# Patient Record
Sex: Female | Born: 1945 | Race: White | Hispanic: No | Marital: Married | Smoking: Former smoker
Health system: Southern US, Community
[De-identification: ages and names within clinical notes are randomized; demographics above are authoritative.]

## PROBLEM LIST (undated history)

## (undated) DIAGNOSIS — I739 Peripheral vascular disease, unspecified: Secondary | ICD-10-CM

## (undated) DIAGNOSIS — K649 Unspecified hemorrhoids: Secondary | ICD-10-CM

## (undated) DIAGNOSIS — G63 Polyneuropathy in diseases classified elsewhere: Secondary | ICD-10-CM

## (undated) DIAGNOSIS — E1140 Type 2 diabetes mellitus with diabetic neuropathy, unspecified: Secondary | ICD-10-CM

## (undated) DIAGNOSIS — T4145XA Adverse effect of unspecified anesthetic, initial encounter: Secondary | ICD-10-CM

## (undated) DIAGNOSIS — H3500 Unspecified background retinopathy: Secondary | ICD-10-CM

## (undated) DIAGNOSIS — F419 Anxiety disorder, unspecified: Secondary | ICD-10-CM

## (undated) DIAGNOSIS — G459 Transient cerebral ischemic attack, unspecified: Secondary | ICD-10-CM

## (undated) DIAGNOSIS — K449 Diaphragmatic hernia without obstruction or gangrene: Secondary | ICD-10-CM

## (undated) DIAGNOSIS — R251 Tremor, unspecified: Secondary | ICD-10-CM

## (undated) DIAGNOSIS — Z780 Asymptomatic menopausal state: Secondary | ICD-10-CM

## (undated) DIAGNOSIS — K222 Esophageal obstruction: Secondary | ICD-10-CM

## (undated) DIAGNOSIS — E119 Type 2 diabetes mellitus without complications: Secondary | ICD-10-CM

## (undated) DIAGNOSIS — K5790 Diverticulosis of intestine, part unspecified, without perforation or abscess without bleeding: Secondary | ICD-10-CM

## (undated) DIAGNOSIS — T7840XA Allergy, unspecified, initial encounter: Secondary | ICD-10-CM

## (undated) DIAGNOSIS — E785 Hyperlipidemia, unspecified: Secondary | ICD-10-CM

## (undated) DIAGNOSIS — M1990 Unspecified osteoarthritis, unspecified site: Secondary | ICD-10-CM

## (undated) DIAGNOSIS — K635 Polyp of colon: Secondary | ICD-10-CM

## (undated) DIAGNOSIS — T8859XA Other complications of anesthesia, initial encounter: Secondary | ICD-10-CM

## (undated) DIAGNOSIS — R200 Anesthesia of skin: Secondary | ICD-10-CM

## (undated) DIAGNOSIS — K219 Gastro-esophageal reflux disease without esophagitis: Secondary | ICD-10-CM

## (undated) DIAGNOSIS — F32A Depression, unspecified: Secondary | ICD-10-CM

## (undated) DIAGNOSIS — E559 Vitamin D deficiency, unspecified: Secondary | ICD-10-CM

## (undated) DIAGNOSIS — M797 Fibromyalgia: Secondary | ICD-10-CM

## (undated) DIAGNOSIS — I10 Essential (primary) hypertension: Secondary | ICD-10-CM

## (undated) DIAGNOSIS — F329 Major depressive disorder, single episode, unspecified: Secondary | ICD-10-CM

## (undated) HISTORY — DX: Anesthesia of skin: R20.0

## (undated) HISTORY — DX: Unspecified hemorrhoids: K64.9

## (undated) HISTORY — DX: Peripheral vascular disease, unspecified: I73.9

## (undated) HISTORY — DX: Diaphragmatic hernia without obstruction or gangrene: K44.9

## (undated) HISTORY — DX: Unspecified osteoarthritis, unspecified site: M19.90

## (undated) HISTORY — PX: BACK SURGERY: SHX140

## (undated) HISTORY — DX: Vitamin D deficiency, unspecified: E55.9

## (undated) HISTORY — DX: Allergy, unspecified, initial encounter: T78.40XA

## (undated) HISTORY — DX: Polyp of colon: K63.5

## (undated) HISTORY — DX: Essential (primary) hypertension: I10

## (undated) HISTORY — DX: Hyperlipidemia, unspecified: E78.5

## (undated) HISTORY — PX: COLONOSCOPY: SHX174

## (undated) HISTORY — PX: ANKLE SURGERY: SHX546

## (undated) HISTORY — DX: Depression, unspecified: F32.A

## (undated) HISTORY — PX: JOINT REPLACEMENT: SHX530

## (undated) HISTORY — DX: Gastro-esophageal reflux disease without esophagitis: K21.9

## (undated) HISTORY — DX: Asymptomatic menopausal state: Z78.0

## (undated) HISTORY — DX: Diverticulosis of intestine, part unspecified, without perforation or abscess without bleeding: K57.90

## (undated) HISTORY — DX: Other complications of anesthesia, initial encounter: T88.59XA

## (undated) HISTORY — PX: ABDOMINAL HYSTERECTOMY: SHX81

## (undated) HISTORY — DX: Transient cerebral ischemic attack, unspecified: G45.9

## (undated) HISTORY — DX: Tremor, unspecified: R25.1

## (undated) HISTORY — DX: Type 2 diabetes mellitus without complications: E11.9

## (undated) HISTORY — PX: ELBOW SURGERY: SHX618

## (undated) HISTORY — PX: TOTAL KNEE ARTHROPLASTY: SHX125

## (undated) HISTORY — DX: Fibromyalgia: M79.7

## (undated) HISTORY — PX: REPLACEMENT TOTAL KNEE: SUR1224

## (undated) HISTORY — DX: Major depressive disorder, single episode, unspecified: F32.9

## (undated) HISTORY — DX: Esophageal obstruction: K22.2

---
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
## 1979-04-07 HISTORY — PX: VESICOVAGINAL FISTULA CLOSURE W/ TAH: SUR271

## 1989-05-07 ENCOUNTER — Encounter (INDEPENDENT_AMBULATORY_CARE_PROVIDER_SITE_OTHER): Payer: Self-pay | Admitting: *Deleted

## 1994-09-21 ENCOUNTER — Encounter: Payer: Self-pay | Admitting: Internal Medicine

## 1997-02-05 ENCOUNTER — Encounter: Payer: Self-pay | Admitting: Internal Medicine

## 1997-11-06 ENCOUNTER — Ambulatory Visit (HOSPITAL_COMMUNITY): Admission: RE | Admit: 1997-11-06 | Discharge: 1997-11-06 | Payer: Self-pay | Admitting: Family Medicine

## 1998-05-23 ENCOUNTER — Encounter: Payer: Self-pay | Admitting: Internal Medicine

## 1998-05-23 ENCOUNTER — Ambulatory Visit (HOSPITAL_COMMUNITY): Admission: RE | Admit: 1998-05-23 | Discharge: 1998-05-23 | Payer: Self-pay | Admitting: Internal Medicine

## 1998-08-14 ENCOUNTER — Inpatient Hospital Stay (HOSPITAL_COMMUNITY): Admission: AD | Admit: 1998-08-14 | Discharge: 1998-08-15 | Payer: Self-pay | Admitting: Cardiology

## 1998-08-15 ENCOUNTER — Encounter: Payer: Self-pay | Admitting: Cardiology

## 1998-09-05 ENCOUNTER — Encounter (INDEPENDENT_AMBULATORY_CARE_PROVIDER_SITE_OTHER): Payer: Self-pay | Admitting: *Deleted

## 1998-09-05 ENCOUNTER — Ambulatory Visit (HOSPITAL_COMMUNITY): Admission: RE | Admit: 1998-09-05 | Discharge: 1998-09-05 | Payer: Self-pay | Admitting: Gastroenterology

## 1998-10-01 ENCOUNTER — Ambulatory Visit (HOSPITAL_COMMUNITY): Admission: RE | Admit: 1998-10-01 | Discharge: 1998-10-01 | Payer: Self-pay | Admitting: Gastroenterology

## 1998-10-14 ENCOUNTER — Encounter (INDEPENDENT_AMBULATORY_CARE_PROVIDER_SITE_OTHER): Payer: Self-pay | Admitting: *Deleted

## 1998-10-22 ENCOUNTER — Encounter (INDEPENDENT_AMBULATORY_CARE_PROVIDER_SITE_OTHER): Payer: Self-pay | Admitting: *Deleted

## 1998-11-07 ENCOUNTER — Encounter: Admission: RE | Admit: 1998-11-07 | Discharge: 1998-11-20 | Payer: Self-pay | Admitting: *Deleted

## 1999-05-21 ENCOUNTER — Encounter: Admission: RE | Admit: 1999-05-21 | Discharge: 1999-07-03 | Payer: Self-pay | Admitting: Family Medicine

## 1999-07-14 ENCOUNTER — Ambulatory Visit (HOSPITAL_COMMUNITY): Admission: RE | Admit: 1999-07-14 | Discharge: 1999-07-14 | Payer: Self-pay | Admitting: Orthopaedic Surgery

## 1999-07-14 ENCOUNTER — Encounter: Payer: Self-pay | Admitting: Orthopaedic Surgery

## 1999-11-26 ENCOUNTER — Other Ambulatory Visit: Admission: RE | Admit: 1999-11-26 | Discharge: 1999-11-26 | Payer: Self-pay | Admitting: Family Medicine

## 2000-05-04 ENCOUNTER — Emergency Department (HOSPITAL_COMMUNITY): Admission: EM | Admit: 2000-05-04 | Discharge: 2000-05-04 | Payer: Self-pay | Admitting: Emergency Medicine

## 2001-01-19 ENCOUNTER — Encounter: Admission: RE | Admit: 2001-01-19 | Discharge: 2001-04-19 | Payer: Self-pay | Admitting: Orthopaedic Surgery

## 2001-04-06 HISTORY — PX: REFRACTIVE SURGERY: SHX103

## 2002-04-17 ENCOUNTER — Encounter: Payer: Self-pay | Admitting: Internal Medicine

## 2002-11-09 ENCOUNTER — Ambulatory Visit (HOSPITAL_COMMUNITY): Admission: RE | Admit: 2002-11-09 | Discharge: 2002-11-09 | Payer: Self-pay | Admitting: Family Medicine

## 2003-01-09 ENCOUNTER — Encounter: Payer: Self-pay | Admitting: Orthopedic Surgery

## 2003-01-15 ENCOUNTER — Inpatient Hospital Stay (HOSPITAL_COMMUNITY): Admission: RE | Admit: 2003-01-15 | Discharge: 2003-01-19 | Payer: Self-pay | Admitting: Orthopedic Surgery

## 2003-02-06 ENCOUNTER — Encounter: Admission: RE | Admit: 2003-02-06 | Discharge: 2003-03-22 | Payer: Self-pay | Admitting: Orthopedic Surgery

## 2003-02-14 ENCOUNTER — Encounter (INDEPENDENT_AMBULATORY_CARE_PROVIDER_SITE_OTHER): Payer: Self-pay | Admitting: *Deleted

## 2003-03-12 ENCOUNTER — Other Ambulatory Visit: Admission: RE | Admit: 2003-03-12 | Discharge: 2003-03-12 | Payer: Self-pay | Admitting: *Deleted

## 2003-03-23 ENCOUNTER — Encounter: Admission: RE | Admit: 2003-03-23 | Discharge: 2003-03-23 | Payer: Self-pay | Admitting: Family Medicine

## 2003-05-28 ENCOUNTER — Encounter: Payer: Self-pay | Admitting: Internal Medicine

## 2003-09-27 ENCOUNTER — Emergency Department (HOSPITAL_COMMUNITY): Admission: EM | Admit: 2003-09-27 | Discharge: 2003-09-27 | Payer: Self-pay | Admitting: Emergency Medicine

## 2003-10-01 ENCOUNTER — Encounter: Payer: Self-pay | Admitting: Internal Medicine

## 2006-01-05 ENCOUNTER — Encounter: Admission: RE | Admit: 2006-01-05 | Discharge: 2006-02-04 | Payer: Self-pay | Admitting: Family Medicine

## 2006-08-09 ENCOUNTER — Encounter: Admission: RE | Admit: 2006-08-09 | Discharge: 2006-11-02 | Payer: Self-pay | Admitting: Orthopedic Surgery

## 2008-01-09 ENCOUNTER — Inpatient Hospital Stay (HOSPITAL_COMMUNITY): Admission: RE | Admit: 2008-01-09 | Discharge: 2008-01-12 | Payer: Self-pay | Admitting: Orthopedic Surgery

## 2008-01-12 ENCOUNTER — Encounter (INDEPENDENT_AMBULATORY_CARE_PROVIDER_SITE_OTHER): Payer: Self-pay | Admitting: *Deleted

## 2008-02-01 ENCOUNTER — Encounter: Admission: RE | Admit: 2008-02-01 | Discharge: 2008-02-15 | Payer: Self-pay | Admitting: Orthopedic Surgery

## 2008-05-25 ENCOUNTER — Encounter (INDEPENDENT_AMBULATORY_CARE_PROVIDER_SITE_OTHER): Payer: Self-pay | Admitting: *Deleted

## 2008-10-01 ENCOUNTER — Encounter: Admission: RE | Admit: 2008-10-01 | Discharge: 2008-12-30 | Payer: Self-pay | Admitting: Family Medicine

## 2009-08-29 ENCOUNTER — Telehealth: Payer: Self-pay | Admitting: Internal Medicine

## 2009-10-09 ENCOUNTER — Ambulatory Visit: Payer: Self-pay | Admitting: Internal Medicine

## 2009-10-09 ENCOUNTER — Telehealth: Payer: Self-pay | Admitting: Internal Medicine

## 2009-10-11 ENCOUNTER — Ambulatory Visit: Payer: Self-pay | Admitting: Internal Medicine

## 2009-10-11 DIAGNOSIS — K219 Gastro-esophageal reflux disease without esophagitis: Secondary | ICD-10-CM | POA: Insufficient documentation

## 2009-10-11 DIAGNOSIS — Z860100 Personal history of colon polyps, unspecified: Secondary | ICD-10-CM | POA: Insufficient documentation

## 2009-10-11 DIAGNOSIS — D692 Other nonthrombocytopenic purpura: Secondary | ICD-10-CM | POA: Insufficient documentation

## 2009-10-11 DIAGNOSIS — R1319 Other dysphagia: Secondary | ICD-10-CM | POA: Insufficient documentation

## 2009-10-11 LAB — CONVERTED CEMR LAB
Basophils Relative: 1.5 % (ref 0.0–3.0)
Eosinophils Absolute: 0.1 10*3/uL (ref 0.0–0.7)
Hemoglobin: 14.7 g/dL (ref 12.0–15.0)
MCHC: 34 g/dL (ref 30.0–36.0)
MCV: 94.6 fL (ref 78.0–100.0)
Monocytes Relative: 11.5 % (ref 3.0–12.0)
Neutrophils Relative %: 51.7 % (ref 43.0–77.0)
RBC: 4.55 M/uL (ref 3.87–5.11)

## 2009-10-29 ENCOUNTER — Encounter: Payer: Self-pay | Admitting: Internal Medicine

## 2009-11-27 ENCOUNTER — Ambulatory Visit: Payer: Self-pay | Admitting: Internal Medicine

## 2009-12-01 ENCOUNTER — Encounter: Payer: Self-pay | Admitting: Internal Medicine

## 2010-05-06 NOTE — Procedures (Signed)
Summary: Esophageal Manometry/[place]  Esophageal Manometry/[place]   Imported By: Sherian Rein 10/16/2009 15:17:34  _____________________________________________________________________  External Attachment:    Type:   Image     Comment:   External Document

## 2010-05-06 NOTE — Procedures (Signed)
Summary: Colonoscopy   Colonoscopy  Procedure date:  05/28/2003  Findings:      Location:  [facility].  Colon Polyp-no path specimen destroyed upon removal.  Procedures Next Due Date:    Colonoscopy: 06/2008  Colonoscopy  Procedure date:  05/28/2003  Findings:      Location:  [facility].  Colon Polyp-no path specimen destroyed upon removal.  Procedures Next Due Date:    Colonoscopy: 06/2008 Patient Name: Haas, Mary MRN:  Procedure Procedures: Colonoscopy CPT: 16109.    with polypectomy. CPT: A3573898.  Personnel: Endoscopist: Wilhemina Bonito. Marina Goodell, MD.  Exam Location: Exam performed in Outpatient Clinic. Outpatient  Patient Consent: Procedure, Alternatives, Risks and Benefits discussed, consent obtained, from patient. Consent was obtained by the RN.  Indications  Average Risk Screening Routine.  History  Current Medications: Patient is not currently taking Coumadin.  Pre-Exam Physical: Performed May 28, 2003. Entire physical exam was normal.  Exam Exam: Extent of exam reached: Cecum, extent intended: Cecum.  The cecum was identified by appendiceal orifice and IC valve. Patient position: on left side. Colon retroflexion performed. Images taken. ASA Classification: II. Tolerance: excellent.  Monitoring: Pulse and BP monitoring, Oximetry used. Supplemental O2 given.  Colon Prep Used MIRALAX for colon prep. Prep results: excellent.  Sedation Meds: Patient assessed and found to be appropriate for moderate (conscious) sedation. Fentanyl 150 mcg. given IV. Versed 15 given IV.  Findings NORMAL EXAM: Cecum to Rectum.  POLYP: Sigmoid Colon, diminutive, sessile polyp. Procedure:  snare without cautery, removed, ICD9: Colon Polyps: 211.3. Comments: POLYP DESTROYED UPON REMOVAL.   Assessment Abnormal examination, see findings above.  Diagnoses: 211.3: Colon Polyps.   Events  Unplanned Interventions: No intervention was required.    Unplanned Events: There were no complications. Plans Disposition: After procedure patient sent to recovery. After recovery patient sent home.  Scheduling/Referral: Colonoscopy, to Wilhemina Bonito. Marina Goodell, MD, IN ABOUT 5 YEARS,    This report was created from the original endoscopy report, which was reviewed and signed by the above listed endoscopist.   cc:  Rudi Heap, MD      The Patient

## 2010-05-06 NOTE — Procedures (Signed)
Summary: Colonoscopy  Patient: [name] Note: All result statuses are Final unless otherwise noted.  Tests: (1) Colonoscopy (COL)   COL Colonoscopy           DONE     [facility]     [address]     [place], [place]  [address]           COLONOSCOPY PROCEDURE REPORT           PATIENT:  [name], [name]  MR#:  [id number]     BIRTHDATE:  July 16, 1945, 64 yrs. old  GENDER:  female     ENDOSCOPIST:  Wilhemina Bonito. Eda Keys, MD     REF. BY:  Surveillance Program Recall,     PROCEDURE DATE:  11/27/2009     PROCEDURE:  Colonoscopy with snare polypectomy x 6     ASA CLASS:  Class II     INDICATIONS:  history of polyps, surveillance and high-risk     screening ; index exam 2005 (NAP)     MEDICATIONS:   Fentanyl 100 mcg IV, Versed 10 mg IV, Benadryl 50     mg IV           DESCRIPTION OF PROCEDURE:   After the risks benefits and     alternatives of the procedure were thoroughly explained, informed     consent was obtained.  Digital rectal exam was performed and     revealed no abnormalities.   The LB [id number]-[id number] endoscope     was introduced through the anus and advanced to the cecum, which     was identified by both the appendix and ileocecal valve, without     limitations.Time to cecum = 2:68min.  The quality of the prep was     excellent, using MiraLax.  The instrument was then slowly     withdrawn (time = 25:51 min) as the colon was fully examined.     <<PROCEDUREIMAGES>>           FINDINGS:  There were multiple (6), all < 6mm,  polyps identified     and removed in the cecum (3) and ascending colon (3). Polyps were     snared without cautery. Retrieval was successful.  This was     otherwise a normal examination of the colon.   Retroflexed views     in the rectum revealed internal hemorrhoids.    The scope was then     withdrawn from the patient and the procedure completed.           COMPLICATIONS:  None           ENDOSCOPIC IMPRESSION:     1) Polyps, multiple (6) -  removed     2) Otherwise normal examination     3) Internal hemorrhoids           RECOMMENDATIONS:     1) Follow up colonoscopy in 3 years           ______________________________     Wilhemina Bonito. Eda Keys, MD           CC:  Rudi Heap, MD;The Patient           n.     Rosalie DoctorWilhemina Bonito. Eda Keys at 11/27/2009 11:01 AM           Ashok Norris, [id number]  Note: An exclamation mark (!) indicates a result that was not dispersed into the flowsheet. Document Creation Date: 11/27/2009 12:20  PM _______________________________________________________________________  (1) Order result status: Final Collection or observation date-time: 11/27/2009 10:50 Requested date-time:  Receipt date-time:  Reported date-time:  Referring Physician:   Ordering Physician: Fransico Setters [phone]) Specimen Source:  Source: Launa Grill Order Number: [id number] Lab site:   Appended Document: Colonoscopy recall     Procedures Next Due Date:    Colonoscopy: 12/2012

## 2010-05-06 NOTE — Discharge Summary (Signed)
Summary: Discharge Summary    NAME:  [name], [name] NO.:  [id number]   MEDICAL RECORD NO.:  [id number]                   PATIENT TYPE:  INP   LOCATION:  0455                                 FACILITY:  [facility]   PHYSICIAN:  Ollen Gross, M.D.                 DATE OF BIRTH:  Apr 21, 1945   DATE OF ADMISSION:  01/15/2003  DATE OF DISCHARGE:  01/19/2003                                 DISCHARGE SUMMARY   ADMITTING DIAGNOSES:  1. Osteoarthritis right knee.  2. Depression.  3. History of bronchitis.  4. Hypertension.  5. Hiatal hernia.  6. Reflux disease.  7. Hemorrhoids.  8. Irritable bowel syndrome.  9. Cervical degenerative disk disease.  10.      History of borderline diabetes currently under work-up.  11.      Surgical menopause.  12.      Endometriosis.   DISCHARGE DIAGNOSES:  1. Osteoarthritis right knee status post right total knee arthroplasty.  2. Mild postoperative blood loss anemia.  3. Hyponatremia.  4. Depression.  5. History of bronchitis.  6. Hypertension.  7. Hiatal hernia.  8. Reflux disease.  9. Hemorrhoids.  10.      Irritable bowel syndrome.  11.      Cervical degenerative disk disease.  12.      History of borderline diabetes currently under work-up.  13.      Surgical menopause.  14.      Endometriosis.   PROCEDURE:  The patient was taken to the OR on January 15, 2003.  Underwent  a right total knee arthroplasty.  Surgeon Ollen Gross, M.D.  Assistant  Alexzandrew L. Julien Girt, P.A.  Anesthesia spinal.  Minimal blood loss.  Hemovac drain x1.  Tourniquet time 52 minutes at 300 mmHg.   CONSULTS:  None.   BRIEF HISTORY:  The patient is a 65 year old female with a significant end-  stage arthritis of the right knee.  This has been refractory to nonoperative  management in the past.  It is felt she has reached a point where she would  benefit from undergoing knee replacement.  Risks and benefits discussed.  The patient is  subsequently admitted to the hospital.   LABORATORY DATA:  CBC on admission showed a hemoglobin of 15.1, hematocrit  of 43.1, white cell count normal at 7.8, red cell count 4.78.  Differential  all within normal limits.  Postoperative H&H 12.7 and 37.3.  Last noted H&H  11.1 and 32.3.  PT/PTT on admission were 11.9 and 29, respectively with INR  0.8.  Serial pro times followed.  Last noted PT/INR 15.4 and 1.4.  Chemistry  panel on admission showed mild low potassium at 3.4, elevated glucose of  172, minimally elevated ALT of 42.  Remaining liver functions, ALP, AST all  within normal limits.  Serial BMETs were followed.  Potassium came back up  to 4.1.  Sodium did drop a little bit from 138 down to 131.  Glucose  improved down to 145.  Urinalysis on admission negative.  Blood group type  A+.  Chest x-ray dated January 09, 2003:  No evidence of active disease in  the chest.  There is an EKG report on the chart dated January 19, 2002.  Ventricular rate 64, within normal limits.  It was sent over from [facility].   HOSPITAL COURSE:  The patient was admitted to [facility], taken to  OR.  Underwent above stated procedure without complication.  The patient  tolerated procedure well.  Later transferred to recovery room and then to  the orthopedic floor for continued postoperative care.  Vital signs were  followed.  The patient was given 24 hours of postoperative IV Ancef,  Coumadin for DVT prophylaxis.  Placed on PCA and p.o. analgesia for pain  control following surgery.  PT and OT were consulted to assist with gait  training, ambulation, ADLs.  Placed weightbearing as tolerated.  Hemovac  drain placed at time of surgery was pulled on postoperative day one without  difficulty.  She was found to have a pillow under the knee on postoperative  day one.  Reiterated to the staff that she was not to have a pillow under  the knee, that she may have it under the foot  for elevation, but not under  the knee to prevent flexion contracture.  Laboratories were followed very  closely.  She was encouraged to take p.o. medications along with PCA.  By  day two she had had a rough night, but was feeling better the next day.  She  was weaned over to p.o. medications and the PCA and IVs were discontinued  along with Foley.  From a therapy standpoint patient was starting to show  progress with her PT.  She had been up ambulating 40 feet on day one and  then increased up to 200 feet by day two with 280 feet by day three.  She  did very well with her physical therapy.  Dressing change initiated on  postoperative day two.  Incision was healing well.  She continued to  progress well.  It was felt she would be going home later that week.  By  January 19, 2003 she had been up ambulating well with physical therapy,  tolerating her p.o. medications, was seen in rounds by Ollen Gross, M.D.  who decided patient would be discharged home at that time.   DISCHARGE PLAN:  The patient discharged home on January 19, 2003.   DISCHARGE DIAGNOSES:  Please see above.   DISCHARGE MEDICATIONS:  1. Percocet.  2. Robaxin.  3. Restoril.  4. Coumadin.   DIET:  As tolerated.   ACTIVITY:  Gait training, ambulation.  Home health PT and home health  nursing.  Weightbearing as tolerated.   FOLLOWUP:  Two weeks from surgery.    DISPOSITION:  Home.   CONDITION ON DISCHARGE:  Improved.     Alexzandrew L. Julien Girt, P.A.              Ollen Gross, M.D.    ALP/MEDQ  D:  02/14/2003  T:  02/14/2003  Job:  [id number]   cc:   Ernestina Penna, M.D.  [address] [place]  [place] [address]  Fax: [phone]   Magnus Sinning. Dimple Casey, M.D.  [address] [place]  [place] [address]  Fax: [phone]

## 2010-05-06 NOTE — Procedures (Signed)
Summary: [facility]   Imported By: Sherian Rein 10/16/2009 15:15:10  _____________________________________________________________________  External Attachment:    Type:   Image     Comment:   External Document

## 2010-05-06 NOTE — Procedures (Signed)
Summary: EGD/[facility]  EGD/[facility]   Imported By: Sherian Rein 10/16/2009 15:14:08  _____________________________________________________________________  External Attachment:    Type:   Image     Comment:   External Document

## 2010-05-06 NOTE — Letter (Signed)
Summary: [facility] Instructions  [facility]  [address] [place], [place] [address]   Phone: [phone]  Fax: [phone]       [name]    1946-01-13    MRN: [id number]        Procedure Day Dorna Bloom: [name], 11/27/09     Arrival Time:8:00 AM     Procedure Time: 9:30 AM    Location of Procedure:                    [facility] (4th Floor)   PREPARATION FOR COLONOSCOPY WITH MOVIPREP   Starting 5 days prior to your procedure 8/20/11do not eat nuts, seeds, popcorn, corn, beans, peas,  salads, or any raw vegetables.  Do not take any fiber supplements (e.g. Metamucil, Citrucel, and Benefiber).  THE DAY BEFORE YOUR PROCEDURE         DATE: 11/26/09  DAY: MONDAY  1.  Drink clear liquids the entire day-NO SOLID FOOD  2.  Do not drink anything colored red or purple.  Avoid juices with pulp.  No orange juice.  3.  Drink at least 64 oz. (8 glasses) of fluid/clear liquids during the day to prevent dehydration and help the prep work efficiently.  CLEAR LIQUIDS INCLUDE: Water Jello Ice Popsicles Tea (sugar ok, no milk/cream) Powdered fruit flavored drinks Coffee (sugar ok, no milk/cream) Gatorade Juice: apple, white grape, white cranberry  Lemonade Clear bullion, consomm, broth Carbonated beverages (any kind) Strained chicken noodle soup Hard Candy                             4.  In the morning, mix first dose of MoviPrep solution:    Empty 1 Pouch A and 1 Pouch B into the disposable container    Add lukewarm drinking water to the top line of the container. Mix to dissolve    Refrigerate (mixed solution should be used within 24 hrs)  5.  Begin drinking the prep at 5:00 p.m. The MoviPrep container is divided by 4 marks.   Every 15 minutes drink the solution down to the next mark (approximately 8 oz) until the full liter is complete.   6.  Follow completed prep with 16 oz of clear liquid of your choice (Nothing red or purple).  Continue to  drink clear liquids until bedtime.  7.  Before going to bed, mix second dose of MoviPrep solution:    Empty 1 Pouch A and 1 Pouch B into the disposable container    Add lukewarm drinking water to the top line of the container. Mix to dissolve    Refrigerate  THE DAY OF YOUR PROCEDURE      DATE: 11/27/09 DAY: TUESDAY  Beginning at 4:30 a.m. (5 hours before procedure):         1. Every 15 minutes, drink the solution down to the next mark (approx 8 oz) until the full liter is complete.  2. Follow completed prep with 16 oz. of clear liquid of your choice.    3. You may drink clear liquids until 7:30 AM (2 HOURS BEFORE PROCEDURE).   MEDICATION INSTRUCTIONS  Unless otherwise instructed, you should take regular prescription medications with a small sip of water   as early as possible the morning of your procedure.           OTHER INSTRUCTIONS  You will need a responsible adult at least 65 years of  age to accompany you and drive you home.   This person must remain in the waiting room during your procedure.  Wear loose fitting clothing that is easily removed.  Leave jewelry and other valuables at home.  However, you may wish to bring a book to read or  an iPod/MP3 player to listen to music as you wait for your procedure to start.  Remove all body piercing jewelry and leave at home.  Total time from sign-in until discharge is approximately 2-3 hours.  You should go home directly after your procedure and rest.  You can resume normal activities the  day after your procedure.  The day of your procedure you should not:   Drive   Make legal decisions   Operate machinery   Drink alcohol   Return to work  You will receive specific instructions about eating, activities and medications before you leave.    The above instructions have been reviewed and explained to me by   _______________________    I fully understand and can verbalize these instructions  _____________________________ Date _________

## 2010-05-06 NOTE — Procedures (Signed)
Summary: Upper Endoscopy  Patient: [name] Note: All result statuses are Final unless otherwise noted.  Tests: (1) Upper Endoscopy (EGD)   EGD Upper Endoscopy       DONE (C)     [facility]     [address]     [place], [place]  [address]           ENDOSCOPY PROCEDURE REPORT           PATIENT:  [name], [name]  MR#:  [id number]     BIRTHDATE:  04-08-1945, 64 yrs. old  GENDER:  female           ENDOSCOPIST:  Wilhemina Bonito. Eda Keys, MD     Referred by:  Office           PROCEDURE DATE:  11/27/2009     PROCEDURE:  EGD, diagnostic,     Maloney Dilation of Esophagus - 32     F     ASA CLASS:  Class II     INDICATIONS:  dysphagia           MEDICATIONS:   There was residual sedation effect present from     prior procedure., Fentanyl 25 mcg IV, Versed 4 mg IV     TOPICAL ANESTHETIC:  Exactacain Spray           DESCRIPTION OF PROCEDURE:   After the risks benefits and     alternatives of the procedure were thoroughly explained, informed     consent was obtained.  The [facility] [id number] [id number] endoscope was     introduced through the mouth and advanced to the second portion of     the duodenum, without limitations.  The instrument was slowly     withdrawn as the mucosa was fully examined.     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     The esophagus was otherwise normal. The stomach was entered and     closely examined. The antrum, angularis, and lesser curvature were     well visualized, including a retroflexed view of the cardia and     fundus. The stomach wall was normally distensable. Afew small     incidental fundic gland type polyps were present. The scope passed     easily through the pylorus into the duodenum.  The duodenal bulb     was normal in appearance, as was the postbulbar duodenum.     Retroflexed views revealed a small hiatal hernia.    The scope was     then withdrawn from the patient and the procedure  completed.           THERAPY: 46F MALONEY DILATOR PASSED W/O RESISTANCE OR HEME.     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 Resume     prior diet tomorrow.     2) Continue current medication for GERD     3) GI follow up prn  ______________________________     Wilhemina Bonito. Eda Keys, MD           CC:  Rudi Heap, MD, The Patient           n.     REVISED:  11/27/2009 01:02 PM     eSIGNED:   Wilhemina Bonito. Eda Keys at 11/27/2009 01:02 PM           Ashok Norris, [id number]  Note: An exclamation mark (!) indicates a result that was not dispersed into the flowsheet. Document Creation Date: 11/27/2009 1:03 PM _______________________________________________________________________  (1) Order result status: Final Collection or observation date-time: 11/27/2009 11:06 Requested date-time:  Receipt date-time:  Reported date-time:  Referring Physician:   Ordering Physician: Fransico Setters [id number]) Specimen Source:  Source: Launa Grill Order Number: [id number] Lab site:

## 2010-05-06 NOTE — Discharge Summary (Signed)
Summary: Discharge Summary  NAME:  [name], [name]                 ACCOUNT NO.:  [id number]      MEDICAL RECORD NO.:  [id number]          PATIENT TYPE:  INP      LOCATION:  1609                         FACILITY:  [facility]      PHYSICIAN:  Ollen Gross, M.D.    DATE OF BIRTH:  [birth year]      DATE OF ADMISSION:  01/09/2008   DATE OF DISCHARGE:  01/12/2008                                  DISCHARGE SUMMARY      ADMITTING DIAGNOSES:   1. Osteoarthritis left knee.   2. Anxiety.   3. Depression.   4. History of bronchitis.   5. Hypertension.   6. Hiatal hernia.   7. Reflux disease.   8. Hypercholesterolemia.   9. History of hemorrhoids.   10.Irritable bowel syndrome.   11.Cervical degenerative disk disease.   12.Fibromyalgia.   13.Postmenopausal.   14.Past history of endometriosis.   15.Childhood illnesses of measles and mumps.      DISCHARGE DIAGNOSES:   1. Osteoarthritis left knee status post left total knee replacement       arthroplasty.   2. Mild postop hyponatremia improved.   3. Anxiety.   4. Depression.   5. History of bronchitis.   6. Hypertension.   7. Hiatal hernia.   8. Reflux disease.   9. Hypercholesterolemia.   10.History of hemorrhoids.   11.Irritable bowel syndrome.   12.Cervical degenerative disk disease.   13.Fibromyalgia.   14.Postmenopausal.   15.Past history of endometriosis.   16.Childhood illnesses of measles and mumps.      PROCEDURE:  January 09, 2008, left total knee.      SURGEON:  Dr. Lequita Halt.  Assistant:  Avel Peace PA-C.  Anesthesia:   Spinal with Duramorph.      CONSULTS:  None.      BRIEF HISTORY:  [name] is a 65 year old female with end-stage   arthritis of the left knee with progressive worsening pain dysfunction.   She has failed nonoperative management, now presents for a total knee   arthroplasty.      LABORATORY DATA:  Preop CBC showed hemoglobin of 14.9, hematocrit 43.9,   white cell count 9.1, platelets 245.  Chem panel on  admission had blood   glucose of 174.  Remaining Chem panel within normal limits.  PT/INR 11.6   and 0.8 with a PTT of 24.  Preop UA negative.  Serial CBCs were   followed.  Postop hemoglobin was down to 12.4 then 11.4.  Last H and H   was 11.0 and 31.8.  Serial pro-times followed.  Last PT/INR 17.4, 1.4.   Serial BMETs were followed.  Sodium did drop down to 133, came back to   136.  Remaining electrolytes remained within normal limits.      Chest x-ray:  January 04, 2008, no active cardiopulmonary disease.      EKG January 04, 2008, normal sinus rhythm, nonspecific T-wave   abnormality.  Since last tracing rate was faster confirmed by Dr.   Sharyn Lull.      HOSPITAL COURSE:  The patient admitted to [facility],   tolerated procedure well, later to the recovery room orthopedic floor,   started on PCA and p.o. analgesic pain control following surgery, given   24 hours postop IV antibiotics.  Was not able to get much rest through   the night due to pain.  Pain was a little bit better by the morning.   Did not the Percocet, so we switched over Vicodin.  Blood pressure   medications were on hold with parameters but they were resumed   postoperatively given her Nexium for her reflux.  Started getting up out   of bed on day 1.  By day 2 she was doing better and walking about 70-75   feet.  Dressing was change, incision looked good.  Hemoglobin was stable   11.4.  Her blood pressure had started creeping up.  The lisinopril was   on hold initially postoperatively but we resumed it since her blood   pressure started coming back up to 140s.  It was back down and well-   controlled; back down 130s.  She continued to progress well and by the   morning of January 12, 2008, she was doing well, tolerating her meds and   was discharged home.      DISCHARGE PLAN:   1. Discharged home on January 12, 2008.   2. Discharge diagnoses, please see above.   3. Discharge meds, Norco 10, Robaxin,  Coumadin and Lovenox for 1 more       ejection at home.      DIET:  Low-sodium, low-cholesterol diet.      ACTIVITY:  Weightbearing as tolerated, total knee protocol.  Home health   PT, home health nursing.      FOLLOWUP:  Two weeks.      DISPOSITION:  Home.      CONDITION UPON DISCHARGE:  Improved.               Alexzandrew L. Perkins, P.A.C.               Ollen Gross, M.D.   Electronically Signed         ALP/MEDQ  D:  01/12/2008  T:  01/12/2008  Job:  [id number]      cc:   Ollen Gross, M.D.   Fax: [phone]      Ernestina Penna, M.D.   Fax: [phone]

## 2010-05-06 NOTE — Procedures (Signed)
Summary: [place]  [place]   Imported By: Sherian Rein 10/16/2009 15:18:40  _____________________________________________________________________  External Attachment:    Type:   Image     Comment:   External Document

## 2010-05-06 NOTE — Procedures (Signed)
Summary: Upper Endoscopy/[place]  Upper Endoscopy/[place]   Imported By: Sherian Rein 10/16/2009 15:16:14  _____________________________________________________________________  External Attachment:    Type:   Image     Comment:   External Document

## 2010-05-06 NOTE — Procedures (Signed)
Summary: EGD   EGD  Procedure date:  10/01/2003  Findings:      Location:  [facility].  Findings: Stricture:  GERD  EGD  Procedure date:  10/01/2003  Findings:      Location:  [facility].  Findings: Stricture:  GERD Patient Name: Mary, Haas MRN:  Procedure Procedures: Panendoscopy (EGD) CPT: 43235.    with esophageal dilation. CPT: G9296129.  Personnel: Endoscopist: Wilhemina Bonito. Marina Goodell, MD.  Exam Location: Exam performed in Outpatient Clinic. Outpatient  Patient Consent: Procedure, Alternatives, Risks and Benefits discussed, consent obtained, from patient. Consent was obtained by the RN.  Indications Symptoms: Dysphagia. Reflux symptoms  History  Current Medications: Patient is not currently taking Coumadin.  Pre-Exam Physical: Performed Oct 01, 2003  Entire physical exam was normal.  Exam Exam Info: Maximum depth of insertion Duodenum, intended Duodenum. Patient position: on left side. Vocal cords visualized. Gastric retroflexion performed. Images taken. ASA Classification: II. Tolerance: excellent.  Sedation Meds: Patient assessed and found to be appropriate for moderate (conscious) sedation. Fentanyl 100 mcg. given IV. Versed 10 mg. given IV. Cetacaine Spray given aerosolized.  Monitoring: BP and pulse monitoring done. Oximetry used. Supplemental O2 given  Fluoroscopy: Fluoroscopy was not used.  Findings HIATAL HERNIA:  STRICTURE / STENOSIS: Stricture in Distal Esophagus.  Constriction: partial. Etiology: benign due to reflux. 39 cm from mouth. Lumen diameter is 15 mm. ICD9: Esophageal Stricture: 530.3.  - Dilation: Distal Esophagus. Procedure was performed under Fluoroscopy. Maloney dilator used, Diameter: 54 F, No Resistance, No Heme present on extraction. 1  total dilators used. Patient tolerance excellent.   Assessment Abnormal examination, see findings above.  Diagnoses: 530.3: Esophageal Stricture.  530.81: GERD.    Events  Unplanned Intervention: No unplanned interventions were required.  Unplanned Events: There were no complications. Plans Instructions: Nothing to eat or drink for 1 hr.  Clear or full liquids: 2 hrs. Resume previous diet: am.  Medication(s): Continue current medications.  Disposition: After procedure patient sent to recovery. After recovery patient sent home.  Scheduling: Follow-up prn.   This report was created from the original endoscopy report, which was reviewed and signed by the above listed endoscopist.   cc:  Rudi Heap, MD      The Patient

## 2010-05-06 NOTE — Letter (Signed)
Summary: Patient Notice- Polyp Results  [facility]  [address] [place], [place] [address]   Phone: [phone]  Fax: [phone]        December 01, 2009 MRN: [id number]    [name] [address] [place], [place]  [address]    Dear Ms. Gebert,  I am pleased to inform you that the colon polyp(s) removed during your recent colonoscopy was (were) found to be benign (no cancer detected) upon pathologic examination.  I recommend you have a repeat colonoscopy examination in 3 years to look for recurrent polyps, as having colon polyps increases your risk for having recurrent polyps or even colon cancer in the future.  Should you develop new or worsening symptoms of abdominal pain, bowel habit changes or bleeding from the rectum or bowels, please schedule an evaluation with either your primary care physician or with me.  Additional information/recommendations:  __ No further action with gastroenterology is needed at this time. Please      follow-up with your primary care physician for your other healthcare      needs.    Please call us if you are having persistent problems or have questions about your condition that have not been fully answered at this time.  Sincerely,  Hilarie Fredrickson MD  This letter has been electronically signed by your physician.  Appended Document: Patient Notice- Polyp Results letter mailed

## 2010-05-06 NOTE — Assessment & Plan Note (Signed)
Summary: dysphagia / colonoscopy   History of Present Illness Visit Type: Initial Visit Primary GI MD: Yancey Flemings MD Primary Provider: Rudi Heap, MD Chief Complaint: dysphagia x 2-3 months History of Present Illness:   65 year old female with history of GERD, irritable bowel syndrome, fibromyalgia, anxiety/depression, chronic chest pain, hemorrhoids, hypertension, and arthritis. She presents today with chief complaint of dysphagia. Patient does have a history of peptic stricture for which she underwent esophageal dilation in June of 2005 with a 54 [place] Maloney dilator. She has been maintained chronically on Nexium. She was doing well until about 3 months ago when she began to develop intermittent solid food dysphagia. This has persisted and worsened. She denies active reflux symptoms such as pyrosis or regurgitation. She does mention belching and bloating. Next, she is due for surveillance colonoscopy. Initial colonoscopy in June of 2005 revealed a diminutive colon polyp which was destroyed, thus no pathology available. Followup at this time recommended. She continues with irritable bowel complaints that include bloating as well as alternating bowel habits. No real change.. She does complain to me of easy bruisability over the past several months. This is new. She has an appointment with a dermatologist. No other workup to date. She is not on prednisone or anticoagulants or antiplatelet agents.   GI Review of Systems    Reports belching, bloating, and  dysphagia with solids.      Denies abdominal pain, chest pain, dysphagia with liquids, heartburn, loss of appetite, nausea, vomiting, vomiting blood, weight loss, and  weight gain.      Reports constipation, diarrhea, and  irritable bowel syndrome.     Denies anal fissure, black tarry stools, change in bowel habit, diverticulosis, fecal incontinence, heme positive stool, hemorrhoids, jaundice, light color stool, liver problems, rectal bleeding,  and  rectal pain. Preventive Screening-Counseling & Management  Alcohol-Tobacco     Smoking Status: quit      Drug Use:  no.      Current Medications (verified): 1)  Gabapentin 600 Mg Tabs (Gabapentin) .Marland Kitchen.. 1 By Mouth Two Times A Day 2)  Hydrocodone-Acetaminophen 7.5-325 Mg Tabs (Hydrocodone-Acetaminophen) .Marland Kitchen.. 1 By Mouth Q 6 Hours As Needed Pain 3)  Alprazolam 0.5 Mg Tabs (Alprazolam) .Marland Kitchen.. 1 By Mouth At Bedtime 4)  Nexium 40 Mg Cpdr (Esomeprazole Magnesium) .Marland Kitchen.. 1 By Mouth Once Daily 5)  Zetia 10 Mg Tabs (Ezetimibe) .Marland Kitchen.. 1 By Mouth Once Daily 6)  Abilify 2 Mg Tabs (Aripiprazole) .Marland Kitchen.. 1 By Mouth Once Daily 7)  Antara 130 Mg Caps (Fenofibrate Micronized) .Marland Kitchen.. 1 By Mouth Once Daily 8)  Lisinopril-Hydrochlorothiazide 20-25 Mg Tabs (Lisinopril-Hydrochlorothiazide) .Marland Kitchen.. 1 By Mouth Once Daily 9)  Lexapro 20 Mg Tabs (Escitalopram Oxalate) .Marland Kitchen.. 1 By Mouth Once Daily 10)  Fish Oil 1000 Mg Caps (Omega-3 Fatty Acids) .... 2 Tablets By Mouth Once Daily 11)  Multivitamins  Tabs (Multiple Vitamin) .Marland Kitchen.. 1 By Mouth Once Daily  Allergies (verified): 1)  ! Penicillin 2)  ! Erythromycin 3)  ! Codeine  Past History:  Past Medical History: Fibromyalgia Hypertension GERD Colon Polyps Anxiety Disorder Arthritis Depression  Past Surgical History: Reviewed history from 10/11/2009 and no changes required. Rt. Elbow BKR Rt. Ankle and Lt. Ankle Surgery Hysterectomy  Family History: Family History of Diabetes: Mother Family History of Irritable Bowel Syndrome:Mother No FH of Colon Cancer:  Social History: Occupation: disabled Patient is a former smoker. 2 weeks ago Alcohol Use - no Daily Caffeine Use Illicit Drug Use - no Smoking Status:  quit Drug Use:  no  Review of Systems       The patient complains of allergy/sinus, anxiety-new, arthritis/joint pain, back pain, depression-new, fatigue, night sweats, skin rash, sleeping problems, and swelling of feet/legs.  The patient denies  anemia, blood in urine, breast changes/lumps, change in vision, confusion, cough, coughing up blood, fainting, fever, headaches-new, hearing problems, heart murmur, heart rhythm changes, itching, menstrual pain, muscle pains/cramps, nosebleeds, pregnancy symptoms, shortness of breath, sore throat, swollen lymph glands, thirst - excessive , urination - excessive , urination changes/pain, urine leakage, vision changes, and voice change.    Vital Signs:  Patient profile:   65 year old female Height:      66 inches Weight:      223.38 pounds BMI:     36.18 Pulse rate:   64 / minute Pulse rhythm:   regular BP sitting:   118 / 68  (left arm) Cuff size:   regular  Vitals Entered By: June McMurray CMA Duncan Dull) (October 11, 2009 2:35 PM)  Physical Exam  General:  Well developed, well nourished, no acute distress. Head:  Normocephalic and atraumatic. Eyes:  PERRLA, no icterus. Mouth:  No deformity or lesions. Loose tooth in the front upper teeth. Neck:  Supple; no masses or thyromegaly. Lungs:  Clear throughout to auscultation. Heart:  Regular rate and rhythm; no murmurs, rubs,  or bruits. Abdomen:  Soft, nontender and nondistended. No masses, hepatosplenomegaly or hernias noted. Normal bowel sounds. Msk:  Symmetrical with no gross deformities. Normal posture. Pulses:  Normal pulses noted. Extremities:  no edema Neurologic:  alert and oriented Skin:  ecchymoses on the arms and legs Psych:  Alert and cooperative. Normal mood and affect.   Impression & Recommendations:  Problem # 1:  DYSPHAGIA (ICD-787.29) 3 month history of intermittent solid food dysphagia. Likely due to recurrent peptic stricture.  Plan: #1. Upper endoscopy with esophageal dilation. The nature of the procedure as well as the risks, benefits, and alternatives were reviewed. She understood and agreed to proceed  Problem # 2:  GERD (ICD-530.81) chronic GERD. No classic GERD symptoms. Only recurrent dysphagia as outlined  above.  Plan:  #1. Reflux precautions with attention to weight loss #2. Continue PPI  Problem # 3:  PERSONAL HISTORY OF COLONIC POLYPS (ICD-V12.72) personal history of colon polyp. Type unknown. Due for followup surveillance.  Plan: #1. Colonoscopy. The nature of the procedure as well as the risks, benefits, and alternatives were reviewed. She understood and agreed to proceed #2. Movi prep prescribed. 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  Plan:  #1. Obtain CBC with platelets to rule out thrombocytopenia #2. His platelets okay, keep her appointment with dermatologist  Other Orders: Colon/Endo (Colon/Endo) TLB-CBC Platelet - w/Differential (85025-[id number])  Patient Instructions: 1)  CBC ordered for patient to have drawn today. 2)  Colon/Endo [facility] 11/27/09 9:30 am 3)  Movi prep Rx. sent to pharmacy. 4)  Colonoscopy and Flexible Sigmoidoscopy brochure given.  5)  Upper Endoscopy brochure given.  6)  The medication list was reviewed and reconciled.  All changed / newly prescribed medications were explained.  A complete medication list was provided to the patient / caregiver. 7)  Copy: Dr. Rudi Heap Prescriptions: MOVIPREP 100 GM  SOLR (PEG-KCL-NACL-NASULF-NA ASC-C) As per prep instructions.  #1 x 0   Entered by:   Milford Cage NCMA   Authorized by:   Hilarie Fredrickson MD   Signed by:  Milford Cage NCMA on 10/11/2009   Method used:   Electronically to        [facility] [phone]* (retail)       [address]       [place], [place]  [address]       Ph: [phone] or [phone]       Fax: [phone]   RxID:   [id number]

## 2010-05-06 NOTE — Progress Notes (Signed)
Summary: Schedule Colonoscopy  Phone Note Outgoing Call Call back at [facility] Phone [phone]   Call placed by: Harlow Mares CMA Duncan Dull),  Aug 29, 2009 10:09 AM Call placed to: Patient Summary of Call: Left message on patients machine to call back. patient is due for colonoscopy Initial call taken by: Harlow Mares CMA Duncan Dull),  Aug 29, 2009 10:10 AM  Follow-up for Phone Call        patient scheduled for colon on 10/23/2009. Follow-up by: Harlow Mares CMA Duncan Dull),  Aug 29, 2009 2:40 PM

## 2010-05-06 NOTE — Procedures (Signed)
Summary: Upper Endoscopy/[facility]  Upper Endoscopy/[facility]   Imported By: Sherian Rein 10/16/2009 15:12:31  _____________________________________________________________________  External Attachment:    Type:   Image     Comment:   External Document

## 2010-05-06 NOTE — Miscellaneous (Signed)
Summary: [facility] PV/MAY NEED PROPOFOL  Clinical Lists Changes   While in PV pt says that her food is getting stuck and she is having trouble swallowing; she would like to have EGD and colon on the ssme day.  Pt says that she woke up during last colonoscopy and it hurt.  Was given Fentanyl 150 micrograms and Versed 15 mg. for procedure.  Since that time has added to meds she is taking Hydrocodone, Alprazolam, Abilify, and Lexapro. Colonoscopy scheduled for 10/23/09 cancelled and OV scheduled with Dr. Marina Goodell.

## 2010-05-06 NOTE — Progress Notes (Signed)
Summary: Triage / sooner appointment  Phone Note Call from Patient Call back at Home Phone [phone]   Caller: Patient Call For: Dr. Marina Goodell Reason for Call: Talk to Nurse Summary of Call: pt. was in office for pre-visit and is having problems swallowing...dysphagia, may need Endo/COL at hosp.Marland Kitchen Requesting sooner appt. than next avail. for an OV. Initial call taken by: Karna Christmas,  October 09, 2009 1:24 PM  Follow-up for Phone Call        message left to call back given ov for this Friday with Dr.Perry  Teryl Lucy RN  October 09, 2009 1:47 PM  Follow-up by: Hilarie Fredrickson MD,  October 09, 2009 2:49 PM  Additional Follow-up for Phone Call Additional follow up Details #1::        Pt. aware of appt. chg. to tommorrow. Additional Follow-up by: Teryl Lucy RN,  October 10, 2009 8:12 AM

## 2010-05-06 NOTE — Procedures (Signed)
Summary: EGD   EGD  Procedure date:  04/17/2002  Findings:      Findings: Normal  Location: [facility]    EGD  Procedure date:  04/17/2002  Findings:      Findings: Normal  Location: [facility]   Patient Name: [name], [name] MRN:  Procedure Procedures: Panendoscopy (EGD) CPT: 43235.    with esophageal dilation. CPT: G9296129.  Personnel: Endoscopist: Wilhemina Bonito. Marina Goodell, MD.  Referred By: Gaynelle Cage, MD.  Exam Location: Exam performed in Outpatient Clinic. Outpatient  Patient Consent: Procedure, Alternatives, Risks and Benefits discussed, consent obtained, from patient. Consent was obtained by the RN.  Indications Symptoms: Dysphagia. Chest Pain.  History  Pre-Exam Physical: Performed Apr 17, 2002  Entire physical exam was normal.  Exam Exam Info: Maximum depth of insertion Duodenum, intended Duodenum. Patient position: on left side. Vocal cords visualized. Gastric retroflexion performed. Images taken. ASA Classification: II. Tolerance: excellent.  Sedation Meds: Patient assessed and found to be appropriate for moderate (conscious) sedation. Fentanyl 100 mcg. given IV. Versed 10 mg. given IV. Cetacaine Spray given aerosolized.  Monitoring: BP and pulse monitoring done. Oximetry used. Supplemental O2 given  Findings Normal: Proximal Esophagus to Duodenal 2nd Portion. Comments: Small hiatal hernia present.  - Dilation: Distal Esophagus. for dysphagia without stricture. Maloney dilator used, Diameter: 18 mm, No Resistance, No Heme present on extraction. 1  total dilators used. Patient tolerance excellent.   Assessment Normal examination.  Events  Unplanned Intervention: No unplanned interventions were required.  Unplanned Events: There were no complications. Plans Instructions: Nothing to eat or drink for 2 hrs.  Clear or full liquids: 2 hrs.  Medication(s): Continue current medications.  Disposition: After procedure patient  sent to recovery. After recovery patient sent home.  Comments: Return to the care of Dr. Reola Calkins for further management of Chest pain.  This report was created from the original endoscopy report, which was reviewed and signed by the above listed endoscopist.   cc:  Gaynelle Cage, MD

## 2010-05-27 ENCOUNTER — Encounter: Payer: Self-pay | Admitting: Internal Medicine

## 2010-06-05 DIAGNOSIS — E119 Type 2 diabetes mellitus without complications: Secondary | ICD-10-CM

## 2010-06-05 HISTORY — DX: Type 2 diabetes mellitus without complications: E11.9

## 2010-08-19 ENCOUNTER — Encounter: Payer: Self-pay | Admitting: Family Medicine

## 2010-08-19 NOTE — Op Note (Signed)
NAMEJOLYN, [name]                 ACCOUNT NO.:  [id number]   MEDICAL RECORD NO.:  [id number]          PATIENT TYPE:  INP   LOCATION:  0006                         FACILITY:  [facility]   PHYSICIAN:  Ollen Gross, M.D.    DATE OF BIRTH:  1945/10/25   DATE OF PROCEDURE:  01/09/2008  DATE OF DISCHARGE:                               OPERATIVE REPORT   PREOPERATIVE DIAGNOSIS:  Osteoarthritis, left knee.   POSTOPERATIVE DIAGNOSIS:  Osteoarthritis, left knee.   PROCEDURE:  Left total knee arthroplasty.   SURGEON:  Dr. Lequita Halt.   ASSISTANT:  Avel Peace, PA-C.   ANESTHESIA:  Spinal with Duramorph.   ESTIMATED BLOOD LOSS:  Minimal.   DRAINS:  None.   TOURNIQUET TIME:  29 minutes at 300 mmHg.   COMPLICATIONS:  None.   CONDITION:  Stable to recovery.   CLINICAL NOTE:  [name] is a 65 year old female with end-stage  arthritis of the left knee with progressively worsening pain and  dysfunction.  She has had a previous successful right total knee  arthroplasty and presents now for left total knee arthroplasty.   PROCEDURE IN DETAIL:  After the successful administration of spinal  anesthetic, a tourniquet was placed high on the left thigh and the left  lower extremity prepped and draped in the usual sterile fashion.  The  extremity was wrapped in an Esmarch, knee flexed, and tourniquet  inflated to 300 mmHg.  A midline incision was made with a 10 blade  through subcutaneous tissue to the level of the extensor mechanism.  A  fresh blade was used to make a medial parapatellar arthrotomy.  Soft  tissue over the proximal medial tibia was subperiosteally elevated to  the joint line with a knife and into the semimembranosus bursa with a  Cobb elevator.  Soft tissue laterally is elevated with attention being  paid to avoid the patellar tendon on the tibial tubercle.  The patella  subluxed laterally, knee flexed 90 degrees, ACL and PCL removed.  A  drill was used to create a starting hole  in the distal femur and the  canal was thoroughly irrigated.  The 5-degree left valgus alignment  guide is placed and referencing of the posterior condyle rotation is  marked and a block pinned to remove 11 mm of distal femur.  It took 11  because of preop flexion contracture.  Distal femoral resection is made  with an oscillating saw.  Sizing block is placed and size 3 is most  appropriate.  Rotation is marked at the epicondylar axis.  Size 3  cutting block is placed and the anterior, posterior, and chamfer cuts  made.   Tibia subluxed forward and menisci removed.  Extramedullary tibial  alignment guide is placed, referencing proximally at the medial aspect  of the tibial tubercle and distally along the second metatarsal axis and  tibial crest.  Block is pinned to remove 10 mm of the non-deficient  lateral side.  Tibial resection is made with an oscillating saw.  Size 3  is the most appropriate tibial component and the proximal tibia is  prepared with the modular drill and keel punch for a size 3.  Femoral  preparation is completed with the intercondylar cut.   Size 3 mobile bearing tibial trial, size 3 posterior stabilized femoral  trial, and a 10-mm posterior stabilized rotating platform insert trial  are placed.  With the 10, full extension is achieved with excellent  varus, valgus, anterior, and posterior balance throughout with full  range of motion.  The patella is then everted and thickness measured to  be 22 mm.  Free-hand resection is taken at 13 mm, 38 template is placed,  lug holes are drilled, trial patella is placed, and it tracks normally.  Osteophytes are removed off the posterior femur with the trial in place.   All trials are removed and the cut bone surfaces are prepared with  pulsatile lavage.  Cement is mixed and once ready for implantation, a  size 3 mobile bearing tibial tray, size 3 posterior stabilized femur,  and 38 patella are cemented into place.  The  patella is held with the  clamp.  Trial 10-mm inserts are placed, knee held in full extension, and  all extruded cement removed.  When the cement had fully hardened, then  the permanent 10-mm posterior stabilized rotating platform insert is  placed into the tibial tray.   The wound is copiously irrigated with saline solution and FloSeal  injected into the posterior capsule, medial and lateral gutters, and  suprapatellar area.  A moist sponge is placed and tourniquet released  with a total time of 29 minutes.  Minimal bleeding is encountered.  The  bleeding that is encountered is stopped with electrocautery. We again  irrigated and the arthrotomy was closed with interrupted #1 PDS.  Flexion against gravity to 135 degrees.  Subcu was closed with  interrupted 2-0 Vicryl and subcuticular running 4-0 Monocryl.  The  incision was cleaned and dried and Steri-Strips and a bulky sterile  dressing applied.  She was then placed into a knee immobilizer,  awakened, and transported to recovery in stable condition.      Ollen Gross, M.D.  Electronically Signed     FA/MEDQ  D:  01/09/2008  T:  01/10/2008  Job:  [id number]

## 2010-08-19 NOTE — Discharge Summary (Signed)
[name], [name]                 ACCOUNT NO.:  [id number]   MEDICAL RECORD NO.:  [id number]          PATIENT TYPE:  INP   LOCATION:  1609                         FACILITY:  [facility]   PHYSICIAN:  Ollen Gross, M.D.    DATE OF BIRTH:  08-04-1945   DATE OF ADMISSION:  01/09/2008  DATE OF DISCHARGE:  01/12/2008                               DISCHARGE SUMMARY   ADMITTING DIAGNOSES:  1. Osteoarthritis left knee.  2. Anxiety.  3. Depression.  4. History of bronchitis.  5. Hypertension.  6. Hiatal hernia.  7. Reflux disease.  8. Hypercholesterolemia.  9. History of hemorrhoids.  10.Irritable bowel syndrome.  11.Cervical degenerative disk disease.  12.Fibromyalgia.  13.Postmenopausal.  14.Past history of endometriosis.  15.Childhood illnesses of measles and mumps.   DISCHARGE DIAGNOSES:  1. Osteoarthritis left knee status post left total knee replacement      arthroplasty.  2. Mild postop hyponatremia improved.  3. Anxiety.  4. Depression.  5. History of bronchitis.  6. Hypertension.  7. Hiatal hernia.  8. Reflux disease.  9. Hypercholesterolemia.  10.History of hemorrhoids.  11.Irritable bowel syndrome.  12.Cervical degenerative disk disease.  13.Fibromyalgia.  14.Postmenopausal.  15.Past history of endometriosis.  16.Childhood illnesses of measles and mumps.   PROCEDURE:  January 09, 2008, left total knee.   SURGEON:  Dr. Lequita Halt.  Assistant:  Avel Peace PA-C.  Anesthesia:  Spinal with Duramorph.   CONSULTS:  None.   BRIEF HISTORY:  [name] is a 65 year old female with end-stage  arthritis of the left knee with progressive worsening pain dysfunction.  She has failed nonoperative management, now presents for a total knee  arthroplasty.   LABORATORY DATA:  Preop CBC showed hemoglobin of 14.9, hematocrit 43.9,  white cell count 9.1, platelets 245.  Chem panel on admission had blood  glucose of 174.  Remaining Chem panel within normal limits.  PT/INR 11.6  and  0.8 with a PTT of 24.  Preop UA negative.  Serial CBCs were  followed.  Postop hemoglobin was down to 12.4 then 11.4.  Last H and H  was 11.0 and 31.8.  Serial pro-times followed.  Last PT/INR 17.4, 1.4.  Serial BMETs were followed.  Sodium did drop down to 133, came back to  136.  Remaining electrolytes remained within normal limits.   Chest x-ray:  January 04, 2008, no active cardiopulmonary disease.   EKG January 04, 2008, normal sinus rhythm, nonspecific T-wave  abnormality.  Since last tracing rate was faster confirmed by Dr.  Sharyn Lull.   HOSPITAL COURSE:  The patient admitted to [facility],  tolerated procedure well, later to the recovery room orthopedic floor,  started on PCA and p.o. analgesic pain control following surgery, given  24 hours postop IV antibiotics.  Was not able to get much rest through  the night due to pain.  Pain was a little bit better by the morning.  Did not the Percocet, so we switched over Vicodin.  Blood pressure  medications were on hold with parameters but they were resumed  postoperatively given her Nexium for her reflux.  Started getting up out  of bed on day 1.  By day 2 she was doing better and walking about 70-75  feet.  Dressing was change, incision looked good.  Hemoglobin was stable  11.4.  Her blood pressure had started creeping up.  The lisinopril was  on hold initially postoperatively but we resumed it since her blood  pressure started coming back up to 140s.  It was back down and well-  controlled; back down 130s.  She continued to progress well and by the  morning of January 12, 2008, she was doing well, tolerating her meds and  was discharged home.   DISCHARGE PLAN:  1. Discharged home on January 12, 2008.  2. Discharge diagnoses, please see above.  3. Discharge meds, Norco 10, Robaxin, Coumadin and Lovenox for 1 more      ejection at home.   DIET:  Low-sodium, low-cholesterol diet.   ACTIVITY:  Weightbearing as  tolerated, total knee protocol.  Home health  PT, home health nursing.   FOLLOWUP:  Two weeks.   DISPOSITION:  Home.   CONDITION UPON DISCHARGE:  Improved.      [name], P.A.C.      Ollen Gross, M.D.  Electronically Signed    ALP/MEDQ  D:  01/12/2008  T:  01/12/2008  Job:  [id number]   cc:   Ollen Gross, M.D.  Fax: [phone]   Ernestina Penna, M.D.  Fax: [phone]

## 2010-08-19 NOTE — H&P (Signed)
NAME:  [name], [name]                 ACCOUNT NO.:  [id number]   MEDICAL RECORD NO.:  [id number]          PATIENT TYPE:  INP   LOCATION:  NA                           FACILITY:  [facility]   PHYSICIAN:  Ollen Gross, M.D.    DATE OF BIRTH:  15-Sep-1945   DATE OF ADMISSION:  DATE OF DISCHARGE:                              HISTORY & PHYSICAL   DATE OF OFFICE VISIT HISTORY AND PHYSICAL:  December 27, 2007.   CHIEF COMPLAINT:  Left knee pain.   HISTORY OF PRESENT ILLNESS:  The patient is a 65 year old female who has  seen by Dr. Lequita Halt for ongoing left knee problems.  She has known  significant arthritis.  She has been treated conservatively in the past  but despite conservative measures still has pain.  She is felt to be a  good candidate.  Risks and benefits have been discussed.  She elects to  proceed with surgery.   ALLERGIES:  PENICILLIN causes blisters but no anaphylactic reaction.   INTOLERANCES:  1. ERYTHROMYCIN causes nausea.  2. SULFA causes nausea.  3. CODEINE makes her sick.   CURRENT MEDICATIONS:  1. Zetia.  2. Lisinopril/HCTZ.  3. Alprazolam.  4. Hydrocodone.  5. Pristiq.  6. Fish oil.  7. Multivitamin.  8. Centrum Silver.  9. Prilosec.   PAST MEDICAL HISTORY:  1. Anxiety.  2. Depression.  3. History of bronchitis.  4. Hypertension.  5. Hiatal hernia.  6. Reflux disease.  7. Hypercholesterolemia.  8. History of hemorrhoids.  9. Irritable bowel syndrome.  10.Cervical degenerative disk disease.  11.Fibromyalgia.  12.Postmenopausal.  13.Childhood illnesses of measles and mumps.  14.Past history of endometriosis.   PAST SURGICAL HISTORY:  1. Hysterectomy.  2. Achilles tendon surgery.  3. Total knee replacement on the right tendon.  4. Left elbow surgery.   SOCIAL HISTORY:  Married.  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  Half pack per day  smoker for approximately 10 years.  No alcohol.  Two children.  She does  have a caregiver lined up after surgery.  Has two steps  entering her  home.   FAMILY HISTORY:  Father deceased at age 86 with a history of cirrhosis.  Mother deceased at age 76 with a history of heart failure.  She has a  sibling with a history of liver failure.   REVIEW OF SYSTEMS:  GENERAL:  No fevers, chills, night sweats.  NEUROLOGIC:  No seizures, syncope or paralysis.  RESPIRATORY:  No  shortness of breath, productive cough or hemoptysis.  CARDIOVASCULAR:  No chest pain.  GI:  Occasional diarrhea and constipation with her IBS.  No nausea or vomiting.  GU:  No dysuria, hematuria or discharge.  MUSCULOSKELETAL:  Left knee.   PHYSICAL EXAMINATION:  VITAL SIGNS:  Pulse 76, respirations 14, blood  pressure 128/76.  GENERAL:  A 61-year white female, well-nourished, well-developed,  slightly overweight.  In no acute distress, alert, oriented and  cooperative.  HEENT:  Normocephalic, atraumatic.  Pupils are round and reactive.  Oropharynx clear.  EOMs intact.  NECK:  Supple.  CHEST:  Clear.  HEART:  Regular rate and rhythm without murmur.  S1-S2 noted.  ABDOMEN:  Soft, round.  Bowel sounds present.  RECTAL/BREAST/GENITALIA:  Not done; not pertinent to present illness.  EXTREMITIES:  Left knee range of motion 0 to 125.  Tender more lateral  than medial.  No instability or antalgic gait.   IMPRESSION:  Osteoarthritis of left knee.   PLAN:  The patient was admitted to [facility] to undergo left  total knee replacement arthroplasty.  Surgery will be performed by Dr.  Ollen Gross.  She has used [facility] with her previous  surgery.      Alexzandrew L. Perkins, P.A.C.      Ollen Gross, M.D.  Electronically Signed    ALP/MEDQ  D:  01/08/2008  T:  01/09/2008  Job:  [id number]   cc:   Ernestina Penna, M.D.  Fax: [phone]

## 2010-08-22 NOTE — Op Note (Signed)
NAME:  [name], [name]                           ACCOUNT NO.:  [id number]   MEDICAL RECORD NO.:  [id number]                   PATIENT TYPE:  INP   LOCATION:  X010                                 FACILITY:  [facility]   PHYSICIAN:  Ollen Gross, M.D.                 DATE OF BIRTH:  11-Feb-1946   DATE OF PROCEDURE:  01/15/2003  DATE OF DISCHARGE:                                 OPERATIVE REPORT   PREOPERATIVE DIAGNOSIS:  Osteoarthritis, right knee.   POSTOPERATIVE DIAGNOSIS:  Osteoarthritis, right knee.   PROCEDURE:  Right total knee arthroplasty.   SURGEON:  Gus Rankin. Aluisio, M.D.   ASSISTANT:  Alexzandrew L. Julien Girt, P.A.   ANESTHESIA:  Spinal.   ESTIMATED BLOOD LOSS:  Minimal.   DRAIN:  Hemovac x 1.   TOURNIQUET TIME:  52 minutes at 300 mmHg.   COMPLICATIONS:  None.   CONDITION:  Stable to recovery.   BRIEF CLINICAL NOTE:  [name] is a 65 year old female with significant end-  stage arthritis of right knee with pain refractory to nonoperative  management.  She presents now for a right total knee arthroplasty.   PROCEDURE IN DETAIL:  After the successful administration of spinal  anesthetic, a tourniquet is placed high on the right thigh and the right  lower extremity prepped and draped in the usual sterile fashion.  Extremity  is wrapped in Esmarch, knee flexed, the tourniquet inflated to 300 mmHg.  Standard midline incision is made with a 10 blade through the subcutaneous  tissue to the level of the extensor mechanism, and a then fresh blade is  used to make a medial parapatellar arthrotomy, and the soft tissue over the  proximal and medial tibia is subperiosteally elevated to the joint line with  the knife and into the semimembranosus bursa with a curved osteotome.  Soft  tissue over the proximal and lateral tibia is also elevated with attention  being paid to avoiding the patellar tendon on tibial tubercle.  The patella  is everted, knee flexed 90 degrees, ACL and PCL  removed.  Drill is used to  create a starting hole in the distal femur; the canal is irrigated and a  five degree right valgus alignment guide placed.  Referencing off the  posterior condyles, rotation is marked and a block pinned to remove 10 mm  off the distal femur.  Distal femoral resection is made with an oscillating  saw.   Sizing block is placed; size 4 is most appropriate.  Rotation is marked at  the epicondylar axis, anterior-posterior block placed and the AP cuts made.   Tibia is subluxed forward, and the menisci are removed.  Extramedullary  tibial alignment guide is placed referencing proximally at the medial aspect  of the tibial tubercle and distally along the second metatarsal axis and  tibial crest.  A block is pinned to remove 10 mm off the nondeficient  lateral side.  Tibial resection is made with an oscillating saw.  The size 4  was also the most appropriate tibial component.  The proximal tibia is then  prepared with the modular drill and keel punch for a size 4 mobile bearing  component.  Femoral preparation is completed with the intercondylar and  chamfer cuts.  Trials are placed.  Size 4 posterior stabilized femur, size 4  mobile bearing tibial trial.  A 10 mm posterior stabilized rotating platform  insert trial is placed, and full extension is achieved with excellent varus  and valgus balance throughout.  Patella thickness is measured to be 20 mm,  free hand resection taken to 11 mm, 38 template placed, lug holes drilled,  trial patella placed, and it tracks with some lateral tilt.  I did a partial  lateral release, and then she tracked normally.  We then removed osteophytes  off the posterior femur with the trial in place, removed the trials, and  prepared all of the cut bone surfaces with pulsatile lavage.  Cement is  mixed and once ready for implantation, the size 4 mobile bearing tibial  tray, size 4 posterior stabilized femur, the 38 patella are cemented  into  place, and patella is held with a clamp.  The 10 mm posterior stabilized  insert trial is placed, knee held in full extension, and all extruded cement  removed.  Once the cement is fully hardened, then the permanent 10 mm  posterior stabilized rotating platform insert is placed into the tibial  tray.  The wound is copiously irrigated with antibiotic solution, extensor  mechanism closed over a Hemovac drain with interrupted #1 PDS.  Subcu is  closed with interrupted 2-0 Vicryl and subcuticular with running 4-0  Monocryl.  Incision is cleaned and dried and Steri-Strips and a bulky  sterile dressing applied.  She is then awakened and transported to recovery  in stable condition.                                               Ollen Gross, M.D.    FA/MEDQ  D:  01/15/2003  T:  01/15/2003  Job:  [id number]

## 2010-08-22 NOTE — H&P (Signed)
NAME:  [name], [name]                           ACCOUNT NO.:  [id number]   MEDICAL RECORD NO.:  [id number]                   PATIENT TYPE:  INP   LOCATION:  0455                                 FACILITY:  [facility]   PHYSICIAN:  Ollen Gross, M.D.                 DATE OF BIRTH:  Aug 31, 1945   DATE OF ADMISSION:  01/15/2003  DATE OF DISCHARGE:                                HISTORY & PHYSICAL   CHIEF COMPLAINT:  Right knee pain.   HISTORY OF PRESENT ILLNESS:  The patient is a 65 year old female who has  been seen by Ollen Gross, M.D. for ongoing right knee pain.  She has a  several year history of worsening right knee pain.  She has reached a point  now where the pain has been progressive and hurts virtually all the time.  She is even having pain at night.  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  She has undergone previous injections in the form of Hyalgan  with two series of treatments and also cortisone injections.  The last  series did not help her at all.  She has been referred over for  consideration of knee replacement.  She is seen in the office where x-rays  show bone on bone changes medial and large marginal osteophytes and  patellofemoral narrowing.  She does have significant arthritis and felt the  most predictable means of improving her function is to undergo knee  replacement.  Risks and benefits have been discussed with the patient at  length and she elected to proceed with surgery.   ALLERGIES:  PENICILLIN causes blisters, but no anaphylactic reaction.  Intolerances:  ERYTHROMYCIN causes nausea, SULFA causes nausea.   CURRENT MEDICATIONS:  1. Nexium 40 mg daily.  2. Ambien 10 mg daily.  3. Zyrtec 10 mg daily.  4. Lisinopril/hydrochlorothiazide 20/25 mg daily.  5. Celexa 40 mg daily.  6. Endocet 5 mg p.r.n. pain, fibromyalgia.  7. Carbidopa/levodopa 10/100 as needed for restless  legs.  8. Flexeril p.r.n.   PAST MEDICAL HISTORY:  1. History of depression which she takes Celexa.  2. History of bronchitis.  3. Hypertension.  4. Hiatal hernia.  5. Reflux disease.  6. Irritable bowel syndrome.  7. Hemorrhoids.  8. Cervical degenerative disk disease.  9. Surgical menopause.  10.      Newly diagnosed borderline diabetes currently under work-up.  11.      Fibromyalgia.  12.      Endometriosis.    PAST SURGICAL HISTORY:  1. Bilateral oophorectomy.  2. Total hysterectomy.  3. Elbow surgery.   SOCIAL HISTORY:  Married.  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  One pack  per day smoker.  No alcohol.  Has two children.  Her husband, Jesusita Oka, will be  assisting with her  care after surgery.   FAMILY HISTORY:  Father deceased age 2 with history of cirrhosis.  Her  mother age 61 with history of heart disease, hypertension, diabetes, and  arthritis.   REVIEW OF SYSTEMS:  GENERAL:  No fevers, chills, night sweats.  NEUROLOGIC:  History of depression.  No seizures, syncope, paralysis.  RESPIRATORY:  Some  occasional productive cough.  She attributes this to some sinus drainage and  shortness of breath on exertion.  She does not have any shortness of breath  at rest.  CARDIOVASCULAR:  No chest pain, angina.  She does give a little  bit of history of difficulty history of breathing while lying flat but she  attributes this to more of a reflux.  GASTROINTESTINAL:  She does have  reflux and some occasional constipation.  She does have history of IBS.  No  nausea, vomiting.  No blood or mucus in the stool.  GENITOURINARY:  No  dysuria, hematuria, discharge.  MUSCULOSKELETAL:  Pertinent to that of the  knee found in the history of present illness.   PHYSICAL EXAMINATION:  VITAL SIGNS:  Pulse 84, respirations 20, blood  pressure 142/84.  GENERAL:  The patient is a 65 year old white female well-nourished, well-  developed, in no acute distress.  She is alert, oriented,  cooperative.  HEENT:  Normocephalic, atraumatic.  Pupils round, reactive.  EOMs are  intact.  NECK:  Supple.  CHEST:  Clear anterior/posterior chest walls.  No rhonchi, rales, or  wheezes.  HEART:  Regular rate and rhythm.  No murmur.  ABDOMEN:  Soft, nontender.  Bowel sounds are present.  RECTAL:  Not done.  Not pertinent to present illness.  BREASTS:  Not done.  Not pertinent to present illness.  GENITALIA:  Not done.  Not pertinent to present illness.  EXTREMITIES:  Right knee:  She does have a slight varus deformity noted.  Range of motion of 5 to 125 degrees.  There is crepitus noted on passive  range of motion with no instability.  She is tender medially.  Pulses noted  to be intact.   IMPRESSION:  1. Osteoarthritis right knee.  2. Depression.  3. History of bronchitis.  4. Hypertension.  5. Hiatal hernia.  6. Reflux disease.  7. Hemorrhoids.  8. Irritable bowel syndrome.  9. Cervical degenerative disk disease.  10.      Remote history of borderline diabetes currently under work-up.  11.      Surgical menopause.  12.      Endometriosis.   PLAN:  The patient will be admitted to [facility] to undergo a  right total knee arthroplasty.  Surgery will be performed by Ollen Gross,  M.D.  The patient has been seen preoperatively by her medical physician,  Magnus Sinning. Rice, M.D. and felt there is no contraindication for undergoing  knee surgery.     Alexzandrew L. Julien Girt, P.A.              Ollen Gross, M.D.    ALP/MEDQ  D:  01/16/2003  T:  01/16/2003  Job:  [id number]   cc:   Ernestina Penna, M.D.  [address] [place]  [place] [address]  Fax: [phone]   Magnus Sinning. Dimple Casey, M.D.  [address] [place]  [place] [address]  Fax: [phone]

## 2010-08-22 NOTE — Discharge Summary (Signed)
NAME:  [name], BRANSCOM                           ACCOUNT NO.:  [id number]   MEDICAL RECORD NO.:  [id number]                   PATIENT TYPE:  INP   LOCATION:  0455                                 FACILITY:  [facility]   PHYSICIAN:  Ollen Gross, M.D.                 DATE OF BIRTH:  [birth year]   DATE OF ADMISSION:  01/15/2003  DATE OF DISCHARGE:  01/19/2003                                 DISCHARGE SUMMARY   ADMITTING DIAGNOSES:  1. Osteoarthritis right knee.  2. Depression.  3. History of bronchitis.  4. Hypertension.  5. Hiatal hernia.  6. Reflux disease.  7. Hemorrhoids.  8. Irritable bowel syndrome.  9. Cervical degenerative disk disease.  10.      History of borderline diabetes currently under work-up.  11.      Surgical menopause.  12.      Endometriosis.   DISCHARGE DIAGNOSES:  1. Osteoarthritis right knee status post right total knee arthroplasty.  2. Mild postoperative blood loss anemia.  3. Hyponatremia.  4. Depression.  5. History of bronchitis.  6. Hypertension.  7. Hiatal hernia.  8. Reflux disease.  9. Hemorrhoids.  10.      Irritable bowel syndrome.  11.      Cervical degenerative disk disease.  12.      History of borderline diabetes currently under work-up.  13.      Surgical menopause.  14.      Endometriosis.   PROCEDURE:  The patient was taken to the OR on January 15, 2003.  Underwent  a right total knee arthroplasty.  Surgeon Ollen Gross, M.D.  Assistant  Alexzandrew L. Julien Girt, P.A.  Anesthesia spinal.  Minimal blood loss.  Hemovac drain x1.  Tourniquet time 52 minutes at 300 mmHg.   CONSULTS:  None.   BRIEF HISTORY:  The patient is a 65 year old female with a significant end-  stage arthritis of the right knee.  This has been refractory to nonoperative  management in the past.  It is felt she has reached a point where she would  benefit from undergoing knee replacement.  Risks and benefits discussed.  The patient is subsequently admitted to the  hospital.   LABORATORY DATA:  CBC on admission showed a hemoglobin of 15.1, hematocrit  of 43.1, white cell count normal at 7.8, red cell count 4.78.  Differential  all within normal limits.  Postoperative H&H 12.7 and 37.3.  Last noted H&H  11.1 and 32.3.  PT/PTT on admission were 11.9 and 29, respectively with INR  0.8.  Serial pro times followed.  Last noted PT/INR 15.4 and 1.4.  Chemistry  panel on admission showed mild low potassium at 3.4, elevated glucose of  172, minimally elevated ALT of 42.  Remaining liver functions, ALP, AST all  within normal limits.  Serial BMETs were followed.  Potassium came back up  to 4.1.  Sodium did  drop a little bit from 138 down to 131.  Glucose  improved down to 145.  Urinalysis on admission negative.  Blood group type  A+.  Chest x-ray dated January 09, 2003:  No evidence of active disease in  the chest.  There is an EKG report on the chart dated January 19, 2002.  Ventricular rate 64, within normal limits.  It was sent over from [facility].   HOSPITAL COURSE:  The patient was admitted to [facility], taken to  OR.  Underwent above stated procedure without complication.  The patient  tolerated procedure well.  Later transferred to recovery room and then to  the orthopedic floor for continued postoperative care.  Vital signs were  followed.  The patient was given 24 hours of postoperative IV Ancef,  Coumadin for DVT prophylaxis.  Placed on PCA and p.o. analgesia for pain  control following surgery.  PT and OT were consulted to assist with gait  training, ambulation, ADLs.  Placed weightbearing as tolerated.  Hemovac  drain placed at time of surgery was pulled on postoperative day one without  difficulty.  She was found to have a pillow under the knee on postoperative  day one.  Reiterated to the staff that she was not to have a pillow under  the knee, that she may have it under the foot for elevation, but not under   the knee to prevent flexion contracture.  Laboratories were followed very  closely.  She was encouraged to take p.o. medications along with PCA.  By  day two she had had a rough night, but was feeling better the next day.  She  was weaned over to p.o. medications and the PCA and IVs were discontinued  along with Foley.  From a therapy standpoint patient was starting to show  progress with her PT.  She had been up ambulating 40 feet on day one and  then increased up to 200 feet by day two with 280 feet by day three.  She  did very well with her physical therapy.  Dressing change initiated on  postoperative day two.  Incision was healing well.  She continued to  progress well.  It was felt she would be going home later that week.  By  January 19, 2003 she had been up ambulating well with physical therapy,  tolerating her p.o. medications, was seen in rounds by Ollen Gross, M.D.  who decided patient would be discharged home at that time.   DISCHARGE PLAN:  The patient discharged home on January 19, 2003.   DISCHARGE DIAGNOSES:  Please see above.   DISCHARGE MEDICATIONS:  1. Percocet.  2. Robaxin.  3. Restoril.  4. Coumadin.   DIET:  As tolerated.   ACTIVITY:  Gait training, ambulation.  Home health PT and home health  nursing.  Weightbearing as tolerated.   FOLLOWUP:  Two weeks from surgery.    DISPOSITION:  Home.   CONDITION ON DISCHARGE:  Improved.     Alexzandrew L. Julien Girt, P.A.              Ollen Gross, M.D.    ALP/MEDQ  D:  02/14/2003  T:  02/14/2003  Job:  [id number]   cc:   Ernestina Penna, M.D.  [address] [place]  [place] [address]  Fax: [phone]   Magnus Sinning. Dimple Casey, M.D.  [address] [place]  [place] [address]  Fax: [phone]

## 2010-10-29 ENCOUNTER — Encounter: Payer: Self-pay | Admitting: Cardiology

## 2010-10-29 ENCOUNTER — Ambulatory Visit (INDEPENDENT_AMBULATORY_CARE_PROVIDER_SITE_OTHER): Payer: Medicare Other | Admitting: Cardiology

## 2010-10-29 DIAGNOSIS — E6609 Other obesity due to excess calories: Secondary | ICD-10-CM | POA: Insufficient documentation

## 2010-10-29 DIAGNOSIS — R9431 Abnormal electrocardiogram [ECG] [EKG]: Secondary | ICD-10-CM

## 2010-10-29 DIAGNOSIS — E669 Obesity, unspecified: Secondary | ICD-10-CM

## 2010-10-29 NOTE — Patient Instructions (Signed)
The current medical regimen is effective;  continue present plan and medications.  Follow up as needed 

## 2010-10-29 NOTE — Progress Notes (Signed)
HPI The patient presents for evaluation of an abnormal EKG and cardiovascular risk factors. I saw her many years ago and she had a negative stress perfusion study. She is referred back because of continued risk factors. She's now been found to have diabetes. The patient denies any new symptoms such as chest discomfort, neck or arm discomfort. There has been no new shortness of breath, PND or orthopnea. There have been no reported palpitations, presyncope or syncope.  She is not very active however because of back and knee problems.   Allergies  Allergen Reactions  . Codeine   . Crestor (Rosuvastatin Calcium)   . Erythromycin   . Lyrica (Pregabalin)     Edema in feet   . Penicillins   . Statins   . Sulfa Antibiotics   . Tricor     Acne & edema    . Zocor (Simvastatin)     Current Outpatient Prescriptions  Medication Sig Dispense Refill  . ALPRAZolam (XANAX) 0.5 MG tablet Take 0.5 mg by mouth at bedtime as needed.        . ARIPiprazole (ABILIFY) 2 MG tablet Take 2 mg by mouth daily.        Marland Kitchen aspirin 81 MG EC tablet Take 81 mg by mouth daily.        Marland Kitchen escitalopram (LEXAPRO) 20 MG tablet Take 20 mg by mouth daily.        Marland Kitchen esomeprazole (NEXIUM) 40 MG capsule Take 40 mg by mouth daily before breakfast.        . ezetimibe (ZETIA) 10 MG tablet Take 10 mg by mouth daily.        Marland Kitchen gabapentin (NEURONTIN) 600 MG tablet Take 600 mg by mouth 3 (three) times daily.        Marland Kitchen HYDROcodone-acetaminophen (NORCO) 7.5-325 MG per tablet Take 1 tablet by mouth every 6 (six) hours as needed.        Marland Kitchen lisinopril-hydrochlorothiazide (PRINZIDE,ZESTORETIC) 20-25 MG per tablet Take 1 tablet by mouth daily.        . Omega-3 Fatty Acids (FISH OIL) 1000 MG CAPS Take by mouth 2 (two) times daily.        . sitaGLIPtan-metformin (JANUMET) 50-500 MG per tablet Take 1 tablet by mouth daily.          Past Medical History  Diagnosis Date  . Diabetes mellitus, type 2 06/2010  . Depression   . Hyperlipidemia   .  Hypertension   . Hiatal hernia   . Fibromyalgia     [place]   . Esophageal stricture   . Colon polyps   . Arthritis     [place]   . Menopause   . H/O: hysterectomy 1981    Past Surgical History  Procedure Date  . 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 Vesicovaginal fistula closure w/ tah 1981  . Refractive surgery 2003    Family History  Problem Relation Age of Onset  . Diabetes Mother   . Heart failure Mother     History   Social History  . Marital Status: Married    Spouse Name: N/A    Number of Children: N/A  . Years of Education: N/A   Occupational History  . Not on file.   Social History Main Topics  . Smoking status: Current Everyday Smoker  . Smokeless tobacco: Not on file  . Alcohol Use: Not on file  . Drug Use: Not on file  . Sexually Active: Not on  file   Other Topics Concern  . Not on file   Social History Narrative  . No narrative on file    ROS:  As stated in the HPI and negative for all other systems.   PHYSICAL EXAM BP 120/72  Pulse 83  Resp 18  Ht 5\' 6"  (1.676 m)  Wt 221 lb (100.245 kg)  BMI 35.67 kg/m2 GENERAL:  Well appearing HEENT:  Pupils equal round and reactive, fundi not visualized, oral mucosa unremarkable NECK:  No jugular venous distention, waveform within normal limits, carotid upstroke brisk and symmetric, no bruits, no thyromegaly LYMPHATICS:  No cervical, inguinal adenopathy LUNGS:  Clear to auscultation bilaterally BACK:  No CVA tenderness CHEST:  Unremarkable HEART:  PMI not displaced or sustained,S1 and S2 within normal limits, no S3, no S4, no clicks, no rubs, no murmurs ABD:  Flat, positive bowel sounds normal in frequency in pitch, no bruits, no rebound, no guarding, no midline pulsatile mass, no hepatomegaly, no splenomegaly, obese EXT:  2 plus pulses throughout, no edema, no cyanosis no clubbing SKIN:  No rashes no nodules NEURO:  Cranial nerves II through XII grossly intact, motor grossly intact  throughout PSYCH:  Cognitively intact, oriented to person place and time   EKG:   Sinus rhythm, rate 77, axis within normal limits, intervals within normal limits, inferolateral T-wave inversions unchanged from previous.  ASSESSMENT AND PLAN

## 2010-10-29 NOTE — Assessment & Plan Note (Signed)
The patient does have an abnormal EKG. With her risk factors I would suggest an exercise treadmill test but she said she would not be able to do this. She would refuse any kind of pharmacologic stress test. Therefore, we should pursue only aggressive risk reduction. Note her EKG is unchanged from previous.

## 2010-10-29 NOTE — Assessment & Plan Note (Signed)
The patient understands the need to lose weight with diet and exercise. We have discussed specific strategies for this.  I have suggested the [facility] or [facility].

## 2011-01-05 LAB — COMPREHENSIVE METABOLIC PANEL
ALT: 26
AST: 21
Albumin: 3.7
Alkaline Phosphatase: 76
Calcium: 8.9
Creatinine, Ser: 0.69
GFR calc Af Amer: >60
GFR calc non Af Amer: >60
Potassium: 3.6
Sodium: 138
Total Bilirubin: 0.7

## 2011-01-05 LAB — CBC
HCT: 43.9
HCT: 31.8 — ABNORMAL LOW
HCT: 34 — ABNORMAL LOW
Hemoglobin: 14.9
Hemoglobin: 11 — ABNORMAL LOW
Hemoglobin: 11.4 — ABNORMAL LOW
Hemoglobin: 12.4
MCHC: 33.8
MCHC: 34.4
MCV: 93.2
MCV: 93.7
MCV: 94
RBC: 3.4 — ABNORMAL LOW
RBC: 3.93
RDW: 13.1
WBC: 10.8 — ABNORMAL HIGH
WBC: 11 — ABNORMAL HIGH

## 2011-01-05 LAB — BASIC METABOLIC PANEL
BUN: 9
CO2: 30
CO2: 33 — ABNORMAL HIGH
Chloride: 96
Chloride: 97
Chloride: 98
GFR calc Af Amer: >60
GFR calc Af Amer: >60
GFR calc non Af Amer: >60
GFR calc non Af Amer: >60
Glucose, Bld: 149 — ABNORMAL HIGH
Glucose, Bld: 151 — ABNORMAL HIGH
Potassium: 4.6
Sodium: 133 — ABNORMAL LOW
Sodium: 135
Sodium: 136

## 2011-01-05 LAB — TYPE AND SCREEN: ABO/RH(D): A POS

## 2011-01-05 LAB — PROTIME-INR
INR: 0.8
INR: 1
INR: 1.4
Prothrombin Time: 11.6
Prothrombin Time: 13.2

## 2011-01-05 LAB — URINALYSIS, ROUTINE W REFLEX MICROSCOPIC
Glucose, UA: NEGATIVE
pH: 6.5

## 2011-01-05 LAB — ABO/RH: ABO/RH(D): A POS

## 2011-05-27 ENCOUNTER — Other Ambulatory Visit: Payer: Self-pay | Admitting: Family Medicine

## 2011-05-27 DIAGNOSIS — R053 Chronic cough: Secondary | ICD-10-CM

## 2011-05-27 DIAGNOSIS — R05 Cough: Secondary | ICD-10-CM

## 2011-05-29 ENCOUNTER — Ambulatory Visit
Admission: RE | Admit: 2011-05-29 | Discharge: 2011-05-29 | Disposition: A | Discharge status: Home / Self Care | Payer: Medicare Other | Source: Ambulatory Visit | Attending: Family Medicine | Admitting: Family Medicine

## 2011-05-29 DIAGNOSIS — R053 Chronic cough: Secondary | ICD-10-CM

## 2011-05-29 DIAGNOSIS — R05 Cough: Secondary | ICD-10-CM

## 2011-06-02 ENCOUNTER — Other Ambulatory Visit: Payer: Self-pay | Admitting: Family Medicine

## 2011-06-02 DIAGNOSIS — E041 Nontoxic single thyroid nodule: Secondary | ICD-10-CM

## 2011-06-04 ENCOUNTER — Ambulatory Visit
Admission: RE | Admit: 2011-06-04 | Discharge: 2011-06-04 | Disposition: A | Discharge status: Home / Self Care | Payer: Medicare Other | Source: Ambulatory Visit | Attending: Family Medicine | Admitting: Family Medicine

## 2011-06-04 ENCOUNTER — Other Ambulatory Visit: Payer: Self-pay | Admitting: Family Medicine

## 2011-06-04 DIAGNOSIS — E041 Nontoxic single thyroid nodule: Secondary | ICD-10-CM

## 2011-06-11 ENCOUNTER — Other Ambulatory Visit: Payer: Self-pay | Admitting: Emergency Medicine

## 2011-06-16 ENCOUNTER — Other Ambulatory Visit (HOSPITAL_COMMUNITY)
Admission: RE | Admit: 2011-06-16 | Discharge: 2011-06-16 | Disposition: A | Discharge status: Home / Self Care | Payer: Medicare Other | Source: Ambulatory Visit | Attending: Interventional Radiology | Admitting: Interventional Radiology

## 2011-06-16 ENCOUNTER — Ambulatory Visit
Admission: RE | Admit: 2011-06-16 | Discharge: 2011-06-16 | Disposition: A | Discharge status: Home / Self Care | Payer: Medicare Other | Source: Ambulatory Visit | Attending: Family Medicine | Admitting: Family Medicine

## 2011-06-16 DIAGNOSIS — E049 Nontoxic goiter, unspecified: Secondary | ICD-10-CM | POA: Insufficient documentation

## 2011-06-16 DIAGNOSIS — E041 Nontoxic single thyroid nodule: Secondary | ICD-10-CM

## 2011-10-12 ENCOUNTER — Encounter: Payer: Self-pay | Admitting: Cardiology

## 2012-06-23 ENCOUNTER — Encounter: Payer: Self-pay | Admitting: Family Medicine

## 2012-06-23 ENCOUNTER — Ambulatory Visit (INDEPENDENT_AMBULATORY_CARE_PROVIDER_SITE_OTHER): Payer: Medicare Other | Admitting: Family Medicine

## 2012-06-23 VITALS — BP 118/65 | HR 85 | Temp 98.6°F | Ht 66.0 in | Wt 217.0 lb

## 2012-06-23 DIAGNOSIS — J209 Acute bronchitis, unspecified: Secondary | ICD-10-CM

## 2012-06-23 DIAGNOSIS — E119 Type 2 diabetes mellitus without complications: Secondary | ICD-10-CM | POA: Insufficient documentation

## 2012-06-23 DIAGNOSIS — Z86010 Personal history of colonic polyps: Secondary | ICD-10-CM

## 2012-06-23 DIAGNOSIS — M797 Fibromyalgia: Secondary | ICD-10-CM | POA: Insufficient documentation

## 2012-06-23 DIAGNOSIS — G8929 Other chronic pain: Secondary | ICD-10-CM

## 2012-06-23 DIAGNOSIS — R1031 Right lower quadrant pain: Secondary | ICD-10-CM

## 2012-06-23 DIAGNOSIS — M8580 Other specified disorders of bone density and structure, unspecified site: Secondary | ICD-10-CM | POA: Insufficient documentation

## 2012-06-23 DIAGNOSIS — M5137 Other intervertebral disc degeneration, lumbosacral region: Secondary | ICD-10-CM | POA: Insufficient documentation

## 2012-06-23 DIAGNOSIS — I10 Essential (primary) hypertension: Secondary | ICD-10-CM | POA: Insufficient documentation

## 2012-06-23 DIAGNOSIS — IMO0001 Reserved for inherently not codable concepts without codable children: Secondary | ICD-10-CM

## 2012-06-23 LAB — BASIC METABOLIC PANEL WITH GFR
BUN: 13 mg/dL (ref 6–23)
CO2: 26 mEq/L (ref 19–32)
Chloride: 97 mEq/L (ref 96–112)
GFR, Est African American: 83 mL/min
Glucose, Bld: 128 mg/dL — ABNORMAL HIGH (ref 70–99)
Potassium: 3.9 mEq/L (ref 3.5–5.3)

## 2012-06-23 LAB — POCT CBC
Hemoglobin: 13.7 g/dL (ref 12.2–16.2)
MCHC: 33.2 g/dL (ref 31.8–35.4)
MPV: 7.8 fL (ref 0–99.8)
POC Granulocyte: 8.3 — AB (ref 2–6.9)
POC LYMPH PERCENT: 25.4 %L (ref 10–50)
RBC: 4.7 M/uL (ref 4.04–5.48)

## 2012-06-23 LAB — HEPATIC FUNCTION PANEL
Alkaline Phosphatase: 79 U/L (ref 39–117)
Indirect Bilirubin: 0.2 mg/dL (ref 0.0–0.9)
Total Protein: 6.9 g/dL (ref 6.0–8.3)

## 2012-06-23 LAB — POCT UA - MICROSCOPIC ONLY
Mucus, UA: NEGATIVE
Yeast, UA: NEGATIVE

## 2012-06-23 MED ORDER — AZITHROMYCIN 250 MG PO TABS: ORAL_TABLET | ORAL | Status: DC

## 2012-06-23 MED ORDER — ALBUTEROL SULFATE HFA 108 (90 BASE) MCG/ACT IN AERS: 2.0000 | INHALATION_SPRAY | Freq: Four times a day (QID) | RESPIRATORY_TRACT | Status: DC | PRN

## 2012-06-23 NOTE — Progress Notes (Signed)
  Subjective:    [name], female    DOB: 1945-05-07, 67 y.o.   MRN: [id number]  HPI This patient presents for evaluation of right lower quadrant pain and head congestion  Pain for 1 and 1/2 months. No N/V/D.  She had a neg xray(kub) a few weeks ago. The pain has has persisted. Dr. Ethelene Hal gave her an injection which seemed to decrease  The pain.  It is located over her R Superior anterior iliac crest.  She denies urinary symptoms. H/o smoking-quit 1 1/2 yrs ago. H/o total hysterectomy. This side pain seems to be positional related. When she is laying in the bed. When she gets up and starts moving.  No one  accompanies the patient.  Medications and allergies reviewed.    Review of Systems  Constitutional: Negative for fever.  HENT: Positive for congestion, postnasal drip and sinus pressure.   Respiratory: Positive for cough (productive,green).   Gastrointestinal: Positive for abdominal pain (R lower quad pain) and constipation. Negative for nausea, vomiting, diarrhea, blood in stool and rectal pain.       Objective:   Physical Exam BP 118/65  Pulse 85  Temp(Src) 98.6 F (37 C) (Oral)  Ht 5\' 6"  (1.676 m)  Wt 217 lb (98.431 kg)  BMI 35.04 kg/m2  The patient appeared well nourished and normally developed, alert and oriented to time and place. Speech, behavior and judgement appear normal. Vital signs as documented.  Head exam is unremarkable. No scleral icterus or pallor noted.  Neck is without jugular venous distension, thyromegally, or carotid bruits. Carotid upstrokes are brisk bilaterally. No cervical adenopathy. Lungs congestion on auscultation. Normal respiratory effort. Cardiac exam reveals regular rate and rhythm. First and second heart sounds normal. No murmurs, rubs or gallops.  Abdominal exam reveals normal bowl sounds, no masses, no organomegaly and no aortic enlargement. No inguinal adenopathy. Extremities are nonedematous.l. Skin without pallor or  jaundice.  Warm and dry, without rash.           Assessment & Plan:  1. Abdominal pain, chronic, right lower quadrant, left  - POCT CBC - POCT UA - Microscopic Only - Hepatic function panel - Fecal occult blood, imunochemical - BASIC METABOLIC PANEL WITH GFR - US Abdomen Complete; Future  2. Acute bronchitis zpack Albuterol inhaler   Neg Chest CT 05/2011 colonsocopy 3 years ago and she is due again now bc she had polyps.

## 2012-06-23 NOTE — Patient Instructions (Signed)
Drink lots of fluids.  mucinex max 1 tab 2x daily

## 2012-06-27 ENCOUNTER — Other Ambulatory Visit (INDEPENDENT_AMBULATORY_CARE_PROVIDER_SITE_OTHER): Payer: Medicare Other

## 2012-06-27 DIAGNOSIS — Z1212 Encounter for screening for malignant neoplasm of rectum: Secondary | ICD-10-CM

## 2012-06-27 DIAGNOSIS — R195 Other fecal abnormalities: Secondary | ICD-10-CM

## 2012-06-27 NOTE — Progress Notes (Signed)
Patient dropped out FOBT for send out lab

## 2012-06-27 NOTE — Progress Notes (Signed)
Patient dropped off fobt 

## 2012-06-30 ENCOUNTER — Other Ambulatory Visit: Payer: Medicare Other

## 2012-06-30 ENCOUNTER — Ambulatory Visit
Admission: RE | Admit: 2012-06-30 | Discharge: 2012-06-30 | Disposition: A | Discharge status: Home / Self Care | Payer: Medicare Other | Source: Ambulatory Visit | Attending: Family Medicine | Admitting: Family Medicine

## 2012-08-03 ENCOUNTER — Ambulatory Visit: Payer: Medicare Other | Admitting: Physical Therapy

## 2012-08-18 ENCOUNTER — Ambulatory Visit (INDEPENDENT_AMBULATORY_CARE_PROVIDER_SITE_OTHER): Payer: Medicare Other | Admitting: General Practice

## 2012-08-18 ENCOUNTER — Encounter: Payer: Self-pay | Admitting: General Practice

## 2012-08-18 VITALS — BP 132/69 | HR 85 | Temp 97.7°F | Ht 66.0 in | Wt 223.0 lb

## 2012-08-18 DIAGNOSIS — N390 Urinary tract infection, site not specified: Secondary | ICD-10-CM

## 2012-08-18 DIAGNOSIS — R350 Frequency of micturition: Secondary | ICD-10-CM

## 2012-08-18 LAB — POCT UA - MICROSCOPIC ONLY: Mucus, UA: NEGATIVE

## 2012-08-18 LAB — POCT URINALYSIS DIPSTICK
Bilirubin, UA: NEGATIVE
Glucose, UA: NEGATIVE
Ketones, UA: NEGATIVE
Spec Grav, UA: 1.01

## 2012-08-18 MED ORDER — CIPROFLOXACIN HCL 500 MG PO TABS: 500.0000 mg | ORAL_TABLET | Freq: Two times a day (BID) | ORAL | Status: DC

## 2012-08-18 NOTE — Addendum Note (Signed)
Addended by: Monica Becton on: 08/18/2012 09:58 AM   Modules accepted: Orders

## 2012-08-18 NOTE — Patient Instructions (Signed)
Urinary Tract Infection Urinary tract infections (UTIs) can develop anywhere along your urinary tract. Your urinary tract is your body's drainage system for removing wastes and extra water. Your urinary tract includes two kidneys, two ureters, a bladder, and a urethra. Your kidneys are a pair of bean-shaped organs. Each kidney is about the size of your fist. They are located below your ribs, one on each side of your spine. CAUSES Infections are caused by microbes, which are microscopic organisms, including fungi, viruses, and bacteria. These organisms are so small that they can only be seen through a microscope. Bacteria are the microbes that most commonly cause UTIs. SYMPTOMS  Symptoms of UTIs may vary by age and gender of the patient and by the location of the infection. Symptoms in young women typically include a frequent and intense urge to urinate and a painful, burning feeling in the bladder or urethra during urination. Older women and men are more likely to be tired, shaky, and weak and have muscle aches and abdominal pain. A fever may mean the infection is in your kidneys. Other symptoms of a kidney infection include pain in your back or sides below the ribs, nausea, and vomiting. DIAGNOSIS To diagnose a UTI, your caregiver will ask you about your symptoms. Your caregiver also will ask to provide a urine sample. The urine sample will be tested for bacteria and white blood cells. White blood cells are made by your body to help fight infection. TREATMENT  Typically, UTIs can be treated with medication. Because most UTIs are caused by a bacterial infection, they usually can be treated with the use of antibiotics. The choice of antibiotic and length of treatment depend on your symptoms and the type of bacteria causing your infection. HOME CARE INSTRUCTIONS  If you were prescribed antibiotics, take them exactly as your caregiver instructs you. Finish the medication even if you feel better after you  have only taken some of the medication.  Drink enough water and fluids to keep your urine clear or pale yellow.  Avoid caffeine, tea, and carbonated beverages. They tend to irritate your bladder.  Empty your bladder often. Avoid holding urine for long periods of time.  Empty your bladder before and after sexual intercourse.  After a bowel movement, women should cleanse from front to back. Use each tissue only once. SEEK MEDICAL CARE IF:   You have back pain.  You develop a fever.  Your symptoms do not begin to resolve within 3 days. SEEK IMMEDIATE MEDICAL CARE IF:   You have severe back pain or lower abdominal pain.  You develop chills.  You have nausea or vomiting.  You have continued burning or discomfort with urination. MAKE SURE YOU:   Understand these instructions.  Will watch your condition.  Will get help right away if you are not doing well or get worse. Document Released: 12/31/2004 Document Revised: 09/22/2011 Document Reviewed: 05/01/2011 Exit[employer] Patient Information 2013 [employer].  

## 2012-08-18 NOTE — Progress Notes (Signed)
  Subjective:    [name], female    DOB: 1945/06/25, 67 y.o.   MRN: [id number]  HPI Presents today with frequent urination along with burning. Onset was yesterday. Reports only a small amount of urine comes out when voiding. Also has urgency. Denies taking OTC medications.     Review of Systems  Constitutional: Positive for chills. Negative for fever.  Respiratory: Negative for cough, chest tightness and shortness of breath.   Cardiovascular: Negative for chest pain and palpitations.  Genitourinary: Positive for dysuria, urgency and frequency. Negative for flank pain.  Neurological: Negative for dizziness and headaches.  Psychiatric/Behavioral: Negative.        Objective:   Physical Exam  Constitutional: She is oriented to person, place, and time. She appears well-developed and well-nourished.  Cardiovascular: Normal rate, regular rhythm and normal heart sounds.   Pulmonary/Chest: Effort normal and breath sounds normal. No respiratory distress. She exhibits no tenderness.  Abdominal: Soft. Bowel sounds are normal. She exhibits no distension and no mass. There is no tenderness. There is no rebound and no guarding.  Neurological: She is alert and oriented to person, place, and time.  Skin: Skin is warm and dry.  Psychiatric: She has a normal mood and affect.   Results for orders placed in visit on 08/18/12  POCT URINALYSIS DIPSTICK      Result Value Range   Color, UA gold     Clarity, UA cloudy     Glucose, UA neg     Bilirubin, UA neg     Ketones, UA neg     Spec Grav, UA 1.010     Blood, UA lg     pH, UA 7.5     Protein, UA 4+     Urobilinogen, UA negative     Nitrite, UA pos     Leukocytes, UA large (3+)          Assessment & Plan:  1. Frequency of urination - POCT urinalysis dipstick - POCT UA - Microscopic Only  2. UTI (urinary tract infection) - ciprofloxacin (CIPRO) 500 MG tablet; Take 1 tablet (500 mg total) by mouth 2 (two) times daily.   Dispense: 20 tablet; Refill: 0  Increase fluid intake AZO over the counter X2 days Frequent voiding Proper perineal hygiene RTO prn Culture pending Patient verbalized understanding Coralie Keens, FNP-C

## 2012-08-20 LAB — URINE CULTURE

## 2012-09-06 ENCOUNTER — Encounter: Payer: Self-pay | Admitting: Internal Medicine

## 2012-09-15 ENCOUNTER — Encounter: Payer: Self-pay | Admitting: *Deleted

## 2012-10-12 ENCOUNTER — Ambulatory Visit: Payer: Self-pay | Admitting: Family Medicine

## 2012-10-21 ENCOUNTER — Other Ambulatory Visit (INDEPENDENT_AMBULATORY_CARE_PROVIDER_SITE_OTHER): Payer: Medicare Other

## 2012-10-21 ENCOUNTER — Other Ambulatory Visit: Payer: Self-pay | Admitting: Family Medicine

## 2012-10-21 DIAGNOSIS — E785 Hyperlipidemia, unspecified: Secondary | ICD-10-CM

## 2012-10-21 DIAGNOSIS — I10 Essential (primary) hypertension: Secondary | ICD-10-CM

## 2012-10-21 DIAGNOSIS — R5381 Other malaise: Secondary | ICD-10-CM

## 2012-10-21 DIAGNOSIS — R5383 Other fatigue: Secondary | ICD-10-CM

## 2012-10-21 DIAGNOSIS — R7989 Other specified abnormal findings of blood chemistry: Secondary | ICD-10-CM

## 2012-10-21 DIAGNOSIS — E559 Vitamin D deficiency, unspecified: Secondary | ICD-10-CM

## 2012-10-21 LAB — POCT CBC
Lymph, poc: 2.6 (ref 0.6–3.4)
MCH, POC: 29.3 pg (ref 27–31.2)
MCHC: 33.9 g/dL (ref 31.8–35.4)
MCV: 86.4 fL (ref 80–97)
MPV: 8.3 fL (ref 0–99.8)
Platelet Count, POC: 225 10*3/uL (ref 142–424)
WBC: 6.7 10*3/uL (ref 4.6–10.2)

## 2012-10-21 LAB — BASIC METABOLIC PANEL WITH GFR
BUN: 10 mg/dL (ref 6–23)
CO2: 30 mEq/L (ref 19–32)
Calcium: 9.1 mg/dL (ref 8.4–10.5)
Creat: 0.68 mg/dL (ref 0.50–1.10)

## 2012-10-21 LAB — HEPATIC FUNCTION PANEL
AST: 26 U/L (ref 0–37)
Albumin: 3.8 g/dL (ref 3.5–5.2)
Alkaline Phosphatase: 69 U/L (ref 39–117)
Total Bilirubin: 0.4 mg/dL (ref 0.3–1.2)
Total Protein: 6.4 g/dL (ref 6.0–8.3)

## 2012-10-21 NOTE — Progress Notes (Signed)
Patient came in for labs only.

## 2012-10-25 ENCOUNTER — Ambulatory Visit (INDEPENDENT_AMBULATORY_CARE_PROVIDER_SITE_OTHER): Payer: Medicare Other | Admitting: Family Medicine

## 2012-10-25 ENCOUNTER — Encounter: Payer: Self-pay | Admitting: Family Medicine

## 2012-10-25 VITALS — BP 135/82 | HR 93 | Temp 99.6°F | Ht 66.0 in | Wt 225.0 lb

## 2012-10-25 DIAGNOSIS — M5137 Other intervertebral disc degeneration, lumbosacral region: Secondary | ICD-10-CM

## 2012-10-25 DIAGNOSIS — E119 Type 2 diabetes mellitus without complications: Secondary | ICD-10-CM

## 2012-10-25 DIAGNOSIS — I10 Essential (primary) hypertension: Secondary | ICD-10-CM

## 2012-10-25 DIAGNOSIS — M797 Fibromyalgia: Secondary | ICD-10-CM

## 2012-10-25 DIAGNOSIS — E669 Obesity, unspecified: Secondary | ICD-10-CM

## 2012-10-25 DIAGNOSIS — IMO0001 Reserved for inherently not codable concepts without codable children: Secondary | ICD-10-CM

## 2012-10-25 LAB — NMR LIPOPROFILE WITH LIPIDS
HDL Size: 8.6 nm — ABNORMAL LOW (ref >=9.2)
LDL Particle Number: 2398 nmol/L — ABNORMAL HIGH (ref <1000)
LDL Size: 19.6 nm — ABNORMAL LOW (ref >20.5)
Large HDL-P: 2.1 umol/L — ABNORMAL LOW (ref >=4.8)
Large VLDL-P: 12.3 nmol/L — ABNORMAL HIGH (ref <=2.7)
Small LDL Particle Number: 1906 nmol/L — ABNORMAL HIGH (ref <=527)
Triglycerides: 367 mg/dL — ABNORMAL HIGH (ref <150)
VLDL Size: 50.6 nm — ABNORMAL HIGH (ref <=46.6)

## 2012-10-25 LAB — CK: Total CK: 41 U/L (ref 7–177)

## 2012-10-25 NOTE — Progress Notes (Signed)
  Subjective:    [name], female    DOB: 11/25/1945, 67 y.o.   MRN: [id number]  HPI Patient returns to clinic today for followup and management of chronic medical problems which include diabetes mellitus, hyperlipidemia, hypertension, and depression. She also has a history of fibromyalgia and degenerative disc disease. Also see the review of systems. Her health maintenance issues appear to be up-to-date. She is also followed by the gastroenterologist and the orthopedist. She sees the cardiologist on an as needed basis. The patient's recent labs were reviewed with her today. Her home blood sugars run between 125 and 135 fasting.   Review of Systems  Constitutional: Positive for fatigue.  HENT: Negative.   Eyes: Negative.   Respiratory: Negative.   Cardiovascular: Positive for leg swelling (feet swelling - right is worse).  Gastrointestinal: Negative.   Endocrine: Negative.   Genitourinary: Negative.   Musculoskeletal: Positive for myalgias (feet and leg pain with neuropathy).  Allergic/Immunologic: Negative.   Neurological: Negative.   Hematological: Negative.   Psychiatric/Behavioral: Negative.        Objective:   Physical Exam BP 135/82  Pulse 93  Temp(Src) 99.6 F (37.6 C) (Oral)  Ht 5\' 6"  (1.676 m)  Wt 225 lb (102.059 kg)  BMI 36.33 kg/m2  The patient appeared well nourished, mildly obese, and normally developed, alert and oriented to time and place. Speech, behavior and judgement appear normal. Vital signs as documented.  Head exam is unremarkable. No scleral icterus or pallor noted. There was some nasal congestion on the right. Mouth throat and ears were normal.  Neck is without jugular venous distension, thyromegally, or carotid bruits. Carotid upstrokes are brisk bilaterally. No cervical adenopathy. Lungs are clear anteriorly and posteriorly to auscultation. Normal respiratory effort. Cardiac exam reveals regular rate and rhythm at 60 per minute. First  and second heart sounds normal.  No murmurs, rubs or gallops.  Abdominal exam reveals normal bowl sounds, no masses, no organomegaly and no aortic enlargement. No inguinal adenopathy. Generalized abdominal tenderness was apparent. Extremities are nonedematous and both pedal pulses are normal. Skin without pallor or jaundice.  Warm and dry, without rash. Neurologic exam reveals normal deep tendon reflexes and normal sensation. Diabetic foot exam was done          Assessment & Plan:  DDD (degenerative disc disease), lumbosacral  Essential hypertension, benign  Fibromyalgia syndrome  Obesity  Type II or unspecified type diabetes mellitus without mention of complication, not stated as uncontrolled  Patient Instructions  Fall precautions discussed at visit Increase gabapentin to 3-1/2 tablets daily for week and then 4 tablets daily thereafter, one with each meal and eventually one at bedtime Start Livalo 2 mg one on Monday Wednesday and Friday at bedtime. Try sample medication We will draw a CK today and a CK and liver function tests should be repeated in 6 weeks Continue as aggressive therapeutic lifestyle changes as possible, meaning diet and exercise    Nyra Capes MD

## 2012-10-25 NOTE — Addendum Note (Signed)
Addended by: Magdalene River on: 10/25/2012 09:38 AM   Modules accepted: Orders

## 2012-10-25 NOTE — Addendum Note (Signed)
Addended by: Lisbeth Ply C on: 10/25/2012 10:13 AM   Modules accepted: Orders

## 2012-10-25 NOTE — Patient Instructions (Addendum)
Fall precautions discussed at visit Increase gabapentin to 3-1/2 tablets daily for week and then 4 tablets daily thereafter, one with each meal and eventually one at bedtime Start Livalo 2 mg one on Monday Wednesday and Friday at bedtime. Try sample medication We will draw a CK today and a CK and liver function tests should be repeated in 6 weeks Continue as aggressive therapeutic lifestyle changes as possible, meaning diet and exercise

## 2012-10-26 ENCOUNTER — Telehealth: Payer: Self-pay | Admitting: Pharmacist

## 2012-10-26 NOTE — Telephone Encounter (Signed)
Message copied by Henrene Pastor on Wed Oct 26, 2012  1:54 PM ------      Message from: Ernestina Penna      Created: Tue Oct 25, 2012  7:43 AM       Electrolytes within normal limit. Blood sugar is elevated at 162. Kidney function tests are good.      Liver function tests within normal limit      On advanced lipid testing a total LDL particle number is very elevated 2398. This number should be less than 1000. The triglycerides are elevated at 367. Small LDL [place] number is elevated at 1906. LDL size is small.----Patient is statin intolerant, but must do better with diet and exercise to get the blood sugar and cholesterol under control----------- please set her up a visit with the clinical pharmacist to review all her options.      Vitamin D is 41, increase D. 3 x 1000 daily ------

## 2012-10-26 NOTE — Telephone Encounter (Signed)
LMOM

## 2012-10-26 NOTE — Telephone Encounter (Signed)
Called patient to make appt - she refused appt with clinical pharmacist.

## 2012-10-27 ENCOUNTER — Telehealth: Payer: Self-pay | Admitting: *Deleted

## 2012-10-27 NOTE — Telephone Encounter (Signed)
Pt notified of results

## 2012-10-27 NOTE — Telephone Encounter (Signed)
Message copied by Bearl Mulberry on Thu Oct 27, 2012  5:59 PM ------      Message from: Ernestina Penna      Created: Tue Oct 25, 2012  1:01 PM       Hemoglobin A1c 7.0%, this is not bad a little bit lower it would be better ------

## 2012-10-31 NOTE — Telephone Encounter (Signed)
Pt notified that [facility] wanted her to see [facility]  Pt will schedule appt

## 2012-11-04 ENCOUNTER — Ambulatory Visit (AMBULATORY_SURGERY_CENTER): Payer: Medicare Other | Admitting: *Deleted

## 2012-11-04 ENCOUNTER — Encounter: Payer: Self-pay | Admitting: Internal Medicine

## 2012-11-04 VITALS — Ht 66.0 in | Wt 228.2 lb

## 2012-11-04 DIAGNOSIS — Z86010 Personal history of colonic polyps: Secondary | ICD-10-CM

## 2012-11-04 MED ORDER — MOVIPREP 100 G PO SOLR: ORAL | Status: DC

## 2012-11-04 NOTE — Progress Notes (Signed)
No allergies to eggs or soy. No problems with anesthesia.  

## 2012-11-22 ENCOUNTER — Encounter: Payer: Medicare Other | Admitting: Internal Medicine

## 2012-11-22 ENCOUNTER — Encounter: Payer: Self-pay | Admitting: Internal Medicine

## 2012-11-22 ENCOUNTER — Ambulatory Visit (AMBULATORY_SURGERY_CENTER): Payer: Medicare Other | Admitting: Internal Medicine

## 2012-11-22 VITALS — BP 129/56 | HR 61 | Temp 98.4°F | Resp 23 | Ht 66.0 in | Wt 228.0 lb

## 2012-11-22 DIAGNOSIS — D126 Benign neoplasm of colon, unspecified: Secondary | ICD-10-CM

## 2012-11-22 DIAGNOSIS — Z86010 Personal history of colonic polyps: Secondary | ICD-10-CM

## 2012-11-22 LAB — GLUCOSE, CAPILLARY: Glucose-Capillary: 122 mg/dL — ABNORMAL HIGH (ref 70–99)

## 2012-11-22 MED ORDER — SODIUM CHLORIDE 0.9 % IV SOLN: 500.0000 mL | INTRAVENOUS | Status: DC

## 2012-11-22 NOTE — Progress Notes (Signed)
Patient did not have preoperative order for IV antibiotic SSI prophylaxis. (G8918)  Patient did not experience any of the following events: a burn prior to discharge; a fall within the facility; wrong site/side/patient/procedure/implant event; or a hospital transfer or hospital admission upon discharge from the facility. (G8907)  

## 2012-11-22 NOTE — Patient Instructions (Addendum)
YOU HAD AN ENDOSCOPIC PROCEDURE TODAY AT THE [facility]: Refer to the procedure report that was given to you for any specific questions about what was found during the examination.  If the procedure report does not answer your questions, please call your gastroenterologist to clarify.  If you requested that your care partner not be given the details of your procedure findings, then the procedure report has been included in a sealed envelope for you to review at your convenience later.  YOU SHOULD EXPECT: Some feelings of bloating in the abdomen. Passage of more gas than usual.  Walking can help get rid of the air that was put into your GI tract during the procedure and reduce the bloating. If you had a lower endoscopy (such as a colonoscopy or flexible sigmoidoscopy) you may notice spotting of blood in your stool or on the toilet paper. If you underwent a bowel prep for your procedure, then you may not have a normal bowel movement for a few days.  DIET: Your first meal following the procedure should be a light meal and then it is ok to progress to your normal diet.  A half-sandwich or bowl of soup is an example of a good first meal.  Heavy or fried foods are harder to digest and may make you feel nauseous or bloated.  Likewise meals heavy in dairy and vegetables can cause extra gas to form and this can also increase the bloating.  Drink plenty of fluids but you should avoid alcoholic beverages for 24 hours.  ACTIVITY: Your care partner should take you home directly after the procedure.  You should plan to take it easy, moving slowly for the rest of the day.  You can resume normal activity the day after the procedure however you should NOT DRIVE or use heavy machinery for 24 hours (because of the sedation medicines used during the test).    SYMPTOMS TO REPORT IMMEDIATELY: A gastroenterologist can be reached at any hour.  During normal business hours, 8:30 AM to 5:00 PM Monday through Friday,  call [phone].  After hours and on weekends, please call the GI answering service at [phone] who will take a message and have the physician on call contact you.   Following lower endoscopy (colonoscopy or flexible sigmoidoscopy):  Excessive amounts of blood in the stool  Significant tenderness or worsening of abdominal pains  Swelling of the abdomen that is new, acute  Fever of 100F or higher  FOLLOW UP: If any biopsies were taken you will be contacted by phone or by letter within the next 1-3 weeks.  Call your gastroenterologist if you have not heard about the biopsies in 3 weeks.  Our staff will call the home number listed on your records the next business day following your procedure to check on you and address any questions or concerns that you may have at that time regarding the information given to you following your procedure. This is a courtesy call and so if there is no answer at the home number and we have not heard from you through the emergency physician on call, we will assume that you have returned to your regular daily activities without incident.  SIGNATURES/CONFIDENTIALITY: You and/or your care partner have signed paperwork which will be entered into your electronic medical record.  These signatures attest to the fact that that the information above on your After Visit Summary has been reviewed and is understood.  Full responsibility of the confidentiality of this   discharge information lies with you and/or your care-partner.  Recommendations Follow up colonoscopy in 5 years 

## 2012-11-22 NOTE — Progress Notes (Signed)
Called to room to assist during endoscopic procedure.  Patient ID and intended procedure confirmed with present staff. Received instructions for my participation in the procedure from the performing physician.  

## 2012-11-22 NOTE — Op Note (Signed)
[facility] [address] [place], [address]   COLONOSCOPY PROCEDURE REPORT  PATIENT: [name], [name]  MR#: [id number] BIRTHDATE: 16-Aug-1945 , 67  yrs. old GENDER: Female ENDOSCOPIST: Roxy Cedar, MD REFERRED [id number] Program Recall PROCEDURE DATE:  11/22/2012 PROCEDURE:   Colonoscopy with snare polypectomy x 1 First Screening Colonoscopy - Avg.  risk and is 50 yrs.  old or older - No.  Prior Negative Screening - Now for repeat screening. N/A  History of Adenoma - Now for follow-up colonoscopy & has been > or = to 3 yrs.  Yes hx of adenoma.  Has been 3 or more years since last colonoscopy.  Polyps Removed Today? Yes. ASA CLASS:   Class II INDICATIONS:Patient's personal history of adenomatous colon polyps. Index 2005 (NAP); followup 2011 (multiple adenomas) MEDICATIONS: MAC sedation, administered by CRNA and propofol (Diprivan) 350mg  IV  DESCRIPTION OF PROCEDURE:   After the risks benefits and alternatives of the procedure were thoroughly explained, informed consent was obtained.  A digital rectal exam revealed no abnormalities of the rectum.   The LB [id number]  endoscope was introduced through the anus and advanced to the cecum, which was identified by both the appendix and ileocecal valve. No adverse events experienced.   The quality of the prep was excellent, using MoviPrep  The instrument was then slowly withdrawn as the colon was fully examined.   COLON FINDINGS: A diminutive polyp was found in the rectum.  A polypectomy was performed with a cold snare.  The resection was complete and the polyp tissue was completely retrieved.   Mild diverticulosis was noted in the sigmoid colon.   The colon mucosa was otherwise normal.  Retroflexed views revealed internal hemorrhoids. The time to cecum=2 minutes 42 seconds.  Withdrawal time=15 minutes 20 seconds.  The scope was withdrawn and the procedure completed. COMPLICATIONS: There were no  complications.  ENDOSCOPIC IMPRESSION: 1.   Diminutive polyp was found in the rectum; polypectomy was performed with a cold snare 2.   Mild diverticulosis was noted in the sigmoid colon 3.   The colon mucosa was otherwise normal  RECOMMENDATIONS: 1. Follow up colonoscopy in 5 years   eSigned:  Roxy Cedar, MD 11/22/2012 9:21 AM   cc: Rudi Heap, MD and The Patient   PATIENT NAME:  [name], [name] MR#: [id number]

## 2012-11-23 ENCOUNTER — Telehealth: Payer: Self-pay | Admitting: *Deleted

## 2012-11-23 NOTE — Telephone Encounter (Signed)
  Follow up Call-  Call back number 11/22/2012  Post procedure Call Back phone  # [phone]  Permission to leave phone message Yes     Patient questions:  Do you have a fever, pain , or abdominal swelling? no Pain Score  0 *  Have you tolerated food without any problems? yes  Have you been able to return to your normal activities? yes  Do you have any questions about your discharge instructions: Diet   no Medications  no Follow up visit  no  Do you have questions or concerns about your Care? no  Actions: * If pain score is 4 or above: No action needed, pain <4.

## 2012-11-24 ENCOUNTER — Ambulatory Visit: Payer: Medicare Other

## 2012-11-28 ENCOUNTER — Encounter: Payer: Self-pay | Admitting: Internal Medicine

## 2012-12-13 ENCOUNTER — Ambulatory Visit (INDEPENDENT_AMBULATORY_CARE_PROVIDER_SITE_OTHER): Payer: Medicare Other | Admitting: Pharmacist Clinician (PhC)/ Clinical Pharmacy Specialist

## 2012-12-13 DIAGNOSIS — E1149 Type 2 diabetes mellitus with other diabetic neurological complication: Secondary | ICD-10-CM

## 2012-12-13 DIAGNOSIS — E785 Hyperlipidemia, unspecified: Secondary | ICD-10-CM

## 2012-12-13 NOTE — Progress Notes (Signed)
  Subjective:    [name], female    DOB: 05/18/1945, 67 y.o.   MRN: [id number]  HPI:  Patient with type 2 diabetes and hyperlipidemia.  History of statin intolerance.  Currently is taking livalo three times a week and tolerating it well without myalgias.    Review of Systems  Constitutional: Negative.  Negative for activity change.  HENT: Negative.   Eyes: Negative.   Respiratory: Negative.   Endocrine: Negative.   Musculoskeletal: Positive for myalgias and back pain.  Skin: Negative.   Allergic/Immunologic: Negative.   Neurological: Negative.   Psychiatric/Behavioral: Negative.        Objective:   Physical Exam  Constitutional: She is oriented to person, place, and time. She appears well-developed and well-nourished.  Cardiovascular: Normal rate and regular rhythm.   Neurological: She is alert and oriented to person, place, and time.  Skin: Skin is warm and dry.  Psychiatric: She has a normal mood and affect. Her behavior is normal. Judgment and thought content normal.          Assessment & Plan:   Diabetes Follow-Up Visit Chief Complaint:  No chief complaint on file.    Exam Regularity:  RRR Edema:  neg  Polyuria:  neg  Polydipsia:  neg Polyphagia:  Neg   BMI:  There is no weight on file to calculate BMI.   Weight changes:  Stable 220lb General Appearance:  alert, oriented, no acute distress Mood/Affect:  normal  HPI:  Type 2 diabetes and elevated lipids for years  Low fat/carbohydrate diet?  No Nicotine Abuse?  No Medication Compliance?  Yes Exercise?  No Alcohol Abuse?  No  Home BG Monitoring:  Checking 1 times a day. Average:  140mg /dL   High:    Low:      Lab Results  Component Value Date   HGBA1C 7.0% 10/25/2012    No results found for this basename: Concepcion Elk    Lab Results  Component Value Date   LDLCALC 110* 10/21/2012   TRIG 367* 10/21/2012      Assessment: 1.  Diabetes.  Well controlled with an A1C of 7% 2.   Blood Pressure.  At goal 3.  Lipids.  Pending results on livalo, zetia, and fish oil 4.  Foot Care.  Daily care reviewed 5.  Dental Care.  Twice a year 6.  Eye Care/Exam.  annual  Recommendations: 1.  Patient is counseled on appropriate foot care. 2.  BP goal < 130/80. 3.  LDL goal of < 100, HDL > 40 and TG < 150. 4.  Eye Exam yearly and Dental Exam every 6 months. 5.  Dietary recommendations:  :  Keep food diary.  Reviewed CHO meal planning 6.  Physical Activity recommendations:  Referred to PT and water aerobics 7.  Medication recommendations at this time are as follows:  Add amitriptyline 10mg  qhs to aid in treatment of neuropathy. 8.  Return to clinic in 4-6 wks   Time spent counseling patient:  35 min  Physician time spent with patient:    Referring provider:  Christell Constant   PharmD:  [facility] Pharmacist

## 2012-12-14 ENCOUNTER — Other Ambulatory Visit (INDEPENDENT_AMBULATORY_CARE_PROVIDER_SITE_OTHER): Payer: Medicare Other

## 2012-12-14 DIAGNOSIS — E119 Type 2 diabetes mellitus without complications: Secondary | ICD-10-CM

## 2012-12-14 DIAGNOSIS — M797 Fibromyalgia: Secondary | ICD-10-CM

## 2012-12-14 DIAGNOSIS — I10 Essential (primary) hypertension: Secondary | ICD-10-CM

## 2012-12-14 LAB — HEPATIC FUNCTION PANEL
Alkaline Phosphatase: 65 U/L (ref 39–117)
Bilirubin, Direct: 0.1 mg/dL (ref 0.0–0.3)
Indirect Bilirubin: 0.3 mg/dL (ref 0.0–0.9)
Total Bilirubin: 0.4 mg/dL (ref 0.3–1.2)

## 2012-12-14 NOTE — Progress Notes (Signed)
Patient came in for labs only.

## 2012-12-21 ENCOUNTER — Telehealth: Payer: Self-pay | Admitting: Family Medicine

## 2012-12-21 NOTE — Telephone Encounter (Signed)
Patient aware in lab results °

## 2013-01-30 ENCOUNTER — Other Ambulatory Visit (INDEPENDENT_AMBULATORY_CARE_PROVIDER_SITE_OTHER): Payer: Medicare Other

## 2013-01-30 ENCOUNTER — Other Ambulatory Visit: Payer: Self-pay

## 2013-01-30 ENCOUNTER — Ambulatory Visit: Payer: Medicare Other | Admitting: *Deleted

## 2013-01-30 DIAGNOSIS — M797 Fibromyalgia: Secondary | ICD-10-CM

## 2013-01-30 DIAGNOSIS — R7989 Other specified abnormal findings of blood chemistry: Secondary | ICD-10-CM

## 2013-01-30 DIAGNOSIS — Z23 Encounter for immunization: Secondary | ICD-10-CM

## 2013-01-30 DIAGNOSIS — E119 Type 2 diabetes mellitus without complications: Secondary | ICD-10-CM

## 2013-01-30 DIAGNOSIS — I10 Essential (primary) hypertension: Secondary | ICD-10-CM

## 2013-01-30 MED ORDER — ESCITALOPRAM OXALATE 20 MG PO TABS: 20.0000 mg | ORAL_TABLET | Freq: Every day | ORAL | Status: DC

## 2013-01-30 MED ORDER — GABAPENTIN 600 MG PO TABS: 600.0000 mg | ORAL_TABLET | Freq: Three times a day (TID) | ORAL | Status: DC

## 2013-01-30 MED ORDER — SITAGLIPTIN PHOS-METFORMIN HCL 50-500 MG PO TABS: 1.0000 | ORAL_TABLET | Freq: Every day | ORAL | Status: DC

## 2013-01-30 NOTE — Addendum Note (Signed)
Addended by: Prescott Gum on: 01/30/2013 08:26 AM   Modules accepted: Orders

## 2013-01-30 NOTE — Telephone Encounter (Signed)
Last seen 12/13/12 [name]  10/25/12 DWM  Last glucose 11/22/12  If approved print for mail order and route to nurse

## 2013-01-30 NOTE — Progress Notes (Signed)
Patient came in for labs only.

## 2013-01-31 LAB — HEPATIC FUNCTION PANEL
ALT: 49 IU/L — ABNORMAL HIGH (ref 0–32)
AST: 50 IU/L — ABNORMAL HIGH (ref 0–40)
Albumin: 4.1 g/dL (ref 3.6–4.8)
Alkaline Phosphatase: 79 IU/L (ref 39–117)
Bilirubin, Direct: 0.1 mg/dL (ref 0.00–0.40)
Total Bilirubin: 0.3 mg/dL (ref 0.0–1.2)

## 2013-02-01 ENCOUNTER — Other Ambulatory Visit: Payer: Self-pay | Admitting: Family Medicine

## 2013-02-28 ENCOUNTER — Encounter: Payer: Self-pay | Admitting: Family Medicine

## 2013-02-28 ENCOUNTER — Ambulatory Visit (INDEPENDENT_AMBULATORY_CARE_PROVIDER_SITE_OTHER): Payer: Medicare Other | Admitting: Family Medicine

## 2013-02-28 ENCOUNTER — Encounter (INDEPENDENT_AMBULATORY_CARE_PROVIDER_SITE_OTHER): Payer: Self-pay

## 2013-02-28 VITALS — BP 151/83 | HR 84 | Temp 98.1°F | Ht 66.0 in | Wt 227.0 lb

## 2013-02-28 DIAGNOSIS — Z780 Asymptomatic menopausal state: Secondary | ICD-10-CM

## 2013-02-28 DIAGNOSIS — F3289 Other specified depressive episodes: Secondary | ICD-10-CM

## 2013-02-28 DIAGNOSIS — F32A Depression, unspecified: Secondary | ICD-10-CM | POA: Insufficient documentation

## 2013-02-28 DIAGNOSIS — K449 Diaphragmatic hernia without obstruction or gangrene: Secondary | ICD-10-CM

## 2013-02-28 DIAGNOSIS — M5137 Other intervertebral disc degeneration, lumbosacral region: Secondary | ICD-10-CM

## 2013-02-28 DIAGNOSIS — E559 Vitamin D deficiency, unspecified: Secondary | ICD-10-CM

## 2013-02-28 DIAGNOSIS — I10 Essential (primary) hypertension: Secondary | ICD-10-CM

## 2013-02-28 DIAGNOSIS — G609 Hereditary and idiopathic neuropathy, unspecified: Secondary | ICD-10-CM

## 2013-02-28 DIAGNOSIS — E119 Type 2 diabetes mellitus without complications: Secondary | ICD-10-CM

## 2013-02-28 DIAGNOSIS — M51379 Other intervertebral disc degeneration, lumbosacral region without mention of lumbar back pain or lower extremity pain: Secondary | ICD-10-CM

## 2013-02-28 DIAGNOSIS — G629 Polyneuropathy, unspecified: Secondary | ICD-10-CM

## 2013-02-28 DIAGNOSIS — M797 Fibromyalgia: Secondary | ICD-10-CM

## 2013-02-28 DIAGNOSIS — Z23 Encounter for immunization: Secondary | ICD-10-CM

## 2013-02-28 DIAGNOSIS — F329 Major depressive disorder, single episode, unspecified: Secondary | ICD-10-CM | POA: Insufficient documentation

## 2013-02-28 DIAGNOSIS — K219 Gastro-esophageal reflux disease without esophagitis: Secondary | ICD-10-CM | POA: Insufficient documentation

## 2013-02-28 DIAGNOSIS — IMO0001 Reserved for inherently not codable concepts without codable children: Secondary | ICD-10-CM

## 2013-02-28 LAB — POCT CBC
HCT, POC: 39.5 % (ref 37.7–47.9)
Hemoglobin: 13 g/dL (ref 12.2–16.2)
MCH, POC: 27.7 pg (ref 27–31.2)
MCHC: 32.8 g/dL (ref 31.8–35.4)
MPV: 8.6 fL (ref 0–99.8)
RBC: 4.7 M/uL (ref 4.04–5.48)

## 2013-02-28 NOTE — Addendum Note (Signed)
Addended by: Magdalene River on: 02/28/2013 09:31 AM   Modules accepted: Orders

## 2013-02-28 NOTE — Patient Instructions (Addendum)
Continue current medications. Continue good therapeutic lifestyle changes which include good diet and exercise. Fall precautions discussed with patient. Schedule your flu vaccine if you haven't had it yet If you are over 67 years old - you may need Prevnar 13 or the adult Pneumonia vaccine. Continue to monitor her blood sugars regularly We will arrange for a visit with Dr. Jeral Fruit to further evaluate your chronic low back pain and peripheral neuropathy Call you with lab work is and as it is available.

## 2013-02-28 NOTE — Progress Notes (Signed)
Subjective:    [name], female    DOB: 11-Aug-1945, 67 y.o.   MRN: [id number]  HPI Pt here for follow up and management of chronic medical problems. Patient continues to complain of back pain and neuropathy in her legs. This is always worse after being on her feet and or walking for no more than 10-15 minutes. She says that when she is sitting or laying the pain is much less. She has seen orthopedics about this and they have done all that can do with injections and the injections have not helped any. Also she indicates that the statin drug is tolerated 3 days weekly. Patient does not check her blood pressures regularly at home. She says that her blood sugars usually run between 135 and 140.       Patient Active Problem List   Diagnosis Date Noted  . DDD (degenerative disc disease), lumbosacral 06/23/2012  . Fibromyalgia syndrome 06/23/2012  . Essential hypertension, benign 06/23/2012  . Osteopenia 06/23/2012  . Type II or unspecified type diabetes mellitus without mention of complication, not stated as uncontrolled 06/23/2012  . Abnormal EKG 10/29/2010  . Obesity 10/29/2010  . 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 DYSPHAGIA 10/11/2009  . PERSONAL HISTORY OF COLONIC POLYPS 10/11/2009   Outpatient Encounter Prescriptions as of 02/28/2013  Medication Sig  . ALPRAZolam (XANAX) 0.5 MG tablet Take 0.5 mg by mouth at bedtime as needed.    . ARIPiprazole (ABILIFY) 2 MG tablet Take 2 mg by mouth daily. 1/2 a day  . Biotin 5000 MCG CAPS Take 5,000 mcg by mouth 2 (two) times daily.  Marland Kitchen escitalopram (LEXAPRO) 20 MG tablet Take 1 tablet (20 mg total) by mouth daily.  Marland Kitchen esomeprazole (NEXIUM) 40 MG capsule Take 40 mg by mouth daily before breakfast.    . ezetimibe (ZETIA) 10 MG tablet Take 10 mg by mouth daily.    Marland Kitchen gabapentin (NEURONTIN) 600 MG tablet Take 1 tablet (600 mg total) by mouth 3 (three) times daily.  Marland Kitchen lisinopril-hydrochlorothiazide (PRINZIDE,ZESTORETIC) 20-25  MG per tablet Take 1 tablet by mouth daily.    . Omega-3 Fatty Acids (FISH OIL) 1000 MG CAPS Take by mouth 2 (two) times daily.    . Pitavastatin Calcium (LIVALO) 2 MG TABS Take 2 mg by mouth every Monday, Wednesday, and Friday.  Marland Kitchen PREMARIN vaginal cream USE AS DIRECTED  . sitaGLIPtin-metformin (JANUMET) 50-500 MG per tablet Take 1 tablet by mouth daily.  Marland Kitchen albuterol (PROVENTIL HFA;VENTOLIN HFA) 108 (90 BASE) MCG/ACT inhaler Inhale 2 puffs into the lungs every 6 (six) hours as needed for wheezing.  . cholecalciferol (VITAMIN D) 1000 UNITS tablet Take 2,000 Units by mouth daily.  . [DISCONTINUED] HYDROcodone-acetaminophen (NORCO) 7.5-325 MG per tablet Take 1 tablet by mouth every 6 (six) hours as needed.    . [DISCONTINUED] MOVIPREP 100 G SOLR moviprep as directed. No substitutions    Review of Systems  Constitutional: Negative.   HENT: Negative.   Eyes: Negative.   Respiratory: Negative.   Cardiovascular: Negative.   Gastrointestinal: Negative.   Endocrine: Negative.   Genitourinary: Negative.   Musculoskeletal: Negative.        Neuropathy in feet is worse  Skin: Positive for color change (place on left jaw- going to [facility] on 04/11/13) and rash (around mouth).  Allergic/Immunologic: Negative.   Neurological: Negative.   Hematological: Negative.   Psychiatric/Behavioral: Negative.    Repeat blood pressure 120/88 left arm sitting    Objective:   Physical Exam  Nursing note and vitals reviewed. Constitutional: She is oriented to person, place, and time. She appears well-developed and well-nourished. No distress.  HENT:  Head: Normocephalic and atraumatic.  Right Ear: External ear normal.  Left Ear: External ear normal.  Nose: Nose normal.  Mouth/Throat: Oropharynx is clear and moist. No oropharyngeal exudate.  Eyes: Conjunctivae and EOM are normal. Pupils are equal, round, and reactive to light. Right eye exhibits no discharge. Left eye exhibits no discharge. No scleral icterus.   Neck: Normal range of motion. Neck supple. No thyromegaly present.  Cardiovascular: Normal rate, regular rhythm, normal heart sounds and intact distal pulses.  Exam reveals no gallop and no friction rub.   No murmur heard. At 72 per minute  Pulmonary/Chest: Effort normal and breath sounds normal. No respiratory distress. She has no wheezes. She has no rales. She exhibits no tenderness.  Abdominal: Soft. Bowel sounds are normal. She exhibits no mass. There is no tenderness. There is no rebound and no guarding.  No inguinal adenopathy  Musculoskeletal: Normal range of motion. She exhibits no edema and no tenderness.  Leg raising and hip abduction were good. There was no palpable back tenderness.  Lymphadenopathy:    She has no cervical adenopathy.  Neurological: She is alert and oriented to person, place, and time. She has normal reflexes. No cranial nerve deficit.  Skin: Skin is warm and dry.  Psychiatric: She has a normal mood and affect. Her behavior is normal. Judgment and thought content normal.   BP 151/83  Pulse 84  Temp(Src) 98.1 F (36.7 C) (Oral)  Ht 5\' 6"  (1.676 m)  Wt 227 lb (102.967 kg)  BMI 36.66 kg/m2        Assessment & Plan:   1. DDD (degenerative disc disease), lumbosacral   2. Essential hypertension, benign   3. Fibromyalgia syndrome   4. Type II or unspecified type diabetes mellitus without mention of complication, not stated as uncontrolled   5. Vitamin D deficiency   6. Need for prophylactic vaccination against Streptococcus pneumoniae (pneumococcus)   7. Postmenopausal   8. Depression   9. GERD (gastroesophageal reflux disease)   10. Hiatal hernia with gastroesophageal reflux   11. Peripheral neuropathy    Orders Placed This Encounter  Procedures  . DG Bone Density    Standing Status: Future     Number of Occurrences:      Standing Expiration Date: 04/30/2014    Order Specific Question:  Reason for Exam (SYMPTOM  OR DIAGNOSIS REQUIRED)    Answer:   postmenapausal    Order Specific Question:  Preferred imaging location?    Answer:  Internal  . Pneumococcal conjugate vaccine 13-valent  . Hepatic function panel  . Brain natriuretic peptide  . NMR, lipoprofile  . Vit D  25 hydroxy (rtn osteoporosis monitoring)  . POCT CBC  . POCT glycosylated hemoglobin (Hb A1C)   Meds ordered this encounter  Medications  . Pitavastatin Calcium (LIVALO) 2 MG TABS    Sig: Take 2 mg by mouth every Monday, Wednesday, and Friday.   Patient Instructions  Continue current medications. Continue good therapeutic lifestyle changes which include good diet and exercise. Fall precautions discussed with patient. Schedule your flu vaccine if you haven't had it yet If you are over 39 years old - you may need Prevnar 13 or the adult Pneumonia vaccine. Continue to monitor her blood sugars regularly We will arrange for a visit with Dr. Jeral Fruit to further evaluate your chronic low back pain and  peripheral neuropathy Call you with lab work is and as it is available.   Nyra Capes MD

## 2013-03-02 LAB — HEPATIC FUNCTION PANEL
ALT: 51 IU/L — ABNORMAL HIGH (ref 0–32)
AST: 51 IU/L — ABNORMAL HIGH (ref 0–40)
Albumin: 4.2 g/dL (ref 3.6–4.8)
Bilirubin, Direct: 0.11 mg/dL (ref 0.00–0.40)
Total Bilirubin: 0.3 mg/dL (ref 0.0–1.2)

## 2013-03-02 LAB — NMR, LIPOPROFILE
Cholesterol: 164 mg/dL (ref <200)
HDL Cholesterol by NMR: 52 mg/dL (ref >=40)
HDL Particle Number: 39.6 umol/L (ref >=30.5)
LDLC SERPL CALC-MCNC: 79 mg/dL (ref <100)
LP-IR Score: 86 — ABNORMAL HIGH (ref <=45)
Small LDL Particle Number: 1148 nmol/L — ABNORMAL HIGH (ref <=527)
Triglycerides by NMR: 165 mg/dL — ABNORMAL HIGH (ref <150)

## 2013-03-07 ENCOUNTER — Telehealth: Payer: Self-pay | Admitting: Family Medicine

## 2013-03-07 NOTE — Telephone Encounter (Signed)
Left message for patient to call back  

## 2013-03-20 ENCOUNTER — Telehealth: Payer: Self-pay | Admitting: Family Medicine

## 2013-03-21 ENCOUNTER — Other Ambulatory Visit: Payer: Self-pay

## 2013-03-21 MED ORDER — ESOMEPRAZOLE MAGNESIUM 40 MG PO CPDR: 40.0000 mg | DELAYED_RELEASE_CAPSULE | Freq: Every day | ORAL | Status: DC

## 2013-03-21 MED ORDER — ARIPIPRAZOLE 2 MG PO TABS: ORAL_TABLET | ORAL | Status: DC

## 2013-03-21 NOTE — Telephone Encounter (Signed)
Last sen 10/25/12  DWM  If approved print for mail order and route to nurse

## 2013-03-21 NOTE — Telephone Encounter (Signed)
These 2 prescriptions are okay

## 2013-03-23 ENCOUNTER — Other Ambulatory Visit: Payer: Self-pay | Admitting: *Deleted

## 2013-03-23 NOTE — Telephone Encounter (Signed)
Spoke with [name] and she will discuss meds with pt.

## 2013-03-24 ENCOUNTER — Other Ambulatory Visit: Payer: Self-pay | Admitting: Neurosurgery

## 2013-03-24 DIAGNOSIS — M4310 Spondylolisthesis, site unspecified: Secondary | ICD-10-CM

## 2013-03-24 MED ORDER — LISINOPRIL-HYDROCHLOROTHIAZIDE 20-25 MG PO TABS: 1.0000 | ORAL_TABLET | Freq: Every day | ORAL | Status: DC

## 2013-03-24 MED ORDER — GABAPENTIN 600 MG PO TABS: 600.0000 mg | ORAL_TABLET | Freq: Three times a day (TID) | ORAL | Status: DC

## 2013-03-24 MED ORDER — ESTROGENS, CONJUGATED 0.625 MG/GM VA CREA: TOPICAL_CREAM | VAGINAL | Status: DC

## 2013-03-24 MED ORDER — ALBUTEROL SULFATE HFA 108 (90 BASE) MCG/ACT IN AERS: 2.0000 | INHALATION_SPRAY | Freq: Four times a day (QID) | RESPIRATORY_TRACT | Status: DC | PRN

## 2013-03-24 MED ORDER — SITAGLIPTIN PHOS-METFORMIN HCL 50-500 MG PO TABS: 1.0000 | ORAL_TABLET | Freq: Every day | ORAL | Status: DC

## 2013-03-24 MED ORDER — ESOMEPRAZOLE MAGNESIUM 40 MG PO CPDR: 40.0000 mg | DELAYED_RELEASE_CAPSULE | Freq: Every day | ORAL | Status: DC

## 2013-03-24 MED ORDER — EZETIMIBE 10 MG PO TABS: 10.0000 mg | ORAL_TABLET | Freq: Every day | ORAL | Status: DC

## 2013-03-24 MED ORDER — PITAVASTATIN CALCIUM 2 MG PO TABS: 2.0000 mg | ORAL_TABLET | Freq: Every day | ORAL | Status: DC

## 2013-04-04 ENCOUNTER — Other Ambulatory Visit: Payer: Self-pay | Admitting: *Deleted

## 2013-04-04 MED ORDER — ESCITALOPRAM OXALATE 20 MG PO TABS: 20.0000 mg | ORAL_TABLET | Freq: Every day | ORAL | Status: DC

## 2013-04-11 ENCOUNTER — Other Ambulatory Visit: Payer: Self-pay | Admitting: Dermatology

## 2013-04-18 ENCOUNTER — Telehealth: Payer: Self-pay | Admitting: Family Medicine

## 2013-04-19 ENCOUNTER — Ambulatory Visit
Admission: RE | Admit: 2013-04-19 | Discharge: 2013-04-19 | Disposition: A | Discharge status: Home / Self Care | Payer: Medicare Other | Source: Ambulatory Visit | Attending: Neurosurgery | Admitting: Neurosurgery

## 2013-04-19 DIAGNOSIS — M4310 Spondylolisthesis, site unspecified: Secondary | ICD-10-CM

## 2013-04-19 NOTE — Telephone Encounter (Signed)
Patient wants a glucose meter and than you'll need to order strips and lancets

## 2013-04-19 NOTE — Telephone Encounter (Signed)
Please write prescription for this and I will sign the order

## 2013-04-21 ENCOUNTER — Other Ambulatory Visit: Payer: Self-pay | Admitting: *Deleted

## 2013-04-21 MED ORDER — ARIPIPRAZOLE 2 MG PO TABS: ORAL_TABLET | ORAL | Status: DC

## 2013-04-21 NOTE — Telephone Encounter (Signed)
Patients last refill in Dec was for Abilify 2mg . Received fax from [facility] for a 90 day rx for Abilify 10mg . Please advise on dose.

## 2013-04-24 ENCOUNTER — Encounter: Payer: Self-pay | Admitting: *Deleted

## 2013-04-24 ENCOUNTER — Telehealth: Payer: Self-pay | Admitting: Family Medicine

## 2013-04-24 DIAGNOSIS — Z20828 Contact with and (suspected) exposure to other viral communicable diseases: Secondary | ICD-10-CM

## 2013-04-24 MED ORDER — OSELTAMIVIR PHOSPHATE 75 MG PO CAPS: 75.0000 mg | ORAL_CAPSULE | Freq: Every day | ORAL | Status: DC

## 2013-04-24 NOTE — Telephone Encounter (Signed)
Med called sent in per dwm

## 2013-04-24 NOTE — Telephone Encounter (Signed)
Called into [facility] 

## 2013-04-26 ENCOUNTER — Ambulatory Visit (INDEPENDENT_AMBULATORY_CARE_PROVIDER_SITE_OTHER): Payer: Medicare Other

## 2013-04-26 ENCOUNTER — Ambulatory Visit (INDEPENDENT_AMBULATORY_CARE_PROVIDER_SITE_OTHER): Payer: Medicare Other | Admitting: Pharmacist

## 2013-04-26 VITALS — Ht 66.0 in | Wt 226.0 lb

## 2013-04-26 DIAGNOSIS — M5137 Other intervertebral disc degeneration, lumbosacral region: Secondary | ICD-10-CM

## 2013-04-26 DIAGNOSIS — M899 Disorder of bone, unspecified: Secondary | ICD-10-CM

## 2013-04-26 DIAGNOSIS — E559 Vitamin D deficiency, unspecified: Secondary | ICD-10-CM

## 2013-04-26 DIAGNOSIS — M949 Disorder of cartilage, unspecified: Secondary | ICD-10-CM

## 2013-04-26 DIAGNOSIS — M8580 Other specified disorders of bone density and structure, unspecified site: Secondary | ICD-10-CM

## 2013-04-26 DIAGNOSIS — E119 Type 2 diabetes mellitus without complications: Secondary | ICD-10-CM

## 2013-04-26 DIAGNOSIS — Z780 Asymptomatic menopausal state: Secondary | ICD-10-CM

## 2013-04-26 MED ORDER — ONETOUCH DELICA LANCETS 33G MISC: Status: DC

## 2013-04-26 MED ORDER — GLUCOSE BLOOD VI STRP: ORAL_STRIP | Status: DC

## 2013-04-26 NOTE — Patient Instructions (Signed)

## 2013-04-26 NOTE — Progress Notes (Signed)
[name], female   DOB: 08-20-1945, 68 y.o.   MRN: [id number]  [facility]opor[facility] Current Height: Height: 5\' 6"  (167.6 cm)      Max Lifetime Height:  5' 6.5" Current Weight: Weight: 226 lb (102.513 kg)       Ethnicity:Caucasian    HPI: Does pt already have a diagnosis of:  Osteopenia?  Yes Osteoporosis?  No  Back Pain?  Yes       Kyphosis?  No Prior fracture?  No Med(s) for Osteoporosis/Osteopenia:  none Med(s) previously tried for Osteoporosis/Osteopenia:  none                                                             PMH: Age at menopause:  About 68 yo Hysterectomy?  Yes Oophorectomy?  Yes HRT? Yes - Current.  Type/duration: premarin cream (vaginal), took estrogen for about 15 years in past Steroid Use?  No Thyroid med?  No History of cancer?  No History of digestive disorders (ie Crohn's)?  Yes - GERD on PPI daily Current or previous eating disorders?  No Last Vitamin D Result:  29.8 (02/28/2013) Last GFR Result:  > 89 (10/21/2012)   FH/SH: Family history of osteoporosis?  No Parent with history of hip fracture?  No Family history of breast cancer?  No Exercise?  No - limited by back pain Smoking?  No - quit in 2012 Alcohol?  No    Calcium Assessment Calcium Intake  # of servings/day  Calcium mg  Milk (8 oz) 0  x  300  = 0  Yogurt (4 oz) 0 x  200 = 0  Cheese (1 oz) 2 x  200 = 415mf  Other Calcium sources   250mg   Ca supplement 0 = 0   Estimated calcium intake per day 650mg     DEXA Results Date of Test T-Score for AP Spine L1-L4 T-Score for Total Left Hip T-Score for Total Right Hip T-Score for neck of left hip T-Score for neck of right hip  04/26/2013 0.8 -0.3 -0.2 -1.6 -1.2  03/11/2011 0.8 -0.6 -0.1 -1.7 -1.2                  Assessment: Osteopenia with stable BMD Type 2 DM - patient asked about blood glucose monitor and rx for supplies  Recommendations: 1. Discussed fracture risk and BMD results - commended patient on quitting  smoking 2 years ago which is beneficial to bone health. 2.  recommend calcium 1200mg  daily through supplementation or diet.  3.  recommend weight bearing exercise - 30 minutes at least 4 days  per week.   4.  Counseled and educated about fall risk and prevention. 5.  Given One Touch Verio IQ monitor and taught to use - reviewed BG goals.  Rx sent to mail order for test strip and lancets. Recheck DEXA:  2 years  Time spent counseling patient:  20 minutes

## 2013-05-01 ENCOUNTER — Ambulatory Visit: Payer: Medicare Other | Admitting: Family Medicine

## 2013-05-10 ENCOUNTER — Telehealth: Payer: Self-pay | Admitting: Family Medicine

## 2013-05-10 MED ORDER — LANCETS 30G MISC: Status: DC

## 2013-05-10 MED ORDER — BLOOD GLUCOSE TEST VI STRP: ORAL_STRIP | Status: DC

## 2013-05-10 MED ORDER — FREESTYLE SYSTEM KIT: PACK | Status: DC

## 2013-05-10 NOTE — Telephone Encounter (Signed)
Rx for glucometer, test strips and lancets sent to her mail order pharmacy.  Rx's state that they may dispense whichever glucometer insurance will pay for long with strips and lancets.   I also left #20 test strip samples for Verio glucometer for patient to use until mail order sends her new meter.  Patient aware.

## 2013-05-15 ENCOUNTER — Other Ambulatory Visit: Payer: Self-pay

## 2013-05-15 MED ORDER — EZETIMIBE 10 MG PO TABS: 10.0000 mg | ORAL_TABLET | Freq: Every day | ORAL | Status: DC

## 2013-05-15 MED ORDER — LISINOPRIL-HYDROCHLOROTHIAZIDE 20-25 MG PO TABS
1.0000 | ORAL_TABLET | Freq: Every day | ORAL | Status: DC
Start: 2013-05-15 — End: 2013-11-28

## 2013-05-15 NOTE — Telephone Encounter (Signed)
Last seen 02/28/13  DWM  Last lipid 10/21/12

## 2013-05-17 ENCOUNTER — Other Ambulatory Visit: Payer: Self-pay | Admitting: *Deleted

## 2013-05-17 MED ORDER — ONETOUCH DELICA LANCETS 33G MISC: Status: DC

## 2013-05-17 MED ORDER — BLOOD GLUCOSE MONITORING SUPPL DEVI: Status: DC

## 2013-05-17 MED ORDER — GLUCOSE BLOOD VI STRP: ORAL_STRIP | Status: DC

## 2013-05-18 ENCOUNTER — Encounter: Payer: Self-pay | Admitting: *Deleted

## 2013-05-19 ENCOUNTER — Other Ambulatory Visit: Payer: Self-pay | Admitting: *Deleted

## 2013-05-19 ENCOUNTER — Telehealth: Payer: Self-pay | Admitting: Family Medicine

## 2013-05-19 NOTE — Telephone Encounter (Signed)
Mandy did today

## 2013-05-20 ENCOUNTER — Telehealth: Payer: Self-pay | Admitting: Family Medicine

## 2013-05-20 NOTE — Telephone Encounter (Signed)
Spoke with pt . States been trying for 5 days to get strips  [facility] notified and rx per her med list called in . Spoke to kristin Pt aware Written rx faxed to [facility]

## 2013-06-28 ENCOUNTER — Ambulatory Visit: Payer: Medicare Other | Admitting: Family Medicine

## 2013-07-04 ENCOUNTER — Ambulatory Visit: Payer: Medicare Other | Admitting: Family Medicine

## 2013-07-06 ENCOUNTER — Ambulatory Visit: Payer: Medicare Other | Admitting: Family Medicine

## 2013-07-11 ENCOUNTER — Ambulatory Visit: Payer: Medicare Other | Admitting: Family Medicine

## 2013-07-13 ENCOUNTER — Ambulatory Visit: Payer: Medicare Other | Admitting: Family Medicine

## 2013-07-18 ENCOUNTER — Ambulatory Visit (INDEPENDENT_AMBULATORY_CARE_PROVIDER_SITE_OTHER): Payer: Medicare Other | Admitting: Family Medicine

## 2013-07-18 ENCOUNTER — Ambulatory Visit (INDEPENDENT_AMBULATORY_CARE_PROVIDER_SITE_OTHER): Payer: Medicare Other

## 2013-07-18 ENCOUNTER — Encounter: Payer: Self-pay | Admitting: Family Medicine

## 2013-07-18 VITALS — BP 105/67 | HR 82 | Temp 98.1°F | Ht 66.0 in | Wt 223.0 lb

## 2013-07-18 DIAGNOSIS — I10 Essential (primary) hypertension: Secondary | ICD-10-CM

## 2013-07-18 DIAGNOSIS — M8580 Other specified disorders of bone density and structure, unspecified site: Secondary | ICD-10-CM

## 2013-07-18 DIAGNOSIS — E559 Vitamin D deficiency, unspecified: Secondary | ICD-10-CM

## 2013-07-18 DIAGNOSIS — K219 Gastro-esophageal reflux disease without esophagitis: Secondary | ICD-10-CM

## 2013-07-18 DIAGNOSIS — M949 Disorder of cartilage, unspecified: Secondary | ICD-10-CM

## 2013-07-18 DIAGNOSIS — M899 Disorder of bone, unspecified: Secondary | ICD-10-CM

## 2013-07-18 DIAGNOSIS — E119 Type 2 diabetes mellitus without complications: Secondary | ICD-10-CM

## 2013-07-18 LAB — POCT CBC
Granulocyte percent: 59.2 %G (ref 37–80)
HCT, POC: 42 % (ref 37.7–47.9)
HEMOGLOBIN: 13.2 g/dL (ref 12.2–16.2)
Lymph, poc: 2.4 (ref 0.6–3.4)
MCH: 26.8 pg — AB (ref 27–31.2)
MCHC: 31.4 g/dL — AB (ref 31.8–35.4)
MCV: 85.3 fL (ref 80–97)
MPV: 8.4 fL (ref 0–99.8)
POC GRANULOCYTE: 4.1 (ref 2–6.9)
POC LYMPH PERCENT: 34.8 %L (ref 10–50)
Platelet Count, POC: 246 10*3/uL (ref 142–424)
RBC: 4.9 M/uL (ref 4.04–5.48)
RDW, POC: 15 %
WBC: 7 10*3/uL (ref 4.6–10.2)

## 2013-07-18 LAB — POCT GLYCOSYLATED HEMOGLOBIN (HGB A1C): Hemoglobin A1C: 7.2

## 2013-07-18 MED ORDER — SITAGLIPTIN PHOS-METFORMIN HCL 50-500 MG PO TABS: 1.0000 | ORAL_TABLET | Freq: Two times a day (BID) | ORAL | Status: DC

## 2013-07-18 NOTE — Patient Instructions (Addendum)
Medicare Annual Wellness Visit  [facility] and the medical providers at [facility] strive to bring you the best medical care.  In doing so we not only want to address your current medical conditions and concerns but also to detect new conditions early and prevent illness, disease and health-related problems.    Medicare offers a yearly Wellness Visit which allows our clinical staff to assess your need for preventative services including immunizations, lifestyle education, counseling to decrease risk of preventable diseases and screening for fall risk and other medical concerns.    This visit is provided free of charge (no copay) for all Medicare recipients. The clinical pharmacists at [facility] have begun to conduct these Wellness Visits which will also include a thorough review of all your medications.    As you primary medical provider recommend that you make an appointment for your Annual Wellness Visit if you have not done so already this year.  You may set up this appointment before you leave today or you may call back [phone]) and schedule an appointment.  Please make sure when you call that you mention that you are scheduling your Annual Wellness Visit with the clinical pharmacist so that the appointment may be made for the proper length of time.       Continue current medications. Continue good therapeutic lifestyle changes which include good diet and exercise. Fall precautions discussed with patient. If an FOBT was given today- please return it to our front desk. If you are over 69 years old - you may need Prevnar 96 or the adult Pneumonia vaccine.  Continue followup with the orthopedic surgeon regarding your back problem Try to exercise as much as possible Remember when you request a refill on Nexium to ask for omeprazole 40 We will call him with the results of her lab work and chest x-ray is services was also  are available, return the FOBT

## 2013-07-18 NOTE — Progress Notes (Signed)
Subjective:    [name], female    DOB: [birth year], 68 y.o.   MRN: [id number]  HPI Pt here for follow up and management of chronic medical problems. 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 She was followed by the orthopedic surgeon for this. She also has some problems with reflux but this is pretty stable with taking the Nexium at the current time. Her home blood sugars have been running fasting between 1:30 and 160. On health maintenance issue she is due to get an FOBT, a chest x-ray and lab work. She will also get a urine microalbumin. She requested that the next prescription refill for Nexium that she wants to try omeprazole 40 instead of Nexium 40. We asked her to call prior to refilling the prescription and request omeprazole 40.       Patient Active Problem List   Diagnosis Date Noted  . Depression 02/28/2013  . GERD (gastroesophageal reflux disease) 02/28/2013  . Hiatal hernia with gastroesophageal reflux 02/28/2013  . Peripheral neuropathy 02/28/2013  . DDD (degenerative disc disease), lumbosacral 06/23/2012  . Fibromyalgia syndrome 06/23/2012  . Essential hypertension, benign 06/23/2012  . Osteopenia 06/23/2012  . Type II or unspecified type diabetes mellitus without mention of complication, not stated as uncontrolled 06/23/2012  . Abnormal EKG 10/29/2010  . Obesity 10/29/2010  . 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 DYSPHAGIA 10/11/2009  . PERSONAL HISTORY OF COLONIC POLYPS 10/11/2009   Outpatient Encounter Prescriptions as of 07/18/2013  Medication Sig  . ALPRAZolam (XANAX) 0.5 MG tablet Take 0.5 mg by mouth at bedtime as needed.    . ARIPiprazole (ABILIFY) 2 MG tablet One tablet qd  . Biotin 5000 MCG CAPS Take 5,000 mcg by mouth 2 (two) times daily.  . Blood Glucose Monitoring Suppl DEVI Blood glucose meter One Touch Brand. Use to check BS up to BID. DX 250.02  . cholecalciferol (VITAMIN D) 1000 UNITS  tablet Take 2,000 Units by mouth daily.  Marland Kitchen conjugated estrogens (PREMARIN) vaginal cream Use q hs  . escitalopram (LEXAPRO) 20 MG tablet Take 1 tablet (20 mg total) by mouth daily.  Marland Kitchen esomeprazole (NEXIUM) 40 MG capsule Take 1 capsule (40 mg total) by mouth daily before breakfast.  . ezetimibe (ZETIA) 10 MG tablet Take 1 tablet (10 mg total) by mouth daily.  Marland Kitchen gabapentin (NEURONTIN) 600 MG tablet Take 1 tablet (600 mg total) by mouth 3 (three) times daily.  . Glucose Blood (BLOOD GLUCOSE TEST STRIPS) STRP Please dispense test strips that go with glucometer that patient's insurance will cover. Use to check BG twice daily.  DX:  250.02  . glucose blood test strip Dispense One Touch Ultra Test Strips.  Marland Kitchen glucose monitoring kit (FREESTYLE) monitoring kit Please dispense whichever glucometer patient's insurance will cover.  Use to check BG up to twice.  DX: 250.02  . Lancets 30G MISC Use to check BG twice daily.  Dx:  250.02  . lisinopril-hydrochlorothiazide (PRINZIDE,ZESTORETIC) 20-25 MG per tablet Take 1 tablet by mouth daily.  . Omega-3 Fatty Acids (FISH OIL) 1000 MG CAPS Take by mouth 2 (two) times daily.    [name] DELICA LANCETS 40H MISC Please check BS up to BID.  Marland Kitchen Pitavastatin Calcium (LIVALO) 2 MG TABS Take 1 tablet (2 mg total) by mouth daily.  . sitaGLIPtin-metformin (JANUMET) 50-500 MG per tablet Take 1 tablet by mouth daily.  . [DISCONTINUED] oseltamivir (TAMIFLU) 75 MG capsule Take 1 capsule (75 mg total)  by mouth daily.    Review of Systems  Constitutional: Negative.   HENT: Negative.   Eyes: Negative.   Respiratory: Negative.   Cardiovascular: Negative.   Gastrointestinal: Negative.   Endocrine: Negative.   Genitourinary: Negative.   Musculoskeletal: Negative.   Skin: Negative.   Allergic/Immunologic: Negative.   Neurological: Negative.   Hematological: Negative.   Psychiatric/Behavioral: Negative.        Objective:   Physical Exam  Nursing note and vitals  reviewed. Constitutional: She is oriented to person, place, and time. She appears well-developed and well-nourished. No distress.  Pleasant and in good spirits today.  HENT:  Head: Normocephalic and atraumatic.  Right Ear: External ear normal.  Left Ear: External ear normal.  Nose: Nose normal.  Mouth/Throat: Oropharynx is clear and moist. No oropharyngeal exudate.  Eyes: Conjunctivae and EOM are normal. Pupils are equal, round, and reactive to light. Right eye exhibits no discharge. Left eye exhibits no discharge. No scleral icterus.  Neck: Normal range of motion. Neck supple. No JVD present. No thyromegaly present.  No carotid bruits  Cardiovascular: Normal rate, regular rhythm, normal heart sounds and intact distal pulses.  Exam reveals no gallop and no friction rub.   No murmur heard. At 72 per minute  Pulmonary/Chest: Effort normal and breath sounds normal. No respiratory distress. She has no wheezes. She has no rales. She exhibits no tenderness.  Abdominal: Soft. Bowel sounds are normal. She exhibits no mass. There is tenderness. There is no rebound and no guarding.  Slight epigastric tenderness and no inguinal adenopathy  Musculoskeletal: Normal range of motion. She exhibits no edema and no tenderness.  Lymphadenopathy:    She has no cervical adenopathy.  Neurological: She is alert and oriented to person, place, and time. She has normal reflexes. No cranial nerve deficit.  Skin: Skin is warm and dry.  Psychiatric: She has a normal mood and affect. Her behavior is normal. Judgment and thought content normal.   BP 105/67  Pulse 82  Temp(Src) 98.1 F (36.7 C) (Oral)  Ht 5' 6"  (1.676 m)  Wt 223 lb (101.152 kg)  BMI 36.01 kg/m2  [facility] reading (PRIMARY) by  Dr. Brunilda Payor x-ray-  questionable nodule left chest                                       Assessment & Plan:  1. Essential hypertension, benign - POCT CBC - BMP8+EGFR - Hepatic function panel - NMR, lipoprofile -  DG Chest 2 View; Future  2. GERD (gastroesophageal reflux disease) - POCT CBC  3. Type II or unspecified type diabetes mellitus without mention of complication, not stated as uncontrolled - POCT CBC - POCT glycosylated hemoglobin (Hb A1C) - BMP8+EGFR - NMR, lipoprofile  4. Osteopenia - POCT CBC - Vit D  25 hydroxy (rtn osteoporosis monitoring)  5. Vitamin D deficiency - POCT CBC - Vit D  25 hydroxy (rtn osteoporosis monitoring)  Meds ordered this encounter  Medications  . sitaGLIPtin-metformin (JANUMET) 50-500 MG per tablet    Sig: Take 1 tablet by mouth 2 (two) times daily with a meal.    Dispense:  180 tablet    Refill:  3   Patient Instructions                       Medicare Annual Wellness Visit  [facility] and the medical providers at [place]  [facility] strive to bring you the best medical care.  In doing so we not only want to address your current medical conditions and concerns but also to detect new conditions early and prevent illness, disease and health-related problems.    Medicare offers a yearly Wellness Visit which allows our clinical staff to assess your need for preventative services including immunizations, lifestyle education, counseling to decrease risk of preventable diseases and screening for fall risk and other medical concerns.    This visit is provided free of charge (no copay) for all Medicare recipients. The clinical pharmacists at [facility] have begun to conduct these Wellness Visits which will also include a thorough review of all your medications.    As you primary medical provider recommend that you make an appointment for your Annual Wellness Visit if you have not done so already this year.  You may set up this appointment before you leave today or you may call back ([phone]) and schedule an appointment.  Please make sure when you call that you mention that you are scheduling your Annual Wellness Visit with  the clinical pharmacist so that the appointment may be made for the proper length of time.       Continue current medications. Continue good therapeutic lifestyle changes which include good diet and exercise. Fall precautions discussed with patient. If an FOBT was given today- please return it to our front desk. If you are over 57 years old - you may need Prevnar 79 or the adult Pneumonia vaccine.  Continue followup with the orthopedic surgeon regarding your back problem Try to exercise as much as possible Remember when you request a refill on Nexium to ask for omeprazole 40 We will call him with the results of her lab work and chest x-ray is services was also are available, return the FOBT    Arrie Senate MD

## 2013-07-19 ENCOUNTER — Other Ambulatory Visit: Payer: Self-pay | Admitting: *Deleted

## 2013-07-19 DIAGNOSIS — R911 Solitary pulmonary nodule: Secondary | ICD-10-CM

## 2013-07-19 LAB — HEPATIC FUNCTION PANEL
ALT: 46 IU/L — ABNORMAL HIGH (ref 0–32)
AST: 45 IU/L — ABNORMAL HIGH (ref 0–40)
Albumin: 4.4 g/dL (ref 3.6–4.8)
Alkaline Phosphatase: 73 IU/L (ref 39–117)
BILIRUBIN DIRECT: 0.09 mg/dL (ref 0.00–0.40)
BILIRUBIN TOTAL: 0.3 mg/dL (ref 0.0–1.2)
Total Protein: 6.7 g/dL (ref 6.0–8.5)

## 2013-07-19 LAB — BMP8+EGFR
BUN / CREAT RATIO: 16 (ref 11–26)
BUN: 14 mg/dL (ref 8–27)
CALCIUM: 9.5 mg/dL (ref 8.7–10.3)
CO2: 23 mmol/L (ref 18–29)
CREATININE: 0.87 mg/dL (ref 0.57–1.00)
Chloride: 95 mmol/L — ABNORMAL LOW (ref 97–108)
GFR calc Af Amer: 80 mL/min/{1.73_m2} (ref >59)
GFR, EST NON AFRICAN AMERICAN: 69 mL/min/{1.73_m2} (ref >59)
Glucose: 130 mg/dL — ABNORMAL HIGH (ref 65–99)
POTASSIUM: 5 mmol/L (ref 3.5–5.2)
Sodium: 137 mmol/L (ref 134–144)

## 2013-07-19 LAB — NMR, LIPOPROFILE
Cholesterol: 164 mg/dL (ref <200)
HDL Cholesterol by NMR: 49 mg/dL (ref >=40)
HDL Particle Number: 37.3 umol/L (ref >=30.5)
LDL Particle Number: 1229 nmol/L — ABNORMAL HIGH (ref <1000)
LDL Size: 20.4 nm (ref >20.5)
LDLC SERPL CALC-MCNC: 84 mg/dL (ref <100)
LP-IR SCORE: 82 — AB (ref <=45)
SMALL LDL PARTICLE NUMBER: 639 nmol/L — AB (ref <=527)
TRIGLYCERIDES BY NMR: 157 mg/dL — AB (ref <150)

## 2013-07-19 LAB — VITAMIN D 25 HYDROXY (VIT D DEFICIENCY, FRACTURES): Vit D, 25-Hydroxy: 34.7 ng/mL (ref 30.0–100.0)

## 2013-07-21 ENCOUNTER — Telehealth: Payer: Self-pay | Admitting: Family Medicine

## 2013-07-28 ENCOUNTER — Telehealth: Payer: Self-pay | Admitting: Family Medicine

## 2013-07-31 NOTE — Telephone Encounter (Signed)
Patient has spoken with Remo Lipps

## 2013-07-31 NOTE — Telephone Encounter (Signed)
I see where CT was ordered don't see where it was done or reviewed? Please review

## 2013-08-01 ENCOUNTER — Other Ambulatory Visit: Payer: Self-pay | Admitting: *Deleted

## 2013-08-01 DIAGNOSIS — R911 Solitary pulmonary nodule: Secondary | ICD-10-CM

## 2013-08-04 ENCOUNTER — Other Ambulatory Visit: Payer: Self-pay | Admitting: *Deleted

## 2013-08-04 MED ORDER — PITAVASTATIN CALCIUM 2 MG PO TABS: 2.0000 mg | ORAL_TABLET | Freq: Every day | ORAL | Status: DC

## 2013-08-29 ENCOUNTER — Telehealth: Payer: Self-pay | Admitting: Family Medicine

## 2013-08-30 NOTE — Telephone Encounter (Signed)
If there is a generic Nexium we certainly can switch to that, otherwise omeprazole 40 one daily--- please call prescription in and let patient know zetia and lovastatin are not the same kind of medication--- nurse to please discuss this with me before calling the patient

## 2013-08-30 NOTE — Telephone Encounter (Signed)
Please discuss this with me before calling the patient

## 2013-08-31 ENCOUNTER — Other Ambulatory Visit: Payer: Self-pay

## 2013-08-31 MED ORDER — ESOMEPRAZOLE MAGNESIUM 40 MG PO CPDR: 40.0000 mg | DELAYED_RELEASE_CAPSULE | Freq: Every day | ORAL | Status: DC

## 2013-08-31 MED ORDER — OMEPRAZOLE 40 MG PO CPDR: 40.0000 mg | DELAYED_RELEASE_CAPSULE | Freq: Every day | ORAL | Status: DC

## 2013-08-31 NOTE — Telephone Encounter (Signed)
lmtcb to discuss zetia Omeprazole 40 qd sent to [facility] - try this before sending to mail order

## 2013-09-01 NOTE — Telephone Encounter (Signed)
Patient aware. Says she will stay on zetia.

## 2013-09-07 ENCOUNTER — Ambulatory Visit: Payer: Medicare Other | Admitting: Family Medicine

## 2013-09-27 ENCOUNTER — Other Ambulatory Visit: Payer: Self-pay | Admitting: *Deleted

## 2013-09-27 MED ORDER — OMEPRAZOLE 40 MG PO CPDR: 40.0000 mg | DELAYED_RELEASE_CAPSULE | Freq: Every day | ORAL | Status: DC

## 2013-10-16 ENCOUNTER — Other Ambulatory Visit: Payer: Self-pay | Admitting: *Deleted

## 2013-10-16 ENCOUNTER — Telehealth: Payer: Self-pay | Admitting: Family Medicine

## 2013-10-16 MED ORDER — ARIPIPRAZOLE 2 MG PO TABS: ORAL_TABLET | ORAL | Status: DC

## 2013-10-16 NOTE — Telephone Encounter (Signed)
Done per [facility]

## 2013-11-10 ENCOUNTER — Telehealth: Payer: Self-pay | Admitting: Family Medicine

## 2013-11-11 MED ORDER — ESCITALOPRAM OXALATE 20 MG PO TABS: 20.0000 mg | ORAL_TABLET | Freq: Every day | ORAL | Status: DC

## 2013-11-11 NOTE — Telephone Encounter (Signed)
lexapro rx refilled

## 2013-11-28 ENCOUNTER — Ambulatory Visit (INDEPENDENT_AMBULATORY_CARE_PROVIDER_SITE_OTHER): Payer: Medicare Other | Admitting: Family Medicine

## 2013-11-28 ENCOUNTER — Encounter (INDEPENDENT_AMBULATORY_CARE_PROVIDER_SITE_OTHER): Payer: Self-pay

## 2013-11-28 ENCOUNTER — Encounter: Payer: Self-pay | Admitting: Family Medicine

## 2013-11-28 VITALS — BP 128/83 | HR 84 | Temp 98.5°F | Ht 66.0 in | Wt 223.0 lb

## 2013-11-28 DIAGNOSIS — R5381 Other malaise: Secondary | ICD-10-CM

## 2013-11-28 DIAGNOSIS — M5137 Other intervertebral disc degeneration, lumbosacral region: Secondary | ICD-10-CM

## 2013-11-28 DIAGNOSIS — M797 Fibromyalgia: Secondary | ICD-10-CM

## 2013-11-28 DIAGNOSIS — E119 Type 2 diabetes mellitus without complications: Secondary | ICD-10-CM

## 2013-11-28 DIAGNOSIS — R5383 Other fatigue: Secondary | ICD-10-CM

## 2013-11-28 DIAGNOSIS — K219 Gastro-esophageal reflux disease without esophagitis: Secondary | ICD-10-CM

## 2013-11-28 DIAGNOSIS — E559 Vitamin D deficiency, unspecified: Secondary | ICD-10-CM

## 2013-11-28 DIAGNOSIS — IMO0001 Reserved for inherently not codable concepts without codable children: Secondary | ICD-10-CM

## 2013-11-28 DIAGNOSIS — I10 Essential (primary) hypertension: Secondary | ICD-10-CM

## 2013-11-28 LAB — POCT CBC
Granulocyte percent: 56.8 %G (ref 37–80)
HCT, POC: 40.1 % (ref 37.7–47.9)
Hemoglobin: 12.7 g/dL (ref 12.2–16.2)
Lymph, poc: 2.4 (ref 0.6–3.4)
MCH, POC: 27 pg (ref 27–31.2)
MCHC: 31.6 g/dL — AB (ref 31.8–35.4)
MCV: 85.6 fL (ref 80–97)
MPV: 9 fL (ref 0–99.8)
POC Granulocyte: 3.6 (ref 2–6.9)
POC LYMPH PERCENT: 36.8 %L (ref 10–50)
Platelet Count, POC: 242 10*3/uL (ref 142–424)
RBC: 4.7 M/uL (ref 4.04–5.48)
RDW, POC: 15 %
WBC: 6.4 10*3/uL (ref 4.6–10.2)

## 2013-11-28 LAB — POCT GLYCOSYLATED HEMOGLOBIN (HGB A1C): Hemoglobin A1C: 7.7

## 2013-11-28 MED ORDER — LISINOPRIL-HYDROCHLOROTHIAZIDE 20-25 MG PO TABS: 1.0000 | ORAL_TABLET | Freq: Every day | ORAL | Status: DC

## 2013-11-28 MED ORDER — ARIPIPRAZOLE 2 MG PO TABS: ORAL_TABLET | ORAL | Status: DC

## 2013-11-28 MED ORDER — SITAGLIPTIN PHOS-METFORMIN HCL 50-500 MG PO TABS: 1.0000 | ORAL_TABLET | Freq: Two times a day (BID) | ORAL | Status: DC

## 2013-11-28 MED ORDER — SITAGLIPTIN PHOS-METFORMIN HCL 50-1000 MG PO TABS: 1.0000 | ORAL_TABLET | Freq: Two times a day (BID) | ORAL | Status: DC

## 2013-11-28 MED ORDER — ESCITALOPRAM OXALATE 20 MG PO TABS: 20.0000 mg | ORAL_TABLET | Freq: Every day | ORAL | Status: DC

## 2013-11-28 MED ORDER — PITAVASTATIN CALCIUM 2 MG PO TABS: 2.0000 mg | ORAL_TABLET | Freq: Every day | ORAL | Status: DC

## 2013-11-28 MED ORDER — ESTROGENS, CONJUGATED 0.625 MG/GM VA CREA: TOPICAL_CREAM | VAGINAL | Status: DC

## 2013-11-28 MED ORDER — GABAPENTIN 600 MG PO TABS: 600.0000 mg | ORAL_TABLET | Freq: Three times a day (TID) | ORAL | Status: DC

## 2013-11-28 MED ORDER — EZETIMIBE 10 MG PO TABS: 10.0000 mg | ORAL_TABLET | Freq: Every day | ORAL | Status: DC

## 2013-11-28 MED ORDER — ALPRAZOLAM 0.5 MG PO TABS: 0.5000 mg | ORAL_TABLET | Freq: Every evening | ORAL | Status: DC | PRN

## 2013-11-28 MED ORDER — OMEPRAZOLE 40 MG PO CPDR: 40.0000 mg | DELAYED_RELEASE_CAPSULE | Freq: Every day | ORAL | Status: DC

## 2013-11-28 NOTE — Patient Instructions (Addendum)
Medicare Annual Wellness Visit  [facility] and the medical providers at [facility] strive to bring you the best medical care.  In doing so we not only want to address your current medical conditions and concerns but also to detect new conditions early and prevent illness, disease and health-related problems.    Medicare offers a yearly Wellness Visit which allows our clinical staff to assess your need for preventative services including immunizations, lifestyle education, counseling to decrease risk of preventable diseases and screening for fall risk and other medical concerns.    This visit is provided free of charge (no copay) for all Medicare recipients. The clinical pharmacists at [facility] have begun to conduct these Wellness Visits which will also include a thorough review of all your medications.    As you primary medical provider recommend that you make an appointment for your Annual Wellness Visit if you have not done so already this year.  You may set up this appointment before you leave today or you may call back ([phone]) and schedule an appointment.  Please make sure when you call that you mention that you are scheduling your Annual Wellness Visit with the clinical pharmacist so that the appointment may be made for the proper length of time.     Continue current medications. Continue good therapeutic lifestyle changes which include good diet and exercise. Fall precautions discussed with patient. If an FOBT was given today- please return it to our front desk. If you are over 47 years old - you may need Prevnar 43 or the adult Pneumonia vaccine.  Flu Shots will be available at our office starting mid- September. Please call and schedule a FLU CLINIC APPOINTMENT.   Try to exercise as much is possible since the exercise is limited because of your back pain, at least try to walk as much as possible. We will call you  with the results of your lab work as soon as this is available Please continue to drink plenty of water Monitor your blood sugars closely at home with the sheet that I gave you after the visit today. We will be adjusting her medication once we have your lab work back In reviewing the CT scan from April there were no pulmonary nodules.

## 2013-11-28 NOTE — Progress Notes (Signed)
Subjective:    [name], female    DOB: 1945-05-24, 68 y.o.   MRN: [id number]  HPI Pt here for follow up and management of chronic medical problems. The patient brings in her blood sugars for the past couple of months. The fasting blood sugars are running in the 120-140 range but higher recently fasting. The patient does complain of more fatigue. She is also due to get lab work today. All her medications will be refilled. She is also due for an eye exam. She will set up herself for a pelvic exam in the next couple of months. The last CT scan of the patient's lungs was done in April of this year. The result showed no pulmonary nodules.        Patient Active Problem List   Diagnosis Date Noted  . Depression 02/28/2013  . GERD (gastroesophageal reflux disease) 02/28/2013  . Hiatal hernia with gastroesophageal reflux 02/28/2013  . Peripheral neuropathy 02/28/2013  . DDD (degenerative disc disease), lumbosacral 06/23/2012  . Fibromyalgia syndrome 06/23/2012  . Essential hypertension, benign 06/23/2012  . Osteopenia 06/23/2012  . Type II or unspecified type diabetes mellitus without mention of complication, not stated as uncontrolled 06/23/2012  . Abnormal EKG 10/29/2010  . Obesity 10/29/2010  . 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 DYSPHAGIA 10/11/2009  . PERSONAL HISTORY OF COLONIC POLYPS 10/11/2009   Outpatient Encounter Prescriptions as of 11/28/2013  Medication Sig  . ALPRAZolam (XANAX) 0.5 MG tablet Take 0.5 mg by mouth at bedtime as needed.    . ARIPiprazole (ABILIFY) 2 MG tablet One tablet qd  . Biotin 5000 MCG CAPS Take 5,000 mcg by mouth 2 (two) times daily.  . Blood Glucose Monitoring Suppl DEVI Blood glucose meter One Touch Brand. Use to check BS up to BID. DX 250.02  . cholecalciferol (VITAMIN D) 1000 UNITS tablet Take 2,000 Units by mouth daily.  Marland Kitchen conjugated estrogens (PREMARIN) vaginal cream Use q hs  . escitalopram (LEXAPRO) 20 MG tablet  Take 1 tablet (20 mg total) by mouth daily.  Marland Kitchen ezetimibe (ZETIA) 10 MG tablet Take 1 tablet (10 mg total) by mouth daily.  Marland Kitchen gabapentin (NEURONTIN) 600 MG tablet Take 1 tablet (600 mg total) by mouth 3 (three) times daily.  . Glucose Blood (BLOOD GLUCOSE TEST STRIPS) STRP Please dispense test strips that go with glucometer that patient's insurance will cover. Use to check BG twice daily.  DX:  250.02  . glucose monitoring kit (FREESTYLE) monitoring kit Please dispense whichever glucometer patient's insurance will cover.  Use to check BG up to twice.  DX: 250.02  . Lancets 30G MISC Use to check BG twice daily.  Dx:  250.02  . lisinopril-hydrochlorothiazide (PRINZIDE,ZESTORETIC) 20-25 MG per tablet Take 1 tablet by mouth daily.  . Omega-3 Fatty Acids (FISH OIL) 1000 MG CAPS Take by mouth 2 (two) times daily.    Marland Kitchen omeprazole (PRILOSEC) 40 MG capsule Take 1 capsule (40 mg total) by mouth daily.  [name] DELICA LANCETS 82U MISC Please check BS up to BID.  Marland Kitchen Pitavastatin Calcium (LIVALO) 2 MG TABS Take 1 tablet (2 mg total) by mouth daily.  . sitaGLIPtin-metformin (JANUMET) 50-500 MG per tablet Take 1 tablet by mouth 2 (two) times daily with a meal.  . [DISCONTINUED] esomeprazole (NEXIUM) 40 MG capsule Take 1 capsule (40 mg total) by mouth daily before breakfast.  . [DISCONTINUED] glucose blood test strip Dispense One Touch Ultra Test Strips.    Review of Systems  Constitutional: Positive for fatigue.  HENT: Negative.   Eyes: Negative.   Respiratory: Negative.   Cardiovascular: Negative.   Gastrointestinal: Negative.   Endocrine: Negative.        Elevated Blood sugar levels  Genitourinary: Negative.   Musculoskeletal: Negative.   Skin: Negative.   Allergic/Immunologic: Negative.   Neurological: Negative.   Hematological: Negative.   Psychiatric/Behavioral: Negative.        Objective:   Physical Exam  Nursing note and vitals reviewed. Constitutional: She is oriented to person,  place, and time. She appears well-developed and well-nourished. No distress.  The patient is pleasant alert and cooperative  HENT:  Head: Normocephalic and atraumatic.  Right Ear: External ear normal.  Left Ear: External ear normal.  Nose: Nose normal.  Mouth/Throat: Oropharynx is clear and moist. No oropharyngeal exudate.  Eyes: Conjunctivae and EOM are normal. Pupils are equal, round, and reactive to light. Right eye exhibits no discharge. Left eye exhibits no discharge. No scleral icterus.  Neck: Normal range of motion. Neck supple. No thyromegaly present.  No carotid bruits  Cardiovascular: Normal rate, regular rhythm, normal heart sounds and intact distal pulses.  Exam reveals no gallop and no friction rub.   No murmur heard. There is a regular rate and rhythm at 72 per minute without murmurs  Pulmonary/Chest: Effort normal and breath sounds normal. No respiratory distress. She has no wheezes. She has no rales. She exhibits no tenderness.  No axillary nodes  Abdominal: Soft. Bowel sounds are normal. She exhibits no mass. There is no tenderness. There is no rebound and no guarding.  Minimal epigastric tenderness. Obese without masses or tenderness or inguinal nodes  Musculoskeletal: Normal range of motion. She exhibits no edema and no tenderness.  Lymphadenopathy:    She has no cervical adenopathy.  Neurological: She is alert and oriented to person, place, and time. She has normal reflexes. No cranial nerve deficit.  There is some limitation of mobility due to her chronic back pain. Patient has bilateral knee replacement  Skin: Skin is warm and dry. No rash noted.  Psychiatric: She has a normal mood and affect. Her behavior is normal. Judgment and thought content normal.   BP 128/83  Pulse 84  Temp(Src) 98.5 F (36.9 C) (Oral)  Ht 5' 6"  (1.676 m)  Wt 223 lb (101.152 kg)  BMI 36.01 kg/m2  Results for orders placed in visit on 11/28/13  POCT GLYCOSYLATED HEMOGLOBIN (HGB A1C)       Result Value Ref Range   Hemoglobin A1C 7.7       The patient was made aware of the hemoglobin A1c result before she left the office. As a result of this her Janumet will be increased to 50/1,000 twice daily.    Assessment & Plan:  1. Essential hypertension, benign - POCT CBC - BMP8+EGFR - Hepatic function panel - NMR, lipoprofile  2. Gastroesophageal reflux disease, esophagitis presence not specified - POCT CBC  3. Type II or unspecified type diabetes mellitus without mention of complication, not stated as uncontrolled - POCT CBC - POCT glycosylated hemoglobin (Hb A1C)  4. Vitamin D deficiency - Vit D  25 hydroxy (rtn osteoporosis monitoring)  5. DDD (degenerative disc disease), lumbosacral  6. Fibromyalgia syndrome  All of the patient's medications will be renewed Meds ordered this encounter  Medications  . DISCONTD: sitaGLIPtin-metformin (JANUMET) 50-500 MG per tablet    Sig: Take 1 tablet by mouth 2 (two) times daily with a meal.    Dispense:  180 tablet    Refill:  1  . Pitavastatin Calcium (LIVALO) 2 MG TABS    Sig: Take 1 tablet (2 mg total) by mouth daily.    Dispense:  90 tablet    Refill:  1  . omeprazole (PRILOSEC) 40 MG capsule    Sig: Take 1 capsule (40 mg total) by mouth daily.    Dispense:  90 capsule    Refill:  1  . lisinopril-hydrochlorothiazide (PRINZIDE,ZESTORETIC) 20-25 MG per tablet    Sig: Take 1 tablet by mouth daily.    Dispense:  90 tablet    Refill:  1  . gabapentin (NEURONTIN) 600 MG tablet    Sig: Take 1 tablet (600 mg total) by mouth 3 (three) times daily.    Dispense:  270 tablet    Refill:  1  . ezetimibe (ZETIA) 10 MG tablet    Sig: Take 1 tablet (10 mg total) by mouth daily.    Dispense:  90 tablet    Refill:  1  . escitalopram (LEXAPRO) 20 MG tablet    Sig: Take 1 tablet (20 mg total) by mouth daily.    Dispense:  90 tablet    Refill:  1  . conjugated estrogens (PREMARIN) vaginal cream    Sig: Use q hs    Dispense:  90 g      Refill:  1  . ARIPiprazole (ABILIFY) 2 MG tablet    Sig: One tablet qd    Dispense:  90 tablet    Refill:  1  . ALPRAZolam (XANAX) 0.5 MG tablet    Sig: Take 1 tablet (0.5 mg total) by mouth at bedtime as needed.    Dispense:  90 tablet    Refill:  1  . sitaGLIPtin-metformin (JANUMET) 50-1000 MG per tablet    Sig: Take 1 tablet by mouth 2 (two) times daily with a meal.    Dispense:  180 tablet    Refill:  3     Patient Instructions                       Medicare Annual Wellness Visit   and the medical providers at [facility] strive to bring you the best medical care.  In doing so we not only want to address your current medical conditions and concerns but also to detect new conditions early and prevent illness, disease and health-related problems.    Medicare offers a yearly Wellness Visit which allows our clinical staff to assess your need for preventative services including immunizations, lifestyle education, counseling to decrease risk of preventable diseases and screening for fall risk and other medical concerns.    This visit is provided free of charge (no copay) for all Medicare recipients. The clinical pharmacists at [facility] have begun to conduct these Wellness Visits which will also include a thorough review of all your medications.    As you primary medical provider recommend that you make an appointment for your Annual Wellness Visit if you have not done so already this year.  You may set up this appointment before you leave today or you may call back ([phone]) and schedule an appointment.  Please make sure when you call that you mention that you are scheduling your Annual Wellness Visit with the clinical pharmacist so that the appointment may be made for the proper length of time.     Continue current medications. Continue good therapeutic lifestyle changes which include  good diet and exercise. Fall  precautions discussed with patient. If an FOBT was given today- please return it to our front desk. If you are over 11 years old - you may need Prevnar 11 or the adult Pneumonia vaccine.  Flu Shots will be available at our office starting mid- September. Please call and schedule a FLU CLINIC APPOINTMENT.   Try to exercise as much is possible since the exercise is limited because of your back pain, at least try to walk as much as possible. We will call you with the results of your lab work as soon as this is available Please continue to drink plenty of water Monitor your blood sugars closely at home with the sheet that I gave you after the visit today. We will be adjusting her medication once we have your lab work back In reviewing the CT scan from April there were no pulmonary nodules.   Arrie Senate MD

## 2013-11-29 LAB — NMR, LIPOPROFILE
Cholesterol: 151 mg/dL (ref 100–199)
HDL Cholesterol by NMR: 41 mg/dL (ref >39)
HDL Particle Number: 33.9 umol/L (ref >=30.5)
LDL PARTICLE NUMBER: 1260 nmol/L — AB (ref <1000)
LDL Size: 20 nm (ref >20.5)
LDLC SERPL CALC-MCNC: 74 mg/dL (ref 0–99)
LP-IR SCORE: 76 — AB (ref <=45)
Small LDL Particle Number: 863 nmol/L — ABNORMAL HIGH (ref <=527)
TRIGLYCERIDES BY NMR: 180 mg/dL — AB (ref 0–149)

## 2013-11-29 LAB — BMP8+EGFR
BUN/Creatinine Ratio: 15 (ref 11–26)
BUN: 13 mg/dL (ref 8–27)
CHLORIDE: 94 mmol/L — AB (ref 97–108)
CO2: 23 mmol/L (ref 18–29)
Calcium: 9.6 mg/dL (ref 8.7–10.3)
Creatinine, Ser: 0.85 mg/dL (ref 0.57–1.00)
GFR calc Af Amer: 81 mL/min/{1.73_m2} (ref >59)
GFR calc non Af Amer: 71 mL/min/{1.73_m2} (ref >59)
GLUCOSE: 138 mg/dL — AB (ref 65–99)
POTASSIUM: 4.7 mmol/L (ref 3.5–5.2)
Sodium: 136 mmol/L (ref 134–144)

## 2013-11-29 LAB — HEPATIC FUNCTION PANEL
ALT: 63 IU/L — ABNORMAL HIGH (ref 0–32)
AST: 68 IU/L — ABNORMAL HIGH (ref 0–40)
Albumin: 4.1 g/dL (ref 3.6–4.8)
Alkaline Phosphatase: 74 IU/L (ref 39–117)
Bilirubin, Direct: 0.11 mg/dL (ref 0.00–0.40)
TOTAL PROTEIN: 6.5 g/dL (ref 6.0–8.5)
Total Bilirubin: 0.4 mg/dL (ref 0.0–1.2)

## 2013-11-29 LAB — THYROID PANEL WITH TSH
Free Thyroxine Index: 2.2 (ref 1.2–4.9)
T3 Uptake Ratio: 24 % (ref 24–39)
T4, Total: 9.3 ug/dL (ref 4.5–12.0)
TSH: 1.89 u[IU]/mL (ref 0.450–4.500)

## 2013-11-29 LAB — VITAMIN D 25 HYDROXY (VIT D DEFICIENCY, FRACTURES): Vit D, 25-Hydroxy: 37.2 ng/mL (ref 30.0–100.0)

## 2013-12-05 ENCOUNTER — Telehealth: Payer: Self-pay | Admitting: *Deleted

## 2014-01-04 ENCOUNTER — Telehealth: Payer: Self-pay | Admitting: Pharmacist

## 2014-01-04 ENCOUNTER — Ambulatory Visit (INDEPENDENT_AMBULATORY_CARE_PROVIDER_SITE_OTHER): Payer: Medicare Other | Admitting: Pharmacist

## 2014-01-04 ENCOUNTER — Encounter: Payer: Self-pay | Admitting: Pharmacist

## 2014-01-04 ENCOUNTER — Encounter (INDEPENDENT_AMBULATORY_CARE_PROVIDER_SITE_OTHER): Payer: Self-pay

## 2014-01-04 VITALS — BP 128/78 | HR 80 | Ht 66.0 in | Wt 223.0 lb

## 2014-01-04 DIAGNOSIS — Z0000 Encounter for general adult medical examination without abnormal findings: Secondary | ICD-10-CM

## 2014-01-04 DIAGNOSIS — Z23 Encounter for immunization: Secondary | ICD-10-CM

## 2014-01-04 MED ORDER — ESOMEPRAZOLE MAGNESIUM 40 MG PO CPDR: 40.0000 mg | DELAYED_RELEASE_CAPSULE | Freq: Every day | ORAL | Status: DC

## 2014-01-04 NOTE — Progress Notes (Signed)
Subjective:    [name] is a 68 y.o. female who presents for Medicare Initial Wellness Visit and follow up diabetes. Recently increased Janumet 50/1000mg from qd to bid. Patient reports BG ranges from 114 to 158.  She is checking regularly qd.  Preventive Screening-Counseling & Management  Tobacco History  Smoking status  . Former Smoker -- 1.00 packs/day  . Types: Cigarettes  . Start date: 11/07/1975  . Quit date: 06/24/2010  Smokeless tobacco  . Never Used     Current Problems (verified) Patient Active Problem List   Diagnosis Date Noted  . Depression 02/28/2013  . GERD (gastroesophageal reflux disease) 02/28/2013  . Hiatal hernia with gastroesophageal reflux 02/28/2013  . Peripheral neuropathy 02/28/2013  . DDD (degenerative disc disease), lumbosacral 06/23/2012  . Fibromyalgia syndrome 06/23/2012  . Essential hypertension, benign 06/23/2012  . Osteopenia 06/23/2012  . Type II or unspecified type diabetes mellitus without mention of complication, not stated as uncontrolled 06/23/2012  . Abnormal EKG 10/29/2010  . Obesity due to excess calories 10/29/2010  . 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 DYSPHAGIA 10/11/2009  . PERSONAL HISTORY OF COLONIC POLYPS 10/11/2009    Medications Prior to Visit Current Outpatient Prescriptions on File Prior to Visit  Medication Sig Dispense Refill  . ALPRAZolam (XANAX) 0.5 MG tablet Take 1 tablet (0.5 mg total) by mouth at bedtime as needed.  90 tablet  1  . Biotin 5000 MCG CAPS Take 5,000 mcg by mouth daily.       . Blood Glucose Monitoring Suppl DEVI Blood glucose meter One Touch Brand. Use to check BS up to BID. DX 250.02  1 each  0  . cholecalciferol (VITAMIN D) 1000 UNITS tablet Take 1,000 Units by mouth daily.       Marland Kitchen escitalopram (LEXAPRO) 20 MG tablet Take 1 tablet (20 mg total) by mouth daily.  90 tablet  1  . ezetimibe (ZETIA) 10 MG tablet Take 1 tablet (10 mg total) by mouth daily.  90 tablet  1  . gabapentin  (NEURONTIN) 600 MG tablet Take 1 tablet (600 mg total) by mouth 3 (three) times daily.  270 tablet  1  . Glucose Blood (BLOOD GLUCOSE TEST STRIPS) STRP Please dispense test strips that go with glucometer that patient's insurance will cover. Use to check BG twice daily.  DX:  250.02  200 each  2  . glucose monitoring kit (FREESTYLE) monitoring kit Please dispense whichever glucometer patient's insurance will cover.  Use to check BG up to twice.  DX: 250.02  1 each  0  . Lancets 30G MISC Use to check BG twice daily.  Dx:  250.02  200 each  2  . lisinopril-hydrochlorothiazide (PRINZIDE,ZESTORETIC) 20-25 MG per tablet Take 1 tablet by mouth daily.  90 tablet  1  . Omega-3 Fatty Acids (FISH OIL) 1000 MG CAPS Take by mouth 2 (two) times daily.        Marland Kitchen omeprazole (PRILOSEC) 40 MG capsule Take 1 capsule (40 mg total) by mouth daily.  90 capsule  1  . ONETOUCH DELICA LANCETS 16X MISC Please check BS up to BID.  100 each  1  . Pitavastatin Calcium (LIVALO) 2 MG TABS Take 1 tablet (2 mg total) by mouth daily.  90 tablet  1  . sitaGLIPtin-metformin (JANUMET) 50-1000 MG per tablet Take 1 tablet by mouth 2 (two) times daily with a meal.  180 tablet  3   No current facility-administered medications on file prior to visit.  Patient  states that since changing from Nexium to omeprazole her GERD has worsened.  Would like to change back to Nexium or the generic esomeprazole if available.   Current Medications (verified) Current Outpatient Prescriptions  Medication Sig Dispense Refill  . ALPRAZolam (XANAX) 0.5 MG tablet Take 1 tablet (0.5 mg total) by mouth at bedtime as needed.  90 tablet  1  . ARIPiprazole (ABILIFY) 2 MG tablet Take 1 mg by mouth once. One tablet qd      . Biotin 5000 MCG CAPS Take 5,000 mcg by mouth daily.       . Blood Glucose Monitoring Suppl DEVI Blood glucose meter One Touch Brand. Use to check BS up to BID. DX 250.02  1 each  0  . cholecalciferol (VITAMIN D) 1000 UNITS tablet Take 1,000  Units by mouth daily.       Marland Kitchen conjugated estrogens (PREMARIN) vaginal cream Place 1 Applicatorful vaginally at bedtime as needed. Use q hs      . escitalopram (LEXAPRO) 20 MG tablet Take 1 tablet (20 mg total) by mouth daily.  90 tablet  1  . ezetimibe (ZETIA) 10 MG tablet Take 1 tablet (10 mg total) by mouth daily.  90 tablet  1  . gabapentin (NEURONTIN) 600 MG tablet Take 1 tablet (600 mg total) by mouth 3 (three) times daily.  270 tablet  1  . Glucose Blood (BLOOD GLUCOSE TEST STRIPS) STRP Please dispense test strips that go with glucometer that patient's insurance will cover. Use to check BG twice daily.  DX:  250.02  200 each  2  . glucose monitoring kit (FREESTYLE) monitoring kit Please dispense whichever glucometer patient's insurance will cover.  Use to check BG up to twice.  DX: 250.02  1 each  0  . Lancets 30G MISC Use to check BG twice daily.  Dx:  250.02  200 each  2  . lisinopril-hydrochlorothiazide (PRINZIDE,ZESTORETIC) 20-25 MG per tablet Take 1 tablet by mouth daily.  90 tablet  1  . Omega-3 Fatty Acids (FISH OIL) 1000 MG CAPS Take by mouth 2 (two) times daily.        Marland Kitchen omeprazole (PRILOSEC) 40 MG capsule Take 1 capsule (40 mg total) by mouth daily.  90 capsule  1  . ONETOUCH DELICA LANCETS 33A MISC Please check BS up to BID.  100 each  1  . Pitavastatin Calcium (LIVALO) 2 MG TABS Take 1 tablet (2 mg total) by mouth daily.  90 tablet  1  . sitaGLIPtin-metformin (JANUMET) 50-1000 MG per tablet Take 1 tablet by mouth 2 (two) times daily with a meal.  180 tablet  3  . esomeprazole (NEXIUM) 40 MG capsule Take 1 capsule (40 mg total) by mouth daily at 12 noon.  90 capsule  1   No current facility-administered medications for this visit.     Allergies (verified) Codeine; Crestor; Erythromycin; Fenofibrate; Lyrica; Penicillins; Statins; Sulfa antibiotics; and Zocor   PAST HISTORY  Family History Family History  Problem Relation Age of Onset  . Diabetes Mother   . Heart failure  Mother   . Hypertension Mother   . Colon cancer Neg Hx   . Cirrhosis Father   . Cirrhosis Brother     Social History History  Substance Use Topics  . Smoking status: Former Smoker -- 1.00 packs/day    Types: Cigarettes    Start date: 11/07/1975    Quit date: 06/24/2010  . Smokeless tobacco: Never Used  . Alcohol Use: No  Are there smokers in your home (other than you)? No  Risk Factors Current exercise habits: The patient does not participate in regular exercise at present.  Dietary issues discussed: limiting CHO and fat in diet   Cardiac risk factors: advanced age (older than 48 for men, 48 for women), diabetes mellitus, dyslipidemia, hypertension, obesity (BMI >= 30 kg/m2), sedentary lifestyle and smoking/ tobacco exposure.  Depression Screen (Note: if answer to either of the following is "Yes", a more complete depression screening is indicated)   Over the past 2 weeks, have you felt down, depressed or hopeless? Yes  Over the past 2 weeks, have you felt little interest or pleasure in doing things? Yes  Have you lost interest or pleasure in daily life? No  Do you often feel hopeless? No  Do you cry easily over simple problems? No  Activities of Daily Living In your present state of health, do you have any difficulty performing the following activities?:  Driving? No Managing money?  No Feeding yourself? No Getting from bed to chair? No  Climbing a flight of stairs? Yes Preparing food and eating?: No Bathing or showering? Yes Getting dressed: No Getting to the toilet? No Using the toilet:No Moving around from place to place: No In the past year have you fallen or had a near fall?:No   Are you sexually active?  Yes  Do you have more than one partner?  No  Hearing Difficulties: No Do you often ask people to speak up or repeat themselves? No Do you experience ringing or noises in your ears? No Do you have difficulty understanding soft or whispered voices?  No   Do you feel that you have a problem with memory? No  Do you often misplace items? No  Do you feel safe at home?  Yes  Cognitive Testing  Alert? Yes  Normal Appearance?Yes  Oriented to person? Yes  Place? Yes   Time? Yes  Recall of three objects?  No  Can perform simple calculations? Yes  Displays appropriate judgment?Yes  Can read the correct time from a watch face?Yes   Advanced Directives have been discussed with the patient? Yes  List the Names of Other Physician/Practitioners you currently use: 1.  Marica Otter, OD - optometrist 2.  Scarlette Shorts, MD - GI 3.  Suella Broad, MD - ortho 4.  Letta Moynahan, MD - Psych  Indicate any recent Medical Services you may have received from other than [facility] providers in the past year (date may be approximate).  Immunization History  Administered Date(s) Administered  . Influenza,inj,Quad PF,36+ Mos 01/30/2013  . Influenza,inj,quad, With Preservative 01/04/2014  . Pneumococcal Conjugate-13 02/28/2013    Screening Tests Health Maintenance  Topic Date Due  . Colon Cancer Screening Annual Fobt  11/22/2013  . Influenza Vaccine  01/28/2014 (Originally 11/04/2013)  . Hemoglobin A1c  05/31/2014  . Urine Microalbumin  07/19/2014  . Ophthalmology Exam  10/24/2014  . Foot Exam  11/29/2014  . Mammogram  06/27/2015  . Tetanus/tdap  04/06/2016  . Colonoscopy  11/22/2017  . Pneumococcal Polysaccharide Vaccine Age 78 And Over  Completed  . Zostavax  Completed    All answers were reviewed with the patient and necessary referrals were made:  Cherre Robins, [facility]   01/04/2014   History reviewed: allergies, current medications, past family history, past medical history, past social history, past surgical history and problem list   Objective:   Body mass index is 36.01 kg/(m^2). BP 128/78  Pulse 80  Ht  5' 6"  (1.676 m)  Wt 223 lb (101.152 kg)  BMI 36.01 kg/m2  A1c = 7.7% (11/28/2013)   Assessment:     Initial Medicare Wellness  Visit Type 2 Dm - not at goals     Plan:     During the course of the visit the patient was educated and counseled about appropriate screening and preventive services including:    Pneumococcal vaccine - completed  Influenza vaccine - given today  Zostavax - completed  Td vaccine - UTD  Screening electrocardiogram  Screening mammography - UTD  Screening Pap smear and pelvic exam - getting this year  Bone densitometry screening - UTD  Colorectal cancer screening - colonoscopy UTD; she has FOBT at home to complete and return to office  Glaucoma screening - patient states she was seen this year but when I called to request record of visit Dr Ammie Ferrier office had her last visit as 09/2011. Recommended patient call office to either make appt or discuss why past visit not in system.  Asked for any screening results be faxed to our office.  Nutrition counseling - reviewed CHO portion sizes and limiting high fat foods.   Increase non-starchy vegetables - carrots, green bean, squash, zucchini, tomatoes, onions, peppers, spinach and other green leafy vegetables, cabbage, lettuce, cucumbers, asparagus, okra (not fried), eggplant  limit sugar and processed foods (cakes, cookies, ice cream, crackers and chips)  Increase fresh fruit but limit serving sizes 1/2 cup or about the size of tennis or baseball  limit red meat to no more than 1-2 times per week (serving size about the size of your palm)  Choose whole grains / lean proteins - whole wheat bread, quinoa, whole grain rice (1/2 cup), fish, chicken, [place]  Advanced directives: Adv Directives packet given  Discontinue omeprazole - start esomeprazole 48m 1 capsule daily.  Checked with our home health nurse about walk in tub coverage for Medicare - she suggested patient call her specific Medicare plan to inquire about coverage.   Patient Instructions (the written plan) was given to the patient.  Medicare Attestation I have personally  reviewed: The patient's medical and social history Their use of alcohol, tobacco or illicit drugs Their current medications and supplements The patient's functional ability including ADLs,fall risks, home safety risks, cognitive, and hearing and visual impairment Diet and physical activities Evidence for depression or mood disorders  The patient's weight, height, BMI, and BP/HR have been recorded in the chart.  I have made referrals, counseling, and provided education to the patient based on review of the above and I have provided the patient with a written personalized care plan for preventive services.     [facility]  01/04/2014

## 2014-01-04 NOTE — Telephone Encounter (Signed)
Called patient about Medicare covering cost of walkin tub - she was instructed to call her insurance provider to get information about coverage.  Also discussed getting eye exam - last 2013 per Dr Ammie Ferrier office.  Patient verbalized that she will call them for appt.

## 2014-01-04 NOTE — Patient Instructions (Addendum)
Health Maintenance Summary    Annual Fecal Occult test (colon cancer screening) Has test at home       INFLUENZA VACCINE Next due  Fall 2016 Done today    OPHTHALMOLOGY EXAM Next due  10/2014 Last done 10/2013    HEMOGLOBIN A1C Next Due 02/2014 Last done 11/28/2013 was 7.7%    URINE MICROALBUMIN Next Due 07/19/2014      FOOT EXAM Next Due 11/29/2014  Last done 11/2013    MAMMOGRAM Next Due 06/27/2015  Last done 06/26/2013    TETANUS/TDAP Next Due 04/06/2016  Last done 2008   Dexa - Bone Density Next Due  04/27/2015 Last done 04/26/2013   Pneumonia Vaccine Completed 02/2013    Zostavax Completed  08/2009     COLONOSCOPY Next Due 11/22/2017  Last done 11/22/2012        Preventive Care for Adults A healthy lifestyle and preventive care can promote health and wellness. Preventive health guidelines for women include the following key practices.  A routine yearly physical is a good way to check with your health care provider about your health and preventive screening. It is a chance to share any concerns and updates on your health and to receive a thorough exam.  Visit your dentist for a routine exam and preventive care every 6 months. Brush your teeth twice a day and floss once a day. Good oral hygiene prevents tooth decay and gum disease.  The frequency of eye exams is based on your age, health, family medical history, use of contact lenses, and other factors. Follow your health care provider's recommendations for frequency of eye exams.  Eat a healthy diet. Foods like vegetables, fruits, whole grains, low-fat dairy products, and lean protein foods contain the nutrients you need without too many calories. Decrease your intake of foods high in solid fats, added sugars, and salt. Eat the right amount of calories for you.Get information about a proper diet from your health care provider, if necessary.  Regular physical exercise is one of the most important things you can do for your health. Most  adults should get at least 150 minutes of moderate-intensity exercise (any activity that increases your heart rate and causes you to sweat) each week. In addition, most adults need muscle-strengthening exercises on 2 or more days a week.  Maintain a healthy weight. The body mass index (BMI) is a screening tool to identify possible weight problems. It provides an estimate of body fat based on height and weight. Your health care provider can find your BMI and can help you achieve or maintain a healthy weight.For adults 20 years and older:  A BMI below 18.5 is considered underweight.  A BMI of 18.5 to 24.9 is normal.  A BMI of 25 to 29.9 is considered overweight.  A BMI of 30 and above is considered obese.  Maintain normal blood lipids and cholesterol levels by exercising and minimizing your intake of saturated fat. Eat a balanced diet with plenty of fruit and vegetables. Blood tests for lipids and cholesterol should begin at age 43 and be repeated every 5 years. If your lipid or cholesterol levels are high, you are over 50, or you are at high risk for heart disease, you may need your cholesterol levels checked more frequently.Ongoing high lipid and cholesterol levels should be treated with medicines if diet and exercise are not working.  If you smoke, find out from your health care provider how to quit. If you do not use tobacco, do not  start.  Lung cancer screening is recommended for adults aged 71-80 years who are at high risk for developing lung cancer because of a history of smoking. A yearly low-dose CT scan of the lungs is recommended for people who have at least a 30-pack-year history of smoking and are a current smoker or have quit within the past 15 years. A pack year of smoking is smoking an average of 1 pack of cigarettes a day for 1 year (for example: 1 pack a day for 30 years or 2 packs a day for 15 years). Yearly screening should continue until the smoker has stopped smoking for at  least 15 years. Yearly screening should be stopped for people who develop a health problem that would prevent them from having lung cancer treatment.  If you are pregnant, do not drink alcohol. If you are breastfeeding, be very cautious about drinking alcohol. If you are not pregnant and choose to drink alcohol, do not have more than 1 drink per day. One drink is considered to be 12 ounces (355 mL) of beer, 5 ounces (148 mL) of wine, or 1.5 ounces (44 mL) of liquor.  Avoid use of street drugs. Do not share needles with anyone. Ask for help if you need support or instructions about stopping the use of drugs.  High blood pressure causes heart disease and increases the risk of stroke. Your blood pressure should be checked at least every 1 to 2 years. Ongoing high blood pressure should be treated with medicines if weight loss and exercise do not work.  If you are 77-45 years old, ask your health care provider if you should take aspirin to prevent strokes.  Diabetes screening involves taking a blood sample to check your fasting blood sugar level. This should be done once every 3 years, after age 23, if you are within normal weight and without risk factors for diabetes. Testing should be considered at a younger age or be carried out more frequently if you are overweight and have at least 1 risk factor for diabetes.  Breast cancer screening is essential preventive care for women. You should practice "breast self-awareness." This means understanding the normal appearance and feel of your breasts and may include breast self-examination. Any changes detected, no matter how small, should be reported to a health care provider. Women in their 45s and 30s should have a clinical breast exam (CBE) by a health care provider as part of a regular health exam every 1 to 3 years. After age 69, women should have a CBE every year. Starting at age 40, women should consider having a mammogram (breast X-ray test) every year.  Women who have a family history of breast cancer should talk to their health care provider about genetic screening. Women at a high risk of breast cancer should talk to their health care providers about having an MRI and a mammogram every year.  Breast cancer gene (BRCA)-related cancer risk assessment is recommended for women who have family members with BRCA-related cancers. BRCA-related cancers include breast, ovarian, tubal, and peritoneal cancers. Having family members with these cancers may be associated with an increased risk for harmful changes (mutations) in the breast cancer genes BRCA1 and BRCA2. Results of the assessment will determine the need for genetic counseling and BRCA1 and BRCA2 testing.  Routine pelvic exams to screen for cancer are no longer recommended for nonpregnant women who are considered low risk for cancer of the pelvic organs (ovaries, uterus, and vagina) and who do not  have symptoms. Ask your health care provider if a screening pelvic exam is right for you.  If you have had past treatment for cervical cancer or a condition that could lead to cancer, you need Pap tests and screening for cancer for at least 20 years after your treatment. If Pap tests have been discontinued, your risk factors (such as having a new sexual partner) need to be reassessed to determine if screening should be resumed. Some women have medical problems that increase the chance of getting cervical cancer. In these cases, your health care provider may recommend more frequent screening and Pap tests.  The HPV test is an additional test that may be used for cervical cancer screening. The HPV test looks for the virus that can cause the cell changes on the cervix. The cells collected during the Pap test can be tested for HPV. The HPV test could be used to screen women aged 31 years and older, and should be used in women of any age who have unclear Pap test results. After the age of 3, women should have HPV  testing at the same frequency as a Pap test.  Colorectal cancer can be detected and often prevented. Most routine colorectal cancer screening begins at the age of 73 years and continues through age 56 years. However, your health care provider may recommend screening at an earlier age if you have risk factors for colon cancer. On a yearly basis, your health care provider may provide home test kits to check for hidden blood in the stool. Use of a small camera at the end of a tube, to directly examine the colon (sigmoidoscopy or colonoscopy), can detect the earliest forms of colorectal cancer. Talk to your health care provider about this at age 24, when routine screening begins. Direct exam of the colon should be repeated every 5-10 years through age 85 years, unless early forms of pre-cancerous polyps or small growths are found.  People who are at an increased risk for hepatitis B should be screened for this virus. You are considered at high risk for hepatitis B if:  You were born in a country where hepatitis B occurs often. Talk with your health care provider about which countries are considered high risk.  Your parents were born in a high-risk country and you have not received a shot to protect against hepatitis B (hepatitis B vaccine).  You have HIV or AIDS.  You use needles to inject street drugs.  You live with, or have sex with, someone who has hepatitis B.  You get hemodialysis treatment.  You take certain medicines for conditions like cancer, organ transplantation, and autoimmune conditions.  Hepatitis C blood testing is recommended for all people born from [birth year] through 1965 and any individual with known risks for hepatitis C.  Practice safe sex. Use condoms and avoid high-risk sexual practices to reduce the spread of sexually transmitted infections (STIs). STIs include gonorrhea, chlamydia, syphilis, trichomonas, herpes, HPV, and human immunodeficiency virus (HIV). Herpes, HIV, and HPV  are viral illnesses that have no cure. They can result in disability, cancer, and death.  You should be screened for sexually transmitted illnesses (STIs) including gonorrhea and chlamydia if:  You are sexually active and are younger than 24 years.  You are older than 24 years and your health care provider tells you that you are at risk for this type of infection.  Your sexual activity has changed since you were last screened and you are at an increased risk  for chlamydia or gonorrhea. Ask your health care provider if you are at risk.  If you are at risk of being infected with HIV, it is recommended that you take a prescription medicine daily to prevent HIV infection. This is called preexposure prophylaxis (PrEP). You are considered at risk if:  You are a heterosexual woman, are sexually active, and are at increased risk for HIV infection.  You take drugs by injection.  You are sexually active with a partner who has HIV.  Talk with your health care provider about whether you are at high risk of being infected with HIV. If you choose to begin PrEP, you should first be tested for HIV. You should then be tested every 3 months for as long as you are taking PrEP.  Osteoporosis is a disease in which the bones lose minerals and strength with aging. This can result in serious bone fractures or breaks. The risk of osteoporosis can be identified using a bone density scan. Women ages 44 years and over and women at risk for fractures or osteoporosis should discuss screening with their health care providers. Ask your health care provider whether you should take a calcium supplement or vitamin D to reduce the rate of osteoporosis.  Menopause can be associated with physical symptoms and risks. Hormone replacement therapy is available to decrease symptoms and risks. You should talk to your health care provider about whether hormone replacement therapy is right for you.  Use sunscreen. Apply sunscreen  liberally and repeatedly throughout the day. You should seek shade when your shadow is shorter than you. Protect yourself by wearing long sleeves, pants, a wide-brimmed hat, and sunglasses year round, whenever you are outdoors.  Once a month, do a whole body skin exam, using a mirror to look at the skin on your back. Tell your health care provider of new moles, moles that have irregular borders, moles that are larger than a pencil eraser, or moles that have changed in shape or color.  Stay current with required vaccines (immunizations).  Influenza vaccine. All adults should be immunized every year.  Tetanus, diphtheria, and acellular pertussis (Td, Tdap) vaccine. Pregnant women should receive 1 dose of Tdap vaccine during each pregnancy. The dose should be obtained regardless of the length of time since the last dose. Immunization is preferred during the 27th-36th week of gestation. An adult who has not previously received Tdap or who does not know her vaccine status should receive 1 dose of Tdap. This initial dose should be followed by tetanus and diphtheria toxoids (Td) booster doses every 10 years. Adults with an unknown or incomplete history of completing a 3-dose immunization series with Td-containing vaccines should begin or complete a primary immunization series including a Tdap dose. Adults should receive a Td booster every 10 years.  Varicella vaccine. An adult without evidence of immunity to varicella should receive 2 doses or a second dose if she has previously received 1 dose. Pregnant females who do not have evidence of immunity should receive the first dose after pregnancy. This first dose should be obtained before leaving the health care facility. The second dose should be obtained 4-8 weeks after the first dose.  Human papillomavirus (HPV) vaccine. Females aged 13-26 years who have not received the vaccine previously should obtain the 3-dose series. The vaccine is not recommended for use  in pregnant females. However, pregnancy testing is not needed before receiving a dose. If a female is found to be pregnant after receiving a dose, no  treatment is needed. In that case, the remaining doses should be delayed until after the pregnancy. Immunization is recommended for any person with an immunocompromised condition through the age of 41 years if she did not get any or all doses earlier. During the 3-dose series, the second dose should be obtained 4-8 weeks after the first dose. The third dose should be obtained 24 weeks after the first dose and 16 weeks after the second dose.  Zoster vaccine. One dose is recommended for adults aged 74 years or older unless certain conditions are present.  Measles, mumps, and rubella (MMR) vaccine. Adults born before 75 generally are considered immune to measles and mumps. Adults born in [birth year] or later should have 1 or more doses of MMR vaccine unless there is a contraindication to the vaccine or there is laboratory evidence of immunity to each of the three diseases. A routine second dose of MMR vaccine should be obtained at least 28 days after the first dose for students attending postsecondary schools, health care workers, or international travelers. People who received inactivated measles vaccine or an unknown type of measles vaccine during 1963-1967 should receive 2 doses of MMR vaccine. People who received inactivated mumps vaccine or an unknown type of mumps vaccine before 1979 and are at high risk for mumps infection should consider immunization with 2 doses of MMR vaccine. For females of childbearing age, rubella immunity should be determined. If there is no evidence of immunity, females who are not pregnant should be vaccinated. If there is no evidence of immunity, females who are pregnant should delay immunization until after pregnancy. Unvaccinated health care workers born before [birth year] who lack laboratory evidence of measles, mumps, or rubella immunity or  laboratory confirmation of disease should consider measles and mumps immunization with 2 doses of MMR vaccine or rubella immunization with 1 dose of MMR vaccine.  Pneumococcal 13-valent conjugate (PCV13) vaccine. When indicated, a person who is uncertain of her immunization history and has no record of immunization should receive the PCV13 vaccine. An adult aged 68 years or older who has certain medical conditions and has not been previously immunized should receive 1 dose of PCV13 vaccine. This PCV13 should be followed with a dose of pneumococcal polysaccharide (PPSV23) vaccine. The PPSV23 vaccine dose should be obtained at least 8 weeks after the dose of PCV13 vaccine. An adult aged 39 years or older who has certain medical conditions and previously received 1 or more doses of PPSV23 vaccine should receive 1 dose of PCV13. The PCV13 vaccine dose should be obtained 1 or more years after the last PPSV23 vaccine dose.  Pneumococcal polysaccharide (PPSV23) vaccine. When PCV13 is also indicated, PCV13 should be obtained first. All adults aged 21 years and older should be immunized. An adult younger than age 31 years who has certain medical conditions should be immunized. Any person who resides in a nursing home or long-term care facility should be immunized. An adult smoker should be immunized. People with an immunocompromised condition and certain other conditions should receive both PCV13 and PPSV23 vaccines. People with human immunodeficiency virus (HIV) infection should be immunized as soon as possible after diagnosis. Immunization during chemotherapy or radiation therapy should be avoided. Routine use of PPSV23 vaccine is not recommended for American Indians, [place] Natives, or people younger than 65 years unless there are medical conditions that require PPSV23 vaccine. When indicated, people who have unknown immunization and have no record of immunization should receive PPSV23 vaccine. One-time revaccination  5 years  after the first dose of PPSV23 is recommended for people aged 19-64 years who have chronic kidney failure, nephrotic syndrome, asplenia, or immunocompromised conditions. People who received 1-2 doses of PPSV23 before age 16 years should receive another dose of PPSV23 vaccine at age 83 years or later if at least 5 years have passed since the previous dose. Doses of PPSV23 are not needed for people immunized with PPSV23 at or after age 44 years.  Meningococcal vaccine. Adults with asplenia or persistent complement component deficiencies should receive 2 doses of quadrivalent meningococcal conjugate (MenACWY-D) vaccine. The doses should be obtained at least 2 months apart. Microbiologists working with certain meningococcal bacteria, [employer] recruits, people at risk during an outbreak, and people who travel to or live in countries with a high rate of meningitis should be immunized. A first-year college student up through age 72 years who is living in a residence hall should receive a dose if she did not receive a dose on or after her 16th birthday. Adults who have certain high-risk conditions should receive one or more doses of vaccine.  Hepatitis A vaccine. Adults who wish to be protected from this disease, have certain high-risk conditions, work with hepatitis A-infected animals, work in hepatitis A research labs, or travel to or work in countries with a high rate of hepatitis A should be immunized. Adults who were previously unvaccinated and who anticipate close contact with an international adoptee during the first 60 days after arrival in the [place] from a country with a high rate of hepatitis A should be immunized.  Hepatitis B vaccine. Adults who wish to be protected from this disease, have certain high-risk conditions, may be exposed to blood or other infectious body fluids, are household contacts or sex partners of hepatitis B positive people, are clients or workers in certain care  facilities, or travel to or work in countries with a high rate of hepatitis B should be immunized.  Haemophilus influenzae type b (Hib) vaccine. A previously unvaccinated person with asplenia or sickle cell disease or having a scheduled splenectomy should receive 1 dose of Hib vaccine. Regardless of previous immunization, a recipient of a hematopoietic stem cell transplant should receive a 3-dose series 6-12 months after her successful transplant. Hib vaccine is not recommended for adults with HIV infection. Preventive Services / Frequency Ages 13 years and over  Blood pressure check.** / Every 1 to 2 years.  Lipid and cholesterol check.** / Every 5 years beginning at age 109 years.  Lung cancer screening. / Every year if you are aged 57-80 years and have a 30-pack-year history of smoking and currently smoke or have quit within the past 15 years. Yearly screening is stopped once you have quit smoking for at least 15 years or develop a health problem that would prevent you from having lung cancer treatment.  Clinical breast exam.** / Every year after age 75 years.  BRCA-related cancer risk assessment.** / For women who have family members with a BRCA-related cancer (breast, ovarian, tubal, or peritoneal cancers).  Mammogram.** / Every year beginning at age 26 years and continuing for as long as you are in good health. Consult with your health care provider.  Pap test.** / Every 3 years starting at age 31 years through age 55 or 47 years with 3 consecutive normal Pap tests. Testing can be stopped between 65 and 70 years with 3 consecutive normal Pap tests and no abnormal Pap or HPV tests in the past 10 years.  HPV  screening.** / Every 3 years from ages 63 years through ages 58 or 68 years with a history of 3 consecutive normal Pap tests. Testing can be stopped between 65 and 70 years with 3 consecutive normal Pap tests and no abnormal Pap or HPV tests in the past 10 years.  Fecal occult blood  test (FOBT) of stool. / Every year beginning at age 23 years and continuing until age 8 years. You may not need to do this test if you get a colonoscopy every 10 years.  Flexible sigmoidoscopy or colonoscopy.** / Every 5 years for a flexible sigmoidoscopy or every 10 years for a colonoscopy beginning at age 64 years and continuing until age 24 years.  Hepatitis C blood test.** / For all people born from [birth year] through 1965 and any individual with known risks for hepatitis C.  Osteoporosis screening.** / A one-time screening for women ages 64 years and over and women at risk for fractures or osteoporosis.  Skin self-exam. / Monthly.  Influenza vaccine. / Every year.  Tetanus, diphtheria, and acellular pertussis (Tdap/Td) vaccine.** / 1 dose of Td every 10 years.  Varicella vaccine.** / Consult your health care provider.  Zoster vaccine.** / 1 dose for adults aged 65 years or older.  Pneumococcal 13-valent conjugate (PCV13) vaccine.** / Consult your health care provider.  Pneumococcal polysaccharide (PPSV23) vaccine.** / 1 dose for all adults aged 22 years and older.  Meningococcal vaccine.** / Consult your health care provider.  Hepatitis A vaccine.** / Consult your health care provider.  Hepatitis B vaccine.** / Consult your health care provider.  Haemophilus influenzae type b (Hib) vaccine.** / Consult your health care provider. ** Family history and personal history of risk and conditions may change your health care provider's recommendations. Document Released: 05/19/2001 Document Revised: 08/07/2013 Document Reviewed: 08/18/2010 [facility] Patient Information 2015 [place], [place]. This information is not intended to replace advice given to you by your health care provider. Make sure you discuss any questions you have with your health care provider.

## 2014-02-12 ENCOUNTER — Other Ambulatory Visit: Payer: Self-pay

## 2014-02-12 MED ORDER — ARIPIPRAZOLE 2 MG PO TABS: ORAL_TABLET | ORAL | Status: DC

## 2014-02-12 NOTE — Telephone Encounter (Signed)
Last seen 11/28/13 [facility]

## 2014-02-23 LAB — HM DIABETES EYE EXAM

## 2014-04-04 ENCOUNTER — Other Ambulatory Visit: Payer: Self-pay

## 2014-04-04 MED ORDER — ONETOUCH DELICA LANCETS 33G MISC: Status: DC

## 2014-04-16 ENCOUNTER — Other Ambulatory Visit: Payer: Self-pay | Admitting: Family Medicine

## 2014-04-17 ENCOUNTER — Ambulatory Visit (INDEPENDENT_AMBULATORY_CARE_PROVIDER_SITE_OTHER): Payer: Medicare Other | Admitting: Family Medicine

## 2014-04-17 ENCOUNTER — Encounter: Payer: Self-pay | Admitting: Family Medicine

## 2014-04-17 VITALS — BP 133/81 | HR 89 | Temp 97.1°F | Ht 66.0 in | Wt 215.0 lb

## 2014-04-17 DIAGNOSIS — M5137 Other intervertebral disc degeneration, lumbosacral region: Secondary | ICD-10-CM

## 2014-04-17 DIAGNOSIS — E785 Hyperlipidemia, unspecified: Secondary | ICD-10-CM

## 2014-04-17 DIAGNOSIS — F329 Major depressive disorder, single episode, unspecified: Secondary | ICD-10-CM

## 2014-04-17 DIAGNOSIS — M797 Fibromyalgia: Secondary | ICD-10-CM

## 2014-04-17 DIAGNOSIS — E11610 Type 2 diabetes mellitus with diabetic neuropathic arthropathy: Secondary | ICD-10-CM

## 2014-04-17 DIAGNOSIS — F32A Depression, unspecified: Secondary | ICD-10-CM

## 2014-04-17 DIAGNOSIS — K219 Gastro-esophageal reflux disease without esophagitis: Secondary | ICD-10-CM

## 2014-04-17 DIAGNOSIS — E559 Vitamin D deficiency, unspecified: Secondary | ICD-10-CM

## 2014-04-17 DIAGNOSIS — E119 Type 2 diabetes mellitus without complications: Secondary | ICD-10-CM

## 2014-04-17 DIAGNOSIS — I10 Essential (primary) hypertension: Secondary | ICD-10-CM

## 2014-04-17 LAB — POCT CBC
GRANULOCYTE PERCENT: 60.8 % (ref 37–80)
HCT, POC: 42.3 % (ref 37.7–47.9)
Hemoglobin: 13.1 g/dL (ref 12.2–16.2)
LYMPH, POC: 2.5 (ref 0.6–3.4)
MCH: 26.4 pg — AB (ref 27–31.2)
MCHC: 30.9 g/dL — AB (ref 31.8–35.4)
MCV: 85.3 fL (ref 80–97)
MPV: 8.4 fL (ref 0–99.8)
POC Granulocyte: 4.4 (ref 2–6.9)
POC LYMPH PERCENT: 35.1 %L (ref 10–50)
Platelet Count, POC: 237 10*3/uL (ref 142–424)
RBC: 5 M/uL (ref 4.04–5.48)
RDW, POC: 14.7 %
WBC: 7.2 10*3/uL (ref 4.6–10.2)

## 2014-04-17 LAB — POCT GLYCOSYLATED HEMOGLOBIN (HGB A1C): HEMOGLOBIN A1C: 7

## 2014-04-17 MED ORDER — GABAPENTIN 600 MG PO TABS: 600.0000 mg | ORAL_TABLET | Freq: Four times a day (QID) | ORAL | Status: DC

## 2014-04-17 MED ORDER — SITAGLIPTIN PHOS-METFORMIN HCL 50-1000 MG PO TABS: 1.0000 | ORAL_TABLET | Freq: Two times a day (BID) | ORAL | Status: DC

## 2014-04-17 NOTE — Addendum Note (Signed)
Addended by: Zannie Cove on: 04/17/2014 08:57 AM   Modules accepted: Orders

## 2014-04-17 NOTE — Patient Instructions (Addendum)
Medicare Annual Wellness Visit  [facility] and the medical providers at [facility] strive to bring you the best medical care.  In doing so we not only want to address your current medical conditions and concerns but also to detect new conditions early and prevent illness, disease and health-related problems.    Medicare offers a yearly Wellness Visit which allows our clinical staff to assess your need for preventative services including immunizations, lifestyle education, counseling to decrease risk of preventable diseases and screening for fall risk and other medical concerns.    This visit is provided free of charge (no copay) for all Medicare recipients. The clinical pharmacists at [facility] have begun to conduct these Wellness Visits which will also include a thorough review of all your medications.    As you primary medical provider recommend that you make an appointment for your Annual Wellness Visit if you have not done so already this year.  You may set up this appointment before you leave today or you may call back [phone]) and schedule an appointment.  Please make sure when you call that you mention that you are scheduling your Annual Wellness Visit with the clinical pharmacist so that the appointment may be made for the proper length of time.     Continue current medications. Continue good therapeutic lifestyle changes which include good diet and exercise. Fall precautions discussed with patient. If an FOBT was given today- please return it to our front desk. If you are over 78 years old - you may need Prevnar 57 or the adult Pneumonia vaccine.  Flu Shots will be available at our office starting mid- September. Please call and schedule a FLU CLINIC APPOINTMENT.   Continue to monitor blood sugars regularly Continue current weight loss regimen with as much exercise as possible and with current diet habits as they  are working. Drink plenty of water Continue current treatment Follow-up regularly with the orthopedic surgeon regarding your back Increase the gabapentin as directed

## 2014-04-17 NOTE — Telephone Encounter (Signed)
Patient seen today [facility]

## 2014-04-17 NOTE — Progress Notes (Signed)
Subjective:    [name], female    DOB: 1946-03-06, 69 y.o.   MRN: [id number]  HPI Pt here for follow up and management of chronic medical problems which include diabetes, hypertension, and hyperlipidemia. The patient continues to take her medication for these problems and takes this medication regularly. She is due to get lab work today and will be given an FOBT to return. Her blood pressure has been under good control. Her weight is actually down 8 pounds since the last visit and this is good. She is requesting a refill on the Janumet. The patient brings in blood sugar readings for the past 3-1/2 months and these were good overall especially with fasting blood sugars running in the 110 to 1:30 range. These will be scanned into the record. The patient also has fibromyalgia in combination with degenerative disc disease in her lumbar spine and diabetes. She indicates that the neuropathy in her feet is worse in the evening when she sits down. She is only taking gabapentin 600 mg 3 times daily. She is in good spirits today and is alert.        Patient Active Problem List   Diagnosis Date Noted  . Depression 02/28/2013  . GERD (gastroesophageal reflux disease) 02/28/2013  . Hiatal hernia with gastroesophageal reflux 02/28/2013  . Peripheral neuropathy 02/28/2013  . DDD (degenerative disc disease), lumbosacral 06/23/2012  . Fibromyalgia syndrome 06/23/2012  . Essential hypertension, benign 06/23/2012  . Osteopenia 06/23/2012  . Type II or unspecified type diabetes mellitus without mention of complication, not stated as uncontrolled 06/23/2012  . Abnormal EKG 10/29/2010  . Obesity due to excess calories 10/29/2010  . 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 DYSPHAGIA 10/11/2009  . PERSONAL HISTORY OF COLONIC POLYPS 10/11/2009   Outpatient Encounter Prescriptions as of 04/17/2014  Medication Sig  . ALPRAZolam (XANAX) 0.5 MG tablet Take 1 tablet (0.5 mg total) by  mouth at bedtime as needed.  . ARIPiprazole (ABILIFY) 2 MG tablet One tablet qd  . Biotin 5000 MCG CAPS Take 5,000 mcg by mouth daily.   . Blood Glucose Monitoring Suppl DEVI Blood glucose meter One Touch Brand. Use to check BS up to BID. DX 250.02  . cholecalciferol (VITAMIN D) 1000 UNITS tablet Take 1,000 Units by mouth daily.   Marland Kitchen conjugated estrogens (PREMARIN) vaginal cream Place 1 Applicatorful vaginally at bedtime as needed. Use q hs  . escitalopram (LEXAPRO) 20 MG tablet Take 1 tablet (20 mg total) by mouth daily.  Marland Kitchen esomeprazole (NEXIUM) 40 MG capsule Take 1 capsule (40 mg total) by mouth daily at 12 noon.  . ezetimibe (ZETIA) 10 MG tablet Take 1 tablet (10 mg total) by mouth daily.  Marland Kitchen gabapentin (NEURONTIN) 600 MG tablet Take 1 tablet (600 mg total) by mouth 3 (three) times daily.  . Glucose Blood (BLOOD GLUCOSE TEST STRIPS) STRP Please dispense test strips that go with glucometer that patient's insurance will cover. Use to check BG twice daily.  DX:  250.02  . glucose monitoring kit (FREESTYLE) monitoring kit Please dispense whichever glucometer patient's insurance will cover.  Use to check BG up to twice.  DX: 250.02  . HYDROcodone-acetaminophen (NORCO) 7.5-325 MG per tablet   . Lancets 30G MISC Use to check BG twice daily.  Dx:  250.02  . lisinopril-hydrochlorothiazide (PRINZIDE,ZESTORETIC) 20-25 MG per tablet Take 1 tablet by mouth daily.  . Omega-3 Fatty Acids (FISH OIL) 1000 MG CAPS Take by mouth 2 (two) times daily.    Marland Kitchen  ONETOUCH DELICA LANCETS 56E MISC Please check BS up to BID.  Marland Kitchen Pitavastatin Calcium (LIVALO) 2 MG TABS Take 1 tablet (2 mg total) by mouth daily.  . sitaGLIPtin-metformin (JANUMET) 50-1000 MG per tablet Take 1 tablet by mouth 2 (two) times daily with a meal.  . sitaGLIPtin-metformin (JANUMET) 50-1000 MG per tablet Take 1 tablet by mouth 2 (two) times daily with a meal.  . [DISCONTINUED] omeprazole (PRILOSEC) 40 MG capsule Take 1 capsule (40 mg total) by mouth  daily.  . [DISCONTINUED] JANUMET 50-500 MG per tablet Take 1 tablet by mouth.     Review of Systems  Constitutional: Negative.   HENT: Negative.   Eyes: Negative.   Respiratory: Negative.   Cardiovascular: Negative.   Gastrointestinal: Negative.   Endocrine: Negative.   Genitourinary: Negative.   Musculoskeletal: Negative.   Skin: Negative.   Allergic/Immunologic: Negative.   Neurological: Negative.        Neuropathy in feet  Hematological: Negative.   Psychiatric/Behavioral: Negative.        Objective:   Physical Exam  Constitutional: She is oriented to person, place, and time. She appears well-developed and well-nourished. No distress.  The patient is pleasant and in good spirits today.  HENT:  Head: Normocephalic and atraumatic.  Right Ear: External ear normal.  Left Ear: External ear normal.  Nose: Nose normal.  Mouth/Throat: Oropharynx is clear and moist.  Eyes: Conjunctivae and EOM are normal. Pupils are equal, round, and reactive to light. Right eye exhibits no discharge. Left eye exhibits no discharge. No scleral icterus.  Neck: Normal range of motion. Neck supple. No thyromegaly present.  There are no carotid bruits or anterior cervical or posterior cervical adenopathy.  Cardiovascular: Normal rate, regular rhythm, normal heart sounds and intact distal pulses.  Exam reveals no gallop and no friction rub.   No murmur heard. The rhythm is regular at 72/m  Pulmonary/Chest: Effort normal and breath sounds normal. No respiratory distress. She has no wheezes. She has no rales. She exhibits no tenderness.  Abdominal: Soft. Bowel sounds are normal. She exhibits no mass. There is no tenderness. There is no rebound and no guarding.  The abdomen is obese with normal bowel sounds and no abdominal bruits  Musculoskeletal: Normal range of motion. She exhibits no edema or tenderness.  She has had both knees replaced and her range of motion is good with some slowness of getting on  the table and getting down from the table because of her back.  Lymphadenopathy:    She has no cervical adenopathy.  Neurological: She is alert and oriented to person, place, and time. She has normal reflexes. No cranial nerve deficit.  Skin: Skin is warm and dry. No rash noted.  Psychiatric: She has a normal mood and affect. Her behavior is normal. Judgment and thought content normal.  Nursing note and vitals reviewed.   BP 133/81 mmHg  Pulse 89  Temp(Src) 97.1 F (36.2 C) (Oral)  Ht 5' 6"  (1.676 m)  Wt 215 lb (97.523 kg)  BMI 34.72 kg/m2       Assessment & Plan:   1. Vitamin D deficiency -Continue vitamin D and any adjustment will be made when the lab work is returned - POCT CBC - Vit D  25 hydroxy (rtn osteoporosis monitoring)  2. Gastroesophageal reflux disease, esophagitis presence not specified -She is doing well with this. Continue Nexium as doing and watching diet - POCT CBC - Hepatic function panel  3. Essential hypertension, benign -The blood  pressure is good today. Continue with sodium restriction and current medication - POCT CBC - BMP8+EGFR - Hepatic function panel  4. Hyperlipidemia -Any changes in her lipid management with taking Livalo 2 mg 3 days weekly will be addressed when lab work is returned - POCT CBC - NMR, lipoprofile  5. Benign essential HTN -Continue lisinopril HCT as doing - POCT CBC  6. Type 2 diabetes mellitus without complication -Continue Janumet twice daily and omega-3 fatty acids. - POCT CBC - POCT glycosylated hemoglobin (Hb A1C)  7. DDD (degenerative disc disease), lumbosacral -Continue with walking exercise as much as possible - POCT CBC  8. Depression -Continue with Abilify, Xanax and Lexapro - POCT CBC  9. Diabetic neurogenic arthropathy -Increase gabapentin 600 mg to 4 Times daily - POCT CBC - POCT glycosylated hemoglobin (Hb A1C)  10. Fibromyalgia syndrome -Continue with walking exercise as much as possible and  current medication for anxiety and stress   Meds ordered this encounter  Medications  . HYDROcodone-acetaminophen (NORCO) 7.5-325 MG per tablet    Sig:     Refill:  0  . DISCONTD: JANUMET 50-500 MG per tablet    Sig: Take 1 tablet by mouth.     Refill:  1  . sitaGLIPtin-metformin (JANUMET) 50-1000 MG per tablet    Sig: Take 1 tablet by mouth 2 (two) times daily with a meal.  . sitaGLIPtin-metformin (JANUMET) 50-1000 MG per tablet    Sig: Take 1 tablet by mouth 2 (two) times daily with a meal.    Dispense:  180 tablet    Refill:  3  . gabapentin (NEURONTIN) 600 MG tablet    Sig: Take 1 tablet (600 mg total) by mouth 4 (four) times daily.    Dispense:  270 tablet    Refill:  1     Patient Instructions                       Medicare Annual Wellness Visit  [facility] and the medical providers at [facility] strive to bring you the best medical care.  In doing so we not only want to address your current medical conditions and concerns but also to detect new conditions early and prevent illness, disease and health-related problems.    Medicare offers a yearly Wellness Visit which allows our clinical staff to assess your need for preventative services including immunizations, lifestyle education, counseling to decrease risk of preventable diseases and screening for fall risk and other medical concerns.    This visit is provided free of charge (no copay) for all Medicare recipients. The clinical pharmacists at [facility] have begun to conduct these Wellness Visits which will also include a thorough review of all your medications.    As you primary medical provider recommend that you make an appointment for your Annual Wellness Visit if you have not done so already this year.  You may set up this appointment before you leave today or you may call back ([phone]) and schedule an appointment.  Please make sure when you call that you mention that  you are scheduling your Annual Wellness Visit with the clinical pharmacist so that the appointment may be made for the proper length of time.     Continue current medications. Continue good therapeutic lifestyle changes which include good diet and exercise. Fall precautions discussed with patient. If an FOBT was given today- please return it to our front desk. If you are over 50  years old - you may need Prevnar 83 or the adult Pneumonia vaccine.  Flu Shots will be available at our office starting mid- September. Please call and schedule a FLU CLINIC APPOINTMENT.   Continue to monitor blood sugars regularly Continue current weight loss regimen with as much exercise as possible and with current diet habits as they are working. Drink plenty of water Continue current treatment Follow-up regularly with the orthopedic surgeon regarding your back Increase the gabapentin as directed   Arrie Senate MD

## 2014-04-18 LAB — NMR, LIPOPROFILE
Cholesterol: 153 mg/dL (ref 100–199)
HDL Cholesterol by NMR: 47 mg/dL (ref >39)
HDL Particle Number: 35.4 umol/L (ref >=30.5)
LDL PARTICLE NUMBER: 1122 nmol/L — AB (ref <1000)
LDL Size: 20.4 nm (ref >20.5)
LDL-C: 71 mg/dL (ref 0–99)
LP-IR Score: 62 — ABNORMAL HIGH (ref <=45)
Small LDL Particle Number: 561 nmol/L — ABNORMAL HIGH (ref <=527)
Triglycerides by NMR: 177 mg/dL — ABNORMAL HIGH (ref 0–149)

## 2014-04-18 LAB — BMP8+EGFR
BUN/Creatinine Ratio: 19 (ref 11–26)
BUN: 15 mg/dL (ref 8–27)
CALCIUM: 9.6 mg/dL (ref 8.7–10.3)
CO2: 25 mmol/L (ref 18–29)
Chloride: 95 mmol/L — ABNORMAL LOW (ref 97–108)
Creatinine, Ser: 0.79 mg/dL (ref 0.57–1.00)
GFR calc Af Amer: 89 mL/min/{1.73_m2} (ref >59)
GFR, EST NON AFRICAN AMERICAN: 77 mL/min/{1.73_m2} (ref >59)
GLUCOSE: 112 mg/dL — AB (ref 65–99)
POTASSIUM: 4.9 mmol/L (ref 3.5–5.2)
Sodium: 137 mmol/L (ref 134–144)

## 2014-04-18 LAB — HEPATIC FUNCTION PANEL
ALK PHOS: 76 IU/L (ref 39–117)
ALT: 44 IU/L — AB (ref 0–32)
AST: 47 IU/L — ABNORMAL HIGH (ref 0–40)
Albumin: 4.2 g/dL (ref 3.6–4.8)
BILIRUBIN DIRECT: 0.11 mg/dL (ref 0.00–0.40)
Total Bilirubin: 0.3 mg/dL (ref 0.0–1.2)
Total Protein: 6.9 g/dL (ref 6.0–8.5)

## 2014-04-18 LAB — VITAMIN D 25 HYDROXY (VIT D DEFICIENCY, FRACTURES): VIT D 25 HYDROXY: 36.8 ng/mL (ref 30.0–100.0)

## 2014-07-02 ENCOUNTER — Other Ambulatory Visit: Payer: Self-pay | Admitting: Dermatology

## 2014-07-02 ENCOUNTER — Other Ambulatory Visit: Payer: Self-pay | Admitting: Family Medicine

## 2014-07-09 ENCOUNTER — Telehealth: Payer: Self-pay | Admitting: Family Medicine

## 2014-07-12 ENCOUNTER — Other Ambulatory Visit: Payer: Self-pay | Admitting: Family Medicine

## 2014-07-17 ENCOUNTER — Telehealth: Payer: Self-pay

## 2014-07-17 NOTE — Telephone Encounter (Signed)
[employer] approved Nexium until 07/10/15

## 2014-07-23 NOTE — Telephone Encounter (Signed)
Left message to call us if her nexium issue was not resolved.  Nexium is in generic now and can be purchased OTC.

## 2014-08-26 ENCOUNTER — Other Ambulatory Visit: Payer: Self-pay | Admitting: Family Medicine

## 2014-08-30 ENCOUNTER — Ambulatory Visit (INDEPENDENT_AMBULATORY_CARE_PROVIDER_SITE_OTHER): Payer: Medicare Other | Admitting: Family Medicine

## 2014-08-30 ENCOUNTER — Encounter: Payer: Self-pay | Admitting: Family Medicine

## 2014-08-30 VITALS — BP 122/68 | HR 72 | Temp 97.5°F | Ht 66.0 in | Wt 212.0 lb

## 2014-08-30 DIAGNOSIS — M5137 Other intervertebral disc degeneration, lumbosacral region: Secondary | ICD-10-CM | POA: Diagnosis not present

## 2014-08-30 DIAGNOSIS — E11610 Type 2 diabetes mellitus with diabetic neuropathic arthropathy: Secondary | ICD-10-CM

## 2014-08-30 DIAGNOSIS — E785 Hyperlipidemia, unspecified: Secondary | ICD-10-CM | POA: Diagnosis not present

## 2014-08-30 DIAGNOSIS — I10 Essential (primary) hypertension: Secondary | ICD-10-CM

## 2014-08-30 DIAGNOSIS — Z139 Encounter for screening, unspecified: Secondary | ICD-10-CM | POA: Diagnosis not present

## 2014-08-30 DIAGNOSIS — K219 Gastro-esophageal reflux disease without esophagitis: Secondary | ICD-10-CM

## 2014-08-30 DIAGNOSIS — M797 Fibromyalgia: Secondary | ICD-10-CM

## 2014-08-30 DIAGNOSIS — E119 Type 2 diabetes mellitus without complications: Secondary | ICD-10-CM

## 2014-08-30 DIAGNOSIS — E559 Vitamin D deficiency, unspecified: Secondary | ICD-10-CM | POA: Diagnosis not present

## 2014-08-30 LAB — POCT CBC
Granulocyte percent: 59 %G (ref 37–80)
HCT, POC: 39.8 % (ref 37.7–47.9)
HEMOGLOBIN: 12.5 g/dL (ref 12.2–16.2)
Lymph, poc: 2.9 (ref 0.6–3.4)
MCH, POC: 27.6 pg (ref 27–31.2)
MCHC: 31.4 g/dL — AB (ref 31.8–35.4)
MCV: 88 fL (ref 80–97)
MPV: 8.8 fL (ref 0–99.8)
POC GRANULOCYTE: 4.5 (ref 2–6.9)
POC LYMPH PERCENT: 37.8 %L (ref 10–50)
Platelet Count, POC: 239 10*3/uL (ref 142–424)
RBC: 4.52 M/uL (ref 4.04–5.48)
RDW, POC: 14.2 %
WBC: 7.6 10*3/uL (ref 4.6–10.2)

## 2014-08-30 LAB — POCT GLYCOSYLATED HEMOGLOBIN (HGB A1C): HEMOGLOBIN A1C: 6.8

## 2014-08-30 LAB — POCT UA - MICROALBUMIN: MICROALBUMIN (UR) POC: NEGATIVE mg/L

## 2014-08-30 MED ORDER — ESOMEPRAZOLE MAGNESIUM 40 MG PO CPDR: DELAYED_RELEASE_CAPSULE | ORAL | Status: DC

## 2014-08-30 MED ORDER — PITAVASTATIN CALCIUM 2 MG PO TABS: 2.0000 mg | ORAL_TABLET | Freq: Every day | ORAL | Status: DC

## 2014-08-30 MED ORDER — LISINOPRIL-HYDROCHLOROTHIAZIDE 20-25 MG PO TABS: 1.0000 | ORAL_TABLET | Freq: Every day | ORAL | Status: DC

## 2014-08-30 MED ORDER — GABAPENTIN 600 MG PO TABS: 600.0000 mg | ORAL_TABLET | Freq: Four times a day (QID) | ORAL | Status: DC

## 2014-08-30 MED ORDER — ALPRAZOLAM 0.5 MG PO TABS: 0.5000 mg | ORAL_TABLET | Freq: Every evening | ORAL | Status: DC | PRN

## 2014-08-30 MED ORDER — SUVOREXANT 10 MG PO TABS: 1.0000 | ORAL_TABLET | Freq: Every evening | ORAL | Status: DC | PRN

## 2014-08-30 MED ORDER — SITAGLIPTIN PHOS-METFORMIN HCL 50-1000 MG PO TABS: 1.0000 | ORAL_TABLET | Freq: Two times a day (BID) | ORAL | Status: DC

## 2014-08-30 MED ORDER — EZETIMIBE 10 MG PO TABS: 10.0000 mg | ORAL_TABLET | Freq: Every day | ORAL | Status: DC

## 2014-08-30 NOTE — Progress Notes (Signed)
Subjective:    [name], female    DOB: March 24, 1946, 69 y.o.   MRN: [id number]  HPI Pt here for follow up and management of chronic medical problems which includes hypertension, hyperlipidemia, and diabetes. She is taking medications regularly. The patient has a history of multiple medical problems. She continues to have neuropathy in her legs. She also is having more problems with insomnia. She is due to have a pelvic and Pap smear and mammogram done and we'll schedule these. She is also due to return an FOBT and get lab work which will be done today. She is requesting refills on several of her medications. The patient comes in today and admits that she is unable to walk and exercise but very little because of her chronic back pain. She says she can even sweeping the floors at home or lean over the seat to wash the dishes without having pain. She does walk some and especially when she goes shopping she uses a cart to lean on to walk. She denies chest pain shortness of breath, has minimal trouble swallowing which is no worse than usual and denies blood in the stool or black tarry bowel movements. She is also passing her water without problems. She sees the orthopedic surgeon about every 4 months and does receive injections which helped for only a short period of time. He has offered surgery as an option but she is refrained from pursuing this because she was thought to be the last option that she pursues. She brings in home blood sugars for review and they will be scanned into the record.       Patient Active Problem List   Diagnosis Date Noted  . Depression 02/28/2013  . GERD (gastroesophageal reflux disease) 02/28/2013  . Hiatal hernia with gastroesophageal reflux 02/28/2013  . Peripheral neuropathy 02/28/2013  . DDD (degenerative disc disease), lumbosacral 06/23/2012  . Fibromyalgia syndrome 06/23/2012  . Essential hypertension, benign 06/23/2012  . Osteopenia 06/23/2012  .  Type II or unspecified type diabetes mellitus without mention of complication, not stated as uncontrolled 06/23/2012  . Abnormal EKG 10/29/2010  . Obesity due to excess calories 10/29/2010  . 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 DYSPHAGIA 10/11/2009  . PERSONAL HISTORY OF COLONIC POLYPS 10/11/2009   Outpatient Encounter Prescriptions as of 08/30/2014  Medication Sig  . ALPRAZolam (XANAX) 0.5 MG tablet Take 1 tablet (0.5 mg total) by mouth at bedtime as needed.  . ARIPiprazole (ABILIFY) 2 MG tablet TAKE 1 TABLET EVERY DAY  . Biotin 5000 MCG CAPS Take 5,000 mcg by mouth daily.   . Blood Glucose Monitoring Suppl DEVI Blood glucose meter One Touch Brand. Use to check BS up to BID. DX 250.02  . cholecalciferol (VITAMIN D) 1000 UNITS tablet Take 1,000 Units by mouth daily.   Marland Kitchen conjugated estrogens (PREMARIN) vaginal cream Place 1 Applicatorful vaginally at bedtime as needed. Use q hs  . escitalopram (LEXAPRO) 20 MG tablet Take 1 tablet (20 mg total) by mouth daily.  Marland Kitchen gabapentin (NEURONTIN) 600 MG tablet Take 1 tablet (600 mg total) by mouth 4 (four) times daily.  . Glucose Blood (BLOOD GLUCOSE TEST STRIPS) STRP Please dispense test strips that go with glucometer that patient's insurance will cover. Use to check BG twice daily.  DX:  250.02  . glucose monitoring kit (FREESTYLE) monitoring kit Please dispense whichever glucometer patient's insurance will cover.  Use to check BG up to twice.  DX: 250.02  . HYDROcodone-acetaminophen (NORCO) 7.5-325  MG per tablet   . Lancets 30G MISC Use to check BG twice daily.  Dx:  250.02  . lisinopril-hydrochlorothiazide (PRINZIDE,ZESTORETIC) 20-25 MG per tablet TAKE 1 TABLET EVERY DAY  . NEXIUM 40 MG capsule TAKE 1 CAPSULE (40 MG TOTAL) BY MOUTH DAILY AT 12 NOON.  . Omega-3 Fatty Acids (FISH OIL) 1000 MG CAPS Take by mouth 2 (two) times daily.    [name] DELICA LANCETS 72C MISC Please check BS up to BID.  Marland Kitchen Pitavastatin Calcium (LIVALO) 2 MG TABS  Take 1 tablet (2 mg total) by mouth daily.  . sitaGLIPtin-metformin (JANUMET) 50-1000 MG per tablet Take 1 tablet by mouth 2 (two) times daily with a meal.  . ZETIA 10 MG tablet TAKE 1 TABLET EVERY DAY  . [DISCONTINUED] sitaGLIPtin-metformin (JANUMET) 50-1000 MG per tablet Take 1 tablet by mouth 2 (two) times daily with a meal.   No facility-administered encounter medications on file as of 08/30/2014.      Review of Systems  Constitutional: Negative.   HENT: Negative.   Eyes: Negative.   Respiratory: Negative.   Cardiovascular: Negative.   Gastrointestinal: Negative.   Endocrine: Negative.   Genitourinary: Negative.   Musculoskeletal: Negative.        Neuropathy  Skin: Negative.   Allergic/Immunologic: Negative.   Neurological: Negative.   Hematological: Negative.   Psychiatric/Behavioral: Positive for sleep disturbance.       Objective:   Physical Exam  Constitutional: She is oriented to person, place, and time. She appears well-developed and well-nourished. No distress.  The patient is pleasant and alert  HENT:  Head: Normocephalic and atraumatic.  Right Ear: External ear normal.  Left Ear: External ear normal.  Nose: Nose normal.  Mouth/Throat: Oropharynx is clear and moist.  Eyes: Conjunctivae and EOM are normal. Pupils are equal, round, and reactive to light. Right eye exhibits no discharge. Left eye exhibits no discharge. No scleral icterus.  Neck: Normal range of motion. Neck supple. No thyromegaly present.  No carotid bruits thyromegaly or anterior cervical adenopathy  Cardiovascular: Normal rate, regular rhythm, normal heart sounds and intact distal pulses.   No murmur heard. At 60/m  Pulmonary/Chest: Effort normal and breath sounds normal. No respiratory distress. She has no wheezes. She has no rales. She exhibits no tenderness.  Clear anteriorly and posteriorly  Abdominal: Soft. Bowel sounds are normal. She exhibits no mass. There is no tenderness. There is no  rebound and no guarding.  Mild obesity without bruits or organ enlargement or adenopathy  Musculoskeletal: Normal range of motion. She exhibits no edema or tenderness.  Hesitant range of motion due to back pain but patient able to get on the table and off the table with minimal assistance  Lymphadenopathy:    She has no cervical adenopathy.  Neurological: She is alert and oriented to person, place, and time. She has normal reflexes. No cranial nerve deficit.  Skin: Skin is warm and dry. No rash noted. No erythema. No pallor.  Psychiatric: She has a normal mood and affect. Her behavior is normal. Judgment and thought content normal.  Nursing note and vitals reviewed.  BP 122/68 mmHg  Pulse 72  Temp(Src) 97.5 F (36.4 C) (Oral)  Ht _0  (1.676 m)  Wt 212 lb (96.163 kg)  BMI 34.23 kg/m2  Results for orders placed or performed in visit on 08/30/14  POCT CBC  Result Value Ref Range   WBC 7.6 4.6 - 10.2 K/uL   Lymph, poc 2.9 0.6 - 3.4  POC LYMPH PERCENT 37.8 10 - 50 %L   MID (cbc)  0 - 0.9   POC MID %  0 - 12 %M   POC Granulocyte 4.5 2 - 6.9   Granulocyte percent 59.0 37 - 80 %G   RBC 4.52 4.04 - 5.48 M/uL   Hemoglobin 12.5 12.2 - 16.2 g/dL   HCT, POC 39.8 37.7 - 47.9 %   MCV 88.0 80 - 97 fL   MCH, POC 27.6 27 - 31.2 pg   MCHC 31.4 (A) 31.8 - 35.4 g/dL   RDW, POC 14.2 %   Platelet Count, POC 239 142 - 424 K/uL   MPV 8.8 0 - 99.8 fL  POCT glycosylated hemoglobin (Hb A1C)  Result Value Ref Range   Hemoglobin A1C 6.8   POCT UA - Microalbumin  Result Value Ref Range   Microalbumin Ur, POC neg mg/L         Assessment & Plan:  1. Vitamin D deficiency -The patient should continue current treatment pending results of lab work - POCT CBC - Vit D  25 hydroxy (rtn osteoporosis monitoring)  2. Essential hypertension, benign -Her blood pressure is good today and she should continue with her ACE inhibitor/diuretic - POCT CBC - BMP8+EGFR - Hepatic function panel  3.  Hyperlipidemia -She should continue with her current cholesterol treatment coupled with diet and exercise as much as possible - POCT CBC - NMR, lipoprofile  4. Gastroesophageal reflux disease, esophagitis presence not specified -This is stable currently and there is no need for further evaluation - POCT CBC  5. Type 2 diabetes mellitus without complication -Blood sugars were brought in for review and they range from as low as 110 21 reading that was up to 205. They. Be averaging in the 120s to 130s and these are fasting blood sugars. - POCT CBC - POCT glycosylated hemoglobin (Hb A1C) - POCT UA - Microalbumin  6. DDD (degenerative disc disease), lumbosacral -She continues to follow-up with the orthopedic surgeon as well as taking gabapentin.  7. Diabetic neurogenic arthropathy -She will continue with her current diabetes treatment pending results of lab work  8. Fibromyalgia syndrome -She takes gabapentin for this and we may consider increasing the dose of this and she thinks this does help to some extent. She is currently taking 600 mg 3 times daily.  9. Gastroesophageal reflux disease without esophagitis -This is stable currently.  Meds ordered this encounter  Medications  . esomeprazole (NEXIUM) 40 MG capsule    Sig: TAKE 1 CAPSULE (40 MG TOTAL) BY MOUTH DAILY AT 12 NOON.    Dispense:  90 capsule    Refill:  3  . lisinopril-hydrochlorothiazide (PRINZIDE,ZESTORETIC) 20-25 MG per tablet    Sig: Take 1 tablet by mouth daily.    Dispense:  90 tablet    Refill:  3  . Pitavastatin Calcium (LIVALO) 2 MG TABS    Sig: Take 1 tablet (2 mg total) by mouth daily.    Dispense:  90 tablet    Refill:  3  . ezetimibe (ZETIA) 10 MG tablet    Sig: Take 1 tablet (10 mg total) by mouth daily.    Dispense:  90 tablet    Refill:  3  . sitaGLIPtin-metformin (JANUMET) 50-1000 MG per tablet    Sig: Take 1 tablet by mouth 2 (two) times daily with a meal.    Dispense:  180 tablet    Refill:  3   . gabapentin (NEURONTIN) 600 MG tablet  Sig: Take 1 tablet (600 mg total) by mouth 4 (four) times daily.    Dispense:  270 tablet    Refill:  3  . ALPRAZolam (XANAX) 0.5 MG tablet    Sig: Take 1 tablet (0.5 mg total) by mouth at bedtime as needed.    Dispense:  90 tablet    Refill:  1  . Suvorexant (BELSOMRA) 10 MG TABS    Sig: Take 1 tablet by mouth at bedtime as needed.    Dispense:  10 tablet    Refill:  0   Patient Instructions                       Medicare Annual Wellness Visit  [facility] and the medical providers at [facility] strive to bring you the best medical care.  In doing so we not only want to address your current medical conditions and concerns but also to detect new conditions early and prevent illness, disease and health-related problems.    Medicare offers a yearly Wellness Visit which allows our clinical staff to assess your need for preventative services including immunizations, lifestyle education, counseling to decrease risk of preventable diseases and screening for fall risk and other medical concerns.    This visit is provided free of charge (no copay) for all Medicare recipients. The clinical pharmacists at [facility] have begun to conduct these Wellness Visits which will also include a thorough review of all your medications.    As you primary medical provider recommend that you make an appointment for your Annual Wellness Visit if you have not done so already this year.  You may set up this appointment before you leave today or you may call back ([phone]) and schedule an appointment.  Please make sure when you call that you mention that you are scheduling your Annual Wellness Visit with the clinical pharmacist so that the appointment may be made for the proper length of time.     Continue current medications. Continue good therapeutic lifestyle changes which include good diet and exercise. Fall precautions  discussed with patient. If an FOBT was given today- please return it to our front desk. If you are over 31 years old - you may need Prevnar 58 or the adult Pneumonia vaccine.  Flu Shots are still available at our office. If you still haven't had one please call to set up a nurse visit to get one.   After your visit with [place] today you will receive a survey in the mail or online from [employer] regarding your care with [place]. Please take a moment to fill this out. Your feedback is very important to [place] as you can help [place] better understand your patient needs as well as improve your experience and satisfaction. WE CARE ABOUT YOU!!!   Continue regular follow-up with orthopedic surgeon Continue to exercise as much as tolerated even if you have to lean over the grocery cart while your walking Keep your weight down as much as possible, watch sodium intake and watch carbs in the diet    Arrie Senate MD

## 2014-08-30 NOTE — Patient Instructions (Addendum)
Medicare Annual Wellness Visit  [facility] and the medical providers at [facility] strive to bring you the best medical care.  In doing so we not only want to address your current medical conditions and concerns but also to detect new conditions early and prevent illness, disease and health-related problems.    Medicare offers a yearly Wellness Visit which allows our clinical staff to assess your need for preventative services including immunizations, lifestyle education, counseling to decrease risk of preventable diseases and screening for fall risk and other medical concerns.    This visit is provided free of charge (no copay) for all Medicare recipients. The clinical pharmacists at [facility] have begun to conduct these Wellness Visits which will also include a thorough review of all your medications.    As you primary medical provider recommend that you make an appointment for your Annual Wellness Visit if you have not done so already this year.  You may set up this appointment before you leave today or you may call back ([phone]) and schedule an appointment.  Please make sure when you call that you mention that you are scheduling your Annual Wellness Visit with the clinical pharmacist so that the appointment may be made for the proper length of time.     Continue current medications. Continue good therapeutic lifestyle changes which include good diet and exercise. Fall precautions discussed with patient. If an FOBT was given today- please return it to our front desk. If you are over 2 years old - you may need Prevnar 89 or the adult Pneumonia vaccine.  Flu Shots are still available at our office. If you still haven't had one please call to set up a nurse visit to get one.   After your visit with [place] today you will receive a survey in the mail or online from [employer] regarding your care with [place]. Please take a moment to  fill this out. Your feedback is very important to [place] as you can help [place] better understand your patient needs as well as improve your experience and satisfaction. WE CARE ABOUT YOU!!!   Continue regular follow-up with orthopedic surgeon Continue to exercise as much as tolerated even if you have to lean over the grocery cart while your walking Keep your weight down as much as possible, watch sodium intake and watch carbs in the diet

## 2014-08-31 LAB — BMP8+EGFR
BUN/Creatinine Ratio: 21 (ref 11–26)
BUN: 15 mg/dL (ref 8–27)
CALCIUM: 9.5 mg/dL (ref 8.7–10.3)
CO2: 24 mmol/L (ref 18–29)
Chloride: 95 mmol/L — ABNORMAL LOW (ref 97–108)
Creatinine, Ser: 0.7 mg/dL (ref 0.57–1.00)
GFR calc non Af Amer: 89 mL/min/{1.73_m2} (ref >59)
GFR, EST AFRICAN AMERICAN: 103 mL/min/{1.73_m2} (ref >59)
Glucose: 106 mg/dL — ABNORMAL HIGH (ref 65–99)
POTASSIUM: 4.8 mmol/L (ref 3.5–5.2)
Sodium: 136 mmol/L (ref 134–144)

## 2014-08-31 LAB — HEPATIC FUNCTION PANEL
ALK PHOS: 60 IU/L (ref 39–117)
ALT: 44 IU/L — ABNORMAL HIGH (ref 0–32)
AST: 42 IU/L — AB (ref 0–40)
Albumin: 4.3 g/dL (ref 3.6–4.8)
Bilirubin Total: 0.3 mg/dL (ref 0.0–1.2)
Bilirubin, Direct: 0.1 mg/dL (ref 0.00–0.40)
TOTAL PROTEIN: 6.8 g/dL (ref 6.0–8.5)

## 2014-08-31 LAB — NMR, LIPOPROFILE
Cholesterol: 155 mg/dL (ref 100–199)
HDL Cholesterol by NMR: 47 mg/dL (ref >39)
HDL PARTICLE NUMBER: 35.7 umol/L (ref >=30.5)
LDL Particle Number: 1149 nmol/L — ABNORMAL HIGH (ref <1000)
LDL Size: 20.4 nm (ref >20.5)
LDL-C: 80 mg/dL (ref 0–99)
LP-IR Score: 62 — ABNORMAL HIGH (ref <=45)
Small LDL Particle Number: 651 nmol/L — ABNORMAL HIGH (ref <=527)
Triglycerides by NMR: 142 mg/dL (ref 0–149)

## 2014-08-31 LAB — VITAMIN D 25 HYDROXY (VIT D DEFICIENCY, FRACTURES): Vit D, 25-Hydroxy: 37.7 ng/mL (ref 30.0–100.0)

## 2014-09-18 LAB — HM MAMMOGRAPHY

## 2014-09-27 ENCOUNTER — Other Ambulatory Visit: Payer: Medicare Other | Admitting: Nurse Practitioner

## 2014-10-12 ENCOUNTER — Other Ambulatory Visit: Payer: Self-pay

## 2014-10-12 MED ORDER — GABAPENTIN 600 MG PO TABS: 600.0000 mg | ORAL_TABLET | Freq: Four times a day (QID) | ORAL | Status: DC

## 2014-10-18 ENCOUNTER — Other Ambulatory Visit: Payer: Self-pay | Admitting: Family Medicine

## 2014-10-18 ENCOUNTER — Other Ambulatory Visit: Payer: Self-pay

## 2014-10-18 MED ORDER — BLOOD GLUCOSE TEST VI STRP: ORAL_STRIP | Status: DC

## 2014-10-22 ENCOUNTER — Telehealth: Payer: Self-pay | Admitting: Family Medicine

## 2014-10-22 NOTE — Telephone Encounter (Signed)
Try to get insurance approval as patient is intolerant to all other statins

## 2014-10-26 ENCOUNTER — Encounter: Payer: Self-pay | Admitting: Family Medicine

## 2014-10-26 ENCOUNTER — Telehealth: Payer: Self-pay | Admitting: Family Medicine

## 2014-10-26 NOTE — Telephone Encounter (Signed)
Spoke with [employer]

## 2014-11-02 ENCOUNTER — Encounter: Payer: Self-pay | Admitting: Family Medicine

## 2014-11-05 ENCOUNTER — Other Ambulatory Visit: Payer: Medicare Other | Admitting: Nurse Practitioner

## 2014-11-08 ENCOUNTER — Encounter: Payer: Self-pay | Admitting: Family

## 2014-11-08 ENCOUNTER — Ambulatory Visit (INDEPENDENT_AMBULATORY_CARE_PROVIDER_SITE_OTHER): Payer: Medicare Other | Admitting: Family

## 2014-11-08 VITALS — BP 121/79 | HR 82 | Temp 97.4°F | Ht 66.0 in | Wt 211.4 lb

## 2014-11-08 DIAGNOSIS — Z01419 Encounter for gynecological examination (general) (routine) without abnormal findings: Secondary | ICD-10-CM | POA: Diagnosis not present

## 2014-11-08 NOTE — Patient Instructions (Signed)

## 2014-11-08 NOTE — Progress Notes (Signed)
   Subjective:    [name], female    DOB: 1946/01/13, 69 y.o.   MRN: [id number]  HPI Pt presents to the office today for Pap. Pt is followed by Dr. Laurance Flatten for medication refill and chronic follow up every 3 months. Pt denies any headache, palpitations, SOB, or edema at this time.     Review of Systems  Constitutional: Negative.   HENT: Negative.   Eyes: Negative.   Respiratory: Negative.  Negative for shortness of breath.   Cardiovascular: Negative.  Negative for palpitations.  Gastrointestinal: Negative.   Endocrine: Negative.   Genitourinary: Negative.   Musculoskeletal: Negative.   Neurological: Negative.  Negative for headaches.  Hematological: Negative.   Psychiatric/Behavioral: Negative.   All other systems reviewed and are negative.      Objective:   Physical Exam  Constitutional: She is oriented to person, place, and time. She appears well-developed and well-nourished. No distress.  HENT:  Head: Normocephalic and atraumatic.  Right Ear: External ear normal.  Mouth/Throat: Oropharynx is clear and moist.  Eyes: Pupils are equal, round, and reactive to light.  Neck: Normal range of motion. Neck supple. No thyromegaly present.  Cardiovascular: Normal rate, regular rhythm, normal heart sounds and intact distal pulses.   No murmur heard. Pulmonary/Chest: Effort normal and breath sounds normal. No respiratory distress. She has no wheezes. Right breast exhibits no inverted nipple, no mass, no nipple discharge, no skin change and no tenderness. Left breast exhibits no inverted nipple, no mass, no nipple discharge, no skin change and no tenderness. Breasts are symmetrical.  Abdominal: Soft. Bowel sounds are normal. She exhibits no distension. There is no tenderness.  Genitourinary: Vagina normal.  Bimanual exam- no adnexal masses or tenderness, ovaries nonpalpable   Cervix not present   Musculoskeletal: Normal range of motion. She exhibits no edema or  tenderness.  Neurological: She is alert and oriented to person, place, and time. She has normal reflexes. No cranial nerve deficit.  Skin: Skin is warm and dry.  Psychiatric: She has a normal mood and affect. Her behavior is normal. Judgment and thought content normal.  Vitals reviewed.   BP 121/79 mmHg  Pulse 82  Temp(Src) 97.4 F (36.3 C)  Ht 5\' 6"  (1.676 m)  Wt 211 lb 6.4 oz (95.89 kg)  BMI 34.14 kg/m2       Assessment & Plan:  1. Encounter for routine gynecological examination - Pap IG (Image Guided)   Continue all meds Labs pending Health Maintenance reviewed Diet and exercise encouraged RTO as needed and keep chronic follow up with Dr. Ethlyn Daniels, FNP

## 2014-11-12 LAB — PAP IG (IMAGE GUIDED): PAP Smear Comment: 0

## 2015-01-14 ENCOUNTER — Encounter: Payer: Self-pay | Admitting: *Deleted

## 2015-01-14 ENCOUNTER — Encounter: Payer: Self-pay | Admitting: Family Medicine

## 2015-01-14 ENCOUNTER — Ambulatory Visit (INDEPENDENT_AMBULATORY_CARE_PROVIDER_SITE_OTHER): Payer: Medicare Other | Admitting: Family Medicine

## 2015-01-14 VITALS — BP 105/65 | HR 78 | Temp 98.2°F | Ht 66.0 in | Wt 219.0 lb

## 2015-01-14 DIAGNOSIS — I10 Essential (primary) hypertension: Secondary | ICD-10-CM | POA: Diagnosis not present

## 2015-01-14 DIAGNOSIS — L659 Nonscarring hair loss, unspecified: Secondary | ICD-10-CM | POA: Diagnosis not present

## 2015-01-14 DIAGNOSIS — M797 Fibromyalgia: Secondary | ICD-10-CM | POA: Diagnosis not present

## 2015-01-14 DIAGNOSIS — R197 Diarrhea, unspecified: Secondary | ICD-10-CM

## 2015-01-14 DIAGNOSIS — E785 Hyperlipidemia, unspecified: Secondary | ICD-10-CM | POA: Diagnosis not present

## 2015-01-14 DIAGNOSIS — K219 Gastro-esophageal reflux disease without esophagitis: Secondary | ICD-10-CM

## 2015-01-14 DIAGNOSIS — E119 Type 2 diabetes mellitus without complications: Secondary | ICD-10-CM | POA: Diagnosis not present

## 2015-01-14 DIAGNOSIS — R109 Unspecified abdominal pain: Secondary | ICD-10-CM

## 2015-01-14 DIAGNOSIS — G629 Polyneuropathy, unspecified: Secondary | ICD-10-CM | POA: Diagnosis not present

## 2015-01-14 DIAGNOSIS — R195 Other fecal abnormalities: Secondary | ICD-10-CM

## 2015-01-14 DIAGNOSIS — E559 Vitamin D deficiency, unspecified: Secondary | ICD-10-CM

## 2015-01-14 DIAGNOSIS — E11610 Type 2 diabetes mellitus with diabetic neuropathic arthropathy: Secondary | ICD-10-CM

## 2015-01-14 DIAGNOSIS — I739 Peripheral vascular disease, unspecified: Secondary | ICD-10-CM | POA: Diagnosis not present

## 2015-01-14 DIAGNOSIS — Z23 Encounter for immunization: Secondary | ICD-10-CM | POA: Diagnosis not present

## 2015-01-14 LAB — POCT GLYCOSYLATED HEMOGLOBIN (HGB A1C): Hemoglobin A1C: 7

## 2015-01-14 MED ORDER — ESOMEPRAZOLE MAGNESIUM 40 MG PO CPDR: DELAYED_RELEASE_CAPSULE | ORAL | Status: DC

## 2015-01-14 NOTE — Patient Instructions (Addendum)
Medicare Annual Wellness Visit  [facility] and the medical providers at [facility] strive to bring you the best medical care.  In doing so we not only want to address your current medical conditions and concerns but also to detect new conditions early and prevent illness, disease and health-related problems.    Medicare offers a yearly Wellness Visit which allows our clinical staff to assess your need for preventative services including immunizations, lifestyle education, counseling to decrease risk of preventable diseases and screening for fall risk and other medical concerns.    This visit is provided free of charge (no copay) for all Medicare recipients. The clinical pharmacists at [facility] have begun to conduct these Wellness Visits which will also include a thorough review of all your medications.    As you primary medical provider recommend that you make an appointment for your Annual Wellness Visit if you have not done so already this year.  You may set up this appointment before you leave today or you may call back ([phone]) and schedule an appointment.  Please make sure when you call that you mention that you are scheduling your Annual Wellness Visit with the clinical pharmacist so that the appointment may be made for the proper length of time.     Continue current medications. Continue good therapeutic lifestyle changes which include good diet and exercise. Fall precautions discussed with patient. If an FOBT was given today- please return it to our front desk. If you are over 67 years old - you may need Prevnar 92 or the adult Pneumonia vaccine.  **Flu shots will be available soon--- please call and schedule a FLU-CLINIC appointment**  After your visit with [place] today you will receive a survey in the mail or online from [employer] regarding your care with [place]. Please take a moment to fill this out. Your feedback is  very important to [place] as you can help [place] better understand your patient needs as well as improve your experience and satisfaction. WE CARE ABOUT YOU!!!   The patient should take the Nexium in the morning before breakfast and the Zantac in the evening before dinner. If the Zantac does not work she should increase the Nexium and take it twice daily before breakfast and dinner. The patient should continue to avoid caffeine as much as possible and milk and dairy products as much as possible We will try to get an earlier appointment with the gastroenterologist because of the ongoing loose bowel movements She should take a probiotic over-the-counter regularly, align She should continue to monitor blood sugars at home She should call us back with the request for an antibiotic for urinary tract infection but she should not take this unless absolutely needed because of her GI problems She should eat her evening meal as early in the evening as possible and should reduce the volume of this female as much as possible We will check about the side effects of Nicorette chewing gum. I am sure that a complainer role with increased reflux issues. I'm not sure about the loose bowel movement issues and we will follow-up on this and let you know about this. Additionally we will get stool specimens for culture and sensitivity and C. difficile

## 2015-01-14 NOTE — Progress Notes (Signed)
Tammy,  DWM wants to know your thoughts on Nicotine Polacrilex Gum 2 mg. The patient is chewing about 12 a day.  She complains of diarrhea and abdominal pain at today's appt. Could this be causing her some problems?  Thanks, Roselyn Reef

## 2015-01-14 NOTE — Progress Notes (Signed)
[name], female   DOB: 21-Apr-1945, 69 y.o.   MRN: [id number]  Diarrhea and nausea can be signs of too much nicotine.   Although 24 pieces of gum per day is the usual maximum.   I would recommend patient decrease or even stop use until resolved.  Maybe could add patches - 14mg  (mid range dose) to assist and only use gum as needed for breakthrough cravings.

## 2015-01-14 NOTE — Progress Notes (Signed)
Subjective:    [name], female    DOB: 10/18/1945, 69 y.o.   MRN: [id number]  HPI Pt here for follow up and management of chronic medical problems which includes hypertension, hyperlipidemia, and diabetes. She is taking medications regularly. The patient comes in today with several complaints which include hair loss reflux and abnormal bowel movements. She's been seeing the gastroenterologist, Dr. Henrene Pastor regarding the loose stools gas bloating and diarrhea. She is also consulted with him about reflux. She would like to have an antibiotic on hand in case she develops a urinary tract infection. She is needing to get her flu shot today. She says her blood sugars and August and September average about 150 fasting and this is before breakfast. The patient does have some chest pain which is mild which she attributes to the indigestion. She is chewing about 12 Nicorette chewing gum pieces daily. She has increased reflux and heartburn and would like to increase her Nexium to twice daily. She has not seen any blood in the stool. She is having about 4 bowel movements daily and this is been going on for a couple of months. She's been doing the Nicorette for much much longer period time. She is only seen blood in the stool on one occasion. She is passing her water without problems. She indicates that her blood sugars are averaging about 150 on a daily basis before breakfast.      Patient Active Problem List   Diagnosis Date Noted  . Depression 02/28/2013  . GERD (gastroesophageal reflux disease) 02/28/2013  . Hiatal hernia with gastroesophageal reflux 02/28/2013  . Peripheral neuropathy ([place]) 02/28/2013  . DDD (degenerative disc disease), lumbosacral 06/23/2012  . Fibromyalgia syndrome 06/23/2012  . Essential hypertension, benign 06/23/2012  . Osteopenia 06/23/2012  . Type II or unspecified type diabetes mellitus without mention of complication, not stated as uncontrolled 06/23/2012  .  Abnormal EKG 10/29/2010  . Obesity due to excess calories 10/29/2010  . 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 DYSPHAGIA 10/11/2009  . PERSONAL HISTORY OF COLONIC POLYPS 10/11/2009   Outpatient Encounter Prescriptions as of 01/14/2015  Medication Sig  . ALPRAZolam (XANAX) 0.5 MG tablet Take 1 tablet (0.5 mg total) by mouth at bedtime as needed.  . ARIPiprazole (ABILIFY) 2 MG tablet TAKE 1 TABLET EVERY DAY  . Biotin 5000 MCG CAPS Take 5,000 mcg by mouth daily.   . Blood Glucose Monitoring Suppl DEVI Blood glucose meter One Touch Brand. Use to check BS up to BID. DX 250.02  . cholecalciferol (VITAMIN D) 1000 UNITS tablet Take 1,000 Units by mouth daily.   Marland Kitchen conjugated estrogens (PREMARIN) vaginal cream Place 1 Applicatorful vaginally at bedtime as needed. Use q hs  . escitalopram (LEXAPRO) 20 MG tablet Take 1 tablet (20 mg total) by mouth daily.  Marland Kitchen esomeprazole (NEXIUM) 40 MG capsule TAKE 1 CAPSULE (40 MG TOTAL) BY MOUTH DAILY AT 12 NOON.  Marland Kitchen ezetimibe (ZETIA) 10 MG tablet Take 1 tablet (10 mg total) by mouth daily.  Marland Kitchen gabapentin (NEURONTIN) 600 MG tablet Take 1 tablet (600 mg total) by mouth 4 (four) times daily.  . Glucose Blood (BLOOD GLUCOSE TEST STRIPS) STRP Please dispense test strips that go with glucometer that patient's insurance will cover. Use to check BG twice daily.  DX:  250.02  . glucose monitoring kit (FREESTYLE) monitoring kit Please dispense whichever glucometer patient's insurance will cover.  Use to check BG up to twice.  DX: 250.02  . HYDROcodone-acetaminophen ([place])  7.5-325 MG per tablet   . Lancets 30G MISC Use to check BG twice daily.  Dx:  250.02  . lisinopril-hydrochlorothiazide (PRINZIDE,ZESTORETIC) 20-25 MG per tablet Take 1 tablet by mouth daily.  . Omega-3 Fatty Acids (FISH OIL) 1000 MG CAPS Take by mouth 2 (two) times daily.    [name] DELICA LANCETS 47W MISC USE ONE  TO CHECK GLUCOSE LEVELS UP TO TWICE DAILY  . sitaGLIPtin-metformin (JANUMET)  50-1000 MG per tablet Take 1 tablet by mouth 2 (two) times daily with a meal.  . [DISCONTINUED] Pitavastatin Calcium (LIVALO) 2 MG TABS Take 1 tablet (2 mg total) by mouth daily.  . [DISCONTINUED] Suvorexant (BELSOMRA) 10 MG TABS Take 1 tablet by mouth at bedtime as needed.   No facility-administered encounter medications on file as of 01/14/2015.      Review of Systems  Constitutional: Negative.   HENT: Negative.   Eyes: Negative.   Respiratory: Negative.   Cardiovascular: Negative.   Gastrointestinal: Positive for abdominal pain and diarrhea.  Endocrine: Negative.        Hair loss  Genitourinary: Negative.   Musculoskeletal: Negative.   Skin: Negative.   Allergic/Immunologic: Negative.   Neurological: Negative.   Hematological: Negative.   Psychiatric/Behavioral: Negative.        Objective:   Physical Exam  Constitutional: She is oriented to person, place, and time. She appears well-developed and well-nourished. No distress.  The patient is pleasant and alert.  HENT:  Head: Normocephalic and atraumatic.  Right Ear: External ear normal.  Left Ear: External ear normal.  Nose: Nose normal.  Mouth/Throat: Oropharynx is clear and moist.  Eyes: Conjunctivae and EOM are normal. Pupils are equal, round, and reactive to light. Right eye exhibits no discharge. Left eye exhibits no discharge. No scleral icterus.  Neck: Normal range of motion. Neck supple. No thyromegaly present.  There is no anterior cervical adenopathy carotid bruits or thyromegaly  Cardiovascular: Normal rate, regular rhythm and normal heart sounds.   No murmur heard. The pedal pulses were slightly decreased in the right foot At 72/m  Pulmonary/Chest: Effort normal and breath sounds normal. No respiratory distress. She has no wheezes. She has no rales. She exhibits no tenderness.  Clear anteriorly and posteriorly  Abdominal: Soft. Bowel sounds are normal. She exhibits no mass. There is tenderness. There is no  rebound and no guarding.  There was some epigastric abdominal tenderness and no suprapubic tenderness  Musculoskeletal: Normal range of motion. She exhibits no edema or tenderness.  Lymphadenopathy:    She has no cervical adenopathy.  Neurological: She is alert and oriented to person, place, and time. She has normal reflexes. No cranial nerve deficit.  Skin: Skin is warm and dry. No rash noted.  Psychiatric: She has a normal mood and affect. Her behavior is normal. Judgment and thought content normal.  Despite all her medical complaints she seemed to be in good spirits and was smiling.  Nursing note and vitals reviewed.  BP 105/65 mmHg  Pulse 78  Temp(Src) 98.2 F (36.8 C) (Oral)  Ht 5' 6"  (1.676 m)  Wt 219 lb (99.338 kg)  BMI 35.36 kg/m2        Assessment & Plan:  1. Vitamin D deficiency -Continue current treatment pending results of lab wor- CBC with Differential/Platelet - Vit D  25 hydroxy (rtn osteoporosis monitoring)  2. Hyperlipidemia -Continue current treatment pending results of lab work - CBC with Differential/Platelet - NMR, lipoprofile  3. Essential hypertension, benign -Blood pressure is  good today and the patient should continue with current treatment - BMP8+EGFR - CBC with Differential/Platelet - Hepatic function panel  4. Gastroesophageal reflux disease, esophagitis presence not specified -The patient is having more reflux issues and continues to have problems with loose bowel movements. - CBC with Differential/Platelet - Hepatic function panel  5. Type 2 diabetes mellitus without complication, without long-term current use of insulin (HCC) -The blood sugars are averaging about 150 in the morning according to the patient and we will see what the A1c is to determine if a treatment change needs to be made with this. - POCT glycosylated hemoglobin (Hb A1C) - CBC with Differential/Platelet  6. Fibromyalgia syndrome -She continues to have pain associated  with fibromyalgia - CBC with Differential/Platelet  7. Hair loss - Thyroid Panel With TSH  8. Abdominal pain, unspecified abdominal location -The patient has an appointment scheduled with the gastroenterologist in early November we will see what we can get this moved up. In the meantime we will check stools for C. difficile and culture and sensitivity. We will encourage her to leave off the caffeine and milk cheese ice cream and dairy products. We will also encourage her to do a probiotic. - BMP8+EGFR - CBC with Differential/Platelet - Hepatic function panel  9. Peripheral polyneuropathy ([place]) -This is most likely associated with her back problems and her diabetes.  10. 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 Loose bowel movements -Leave off caffeine and milk cheese ice cream and dairy products and take probiotic and return stool specimen for culture and sensitivity and C. difficile  12. Peripheral vascular insufficiency (HCC) -The pulses were difficult to palpate in the left lower extremity today.  Patient Instructions                       Medicare Annual Wellness Visit  [facility] and the medical providers at [facility] strive to bring you the best medical care.  In doing so we not only want to address your current medical conditions and concerns but also to detect new conditions early and prevent illness, disease and health-related problems.    Medicare offers a yearly Wellness Visit which allows our clinical staff to assess your need for preventative services including immunizations, lifestyle education, counseling to decrease risk of preventable diseases and screening for fall risk and other medical concerns.    This visit is provided free of charge (no copay) for all Medicare recipients. The clinical pharmacists at [facility] have begun to conduct these Wellness Visits which will also include a thorough review of all your  medications.    As you primary medical provider recommend that you make an appointment for your Annual Wellness Visit if you have not done so already this year.  You may set up this appointment before you leave today or you may call back ([phone]) and schedule an appointment.  Please make sure when you call that you mention that you are scheduling your Annual Wellness Visit with the clinical pharmacist so that the appointment may be made for the proper length of time.     Continue current medications. Continue good therapeutic lifestyle changes which include good diet and exercise. Fall precautions discussed with patient. If an FOBT was given today- please return it to our front desk. If you are over 22 years old - you may need Prevnar 94 or the adult Pneumonia vaccine.  **Flu shots will be available soon--- please call and schedule  a FLU-CLINIC appointment**  After your visit with [place] today you will receive a survey in the mail or online from [employer] regarding your care with [place]. Please take a moment to fill this out. Your feedback is very important to [place] as you can help [place] better understand your patient needs as well as improve your experience and satisfaction. WE CARE ABOUT YOU!!!   The patient should take the Nexium in the morning before breakfast and the Zantac in the evening before dinner. If the Zantac does not work she should increase the Nexium and take it twice daily before breakfast and dinner. The patient should continue to avoid caffeine as much as possible and milk and dairy products as much as possible We will try to get an earlier appointment with the gastroenterologist because of the ongoing loose bowel movements She should take a probiotic over-the-counter regularly, align She should continue to monitor blood sugars at home She should call us back with the request for an antibiotic for urinary tract infection but she should not take this unless absolutely needed because of  her GI problems She should eat her evening meal as early in the evening as possible and should reduce the volume of this female as much as possible We will check about the side effects of Nicorette chewing gum. I am sure that a complainer role with increased reflux issues. I'm not sure about the loose bowel movement issues and we will follow-up on this and let you know about this. Additionally we will get stool specimens for culture and sensitivity and C. difficile   Arrie Senate MD

## 2015-01-14 NOTE — Progress Notes (Signed)
Please call patient with pharmacy recommendation

## 2015-01-15 ENCOUNTER — Telehealth: Payer: Self-pay | Admitting: Family Medicine

## 2015-01-15 ENCOUNTER — Other Ambulatory Visit: Payer: Medicare Other

## 2015-01-15 DIAGNOSIS — R197 Diarrhea, unspecified: Secondary | ICD-10-CM

## 2015-01-15 DIAGNOSIS — Z1212 Encounter for screening for malignant neoplasm of rectum: Secondary | ICD-10-CM

## 2015-01-15 LAB — NMR, LIPOPROFILE
Cholesterol: 214 mg/dL — ABNORMAL HIGH (ref 100–199)
HDL CHOLESTEROL BY NMR: 49 mg/dL (ref >39)
HDL Particle Number: 36.2 umol/L (ref >=30.5)
LDL PARTICLE NUMBER: 1919 nmol/L — AB (ref <1000)
LDL Size: 20.5 nm (ref >20.5)
LDL-C: 123 mg/dL — ABNORMAL HIGH (ref 0–99)
LP-IR Score: 73 — ABNORMAL HIGH (ref <=45)
Small LDL Particle Number: 1116 nmol/L — ABNORMAL HIGH (ref <=527)
TRIGLYCERIDES BY NMR: 210 mg/dL — AB (ref 0–149)

## 2015-01-15 LAB — BMP8+EGFR
BUN/Creatinine Ratio: 16 (ref 11–26)
BUN: 11 mg/dL (ref 8–27)
CO2: 23 mmol/L (ref 18–29)
Calcium: 9.6 mg/dL (ref 8.7–10.3)
Chloride: 95 mmol/L — ABNORMAL LOW (ref 97–108)
Creatinine, Ser: 0.7 mg/dL (ref 0.57–1.00)
GFR calc Af Amer: 102 mL/min/{1.73_m2} (ref >59)
GFR, EST NON AFRICAN AMERICAN: 89 mL/min/{1.73_m2} (ref >59)
Glucose: 130 mg/dL — ABNORMAL HIGH (ref 65–99)
POTASSIUM: 4.9 mmol/L (ref 3.5–5.2)
SODIUM: 135 mmol/L (ref 134–144)

## 2015-01-15 LAB — CBC WITH DIFFERENTIAL/PLATELET
BASOS: 1 %
Basophils Absolute: 0.1 10*3/uL (ref 0.0–0.2)
EOS (ABSOLUTE): 0.2 10*3/uL (ref 0.0–0.4)
Eos: 2 %
Hematocrit: 38.5 % (ref 34.0–46.6)
Hemoglobin: 12.3 g/dL (ref 11.1–15.9)
Immature Grans (Abs): 0 10*3/uL (ref 0.0–0.1)
Immature Granulocytes: 0 %
Lymphocytes Absolute: 2.5 10*3/uL (ref 0.7–3.1)
Lymphs: 37 %
MCH: 27.3 pg (ref 26.6–33.0)
MCHC: 31.9 g/dL (ref 31.5–35.7)
MCV: 86 fL (ref 79–97)
MONOS ABS: 0.9 10*3/uL (ref 0.1–0.9)
Monocytes: 13 %
NEUTROS ABS: 3.2 10*3/uL (ref 1.4–7.0)
NEUTROS PCT: 47 %
PLATELETS: 287 10*3/uL (ref 150–379)
RBC: 4.5 x10E6/uL (ref 3.77–5.28)
RDW: 15 % (ref 12.3–15.4)
WBC: 6.8 10*3/uL (ref 3.4–10.8)

## 2015-01-15 LAB — HEPATIC FUNCTION PANEL
ALT: 77 IU/L — AB (ref 0–32)
AST: 79 IU/L — ABNORMAL HIGH (ref 0–40)
Albumin: 4.2 g/dL (ref 3.6–4.8)
Alkaline Phosphatase: 68 IU/L (ref 39–117)
Bilirubin Total: 0.2 mg/dL (ref 0.0–1.2)
Bilirubin, Direct: 0.1 mg/dL (ref 0.00–0.40)
Total Protein: 6.8 g/dL (ref 6.0–8.5)

## 2015-01-15 LAB — THYROID PANEL WITH TSH
FREE THYROXINE INDEX: 2.1 (ref 1.2–4.9)
T3 Uptake Ratio: 24 % (ref 24–39)
T4 TOTAL: 8.6 ug/dL (ref 4.5–12.0)
TSH: 2.74 u[IU]/mL (ref 0.450–4.500)

## 2015-01-15 LAB — VITAMIN D 25 HYDROXY (VIT D DEFICIENCY, FRACTURES): Vit D, 25-Hydroxy: 35.7 ng/mL (ref 30.0–100.0)

## 2015-01-15 NOTE — Progress Notes (Signed)
Lab only pt brought in stool collection

## 2015-01-15 NOTE — Progress Notes (Signed)
Patient aware.

## 2015-01-15 NOTE — Telephone Encounter (Signed)
Pt came into office today.  

## 2015-01-16 LAB — FECAL OCCULT BLOOD, IMMUNOCHEMICAL: Fecal Occult Bld: NEGATIVE

## 2015-01-18 ENCOUNTER — Telehealth: Payer: Self-pay | Admitting: Family Medicine

## 2015-01-19 LAB — CDIFF NAA+O+P+STOOL CULTURE
E COLI SHIGA TOXIN ASSAY: NEGATIVE
Toxigenic C. Difficile by PCR: NEGATIVE

## 2015-01-21 NOTE — Telephone Encounter (Signed)
Please call a prescription in for Septra DS No. 14 one twice daily with food for infection until completed

## 2015-01-22 MED ORDER — SULFAMETHOXAZOLE-TRIMETHOPRIM 800-160 MG PO TABS: 1.0000 | ORAL_TABLET | Freq: Two times a day (BID) | ORAL | Status: DC

## 2015-01-22 NOTE — Telephone Encounter (Signed)
Phoned pt and sent med to [facility]

## 2015-01-30 ENCOUNTER — Encounter: Payer: Self-pay | Admitting: Pharmacist

## 2015-01-30 ENCOUNTER — Ambulatory Visit (INDEPENDENT_AMBULATORY_CARE_PROVIDER_SITE_OTHER): Payer: Medicare Other | Admitting: Pharmacist

## 2015-01-30 DIAGNOSIS — E785 Hyperlipidemia, unspecified: Secondary | ICD-10-CM | POA: Diagnosis not present

## 2015-01-30 DIAGNOSIS — E1169 Type 2 diabetes mellitus with other specified complication: Secondary | ICD-10-CM | POA: Diagnosis not present

## 2015-01-30 MED ORDER — PITAVASTATIN CALCIUM 2 MG PO TABS: 2.0000 mg | ORAL_TABLET | ORAL | Status: DC

## 2015-01-30 NOTE — Progress Notes (Signed)
Subjective:    [name] is a 69 y.o. female who presents for evaluation of dyslipidemia. The patient does use medications that may worsen dyslipidemias (corticosteroids, progestins, anabolic steroids, diuretics, beta-blockers, amiodarone, cyclosporine, olanzapine). Exercise: never and this is due to back pain. Previous history of cardiac disease includes: None.  Patient was presivouly on Livalo 2mg  qod but stopped due to cost of medication.  She is taking Zetia 10mg  qd.    She has been unable to tolerate other statins.   Cardiac Risk Factors - type 2 DM  The following portions of the patient's history were reviewed and updated as appropriate: allergies, current medications, past family history, past medical history, past social history, past surgical history and problem list.   Objective:    Lab Review Lab on 01/15/2015  Component Date Value  . Fecal Occult Bld 01/15/2015 Negative   . Salmonella/Shigella Scre* 01/15/2015 Final report   . RESULT 1 01/15/2015 Comment   . Campylobacter Culture 01/15/2015 Final report   . RESULT 1 01/15/2015 Comment   . E coli, Shiga toxin Assay 01/15/2015 Negative   . OVA + PARASITE EXAM 01/15/2015 Final report   . O&P result 1 01/15/2015 Comment   . Toxigenic C Difficile by* 01/15/2015 Negative   Office Visit on 01/14/2015  Component Date Value  . Hemoglobin A1C 01/14/2015 7.0   . Glucose 01/14/2015 130*  . BUN 01/14/2015 11   . Creatinine, Ser 01/14/2015 0.70   . GFR calc non Af Amer 01/14/2015 89   . GFR calc Af Amer 01/14/2015 102   . BUN/Creatinine Ratio 01/14/2015 16   . Sodium 01/14/2015 135   . Potassium 01/14/2015 4.9   . Chloride 01/14/2015 95*  . CO2 01/14/2015 23   . Calcium 01/14/2015 9.6   . WBC 01/14/2015 6.8   . RBC 01/14/2015 4.50   . Hemoglobin 01/14/2015 12.3   . Hematocrit 01/14/2015 38.5   . MCV 01/14/2015 86   . [place]nel[place] 01/14/2015 27.3   . MCHC 01/14/2015 31.9   . RDW 01/14/2015 15.0   . Platelets 01/14/2015 287    . Neutrophils 01/14/2015 47   . Lymphs 01/14/2015 37   . Monocytes 01/14/2015 13   . Eos 01/14/2015 2   . Basos 01/14/2015 1   . Neutrophils Absolute 01/14/2015 3.2   . Lymphocytes Absolute 01/14/2015 2.5   . Monocytes Absolute 01/14/2015 0.9   . EOS (ABSOLUTE) 01/14/2015 0.2   . Basophils Absolute 01/14/2015 0.1   . Immature Granulocytes 01/14/2015 0   . Immature Grans (Abs) 01/14/2015 0.0   . Total Protein 01/14/2015 6.8   . Albumin 01/14/2015 4.2   . Bilirubin Total 01/14/2015 0.2   . Bilirubin, Direct 01/14/2015 0.10   . Alkaline Phosphatase 01/14/2015 68   . AST 01/14/2015 79*  . ALT 01/14/2015 77*  . LDL Particle Number 01/14/2015 1919*  . LDL-C 01/14/2015 123*  . HDL Cholesterol by NMR 01/14/2015 49   . Triglycerides by NMR 01/14/2015 210*  . Cholesterol 01/14/2015 214*  . HDL Particle Number 01/14/2015 36.2   . Small LDL Particle Number 01/14/2015 1116*  . LDL Size 01/14/2015 20.5   . LP-IR Score 01/14/2015 73*  . Vit D, 25-Hydroxy 01/14/2015 35.7   . TSH 01/14/2015 2.740   . T4, Total 01/14/2015 8.6   . T3 Uptake Ratio 01/14/2015 24   . Free Thyroxine Index 01/14/2015 2.1       Assessment:    Dyslipidemia as detailed above with Type 2 DM  Target levels for LDL are: < 100 mg/dl (CHD or "CHD risk equivalent" is present)  Explained to the patient the respective contributions of genetics, diet, and exercise to lipid levels and the use of medication in severe cases which do not respond to lifestyle alteration. The patient's interest and motivation in making lifestyle changes seems excellent.     Plan:     1. Dietary changes: discussed mediterrean diet 2. Exercise changes:  discussed Chair exercises since patient in unable to walk or participate in other exercise 3. Lipid-lowering medications: restart livalo 2mg  take 1 tablet every other day - gave #10 weeks of samples. Continue Zetia 10mg  1 tablet daily - gave #6 weeks of samples  4.  Since patient does not have  any documented CVD / ASCVD she is not candidate for PSK9 therapy.  RTC in December 2016 - recheck lipids and do AWV.   Cherre Robins, PharmD, CPP

## 2015-02-07 ENCOUNTER — Other Ambulatory Visit (INDEPENDENT_AMBULATORY_CARE_PROVIDER_SITE_OTHER): Payer: Medicare Other

## 2015-02-07 ENCOUNTER — Ambulatory Visit (INDEPENDENT_AMBULATORY_CARE_PROVIDER_SITE_OTHER): Payer: Medicare Other | Admitting: Internal Medicine

## 2015-02-07 ENCOUNTER — Encounter: Payer: Self-pay | Admitting: Internal Medicine

## 2015-02-07 VITALS — BP 136/80 | HR 82 | Ht 66.0 in | Wt 218.5 lb

## 2015-02-07 DIAGNOSIS — K219 Gastro-esophageal reflux disease without esophagitis: Secondary | ICD-10-CM

## 2015-02-07 DIAGNOSIS — R7989 Other specified abnormal findings of blood chemistry: Secondary | ICD-10-CM

## 2015-02-07 DIAGNOSIS — R194 Change in bowel habit: Secondary | ICD-10-CM

## 2015-02-07 DIAGNOSIS — R945 Abnormal results of liver function studies: Secondary | ICD-10-CM

## 2015-02-07 DIAGNOSIS — R197 Diarrhea, unspecified: Secondary | ICD-10-CM

## 2015-02-07 LAB — HEPATITIS C ANTIBODY: HCV AB: NEGATIVE

## 2015-02-07 LAB — PROTIME-INR
INR: 1 ratio (ref 0.8–1.0)
Prothrombin Time: 10.7 s (ref 9.6–13.1)

## 2015-02-07 LAB — IBC PANEL
Iron: 61 ug/dL (ref 42–145)
SATURATION RATIOS: 13.3 % — AB (ref 20.0–50.0)
Transferrin: 327 mg/dL (ref 212.0–360.0)

## 2015-02-07 LAB — IRON: Iron: 61 ug/dL (ref 42–145)

## 2015-02-07 LAB — HEPATITIS B SURFACE ANTIGEN: HEP B S AG: NEGATIVE

## 2015-02-07 LAB — FERRITIN: Ferritin: 32.5 ng/mL (ref 10.0–291.0)

## 2015-02-07 LAB — HEPATITIS B SURFACE ANTIBODY,QUALITATIVE: HEP B S AB: NEGATIVE

## 2015-02-07 NOTE — Patient Instructions (Signed)
Please follow up with Dr. Henrene Pastor on 04/14/2014 at 10:45am  Your physician has requested that you go to the basement for lab work before leaving today

## 2015-02-07 NOTE — Progress Notes (Signed)
HISTORY OF PRESENT ILLNESS:  [name] is a 69 y.o. female with a history of GERD complicated by peptic stricture requiring esophageal dilation, irritable bowel syndrome, fibromyalgia, anxiety/depression, chronic chest pain, hemorrhoids, hypertension, and arthritis. She presents today with chief complaints of (#1) change in bowel habits, (#2) GERD, and (#3) elevated liver tests. The patient has not been seen in this office since 2011. She did undergo surveillance colonoscopy 11/22/2012 and was found to have a hyperplastic polyp and mild sigmoid diverticulosis. Follow-up in 5 years recommended because of a history of multiple adenomas on her examination in 2011. Her last upper endoscopy with dilation was August 2011 ([address]vis). With regards to bowel habit change, she states that this has been going on for approximately 3 months. Prior to that she reports having had normal bowel movements for some time. At that point, she describes loose stools with mucus occurring 2-3 times daily (generally in the morning). There is some associated urgency. No bleeding. Of note, she was placed on Janumet relatively short time prior to the development of symptoms. She denies problems with loose stools and other times of the day or nocturnal symptoms. No fevers. Next, she reports occasional reflux symptoms with dietary indiscretion. Otherwise, symptoms controlled with omeprazole 40 mg daily. She has not had recurrent dysphagia. She requested medication refills as needed. Finally, her primary care physician, Dr. Laurance Flatten, advised her that she has had chronically elevated liver tests. Slightly more recently. I have reviewed outside blood work. She has had chronic elevation of transaminases, generally less than 100. Most recent laboratories from 01/14/2015 revealed AST 79, ALT 77, alkaline phosphatase 68, and total bilirubin 0.2. Her albumin and protein are normal. CBC is normal with normal MCV and normal platelets. TSH is  normal. Last hemoglobin A1c is elevated at 7.0. Patient has had weight gain in recent years. She is a reformed smoker. She has been well over 200 pounds for some time. Current BMI is estimated to be 35+. She denies alcohol use. No family history of liver disease. No prior history of transfusion or IVDA. She did have an abdominal ultrasound in March 2014 that revealed diffusely increased echogenicity consistent with diffuse hepatic steatosis  REVIEW OF SYSTEMS:  All non-GI ROS negative except for arthritis, muscle aches, depression  Past Medical History  Diagnosis Date  . Diabetes mellitus, type 2 ([place]) 06/2010  . Depression   . Hyperlipidemia   . Hypertension   . Hiatal hernia   . Fibromyalgia     [place]   . Esophageal stricture   . Colon polyps   . Arthritis     [place]   . Menopause   . GERD (gastroesophageal reflux disease)   . Vitamin D deficiency   . Peripheral vascular insufficiency ([place])   . Hemorrhoids   . Diverticulosis     Past Surgical History  Procedure Laterality Date  . Total knee arthroplasty      bilateral  . Vesicovaginal fistula closure w/ tah  1981  . Refractive surgery  2003  . Elbow surgery      left  . Ankle surgery      right  . Joint replacement Bilateral     knee  . Abdominal hysterectomy      partial and then total    Social History [name]  reports that she quit smoking about 4 years ago. Her smoking use included Cigarettes. She started smoking about 39 years ago. She smoked 1.00 pack per day. She  has never used smokeless tobacco. She reports that she does not drink alcohol or use illicit drugs.  family history includes Cirrhosis in her brother and father; Diabetes in her mother; Heart failure in her mother; Hypertension in her mother.  Allergies  Allergen Reactions  . Codeine Nausea And Vomiting  . Crestor [Rosuvastatin Calcium]     Joints ache  . Erythromycin Nausea And Vomiting  . Fenofibrate     aching & edema    .  Lyrica [Pregabalin]     Edema in feet   . Penicillins     Blisters in mouth  . Statins     Joints ache  . Sulfa Antibiotics Nausea And Vomiting  . Zocor [Simvastatin]     Joints ache       PHYSICAL EXAMINATION: Vital signs: BP 136/80 mmHg  Pulse 82  Ht 5\' 6"  (1.676 m)  Wt 218 lb 8 oz (99.111 kg)  BMI 35.28 kg/m2  Constitutional: Obese, generally well-appearing, no acute distress Psychiatric: Pleasant, alert and oriented x3, cooperative Eyes: extraocular movements intact, anicteric, conjunctiva pink Mouth: oral pharynx moist, no lesions Neck: supple without thyromegaly Lymph no lymphadenopathy Cardiovascular: heart regular rate and rhythm, no murmur Lungs: clear to auscultation bilaterally Abdomen: soft, obese, nontender, nondistended, no obvious ascites, no peritoneal signs, normal bowel sounds, no organomegaly Rectal: Ommitted Extremities: no clubbing cyanosis or lower extremity edema bilaterally Skin: no lesions on visible extremities. No spider telangiectasias Neuro: No focal deficits. No asterixis.  ASSESSMENT:  #1. Change in bowel habits with loose urgent stools generally in the morning. Timing seems to be related to the initiation of Janumet which contains metformin. If symptoms persist despite this maneuver, additional workup and treatment can be pursued. She does have a history of IBS type symptoms remotely #2. GERD. History of peptic stricture. For the most part doing well on PPI. Occasional breakthrough as discussed #3. Elevated hepatic transaminases. Chronic. Slightly worse. Suspect fatty liver. Rule out other causes such as metabolic disorders, infection, or inherited disorders #4. History of multiple adenomas  PLAN:  #1. The patient has been asked to discuss holding Janumet with her PCP. If bowel habit issues resolve, then this would appear to be the cause. #2. Reflux precautions with attention to weight loss #3. Continue Nexium. Refill as needed #4.  Expanded workup to include viral and nonviral studies for chronic elevation of LFTs. #5. Repeat liver ultrasound #6. Office follow-up in 2 months #7. Surveillance colonoscopy around October 2019

## 2015-02-08 LAB — ANA: Anti Nuclear Antibody(ANA): NEGATIVE

## 2015-02-08 LAB — MITOCHONDRIAL ANTIBODIES: Mitochondrial M2 Ab, IgG: 0.2 (ref <0.91)

## 2015-02-11 LAB — ANTI-SMOOTH MUSCLE ANTIBODY, IGG: SMOOTH MUSCLE AB: 4 U (ref <20)

## 2015-02-11 LAB — ALPHA-1-ANTITRYPSIN: A1 ANTITRYPSIN SER: 145 mg/dL (ref 83–199)

## 2015-02-11 LAB — CERULOPLASMIN: Ceruloplasmin: 24 mg/dL (ref 18–53)

## 2015-02-11 LAB — TISSUE TRANSGLUTAMINASE, IGG: Tissue Transglut Ab: 1 U/mL (ref <6)

## 2015-02-13 ENCOUNTER — Ambulatory Visit (HOSPITAL_COMMUNITY)
Admission: RE | Admit: 2015-02-13 | Discharge: 2015-02-13 | Disposition: A | Discharge status: Home / Self Care | Payer: Medicare Other | Source: Ambulatory Visit | Attending: Internal Medicine | Admitting: Internal Medicine

## 2015-02-13 DIAGNOSIS — R197 Diarrhea, unspecified: Secondary | ICD-10-CM | POA: Diagnosis not present

## 2015-02-13 DIAGNOSIS — N289 Disorder of kidney and ureter, unspecified: Secondary | ICD-10-CM | POA: Diagnosis not present

## 2015-02-13 DIAGNOSIS — R932 Abnormal findings on diagnostic imaging of liver and biliary tract: Secondary | ICD-10-CM | POA: Insufficient documentation

## 2015-02-13 DIAGNOSIS — R7989 Other specified abnormal findings of blood chemistry: Secondary | ICD-10-CM | POA: Diagnosis not present

## 2015-02-13 DIAGNOSIS — R945 Abnormal results of liver function studies: Secondary | ICD-10-CM

## 2015-02-13 DIAGNOSIS — K219 Gastro-esophageal reflux disease without esophagitis: Secondary | ICD-10-CM | POA: Diagnosis not present

## 2015-02-14 ENCOUNTER — Telehealth: Payer: Self-pay

## 2015-02-14 ENCOUNTER — Other Ambulatory Visit: Payer: Self-pay

## 2015-02-14 DIAGNOSIS — N289 Disorder of kidney and ureter, unspecified: Secondary | ICD-10-CM

## 2015-02-15 ENCOUNTER — Other Ambulatory Visit (INDEPENDENT_AMBULATORY_CARE_PROVIDER_SITE_OTHER): Payer: Medicare Other

## 2015-02-15 ENCOUNTER — Telehealth: Payer: Self-pay | Admitting: Pharmacist

## 2015-02-15 DIAGNOSIS — N289 Disorder of kidney and ureter, unspecified: Secondary | ICD-10-CM | POA: Diagnosis not present

## 2015-02-15 LAB — CREATININE, SERUM: Creatinine, Ser: 0.84 mg/dL (ref 0.40–1.20)

## 2015-02-15 LAB — BUN: BUN: 13 mg/dL (ref 6–23)

## 2015-02-15 NOTE — Telephone Encounter (Signed)
Received correspondence from GI - Dr Henrene Pastor that patient is having GI distress related to metformin / Janumet.  I tried to call patient to discuss - she was not at home and tried her cell number but received recording that there was a problem with cell reception.  Will try again later.

## 2015-02-18 ENCOUNTER — Telehealth: Payer: Self-pay | Admitting: Family Medicine

## 2015-02-18 NOTE — Telephone Encounter (Signed)
Stp and she says that her GI doctor told her that the [place] is causing her stomach problems and is there anything else you could give her instead?

## 2015-02-18 NOTE — Telephone Encounter (Signed)
Is probably the metformin part of the Janumet--- please schedule visit with clinical pharmacy as soon as possible

## 2015-02-19 NOTE — Telephone Encounter (Signed)
Patient has appt to discuss meds 02/22/2015

## 2015-02-22 ENCOUNTER — Telehealth: Payer: Self-pay | Admitting: Pharmacist

## 2015-02-22 ENCOUNTER — Ambulatory Visit: Payer: Self-pay | Admitting: Pharmacist

## 2015-02-22 NOTE — Telephone Encounter (Signed)
Patient missed appointment to disucss changed in diabetes medication today.  She has appt for AWV 03/11/15.  She has been experiencing increase loose BM and saw GI about 2 weeks ago.  Dr Henrene Pastor felt GI discomfort is from metformin.  She is currently taking Janumet 50/1000mg  bid.  Instructed patient to decrease to 1 tablet qd with largest meal of day.  She is to call me in 1 weeks if loose BM not improved.

## 2015-03-01 ENCOUNTER — Ambulatory Visit (HOSPITAL_COMMUNITY)
Admission: RE | Admit: 2015-03-01 | Discharge: 2015-03-01 | Disposition: A | Discharge status: Home / Self Care | Payer: Medicare Other | Source: Ambulatory Visit | Attending: Internal Medicine | Admitting: Internal Medicine

## 2015-03-01 DIAGNOSIS — K760 Fatty (change of) liver, not elsewhere classified: Secondary | ICD-10-CM | POA: Insufficient documentation

## 2015-03-01 DIAGNOSIS — N281 Cyst of kidney, acquired: Secondary | ICD-10-CM | POA: Insufficient documentation

## 2015-03-01 DIAGNOSIS — N289 Disorder of kidney and ureter, unspecified: Secondary | ICD-10-CM | POA: Diagnosis not present

## 2015-03-01 MED ORDER — GADOBENATE DIMEGLUMINE 529 MG/ML IV SOLN
20.0000 mL | Freq: Once | INTRAVENOUS | Status: AC | PRN
  Administered 2015-03-01: 20 mL via INTRAVENOUS

## 2015-03-11 ENCOUNTER — Ambulatory Visit (INDEPENDENT_AMBULATORY_CARE_PROVIDER_SITE_OTHER): Payer: Medicare Other | Admitting: Pharmacist

## 2015-03-11 ENCOUNTER — Encounter: Payer: Self-pay | Admitting: Pharmacist

## 2015-03-11 VITALS — BP 130/70 | HR 77 | Ht 66.0 in | Wt 217.0 lb

## 2015-03-11 DIAGNOSIS — Z0000 Encounter for general adult medical examination without abnormal findings: Secondary | ICD-10-CM | POA: Diagnosis not present

## 2015-03-11 DIAGNOSIS — E669 Obesity, unspecified: Secondary | ICD-10-CM

## 2015-03-11 DIAGNOSIS — M8580 Other specified disorders of bone density and structure, unspecified site: Secondary | ICD-10-CM

## 2015-03-11 MED ORDER — SITAGLIP PHOS-METFORMIN HCL ER 100-1000 MG PO TB24: 1.0000 | ORAL_TABLET | Freq: Every day | ORAL | Status: DC

## 2015-03-11 NOTE — Patient Instructions (Signed)
  [name] , Thank you for taking time to come for your Medicare Wellness Visit. I appreciate your ongoing commitment to your health goals. Please review the following plan we discussed and let me know if I can assist you in the future.   These are the goals we discussed: Change to Janumet XR 100/1000mg  - take 1 tablet daily with food.  Try Chair exercises for leg and core strength Bone Density - due January 2017 - order sent today  Increase non-starchy vegetables - carrots, green bean, squash, zucchini, tomatoes, onions, peppers, spinach and other green leafy vegetables, cabbage, lettuce, cucumbers, asparagus, okra (not fried), eggplant limit sugar and processed foods (cakes, cookies, ice cream, crackers and chips) Increase fresh fruit but limit serving sizes 1/2 cup or about the size of tennis or baseball limit red meat to no more than 1-2 times per week (serving size about the size of your palm) Choose whole grains / lean proteins - whole wheat bread, quinoa, whole grain rice (1/2 cup), fish, chicken, [place]    This is a list of the screening recommended for you and due dates:  Health Maintenance  Topic Date Due  . Eye exam for diabetics  02/24/2015  . DEXA scan (bone density measurement)  04/07/2015  . Hemoglobin A1C  07/15/2015  . Urine Protein Check  08/30/2015  . Flu Shot  11/05/2015  . Complete foot exam   01/14/2016  . Stool Blood Test  01/15/2016  . Tetanus Vaccine  04/06/2016  . Mammogram  10/09/2016  . Colon Cancer Screening  11/22/2017  . Shingles Vaccine  Completed  .  Hepatitis C: One time screening is recommended by Center for Disease Control  (CDC) for  adults born from [birth year] through 1965.   Completed  . Pneumonia vaccines  Completed

## 2015-03-11 NOTE — Progress Notes (Signed)
[name], female   DOB: [birth year], 69 y.o.   MRN: [id number]   Subjective:   [name] is a 69 y.o. married, white female who presents for a Subsequent Medicare Annual Wellness Visit  She is pleasant and in NAD distress today.  Regarding her health she feels that she is doing well but wishes that she could exercise more however her back and knee pain limit this.  She has looked into physical therapy in the past but the cost was prohibitive.   He Janumet was recently decreased from [place] 50/1000mg bid to just qd due to GI discomfort and frequent loose BM.  Since the decrease in dose she reports that loose BM and abdominal pain has improved greatly.  HBG readings range from 130's to 150's fasting.  Current Medications (verified) Outpatient Encounter Prescriptions as of 03/11/2015  Medication Sig  . ALPRAZolam (XANAX) 0.5 MG tablet Take 1 tablet (0.5 mg total) by mouth at bedtime as needed.  . ARIPiprazole (ABILIFY) 2 MG tablet TAKE 1 TABLET EVERY DAY (Patient taking differently: TAKE 1/2 TABLET EVERY DAY)  . cholecalciferol (VITAMIN D) 1000 UNITS tablet Take 2,000 Units by mouth daily.   Marland Kitchen conjugated estrogens (PREMARIN) vaginal cream Place 1 Applicatorful vaginally at bedtime as needed. Use q hs  . escitalopram (LEXAPRO) 20 MG tablet Take 1 tablet (20 mg total) by mouth daily.  Marland Kitchen esomeprazole (NEXIUM) 40 MG capsule TAKE 1 CAPSULE (40 MG TOTAL) BY MOUTH BID.  Marland Kitchen ezetimibe (ZETIA) 10 MG tablet Take 1 tablet (10 mg total) by mouth daily.  Marland Kitchen gabapentin (NEURONTIN) 600 MG tablet Take 1 tablet (600 mg total) by mouth 4 (four) times daily.  . Glucose Blood (BLOOD GLUCOSE TEST STRIPS) STRP Please dispense test strips that go with glucometer that patient's insurance will cover. Use to check BG twice daily.  DX:  250.02  . glucose monitoring kit (FREESTYLE) monitoring kit Please dispense whichever glucometer patient's insurance will cover.  Use to check BG up to twice.  DX: 250.02  .  HYDROcodone-acetaminophen (NORCO) 7.5-325 MG per tablet Take 1 tablet by mouth daily as needed.   . Lancets 30G MISC Use to check BG twice daily.  Dx:  250.02  . lisinopril-hydrochlorothiazide (PRINZIDE,ZESTORETIC) 20-25 MG per tablet Take 1 tablet by mouth daily.  . Omega-3 Fatty Acids (FISH OIL) 1000 MG CAPS Take by mouth 2 (two) times daily.    [name] DELICA LANCETS 37S MISC USE ONE  TO CHECK GLUCOSE LEVELS UP TO TWICE DAILY  . Pitavastatin Calcium 2 MG TABS Take 1 tablet (2 mg total) by mouth every other day.  . [DISCONTINUED] sitaGLIPtin-metformin (JANUMET) 50-1000 MG per tablet Take 1 tablet by mouth 2 (two) times daily with a meal. (Patient taking differently: Take 1 tablet by mouth daily. )  . Blood Glucose Monitoring Suppl DEVI Blood glucose meter One Touch Brand. Use to check BS up to BID. DX 250.02  . SitaGLIPtin-MetFORMIN HCl 458 309 3323 MG TB24 Take 1 tablet by mouth daily.  . [DISCONTINUED] Biotin 5000 MCG CAPS Take 5,000 mcg by mouth daily.    No facility-administered encounter medications on file as of 03/11/2015.    Allergies (verified) Codeine; Crestor; Erythromycin; Fenofibrate; Lyrica; Penicillins; Statins; Sulfa antibiotics; and Zocor   History: Past Medical History  Diagnosis Date  . Diabetes mellitus, type 2 ([place]) 06/2010  . Depression   . Hyperlipidemia   . Hypertension   . Hiatal hernia   . Fibromyalgia     [place]   .  Esophageal stricture   . Colon polyps   . Arthritis     [place]   . Menopause   . GERD (gastroesophageal reflux disease)   . Vitamin D deficiency   . Peripheral vascular insufficiency ([place])   . Hemorrhoids   . Diverticulosis   . Cancer [facility])    Past Surgical History  Procedure Laterality Date  . Total knee arthroplasty      bilateral  . Vesicovaginal fistula closure w/ tah  1981  . Refractive surgery  2003  . Elbow surgery      left  . Ankle surgery      right  . Joint replacement Bilateral     knee  . Abdominal  hysterectomy      partial and then total  . Replacement total knee Bilateral    Family History  Problem Relation Age of Onset  . Diabetes Mother   . Heart failure Mother   . Hypertension Mother   . Cirrhosis Father   . Cirrhosis Brother    Social History   Occupational History  . Retired    Social History Main Topics  . Smoking status: Former Smoker -- 1.00 packs/day    Types: Cigarettes    Start date: 11/07/1975    Quit date: 06/24/2010  . Smokeless tobacco: Never Used  . Alcohol Use: No  . Drug Use: No  . Sexual Activity: Yes    Do you feel safe at home?  Yes  Dietary issues and exercise activities: Current Exercise Habits:: The patient does not participate in regular exercise at present;Exercise is limited by, Limited by:: orthopedic condition(s)  Current Dietary habits:  Patient is trying to limit junk food, sweets and other high CHO containing foods.     Objective:    Today's Vitals   03/11/15 0853  BP: 130/70  Pulse: 77  Height: _0  (1.676 m)  Weight: 217 lb (98.431 kg)  PainSc: 0-No pain - patient reports pain in leg and back when standing for extended periods of time.  She sees Dr Nelva Bush for pain management and occasional steroid injections.   Body mass index is 35.04 kg/(m^2).  Activities of Daily Living In your present state of health, do you have any difficulty performing the following activities: 03/11/2015  Hearing? N  Vision? N  Difficulty concentrating or making decisions? N  Walking or climbing stairs? Y  Dressing or bathing? N  Doing errands, shopping? N  Preparing Food and eating ? N  Using the Toilet? N  In the past six months, have you accidently leaked urine? N  Do you have problems with loss of bowel control? N  Managing your Medications? N  Managing your Finances? N  Housekeeping or managing your Housekeeping? N    Are there smokers in your home (other than you)? No   Cardiac Risk Factors include: advanced age (>36mn, >>63 women);diabetes mellitus;dyslipidemia;hypertension;obesity (BMI >30kg/m2);sedentary lifestyle  Depression Screen PHQ 2/9 Scores 03/11/2015 01/14/2015 11/08/2014 08/30/2014  PHQ - 2 Score 0 1 0 0  PHQ- 9 Score - - - -    Fall Risk Fall Risk  03/11/2015 01/14/2015 11/08/2014 08/30/2014 04/17/2014  Falls in the past year? _1     Cognitive Function: MMSE - Mini Mental State Exam 03/11/2015  Orientation to time 5  Orientation to Place 5  Registration 3  Attention/ Calculation 5  Recall 3  Language- name 2 objects 2  Language- repeat 1  Language- follow 3 step command 3  Language- read & follow direction 1  Write a sentence 1  Copy design 1  Total score 30    Immunizations and Health Maintenance Immunization History  Administered Date(s) Administered  . Influenza,inj,Quad PF,36+ Mos 01/30/2013, 01/14/2015  . Influenza,inj,quad, With Preservative 01/04/2014  . Pneumococcal Conjugate-13 02/28/2013  . Pneumococcal Polysaccharide-23 02/05/2011  . Zoster 08/08/2009   Health Maintenance Due  Topic Date Due  . OPHTHALMOLOGY EXAM  02/24/2015    Patient Care Team: Chipper Herb, MD as PCP - General (Family Medicine) Irene Shipper, MD as Consulting Physician (Gastroenterology) Sheralyn Boatman, MD as Consulting Physician (Psychiatry) Suella Broad, MD as Consulting Physician (Physical Medicine and Rehabilitation) Marica Otter, OD as Consulting Physician (Optometry)  Indicate any recent Medical Services you may have received from other than [facility] providers in the past year (date may be approximate).    Assessment:    Annual Wellness Visit  Type 2 DM - last A1c was 7.0% but home BG readings a little high since medication change Medication Management - GI side effects have resolved since Janumet dose change.  Obesity - weight has decreased by 2#   Screening Tests Health Maintenance  Topic Date Due  . OPHTHALMOLOGY EXAM  02/24/2015  . DEXA SCAN  04/07/2015  . HEMOGLOBIN A1C   07/15/2015  . URINE MICROALBUMIN  08/30/2015  . INFLUENZA VACCINE  11/05/2015  . FOOT EXAM  01/14/2016  . COLON CANCER SCREENING ANNUAL FOBT  01/15/2016  . TETANUS/TDAP  04/06/2016  . MAMMOGRAM  10/09/2016  . COLONOSCOPY  11/22/2017  . ZOSTAVAX  Completed  . Hepatitis C Screening  Completed  . PNA vac Low Risk Adult  Completed        Plan:   During the course of the visit Joye was educated and counseled about the following appropriate screening and preventive services:   Vaccines to include Pneumoccal, Influenza, Hepatitis B, Td, Zostavax - Vaccines are UTD  Colorectal cancer screening - Colonoscopy and FOBT are UTD  Cardiovascular disease screening - BP at goal;  We are working on getting LDL to less than 100.  She is tolerating takin glivalo 13m qod and Zetia 148mdaily.    Diabetes - will change to Janumet XR 100/1000mg take 1 tablet daily with food.  (has a higher dose of Januvia without increase metformin or risk of GI side effects)  Microalbumin was done 08/2014 in house and was negative.  Bone Denisty / Osteoporosis Screening - due recheck in 2017.  Order sent today  Mammogram - UTD  PAP - TUD  Glaucoma screening / Diabetic Eye Exam - patient has received reminder cared from Dr SaMarica Otter She is planning to make appt in January 2017.  Nutrition counseling - Discussed continueing to limit portion sizes and high sugar / CHO foods.  Continue as she has lost 2# since beginning of November.   Advanced Directives - UTD, patient will bring copy in future.   Exercise - patient was encouraged to increase physical activity as able.  She is given handout about Chair Exercises or consider water aerobics / swimming.     Patient Instructions (the written plan) were given to the patient.   Ec[facility]re RobinsPHLif[facility] 03/11/2015

## 2015-04-02 ENCOUNTER — Other Ambulatory Visit: Payer: Self-pay | Admitting: *Deleted

## 2015-04-02 MED ORDER — ALPRAZOLAM 0.5 MG PO TABS: 0.5000 mg | ORAL_TABLET | Freq: Every evening | ORAL | Status: DC | PRN

## 2015-04-02 NOTE — Telephone Encounter (Signed)
Last filled 08/30/14 with 1 RF, last seen 01/14/15. Call in at [place]

## 2015-04-04 ENCOUNTER — Other Ambulatory Visit: Payer: Self-pay | Admitting: *Deleted

## 2015-04-04 MED ORDER — ESTROGENS, CONJUGATED 0.625 MG/GM VA CREA: 1.0000 | TOPICAL_CREAM | Freq: Every evening | VAGINAL | Status: DC | PRN

## 2015-04-10 ENCOUNTER — Telehealth: Payer: Self-pay | Admitting: Family Medicine

## 2015-04-10 MED ORDER — SULFAMETHOXAZOLE-TRIMETHOPRIM 800-160 MG PO TABS: 1.0000 | ORAL_TABLET | Freq: Two times a day (BID) | ORAL | Status: DC

## 2015-04-10 NOTE — Telephone Encounter (Signed)
Had recent urinary infection  - she has always had a antibiotic on hand - per [facility]

## 2015-04-15 ENCOUNTER — Ambulatory Visit: Payer: Medicare Other | Admitting: Internal Medicine

## 2015-04-17 ENCOUNTER — Ambulatory Visit (INDEPENDENT_AMBULATORY_CARE_PROVIDER_SITE_OTHER): Payer: Medicare Other | Admitting: Internal Medicine

## 2015-04-17 ENCOUNTER — Encounter: Payer: Self-pay | Admitting: Internal Medicine

## 2015-04-17 VITALS — BP 96/60 | HR 84 | Ht 66.0 in | Wt 219.4 lb

## 2015-04-17 DIAGNOSIS — K219 Gastro-esophageal reflux disease without esophagitis: Secondary | ICD-10-CM

## 2015-04-17 DIAGNOSIS — K760 Fatty (change of) liver, not elsewhere classified: Secondary | ICD-10-CM | POA: Diagnosis not present

## 2015-04-17 DIAGNOSIS — R93429 Abnormal radiologic findings on diagnostic imaging of unspecified kidney: Secondary | ICD-10-CM

## 2015-04-17 NOTE — Patient Instructions (Signed)
Follow up as needed

## 2015-04-17 NOTE — Progress Notes (Signed)
HISTORY OF PRESENT ILLNESS:  [name] is a 70 y.o. female with a history of GERD complicated by peptic stricture requiring esophageal dilation, irritable bowel syndrome, fibromyalgia, anxiety/depression, chronic chest pain, hemorrhoids, hypertension, and arthritis. She was evaluated 02/07/2015 regarding change in bowel habits with urgent loose stools, GERD with occasional breakthrough, and elevated hepatic transaminases. Her issues with bowel habit changes were felt possibly secondary to Janumet. With decreased dosage, her bowel issues resolve. Next, strict attention to reflux precautions for GERD recommended. This has helped. Finally, extensive workup for elevated liver tests including blood work and imaging. Blood work negative for causes other than fatty liver. Imaging supports fatty liver. She did have an incidental kidney lesion on ultrasound for which MRI was recommended and performed. The lesion was felt to be benign with no particular follow-up recommended. Patient has no new complaints today. She does have several questions  REVIEW OF SYSTEMS:  All non-GI ROS negative except for muscle cramps, increased thirst, increased urination, fatigue  Past Medical History  Diagnosis Date  . Diabetes mellitus, type 2 ([place]) 06/2010  . Depression   . Hyperlipidemia   . Hypertension   . Hiatal hernia   . Fibromyalgia     [place]   . Esophageal stricture   . Colon polyps   . Arthritis     [place]   . Menopause   . GERD (gastroesophageal reflux disease)   . Vitamin D deficiency   . Peripheral vascular insufficiency ([place])   . Hemorrhoids   . Diverticulosis   . Cancer [facility])     Past Surgical History  Procedure Laterality Date  . Total knee arthroplasty      bilateral  . Vesicovaginal fistula closure w/ tah  1981  . Refractive surgery  2003  . Elbow surgery      left  . Ankle surgery      right  . Joint replacement Bilateral     knee  . Abdominal hysterectomy      partial  and then total  . Replacement total knee Bilateral     Social History GEORGENE Haas  reports that she quit smoking about 4 years ago. Her smoking use included Cigarettes. She started smoking about 39 years ago. She smoked 1.00 pack per day. She has never used smokeless tobacco. She reports that she does not drink alcohol or use illicit drugs.  family history includes Cirrhosis in her brother and father; Diabetes in her mother; Heart failure in her mother; Hypertension in her mother.  Allergies  Allergen Reactions  . Codeine Nausea And Vomiting  . Crestor [Rosuvastatin Calcium]     Joints ache  . Erythromycin Nausea And Vomiting  . Fenofibrate     aching & edema    . Lyrica [Pregabalin]     Edema in feet   . Penicillins     Blisters in mouth  . Statins     Joints ache  . Sulfa Antibiotics Nausea And Vomiting  . Zocor [Simvastatin]     Joints ache       PHYSICAL EXAMINATION: Vital signs: BP 96/60 mmHg  Pulse 84  Ht 5\' 6"  (1.676 m)  Wt 219 lb 6 oz (99.508 kg)  BMI 35.43 kg/m2 General: Well-developed, well-nourished, no acute distress HEENT: Sclerae are anicteric, conjunctiva pink. Oral mucosa intact Lungs: Clear Heart: Regular Abdomen: soft, obese, nontender, nondistended, no obvious ascites, no peritoneal signs, normal bowel sounds. No organomegaly. Extremities: No clubbing cyanosis or edema Psychiatric: alert and oriented x3.  Cooperative   ASSESSMENT:  #1. Recent problems with change in bowel habits secondary to Janumet. Improved with adjusted dosage #2. GERD. Ongoing #3. Abnormal hepatic transaminases secondary to fatty liver #4. Possible abnormality of kidney on ultrasound clarified and found to be benign on MRI #5. History of adenomatous colon polyps   PLAN:  #1. Extensive discussion today on the importance of fatty liver and the potential to develop hepatic cirrhosis. Her father had cirrhosis, though he barely consuming considerable amounts of alcohol  regularly #2. Exercise and weight loss  #3. Surveillance colonoscopy around October 2019. Interval follow-up as needed  Approximately 25 minutes was spent face-to-face with the patient. Greater than 50% of the time use for reviewing her x-rays, laboratories, and counseling regarding fatty liver disease

## 2015-04-24 ENCOUNTER — Other Ambulatory Visit: Payer: Self-pay

## 2015-04-24 MED ORDER — ARIPIPRAZOLE 2 MG PO TABS: 2.0000 mg | ORAL_TABLET | Freq: Every day | ORAL | Status: DC

## 2015-04-24 NOTE — Telephone Encounter (Signed)
Last seen 01/14/15 DWM

## 2015-05-02 ENCOUNTER — Other Ambulatory Visit: Payer: Medicare Other

## 2015-05-02 ENCOUNTER — Ambulatory Visit (INDEPENDENT_AMBULATORY_CARE_PROVIDER_SITE_OTHER): Payer: Medicare Other

## 2015-05-02 DIAGNOSIS — M899 Disorder of bone, unspecified: Secondary | ICD-10-CM | POA: Diagnosis not present

## 2015-05-16 ENCOUNTER — Encounter: Payer: Self-pay | Admitting: *Deleted

## 2015-05-30 DIAGNOSIS — L03031 Cellulitis of right toe: Secondary | ICD-10-CM | POA: Diagnosis not present

## 2015-05-30 DIAGNOSIS — M79674 Pain in right toe(s): Secondary | ICD-10-CM | POA: Diagnosis not present

## 2015-05-31 ENCOUNTER — Other Ambulatory Visit: Payer: Self-pay | Admitting: Pharmacist

## 2015-05-31 DIAGNOSIS — E119 Type 2 diabetes mellitus without complications: Secondary | ICD-10-CM | POA: Diagnosis not present

## 2015-05-31 MED ORDER — ONETOUCH DELICA LANCETS 33G MISC: Status: DC

## 2015-05-31 MED ORDER — BLOOD GLUCOSE TEST VI STRP: ORAL_STRIP | Status: DC

## 2015-06-05 ENCOUNTER — Ambulatory Visit (INDEPENDENT_AMBULATORY_CARE_PROVIDER_SITE_OTHER): Payer: Medicare Other | Admitting: Family Medicine

## 2015-06-05 ENCOUNTER — Encounter: Payer: Self-pay | Admitting: Family Medicine

## 2015-06-05 VITALS — BP 125/78 | HR 82 | Temp 97.6°F | Ht 66.0 in | Wt 218.0 lb

## 2015-06-05 DIAGNOSIS — K219 Gastro-esophageal reflux disease without esophagitis: Secondary | ICD-10-CM | POA: Diagnosis not present

## 2015-06-05 DIAGNOSIS — M5137 Other intervertebral disc degeneration, lumbosacral region: Secondary | ICD-10-CM

## 2015-06-05 DIAGNOSIS — E119 Type 2 diabetes mellitus without complications: Secondary | ICD-10-CM

## 2015-06-05 DIAGNOSIS — E11610 Type 2 diabetes mellitus with diabetic neuropathic arthropathy: Secondary | ICD-10-CM | POA: Diagnosis not present

## 2015-06-05 DIAGNOSIS — E1169 Type 2 diabetes mellitus with other specified complication: Secondary | ICD-10-CM | POA: Diagnosis not present

## 2015-06-05 DIAGNOSIS — E785 Hyperlipidemia, unspecified: Secondary | ICD-10-CM | POA: Diagnosis not present

## 2015-06-05 DIAGNOSIS — M797 Fibromyalgia: Secondary | ICD-10-CM

## 2015-06-05 DIAGNOSIS — I10 Essential (primary) hypertension: Secondary | ICD-10-CM | POA: Diagnosis not present

## 2015-06-05 DIAGNOSIS — E559 Vitamin D deficiency, unspecified: Secondary | ICD-10-CM | POA: Diagnosis not present

## 2015-06-05 LAB — POCT GLYCOSYLATED HEMOGLOBIN (HGB A1C): Hemoglobin A1C: 14

## 2015-06-05 LAB — GLUCOSE, POCT (MANUAL RESULT ENTRY): POC Glucose: 230 mg/dl — AB (ref 70–99)

## 2015-06-05 NOTE — Progress Notes (Signed)
Subjective:    [name], female    DOB: [birth year], 70 y.o.   MRN: [id number]  HPI Pt here for follow up and management of chronic medical problems which includes diabetes, hyperlipidemia and hypertension. She is taking medications regularly. The patient today complains about fatigue and elevated blood sugars. She will get lab work done today. She brings in blood sugars for review and these are running anywhere from an average of 150-200 in the morning and over 200 in the bedtime checks. This record will be scanned into her record. Patient denies any chest pain. She does have occasional shortness of breath. She shops and this is her activity. She does have to rest periodically because of her back pain. She does not have any heartburn but she attributes this to taking her proton pump inhibitor. She denies abdominal pain nausea vomiting diarrhea blood in the stool or black tarry bowel movements. She recently had an abdominal MRI and everything was good with this. She is passing her water without problems. She sees the orthopedic doctor regularly because of her chronic degenerative disc disease in her back and gets periodic injections for this.     Patient Active Problem List   Diagnosis Date Noted  . 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 Depression 02/28/2013  . GERD (gastroesophageal reflux disease) 02/28/2013  . Hiatal hernia with gastroesophageal reflux 02/28/2013  . Peripheral neuropathy ([place]) 02/28/2013  . DDD (degenerative disc disease), lumbosacral 06/23/2012  . Fibromyalgia syndrome 06/23/2012  . Essential hypertension, benign 06/23/2012  . Osteopenia 06/23/2012  . Type II or unspecified type diabetes mellitus without mention of complication, not stated as uncontrolled 06/23/2012  . Abnormal EKG 10/29/2010  . Obesity due to excess calories 10/29/2010  . 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 DYSPHAGIA 10/11/2009  . PERSONAL HISTORY OF COLONIC  POLYPS 10/11/2009   Outpatient Encounter Prescriptions as of 06/05/2015  Medication Sig  . ALPRAZolam (XANAX) 0.5 MG tablet Take 1 tablet (0.5 mg total) by mouth at bedtime as needed.  . ARIPiprazole (ABILIFY) 2 MG tablet Take 1 tablet (2 mg total) by mouth daily.  . Blood Glucose Monitoring Suppl DEVI Blood glucose meter One Touch Brand. Use to check BS up to BID. DX 250.02  . cholecalciferol (VITAMIN D) 1000 UNITS tablet Take 2,000 Units by mouth daily.   Marland Kitchen conjugated estrogens (PREMARIN) vaginal cream Place 1 Applicatorful vaginally at bedtime as needed. Use q hs  . escitalopram (LEXAPRO) 20 MG tablet Take 1 tablet (20 mg total) by mouth daily.  Marland Kitchen esomeprazole (NEXIUM) 40 MG capsule TAKE 1 CAPSULE (40 MG TOTAL) BY MOUTH BID.  Marland Kitchen ezetimibe (ZETIA) 10 MG tablet Take 1 tablet (10 mg total) by mouth daily.  Marland Kitchen gabapentin (NEURONTIN) 600 MG tablet Take 1 tablet (600 mg total) by mouth 4 (four) times daily.  . Glucose Blood (BLOOD GLUCOSE TEST STRIPS) STRP Use to check BG twice daily.  DX:  E11.9  . glucose monitoring kit (FREESTYLE) monitoring kit Please dispense whichever glucometer patient's insurance will cover.  Use to check BG up to twice.  DX: 250.02  . HYDROcodone-acetaminophen (NORCO) 7.5-325 MG per tablet Take 1 tablet by mouth daily as needed.   . Lancets 30G MISC Use to check BG twice daily.  Dx:  250.02  . lisinopril-hydrochlorothiazide (PRINZIDE,ZESTORETIC) 20-25 MG per tablet Take 1 tablet by mouth daily.  . Omega-3 Fatty Acids (FISH OIL) 1000 MG CAPS Take by mouth 2 (two) times daily.    Marland Kitchen  ONETOUCH DELICA LANCETS 16X MISC Use to check BG up to BID.  DX: E11.9  . Pitavastatin Calcium 2 MG TABS Take 1 tablet (2 mg total) by mouth every other day.  . SitaGLIPtin-MetFORMIN HCl [phone] MG TB24 Take 1 tablet by mouth daily.  . [DISCONTINUED] JANUMET 50-1000 MG tablet Take 1 tablet by mouth daily.  . [DISCONTINUED] sulfamethoxazole-trimethoprim (BACTRIM DS,SEPTRA DS) 800-160 MG tablet Take  1 tablet by mouth 2 (two) times daily.   No facility-administered encounter medications on file as of 06/05/2015.      Review of Systems  Constitutional: Positive for fatigue.  HENT: Negative.   Eyes: Negative.   Respiratory: Negative.   Cardiovascular: Negative.   Gastrointestinal: Negative.   Endocrine: Negative.        Elevated BS  Genitourinary: Negative.   Musculoskeletal: Negative.   Skin: Negative.   Allergic/Immunologic: Negative.   Neurological: Negative.   Hematological: Negative.   Psychiatric/Behavioral: Negative.        Objective:   Physical Exam  Constitutional: She is oriented to person, place, and time. She appears well-developed and well-nourished. No distress.  Pleasant and alert  HENT:  Head: Normocephalic and atraumatic.  Right Ear: External ear normal.  Left Ear: External ear normal.  Nose: Nose normal.  Mouth/Throat: Oropharynx is clear and moist.  Eyes: Conjunctivae and EOM are normal. Pupils are equal, round, and reactive to light. Right eye exhibits no discharge. Left eye exhibits no discharge. No scleral icterus.  Neck: Normal range of motion. Neck supple. No thyromegaly present.  No carotid bruits thyromegaly or anterior cervical adenopathy  Cardiovascular: Normal rate, regular rhythm, normal heart sounds and intact distal pulses.   No murmur heard. The heart is regular at 60/m  Pulmonary/Chest: Effort normal and breath sounds normal. No respiratory distress. She has no wheezes. She has no rales. She exhibits no tenderness.  Clear anteriorly and posteriorly  Abdominal: Soft. Bowel sounds are normal. She exhibits no mass. There is no tenderness. There is no rebound and no guarding.  Slight epigastric tenderness without liver or spleen enlargement and no bruits no inguinal adenopathy  Musculoskeletal: Normal range of motion. She exhibits no edema.  The patient has fairly good mobility today although hesitant in her movement of getting on the table  and getting off etc.  Lymphadenopathy:    She has no cervical adenopathy.  Neurological: She is alert and oriented to person, place, and time. She has normal reflexes. No cranial nerve deficit.  Skin: Skin is warm and dry. No rash noted.  Psychiatric: She has a normal mood and affect. Her behavior is normal. Judgment and thought content normal.  Nursing note and vitals reviewed.   BP 125/78 mmHg  Pulse 82  Temp(Src) 97.6 F (36.4 C) (Oral)  Ht 5' 6"  (1.676 m)  Wt 218 lb (98.884 kg)  BMI 35.20 kg/m2       Assessment & Plan:  1. Hyperlipidemia associated with type 2 diabetes mellitus ([place]) -Continue current treatment and therapeutic lifestyle changes pending results of lab work  2. Vitamin D deficiency -Continue current treatment pending results of lab work  3. Essential hypertension, benign -The blood pressure is good today and she will continue with current treatment  4. Type 2 diabetes mellitus without complication, without long-term current use of insulin ([place]) -Continue aggressive therapeutic lifestyle changes. Blood sugars have been more elevated recently that she brings in for review. We will schedule her for a visit with the clinical pharmacist to discuss further options  of getting better blood sugar control once the hemoglobin A1c is returned. She will bring readings from home at that time.  5. Gastroesophageal reflux disease, esophagitis presence not specified -She will continue her proton pump inhibitor  6. Fibromyalgia syndrome -Continue medications as needed for her fibromyalgia pain.  7. 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  8. DDD (degenerative disc disease), lumbosacral -Continue to follow-up with orthopedist  Patient Instructions                       Medicare Annual Wellness Visit  [facility] and the medical providers at [facility] strive to bring you the best medical care.  In doing so we not only want to address your current medical conditions and concerns but also to detect new conditions early and prevent illness, disease and health-related problems.    Medicare offers a yearly Wellness Visit which allows our clinical staff to assess your need for preventative services including immunizations, lifestyle education, counseling to decrease risk of preventable diseases and screening for fall risk and other medical concerns.    This visit is provided free of charge (no copay) for all Medicare recipients. The clinical pharmacists at [facility] have begun to conduct these Wellness Visits which will also include a thorough review of all your medications.    As you primary medical provider recommend that you make an appointment for your Annual Wellness Visit if you have not done so already this year.  You may set up this appointment before you leave today or you may call back [phone]) and schedule an appointment.  Please make sure when you call that you mention that you are scheduling your Annual Wellness Visit with the clinical pharmacist so that the appointment may be made for the proper length of time.     Continue current medications. Continue good therapeutic lifestyle changes which include good diet and exercise. Fall precautions discussed with patient. If an FOBT was given today- please return it to our front desk. If you are over 64 years old - you may need Prevnar 80 or the adult Pneumonia vaccine.  **Flu shots are available--- please call and schedule a FLU-CLINIC appointment**  After your visit with [place] today you will receive a survey in the mail or online from [employer] regarding your care with [place]. Please take a moment to fill this out. Your feedback is very important to [place] as you can help [place] better understand your patient needs as well as improve your experience and satisfaction. WE  CARE ABOUT YOU!!!   The patient should continue with as much activity as she can tolerate stress he walking. She should watch her diet as closely as possible reducing her caloric intake and drinking more water She should follow-up with orthopedist as planned She should check her blood sugars more frequently over the next couple weeks and we will give her an appointment for follow-up with the clinical pharmacist to review her blood sugars and make a decision about more aggressive treatment than what she is currently taking    Arrie Senate MD

## 2015-06-05 NOTE — Addendum Note (Signed)
Addended by: Zannie Cove on: 06/05/2015 03:36 PM   Modules accepted: Orders

## 2015-06-05 NOTE — Patient Instructions (Addendum)
Medicare Annual Wellness Visit  [facility] and the medical providers at [facility] strive to bring you the best medical care.  In doing so we not only want to address your current medical conditions and concerns but also to detect new conditions early and prevent illness, disease and health-related problems.    Medicare offers a yearly Wellness Visit which allows our clinical staff to assess your need for preventative services including immunizations, lifestyle education, counseling to decrease risk of preventable diseases and screening for fall risk and other medical concerns.    This visit is provided free of charge (no copay) for all Medicare recipients. The clinical pharmacists at [facility] have begun to conduct these Wellness Visits which will also include a thorough review of all your medications.    As you primary medical provider recommend that you make an appointment for your Annual Wellness Visit if you have not done so already this year.  You may set up this appointment before you leave today or you may call back [phone] and schedule an appointment.  Please make sure when you call that you mention that you are scheduling your Annual Wellness Visit with the clinical pharmacist so that the appointment may be made for the proper length of time.     Continue current medications. Continue good therapeutic lifestyle changes which include good diet and exercise. Fall precautions discussed with patient. If an FOBT was given today- please return it to our front desk. If you are over 67 years old - you may need Prevnar 57 or the adult Pneumonia vaccine.  **Flu shots are available--- please call and schedule a FLU-CLINIC appointment**  After your visit with [place] today you will receive a survey in the mail or online from [employer] regarding your care with [place]. Please take a moment to fill this out. Your feedback is very  important to [place] as you can help [place] better understand your patient needs as well as improve your experience and satisfaction. WE CARE ABOUT YOU!!!   The patient should continue with as much activity as she can tolerate stress he walking. She should watch her diet as closely as possible reducing her caloric intake and drinking more water She should follow-up with orthopedist as planned She should check her blood sugars more frequently over the next couple weeks and we will give her an appointment for follow-up with the clinical pharmacist to review her blood sugars and make a decision about more aggressive treatment than what she is currently taking

## 2015-06-05 NOTE — Addendum Note (Signed)
Addended by: Selmer Dominion on: 06/05/2015 04:22 PM   Modules accepted: Orders

## 2015-06-06 DIAGNOSIS — H5211 Myopia, right eye: Secondary | ICD-10-CM | POA: Diagnosis not present

## 2015-06-06 DIAGNOSIS — Z7984 Long term (current) use of oral hypoglycemic drugs: Secondary | ICD-10-CM | POA: Diagnosis not present

## 2015-06-06 DIAGNOSIS — E119 Type 2 diabetes mellitus without complications: Secondary | ICD-10-CM | POA: Diagnosis not present

## 2015-06-06 DIAGNOSIS — H04123 Dry eye syndrome of bilateral lacrimal glands: Secondary | ICD-10-CM | POA: Diagnosis not present

## 2015-06-06 DIAGNOSIS — H52223 Regular astigmatism, bilateral: Secondary | ICD-10-CM | POA: Diagnosis not present

## 2015-06-06 DIAGNOSIS — H1789 Other corneal scars and opacities: Secondary | ICD-10-CM | POA: Diagnosis not present

## 2015-06-06 DIAGNOSIS — H524 Presbyopia: Secondary | ICD-10-CM | POA: Diagnosis not present

## 2015-06-06 DIAGNOSIS — H25813 Combined forms of age-related cataract, bilateral: Secondary | ICD-10-CM | POA: Diagnosis not present

## 2015-06-06 DIAGNOSIS — H5202 Hypermetropia, left eye: Secondary | ICD-10-CM | POA: Diagnosis not present

## 2015-06-06 LAB — BMP8+EGFR
BUN/Creatinine Ratio: 10 — ABNORMAL LOW (ref 11–26)
BUN: 8 mg/dL (ref 8–27)
CALCIUM: 9.6 mg/dL (ref 8.7–10.3)
CHLORIDE: 93 mmol/L — AB (ref 96–106)
CO2: 27 mmol/L (ref 18–29)
CREATININE: 0.81 mg/dL (ref 0.57–1.00)
GFR calc non Af Amer: 74 mL/min/{1.73_m2} (ref >59)
GFR, EST AFRICAN AMERICAN: 86 mL/min/{1.73_m2} (ref >59)
Glucose: 236 mg/dL — ABNORMAL HIGH (ref 65–99)
Potassium: 5.3 mmol/L — ABNORMAL HIGH (ref 3.5–5.2)
Sodium: 136 mmol/L (ref 134–144)

## 2015-06-06 LAB — CBC WITH DIFFERENTIAL/PLATELET
BASOS ABS: 0.1 10*3/uL (ref 0.0–0.2)
Basos: 1 %
EOS (ABSOLUTE): 0.1 10*3/uL (ref 0.0–0.4)
Eos: 2 %
Hematocrit: 38.4 % (ref 34.0–46.6)
Hemoglobin: 12.4 g/dL (ref 11.1–15.9)
IMMATURE GRANULOCYTES: 0 %
Immature Grans (Abs): 0 10*3/uL (ref 0.0–0.1)
LYMPHS: 36 %
Lymphocytes Absolute: 2.2 10*3/uL (ref 0.7–3.1)
MCH: 27 pg (ref 26.6–33.0)
MCHC: 32.3 g/dL (ref 31.5–35.7)
MCV: 84 fL (ref 79–97)
MONOS ABS: 0.8 10*3/uL (ref 0.1–0.9)
Monocytes: 13 %
NEUTROS PCT: 48 %
Neutrophils Absolute: 3 10*3/uL (ref 1.4–7.0)
PLATELETS: 232 10*3/uL (ref 150–379)
RBC: 4.59 x10E6/uL (ref 3.77–5.28)
RDW: 15.1 % (ref 12.3–15.4)
WBC: 6.2 10*3/uL (ref 3.4–10.8)

## 2015-06-06 LAB — VITAMIN D 25 HYDROXY (VIT D DEFICIENCY, FRACTURES): VIT D 25 HYDROXY: 38 ng/mL (ref 30.0–100.0)

## 2015-06-06 LAB — NMR, LIPOPROFILE
CHOLESTEROL: 173 mg/dL (ref 100–199)
HDL CHOLESTEROL BY NMR: 44 mg/dL (ref >39)
HDL Particle Number: 31.4 umol/L (ref >=30.5)
LDL Particle Number: 1316 nmol/L — ABNORMAL HIGH (ref <1000)
LDL Size: 20.4 nm (ref >20.5)
LDL-C: 82 mg/dL (ref 0–99)
LP-IR Score: 76 — ABNORMAL HIGH (ref <=45)
SMALL LDL PARTICLE NUMBER: 634 nmol/L — AB (ref <=527)
TRIGLYCERIDES BY NMR: 234 mg/dL — AB (ref 0–149)

## 2015-06-06 LAB — HEPATIC FUNCTION PANEL
ALBUMIN: 4.1 g/dL (ref 3.6–4.8)
ALT: 53 IU/L — AB (ref 0–32)
AST: 62 IU/L — ABNORMAL HIGH (ref 0–40)
Alkaline Phosphatase: 84 IU/L (ref 39–117)
BILIRUBIN TOTAL: 0.3 mg/dL (ref 0.0–1.2)
Bilirubin, Direct: 0.11 mg/dL (ref 0.00–0.40)
TOTAL PROTEIN: 6.9 g/dL (ref 6.0–8.5)

## 2015-06-06 LAB — HM DIABETES EYE EXAM

## 2015-06-07 ENCOUNTER — Telehealth: Payer: Self-pay | Admitting: *Deleted

## 2015-06-07 ENCOUNTER — Ambulatory Visit (INDEPENDENT_AMBULATORY_CARE_PROVIDER_SITE_OTHER): Payer: Medicare Other | Admitting: Pediatrics

## 2015-06-07 ENCOUNTER — Encounter: Payer: Self-pay | Admitting: Pediatrics

## 2015-06-07 VITALS — BP 142/79 | HR 93 | Temp 97.1°F | Ht 66.0 in | Wt 220.4 lb

## 2015-06-07 DIAGNOSIS — E119 Type 2 diabetes mellitus without complications: Secondary | ICD-10-CM

## 2015-06-07 MED ORDER — CANAGLIFLOZIN 100 MG PO TABS: 100.0000 mg | ORAL_TABLET | Freq: Every day | ORAL | Status: DC

## 2015-06-07 MED ORDER — METFORMIN HCL ER 500 MG PO TB24: 500.0000 mg | ORAL_TABLET | Freq: Every day | ORAL | Status: DC

## 2015-06-07 MED ORDER — SITAGLIPTIN PHOSPHATE 100 MG PO TABS: 100.0000 mg | ORAL_TABLET | Freq: Every day | ORAL | Status: DC

## 2015-06-07 NOTE — Progress Notes (Signed)
Subjective:    [name], female    DOB: 08-Nov-1945, 70 y.o.   MRN: [id number]  CC: Elevated A1c   HPI: [name] is a 70 y.o. female presenting for Elevated A1c  Seen two days ago by PCP, found to have elevated HgA1c, from 7 four months ago to 14 yesterday.  She has appt to f/u with Tammy next week but wanted to be seen by someone today No symptoms of hyperglycemia Feeling well, normal urination  On janumet daily XR--recently started 30 days ago Before that taking BID janumet but having diarrhea Before when on janumet BID BGLs were 120-170, only occasionally up to 170s. Drinks only water and crystal light Denies any major changes to diet though is taking garcenia and vinegar water for weight loss. Janumet XR still gave her diarrhea, not clear if she was taking it regularly or as she was supposed to.  No recent steroids or steroid injections Did have recent oral infection, on keflex but has been recent change. No other infections.   Relevant past medical, surgical, family and social history reviewed and updated as indicated. Interim medical history since our last visit reviewed. Allergies and medications reviewed and updated.    ROS: Per HPI unless specifically indicated above  History  Smoking status  . Former Smoker -- 1.00 packs/day  . Types: Cigarettes  . Start date: 11/07/1975  . Quit date: 06/24/2010  Smokeless tobacco  . Never Used    Past Medical History Patient Active Problem List   Diagnosis Date Noted  . 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 Depression 02/28/2013  . GERD (gastroesophageal reflux disease) 02/28/2013  . Hiatal hernia with gastroesophageal reflux 02/28/2013  . Peripheral neuropathy ([place]) 02/28/2013  . DDD (degenerative disc disease), lumbosacral 06/23/2012  . Fibromyalgia syndrome 06/23/2012  . Essential hypertension, benign 06/23/2012  . Osteopenia 06/23/2012  . Diabetes ([place]) 06/23/2012  .  Abnormal EKG 10/29/2010  . Obesity due to excess calories 10/29/2010  . 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 DYSPHAGIA 10/11/2009  . PERSONAL HISTORY OF COLONIC POLYPS 10/11/2009        Objective:    BP 142/79 mmHg  Pulse 93  Temp(Src) 97.1 F (36.2 C) (Oral)  Ht 5\' 6"  (1.676 m)  Wt 220 lb 6.4 oz (99.973 kg)  BMI 35.59 kg/m2  Wt Readings from Last 3 Encounters:  06/07/15 220 lb 6.4 oz (99.973 kg)  06/05/15 218 lb (98.884 kg)  04/17/15 219 lb 6 oz (99.508 kg)     Gen: NAD, alert, cooperative with exam, NCAT EYES: EOMI, no scleral injection or icterus ENT:   OP without erythema CV: WWP Resp: normal WOB Ext: No edema, warm Neuro: Alert and oriented, strength equal b/l UE and LE, coordination grossly normal MSK: normal muscle bulk     Assessment & Plan:    Neftali was seen today for elevated a1c. I changed the combination pill to [place] and metformin separated as pt tended to decrease or skip doses when on combination pill due to metformin s/e. Previously well controlled on januvia and metformin alone, not clear why HgA1c so elevated now. Will add invokana. Pt to keep appt with Tammy next week. Keeping track of BGLs at home, if they remain 300-400s next 24-48 hrs will call me, will start insulin if needed. BMP two days ago otherwise with normal CO2.  Diagnoses and all orders for this visit:  Type 2 diabetes mellitus without complication, without long-term current use of  insulin ([facility]) -     sitaGLIPtin (JANUVIA) 100 MG tablet; Take 1 tablet (100 mg total) by mouth daily. -     metFORMIN (GLUCOPHAGE-XR) 500 MG 24 hr tablet; Take 1 tablet (500 mg total) by mouth daily with breakfast. -     canagliflozin (INVOKANA) 100 MG TABS tablet; Take 1 tablet (100 mg total) by mouth daily before breakfast.    Follow up plan: Return in about 2 weeks (around 06/21/2015) for F/u with Tammy next week.  Assunta Found, MD [facility] 06/07/2015, 5:22  PM

## 2015-06-07 NOTE — Telephone Encounter (Signed)
-----   Message from Chipper Herb, MD sent at 06/06/2015  7:50 PM EST ----- I have already discussed the results of the hemoglobin A1c and as a result of it being so high she should be seen by the clinical pharmacists as soon as possible to initiate more aggressive management of her diabetes. The blood sugar is elevated at 236. The creatinine, the most important kidney function test is within normal limits. The potassium is slightly elevated by 1/10 of a point and the chloride is slightly decreased. The patient should have her BMP repeated in a couple weeks nonfasting+++++ The CBC has a normal white blood cell count. The hemoglobin is good at 12.4 and the platelet count is adequate. 2 liver function tests remain elevated as they have been in the past but they are not as high as they were 4 months ago and we will continue to monitor these. Cholesterol numbers with advanced lipid testing have a total LDL particle number that is elevated at 1316. Previously this number was 1919. The LDL C is good at 82 and previously was 123 and this is improved. The triglycerides are elevated at 234 and the reason for this would be diet and blood sugar. The good cholesterol or the HDL particle number is within normal limits. Getting the blood sugar under control will definitely help her triglycerides. She should keep the appointment with the clinical pharmacist to work on better diabetes control . She should continue with her Livalo and zetia and with as aggressive therapeutic lifestyle changes as possible which include diet and exercise The vitamin D level is within normal limits at 38.0 and she should continue with current treatment

## 2015-06-07 NOTE — Patient Instructions (Signed)
Check blood sugars before meals as you have been Write down numbers, bring back to clinic

## 2015-06-13 DIAGNOSIS — L03031 Cellulitis of right toe: Secondary | ICD-10-CM | POA: Diagnosis not present

## 2015-06-19 ENCOUNTER — Ambulatory Visit: Payer: Self-pay | Admitting: Pharmacist

## 2015-06-21 ENCOUNTER — Ambulatory Visit (INDEPENDENT_AMBULATORY_CARE_PROVIDER_SITE_OTHER): Payer: Medicare Other | Admitting: Pharmacist

## 2015-06-21 ENCOUNTER — Encounter: Payer: Self-pay | Admitting: Pharmacist

## 2015-06-21 VITALS — BP 106/64 | HR 80 | Ht 66.0 in | Wt 215.0 lb

## 2015-06-21 DIAGNOSIS — I10 Essential (primary) hypertension: Secondary | ICD-10-CM

## 2015-06-21 DIAGNOSIS — E669 Obesity, unspecified: Secondary | ICD-10-CM | POA: Diagnosis not present

## 2015-06-21 DIAGNOSIS — E119 Type 2 diabetes mellitus without complications: Secondary | ICD-10-CM | POA: Diagnosis not present

## 2015-06-21 NOTE — Progress Notes (Signed)
[name], female   DOB: 1945-04-11, 70 y.o.   MRN: [id number]   Subjective:   [name] is a 70 y.o. married, white female who presents for follow up uncontrolled DM, fatty liver / elevated LFTs and elevated triglycerides.  Patient saw her PCP about 2 weeks ago and A1c was 14% (previous A1c has 7.0% 01/2015).  She was started on Invokana 111m daily and has tolerated well.  She is also taking metformin XR 5053mdaily (was unable to tolerate higher doses due to GI distress) and Januvia 10074maily.  Patient is checking BG up to qid.  Readings since starting Invokana 100m68m range from low 200's to 129.  RBG in office today was 113.  There is a trend that BG is improving over the last 2 weeks and last 5 days has not been over 200.  Diet - patient has made significant changes to diet.  She is following an "Atkins type" diet - no bread, potatoes or corn.  She has increased non starchy vegetables.   Exercise is limiting by back pain.  Current Medications (verified) Outpatient Encounter Prescriptions as of 06/21/2015  Medication Sig  . ALPRAZolam (XANAX) 0.5 MG tablet Take 1 tablet (0.5 mg total) by mouth at bedtime as needed.  . ARIPiprazole (ABILIFY) 2 MG tablet Take 1 tablet (2 mg total) by mouth daily.  . Blood Glucose Monitoring Suppl DEVI Blood glucose meter One Touch Brand. Use to check BS up to BID. DX 250.02  . canagliflozin (INVOKANA) 100 MG TABS tablet Take 1 tablet (100 mg total) by mouth daily before breakfast.  . cholecalciferol (VITAMIN D) 1000 UNITS tablet Take 2,000 Units by mouth daily.   . coMarland Kitchenjugated estrogens (PREMARIN) vaginal cream Place 1 Applicatorful vaginally at bedtime as needed. Use q hs  . escitalopram (LEXAPRO) 20 MG tablet Take 1 tablet (20 mg total) by mouth daily.  . esMarland Kitchenmeprazole (NEXIUM) 40 MG capsule TAKE 1 CAPSULE (40 MG TOTAL) BY MOUTH BID.  . ezMarland Kitchentimibe (ZETIA) 10 MG tablet Take 1 tablet (10 mg total) by mouth daily.  . gaMarland Kitchenapentin (NEURONTIN)  600 MG tablet Take 1 tablet (600 mg total) by mouth 4 (four) times daily.  . Glucose Blood (BLOOD GLUCOSE TEST STRIPS) STRP Use to check BG twice daily.  DX:  E11.9  . glucose monitoring kit (FREESTYLE) monitoring kit Please dispense whichever glucometer patient's insurance will cover.  Use to check BG up to twice.  DX: 250.02  . HYDROcodone-acetaminophen (NORCO) 7.5-325 MG per tablet Take 1 tablet by mouth daily as needed.   . Lancets 30G MISC Use to check BG twice daily.  Dx:  250.02  . lisinopril-hydrochlorothiazide (PRINZIDE,ZESTORETIC) 20-25 MG per tablet Take 1 tablet by mouth daily.  . metFORMIN (GLUCOPHAGE-XR) 500 MG 24 hr tablet Take 1 tablet (500 mg total) by mouth daily with breakfast.  . Omega-3 Fatty Acids (FISH OIL) 1000 MG CAPS Take by mouth 2 (two) times daily.    . ONGlory RosebushICA LANCETS 33G 55HC Use to check BG up to BID.  DX: E11.9  . Pitavastatin Calcium 2 MG TABS Take 1 tablet (2 mg total) by mouth every other day.  . sitaGLIPtin (JANUVIA) 100 MG tablet Take 1 tablet (100 mg total) by mouth daily.  . [DISCONTINUED] canagliflozin (INVOKANA) 100 MG TABS tablet Take 1 tablet (100 mg total) by mouth daily before breakfast.  . [DISCONTINUED] SitaGLIPtin-MetFORMIN HCl [id number] MG TB24 Take 1 tablet by mouth daily. (Patient not taking: Reported on  06/21/2015)   No facility-administered encounter medications on file as of 06/21/2015.    Allergies (verified) Codeine; Crestor; Erythromycin; Fenofibrate; Lyrica; Penicillins; Statins; Sulfa antibiotics; and Zocor   History: Past Medical History  Diagnosis Date  . Diabetes mellitus, type 2 ([place]) 06/2010  . Depression   . Hyperlipidemia   . Hypertension   . Hiatal hernia   . Fibromyalgia     [place]   . Esophageal stricture   . Colon polyps   . Arthritis     [place]   . Menopause   . GERD (gastroesophageal reflux disease)   . Vitamin D deficiency   . Peripheral vascular insufficiency ([place])   . Hemorrhoids   .  Diverticulosis   . Cancer [facility])    Past Surgical History  Procedure Laterality Date  . Total knee arthroplasty      bilateral  . Vesicovaginal fistula closure w/ tah  1981  . Refractive surgery  2003  . Elbow surgery      left  . Ankle surgery      right  . Joint replacement Bilateral     knee  . Abdominal hysterectomy      partial and then total  . Replacement total knee Bilateral    Family History  Problem Relation Age of Onset  . Diabetes Mother   . Heart failure Mother   . Hypertension Mother   . Cirrhosis Father   . Cirrhosis Brother    Social History   Occupational History  . Retired    Social History Main Topics  . Smoking status: Former Smoker -- 1.00 packs/day    Types: Cigarettes    Start date: 11/07/1975    Quit date: 06/24/2010  . Smokeless tobacco: Never Used  . Alcohol Use: No  . Drug Use: No  . Sexual Activity: Yes    Objective:    Today's Vitals   03/11/15 0853  BP: 130/70  Pulse: 77  Height: 5' 6"  (1.676 m)  Weight: 217 lb (98.431 kg)   Body mass index is 34.72 kg/(m^2).   Immunizations and Health Maintenance Immunization History  Administered Date(s) Administered  . Influenza,inj,Quad PF,36+ Mos 01/30/2013, 01/14/2015  . Influenza,inj,quad, With Preservative 01/04/2014  . Pneumococcal Conjugate-13 02/28/2013  . Pneumococcal Polysaccharide-23 02/05/2011  . Zoster 08/08/2009   There are no preventive care reminders to display for this patient.  Patient Care Team: Chipper Herb, MD as PCP - General (Family Medicine) Irene Shipper, MD as Consulting Physician (Gastroenterology) Sheralyn Boatman, MD as Consulting Physician (Psychiatry) Suella Broad, MD as Consulting Physician (Physical Medicine and Rehabilitation) Marica Otter, OD as Consulting Physician (Optometry)   Assessment:    Type 2 DM - not adequately controlled but improving since starting Invokana.  Obesity - weight has decreased by 7# since November 2016 HTN -  controlled  Elevated LFTs - improved     Plan:    1.  Continue current meds for diabetes - invokana 176m qd, metformin 507m1 tablet qd and sitagliptan 10062md.  2.  Continue with low CHO diet - great weight loss 3.  Add exercise as able - swimming to stationary bike.  4.  Continue to check BG 2-3 times daily.  Reviewed BG and A1c goals.  5.  RTC in 3 weeks to check BMET.  Will determine if increase in invokana to 300m6m needed.     EckaCherre RobinsARFlorida State Hospital [facility]/17/2017

## 2015-06-21 NOTE — Patient Instructions (Signed)
Diabetes and Standards of Medical Care   Diabetes is complicated. You may find that your diabetes team includes a dietitian, nurse, diabetes educator, eye doctor, and more. To help everyone know what is going on and to help you get the care you deserve, the following schedule of care was developed to help keep you on track. Below are the tests, exams, vaccines, medicines, education, and plans you will need.  Blood Glucose Goals Prior to meals = 80 - 130 Within 2 hours of the start of a meal = less than 180  HbA1c test (goal is less than 7.0% - your last value was 14%) This test shows how well you have controlled your glucose over the past 2 to 3 months. It is used to see if your diabetes management plan needs to be adjusted.   It is performed at least 2 times a year if you are meeting treatment goals.  It is performed 4 times a year if therapy has changed or if you are not meeting treatment goals.  Blood pressure test  This test is performed at every routine medical visit. The goal is less than 140/90 mmHg for most people, but 130/80 mmHg in some cases. Ask your health care provider about your goal.  Dental exam  Follow up with the dentist regularly.  Eye exam  If you are diagnosed with type 1 diabetes as a child, get an exam upon reaching the age of 52 years or older and have had diabetes for 3 to 5 years. Yearly eye exams are recommended after that initial eye exam.  If you are diagnosed with type 1 diabetes as an adult, get an exam within 5 years of diagnosis and then yearly.  If you are diagnosed with type 2 diabetes, get an exam as soon as possible after the diagnosis and then yearly.  Foot care exam  Visual foot exams are performed at every routine medical visit. The exams check for cuts, injuries, or other problems with the feet.  A comprehensive foot exam should be done yearly. This includes visual inspection as well as assessing foot pulses and testing for loss of  sensation.  Check your feet nightly for cuts, injuries, or other problems with your feet. Tell your health care provider if anything is not healing.  Kidney function test (urine microalbumin)  This test is performed once a year.  Type 1 diabetes: The first test is performed 5 years after diagnosis.  Type 2 diabetes: The first test is performed at the time of diagnosis.  A serum creatinine and estimated glomerular filtration rate (eGFR) test is done once a year to assess the level of chronic kidney disease (CKD), if present.  Lipid profile (cholesterol, HDL, LDL, triglycerides)  Performed every 5 years for most people.  The goal for LDL is less than 100 mg/dL. If you are at high risk, the goal is less than 70 mg/dL.  The goal for HDL is 40 mg/dL to 50 mg/dL for men and 50 mg/dL to 60 mg/dL for women. An HDL cholesterol of 60 mg/dL or higher gives some protection against heart disease.  The goal for triglycerides is less than 150 mg/dL.  Influenza vaccine, pneumococcal vaccine, and hepatitis B vaccine  The influenza vaccine is recommended yearly.  The pneumococcal vaccine is generally given once in a lifetime. However, there are some instances when another vaccination is recommended. Check with your health care provider.  The hepatitis B vaccine is also recommended for adults with diabetes.  Diabetes self-management education  Education is recommended at diagnosis and ongoing as needed.  Treatment plan  Your treatment plan is reviewed at every medical visit.  Document Released: 01/18/2009 Document Revised: 11/23/2012 Document Reviewed: 08/23/2012 [employer] Patient Information 2014 [employer].   

## 2015-06-25 DIAGNOSIS — B351 Tinea unguium: Secondary | ICD-10-CM | POA: Diagnosis not present

## 2015-06-25 DIAGNOSIS — L84 Corns and callosities: Secondary | ICD-10-CM | POA: Diagnosis not present

## 2015-06-25 DIAGNOSIS — E1142 Type 2 diabetes mellitus with diabetic polyneuropathy: Secondary | ICD-10-CM | POA: Diagnosis not present

## 2015-07-09 ENCOUNTER — Other Ambulatory Visit: Payer: Medicare Other

## 2015-07-09 DIAGNOSIS — E875 Hyperkalemia: Secondary | ICD-10-CM | POA: Diagnosis not present

## 2015-07-10 ENCOUNTER — Other Ambulatory Visit: Payer: Self-pay

## 2015-07-10 LAB — BMP8+EGFR
BUN/Creatinine Ratio: 23 (ref 12–28)
BUN: 16 mg/dL (ref 8–27)
CALCIUM: 9.6 mg/dL (ref 8.7–10.3)
CHLORIDE: 95 mmol/L — AB (ref 96–106)
CO2: 23 mmol/L (ref 18–29)
Creatinine, Ser: 0.7 mg/dL (ref 0.57–1.00)
GFR calc Af Amer: 102 mL/min/{1.73_m2} (ref >59)
GFR, EST NON AFRICAN AMERICAN: 89 mL/min/{1.73_m2} (ref >59)
Glucose: 128 mg/dL — ABNORMAL HIGH (ref 65–99)
POTASSIUM: 4.7 mmol/L (ref 3.5–5.2)
Sodium: 136 mmol/L (ref 134–144)

## 2015-07-11 ENCOUNTER — Telehealth: Payer: Self-pay | Admitting: Family Medicine

## 2015-07-11 ENCOUNTER — Encounter: Payer: Self-pay | Admitting: Internal Medicine

## 2015-07-11 NOTE — Telephone Encounter (Signed)
Appt made with Tammy for Follow up diabetic appt.

## 2015-07-19 ENCOUNTER — Other Ambulatory Visit: Payer: Self-pay | Admitting: Family Medicine

## 2015-07-25 ENCOUNTER — Other Ambulatory Visit: Payer: Self-pay | Admitting: Pediatrics

## 2015-07-26 ENCOUNTER — Other Ambulatory Visit: Payer: Self-pay | Admitting: *Deleted

## 2015-07-26 MED ORDER — BLOOD GLUCOSE TEST VI STRP: ORAL_STRIP | Status: DC

## 2015-07-29 ENCOUNTER — Ambulatory Visit (INDEPENDENT_AMBULATORY_CARE_PROVIDER_SITE_OTHER): Payer: Medicare Other | Admitting: Pharmacist

## 2015-07-29 ENCOUNTER — Encounter: Payer: Self-pay | Admitting: Pharmacist

## 2015-07-29 VITALS — BP 120/68 | HR 70 | Ht 66.0 in | Wt 214.0 lb

## 2015-07-29 DIAGNOSIS — E669 Obesity, unspecified: Secondary | ICD-10-CM

## 2015-07-29 DIAGNOSIS — E119 Type 2 diabetes mellitus without complications: Secondary | ICD-10-CM

## 2015-07-29 MED ORDER — CANAGLIFLOZIN 300 MG PO TABS: 300.0000 mg | ORAL_TABLET | Freq: Every day | ORAL | Status: DC

## 2015-07-29 NOTE — Progress Notes (Signed)
[name], female   DOB: Oct 23, 1945, 70 y.o.   MRN: [id number]   Subjective:   [name] is a 70 y.o. married, white female who presents for follow up uncontrolled DM.    Patient saw her PCP about 10 weeks ago and A1c was 14% (previous A1c has 7.0% 01/2015).   She is taking Invokana '100mg'$  daily, metformin XR '500mg'$  daily (was unable to tolerate higher doses due to GI distress) and Januvia '100mg'$  daily.  Patient is checking BG up to qid.  Readings since starting Invokana '100mg'$  qd range from 93 to 230.  Average HBG readings = 148  There is a trend that BG is improving over the last 8 weeks   Diet - patient has made significant changes to diet.  She is following an "Atkins type" diet - no bread, potatoes or corn.  She has increased non starchy vegetables.   Exercise is limiting by back pain.  Current Medications (verified) Outpatient Encounter Prescriptions as of 07/29/2015  Medication Sig  . ALPRAZolam (XANAX) 0.5 MG tablet Take 1 tablet (0.5 mg total) by mouth at bedtime as needed.  . ARIPiprazole (ABILIFY) 2 MG tablet TAKE 1 TABLET DAILY  . Blood Glucose Monitoring Suppl DEVI Blood glucose meter One Touch Brand. Use to check BS up to BID. DX 250.02  . cholecalciferol (VITAMIN D) 1000 UNITS tablet Take 2,000 Units by mouth daily.   Marland Kitchen conjugated estrogens (PREMARIN) vaginal cream Place 1 Applicatorful vaginally at bedtime as needed. Use q hs  . escitalopram (LEXAPRO) 20 MG tablet Take 1 tablet (20 mg total) by mouth daily.  Marland Kitchen esomeprazole (NEXIUM) 40 MG capsule TAKE 1 CAPSULE (40 MG TOTAL) BY MOUTH BID.  Marland Kitchen ezetimibe (ZETIA) 10 MG tablet Take 0.5 tablets (5 mg total) by mouth daily.  Marland Kitchen gabapentin (NEURONTIN) 600 MG tablet Take 1 tablet (600 mg total) by mouth 4 (four) times daily.  . Glucose Blood (BLOOD GLUCOSE TEST STRIPS) STRP Use to check BG qid.  DX:  E11.9  . glucose monitoring kit (FREESTYLE) monitoring kit Please dispense whichever glucometer patient's insurance will  cover.  Use to check BG up to twice.  DX: 250.02  . HYDROcodone-acetaminophen (NORCO) 7.5-325 MG per tablet Take 1 tablet by mouth daily as needed.   . Lancets 30G MISC Use to check BG twice daily.  Dx:  250.02  . lisinopril-hydrochlorothiazide (PRINZIDE,ZESTORETIC) 20-25 MG per tablet Take 1 tablet by mouth daily.  . metFORMIN (GLUCOPHAGE-XR) 500 MG 24 hr tablet Take 1 tablet (500 mg total) by mouth daily with breakfast.  . Omega-3 Fatty Acids (FISH OIL) 1000 MG CAPS Take by mouth 2 (two) times daily.    [name] DELICA LANCETS 40N MISC Use to check BG up to BID.  DX: E11.9  . Pitavastatin Calcium 2 MG TABS Take 1 tablet (2 mg total) by mouth every other day.  . [DISCONTINUED] canagliflozin (INVOKANA) 100 MG TABS tablet Take 1 tablet (100 mg total) by mouth daily before breakfast.  . [DISCONTINUED] ezetimibe (ZETIA) 10 MG tablet Take 1 tablet (10 mg total) by mouth daily.  . [DISCONTINUED] JANUVIA 100 MG tablet TAKE 1 TABLET DAILY  . canagliflozin (INVOKANA) 300 MG TABS tablet Take 1 tablet (300 mg total) by mouth daily before breakfast.   No facility-administered encounter medications on file as of 07/29/2015.    Allergies (verified) Codeine; Crestor; Erythromycin; Fenofibrate; Lyrica; Penicillins; Statins; Sulfa antibiotics; and Zocor   History: Past Medical History  Diagnosis Date  .  Diabetes mellitus, type 2 ([place]) 06/2010  . Depression   . Hyperlipidemia   . Hypertension   . Hiatal hernia   . Fibromyalgia     [place]   . Esophageal stricture   . Colon polyps   . Arthritis     [place]   . Menopause   . GERD (gastroesophageal reflux disease)   . Vitamin D deficiency   . Peripheral vascular insufficiency ([place])   . Hemorrhoids   . Diverticulosis   . Cancer [facility])    Past Surgical History  Procedure Laterality Date  . Total knee arthroplasty      bilateral  . Vesicovaginal fistula closure w/ tah  1981  . Refractive surgery  2003  . Elbow surgery      left  .  Ankle surgery      right  . Joint replacement Bilateral     knee  . Abdominal hysterectomy      partial and then total  . Replacement total knee Bilateral    Family History  Problem Relation Age of Onset  . Diabetes Mother   . Heart failure Mother   . Hypertension Mother   . Cirrhosis Father   . Cirrhosis Brother     Objective:    Today's Vitals   03/11/15 0853  BP: 130/70  Pulse: 77  Height: 5' 6" (1.676 m)  Weight: 217 lb (98.431 kg)   Body mass index is 34.56 kg/(m^2).   Assessment:    Type 2 DM - not adequately controlled but improving since starting Invokana.  Obesity - weight has decreased by 7# since November 2016 HTN - controlled     Plan:    1.  Increase invokana to 388m (gave #30 tablets of samples)      Stop Januvia (to help decrease monthly drug cost)       Continue metformin 5079m1 tablet qd.      Change Zetia 1012mo 1/2 tablet qd to decrease monthly drug cost (an maybe prolong time to coverage gap)  2.  Continue with low CHO diet / low calorie - continued weight loss 3.  Add exercise as able - swimming or stationary bike.  4.  Continue to check BG up to 3 times daily.   5.  RTC in 3 weeks to check BMET/A1c    EckCherre RobinsHALaguna Treatment Hospital, LLC4/24/2017

## 2015-07-29 NOTE — Patient Instructions (Signed)
Stop Januvia  Increase Invokana to '300mg'$  - take 1 tablet each morning (can use up '100mg'$  by taking 3 tablets)  Decrease Zetia to '10mg'$  1/2 tablet daily  Diabetes and Standards of Medical Care   Diabetes is complicated. You may find that your diabetes team includes a dietitian, nurse, diabetes educator, eye doctor, and more. To help everyone know what is going on and to help you get the care you deserve, the following schedule of care was developed to help keep you on track. Below are the tests, exams, vaccines, medicines, education, and plans you will need.  Blood Glucose Goals Prior to meals = 80 - 130 Within 2 hours of the start of a meal = less than 180  HbA1c test (goal is less than 7.0% - your last value was 14.0%) This test shows how well you have controlled your glucose over the past 2 to 3 months. It is used to see if your diabetes management plan needs to be adjusted.   It is performed at least 2 times a year if you are meeting treatment goals.  It is performed 4 times a year if therapy has changed or if you are not meeting treatment goals.  Blood pressure test  This test is performed at every routine medical visit. The goal is less than 140/90 mmHg for most people, but 130/80 mmHg in some cases. Ask your health care provider about your goal.  Dental exam  Follow up with the dentist regularly.  Eye exam  If you are diagnosed with type 1 diabetes as a child, get an exam upon reaching the age of 54 years or older and have had diabetes for 3 to 5 years. Yearly eye exams are recommended after that initial eye exam.  If you are diagnosed with type 1 diabetes as an adult, get an exam within 5 years of diagnosis and then yearly.  If you are diagnosed with type 2 diabetes, get an exam as soon as possible after the diagnosis and then yearly.  Foot care exam  Visual foot exams are performed at every routine medical visit. The exams check for cuts, injuries, or other problems with  the feet.  A comprehensive foot exam should be done yearly. This includes visual inspection as well as assessing foot pulses and testing for loss of sensation.  Check your feet nightly for cuts, injuries, or other problems with your feet. Tell your health care provider if anything is not healing.  Kidney function test (urine microalbumin)  This test is performed once a year.  Type 1 diabetes: The first test is performed 5 years after diagnosis.  Type 2 diabetes: The first test is performed at the time of diagnosis.  A serum creatinine and estimated glomerular filtration rate (eGFR) test is done once a year to assess the level of chronic kidney disease (CKD), if present.  Lipid profile (cholesterol, HDL, LDL, triglycerides)  Performed every 5 years for most people.  The goal for LDL is less than 100 mg/dL. If you are at high risk, the goal is less than 70 mg/dL.  The goal for HDL is 40 mg/dL to 50 mg/dL for men and 50 mg/dL to 60 mg/dL for women. An HDL cholesterol of 60 mg/dL or higher gives some protection against heart disease.  The goal for triglycerides is less than 150 mg/dL.  Influenza vaccine, pneumococcal vaccine, and hepatitis B vaccine  The influenza vaccine is recommended yearly.  The pneumococcal vaccine is generally given once in a  lifetime. However, there are some instances when another vaccination is recommended. Check with your health care provider.  The hepatitis B vaccine is also recommended for adults with diabetes.  Diabetes self-management education  Education is recommended at diagnosis and ongoing as needed.  Treatment plan  Your treatment plan is reviewed at every medical visit.  Document Released: 01/18/2009 Document Revised: 11/23/2012 Document Reviewed: 08/23/2012 [facility] Patient Information 2014 [place].

## 2015-07-31 ENCOUNTER — Ambulatory Visit: Payer: Medicare Other | Admitting: Pharmacist

## 2015-07-31 ENCOUNTER — Other Ambulatory Visit: Payer: Self-pay | Admitting: Pediatrics

## 2015-07-31 ENCOUNTER — Other Ambulatory Visit: Payer: Self-pay | Admitting: Pharmacist

## 2015-07-31 DIAGNOSIS — E119 Type 2 diabetes mellitus without complications: Secondary | ICD-10-CM | POA: Diagnosis not present

## 2015-07-31 MED ORDER — CANAGLIFLOZIN 300 MG PO TABS: 300.0000 mg | ORAL_TABLET | Freq: Every day | ORAL | Status: DC

## 2015-08-02 DIAGNOSIS — M4806 Spinal stenosis, lumbar region: Secondary | ICD-10-CM | POA: Diagnosis not present

## 2015-08-02 DIAGNOSIS — M47816 Spondylosis without myelopathy or radiculopathy, lumbar region: Secondary | ICD-10-CM | POA: Diagnosis not present

## 2015-08-02 DIAGNOSIS — G894 Chronic pain syndrome: Secondary | ICD-10-CM | POA: Diagnosis not present

## 2015-08-02 DIAGNOSIS — M5090 Cervical disc disorder, unspecified, unspecified cervical region: Secondary | ICD-10-CM | POA: Diagnosis not present

## 2015-08-30 ENCOUNTER — Other Ambulatory Visit: Payer: Self-pay | Admitting: Pediatrics

## 2015-09-05 ENCOUNTER — Ambulatory Visit: Payer: Self-pay | Admitting: Pharmacist

## 2015-09-09 DIAGNOSIS — Z1231 Encounter for screening mammogram for malignant neoplasm of breast: Secondary | ICD-10-CM | POA: Diagnosis not present

## 2015-09-10 ENCOUNTER — Encounter: Payer: Self-pay | Admitting: Pharmacist

## 2015-09-10 ENCOUNTER — Ambulatory Visit (INDEPENDENT_AMBULATORY_CARE_PROVIDER_SITE_OTHER): Payer: Medicare Other | Admitting: Pharmacist

## 2015-09-10 VITALS — BP 126/68 | HR 72 | Ht 66.0 in | Wt 213.0 lb

## 2015-09-10 DIAGNOSIS — E119 Type 2 diabetes mellitus without complications: Secondary | ICD-10-CM | POA: Diagnosis not present

## 2015-09-10 DIAGNOSIS — E669 Obesity, unspecified: Secondary | ICD-10-CM | POA: Diagnosis not present

## 2015-09-10 DIAGNOSIS — Z79899 Other long term (current) drug therapy: Secondary | ICD-10-CM

## 2015-09-10 LAB — BMP8+EGFR
BUN/Creatinine Ratio: 20 (ref 12–28)
BUN: 16 mg/dL (ref 8–27)
CALCIUM: 9.2 mg/dL (ref 8.7–10.3)
CHLORIDE: 95 mmol/L — AB (ref 96–106)
CO2: 24 mmol/L (ref 18–29)
Creatinine, Ser: 0.81 mg/dL (ref 0.57–1.00)
GFR calc Af Amer: 86 mL/min/{1.73_m2} (ref >59)
GFR calc non Af Amer: 74 mL/min/{1.73_m2} (ref >59)
GLUCOSE: 147 mg/dL — AB (ref 65–99)
Potassium: 5.3 mmol/L — ABNORMAL HIGH (ref 3.5–5.2)
Sodium: 133 mmol/L — ABNORMAL LOW (ref 134–144)

## 2015-09-10 LAB — BAYER DCA HB A1C WAIVED: HB A1C: 7.4 % — AB (ref <7.0)

## 2015-09-10 MED ORDER — DULAGLUTIDE 0.75 MG/0.5ML ~~LOC~~ SOAJ: 0.7500 mg | SUBCUTANEOUS | Status: DC

## 2015-09-10 NOTE — Progress Notes (Addendum)
[name], female   DOB: Nov 07, 1945, 70 y.o.   MRN: [id number]   Subjective:   [name] is a 70 y.o. married, white female who presents for follow up uncontrolled DM.    Patient saw me about 70 month ago and Invokana was increased to 320m QD.  Her last A1c was 14% on 06/05/215 - (previous A1c has 7.0% 01/2015).   She is taking Invokana 3086mdaily, metformin XR 50039maily (was unable to tolerate higher doses due to GI distress)  We stopped Januvia at last appt trying to decrease cost.  Patient is checking BG up to bid.  Readings since our last visit range from 130 to 232.  Average HBG readings in am = 158   Diet -She is following an "Atkins type" diet - trying to limit serving sizes of potatoes and starches.  She has increased non starchy vegetables. Salads most nights.  No sweets.   Exercise is limited by back pain.  Current Medications (verified) Outpatient Encounter Prescriptions 70 as of 09/10/2015  Medication Sig  . ALPRAZolam (XANAX) 0.5 MG tablet Take 1 tablet (0.5 mg total) by mouth at bedtime as needed.  . ARIPiprazole (ABILIFY) 2 MG tablet Take 1 tablet (2 mg total) by mouth daily. Take in the evening  . Blood Glucose Monitoring Suppl DEVI Blood glucose meter One Touch Brand. Use to check BS up to BID. DX 250.02  . canagliflozin (INVOKANA) 300 MG TABS tablet Take 0.5 tablets by mouth daily before breakfast.  . cholecalciferol (VITAMIN D) 1000 UNITS tablet Take 2,000 Units by mouth daily.   . cMarland Kitchennjugated estrogens (PREMARIN) vaginal cream Place 1 Applicatorful vaginally at bedtime as needed. Use q hs  . escitalopram (LEXAPRO) 20 MG tablet Take 1 tablet (20 mg total) by mouth daily.  . eMarland Kitchenomeprazole (NEXIUM) 40 MG capsule TAKE 1 CAPSULE (40 MG TOTAL) BY MOUTH BID.  . eMarland Kitchenetimibe (ZETIA) 10 MG tablet Take 0.5 tablets (5 mg total) by mouth daily.  . gMarland Kitchenbapentin (NEURONTIN) 600 MG tablet Take 1 tablet (600 mg total) by mouth 4 (four) times daily.  . Glucose Blood (BLOOD  GLUCOSE TEST STRIPS) STRP Use to check BG qid.  DX:  E11.9  . glucose monitoring kit (FREESTYLE) monitoring kit Please dispense whichever glucometer patient's insurance will cover.  Use to check BG up to twice.  DX: 250.02  . HYDROcodone-acetaminophen (NORCO) 7.5-325 MG per tablet Take 1 tablet by mouth daily as needed.   . Lancets 30G MISC Use to check BG twice daily.  Dx:  250.02  . lisinopril-hydrochlorothiazide (PRINZIDE,ZESTORETIC) 20-25 MG per tablet Take 1 tablet by mouth daily.  . metFORMIN (GLUCOPHAGE-XR) 500 MG 24 hr tablet TAKE (1) TABLET DAILY WITH BREAKFAST.  . OMarland [place]-3 Fatty Acids (FISH OIL) 1000 MG CAPS Take by mouth 2 (two) times daily.    . OGlory RosebushLICA LANCETS [id number] Use to check BG up to BID.  DX: E11.9  . Pitavastatin Calcium 2 MG TABS Take 1 tablet (2 mg total) by mouth every other day.  . [DISCONTINUED] ARIPiprazole (ABILIFY) 2 MG tablet TAKE 1 TABLET DAILY  . [DISCONTINUED] canagliflozin (INVOKANA) 300 MG TABS tablet Take 1 tablet (300 mg total) by mouth daily before breakfast.  . Dulaglutide (TRULICITY) 0.70.81/KG/8.1EHPN Inject 0.75 mg into the skin once a week.   No facility-administered encounter medications on file 70 as of 09/10/2015.    Allergies (verified) Codeine; Crestor; Erythromycin; Fenofibrate; Lyrica; Penicillins; Statins; Sulfa antibiotics; and Zocor   History: Past  Medical History  Diagnosis Date  . Diabetes mellitus, type 2 ([place]) 06/2010  . Depression   . Hyperlipidemia   . Hypertension   . Hiatal hernia   . Fibromyalgia     [name]   . Esophageal stricture   . Colon polyps   . Arthritis     [name]   . Menopause   . GERD (gastroesophageal reflux disease)   . Vitamin D deficiency   . Peripheral vascular insufficiency ([place])   . Hemorrhoids   . Diverticulosis   . Cancer [name])    Past Surgical History  Procedure Laterality Date  . Total knee arthroplasty      bilateral  . Vesicovaginal fistula closure w/ tah  1981  . Refractive  surgery  2003  . Elbow surgery      left  . Ankle surgery      right  . Joint replacement Bilateral     knee  . Abdominal hysterectomy      partial and then total  . Replacement total knee Bilateral    Family History  Problem Relation Age of Onset  . Diabetes Mother   . Heart failure Mother   . Hypertension Mother   . Cirrhosis Father   . Cirrhosis Brother    Depression screen [name] 2/9 09/10/2015 06/05/2015 03/11/2015 01/14/2015 11/08/2014  Decreased Interest 1 0 0 1 0  Down, Depressed, Hopeless 0 0 0 0 0  PHQ - 2 Score 1 0 0 1 0  Altered sleeping 0 - - - -  Tired, decreased energy 2 - - - -  Change in appetite 0 - - - -  Feeling bad or failure about yourself  0 - - - -  Trouble concentrating 0 - - - -  Moving slowly or fidgety/restless 0 - - - -  Suicidal thoughts 0 - - - -  PHQ-9 Score 3 - - - -  Difficult doing work/chores Somewhat difficult - - - -      Objective:    Today's Vitals   03/11/15 0853  BP: 130/70  Pulse: 77  Height: 5' 6"  (1.676 m)  Weight: 217 lb (98.431 kg)   Body mass index is 34.4 kg/(m^2).   Assessment:    Type 2 DM - not adequately controlled but improved since March 2017  Obesity - weight has decreased by 8# since November 2016 HTN - controlled  Fatigue during day - possibly related to taking abilify in am    Plan:    1.  Start Trulicity 6.50PT SQ q week      Decrease Invokana to 375m 1/2 tablet qd      Change abilify to 1 tablet qPM to see if this helps with daytime fatigue       Continue metformin 5049m1 tablet qd. 2.  Continue with low CHO diet / low calorie - continued weight loss 3.  Again encouraged her to add exercise as able - swimming or stationary bike.  4.  Continue to check BG up to 2 times daily.   5.  RTC in 6 weeks to follow up with PCP and 3 months to see me.   Discussed getting urine microalb. Today but patient was unable to void / had just voided prior to visit.    EcCherre RobinsPHHardeman County Memorial Hospital 09/10/2015         A1c has improved from 14% TO 7.4%

## 2015-09-11 ENCOUNTER — Other Ambulatory Visit: Payer: Self-pay | Admitting: Pharmacist

## 2015-09-11 DIAGNOSIS — E875 Hyperkalemia: Secondary | ICD-10-CM

## 2015-09-20 ENCOUNTER — Other Ambulatory Visit: Payer: Self-pay | Admitting: Family Medicine

## 2015-09-24 ENCOUNTER — Other Ambulatory Visit: Payer: Medicare Other

## 2015-09-24 ENCOUNTER — Other Ambulatory Visit: Payer: Self-pay | Admitting: Pharmacist

## 2015-09-24 DIAGNOSIS — L84 Corns and callosities: Secondary | ICD-10-CM | POA: Diagnosis not present

## 2015-09-24 DIAGNOSIS — E875 Hyperkalemia: Secondary | ICD-10-CM

## 2015-09-24 DIAGNOSIS — B351 Tinea unguium: Secondary | ICD-10-CM | POA: Diagnosis not present

## 2015-09-24 DIAGNOSIS — E1142 Type 2 diabetes mellitus with diabetic polyneuropathy: Secondary | ICD-10-CM | POA: Diagnosis not present

## 2015-09-25 LAB — SPECIMEN STATUS

## 2015-09-26 ENCOUNTER — Telehealth: Payer: Self-pay

## 2015-09-26 LAB — BMP8+EGFR
BUN/Creatinine Ratio: 21 (ref 12–28)
BUN: 16 mg/dL (ref 8–27)
CALCIUM: 9.6 mg/dL (ref 8.7–10.3)
CHLORIDE: 95 mmol/L — AB (ref 96–106)
CO2: 23 mmol/L (ref 18–29)
Creatinine, Ser: 0.78 mg/dL (ref 0.57–1.00)
GFR calc non Af Amer: 78 mL/min/{1.73_m2} (ref >59)
GFR, EST AFRICAN AMERICAN: 90 mL/min/{1.73_m2} (ref >59)
Glucose: 129 mg/dL — ABNORMAL HIGH (ref 65–99)
POTASSIUM: 4.8 mmol/L (ref 3.5–5.2)
SODIUM: 138 mmol/L (ref 134–144)

## 2015-09-26 NOTE — Telephone Encounter (Signed)
Insurance authorized Esomeprazole

## 2015-09-27 ENCOUNTER — Telehealth: Payer: Self-pay | Admitting: Family Medicine

## 2015-09-30 NOTE — Telephone Encounter (Signed)
Patient states that BG has really improved with Trulicity.   BG ranges from 117 to 135.  Cost of invokana was about $160.  She would like to try to decrease cost if possible.  Recommended she hold invokana and continue to check BG regularly.  If BG begins to increase over 150 she is to call me for instruction.

## 2015-09-30 NOTE — Telephone Encounter (Signed)
Tried to call - left message for patient.

## 2015-10-21 ENCOUNTER — Telehealth: Payer: Self-pay | Admitting: Family Medicine

## 2015-10-21 ENCOUNTER — Other Ambulatory Visit: Payer: Self-pay | Admitting: Family Medicine

## 2015-10-21 MED ORDER — DULAGLUTIDE 0.75 MG/0.5ML ~~LOC~~ SOAJ: 0.7500 mg | SUBCUTANEOUS | Status: DC

## 2015-10-21 NOTE — Telephone Encounter (Signed)
Left #2 samples for patient

## 2015-10-24 ENCOUNTER — Telehealth: Payer: Self-pay | Admitting: Family Medicine

## 2015-10-28 MED ORDER — DULAGLUTIDE 0.75 MG/0.5ML ~~LOC~~ SOAJ: 0.7500 mg | SUBCUTANEOUS | 0 refills | Status: DC

## 2015-10-28 NOTE — Telephone Encounter (Signed)
Patient given #4 pens of Trulicity 0.75mg  samples  Will check with her insurance to see if alternative is available with better coverage.

## 2015-10-31 ENCOUNTER — Encounter: Payer: Self-pay | Admitting: Family Medicine

## 2015-10-31 ENCOUNTER — Ambulatory Visit (INDEPENDENT_AMBULATORY_CARE_PROVIDER_SITE_OTHER): Payer: Medicare Other | Admitting: Family Medicine

## 2015-10-31 ENCOUNTER — Ambulatory Visit (INDEPENDENT_AMBULATORY_CARE_PROVIDER_SITE_OTHER): Payer: Medicare Other

## 2015-10-31 VITALS — BP 134/72 | HR 86 | Temp 98.4°F | Ht 66.0 in | Wt 212.0 lb

## 2015-10-31 DIAGNOSIS — E559 Vitamin D deficiency, unspecified: Secondary | ICD-10-CM

## 2015-10-31 DIAGNOSIS — M797 Fibromyalgia: Secondary | ICD-10-CM

## 2015-10-31 DIAGNOSIS — E1169 Type 2 diabetes mellitus with other specified complication: Secondary | ICD-10-CM

## 2015-10-31 DIAGNOSIS — K219 Gastro-esophageal reflux disease without esophagitis: Secondary | ICD-10-CM | POA: Diagnosis not present

## 2015-10-31 DIAGNOSIS — I10 Essential (primary) hypertension: Secondary | ICD-10-CM | POA: Diagnosis not present

## 2015-10-31 DIAGNOSIS — E785 Hyperlipidemia, unspecified: Secondary | ICD-10-CM

## 2015-10-31 DIAGNOSIS — M5137 Other intervertebral disc degeneration, lumbosacral region: Secondary | ICD-10-CM

## 2015-10-31 DIAGNOSIS — E119 Type 2 diabetes mellitus without complications: Secondary | ICD-10-CM | POA: Diagnosis not present

## 2015-10-31 MED ORDER — ESOMEPRAZOLE MAGNESIUM 40 MG PO CPDR: DELAYED_RELEASE_CAPSULE | ORAL | 3 refills | Status: DC

## 2015-10-31 MED ORDER — METFORMIN HCL ER 500 MG PO TB24: ORAL_TABLET | ORAL | 1 refills | Status: DC

## 2015-10-31 MED ORDER — ESCITALOPRAM OXALATE 20 MG PO TABS: 20.0000 mg | ORAL_TABLET | Freq: Every day | ORAL | 1 refills | Status: DC

## 2015-10-31 MED ORDER — ARIPIPRAZOLE 2 MG PO TABS: 2.0000 mg | ORAL_TABLET | Freq: Every day | ORAL | 1 refills | Status: DC

## 2015-10-31 MED ORDER — EZETIMIBE 10 MG PO TABS: 10.0000 mg | ORAL_TABLET | Freq: Every day | ORAL | 3 refills | Status: DC

## 2015-10-31 MED ORDER — GABAPENTIN 600 MG PO TABS: 600.0000 mg | ORAL_TABLET | Freq: Four times a day (QID) | ORAL | 2 refills | Status: DC

## 2015-10-31 MED ORDER — ESTROGENS, CONJUGATED 0.625 MG/GM VA CREA: 1.0000 | TOPICAL_CREAM | Freq: Every evening | VAGINAL | 1 refills | Status: DC | PRN

## 2015-10-31 MED ORDER — LISINOPRIL-HYDROCHLOROTHIAZIDE 20-25 MG PO TABS: 1.0000 | ORAL_TABLET | Freq: Every day | ORAL | 3 refills | Status: DC

## 2015-10-31 NOTE — Progress Notes (Signed)
Subjective:    [name], female    DOB: 04-18-1945, 70 y.o.   MRN: [id number]  HPI Pt here for follow up and management of chronic medical problems which includes diabetes, hyperlipidemia and hypertension. She is taking medications regularly.The patient has no specific complaint is and is requesting several refills. She did have a hemoglobin A1c on June 6 and it was 7.4%. She is due to get a chest x-ray today. She will also get a urine microalbumin today. Her weight is 212 pounds and this is consistent with past readings. Vital signs were stable. The patient is followed by Dr. Leane Para for her chronic pain management with fibromyalgia and back pain. He is done I'll he can do with injections in her back and pretty much right her pain medication for her. She she's had both knees replaced. She denies any chest pain or shortness of breath. She has no trouble with heartburn indigestion nausea vomiting diarrhea or blood in the stool. She is up-to-date on her colonoscopies. She does have constipated problems secondary to her pain medicine and takes stool softeners for this. She is passing her water without problems. She gets her eye exams done yearly. She is no longer smoking. She states is active as possible.      Patient Active Problem List   Diagnosis Date Noted  . 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 Depression 02/28/2013  . GERD (gastroesophageal reflux disease) 02/28/2013  . Hiatal hernia with gastroesophageal reflux 02/28/2013  . Peripheral neuropathy ([place]) 02/28/2013  . DDD (degenerative disc disease), lumbosacral 06/23/2012  . Fibromyalgia syndrome 06/23/2012  . Essential hypertension, benign 06/23/2012  . Osteopenia 06/23/2012  . Diabetes ([place]) 06/23/2012  . Abnormal EKG 10/29/2010  . Obesity due to excess calories 10/29/2010  . 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 DYSPHAGIA 10/11/2009  . PERSONAL HISTORY OF COLONIC POLYPS 10/11/2009    Outpatient Encounter Prescriptions as of 10/31/2015  Medication Sig  . ALPRAZolam (XANAX) 0.5 MG tablet Take 1 tablet (0.5 mg total) by mouth at bedtime as needed.  . ARIPiprazole (ABILIFY) 2 MG tablet TAKE 1 TABLET DAILY  . Blood Glucose Monitoring Suppl DEVI Blood glucose meter One Touch Brand. Use to check BS up to BID. DX 250.02  . cholecalciferol (VITAMIN D) 1000 UNITS tablet Take 2,000 Units by mouth daily.   Marland Kitchen conjugated estrogens (PREMARIN) vaginal cream Place 1 Applicatorful vaginally at bedtime as needed. Use q hs  . Dulaglutide (TRULICITY) 3.66 YQ/0.3KV SOPN Inject 0.75 mg into the skin once a week.  . escitalopram (LEXAPRO) 20 MG tablet Take 1 tablet (20 mg total) by mouth daily.  Marland Kitchen esomeprazole (NEXIUM) 40 MG capsule TAKE 1 CAPSULE (40 MG TOTAL) BY MOUTH BID.  Marland Kitchen gabapentin (NEURONTIN) 600 MG tablet Take 1 tablet (600 mg total) by mouth 4 (four) times daily.  . Glucose Blood (BLOOD GLUCOSE TEST STRIPS) STRP Use to check BG qid.  DX:  E11.9  . glucose monitoring kit (FREESTYLE) monitoring kit Please dispense whichever glucometer patient's insurance will cover.  Use to check BG up to twice.  DX: 250.02  . HYDROcodone-acetaminophen (NORCO) 7.5-325 MG per tablet Take 1 tablet by mouth daily as needed.   . Lancets 30G MISC Use to check BG twice daily.  Dx:  250.02  . lisinopril-hydrochlorothiazide (PRINZIDE,ZESTORETIC) 20-25 MG per tablet Take 1 tablet by mouth daily.  . metFORMIN (GLUCOPHAGE-XR) 500 MG 24 hr tablet TAKE (1) TABLET DAILY WITH BREAKFAST.  Marland Kitchen Omega-3 Fatty Acids (FISH OIL)  1000 MG CAPS Take by mouth 2 (two) times daily.    [name] DELICA LANCETS 94W MISC Use to check BG up to BID.  DX: E11.9  . Pitavastatin Calcium 2 MG TABS Take 1 tablet (2 mg total) by mouth every other day.  Marland Kitchen ZETIA 10 MG tablet TAKE 1 TABLET ONCE A DAY  . [DISCONTINUED] canagliflozin (INVOKANA) 300 MG TABS tablet Take 0.5 tablets by mouth daily before breakfast.   No facility-administered  encounter medications on file as of 10/31/2015.      Review of Systems  Constitutional: Negative.   HENT: Negative.   Eyes: Negative.   Respiratory: Negative.   Cardiovascular: Negative.   Gastrointestinal: Negative.   Endocrine: Negative.   Genitourinary: Negative.   Musculoskeletal: Negative.   Skin: Negative.   Allergic/Immunologic: Negative.   Neurological: Negative.   Hematological: Negative.   Psychiatric/Behavioral: Negative.        Objective:   Physical Exam  Constitutional: She is oriented to person, place, and time. She appears well-developed and well-nourished. No distress.  Pleasant and alert  HENT:  Head: Normocephalic and atraumatic.  Right Ear: External ear normal.  Left Ear: External ear normal.  Nose: Nose normal.  Mouth/Throat: Oropharynx is clear and moist.  Eyes: Conjunctivae and EOM are normal. Pupils are equal, round, and reactive to light. Right eye exhibits no discharge. Left eye exhibits no discharge. No scleral icterus.  Neck: Normal range of motion. Neck supple. No thyromegaly present.  No bruits or thyromegaly or anterior cervical adenopathy  Cardiovascular: Normal rate, regular rhythm, normal heart sounds and intact distal pulses.   No murmur heard. The heart is regular at 84/m.  Pulmonary/Chest: Effort normal and breath sounds normal. No respiratory distress. She has no wheezes. She has no rales. She exhibits no tenderness.  Lungs are clear anteriorly and posteriorly  Abdominal: Soft. Bowel sounds are normal. She exhibits no mass. There is tenderness. There is no rebound and no guarding.  There is some epigastric tenderness and no suprapubic tenderness. There is no liver or spleen enlargement palpable. There were no bruits.  Musculoskeletal: Normal range of motion. She exhibits no edema.  Lymphadenopathy:    She has no cervical adenopathy.  Neurological: She is alert and oriented to person, place, and time. She has normal reflexes. No cranial  nerve deficit.  Skin: Skin is warm and dry. No rash noted.  Psychiatric: She has a normal mood and affect. Her behavior is normal. Judgment and thought content normal.  Nursing note and vitals reviewed.  BP 134/72 (BP Location: Left Arm)   Pulse 86   Temp 98.4 F (36.9 C) (Oral)   Ht 5' 6"  (1.676 m)   Wt 212 lb (96.2 kg)   BMI 34.22 kg/m   [facility] reading (PRIMARY) by  DrMoore-S x-ray with results pending                                       Assessment & Plan:  1. Type 2 diabetes mellitus without complication, without long-term current use of insulin (HCC) -Continue to work with clinical pharmacy regarding expense of Trulicity to find another insulin regimen that will be cheaper and still work as effectively. -Continue with aggressive therapeutic lifestyle changes and other medications - Microalbumin / creatinine urine ratio  2. Essential hypertension, benign -Blood pressure is good today continue with current treatment - DG Chest 2 View; Future -  Hepatic function panel  3. Hyperlipidemia associated with type 2 diabetes mellitus ([place]) -Continue with current treatment pending results of lab work - NMR, lipoprofile  4. Vitamin D deficiency -Continue with vitamin D replacement pending results of lab work - VITAMIN D 25 Hydroxy (Vit-D Deficiency, Fractures)  5. Fibromyalgia syndrome -Continue to follow-up with pain management and stay as active as possible  6. Gastroesophageal reflux disease, esophagitis presence not specified -Continue to watch diet as closely as possible and continue with Nexium.  7. DDD (degenerative disc disease), lumbosacral -Follow-up with orthopedist as doing  Meds ordered this encounter  Medications  . ezetimibe (ZETIA) 10 MG tablet    Sig: Take 1 tablet (10 mg total) by mouth daily.    Dispense:  30 tablet    Refill:  3  . metFORMIN (GLUCOPHAGE-XR) 500 MG 24 hr tablet    Sig: TAKE (1) TABLET DAILY WITH BREAKFAST.    Dispense:  90 tablet     Refill:  1  . lisinopril-hydrochlorothiazide (PRINZIDE,ZESTORETIC) 20-25 MG tablet    Sig: Take 1 tablet by mouth daily.    Dispense:  90 tablet    Refill:  3  . gabapentin (NEURONTIN) 600 MG tablet    Sig: Take 1 tablet (600 mg total) by mouth 4 (four) times daily.    Dispense:  360 tablet    Refill:  2  . esomeprazole (NEXIUM) 40 MG capsule    Sig: TAKE 1 CAPSULE (40 MG TOTAL) BY MOUTH BID.    Dispense:  180 capsule    Refill:  3  . escitalopram (LEXAPRO) 20 MG tablet    Sig: Take 1 tablet (20 mg total) by mouth daily.    Dispense:  90 tablet    Refill:  1  . conjugated estrogens (PREMARIN) vaginal cream    Sig: Place 1 Applicatorful vaginally at bedtime as needed. Use q hs    Dispense:  42.5 g    Refill:  1  . ARIPiprazole (ABILIFY) 2 MG tablet    Sig: Take 1 tablet (2 mg total) by mouth daily.    Dispense:  90 tablet    Refill:  1   Patient Instructions                       Medicare Annual Wellness Visit  [facility] and the medical providers at [facility] strive to bring you the best medical care.  In doing so we not only want to address your current medical conditions and concerns but also to detect new conditions early and prevent illness, disease and health-related problems.    Medicare offers a yearly Wellness Visit which allows our clinical staff to assess your need for preventative services including immunizations, lifestyle education, counseling to decrease risk of preventable diseases and screening for fall risk and other medical concerns.    This visit is provided free of charge (no copay) for all Medicare recipients. The clinical pharmacists at [facility] have begun to conduct these Wellness Visits which will also include a thorough review of all your medications.    As you primary medical provider recommend that you make an appointment for your Annual Wellness Visit if you have not done so already this year.  You may  set up this appointment before you leave today or you may call back [phone]) and schedule an appointment.  Please make sure when you call that you mention that you are scheduling your Annual Wellness  Visit with the clinical pharmacist so that the appointment may be made for the proper length of time.     Continue current medications. Continue good therapeutic lifestyle changes which include good diet and exercise. Fall precautions discussed with patient. If an FOBT was given today- please return it to our front desk. If you are over 42 years old - you may need Prevnar 24 or the adult Pneumonia vaccine.  After your visit with [place] today you will receive a survey in the mail or online from [employer] regarding your care with [place]. Please take a moment to fill this out. Your feedback is very important to [place] as you can help [place] better understand your patient needs as well as improve your experience and satisfaction. WE CARE ABOUT YOU!!!   Continue to follow-up with orthopedist who is her pain management physician. Stay is active as possible Drink plenty of fluids Always be careful and do not put yourself at risk for falling We will call with lab work results and chest x-ray results as soon as these become available  Arrie Senate MD

## 2015-10-31 NOTE — Patient Instructions (Addendum)
Medicare Annual Wellness Visit  [facility] and the medical providers at [facility] strive to bring you the best medical care.  In doing so we not only want to address your current medical conditions and concerns but also to detect new conditions early and prevent illness, disease and health-related problems.    Medicare offers a yearly Wellness Visit which allows our clinical staff to assess your need for preventative services including immunizations, lifestyle education, counseling to decrease risk of preventable diseases and screening for fall risk and other medical concerns.    This visit is provided free of charge (no copay) for all Medicare recipients. The clinical pharmacists at [facility] have begun to conduct these Wellness Visits which will also include a thorough review of all your medications.    As you primary medical provider recommend that you make an appointment for your Annual Wellness Visit if you have not done so already this year.  You may set up this appointment before you leave today or you may call back [phone] and schedule an appointment.  Please make sure when you call that you mention that you are scheduling your Annual Wellness Visit with the clinical pharmacist so that the appointment may be made for the proper length of time.     Continue current medications. Continue good therapeutic lifestyle changes which include good diet and exercise. Fall precautions discussed with patient. If an FOBT was given today- please return it to our front desk. If you are over 9 years old - you may need Prevnar 43 or the adult Pneumonia vaccine.  After your visit with [place] today you will receive a survey in the mail or online from [employer] regarding your care with [place]. Please take a moment to fill this out. Your feedback is very important to [place] as you can help [place] better understand your patient needs as well as improve  your experience and satisfaction. WE CARE ABOUT YOU!!!   Continue to follow-up with orthopedist who is her pain management physician. Stay is active as possible Drink plenty of fluids Always be careful and do not put yourself at risk for falling We will call with lab work results and chest x-ray results as soon as these become available

## 2015-10-31 NOTE — Addendum Note (Signed)
Addended by: Zannie Cove on: 10/31/2015 09:02 AM   Modules accepted: Orders

## 2015-11-01 LAB — HEPATIC FUNCTION PANEL
ALT: 31 IU/L (ref 0–32)
AST: 30 IU/L (ref 0–40)
Albumin: 4.2 g/dL (ref 3.6–4.8)
Alkaline Phosphatase: 83 IU/L (ref 39–117)
Bilirubin Total: 0.3 mg/dL (ref 0.0–1.2)
Bilirubin, Direct: 0.11 mg/dL (ref 0.00–0.40)
TOTAL PROTEIN: 6.8 g/dL (ref 6.0–8.5)

## 2015-11-01 LAB — MICROALBUMIN / CREATININE URINE RATIO
Creatinine, Urine: 52.3 mg/dL
MICROALB/CREAT RATIO: <5.7 mg/g creat (ref 0.0–30.0)
Microalbumin, Urine: <3 ug/mL

## 2015-11-01 LAB — NMR, LIPOPROFILE
Cholesterol: 143 mg/dL (ref 100–199)
HDL CHOLESTEROL BY NMR: 48 mg/dL (ref >39)
HDL Particle Number: 31.3 umol/L (ref >=30.5)
LDL PARTICLE NUMBER: 1181 nmol/L — AB (ref <1000)
LDL Size: 20.4 nm (ref >20.5)
LDL-C: 67 mg/dL (ref 0–99)
LP-IR Score: 62 — ABNORMAL HIGH (ref <=45)
Small LDL Particle Number: 615 nmol/L — ABNORMAL HIGH (ref <=527)
TRIGLYCERIDES BY NMR: 142 mg/dL (ref 0–149)

## 2015-11-01 LAB — VITAMIN D 25 HYDROXY (VIT D DEFICIENCY, FRACTURES): VIT D 25 HYDROXY: 45.5 ng/mL (ref 30.0–100.0)

## 2015-11-05 ENCOUNTER — Telehealth: Payer: Self-pay

## 2015-11-05 DIAGNOSIS — G894 Chronic pain syndrome: Secondary | ICD-10-CM | POA: Diagnosis not present

## 2015-11-05 DIAGNOSIS — M5090 Cervical disc disorder, unspecified, unspecified cervical region: Secondary | ICD-10-CM | POA: Diagnosis not present

## 2015-11-05 DIAGNOSIS — M4806 Spinal stenosis, lumbar region: Secondary | ICD-10-CM | POA: Diagnosis not present

## 2015-11-05 DIAGNOSIS — Z79891 Long term (current) use of opiate analgesic: Secondary | ICD-10-CM | POA: Diagnosis not present

## 2015-11-05 NOTE — Telephone Encounter (Signed)
Patient was informed of all lab results

## 2015-11-26 ENCOUNTER — Telehealth: Payer: Self-pay | Admitting: Pharmacist

## 2015-11-28 ENCOUNTER — Ambulatory Visit (INDEPENDENT_AMBULATORY_CARE_PROVIDER_SITE_OTHER): Payer: Medicare Other | Admitting: Pharmacist

## 2015-11-28 ENCOUNTER — Encounter: Payer: Self-pay | Admitting: Pharmacist

## 2015-11-28 VITALS — BP 112/68 | HR 70 | Ht 66.0 in | Wt 214.0 lb

## 2015-11-28 DIAGNOSIS — E785 Hyperlipidemia, unspecified: Secondary | ICD-10-CM

## 2015-11-28 DIAGNOSIS — E119 Type 2 diabetes mellitus without complications: Secondary | ICD-10-CM | POA: Diagnosis not present

## 2015-11-28 DIAGNOSIS — E1169 Type 2 diabetes mellitus with other specified complication: Secondary | ICD-10-CM

## 2015-11-28 MED ORDER — DULAGLUTIDE 1.5 MG/0.5ML ~~LOC~~ SOAJ: 1.5000 mg | SUBCUTANEOUS | 0 refills | Status: DC

## 2015-11-28 MED ORDER — PITAVASTATIN CALCIUM 2 MG PO TABS: 2.0000 mg | ORAL_TABLET | ORAL | 0 refills | Status: DC

## 2015-11-28 NOTE — Patient Instructions (Signed)
If we are unable to continue Trulicity until the end of the year we can try glimepiride tablets for blood glucose.  Diabetes and Standards of Medical Care   Diabetes is complicated. You may find that your diabetes team includes a dietitian, nurse, diabetes educator, eye doctor, and more. To help everyone know what is going on and to help you get the care you deserve, the following schedule of care was developed to help keep you on track. Below are the tests, exams, vaccines, medicines, education, and plans you will need.  Blood Glucose Goals Prior to meals = 80 - 130 Within 2 hours of the start of a meal = less than 180  HbA1c test (goal is less than 7.0% - your last value was 7.4%) This test shows how well you have controlled your glucose over the past 2 to 3 months. It is used to see if your diabetes management plan needs to be adjusted.   It is performed at least 2 times a year if you are meeting treatment goals.  It is performed 4 times a year if therapy has changed or if you are not meeting treatment goals.  Blood pressure test  This test is performed at every routine medical visit. The goal is less than 140/90 mmHg for most people, but 130/80 mmHg in some cases. Ask your health care provider about your goal.  Dental exam  Follow up with the dentist regularly.  Eye exam  If you are diagnosed with type 1 diabetes as a child, get an exam upon reaching the age of 72 years or older and have had diabetes for 3 to 5 years. Yearly eye exams are recommended after that initial eye exam.  If you are diagnosed with type 1 diabetes as an adult, get an exam within 5 years of diagnosis and then yearly.  If you are diagnosed with type 2 diabetes, get an exam as soon as possible after the diagnosis and then yearly.  Foot care exam  Visual foot exams are performed at every routine medical visit. The exams check for cuts, injuries, or other problems with the feet.  A comprehensive foot exam  should be done yearly. This includes visual inspection as well as assessing foot pulses and testing for loss of sensation.  Check your feet nightly for cuts, injuries, or other problems with your feet. Tell your health care provider if anything is not healing.  Kidney function test (urine microalbumin)  This test is performed once a year.  Type 1 diabetes: The first test is performed 5 years after diagnosis.  Type 2 diabetes: The first test is performed at the time of diagnosis.  A serum creatinine and estimated glomerular filtration rate (eGFR) test is done once a year to assess the level of chronic kidney disease (CKD), if present.  Lipid profile (cholesterol, HDL, LDL, triglycerides)  Performed every 5 years for most people.  The goal for LDL is less than 100 mg/dL. If you are at high risk, the goal is less than 70 mg/dL.  The goal for HDL is 40 mg/dL to 50 mg/dL for men and 50 mg/dL to 60 mg/dL for women. An HDL cholesterol of 60 mg/dL or higher gives some protection against heart disease.  The goal for triglycerides is less than 150 mg/dL.  Influenza vaccine, pneumococcal vaccine, and hepatitis B vaccine  The influenza vaccine is recommended yearly.  The pneumococcal vaccine is generally given once in a lifetime. However, there are some instances when another  vaccination is recommended. Check with your health care provider.  The hepatitis B vaccine is also recommended for adults with diabetes.  Diabetes self-management education  Education is recommended at diagnosis and ongoing as needed.  Treatment plan  Your treatment plan is reviewed at every medical visit.  Document Released: 01/18/2009 Document Revised: 11/23/2012 Document Reviewed: 08/23/2012 [facility] Patient Information 2014 [place].

## 2015-11-28 NOTE — Telephone Encounter (Signed)
Patient coming in for appointment today.

## 2015-11-28 NOTE — Progress Notes (Signed)
[name], female   DOB: February 22, 1946, 70 y.o.   MRN: [id number]   Subjective:   CHRISLYN Haas is a 70 y.o. married, white female who presents for follow up uncontrolled DM.    Patient saw her PCP about   4  weeks ago and A1c was down from 14% to 7.4%.  Over the last 3 months she has been taking metformin XR 571m 1 tablet daily and Trulicity 04.85IOweekly for BG.   She is very please with results from Trulicity but she is in medicare coverage gap and cost is a concern. 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   She has stopped Invokana due to cost over the last month and BG is a little higher than in June.  Current FBG average is 150's She has tried higher doses of metformin but cannot tolerate due to GI side effects.  Patient is checking BG 2 times daily    Diet - patient has made significant changes to diet.  She is following an "Atkins type" diet - no bread, potatoes or corn.  She has increased non starchy vegetables.  Weight is down about 5# since 04/2015.  [name] also concerned about cost of Livalo 274m- take every other day.  This is the only statin she has been able to tolerate.  Last Lipid panel was at goal.  Exercise is limiting by back pain.  Current Medications (verified) Outpatient Encounter Prescriptions as of 11/28/2015  Medication Sig  . ALPRAZolam (XANAX) 0.5 MG tablet Take 1 tablet (0.5 mg total) by mouth at bedtime as needed.  . ARIPiprazole (ABILIFY) 2 MG tablet Take 2 mg by mouth daily.  . Blood Glucose Monitoring Suppl DEVI Blood glucose meter One Touch Brand. Use to check BS up to BID. DX 250.02  . cholecalciferol (VITAMIN D) 1000 UNITS tablet Take 2,000 Units by mouth daily.   . Marland Kitchenonjugated estrogens (PREMARIN) vaginal cream Place 1 Applicatorful vaginally at bedtime as needed. Use q hs  . escitalopram (LEXAPRO) 20 MG tablet Take 1 tablet (20 mg total) by mouth daily.  . Marland Kitchensomeprazole (NEXIUM) 40  MG capsule TAKE 1 CAPSULE (40 MG TOTAL) BY MOUTH BID.  . Marland Kitchenzetimibe (ZETIA) 10 MG tablet Take 1 tablet (10 mg total) by mouth daily. (Patient taking differently: Take 5 mg by mouth daily. )  . gabapentin (NEURONTIN) 600 MG tablet Take 1 tablet (600 mg total) by mouth 4 (four) times daily.  . Glucose Blood (BLOOD GLUCOSE TEST STRIPS) STRP Use to check BG qid.  DX:  E11.9  . glucose monitoring kit (FREESTYLE) monitoring kit Please dispense whichever glucometer patient's insurance will cover.  Use to check BG up to twice.  DX: 250.02  . HYDROcodone-acetaminophen (NORCO) 7.5-325 MG per tablet Take 1 tablet by mouth daily as needed.   . Lancets 30G MISC Use to check BG twice daily.  Dx:  250.02  . lisinopril-hydrochlorothiazide (PRINZIDE,ZESTORETIC) 20-25 MG tablet Take 1 tablet by mouth daily.  . metFORMIN (GLUCOPHAGE-XR) 500 MG 24 hr tablet TAKE (1) TABLET DAILY WITH BREAKFAST.  . Marland Kitchenmega-3 Fatty Acids (FISH OIL) 1000 MG CAPS Take by mouth 2 (two) times daily.    . Glory RosebushELICA LANCETS 3327OISC Use to check BG up to BID.  DX: E11.9  . Pitavastatin Calcium 2 MG TABS Take 1 tablet (2 mg total) by mouth every other day.  . [DISCONTINUED] Dulaglutide (TRULICITY) 0.3.50GKX/3.8HWOPN  Inject 0.75 mg into the skin once a week.  . [DISCONTINUED] Pitavastatin Calcium 2 MG TABS Take 1 tablet (2 mg total) by mouth every other day.  . Dulaglutide (TRULICITY) 1.5 ZC/5.8IF SOPN Inject 1.5 mg into the skin once a week.  . [DISCONTINUED] ARIPiprazole (ABILIFY) 2 MG tablet Take 1 tablet (2 mg total) by mouth daily. (Patient not taking: Reported on 11/28/2015)   No facility-administered encounter medications on file as of 11/28/2015.     Allergies (verified) Codeine; Crestor [rosuvastatin calcium]; Erythromycin; Fenofibrate; Lyrica [pregabalin]; Penicillins; Statins; Sulfa antibiotics; and Zocor [simvastatin]   History: Past Medical History:  Diagnosis Date  . Arthritis    [place]   . Cancer ([place])   . Colon  polyps   . Depression   . Diabetes mellitus, type 2 ([place]) 06/2010  . Diverticulosis   . Esophageal stricture   . Fibromyalgia    [place]   . GERD (gastroesophageal reflux disease)   . Hemorrhoids   . Hiatal hernia   . Hyperlipidemia   . Hypertension   . Menopause   . Peripheral vascular insufficiency ([place])   . Vitamin D deficiency    Past Surgical History:  Procedure Laterality Date  . ABDOMINAL HYSTERECTOMY     partial and then total  . ANKLE SURGERY     right  . ELBOW SURGERY     left  . JOINT REPLACEMENT Bilateral    knee  . REFRACTIVE SURGERY  2003  . REPLACEMENT TOTAL KNEE Bilateral   . TOTAL KNEE ARTHROPLASTY     bilateral  . VESICOVAGINAL FISTULA CLOSURE W/ TAH  1981   Family History  Problem Relation Age of Onset  . Diabetes Mother   . Heart failure Mother   . Hypertension Mother   . Cirrhosis Father   . Cirrhosis Brother     Objective:    Today's Vitals   03/11/15 0853  BP: 130/70  Pulse: 77  Height: _0  (1.676 m)  Weight: 217 lb (98.431 kg)   Body mass index is 34.54 kg/m.   Assessment:    Type 2 DM - improved since started Trulicity Medication management - in Medicare Coverage Gap Obesity - weight has decreased by 5# since January 2017 HTN - controlled     Plan:    1.  Increase Trulicity to 0.2DX weekly - gave #4 samples      Continue metformin 563m 1 tablet qd.      Continue Livalo 240m- take 1 every other day      Continue Zetia 1059mo 1/2 tablet qd        Giving samples is not the ideal solution for cost of DM and cholesterol meds but patient is doing so well.  If in the future we are unable to provide samples the other option might be to consider glimepiride but concerned about weight gain that can occur with this.  2.  Continue with low CHO diet / low calorie - continued weight loss 3.  Add exercise as able - swimming or stationary bike.  4.  Continue to check BG up to 2 times daily.   5.  RTC in 8 weeks.     EckCherre RobinsharmD   11/28/2015

## 2015-12-16 ENCOUNTER — Telehealth: Payer: Self-pay | Admitting: Family Medicine

## 2015-12-16 NOTE — Telephone Encounter (Signed)
Left #2 boxes of Trulicity 1.5mg  samples for patient.  Patient notified.

## 2016-01-13 ENCOUNTER — Telehealth: Payer: Self-pay | Admitting: Pharmacist

## 2016-01-13 ENCOUNTER — Other Ambulatory Visit: Payer: Self-pay | Admitting: Family Medicine

## 2016-01-13 NOTE — Telephone Encounter (Signed)
Left #2 dose Trulicity 1.5mg  for patient to pick.

## 2016-01-29 ENCOUNTER — Ambulatory Visit (INDEPENDENT_AMBULATORY_CARE_PROVIDER_SITE_OTHER): Payer: Medicare Other

## 2016-01-29 ENCOUNTER — Telehealth: Payer: Self-pay | Admitting: Family Medicine

## 2016-01-29 DIAGNOSIS — Z23 Encounter for immunization: Secondary | ICD-10-CM | POA: Diagnosis not present

## 2016-01-29 MED ORDER — DULAGLUTIDE 1.5 MG/0.5ML ~~LOC~~ SOAJ: 1.5000 mg | SUBCUTANEOUS | 0 refills | Status: DC

## 2016-01-29 NOTE — Telephone Encounter (Signed)
Left #4 pens for patient of Trulicity 1.5mg  in refrig

## 2016-02-05 DIAGNOSIS — Z96653 Presence of artificial knee joint, bilateral: Secondary | ICD-10-CM | POA: Diagnosis not present

## 2016-02-05 DIAGNOSIS — Z96651 Presence of right artificial knee joint: Secondary | ICD-10-CM | POA: Diagnosis not present

## 2016-02-05 DIAGNOSIS — Z96652 Presence of left artificial knee joint: Secondary | ICD-10-CM | POA: Diagnosis not present

## 2016-02-05 DIAGNOSIS — Z471 Aftercare following joint replacement surgery: Secondary | ICD-10-CM | POA: Diagnosis not present

## 2016-02-07 ENCOUNTER — Other Ambulatory Visit: Payer: Self-pay | Admitting: *Deleted

## 2016-02-07 MED ORDER — PITAVASTATIN CALCIUM 2 MG PO TABS: 2.0000 mg | ORAL_TABLET | ORAL | 0 refills | Status: DC

## 2016-02-11 ENCOUNTER — Ambulatory Visit: Payer: Medicare Other | Admitting: Physical Therapy

## 2016-02-12 ENCOUNTER — Ambulatory Visit: Payer: Medicare Other | Attending: Physician Assistant | Admitting: Physical Therapy

## 2016-02-12 DIAGNOSIS — M25561 Pain in right knee: Secondary | ICD-10-CM | POA: Insufficient documentation

## 2016-02-12 DIAGNOSIS — M25562 Pain in left knee: Secondary | ICD-10-CM | POA: Insufficient documentation

## 2016-02-12 DIAGNOSIS — G8929 Other chronic pain: Secondary | ICD-10-CM | POA: Diagnosis not present

## 2016-02-12 NOTE — Therapy (Signed)
[facility] [place], [place], [address] Phone: [phone]   Fax:  [phone]  Physical Therapy Evaluation  Patient Details  Name: [name] MRN: [id number] Date of Birth: 13-Nov-1945 Referring Provider: Gerrit Halls PA-C.  Encounter Date: 02/12/2016      PT End of Session - 02/12/16 1635    Visit Number 1   Number of Visits 12   Date for PT Re-Evaluation 04/12/16   PT Start Time 0945   PT Stop Time 1034   PT Time Calculation (min) 49 min   Activity Tolerance Patient tolerated treatment well   Behavior During Therapy [facility] for tasks assessed/performed      Past Medical History:  Diagnosis Date  . Arthritis    [place]   . Cancer ([place])   . Colon polyps   . Depression   . Diabetes mellitus, type 2 ([place]) 06/2010  . Diverticulosis   . Esophageal stricture   . Fibromyalgia    [place]   . GERD (gastroesophageal reflux disease)   . Hemorrhoids   . Hiatal hernia   . Hyperlipidemia   . Hypertension   . Menopause   . Peripheral vascular insufficiency ([place])   . Vitamin D deficiency     Past Surgical History:  Procedure Laterality Date  . ABDOMINAL HYSTERECTOMY     partial and then total  . ANKLE SURGERY     right  . ELBOW SURGERY     left  . JOINT REPLACEMENT Bilateral    knee  . REFRACTIVE SURGERY  2003  . REPLACEMENT TOTAL KNEE Bilateral   . TOTAL KNEE ARTHROPLASTY     bilateral  . VESICOVAGINAL FISTULA CLOSURE W/ TAH  1981    There were no vitals filed for this visit.       Subjective Assessment - 02/12/16 1624    Subjective The patient presents to OPPT with c/o bilateral knee pain right > left.  The patient has had both knee replaced.  Her resting pain-level is a 6/10 today.  Sitting decreases her pain and walking and steps increase her pain.  The patient also has a long h/o lumbar pain and is a surgical candidate but she has no plans of having surgery.  She has had spinal injections but  they are no longer effective.     Pertinent History Bilateral TKA's.  H/O lumbar pain.   How long can you walk comfortably? Short distances.   Patient Stated Goals Reduce my pain and get around better.   Currently in Pain? Yes   Pain Score 6    Pain Orientation Right;Left   Pain Descriptors / Indicators Aching;Throbbing   Pain Type Chronic pain   Pain Onset More than a month ago   Pain Frequency Constant   Aggravating Factors  Please see above.   Pain Relieving Factors Please see above.            [facility] PT Assessment - 02/12/16 0001      Assessment   Medical Diagnosis Bilateral knee joint replacement.   Referring Provider Gerrit Halls PA-C.   Onset Date/Surgical Date --  Years.     Precautions   Precautions --  No ultrasound.   Required Braces or Orthoses --  Right knee neoprene brace with patellar orifice.     Restrictions   Weight Bearing Restrictions No     Balance Screen   Has the patient fallen in the past 6 months No   Has the patient had a  decrease in activity level because of a fear of falling?  Yes   Is the patient reluctant to leave their home because of a fear of falling?  No     Home Ecologist residence     Prior Function   Level of Independence Independent     Observation/Other Assessments-Edema    Edema Circumferential     Circumferential Edema   Circumferential - Right RT 1 cm > LT.     ROM / Strength   AROM / PROM / Strength AROM;Strength     AROM   Overall AROM Comments RT knee 0 to 105 degrees and LT 0 to 115 degrees.     Strength   Overall Strength Comments Bilateral LE strength is essentially normal.     Palpation   Palpation comment Tender to palpation over right quadriceps and patellar tendon and especially over the right Pes Anserine region.  Left knee complaints "in" knee.     Ambulation/Gait   Gait Comments Antalgic gait without assistive device.                   [facility] Adult PT  Treatment/Exercise - 02/12/16 0001      Modalities   Modalities Electrical Stimulation     Electrical Stimulation   Electrical Stimulation Location Right knee including Pes Anserine.   Electrical Stimulation Action IFC at 80-150 Hz at 100% scan x 20 minutes.                  PT Short Term Goals - 02/12/16 1656      PT SHORT TERM GOAL #1   Title STG's=LTG's.           PT Long Term Goals - 02/12/16 1656      PT LONG TERM GOAL #1   Title Independent with HEP.   Time 4   Period Weeks   Status New     PT LONG TERM GOAL #2   Title Increase right knee flexion to 110 degrees.   Time 4   Period Weeks   Status New     PT LONG TERM GOAL #3   Title Walk a community distance with pain not > 3-4/10.   Time 4   Period Weeks   Status New     PT LONG TERM GOAL #4   Title Perform a reciprocating stair with pain not > 3-4/10.   Time 4   Period Weeks   Status New               Plan - 02/12/16 1652    Clinical Impression Statement The patient presents with bilateral knee pain right > left.  She is tender over her rigth anterior knee and especially the Pes Anserine area.  She lacks flexion when compared to the left knee.   Rehab Potential Good   PT Frequency 3x / week   PT Duration 4 weeks   PT Treatment/Interventions ADLs/Self Care Home Management;Cryotherapy;Electrical Stimulation;Iontophoresis 4mg /ml Dexamethasone;Moist Heat;Therapeutic activities;Therapeutic exercise;Patient/family education;Passive range of motion;Manual techniques;Vasopneumatic Device   PT Next Visit Plan Will send re-cert with Iontophoresis (look under "other order" tab); Electrical stimulation; STW/M; Nustep.      Patient will benefit from skilled therapeutic intervention in order to improve the following deficits and impairments:  Abnormal gait, Decreased activity tolerance, Pain, Decreased range of motion  Visit Diagnosis: Chronic pain of right knee - Plan: PT plan of care  cert/re-cert  Chronic pain of left knee -  Plan: PT plan of care cert/re-cert      G-Codes - [id number]    Functional Assessment Tool Used Clinical judgement.   Functional Limitation Mobility: Walking and moving around   Mobility: Walking and Moving Around Current Status [id number]) At least 40 percent but less than 60 percent impaired, limited or restricted   Mobility: Walking and Moving Around Goal Status [phone]) At least 20 percent but less than 40 percent impaired, limited or restricted       Problem List Patient Active Problem List   Diagnosis Date Noted  . 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 Depression 02/28/2013  . GERD (gastroesophageal reflux disease) 02/28/2013  . Hiatal hernia with gastroesophageal reflux 02/28/2013  . Peripheral neuropathy ([place]) 02/28/2013  . DDD (degenerative disc disease), lumbosacral 06/23/2012  . Fibromyalgia syndrome 06/23/2012  . Essential hypertension, benign 06/23/2012  . Osteopenia 06/23/2012  . Diabetes ([place]) 06/23/2012  . Abnormal EKG 10/29/2010  . Obesity due to excess calories 10/29/2010  . 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 DYSPHAGIA 10/11/2009  . PERSONAL HISTORY OF COLONIC POLYPS 10/11/2009    APPLEGATE, [place] MPT 02/12/2016, 5:08 PM  [facility] [address] [place], [place], [address] Phone: [phone]   Fax:  [phone]  Name: [name] MRN: [id number] Date of Birth: [birth year]

## 2016-02-13 ENCOUNTER — Ambulatory Visit: Payer: Medicare Other | Admitting: *Deleted

## 2016-02-13 ENCOUNTER — Other Ambulatory Visit: Payer: Self-pay | Admitting: *Deleted

## 2016-02-13 MED ORDER — PITAVASTATIN CALCIUM 2 MG PO TABS: 2.0000 mg | ORAL_TABLET | ORAL | 1 refills | Status: DC

## 2016-02-14 ENCOUNTER — Encounter: Payer: Self-pay | Admitting: Physical Therapy

## 2016-02-14 ENCOUNTER — Ambulatory Visit: Payer: Medicare Other | Admitting: Physical Therapy

## 2016-02-14 DIAGNOSIS — G8929 Other chronic pain: Secondary | ICD-10-CM

## 2016-02-14 DIAGNOSIS — M25561 Pain in right knee: Secondary | ICD-10-CM | POA: Diagnosis not present

## 2016-02-14 DIAGNOSIS — M25562 Pain in left knee: Secondary | ICD-10-CM

## 2016-02-14 NOTE — Therapy (Signed)
[facility] [place], [place], [address] Phone: [phone]   Fax:  [phone]  Physical Therapy Treatment  Patient Details  Name: [name] MRN: [id number] Date of Birth: [birth year] Referring Provider: Gerrit Halls PA-C.  Encounter Date: 02/14/2016      PT End of Session - 02/14/16 0950    Visit Number 2   Number of Visits 12   Date for PT Re-Evaluation 04/12/16   PT Start Time 0946   PT Stop Time 1032   PT Time Calculation (min) 46 min   Activity Tolerance Patient tolerated treatment well   Behavior During Therapy [facility] for tasks assessed/performed      Past Medical History:  Diagnosis Date  . Arthritis    [place]   . Cancer ([place])   . Colon polyps   . Depression   . Diabetes mellitus, type 2 ([place]) 06/2010  . Diverticulosis   . Esophageal stricture   . Fibromyalgia    [place]   . GERD (gastroesophageal reflux disease)   . Hemorrhoids   . Hiatal hernia   . Hyperlipidemia   . Hypertension   . Menopause   . Peripheral vascular insufficiency ([place])   . Vitamin D deficiency     Past Surgical History:  Procedure Laterality Date  . ABDOMINAL HYSTERECTOMY     partial and then total  . ANKLE SURGERY     right  . ELBOW SURGERY     left  . JOINT REPLACEMENT Bilateral    knee  . REFRACTIVE SURGERY  2003  . REPLACEMENT TOTAL KNEE Bilateral   . TOTAL KNEE ARTHROPLASTY     bilateral  . VESICOVAGINAL FISTULA CLOSURE W/ TAH  1981    There were no vitals filed for this visit.      Subjective Assessment - 02/14/16 0946    Subjective Reports she has already rode her bike at home for 30 minutes. Reports that she thinks where she has not been able to do acitivities due to back pain that she just needs rehab. R patella pain reported by patient and descends into anterior shin.    Pertinent History Bilateral TKA's.  H/O lumbar pain.   How long can you walk comfortably? Short distances.   Currently in  Pain? Yes   Pain Score 7    Pain Location Knee   Pain Orientation Right;Left   Pain Type Chronic pain   Pain Onset More than a month ago            [facility] PT Assessment - 02/14/16 0001      Assessment   Medical Diagnosis Bilateral knee joint replacement.   Next MD Visit 03/2016     Restrictions   Weight Bearing Restrictions No                     [facility] Adult PT Treatment/Exercise - 02/14/16 0001      Exercises   Exercises Knee/Hip     Knee/Hip Exercises: Standing   Hip Abduction Stengthening;Both;2 sets;10 reps;Knee straight   Forward Step Up Both;2 sets;10 reps;Hand Hold: 2;Step Height: 6"     Knee/Hip Exercises: Seated   Long Arc Quad Strengthening;Both;2 sets;10 reps;Weights   Long Arc Quad Weight 3 lbs.   Clamshell with TheraBand Red  2x10 reps   Hamstring Curl Strengthening;Both;2 sets;10 reps   Hamstring Limitations Red theraband   Sit to Sand 20 reps;without UE support     Knee/Hip Exercises: Supine   Straight Leg  Raises Strengthening;Both;2 sets;10 reps     Modalities   Modalities Cryotherapy;Electrical Stimulation;Vasopneumatic     Cryotherapy   Number Minutes Cryotherapy 15 Minutes   Cryotherapy Location Knee  L knee   Type of Cryotherapy Ice pack     Electrical Stimulation   Electrical Stimulation Location B knee   Electrical Stimulation Action Pre-Mod   Electrical Stimulation Parameters 80-150 hz x15 min   Electrical Stimulation Goals Pain;Edema     Vasopneumatic   Number Minutes Vasopneumatic  15 minutes   Vasopnuematic Location  Knee  R knee   Vasopneumatic Pressure Medium   Vasopneumatic Temperature  34     Manual Therapy   Manual Therapy Soft tissue mobilization   Soft tissue mobilization STW to entire R knee to decrease pain and patellar mobilizations to promote proper mobility and decrease pain                  PT Short Term Goals - 02/12/16 1656      PT SHORT TERM GOAL #1   Title STG's=LTG's.            PT Long Term Goals - 02/12/16 1656      PT LONG TERM GOAL #1   Title Independent with HEP.   Time 4   Period Weeks   Status New     PT LONG TERM GOAL #2   Title Increase right knee flexion to 110 degrees.   Time 4   Period Weeks   Status New     PT LONG TERM GOAL #3   Title Walk a community distance with pain not > 3-4/10.   Time 4   Period Weeks   Status New     PT LONG TERM GOAL #4   Title Perform a reciprocating stair with pain not > 3-4/10.   Time 4   Period Weeks   Status New               Plan - 02/14/16 1104    Clinical Impression Statement Patient continues to present in clinic with reports of B knee with R > L. Patient limited in standing due to low back pain. Patient continues to be tender to palpation or mobilization to R patella at this time following step on step with pain following. Patient able to complete exercises although she reported discomfort or knee popping. R patella mobility limited upon assessment during manual therapy. Normal modalities response noted following removal of the modalities.   Rehab Potential Good   PT Frequency 3x / week   PT Duration 4 weeks   PT Treatment/Interventions ADLs/Self Care Home Management;Cryotherapy;Electrical Stimulation;Iontophoresis 4mg /ml Dexamethasone;Moist Heat;Therapeutic activities;Therapeutic exercise;Patient/family education;Passive range of motion;Manual techniques;Vasopneumatic Device   PT Next Visit Plan Will send re-cert with Iontophoresis (look under "other order" tab); Electrical stimulation; STW/M; Nustep.   Consulted and Agree with Plan of Care Patient      Patient will benefit from skilled therapeutic intervention in order to improve the following deficits and impairments:  Abnormal gait, Decreased activity tolerance, Pain, Decreased range of motion  Visit Diagnosis: Chronic pain of right knee  Chronic pain of left knee     Problem List Patient Active Problem List    Diagnosis Date Noted  . 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 Depression 02/28/2013  . GERD (gastroesophageal reflux disease) 02/28/2013  . Hiatal hernia with gastroesophageal reflux 02/28/2013  . Peripheral neuropathy ([place]) 02/28/2013  . DDD (degenerative disc disease), lumbosacral 06/23/2012  . Fibromyalgia syndrome 06/23/2012  .  Essential hypertension, benign 06/23/2012  . Osteopenia 06/23/2012  . Diabetes ([place]) 06/23/2012  . Abnormal EKG 10/29/2010  . Obesity due to excess calories 10/29/2010  . 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 DYSPHAGIA 10/11/2009  . PERSONAL HISTORY OF COLONIC POLYPS 10/11/2009    Wynelle Fanny, PTA 02/14/2016, 11:10 AM  [facility] [address] [place], [place], [address] Phone: [phone]   Fax:  [phone]  Name: [name] MRN: [id number] Date of Birth: 23-Apr-1945

## 2016-02-18 ENCOUNTER — Ambulatory Visit: Payer: Medicare Other | Admitting: *Deleted

## 2016-02-18 DIAGNOSIS — M25561 Pain in right knee: Secondary | ICD-10-CM | POA: Diagnosis not present

## 2016-02-18 DIAGNOSIS — M25562 Pain in left knee: Secondary | ICD-10-CM

## 2016-02-18 DIAGNOSIS — G8929 Other chronic pain: Secondary | ICD-10-CM

## 2016-02-18 NOTE — Therapy (Signed)
[facility] [place], [place], [address] Phone: [phone]   Fax:  [phone]  Physical Therapy Treatment  Patient Details  Name: [name] MRN: [id number] Date of Birth: 01-10-1946 Referring Provider: Gerrit Halls PA-C.  Encounter Date: 02/18/2016      PT End of Session - 02/18/16 0826    Visit Number 3   Number of Visits 12   Date for PT Re-Evaluation 04/12/16   PT Start Time 0815   PT Stop Time 0919   PT Time Calculation (min) 64 min      Past Medical History:  Diagnosis Date  . Arthritis    [place]   . Cancer ([place])   . Colon polyps   . Depression   . Diabetes mellitus, type 2 ([place]) 06/2010  . Diverticulosis   . Esophageal stricture   . Fibromyalgia    [place]   . GERD (gastroesophageal reflux disease)   . Hemorrhoids   . Hiatal hernia   . Hyperlipidemia   . Hypertension   . Menopause   . Peripheral vascular insufficiency ([place])   . Vitamin D deficiency     Past Surgical History:  Procedure Laterality Date  . ABDOMINAL HYSTERECTOMY     partial and then total  . ANKLE SURGERY     right  . ELBOW SURGERY     left  . JOINT REPLACEMENT Bilateral    knee  . REFRACTIVE SURGERY  2003  . REPLACEMENT TOTAL KNEE Bilateral   . TOTAL KNEE ARTHROPLASTY     bilateral  . VESICOVAGINAL FISTULA CLOSURE W/ TAH  1981    There were no vitals filed for this visit.      Subjective Assessment - 02/18/16 0824    Subjective RT knee continues to hurt around knee cap   Pertinent History Bilateral TKA's.  H/O lumbar pain.   How long can you walk comfortably? Short distances.   Patient Stated Goals Reduce my pain and get around better.   Currently in Pain? Yes   Pain Score 7    Pain Location Knee   Pain Orientation Right;Left   Pain Descriptors / Indicators Aching;Throbbing   Pain Type Chronic pain   Pain Onset More than a month ago   Pain Frequency Constant                          [facility] Adult PT Treatment/Exercise - 02/18/16 0001      Exercises   Exercises Knee/Hip     Knee/Hip Exercises: Aerobic   Nustep L4 x 15 mins seat 8     Knee/Hip Exercises: Standing   Forward Step Up Both;3 sets;10 reps;Step Height: 4"   Rocker Board 3 minutes  calf stretching     Knee/Hip Exercises: Seated   Long Arc Quad Strengthening;Both;10 reps;Weights;3 sets   [employer] Weight 3 lbs.     Modalities   Modalities Cryotherapy;Electrical Stimulation;Vasopneumatic     Cryotherapy   Number Minutes Cryotherapy 15 Minutes   Cryotherapy Location Knee   Type of Cryotherapy Ice pack     Electrical Stimulation   Electrical Stimulation Location B knee  IFC x 15 mins 80-150hz    Electrical Stimulation Goals Pain;Edema     Vasopneumatic   Number Minutes Vasopneumatic  15 minutes   Vasopnuematic Location  Knee   Vasopneumatic Pressure Medium   Vasopneumatic Temperature  34     Manual Therapy   Manual Therapy Soft tissue mobilization  Soft tissue mobilization STW to entire R and L  knee to decrease pain and patellar mobilizations to promote proper mobility and decrease pain                  PT Short Term Goals - 02/12/16 1656      PT SHORT TERM GOAL #1   Title STG's=LTG's.           PT Long Term Goals - 02/12/16 1656      PT LONG TERM GOAL #1   Title Independent with HEP.   Time 4   Period Weeks   Status New     PT LONG TERM GOAL #2   Title Increase right knee flexion to 110 degrees.   Time 4   Period Weeks   Status New     PT LONG TERM GOAL #3   Title Walk a community distance with pain not > 3-4/10.   Time 4   Period Weeks   Status New     PT LONG TERM GOAL #4   Title Perform a reciprocating stair with pain not > 3-4/10.   Time 4   Period Weeks   Status New               Plan - 02/18/16 0826    Clinical Impression Statement Pt did great today and was able to perform almost all exs without increased RT knee pain. She did  better with 4 inch step up insted of 6 inch due to less pain.She had patella tightness on both knees today that decreased after mobs.   Rehab Potential Good   PT Frequency 3x / week   PT Duration 4 weeks   PT Treatment/Interventions ADLs/Self Care Home Management;Cryotherapy;Electrical Stimulation;Iontophoresis 4mg /ml Dexamethasone;Moist Heat;Therapeutic activities;Therapeutic exercise;Patient/family education;Passive range of motion;Manual techniques;Vasopneumatic Device   PT Next Visit Plan Will send re-cert with Iontophoresis (look under "other order" tab); Electrical stimulation; STW/M; Nustep.   Consulted and Agree with Plan of Care Patient      Patient will benefit from skilled therapeutic intervention in order to improve the following deficits and impairments:  Abnormal gait, Decreased activity tolerance, Pain, Decreased range of motion  Visit Diagnosis: Chronic pain of right knee  Chronic pain of left knee     Problem List Patient Active Problem List   Diagnosis Date Noted  . 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[place]) 01/14/2015  . Depression 02/28/2013  . GERD (gastroesophageal reflux disease) 02/28/2013  . Hiatal hernia with gastroesophageal reflux 02/28/2013  . Peripheral neuropathy ([place]) 02/28/2013  . DDD (degenerative disc disease), lumbosacral 06/23/2012  . Fibromyalgia syndrome 06/23/2012  . Essential hypertension, benign 06/23/2012  . Osteopenia 06/23/2012  . Diabetes ([place]) 06/23/2012  . Abnormal EKG 10/29/2010  . Obesity due to excess calories 10/29/2010  . 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 DYSPHAGIA 10/11/2009  . PERSONAL HISTORY OF COLONIC POLYPS 10/11/2009    Remberto Lienhard,CHRIS, PTA 02/18/2016, 9:25 AM  [facility] [place], [place], [address] Phone: [phone]   Fax:  [phone]  Name: TASHAI FORTUN MRN: [id number] Date of Birth: [birth year]

## 2016-02-20 ENCOUNTER — Ambulatory Visit: Payer: Medicare Other | Admitting: Physical Therapy

## 2016-02-20 ENCOUNTER — Encounter: Payer: Self-pay | Admitting: Physical Therapy

## 2016-02-20 DIAGNOSIS — M25561 Pain in right knee: Principal | ICD-10-CM

## 2016-02-20 DIAGNOSIS — G8929 Other chronic pain: Secondary | ICD-10-CM

## 2016-02-20 DIAGNOSIS — M25562 Pain in left knee: Secondary | ICD-10-CM

## 2016-02-20 NOTE — Therapy (Signed)
[facility] [place], [place], [address] Phone: [phone]   Fax:  [phone]  Physical Therapy Treatment  Patient Details  Name: [name] MRN: [id number] Date of Birth: [birth year] Referring Provider: Gerrit Halls PA-C.  Encounter Date: 02/20/2016      PT End of Session - 02/20/16 0818    Visit Number 4   Number of Visits 12   Date for PT Re-Evaluation 04/12/16   PT Start Time 0818   PT Stop Time 0900   PT Time Calculation (min) 42 min   Activity Tolerance Patient tolerated treatment well   Behavior During Therapy [facility] for tasks assessed/performed      Past Medical History:  Diagnosis Date  . Arthritis    [place]   . Cancer ([place])   . Colon polyps   . Depression   . Diabetes mellitus, type 2 ([place]) 06/2010  . Diverticulosis   . Esophageal stricture   . Fibromyalgia    [place]   . GERD (gastroesophageal reflux disease)   . Hemorrhoids   . Hiatal hernia   . Hyperlipidemia   . Hypertension   . Menopause   . Peripheral vascular insufficiency ([place])   . Vitamin D deficiency     Past Surgical History:  Procedure Laterality Date  . ABDOMINAL HYSTERECTOMY     partial and then total  . ANKLE SURGERY     right  . ELBOW SURGERY     left  . JOINT REPLACEMENT Bilateral    knee  . REFRACTIVE SURGERY  2003  . REPLACEMENT TOTAL KNEE Bilateral   . TOTAL KNEE ARTHROPLASTY     bilateral  . VESICOVAGINAL FISTULA CLOSURE W/ TAH  1981    There were no vitals filed for this visit.      Subjective Assessment - 02/20/16 0817    Subjective Reports pain with going up and down stairs. Has already rode her bike this morning. Reports knee pain is a little better than it was previously.   Pertinent History Bilateral TKA's.  H/O lumbar pain.   How long can you walk comfortably? Short distances.   Patient Stated Goals Reduce my pain and get around better.   Currently in Pain? Yes   Pain Score 6    Pain  Location Knee   Pain Orientation Left;Right   Pain Descriptors / Indicators [place]   Pain Type Chronic pain   Pain Onset More than a month ago            [facility] PT Assessment - 02/20/16 0001      Assessment   Medical Diagnosis Bilateral knee joint replacement.   Next MD Visit 03/2016     Restrictions   Weight Bearing Restrictions No                     [facility] Adult PT Treatment/Exercise - 02/20/16 0001      Knee/Hip Exercises: Standing   Heel Raises Both;2 sets;10 reps   Heel Raises Limitations B toe raise x20 reps   Hip Flexion Stengthening;Both;2 sets;10 reps;Knee bent   Hip Flexion Limitations 2#   Terminal Knee Extension Limitations Green theraband x30 reps BLE   Hip Abduction Stengthening;Both;2 sets;10 reps;Knee straight   Abduction Limitations 2#   Forward Step Up Both;2 sets;10 reps;Hand Hold: 2;Step Height: 6"     Knee/Hip Exercises: Seated   Long Arc Quad Strengthening;Both;3 sets;10 reps;Weights   Long Arc Quad Weight 3 lbs.   Clamshell with TheraBand  Green  3x10 reps   Hamstring Curl Strengthening;Both;3 sets;10 reps   Hamstring Limitations Green theraband     Modalities   Modalities Cryotherapy;Electrical Stimulation;Vasopneumatic     Cryotherapy   Number Minutes Cryotherapy 15 Minutes   Cryotherapy Location Knee   Type of Cryotherapy Ice pack     Electrical Stimulation   Electrical Stimulation Location R knee   Electrical Stimulation Action IFC   Electrical Stimulation Parameters 1-10 hz x15 min   Electrical Stimulation Goals Pain;Edema     Vasopneumatic   Number Minutes Vasopneumatic  15 minutes   Vasopnuematic Location  Knee   Vasopneumatic Pressure Medium   Vasopneumatic Temperature  34                  PT Short Term Goals - 02/12/16 1656      PT SHORT TERM GOAL #1   Title STG's=LTG's.           PT Long Term Goals - 02/12/16 1656      PT LONG TERM GOAL #1   Title Independent with HEP.   Time 4   Period  Weeks   Status New     PT LONG TERM GOAL #2   Title Increase right knee flexion to 110 degrees.   Time 4   Period Weeks   Status New     PT LONG TERM GOAL #3   Title Walk a community distance with pain not > 3-4/10.   Time 4   Period Weeks   Status New     PT LONG TERM GOAL #4   Title Perform a reciprocating stair with pain not > 3-4/10.   Time 4   Period Weeks   Status New               Plan - 02/20/16 0853    Clinical Impression Statement Patient tolerated today's treatment well with continued reports of pain with stairs and with RLE better worse than LLE. Patient continues to report tenderness with palpation around R patella. Patient able to perform all therapeutic exercises without reports of increased pain and with fairly good technique. Normal modalities response noted following removal of the modalities. Patient presented in clinic with use of [employer] sleeve over R knee which she states helps her kneecap pain.   Rehab Potential Good   PT Frequency 3x / week   PT Duration 4 weeks   PT Treatment/Interventions ADLs/Self Care Home Management;Cryotherapy;Electrical Stimulation;Iontophoresis 4mg /ml Dexamethasone;Moist Heat;Therapeutic activities;Therapeutic exercise;Patient/family education;Passive range of motion;Manual techniques;Vasopneumatic Device   PT Next Visit Plan Continue BLE strengthening with modalities PRN as symptoms dictate per MPT POC.   Consulted and Agree with Plan of Care Patient      Patient will benefit from skilled therapeutic intervention in order to improve the following deficits and impairments:  Abnormal gait, Decreased activity tolerance, Pain, Decreased range of motion  Visit Diagnosis: Chronic pain of right knee  Chronic pain of left knee     Problem List Patient Active Problem List   Diagnosis Date Noted  . 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 Depression 02/28/2013  . GERD (gastroesophageal reflux disease)  02/28/2013  . Hiatal hernia with gastroesophageal reflux 02/28/2013  . Peripheral neuropathy ([place]) 02/28/2013  . DDD (degenerative disc disease), lumbosacral 06/23/2012  . Fibromyalgia syndrome 06/23/2012  . Essential hypertension, benign 06/23/2012  . Osteopenia 06/23/2012  . Diabetes ([place]) 06/23/2012  . Abnormal EKG 10/29/2010  . Obesity due to excess calories 10/29/2010  . OTHER NONTHROMBOCYTOPENIC PURPURAS  10/11/2009  . DYSPHAGIA 10/11/2009  . PERSONAL HISTORY OF COLONIC POLYPS 10/11/2009    Wynelle Fanny, PTA 02/20/2016, 9:11 AM  [facility] [address] [place], [place], [address] Phone: [phone]   Fax:  [phone]  Name: [name] MRN: [id number] Date of Birth: May 18, 1945

## 2016-02-25 ENCOUNTER — Encounter: Payer: Self-pay | Admitting: Physical Therapy

## 2016-02-25 ENCOUNTER — Ambulatory Visit: Payer: Medicare Other | Admitting: Physical Therapy

## 2016-02-25 DIAGNOSIS — G894 Chronic pain syndrome: Secondary | ICD-10-CM | POA: Diagnosis not present

## 2016-02-25 DIAGNOSIS — M47816 Spondylosis without myelopathy or radiculopathy, lumbar region: Secondary | ICD-10-CM | POA: Diagnosis not present

## 2016-02-25 DIAGNOSIS — M25562 Pain in left knee: Secondary | ICD-10-CM

## 2016-02-25 DIAGNOSIS — Z79891 Long term (current) use of opiate analgesic: Secondary | ICD-10-CM | POA: Diagnosis not present

## 2016-02-25 DIAGNOSIS — M25561 Pain in right knee: Secondary | ICD-10-CM | POA: Diagnosis not present

## 2016-02-25 DIAGNOSIS — M5090 Cervical disc disorder, unspecified, unspecified cervical region: Secondary | ICD-10-CM | POA: Diagnosis not present

## 2016-02-25 DIAGNOSIS — M48062 Spinal stenosis, lumbar region with neurogenic claudication: Secondary | ICD-10-CM | POA: Diagnosis not present

## 2016-02-25 DIAGNOSIS — G8929 Other chronic pain: Secondary | ICD-10-CM

## 2016-02-25 NOTE — Therapy (Signed)
[facility] [place], [place], [address] Phone: [phone]   Fax:  [phone]  Physical Therapy Treatment  Patient Details  Name: [name] MRN: [id number] Date of Birth: 10-07-1945 Referring Provider: Gerrit Halls PA-C.  Encounter Date: 02/25/2016      PT End of Session - 02/25/16 [id number]    Visit Number 5   Number of Visits 12   Date for PT Re-Evaluation 04/12/16   PT Start Time 0819   PT Stop Time 0911   PT Time Calculation (min) 52 min   Activity Tolerance Patient tolerated treatment well   Behavior During Therapy [facility] for tasks assessed/performed      Past Medical History:  Diagnosis Date  . Arthritis    [place]   . Cancer ([place])   . Colon polyps   . Depression   . Diabetes mellitus, type 2 ([place]) 06/2010  . Diverticulosis   . Esophageal stricture   . Fibromyalgia    [place]   . GERD (gastroesophageal reflux disease)   . Hemorrhoids   . Hiatal hernia   . Hyperlipidemia   . Hypertension   . Menopause   . Peripheral vascular insufficiency ([place])   . Vitamin D deficiency     Past Surgical History:  Procedure Laterality Date  . ABDOMINAL HYSTERECTOMY     partial and then total  . ANKLE SURGERY     right  . ELBOW SURGERY     left  . JOINT REPLACEMENT Bilateral    knee  . REFRACTIVE SURGERY  2003  . REPLACEMENT TOTAL KNEE Bilateral   . TOTAL KNEE ARTHROPLASTY     bilateral  . VESICOVAGINAL FISTULA CLOSURE W/ TAH  1981    There were no vitals filed for this visit.      Subjective Assessment - 02/25/16 0821    Subjective Reports that she has already been on her bike this morning. Reports that going down stairs still hurts at this time.    Pertinent History Bilateral TKA's.  H/O lumbar pain.   How long can you walk comfortably? Short distances.   Patient Stated Goals Reduce my pain and get around better.            [facility] PT Assessment - 02/25/16 0001      Assessment   Medical  Diagnosis Bilateral knee joint replacement.   Next MD Visit 03/2016     Restrictions   Weight Bearing Restrictions No                     [facility] Adult PT Treatment/Exercise - 02/25/16 0001      Knee/Hip Exercises: Standing   Hip Flexion Stengthening;Both;2 sets;10 reps;Knee bent   Hip Flexion Limitations 2#   Terminal Knee Extension Limitations Green theraband x30 reps BLE   Hip Abduction Stengthening;Both;2 sets;10 reps;Knee straight   Abduction Limitations 2#   Forward Step Up Both;2 sets;10 reps;Hand Hold: 2;Step Height: 6"   Rocker Board 3 minutes     Knee/Hip Exercises: Seated   Long Arc Quad Strengthening;Both;3 sets;10 reps;Weights   Long Arc Quad Weight 4 lbs.   Clamshell with TheraBand Green  3x10 reps   Hamstring Curl Strengthening;Both;3 sets;10 reps   Hamstring Limitations Green theraband   Sit to [employer] 20 reps;without UE support     Knee/Hip Exercises: Supine   Straight Leg Raises Strengthening;Both;2 sets;10 reps     Modalities   Modalities Cryotherapy;Electrical Stimulation;Vasopneumatic;Iontophoresis     Cryotherapy   Number  Minutes Cryotherapy 15 Minutes   Cryotherapy Location Knee   Type of Cryotherapy Ice pack     Electrical Stimulation   Electrical Stimulation Location R knee   Electrical Stimulation Action IFC   Electrical Stimulation Parameters 1-10 hz x15 min   Electrical Stimulation Goals Pain;Edema     Iontophoresis   Type of Iontophoresis Dexamethasone   Location R distolateral HS   Dose 1 ml   Time 8     Vasopneumatic   Number Minutes Vasopneumatic  15 minutes   Vasopnuematic Location  Knee   Vasopneumatic Pressure Medium   Vasopneumatic Temperature  46                PT Education - 02/25/16 0913    Education provided Yes   Education Details HEP- HS stretch in standing   Person(s) Educated Patient   Methods Explanation;Demonstration;Verbal cues   Comprehension Verbalized understanding;Returned  demonstration;Verbal cues required          PT Short Term Goals - 02/12/16 1656      PT SHORT TERM GOAL #1   Title STG's=LTG's.           PT Long Term Goals - 02/12/16 1656      PT LONG TERM GOAL #1   Title Independent with HEP.   Time 4   Period Weeks   Status New     PT LONG TERM GOAL #2   Title Increase right knee flexion to 110 degrees.   Time 4   Period Weeks   Status New     PT LONG TERM GOAL #3   Title Walk a community distance with pain not > 3-4/10.   Time 4   Period Weeks   Status New     PT LONG TERM GOAL #4   Title Perform a reciprocating stair with pain not > 3-4/10.   Time 4   Period Weeks   Status New               Plan - 02/25/16 0849    Clinical Impression Statement Patient continues to tolerate treatments fairly well although today she arrived with reports of posterior R knee discomfort especially with R seated HS curl with green theraband. Patient able to complete all other exercises well with resistance. Patient reported low back pain after standing exercises. Patient noting improvement with ascending stairs but still has difficulty with descending stairs. Normal modalities response noted following removal of the modalities. Patient educated regarding when to remove iontophoresis patch in four hours and given a handout for iontophoresis education. Patient also given VC and demonstration ceuing for HS stretch at home secondary to R HS tightness.   Rehab Potential Good   PT Frequency 3x / week   PT Duration 4 weeks   PT Treatment/Interventions ADLs/Self Care Home Management;Cryotherapy;Electrical Stimulation;Iontophoresis 4mg /ml Dexamethasone;Moist Heat;Therapeutic activities;Therapeutic exercise;Patient/family education;Passive range of motion;Manual techniques;Vasopneumatic Device   PT Next Visit Plan Continue BLE strengthening with modalities PRN as symptoms dictate per MPT POC. Iontophoresis order signed by Estill Bamberg on 02/24/2016.    PT Home Exercise Plan HEP- HS stretch in standing   Consulted and Agree with Plan of Care Patient      Patient will benefit from skilled therapeutic intervention in order to improve the following deficits and impairments:  Abnormal gait, Decreased activity tolerance, Pain, Decreased range of motion  Visit Diagnosis: Chronic pain of right knee  Chronic pain of left knee     Problem List Patient Active Problem List  Diagnosis Date Noted  . 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 Depression 02/28/2013  . GERD (gastroesophageal reflux disease) 02/28/2013  . Hiatal hernia with gastroesophageal reflux 02/28/2013  . Peripheral neuropathy ([place]) 02/28/2013  . DDD (degenerative disc disease), lumbosacral 06/23/2012  . Fibromyalgia syndrome 06/23/2012  . Essential hypertension, benign 06/23/2012  . Osteopenia 06/23/2012  . Diabetes ([place]) 06/23/2012  . Abnormal EKG 10/29/2010  . Obesity due to excess calories 10/29/2010  . 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 DYSPHAGIA 10/11/2009  . PERSONAL HISTORY OF COLONIC POLYPS 10/11/2009    Wynelle Fanny, PTA 02/25/2016, 9:17 AM  [facility] [address] [place], [place], [address] Phone: [phone]   Fax:  [phone]  Name: ALVERDA PELLISSIER MRN: [id number] Date of Birth: 12-31-1945

## 2016-03-02 ENCOUNTER — Telehealth: Payer: Self-pay | Admitting: Family Medicine

## 2016-03-02 NOTE — Telephone Encounter (Signed)
We didn't have Trulicity 1.5mg  - only 0.75mg  - #2 pens left for patient to pick up.

## 2016-03-03 ENCOUNTER — Ambulatory Visit: Payer: Medicare Other | Admitting: Physical Therapy

## 2016-03-03 ENCOUNTER — Encounter: Payer: Self-pay | Admitting: Physical Therapy

## 2016-03-03 DIAGNOSIS — G8929 Other chronic pain: Secondary | ICD-10-CM

## 2016-03-03 DIAGNOSIS — M25561 Pain in right knee: Principal | ICD-10-CM

## 2016-03-03 DIAGNOSIS — M25562 Pain in left knee: Secondary | ICD-10-CM

## 2016-03-03 NOTE — Therapy (Signed)
[facility] [place], [place], [address] Phone: [phone]   Fax:  [phone]  Physical Therapy Treatment  Patient Details  Name: [name] MRN: [id number] Date of Birth: 07/28/1945 Referring Provider: Gerrit Halls PA-C.  Encounter Date: 03/03/2016      PT End of Session - 03/03/16 0814    Visit Number 6   Number of Visits 12   Date for PT Re-Evaluation 04/12/16   PT Start Time 0815   PT Stop Time 0859   PT Time Calculation (min) 44 min   Activity Tolerance Patient tolerated treatment well   Behavior During Therapy [facility] for tasks assessed/performed      Past Medical History:  Diagnosis Date  . Arthritis    [place]   . Cancer ([place])   . Colon polyps   . Depression   . Diabetes mellitus, type 2 ([place]) 06/2010  . Diverticulosis   . Esophageal stricture   . Fibromyalgia    [place]   . GERD (gastroesophageal reflux disease)   . Hemorrhoids   . Hiatal hernia   . Hyperlipidemia   . Hypertension   . Menopause   . Peripheral vascular insufficiency ([place])   . Vitamin D deficiency     Past Surgical History:  Procedure Laterality Date  . ABDOMINAL HYSTERECTOMY     partial and then total  . ANKLE SURGERY     right  . ELBOW SURGERY     left  . JOINT REPLACEMENT Bilateral    knee  . REFRACTIVE SURGERY  2003  . REPLACEMENT TOTAL KNEE Bilateral   . TOTAL KNEE ARTHROPLASTY     bilateral  . VESICOVAGINAL FISTULA CLOSURE W/ TAH  1981    There were no vitals filed for this visit.      Subjective Assessment - 03/03/16 0814    Subjective Reports that iontophoresis patch did not stay well. Cotninues to report pain with going up and down stairs. Patient denied any redness or reaction with iontophoresis patch. Reports improvement in knees from starting exercising again.   Pertinent History Bilateral TKA's.  H/O lumbar pain.   How long can you walk comfortably? Short distances.   Patient Stated Goals  Reduce my pain and get around better.   Currently in Pain? No/denies            [facility] PT Assessment - 03/03/16 0001      Assessment   Medical Diagnosis Bilateral knee joint replacement.   Next MD Visit 03/2016     Restrictions   Weight Bearing Restrictions No                     [facility] Adult PT Treatment/Exercise - 03/03/16 0001      Knee/Hip Exercises: Standing   Heel Raises Both;2 sets;10 reps   Heel Raises Limitations B toe raise x20 reps   Hip Flexion Stengthening;Both;2 sets;10 reps;Knee bent   Hip Flexion Limitations 2#   Terminal Knee Extension Limitations Green theraband x30 reps BLE   Hip Abduction Stengthening;Both;2 sets;10 reps;Knee straight   Abduction Limitations 2#   Forward Step Up Both;2 sets;10 reps;Hand Hold: 2;Step Height: 6"   Rocker Board 3 minutes     Knee/Hip Exercises: Seated   Long Arc Quad Strengthening;Both;3 sets;10 reps;Weights   Long Arc Quad Weight 4 lbs.   Clamshell with TheraBand Green  3x10 reps   Hamstring Curl Strengthening;Both;3 sets;10 reps   Hamstring Limitations Green theraband   Sit to [employer] 20  reps;without UE support     Modalities   Modalities Cryotherapy;Electrical Stimulation;Vasopneumatic;Iontophoresis     Cryotherapy   Number Minutes Cryotherapy 15 Minutes   Cryotherapy Location Knee   Type of Cryotherapy Ice pack     Electrical Stimulation   Electrical Stimulation Location R knee   Electrical Stimulation Action IFC   Electrical Stimulation Parameters 1-10 hz x15 min   Electrical Stimulation Goals Pain;Edema     Iontophoresis   Type of Iontophoresis Dexamethasone   Location R medial knee   Dose 1 ml   Time 8     Vasopneumatic   Number Minutes Vasopneumatic  15 minutes   Vasopnuematic Location  Knee   Vasopneumatic Pressure Medium   Vasopneumatic Temperature  34                  PT Short Term Goals - 02/12/16 1656      PT SHORT TERM GOAL #1   Title STG's=LTG's.            PT Long Term Goals - 02/12/16 1656      PT LONG TERM GOAL #1   Title Independent with HEP.   Time 4   Period Weeks   Status New     PT LONG TERM GOAL #2   Title Increase right knee flexion to 110 degrees.   Time 4   Period Weeks   Status New     PT LONG TERM GOAL #3   Title Walk a community distance with pain not > 3-4/10.   Time 4   Period Weeks   Status New     PT LONG TERM GOAL #4   Title Perform a reciprocating stair with pain not > 3-4/10.   Time 4   Period Weeks   Status New               Plan - 03/03/16 0839    Clinical Impression Statement Patient continues to be able to tolerate treatments well as we are able to continue exercises with no reports of increased knee pain. Patient does rest in trunk flexion during standing exercises secondary to low back pain from prolonged standing. Normal modalities response noted following removal of the modalities. R mid knee placement for iontophoresis patch today with surgical taping to reinforce placement.   Rehab Potential Good   PT Frequency 3x / week   PT Duration 4 weeks   PT Treatment/Interventions ADLs/Self Care Home Management;Cryotherapy;Electrical Stimulation;Iontophoresis 4mg /ml Dexamethasone;Moist Heat;Therapeutic activities;Therapeutic exercise;Patient/family education;Passive range of motion;Manual techniques;Vasopneumatic Device   PT Next Visit Plan Continue BLE strengthening with modalities PRN as symptoms dictate per MPT POC. Iontophoresis order signed by Estill Bamberg on 02/24/2016.   PT Home Exercise Plan HEP- HS stretch in standing   Consulted and Agree with Plan of Care Patient      Patient will benefit from skilled therapeutic intervention in order to improve the following deficits and impairments:  Abnormal gait, Decreased activity tolerance, Pain, Decreased range of motion  Visit Diagnosis: Chronic pain of right knee  Chronic pain of left knee     Problem List Patient Active Problem List    Diagnosis Date Noted  . 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[place]) 01/14/2015  . Depression 02/28/2013  . GERD (gastroesophageal reflux disease) 02/28/2013  . Hiatal hernia with gastroesophageal reflux 02/28/2013  . Peripheral neuropathy ([place]) 02/28/2013  . DDD (degenerative disc disease), lumbosacral 06/23/2012  . Fibromyalgia syndrome 06/23/2012  . Essential hypertension, benign 06/23/2012  . Osteopenia 06/23/2012  . Diabetes (  [place]) 06/23/2012  . Abnormal EKG 10/29/2010  . Obesity due to excess calories 10/29/2010  . 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 DYSPHAGIA 10/11/2009  . PERSONAL HISTORY OF COLONIC POLYPS 10/11/2009    Wynelle Fanny, PTA 03/03/2016, 9:41 AM  [facility] [address] [place], [place], [address] Phone: [phone]   Fax:  [phone]  Name: [name] MRN: [id number] Date of Birth: 06-17-1945

## 2016-03-05 ENCOUNTER — Encounter: Payer: Self-pay | Admitting: Physical Therapy

## 2016-03-05 ENCOUNTER — Ambulatory Visit: Payer: Medicare Other | Admitting: Physical Therapy

## 2016-03-05 DIAGNOSIS — M25561 Pain in right knee: Secondary | ICD-10-CM | POA: Diagnosis not present

## 2016-03-05 DIAGNOSIS — G8929 Other chronic pain: Secondary | ICD-10-CM

## 2016-03-05 DIAGNOSIS — M25562 Pain in left knee: Secondary | ICD-10-CM

## 2016-03-05 NOTE — Patient Instructions (Addendum)
Terminal Knee Extension (Standing)    Facing anchor with right/left knee slightly bent and tubing just above knee, gently pull knee back straight. Do not overextend knee. Repeat _30___ times per set. Do __1__ sets per session. Do __1-2__ sessions per day.

## 2016-03-05 NOTE — Therapy (Signed)
[facility] [place], [place], [address] Phone: [phone]   Fax:  [phone]  Physical Therapy Treatment  Patient Details  Name: [name] MRN: [id number] Date of Birth: [birth year] Referring Provider: Gerrit Halls PA-C.  Encounter Date: 03/05/2016      PT End of Session - 03/05/16 0839    Visit Number 7   Number of Visits 12   Date for PT Re-Evaluation 04/12/16   PT Start Time 0815   PT Stop Time 0855   PT Time Calculation (min) 40 min   Activity Tolerance Patient tolerated treatment well   Behavior During Therapy [facility] for tasks assessed/performed      Past Medical History:  Diagnosis Date  . Arthritis    [place]   . Cancer ([place])   . Colon polyps   . Depression   . Diabetes mellitus, type 2 ([place]) 06/2010  . Diverticulosis   . Esophageal stricture   . Fibromyalgia    [place]   . GERD (gastroesophageal reflux disease)   . Hemorrhoids   . Hiatal hernia   . Hyperlipidemia   . Hypertension   . Menopause   . Peripheral vascular insufficiency ([place])   . Vitamin D deficiency     Past Surgical History:  Procedure Laterality Date  . ABDOMINAL HYSTERECTOMY     partial and then total  . ANKLE SURGERY     right  . ELBOW SURGERY     left  . JOINT REPLACEMENT Bilateral    knee  . REFRACTIVE SURGERY  2003  . REPLACEMENT TOTAL KNEE Bilateral   . TOTAL KNEE ARTHROPLASTY     bilateral  . VESICOVAGINAL FISTULA CLOSURE W/ TAH  1981    There were no vitals filed for this visit.      Subjective Assessment - 03/05/16 0818    Subjective Patient reported ongoing pain in bil knee's yet right is worse than left   Pertinent History Bilateral TKA's.  H/O lumbar pain.   How long can you walk comfortably? Short distances.   Patient Stated Goals Reduce my pain and get around better.   Currently in Pain? Yes   Pain Score 6    Pain Location Knee   Pain Orientation Right;Left   Pain Descriptors / Indicators  Sharp   Pain Type Chronic pain   Pain Onset More than a month ago   Pain Frequency Constant   Aggravating Factors  going down stairs   Pain Relieving Factors at rest                         [facility] Adult PT Treatment/Exercise - 03/05/16 0001      Knee/Hip Exercises: Machines for Strengthening   Cybex Knee Extension 10# 2x10   Cybex Knee Flexion 30# 3x10     Knee/Hip Exercises: Standing   Hip Flexion Stengthening;Both;2 sets;10 reps;Knee bent   Hip Flexion Limitations 2#   Terminal Knee Extension Limitations Green theraband x30 reps BLE   Hip Abduction Stengthening;Both;2 sets;10 reps;Knee straight   Abduction Limitations 2#   Forward Step Up Both;2 sets;10 reps;Hand Hold: 2;Step Height: 6"   Step Down Both;10 reps;1 set   Rocker Board 3 minutes     Knee/Hip Exercises: Seated   Clamshell with TheraBand Green  3x10     Knee/Hip Exercises: Supine   Straight Leg Raise with External Rotation Strengthening;Both;10 reps;3 sets     Iontophoresis   Type of Iontophoresis Dexamethasone  Location R medial knee   Dose 39m 3 of 6   Time 8                PT Education - 03/05/16 0824    Education Details HEP TKE with green t-band   Person(s) Educated Patient   Methods Explanation;Demonstration;Handout   Comprehension Verbalized understanding;Returned demonstration          PT Short Term Goals - 02/12/16 1656      PT SHORT TERM GOAL #1   Title STG's=LTG's.           PT Long Term Goals - 03/05/16 0825      PT LONG TERM GOAL #1   Title Independent with HEP.   Time 4   Period Weeks   Status On-going     PT LONG TERM GOAL #2   Title Increase right knee flexion to 110 degrees.   Time 4   Period Weeks   Status Achieved  AROM 120 degrees 03/05/16     PT LONG TERM GOAL #3   Title Walk a community distance with pain not > 3-4/10.   Time 4   Period Weeks   Status On-going     PT LONG TERM GOAL #4   Title Perform a reciprocating stair with  pain not > 3-4/10.   Time 4   Period Weeks   Status On-going  5/10 pain with stairs currently per patient 03/05/16               Plan - 03/05/16 0845    Clinical Impression Statement Patient progressing with all activities today. Patient tolerated treatment well and has reported less pain overall. Patient has improved right knee ROM today. Patient has increased pain in right knee and is worse with decsending stairs. Patients pain increases up to 5/10 with prolong standing or walking. Patient requested to skip the ice and ES today. Patient met LTG# 2 and others ongoing due to pain deficts.   Rehab Potential Good   PT Frequency 3x / week   PT Duration 4 weeks   PT Treatment/Interventions ADLs/Self Care Home Management;Cryotherapy;Electrical Stimulation;Iontophoresis 416mml Dexamethasone;Moist Heat;Therapeutic activities;Therapeutic exercise;Patient/family education;Passive range of motion;Manual techniques;Vasopneumatic Device   PT Next Visit Plan Continue BLE strengthening with modalities PRN as symptoms dictate per MPT POC   Consulted and Agree with Plan of Care Patient      Patient will benefit from skilled therapeutic intervention in order to improve the following deficits and impairments:  Abnormal gait, Decreased activity tolerance, Pain, Decreased range of motion  Visit Diagnosis: Chronic pain of right knee  Chronic pain of left knee     Problem List Patient Active Problem List   Diagnosis Date Noted  . 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[place]10/01/2015  . Depression 02/28/2013  . GERD (gastroesophageal reflux disease) 02/28/2013  . Hiatal hernia with gastroesophageal reflux 02/28/2013  . Peripheral neuropathy ([place]11/25/2014  . DDD (degenerative disc disease), lumbosacral 06/23/2012  . Fibromyalgia syndrome 06/23/2012  . Essential hypertension, benign 06/23/2012  . Osteopenia 06/23/2012  . Diabetes ([place]/20/2014  . Abnormal EKG 10/29/2010  . Obesity due to  excess calories 10/29/2010  . 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 DYSPHAGIA 10/11/2009  . PERSONAL HISTORY OF COLONIC POLYPS 10/11/2009    Karmyn Lowman P, PTA 03/05/2016, 9:01 AM  [id number]: [phone] Fax:  [phone]: [name]: [id number] of Birth: 8/July 08, 1945

## 2016-03-10 ENCOUNTER — Ambulatory Visit: Payer: Medicare Other | Attending: Physician Assistant | Admitting: Physical Therapy

## 2016-03-10 ENCOUNTER — Encounter: Payer: Self-pay | Admitting: Physical Therapy

## 2016-03-10 DIAGNOSIS — G8929 Other chronic pain: Secondary | ICD-10-CM | POA: Diagnosis not present

## 2016-03-10 DIAGNOSIS — M25561 Pain in right knee: Secondary | ICD-10-CM | POA: Diagnosis not present

## 2016-03-10 DIAGNOSIS — M25562 Pain in left knee: Secondary | ICD-10-CM | POA: Insufficient documentation

## 2016-03-10 NOTE — Therapy (Signed)
[facility] [place], [place], [address] Phone: [phone]   Fax:  [phone]  Physical Therapy Treatment  Patient Details  Name: [name] MRN: [id number] Date of Birth: 01/09/1946 Referring Provider: Gerrit Halls PA-C.  Encounter Date: 03/10/2016      PT End of Session - 03/10/16 0813    Visit Number 8   Number of Visits 12   Date for PT Re-Evaluation 04/12/16   PT Start Time 0812   PT Stop Time 0858   PT Time Calculation (min) 46 min   Activity Tolerance Patient tolerated treatment well   Behavior During Therapy [facility] for tasks assessed/performed      Past Medical History:  Diagnosis Date  . Arthritis    [place]   . Cancer ([place])   . Colon polyps   . Depression   . Diabetes mellitus, type 2 ([place]) 06/2010  . Diverticulosis   . Esophageal stricture   . Fibromyalgia    [place]   . GERD (gastroesophageal reflux disease)   . Hemorrhoids   . Hiatal hernia   . Hyperlipidemia   . Hypertension   . Menopause   . Peripheral vascular insufficiency ([place])   . Vitamin D deficiency     Past Surgical History:  Procedure Laterality Date  . ABDOMINAL HYSTERECTOMY     partial and then total  . ANKLE SURGERY     right  . ELBOW SURGERY     left  . JOINT REPLACEMENT Bilateral    knee  . REFRACTIVE SURGERY  2003  . REPLACEMENT TOTAL KNEE Bilateral   . TOTAL KNEE ARTHROPLASTY     bilateral  . VESICOVAGINAL FISTULA CLOSURE W/ TAH  1981    There were no vitals filed for this visit.      Subjective Assessment - 03/10/16 0812    Subjective Reports that knees are becoming less stiff. Reports that where iontophoresis patch was placed last time she had some breakout and red areas.   Pertinent History Bilateral TKA's.  H/O lumbar pain.   How long can you walk comfortably? Short distances.   Patient Stated Goals Reduce my pain and get around better.   Currently in Pain? Yes   Pain Score 6    Pain Location  Knee   Pain Orientation Right   Pain Type Chronic pain   Pain Onset More than a month ago   Pain Frequency Intermittent  Only with stairs   Aggravating Factors  Going up/down stairs            [facility] PT Assessment - 03/10/16 0001      Assessment   Medical Diagnosis Bilateral knee joint replacement.   Next MD Visit 03/2016     Restrictions   Weight Bearing Restrictions No                     [facility] Adult PT Treatment/Exercise - 03/10/16 0001      Knee/Hip Exercises: Machines for Strengthening   Cybex Knee Extension 10# 3x10 reps   Cybex Knee Flexion 30# 3x10 reps     Knee/Hip Exercises: Standing   Hip Flexion Stengthening;Both;2 sets;10 reps;Knee bent   Hip Flexion Limitations 2#   Terminal Knee Extension Limitations Pink XTS 3x10 reps BLE   Hip Abduction Stengthening;Both;2 sets;10 reps;Knee straight   Abduction Limitations 2#   Forward Step Up Both;2 sets;10 reps;Hand Hold: 2;Step Height: 6"   Step Down Both;2 sets;10 reps;Hand Hold: 2;Step Height: 4"  Rocker Board 3 minutes  Reports rockerboard really increases low back pain     Knee/Hip Exercises: Supine   Straight Leg Raises Strengthening;Both;2 sets;10 reps   Straight Leg Raise with External Rotation Strengthening;Both;2 sets;10 reps   Other Supine Knee/Hip Exercises B hip clamshell green theraband 3x10 reps     Modalities   Modalities Cryotherapy;Electrical Stimulation;Vasopneumatic     Cryotherapy   Number Minutes Cryotherapy 15 Minutes   Cryotherapy Location Knee   Type of Cryotherapy Ice pack     Electrical Stimulation   Electrical Stimulation Location R knee   Electrical Stimulation Action IFC   Electrical Stimulation Parameters 1-10 hz x15 min   Electrical Stimulation Goals Pain;Edema     Vasopneumatic   Number Minutes Vasopneumatic  15 minutes   Vasopnuematic Location  Knee   Vasopneumatic Pressure Medium   Vasopneumatic Temperature  56                  PT Short Term  Goals - 02/12/16 1656      PT SHORT TERM GOAL #1   Title STG's=LTG's.           PT Long Term Goals - 03/05/16 0825      PT LONG TERM GOAL #1   Title Independent with HEP.   Time 4   Period Weeks   Status On-going     PT LONG TERM GOAL #2   Title Increase right knee flexion to 110 degrees.   Time 4   Period Weeks   Status Achieved  AROM 120 degrees 03/05/16     PT LONG TERM GOAL #3   Title Walk a community distance with pain not > 3-4/10.   Time 4   Period Weeks   Status On-going     PT LONG TERM GOAL #4   Title Perform a reciprocating stair with pain not > 3-4/10.   Time 4   Period Weeks   Status On-going  5/10 pain with stairs currently per patient 03/05/16               Plan - 03/10/16 0850    Clinical Impression Statement Patient continues to progress with exercises and able to complete seated knee machine well with no reports of difficulty. Patient able to progress with TKE to pink xts use with no report of any knee irritation or discomfort. Patient required a seated rest break following rockerboard as she states that rockerboard causes her low back to hurt. Iontophoresis not completed today secondary to redness and breakout over region where last patch was placed. Normal modalities response noted following removal of the modalities.   Rehab Potential Good   PT Frequency 3x / week   PT Duration 4 weeks   PT Treatment/Interventions ADLs/Self Care Home Management;Cryotherapy;Electrical Stimulation;Iontophoresis 4mg /ml Dexamethasone;Moist Heat;Therapeutic activities;Therapeutic exercise;Patient/family education;Passive range of motion;Manual techniques;Vasopneumatic Device   PT Next Visit Plan Continue BLE strengthening with modalities PRN as symptoms dictate per MPT POC; Patient interested in trying step down at home so attempt step down to 6" step   PT Home Exercise Plan HEP- HS stretch in standing   Consulted and Agree with Plan of Care Patient       Patient will benefit from skilled therapeutic intervention in order to improve the following deficits and impairments:  Abnormal gait, Decreased activity tolerance, Pain, Decreased range of motion  Visit Diagnosis: Chronic pain of right knee  Chronic pain of left knee     Problem List Patient Active Problem List  Diagnosis Date Noted  . 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 Depression 02/28/2013  . GERD (gastroesophageal reflux disease) 02/28/2013  . Hiatal hernia with gastroesophageal reflux 02/28/2013  . Peripheral neuropathy ([place]) 02/28/2013  . DDD (degenerative disc disease), lumbosacral 06/23/2012  . Fibromyalgia syndrome 06/23/2012  . Essential hypertension, benign 06/23/2012  . Osteopenia 06/23/2012  . Diabetes ([place]) 06/23/2012  . Abnormal EKG 10/29/2010  . Obesity due to excess calories 10/29/2010  . 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 DYSPHAGIA 10/11/2009  . PERSONAL HISTORY OF COLONIC POLYPS 10/11/2009    Wynelle Fanny, PTA 03/10/2016, 9:03 AM  [facility] [address] [place], [place], [address] Phone: [phone]   Fax:  [phone]  Name: NAFEESA STRUVE MRN: [id number] Date of Birth: 1946/02/20

## 2016-03-11 ENCOUNTER — Encounter: Payer: Self-pay | Admitting: Physical Therapy

## 2016-03-11 ENCOUNTER — Ambulatory Visit: Payer: Medicare Other | Admitting: Physical Therapy

## 2016-03-11 DIAGNOSIS — M25562 Pain in left knee: Secondary | ICD-10-CM

## 2016-03-11 DIAGNOSIS — G8929 Other chronic pain: Secondary | ICD-10-CM

## 2016-03-11 DIAGNOSIS — M25561 Pain in right knee: Secondary | ICD-10-CM | POA: Diagnosis not present

## 2016-03-11 NOTE — Therapy (Signed)
[facility] [place], [place], [address] Phone: [phone]   Fax:  [phone]  Physical Therapy Treatment  Patient Details  Name: [name] MRN: [id number] Date of Birth: 1945-06-09 Referring Provider: Gerrit Halls PA-C.  Encounter Date: 03/11/2016      PT End of Session - 03/11/16 0920    Visit Number 9   Number of Visits 12   Date for PT Re-Evaluation 04/12/16   PT Start Time 0816   PT Stop Time 0903   PT Time Calculation (min) 47 min   Activity Tolerance Patient tolerated treatment well   Behavior During Therapy [facility] for tasks assessed/performed      Past Medical History:  Diagnosis Date  . Arthritis    [place]   . Cancer ([place])   . Colon polyps   . Depression   . Diabetes mellitus, type 2 ([place]) 06/2010  . Diverticulosis   . Esophageal stricture   . Fibromyalgia    [place]   . GERD (gastroesophageal reflux disease)   . Hemorrhoids   . Hiatal hernia   . Hyperlipidemia   . Hypertension   . Menopause   . Peripheral vascular insufficiency ([place])   . Vitamin D deficiency     Past Surgical History:  Procedure Laterality Date  . ABDOMINAL HYSTERECTOMY     partial and then total  . ANKLE SURGERY     right  . ELBOW SURGERY     left  . JOINT REPLACEMENT Bilateral    knee  . REFRACTIVE SURGERY  2003  . REPLACEMENT TOTAL KNEE Bilateral   . TOTAL KNEE ARTHROPLASTY     bilateral  . VESICOVAGINAL FISTULA CLOSURE W/ TAH  1981    There were no vitals filed for this visit.      Subjective Assessment - 03/11/16 0817    Subjective Patient reported some ongoig soreness   Pertinent History Bilateral TKA's.  H/O lumbar pain.   How long can you walk comfortably? Short distances.   Patient Stated Goals Reduce my pain and get around better.   Currently in Pain? Yes   Pain Score 5    Pain Location Knee   Pain Orientation Right   Pain Descriptors / Indicators Sharp   Pain Type Chronic pain   Pain  Onset More than a month ago   Pain Frequency Intermittent   Aggravating Factors  going up/down stairs   Pain Relieving Factors at rest                         [facility] Adult PT Treatment/Exercise - 03/11/16 0001      Knee/Hip Exercises: Machines for Strengthening   Cybex Knee Extension 10# 3x10 reps   Cybex Knee Flexion 30# 3x10 reps     Knee/Hip Exercises: Standing   Hip Flexion Stengthening;Both;2 sets;10 reps;Knee bent   Hip Flexion Limitations 3   Terminal Knee Extension Limitations Pink XTS 3x10 reps BLE   Hip Abduction Stengthening;Both;2 sets;10 reps;Knee straight   Abduction Limitations 3   Forward Step Up Both;2 sets;10 reps;Hand Hold: 2;Step Height: 6"   Step Down Both;2 sets;10 reps;Hand Hold: 2;Step Height: 6"   Rocker Board 2 minutes     Knee/Hip Exercises: Seated   Long Arc Quad Strengthening;Both;3 sets;10 reps;Weights   Long Arc Quad Weight 4 lbs.     Knee/Hip Exercises: Supine   Straight Leg Raises Strengthening;Both;2 sets;10 reps   Straight Leg Raise with External Rotation Strengthening;Both;2 sets;10  reps   Other Supine Knee/Hip Exercises B hip clamshell green theraband 3x10 reps     Cryotherapy   Number Minutes Cryotherapy 15 Minutes   Cryotherapy Location Knee  left   Type of Cryotherapy Ice pack     Electrical Stimulation   Electrical Stimulation Location R knee   Electrical Stimulation Action IFC   Electrical Stimulation Parameters 1-10hz  x104min   Electrical Stimulation Goals Pain;Edema     Vasopneumatic   Number Minutes Vasopneumatic  15 minutes   Vasopnuematic Location  Knee  right   Vasopneumatic Pressure Medium                  PT Short Term Goals - 02/12/16 1656      PT SHORT TERM GOAL #1   Title STG's=LTG's.           PT Long Term Goals - 03/05/16 0825      PT LONG TERM GOAL #1   Title Independent with HEP.   Time 4   Period Weeks   Status On-going     PT LONG TERM GOAL #2   Title Increase  right knee flexion to 110 degrees.   Time 4   Period Weeks   Status Achieved  AROM 120 degrees 03/05/16     PT LONG TERM GOAL #3   Title Walk a community distance with pain not > 3-4/10.   Time 4   Period Weeks   Status On-going     PT LONG TERM GOAL #4   Title Perform a reciprocating stair with pain not > 3-4/10.   Time 4   Period Weeks   Status On-going  5/10 pain with stairs currently per patient 03/05/16               Plan - 03/11/16 0924    Clinical Impression Statement Patient tolerated treatment well today and was able to complete all exercises with no reports of increased pain. Patient able to progrss to step downs on 6" step with no difficulty.  Patient has ongoing complaints of pain if going up or down stairs or prolong walking. Patients current goals progressing yet ongoing due to pain deficit.    Rehab Potential Good   PT Frequency 3x / week   PT Duration 4 weeks   PT Treatment/Interventions ADLs/Self Care Home Management;Cryotherapy;Electrical Stimulation;Iontophoresis 4mg /ml Dexamethasone;Moist Heat;Therapeutic activities;Therapeutic exercise;Patient/family education;Passive range of motion;Manual techniques;Vasopneumatic Device   PT Next Visit Plan Continue BLE strengthening with modalities PRN as symptoms dictate per MPT POC   Consulted and Agree with Plan of Care Patient      Patient will benefit from skilled therapeutic intervention in order to improve the following deficits and impairments:  Abnormal gait, Decreased activity tolerance, Pain, Decreased range of motion  Visit Diagnosis: Chronic pain of right knee  Chronic pain of left knee     Problem List Patient Active Problem List   Diagnosis Date Noted  . 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 Depression 02/28/2013  . GERD (gastroesophageal reflux disease) 02/28/2013  . Hiatal hernia with gastroesophageal reflux 02/28/2013  . Peripheral neuropathy ([place]) 02/28/2013  . DDD  (degenerative disc disease), lumbosacral 06/23/2012  . Fibromyalgia syndrome 06/23/2012  . Essential hypertension, benign 06/23/2012  . Osteopenia 06/23/2012  . Diabetes ([place]) 06/23/2012  . Abnormal EKG 10/29/2010  . Obesity due to excess calories 10/29/2010  . 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 DYSPHAGIA 10/11/2009  . PERSONAL HISTORY OF COLONIC POLYPS 10/11/2009    Magnum Lunde P,  PTA 03/11/2016, 9:31 AM  [facility] [place], [place], [address] Phone: [phone]   Fax:  [phone]  Name: HILDEGARDE EMPEY MRN: [id number] Date of Birth: 24-Jul-1945

## 2016-03-12 ENCOUNTER — Encounter: Payer: Medicare Other | Admitting: Physical Therapy

## 2016-03-17 ENCOUNTER — Ambulatory Visit: Payer: Medicare Other | Admitting: Physical Therapy

## 2016-03-17 ENCOUNTER — Telehealth: Payer: Self-pay | Admitting: Family Medicine

## 2016-03-17 ENCOUNTER — Encounter: Payer: Self-pay | Admitting: Physical Therapy

## 2016-03-17 DIAGNOSIS — M25561 Pain in right knee: Secondary | ICD-10-CM | POA: Diagnosis not present

## 2016-03-17 DIAGNOSIS — G8929 Other chronic pain: Secondary | ICD-10-CM

## 2016-03-17 DIAGNOSIS — M25562 Pain in left knee: Secondary | ICD-10-CM

## 2016-03-17 NOTE — Therapy (Addendum)
[facility] [place], [place], [address] Phone: [phone]   Fax:  [phone]  Physical Therapy Treatment  Patient Details  Name: [name] MRN: [id number] Date of Birth: Mar 31, 1946 Referring Provider: Gerrit Halls PA-C.  Encounter Date: 03/17/2016      PT End of Session - 03/17/16 0821    Visit Number 10   Number of Visits 12   Date for PT Re-Evaluation 04/12/16   PT Start Time 0810   PT Stop Time 0840   PT Time Calculation (min) 30 min   Activity Tolerance Patient tolerated treatment well   Behavior During Therapy [facility] for tasks assessed/performed      Past Medical History:  Diagnosis Date  . Arthritis    [place]   . Cancer ([place])   . Colon polyps   . Depression   . Diabetes mellitus, type 2 ([place]) 06/2010  . Diverticulosis   . Esophageal stricture   . Fibromyalgia    [place]   . GERD (gastroesophageal reflux disease)   . Hemorrhoids   . Hiatal hernia   . Hyperlipidemia   . Hypertension   . Menopause   . Peripheral vascular insufficiency ([place])   . Vitamin D deficiency     Past Surgical History:  Procedure Laterality Date  . ABDOMINAL HYSTERECTOMY     partial and then total  . ANKLE SURGERY     right  . ELBOW SURGERY     left  . JOINT REPLACEMENT Bilateral    knee  . REFRACTIVE SURGERY  2003  . REPLACEMENT TOTAL KNEE Bilateral   . TOTAL KNEE ARTHROPLASTY     bilateral  . VESICOVAGINAL FISTULA CLOSURE W/ TAH  1981    There were no vitals filed for this visit.      Subjective Assessment - 03/17/16 0820    Subjective Reports that she bought a TENS unit and that has really helped her knees. Reports that going up stairs is now a 2-3/10 instead of 8/10 and going down stairs is still 5/10.   Pertinent History Bilateral TKA's.  H/O lumbar pain.   How long can you walk comfortably? Short distances.   Patient Stated Goals Reduce my pain and get around better.   Currently in Pain?  No/denies            [facility] PT Assessment - 03/17/16 0001      Assessment   Medical Diagnosis Bilateral knee joint replacement.   Next MD Visit 03/2016     Restrictions   Weight Bearing Restrictions No                     [facility] Adult PT Treatment/Exercise - 03/17/16 0001      Knee/Hip Exercises: Machines for Strengthening   Cybex Knee Extension 10# 3x10 reps   Cybex Knee Flexion 30# 3x10 reps     Knee/Hip Exercises: Standing   Hip Flexion Stengthening;Both;2 sets;10 reps;Knee bent   Hip Flexion Limitations 3   Terminal Knee Extension Limitations Pink XTS 3x10 reps BLE   Hip Abduction Stengthening;Both;2 sets;10 reps;Knee straight   Abduction Limitations 3   Forward Step Up Both;3 sets;10 reps;Hand Hold: 2;Step Height: 6"   Step Down Both;2 sets;10 reps;Hand Hold: 2;Step Height: 6"     Knee/Hip Exercises: Supine   Bridges Limitations 3x10 reps   Straight Leg Raises Strengthening;Both;3 sets;10 reps   Straight Leg Raise with External Rotation Strengthening;Both;3 sets;10 reps   Other Supine Knee/Hip Exercises B hip  clamshell green theraband 3x10 reps   Other Supine Knee/Hip Exercises B hip adduction 2# x20 reps with 10 sec hold     Knee/Hip Exercises: Sidelying   Hip ABduction Strengthening;Both;3 sets;10 reps                  PT Short Term Goals - 02/12/16 1656      PT SHORT TERM GOAL #1   Title STG's=LTG's.           PT Long Term Goals - 03/17/16 0841      PT LONG TERM GOAL #1   Title Independent with HEP.   Time 4   Period Weeks   Status Achieved     PT LONG TERM GOAL #2   Title Increase right knee flexion to 110 degrees.   Time 4   Period Weeks   Status Achieved  AROM 120 degrees 03/05/16     PT LONG TERM GOAL #3   Title Walk a community distance with pain not > 3-4/10.   Time 4   Period Weeks   Status Achieved     PT LONG TERM GOAL #4   Title Perform a reciprocating stair with pain not > 3-4/10.   Time 4   Period  Weeks   Status Achieved               Plan - 03/17/16 0843    Clinical Impression Statement Patient progressed well in regards to LE strengthening with only complaints being of low back pain after standing exercises. Patient able to report improvements in regards to functional ability to tolerate more standing and stairs being less painful. Patient able to achieve all goals set at evaluation and denied any modalities as she has TENS unit now at home.    Rehab Potential Good   PT Frequency 3x / week   PT Duration 4 weeks   PT Treatment/Interventions ADLs/Self Care Home Management;Cryotherapy;Electrical Stimulation;Iontophoresis '4mg'$ /ml Dexamethasone;Moist Heat;Therapeutic activities;Therapeutic exercise;Patient/family education;Passive range of motion;Manual techniques;Vasopneumatic Device   PT Next Visit Plan Continue per MD discretion   PT Home Exercise Plan HEP- HS stretch in standing   Consulted and Agree with Plan of Care Patient      Patient will benefit from skilled therapeutic intervention in order to improve the following deficits and impairments:  Abnormal gait, Decreased activity tolerance, Pain, Decreased range of motion  Visit Diagnosis: Chronic pain of right knee  Chronic pain of left knee     Problem List Patient Active Problem List   Diagnosis Date Noted  . 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 Depression 02/28/2013  . GERD (gastroesophageal reflux disease) 02/28/2013  . Hiatal hernia with gastroesophageal reflux 02/28/2013  . Peripheral neuropathy ([place]) 02/28/2013  . DDD (degenerative disc disease), lumbosacral 06/23/2012  . Fibromyalgia syndrome 06/23/2012  . Essential hypertension, benign 06/23/2012  . Osteopenia 06/23/2012  . Diabetes ([place]) 06/23/2012  . Abnormal EKG 10/29/2010  . Obesity due to excess calories 10/29/2010  . 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 DYSPHAGIA 10/11/2009  . PERSONAL HISTORY OF COLONIC POLYPS  10/11/2009    Ahmed Prima, PTA 03/17/16 3:05 PM Mali Applegate MPT [facility] [address] [place], [place], [address] Phone: [phone]   Fax:  [phone]  Name: [name] MRN: [id number] Date of Birth: [birth year]   PHYSICAL THERAPY DISCHARGE SUMMARY  Visits from Start of Care: 10.  Current functional level related to goals / functional outcomes: See above.   Remaining deficits: Goals achieved.  Education / Equipment: HEP. Plan: Patient agrees to discharge.  Patient goals were met. Patient is being discharged due to meeting the stated rehab goals.  ?????        Mali Applegate MPT

## 2016-03-18 ENCOUNTER — Encounter: Payer: Self-pay | Admitting: Family Medicine

## 2016-03-18 ENCOUNTER — Ambulatory Visit (INDEPENDENT_AMBULATORY_CARE_PROVIDER_SITE_OTHER): Payer: Medicare Other

## 2016-03-18 ENCOUNTER — Ambulatory Visit (INDEPENDENT_AMBULATORY_CARE_PROVIDER_SITE_OTHER): Payer: Medicare Other | Admitting: Family Medicine

## 2016-03-18 VITALS — BP 125/68 | HR 80 | Temp 97.4°F | Ht 66.0 in | Wt 216.0 lb

## 2016-03-18 DIAGNOSIS — K219 Gastro-esophageal reflux disease without esophagitis: Secondary | ICD-10-CM

## 2016-03-18 DIAGNOSIS — G8929 Other chronic pain: Secondary | ICD-10-CM | POA: Diagnosis not present

## 2016-03-18 DIAGNOSIS — M79645 Pain in left finger(s): Secondary | ICD-10-CM

## 2016-03-18 DIAGNOSIS — E119 Type 2 diabetes mellitus without complications: Secondary | ICD-10-CM

## 2016-03-18 DIAGNOSIS — I10 Essential (primary) hypertension: Secondary | ICD-10-CM

## 2016-03-18 DIAGNOSIS — E1169 Type 2 diabetes mellitus with other specified complication: Secondary | ICD-10-CM | POA: Diagnosis not present

## 2016-03-18 DIAGNOSIS — E785 Hyperlipidemia, unspecified: Secondary | ICD-10-CM | POA: Diagnosis not present

## 2016-03-18 DIAGNOSIS — M797 Fibromyalgia: Secondary | ICD-10-CM

## 2016-03-18 DIAGNOSIS — E559 Vitamin D deficiency, unspecified: Secondary | ICD-10-CM

## 2016-03-18 DIAGNOSIS — M25532 Pain in left wrist: Secondary | ICD-10-CM

## 2016-03-18 LAB — BAYER DCA HB A1C WAIVED: HB A1C: 8.3 % — AB (ref <7.0)

## 2016-03-18 NOTE — Telephone Encounter (Signed)
Patient given #2 samples of trulicity 1.5mg 

## 2016-03-18 NOTE — Progress Notes (Signed)
Subjective:    [name], female    DOB: 1946/01/31, 69 y.o.   MRN: [id number]  HPI Pt here for follow up and management of chronic medical problems which includes diabetes, hypertension and hyperlipidemia. She is taking medications regularly.The patient's main complaint today is left thumb and hand pain which is been going on for couple months. There is no history of any injury. When she is writing or using her hand the pain is worse and it's mostly at the radiocarpal area of the left hand. The patient sees Dr. Leane Para for her chronic back pain and this has been stable. He writes her prescriptions for this. She denies any chest pain pressure or tightness. She does have occasionally some trouble swallowing but this is been going on for a good while and it is not any worse than usual. She also has occasional constipation which is most likely opioid-related but does not prefer to take any additional medication at this time for this problem. She denies any nausea vomiting diarrhea blood in the stool or black tarry bowel movements. Her last colonoscopy was in 2014. Her blood sugars at home and been running in the 170-185 range fasting. She is due to get lab work today and to return an FOBT and we will also get x-rays of her hand to evaluate this joint.    Patient Active Problem List   Diagnosis Date Noted  . 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 Depression 02/28/2013  . GERD (gastroesophageal reflux disease) 02/28/2013  . Hiatal hernia with gastroesophageal reflux 02/28/2013  . Peripheral neuropathy ([place]) 02/28/2013  . DDD (degenerative disc disease), lumbosacral 06/23/2012  . Fibromyalgia syndrome 06/23/2012  . Essential hypertension, benign 06/23/2012  . Osteopenia 06/23/2012  . Diabetes ([place]) 06/23/2012  . Abnormal EKG 10/29/2010  . Obesity due to excess calories 10/29/2010  . 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 DYSPHAGIA 10/11/2009  . PERSONAL  HISTORY OF COLONIC POLYPS 10/11/2009   Outpatient Encounter Prescriptions as of 03/18/2016  Medication Sig  . ALPRAZolam (XANAX) 0.5 MG tablet Take 1 tablet (0.5 mg total) by mouth at bedtime as needed.  . ARIPiprazole (ABILIFY) 2 MG tablet Take 2 mg by mouth daily.  . Blood Glucose Monitoring Suppl DEVI Blood glucose meter One Touch Brand. Use to check BS up to BID. DX 250.02  . cholecalciferol (VITAMIN D) 1000 UNITS tablet Take 2,000 Units by mouth daily.   Marland Kitchen conjugated estrogens (PREMARIN) vaginal cream Place 1 Applicatorful vaginally at bedtime as needed. Use q hs  . Dulaglutide (TRULICITY) 1.5 BL/3.9QZ SOPN Inject 1.5 mg into the skin once a week.  . escitalopram (LEXAPRO) 20 MG tablet Take 1 tablet (20 mg total) by mouth daily.  Marland Kitchen esomeprazole (NEXIUM) 40 MG capsule TAKE 1 CAPSULE (40 MG TOTAL) BY MOUTH BID.  Marland Kitchen ezetimibe (ZETIA) 10 MG tablet Take 1 tablet (10 mg total) by mouth daily. (Patient taking differently: Take 5 mg by mouth daily. )  . gabapentin (NEURONTIN) 600 MG tablet Take 1 tablet (600 mg total) by mouth 4 (four) times daily.  . Glucose Blood (BLOOD GLUCOSE TEST STRIPS) STRP Use to check BG qid.  DX:  E11.9  . glucose monitoring kit (FREESTYLE) monitoring kit Please dispense whichever glucometer patient's insurance will cover.  Use to check BG up to twice.  DX: 250.02  . HYDROcodone-acetaminophen (NORCO) 7.5-325 MG per tablet Take 1 tablet by mouth daily as needed.   . Lancets 30G MISC Use to check BG twice  daily.  Dx:  250.02  . lisinopril-hydrochlorothiazide (PRINZIDE,ZESTORETIC) 20-25 MG tablet Take 1 tablet by mouth daily.  . metFORMIN (GLUCOPHAGE-XR) 500 MG 24 hr tablet TAKE (1) TABLET DAILY WITH BREAKFAST.  Marland Kitchen Omega-3 Fatty Acids (FISH OIL) 1000 MG CAPS Take by mouth 2 (two) times daily.    [name] DELICA LANCETS 27P MISC CHECK BLOOD SUGAR UP TO TWICE DAILY AS DIRECTED  . Pitavastatin Calcium 2 MG TABS Take 1 tablet (2 mg total) by mouth every other day.   No  facility-administered encounter medications on file as of 03/18/2016.       Review of Systems  Constitutional: Negative.   HENT: Negative.   Eyes: Negative.   Respiratory: Negative.   Cardiovascular: Negative.   Gastrointestinal: Negative.   Endocrine: Negative.   Genitourinary: Negative.   Musculoskeletal: Positive for arthralgias (left hand / thumb pain).  Skin: Negative.   Allergic/Immunologic: Negative.   Neurological: Negative.   Hematological: Negative.   Psychiatric/Behavioral: Negative.        Objective:   Physical Exam  Constitutional: She is oriented to person, place, and time. She appears well-developed and well-nourished.  HENT:  Head: Normocephalic and atraumatic.  Right Ear: External ear normal.  Left Ear: External ear normal.  Nose: Nose normal.  Mouth/Throat: Oropharynx is clear and moist. No oropharyngeal exudate.  Eyes: Conjunctivae and EOM are normal. Pupils are equal, round, and reactive to light. Right eye exhibits no discharge. Left eye exhibits no discharge. No scleral icterus.  Neck: Normal range of motion. Neck supple. No JVD present. No thyromegaly present.  No bruits thyromegaly or anterior cervical adenopathy  Cardiovascular: Normal rate, regular rhythm, normal heart sounds and intact distal pulses.  Exam reveals no friction rub.   No murmur heard. Heart had a regular rate and rhythm at 60/m  Pulmonary/Chest: Effort normal and breath sounds normal. No respiratory distress. She has no wheezes. She has no rales.  Clear anteriorly and posteriorly  Abdominal: Soft. Bowel sounds are normal. She exhibits no mass. There is no tenderness. There is no rebound and no guarding.  Slight epigastric and right and left upper quadrant tenderness without masses or organ enlargement  Musculoskeletal: Normal range of motion. She exhibits no edema.  The patient is tender at the left first radiocarpal joint area. There is no swelling and no rash. The patient had good  mobility today. She was in good spirits and says that her chronic pain issues with joints and back and fibromyalgia seem to be stable at this point in time.  Lymphadenopathy:    She has no cervical adenopathy.  Neurological: She is alert and oriented to person, place, and time. She has normal reflexes. No cranial nerve deficit.  Skin: Skin is warm and dry. No rash noted.  Psychiatric: She has a normal mood and affect. Her behavior is normal. Judgment and thought content normal.  Nursing note and vitals reviewed.  BP 125/68 (BP Location: Left Arm)   Pulse 80   Temp 97.4 F (36.3 C) (Oral)   Ht 5' 6"  (1.676 m)   Wt 216 lb (98 kg)   BMI 34.86 kg/m   It is important to note that the patient has been seeing her psychiatrist who is going out of business. The patient is requesting that we refill her Lexapro and her     Assessment & Plan:  1. Hyperlipidemia associated with type 2 diabetes mellitus ([place]) -Continue current treatment pending results of lab work - CBC with Differential/Platelet - NMR,  lipoprofile  2. Type 2 diabetes mellitus without complication, without long-term current use of insulin ([place]) -Continue current treatment and as aggressive therapeutic lifestyle changes as possible and monitoring blood sugars regularly pending results of lab work - Bayer DCA Hb A1c Waived - CBC with Differential/Platelet - BMP8+EGFR  3. Vitamin D deficiency -Continue current treatment pending results of lab work - CBC with Differential/Platelet - VITAMIN D 25 Hydroxy (Vit-D Deficiency, Fractures)  4. Essential hypertension, benign -The blood pressure is good today and she will continue with current treatment - CBC with Differential/Platelet - BMP8+EGFR - Hepatic function panel  5. Fibromyalgia syndrome -The patient takes her pain medication from her orthopedic surgeon and she sees him about every 3-4 months. - CBC with Differential/Platelet  6. Gastroesophageal reflux disease,  esophagitis presence not specified -This seems to be stable currently and the patient was forewarned about starting the baby aspirin 81 mg 1 daily coded after the largest meal. She was told if her reflux symptoms increase to discontinue the aspirin and reduce the frequency to 3 times weekly after meals. - CBC with Differential/Platelet - Hepatic function panel  7. Chronic pain of left thumb -The patient is tender at the first radiocarpal joint of the left hand and this appears to be more arthritic related and tendon related. - DG Wrist Complete Left; Future  No orders of the defined types were placed in this encounter.  Patient Instructions                       Medicare Annual Wellness Visit  [facility] and the medical providers at [facility] strive to bring you the best medical care.  In doing so we not only want to address your current medical conditions and concerns but also to detect new conditions early and prevent illness, disease and health-related problems.    Medicare offers a yearly Wellness Visit which allows our clinical staff to assess your need for preventative services including immunizations, lifestyle education, counseling to decrease risk of preventable diseases and screening for fall risk and other medical concerns.    This visit is provided free of charge (no copay) for all Medicare recipients. The clinical pharmacists at [facility] have begun to conduct these Wellness Visits which will also include a thorough review of all your medications.    As you primary medical provider recommend that you make an appointment for your Annual Wellness Visit if you have not done so already this year.  You may set up this appointment before you leave today or you may call back ([phone]) and schedule an appointment.  Please make sure when you call that you mention that you are scheduling your Annual Wellness Visit with the clinical pharmacist  so that the appointment may be made for the proper length of time.     Continue current medications. Continue good therapeutic lifestyle changes which include good diet and exercise. Fall precautions discussed with patient. If an FOBT was given today- please return it to our front desk. If you are over 60 years old - you may need Prevnar 43 or the adult Pneumonia vaccine.  **Flu shots are available--- please call and schedule a FLU-CLINIC appointment**  After your visit with [place] today you will receive a survey in the mail or online from [employer] regarding your care with [place]. Please take a moment to fill this out. Your feedback is very important to [place] as you can help [place]  better understand your patient needs as well as improve your experience and satisfaction. WE CARE ABOUT YOU!!!   Start 1 baby aspirin coated once daily after the largest meal If this medicine biologist stomach discontinue it and restarted at 3 days weekly after the largest meal We will x-ray the wrist of the left hand and pending the outcome we will consider doing an injection at this joint Continue current medicines  Arrie Senate MD

## 2016-03-18 NOTE — Patient Instructions (Addendum)
Medicare Annual Wellness Visit  [facility] and the medical providers at [facility] strive to bring you the best medical care.  In doing so we not only want to address your current medical conditions and concerns but also to detect new conditions early and prevent illness, disease and health-related problems.    Medicare offers a yearly Wellness Visit which allows our clinical staff to assess your need for preventative services including immunizations, lifestyle education, counseling to decrease risk of preventable diseases and screening for fall risk and other medical concerns.    This visit is provided free of charge (no copay) for all Medicare recipients. The clinical pharmacists at [facility] have begun to conduct these Wellness Visits which will also include a thorough review of all your medications.    As you primary medical provider recommend that you make an appointment for your Annual Wellness Visit if you have not done so already this year.  You may set up this appointment before you leave today or you may call back [phone] and schedule an appointment.  Please make sure when you call that you mention that you are scheduling your Annual Wellness Visit with the clinical pharmacist so that the appointment may be made for the proper length of time.     Continue current medications. Continue good therapeutic lifestyle changes which include good diet and exercise. Fall precautions discussed with patient. If an FOBT was given today- please return it to our front desk. If you are over 46 years old - you may need Prevnar 60 or the adult Pneumonia vaccine.  **Flu shots are available--- please call and schedule a FLU-CLINIC appointment**  After your visit with [place] today you will receive a survey in the mail or online from [employer] regarding your care with [place]. Please take a moment to fill this out. Your feedback is very  important to [place] as you can help [place] better understand your patient needs as well as improve your experience and satisfaction. WE CARE ABOUT YOU!!!   Start 1 baby aspirin coated once daily after the largest meal If this medicine biologist stomach discontinue it and restarted at 3 days weekly after the largest meal We will x-ray the wrist of the left hand and pending the outcome we will consider doing an injection at this joint Continue current medicines Wear a wrist brace at nighttime to see if this helps the joint pain Use some warm wet compresses on the wrist when at home 20 minutes 3 or 4 times daily if possible

## 2016-03-19 ENCOUNTER — Ambulatory Visit: Payer: Medicare Other | Admitting: Physical Therapy

## 2016-03-19 LAB — CBC WITH DIFFERENTIAL/PLATELET
BASOS ABS: 0 10*3/uL (ref 0.0–0.2)
BASOS: 1 %
EOS (ABSOLUTE): 0.2 10*3/uL (ref 0.0–0.4)
Eos: 3 %
Hematocrit: 37.8 % (ref 34.0–46.6)
Hemoglobin: 12.5 g/dL (ref 11.1–15.9)
IMMATURE GRANS (ABS): 0 10*3/uL (ref 0.0–0.1)
Immature Granulocytes: 0 %
LYMPHS ABS: 2.5 10*3/uL (ref 0.7–3.1)
Lymphs: 42 %
MCH: 27.7 pg (ref 26.6–33.0)
MCHC: 33.1 g/dL (ref 31.5–35.7)
MCV: 84 fL (ref 79–97)
MONOS ABS: 0.8 10*3/uL (ref 0.1–0.9)
Monocytes: 14 %
NEUTROS ABS: 2.3 10*3/uL (ref 1.4–7.0)
Neutrophils: 40 %
PLATELETS: 259 10*3/uL (ref 150–379)
RBC: 4.51 x10E6/uL (ref 3.77–5.28)
RDW: 14.9 % (ref 12.3–15.4)
WBC: 5.9 10*3/uL (ref 3.4–10.8)

## 2016-03-19 LAB — BMP8+EGFR
BUN / CREAT RATIO: 13 (ref 12–28)
BUN: 10 mg/dL (ref 8–27)
CHLORIDE: 96 mmol/L (ref 96–106)
CO2: 25 mmol/L (ref 18–29)
Calcium: 9.6 mg/dL (ref 8.7–10.3)
Creatinine, Ser: 0.77 mg/dL (ref 0.57–1.00)
GFR calc non Af Amer: 78 mL/min/{1.73_m2} (ref >59)
GFR, EST AFRICAN AMERICAN: 90 mL/min/{1.73_m2} (ref >59)
GLUCOSE: 140 mg/dL — AB (ref 65–99)
Potassium: 5 mmol/L (ref 3.5–5.2)
SODIUM: 137 mmol/L (ref 134–144)

## 2016-03-19 LAB — HEPATIC FUNCTION PANEL
ALK PHOS: 82 IU/L (ref 39–117)
ALT: 35 IU/L — AB (ref 0–32)
AST: 41 IU/L — ABNORMAL HIGH (ref 0–40)
Albumin: 4.5 g/dL (ref 3.5–4.8)
BILIRUBIN, DIRECT: 0.1 mg/dL (ref 0.00–0.40)
Bilirubin Total: 0.3 mg/dL (ref 0.0–1.2)
TOTAL PROTEIN: 6.8 g/dL (ref 6.0–8.5)

## 2016-03-19 LAB — NMR, LIPOPROFILE
Cholesterol: 168 mg/dL (ref 100–199)
HDL Cholesterol by NMR: 50 mg/dL (ref >39)
HDL PARTICLE NUMBER: 35.9 umol/L (ref >=30.5)
LDL Particle Number: 1084 nmol/L — ABNORMAL HIGH (ref <1000)
LDL Size: 20.5 nm (ref >20.5)
LDL-C: 86 mg/dL (ref 0–99)
LP-IR SCORE: 69 — AB (ref <=45)
SMALL LDL PARTICLE NUMBER: 508 nmol/L (ref <=527)
Triglycerides by NMR: 161 mg/dL — ABNORMAL HIGH (ref 0–149)

## 2016-03-19 LAB — VITAMIN D 25 HYDROXY (VIT D DEFICIENCY, FRACTURES): VIT D 25 HYDROXY: 38.6 ng/mL (ref 30.0–100.0)

## 2016-03-19 NOTE — Addendum Note (Signed)
Addended by: Zannie Cove on: 03/19/2016 10:37 AM   Modules accepted: Orders

## 2016-03-23 ENCOUNTER — Encounter: Payer: Self-pay | Admitting: Pharmacist

## 2016-03-23 ENCOUNTER — Ambulatory Visit (INDEPENDENT_AMBULATORY_CARE_PROVIDER_SITE_OTHER): Payer: Medicare Other | Admitting: Pharmacist

## 2016-03-23 VITALS — BP 136/72 | HR 75 | Ht 66.0 in | Wt 216.5 lb

## 2016-03-23 DIAGNOSIS — I10 Essential (primary) hypertension: Secondary | ICD-10-CM

## 2016-03-23 DIAGNOSIS — Z6834 Body mass index (BMI) 34.0-34.9, adult: Secondary | ICD-10-CM

## 2016-03-23 DIAGNOSIS — E6609 Other obesity due to excess calories: Secondary | ICD-10-CM | POA: Diagnosis not present

## 2016-03-23 DIAGNOSIS — E119 Type 2 diabetes mellitus without complications: Secondary | ICD-10-CM | POA: Diagnosis not present

## 2016-03-23 MED ORDER — ASPIRIN EC 81 MG PO TBEC: 81.0000 mg | DELAYED_RELEASE_TABLET | Freq: Every day | ORAL | Status: DC

## 2016-03-23 MED ORDER — METFORMIN HCL ER 500 MG PO TB24: 1000.0000 mg | ORAL_TABLET | Freq: Every day | ORAL | 1 refills | Status: DC

## 2016-03-23 NOTE — Progress Notes (Signed)
[name], female   DOB: [birth year], 70 y.o.   MRN: [id number]   Subjective:   [name] is a 70 y.o. married, white female who presents for follow up uncontrolled DM.    Patient saw her PCP on 03/18/2016 and A1c has increased from 7.4% 09/10/15 to 8.3% (03/18/2016).   [name] is currently taking the following medications for diabetes:  Metformin XR 56m 1 tablet daily and Trulicity 06.62HUq week (sometimes gets 1.514mq week but it is not covered by her insurance so she uses with 0.75 or 1.34m77mepending on samples that we have.  She is she is in medicare coverage gap and cost is a concern. 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   She has taken Invokana in the past but stopped due to cost. We had noted in her chart that higher doses of metformin had cause diarrhea in past but [name] that current she is having more issues with constipation than loose stools.  Patient is checking BG 2 times daily Ranges form 150's to 190's   Diet - patient is trying following an "Atkins type" diet - no bread, potatoes or corn and is successful about 50% of time. She does eat out most meal.s Weight has been stable over the last 6 months.  [name] also concerned about cost of Livalo 2mg20mtake every other day.  This is the only statin she has been able to tolerate.  Last Lipid panel was good except Tg were increase - likely related to increase in BG  Exercise is limiting by back pain and fibroyalgia  Current Medications (verified) Outpatient Encounter Prescriptions as of 03/23/2016  Medication Sig  . ALPRAZolam (XANAX) 0.5 MG tablet Take 1 tablet (0.5 mg total) by mouth at bedtime as needed.  . ARIPiprazole (ABILIFY) 2 MG tablet Take 1 mg by mouth daily.   . Blood Glucose Monitoring Suppl DEVI Blood glucose meter One Touch Brand. Use to check BS up to BID. DX 250.02  . cholecalciferol (VITAMIN D) 1000 UNITS tablet  Take 2,000 Units by mouth daily.   . coMarland Kitchenjugated estrogens (PREMARIN) vaginal cream Place 1 Applicatorful vaginally at bedtime as needed. Use q hs  . Dulaglutide (TRULICITY) 1.5 MG/0TM/5.4YTN Inject 1.5 mg into the skin once a week.  . escitalopram (LEXAPRO) 20 MG tablet Take 1 tablet (20 mg total) by mouth daily.  . esMarland Kitchenmeprazole (NEXIUM) 40 MG capsule TAKE 1 CAPSULE (40 MG TOTAL) BY MOUTH BID.  . ezMarland Kitchentimibe (ZETIA) 10 MG tablet Take 1 tablet (10 mg total) by mouth daily. (Patient taking differently: Take 5 mg by mouth daily. )  . gabapentin (NEURONTIN) 600 MG tablet Take 1 tablet (600 mg total) by mouth 4 (four) times daily.  . Glucose Blood (BLOOD GLUCOSE TEST STRIPS) STRP Use to check BG qid.  DX:  E11.9  . glucose monitoring kit (FREESTYLE) monitoring kit Please dispense whichever glucometer patient's insurance will cover.  Use to check BG up to twice.  DX: 250.02  . HYDROcodone-acetaminophen (NORCO) 7.5-325 MG per tablet Take 1 tablet by mouth 2 (two) times daily as needed.   . Lancets 30G MISC Use to check BG twice daily.  Dx:  250.02  . lisinopril-hydrochlorothiazide (PRINZIDE,ZESTORETIC) 20-25 MG tablet Take 1 tablet by mouth daily.  . metFORMIN (GLUCOPHAGE-XR) 500 MG 24 hr tablet Take 2 tablets (1,000 mg total) by mouth daily with breakfast. TAKE (  1) TABLET DAILY WITH BREAKFAST.  Marland Kitchen Omega-3 Fatty Acids (FISH OIL) 1000 MG CAPS Take by mouth 2 (two) times daily.    [name] DELICA LANCETS 40N MISC CHECK BLOOD SUGAR UP TO TWICE DAILY AS DIRECTED  . Pitavastatin Calcium 2 MG TABS Take 1 tablet (2 mg total) by mouth every other day.  . [DISCONTINUED] metFORMIN (GLUCOPHAGE-XR) 500 MG 24 hr tablet TAKE (1) TABLET DAILY WITH BREAKFAST.  Marland Kitchen aspirin EC 81 MG tablet Take 1 tablet (81 mg total) by mouth daily.   No facility-administered encounter medications on file as of 03/23/2016.     Allergies (verified) Codeine; Crestor [rosuvastatin calcium]; Erythromycin; Fenofibrate; Lyrica [pregabalin];  Penicillins; Statins; Sulfa antibiotics; and Zocor [simvastatin]   History: Past Medical History:  Diagnosis Date  . Arthritis    [place]   . Cancer ([place])   . Colon polyps   . Depression   . Diabetes mellitus, type 2 ([place]) 06/2010  . Diverticulosis   . Esophageal stricture   . Fibromyalgia    [place]   . GERD (gastroesophageal reflux disease)   . Hemorrhoids   . Hiatal hernia   . Hyperlipidemia   . Hypertension   . Menopause   . Peripheral vascular insufficiency ([place])   . Vitamin D deficiency    Past Surgical History:  Procedure Laterality Date  . ABDOMINAL HYSTERECTOMY     partial and then total  . ANKLE SURGERY     right  . ELBOW SURGERY     left  . JOINT REPLACEMENT Bilateral    knee  . REFRACTIVE SURGERY  2003  . REPLACEMENT TOTAL KNEE Bilateral   . TOTAL KNEE ARTHROPLASTY     bilateral  . VESICOVAGINAL FISTULA CLOSURE W/ TAH  1981   Family History  Problem Relation Age of Onset  . Diabetes Mother   . Heart failure Mother   . Hypertension Mother   . Cirrhosis Father   . Cirrhosis Brother     Objective:    Today's Vitals   03/11/15 0853  BP: 130/70  Pulse: 77  Height: 5' 6"  (1.676 m)  Weight: 217 lb (98.431 kg)   Body mass index is 34.94 kg/m.   Assessment:    Type 2 DM - uncontrolled Medication management - in Medicare Coverage Gap Obesity - weight is stable HTN - controlled     Plan:    1.  Increase Trulicity to 0.2VO weekly - gave #2 samples. Checked formulary for 2018 and it appears that Victoza or Bydureon are preferred - tier 3. Plan to switch to either of these in 2018      Increase metformin XR 573m 2 tablets qd. Patient is reminded to monitor of loose stools or GI upset. To call office in any problems      Continue Livalo 246m- take 1 every other day      Continue Zetia 1064mo 1/2 tablet qd  2.  Continue with low CHO diet / low calorie diet. 3.  Add exercise as able - suggested swimming or stationary bike.  4.  Continue  to check BG up to 2 times daily.   5.  Reviewed patient's medications and formulary with her - it appears that other then Trulicity and Livalo that in 2018 her meds will be covered on insurance at tier 3 or lower and some meds such as metformin and linsinopril HCT will be zero copay. 6.  RTC in 4 weeks.     TamCherre RobinsharmD  03/23/2016        Patient ID: [name], female   DOB: [birth year], 70 y.o.   MRN: [id number]

## 2016-04-03 DIAGNOSIS — M180 Bilateral primary osteoarthritis of first carpometacarpal joints: Secondary | ICD-10-CM | POA: Diagnosis not present

## 2016-04-03 DIAGNOSIS — M79642 Pain in left hand: Secondary | ICD-10-CM | POA: Diagnosis not present

## 2016-04-03 DIAGNOSIS — R52 Pain, unspecified: Secondary | ICD-10-CM | POA: Diagnosis not present

## 2016-04-03 DIAGNOSIS — M19042 Primary osteoarthritis, left hand: Secondary | ICD-10-CM | POA: Diagnosis not present

## 2016-04-03 DIAGNOSIS — M79645 Pain in left finger(s): Secondary | ICD-10-CM | POA: Diagnosis not present

## 2016-04-16 ENCOUNTER — Telehealth: Payer: Self-pay | Admitting: Family Medicine

## 2016-04-16 MED ORDER — DULAGLUTIDE 1.5 MG/0.5ML ~~LOC~~ SOAJ: 1.5000 mg | SUBCUTANEOUS | 0 refills | Status: DC

## 2016-04-16 NOTE — Telephone Encounter (Signed)
LEFT #2 SAMPLES FOR PATIENT TO PICK UP.

## 2016-04-27 ENCOUNTER — Telehealth: Payer: Self-pay | Admitting: Pharmacist

## 2016-04-28 MED ORDER — DULAGLUTIDE 1.5 MG/0.5ML ~~LOC~~ SOAJ: 1.5000 mg | SUBCUTANEOUS | 0 refills | Status: DC

## 2016-04-28 NOTE — Telephone Encounter (Signed)
#  2 SAMPLES OF TRULICITY LEFT FOR PATIENT

## 2016-04-30 ENCOUNTER — Ambulatory Visit (INDEPENDENT_AMBULATORY_CARE_PROVIDER_SITE_OTHER): Payer: Medicare Other | Admitting: Pharmacist

## 2016-04-30 ENCOUNTER — Encounter: Payer: Self-pay | Admitting: Pharmacist

## 2016-04-30 VITALS — BP 110/60 | HR 80 | Ht 66.0 in | Wt 218.0 lb

## 2016-04-30 DIAGNOSIS — Z0000 Encounter for general adult medical examination without abnormal findings: Secondary | ICD-10-CM | POA: Diagnosis not present

## 2016-04-30 DIAGNOSIS — Z79899 Other long term (current) drug therapy: Secondary | ICD-10-CM

## 2016-04-30 MED ORDER — EXENATIDE ER 2 MG/0.85ML ~~LOC~~ AUIJ: 2.0000 mg | AUTO-INJECTOR | SUBCUTANEOUS | 0 refills | Status: DC

## 2016-04-30 MED ORDER — BLOOD GLUCOSE MONITOR KIT: PACK | 0 refills | Status: AC

## 2016-04-30 NOTE — Progress Notes (Signed)
[name], female   DOB: [birth year], 71 y.o.   MRN: [id number]   Subjective:   [name] is a 71 y.o. married, white female who presents for a Subsequent Medicare Annual Wellness Visit  She is pleasant and in NAD distress today.  She requests a new Rx for glucometer and testing supplies because the test strips for her current glucometer are not covered by her current Medicare plan. Reports FBG of 158 to 190 She does have chronic pain and neuropathy.  She take both hydrocodone/APAP and gabapentin for pain.  She feels that she gets adequate pain relief with these medications.  Current Medications (verified) Outpatient Encounter Prescriptions as of 04/30/2016  Medication Sig  . ALPRAZolam (XANAX) 0.5 MG tablet Take 1 tablet (0.5 mg total) by mouth at bedtime as needed.  . ARIPiprazole (ABILIFY) 2 MG tablet Take 1 mg by mouth daily.   . Blood Glucose Monitoring Suppl DEVI Blood glucose meter One Touch Brand. Use to check BS up to BID. DX 250.02  . cholecalciferol (VITAMIN D) 1000 UNITS tablet Take 2,000 Units by mouth daily.   Marland Kitchen conjugated estrogens (PREMARIN) vaginal cream Place 1 Applicatorful vaginally at bedtime as needed. Use q hs  . Dulaglutide (TRULICITY) 1.5 LG/9.2JJ SOPN Inject 1.5 mg into the skin once a week.  . escitalopram (LEXAPRO) 20 MG tablet Take 1 tablet (20 mg total) by mouth daily.  Marland Kitchen esomeprazole (NEXIUM) 40 MG capsule TAKE 1 CAPSULE (40 MG TOTAL) BY MOUTH BID.  Marland Kitchen ezetimibe (ZETIA) 10 MG tablet Take 1 tablet (10 mg total) by mouth daily. (Patient taking differently: Take 5 mg by mouth daily. )  . gabapentin (NEURONTIN) 600 MG tablet Take 1 tablet (600 mg total) by mouth 4 (four) times daily.  . Glucose Blood (BLOOD GLUCOSE TEST STRIPS) STRP Use to check BG qid.  DX:  E11.9  . glucose monitoring kit (FREESTYLE) monitoring kit Please dispense whichever glucometer patient's insurance will cover.  Use to check BG up to twice.  DX: 250.02  .  HYDROcodone-acetaminophen (NORCO) 7.5-325 MG per tablet Take 1 tablet by mouth 2 (two) times daily as needed.   . Lancets 30G MISC Use to check BG twice daily.  Dx:  250.02  . lisinopril-hydrochlorothiazide (PRINZIDE,ZESTORETIC) 20-25 MG tablet Take 1 tablet by mouth daily.  . metFORMIN (GLUCOPHAGE-XR) 500 MG 24 hr tablet Take 2 tablets (1,000 mg total) by mouth daily with breakfast. TAKE (1) TABLET DAILY WITH BREAKFAST.  Marland Kitchen Omega-3 Fatty Acids (FISH OIL) 1000 MG CAPS Take by mouth 2 (two) times daily.    [name] DELICA LANCETS 94R MISC CHECK BLOOD SUGAR UP TO TWICE DAILY AS DIRECTED  . Pitavastatin Calcium 2 MG TABS Take 1 tablet (2 mg total) by mouth every other day.  Marland Kitchen aspirin EC 81 MG tablet Take 1 tablet (81 mg total) by mouth daily. (Patient not taking: Reported on 04/30/2016)  . blood glucose meter kit and supplies KIT Dispense based on patient and insurance preference. Use up to two times daily as directed. (Dx: type 2 DM - E11.9)  . Exenatide ER (BYDUREON BCISE) 2 MG/0.85ML AUIJ Inject 2 mg into the skin once a week.   No facility-administered encounter medications on file as of 04/30/2016.     Checked tier for most of patient's prescription medications: .  ALPRAZolam (XANAX) 0.5 MG tablet - tier 3 .  ARIPiprazole (ABILIFY) 2 MG tablet,  - tier 4 .  conjugated estrogens (PREMARIN) vaginal cream - tier  3 (no cheaper formulary alternative found) .  Dulaglutide (TRULICITY) 1.5 XI/3.3AS SOPN - tier 5 (Bydureon is a tier 3) .  escitalopram (LEXAPRO) 20 MG tablet - tier 2 .  esomeprazole (NEXIUM) 40 MG capsule - not listed .  ezetimibe (ZETIA) 10 MG tablet - tier 3 .  gabapentin (NEURONTIN) 600 MG tablet - tier 2 .  lisinopril-hydrochlorothiazide (PRINZIDE,ZESTORETIC) 20-25 MG tablet - special tier 6 = zero to $3 copay .  metFORMIN (GLUCOPHAGE-XR) 500 MG 24 hr tablet - special tier 6 = zero to $3 copay .  Pitavastatin Calcium 2 MG TABS - tier 4  Allergies (verified) Codeine; Crestor  [rosuvastatin calcium]; Erythromycin; Fenofibrate; Lyrica [pregabalin]; Penicillins; Statins; Sulfa antibiotics; and Zocor [simvastatin]   History: Past Medical History:  Diagnosis Date  . Arthritis    [place]   . Cancer ([place])   . Colon polyps   . Depression   . Diabetes mellitus, type 2 ([place]) 06/2010  . Diverticulosis   . Esophageal stricture   . Fibromyalgia    [place]   . GERD (gastroesophageal reflux disease)   . Hemorrhoids   . Hiatal hernia   . Hyperlipidemia   . Hypertension   . Menopause   . Peripheral vascular insufficiency ([place])   . Vitamin D deficiency    Past Surgical History:  Procedure Laterality Date  . ABDOMINAL HYSTERECTOMY     partial and then total  . ANKLE SURGERY     right  . ELBOW SURGERY     left  . JOINT REPLACEMENT Bilateral    knee  . REFRACTIVE SURGERY  2003  . REPLACEMENT TOTAL KNEE Bilateral   . TOTAL KNEE ARTHROPLASTY     bilateral  . VESICOVAGINAL FISTULA CLOSURE W/ TAH  1981   Family History  Problem Relation Age of Onset  . Diabetes Mother   . Heart failure Mother   . Hypertension Mother   . Cirrhosis Father   . Cirrhosis Brother    Social History   Occupational History  . Retired    Social History Main Topics  . Smoking status: Former Smoker    Packs/day: 1.00    Years: 10.00    Types: Cigarettes    Start date: 11/07/1975    Quit date: 06/24/2010  . Smokeless tobacco: Never Used  . Alcohol use No  . Drug use: No  . Sexual activity: Yes    Do you feel safe at home?  Yes  Dietary issues and exercise activities: Current Exercise Habits: The patient does not participate in regular exercise at present, Exercise limited by: neurologic condition(s);orthopedic condition(s)  She was using her stationary bike more but that gotten out of the habit recently  Current Dietary habits:  Limits bread and potato intake.  Limits sugar intake.  3 meals daily    Objective:    Today's Vitals   03/11/15 0853  BP: 130/70    Pulse: 77  Height: 5' 6"  (1.676 m)  Weight: 217 lb (98.431 kg)  PainSc: 0-No pain - patient reports pain in leg and back when standing for extended periods of time.  She sees Dr Nelva Bush for pain management and occasional steroid injections.   Body mass index is 35.19 kg/m.   Last A1c = 8.3%  Activities of Daily Living In your present state of health, do you have any difficulty performing the following activities: 04/30/2016  Hearing? N  Vision? N  Difficulty concentrating or making decisions? N  Walking or climbing stairs? Y  Dressing or  bathing? N  Doing errands, shopping? N  Preparing Food and eating ? N  Using the Toilet? N  In the past six months, have you accidently leaked urine? N  Do you have problems with loss of bowel control? N  Managing your Medications? N  Managing your Finances? N  Housekeeping or managing your Housekeeping? Y  Some recent data might be hidden    Are there smokers in your home (other than you)? No   Cardiac Risk Factors include: advanced age (>50mn, >>92women);diabetes mellitus;dyslipidemia;sedentary lifestyle;obesity (BMI >30kg/m2);hypertension  Depression Screen PHQ 2/9 Scores 04/30/2016 03/18/2016 10/31/2015 09/10/2015  PHQ - 2 Score 2 0 1 1  PHQ- 9 Score 7 - - 3    Fall Risk Fall Risk  04/30/2016 03/18/2016 10/31/2015 06/05/2015 03/11/2015  Falls in the past year? No No No No No    Cognitive Function: MMSE - Mini Mental State Exam 04/30/2016 03/11/2015  Orientation to time 5 5  Orientation to Place 5 5  Registration 3 3  Attention/ Calculation 4 5  Recall 3 3  Language- name 2 objects 2 2  Language- repeat 1 1  Language- follow 3 step command 3 3  Language- read & follow direction 1 1  Write a sentence 1 1  Copy design 1 1  Total score 29 30    Immunizations and Health Maintenance Immunization History  Administered Date(s) Administered  . Influenza, High Dose Seasonal PF 01/29/2016  . Influenza,inj,Quad PF,36+ Mos 01/30/2013,  01/14/2015  . Influenza,inj,quad, With Preservative 01/04/2014  . Pneumococcal Conjugate-13 02/28/2013  . Pneumococcal Polysaccharide-23 02/05/2011  . Zoster 08/08/2009   Health Maintenance Due  Topic Date Due  . TETANUS/TDAP  04/06/2016    Patient Care Team: DChipper Herb MD as PCP - General (Family Medicine) JIrene Shipper MD as Consulting Physician (Gastroenterology) PSheralyn Boatman MD as Consulting Physician (Psychiatry) RSuella Broad MD as Consulting Physician (Physical Medicine and Rehabilitation) SMarica Otter OD as Consulting Physician (Optometry)  Indicate any recent Medical Services you may have received from other than [facility] providers in the past year (date may be approximate).    Assessment:    Annual Wellness Visit  Type 2 DM - last A1c was elevated Obesity - weight stable Medication management - reviewed all patient's medications and predicted copay / coverage for 2018 based on [place].   Screening Tests Health Maintenance  Topic Date Due  . TETANUS/TDAP  04/06/2016  . COLON CANCER SCREENING ANNUAL FOBT  06/04/2016 (Originally 01/15/2016)  . OPHTHALMOLOGY EXAM  06/05/2016  . HEMOGLOBIN A1C  09/16/2016  . FOOT EXAM  03/18/2017  . DEXA SCAN  04/06/2017  . MAMMOGRAM  09/08/2017  . COLONOSCOPY  11/22/2017  . INFLUENZA VACCINE  Completed  . ZOSTAVAX  Completed  . Hepatitis C Screening  Completed  . PNA vac Low Risk Adult  Completed        Plan:   During the course of the visit CEmonywas educated and counseled about the following appropriate screening and preventive services:   Vaccines to include Pneumoccal, Influenza, Hepatitis B, Td, Zostavax - due Tdap but patient declined due to cost of $62.47  Colorectal cancer screening - Colonoscopy UTD  Cardiovascular disease screening - BP at goal;  LDL at goal. Continue takin glivalo 263mqod and Zetia 50m56maily.    Diabetes  - discussed better dietary adherence.  Also gave patient  Bydureon to try since it is covered in her insurance.  If tolerate then  will D/C Trulicity and switch to Bydureon.  Urine albumin is UTD and WNL  Bone Denisty / Osteoporosis Screening - last checked 04/2015 - UTD  Mammogram - UTD  PAP - 2016 - UTD  Glaucoma screening / Diabetic Eye Exam - UTD, next due 06/2016  Nutrition counseling - Discussed continueing to limit portion sizes and high sugar / CHO foods.    Advanced Directives - UTD, patient will bring copy in future.   Exercise - restart riding stationary bike.  Also discussed [employer] - exercise specialist in [place] who might be a good resource for her to get exercise information  Patient has plenty of generic Nexium current - if in 2018 it Is not covered then can switch to either omeprazole 23m or pantoprazole 457mwhich are both tier 1 medications.  RTC in March 2018 to recheck DM    Patient Instructions (the written plan) were given to the patient.   TaCherre RobinsPharmD   04/30/2016

## 2016-04-30 NOTE — Patient Instructions (Addendum)
  [name] , Thank you for taking time to come for your Medicare Wellness Visit. I appreciate your ongoing commitment to your health goals. Please review the following plan we discussed and let me know if I can assist you in the future.   These are the goals we discussed:  Look for copy of [place] (important to know where these are kept - you can also bring copy to our office to be placed in our file / electronic chart)  Increase non-starchy vegetables - carrots, green bean, squash, zucchini, tomatoes, onions, peppers, spinach and other green leafy vegetables, cabbage, lettuce, cucumbers, asparagus, okra (not fried), eggplant Limit sugar and processed foods (cakes, cookies, ice cream, crackers and chips) Increase fresh fruit but limit serving sizes 1/2 cup or about the size of tennis or baseball Limit red meat to no more than 1-2 times per week (serving size about the size of your palm) Choose whole grains / lean proteins - whole wheat bread, quinoa, whole grain rice (1/2 cup), fish, chicken, [place] Avoid sugar and calorie containing beverages - soda, sweet tea and juice.  Choose water or unsweetened tea instead.    This is a list of the screening recommended for you and due dates:  Health Maintenance  Topic Date Due      . Tetanus Vaccine  04/06/2016 - cost $62.47   . Eye exam for diabetics  06/05/2016  . Hemoglobin A1C  09/16/2016  . Complete foot exam   03/18/2017  . DEXA scan (bone density measurement)  04/06/2017  . Mammogram  09/08/2017  . Colon Cancer Screening  11/22/2017  . Flu Shot  Completed  . Shingles Vaccine  Completed  .  Hepatitis C: One time screening is recommended by Center for Disease Control  (CDC) for  adults born from [birth year] through 1965.   Completed  . Pneumonia vaccines  Completed

## 2016-05-18 ENCOUNTER — Telehealth: Payer: Self-pay | Admitting: Family Medicine

## 2016-05-18 MED ORDER — EXENATIDE ER 2 MG/0.85ML ~~LOC~~ AUIJ: 2.0000 mg | AUTO-INJECTOR | SUBCUTANEOUS | 0 refills | Status: DC

## 2016-05-18 NOTE — Telephone Encounter (Signed)
#  3 pens of Bydureon left for patient

## 2016-05-26 ENCOUNTER — Telehealth: Payer: Self-pay | Admitting: Family Medicine

## 2016-05-26 MED ORDER — GLIMEPIRIDE 4 MG PO TABS: 4.0000 mg | ORAL_TABLET | Freq: Every day | ORAL | 2 refills | Status: DC

## 2016-05-26 NOTE — Telephone Encounter (Signed)
Started [name] 2 weeks ago.  Patient reports BG has been elevated since then.  BG has been in the 200-250. She requests something that is not expensive because she wants to avoid Medicare coverage gap.  Trulicity worked well in past but is not covered by insurance (bydureon and Victoza are preferred) Will try glimepiride 4mg  qam - patient is to call in 1 week if BG has not improved.

## 2016-06-08 ENCOUNTER — Telehealth: Payer: Self-pay | Admitting: Family Medicine

## 2016-06-08 NOTE — Telephone Encounter (Signed)
Pt aware.

## 2016-06-17 ENCOUNTER — Ambulatory Visit (INDEPENDENT_AMBULATORY_CARE_PROVIDER_SITE_OTHER): Payer: Medicare Other | Admitting: Pharmacist

## 2016-06-17 ENCOUNTER — Encounter: Payer: Self-pay | Admitting: Pharmacist

## 2016-06-17 VITALS — BP 106/62 | Ht 66.0 in | Wt 220.0 lb

## 2016-06-17 DIAGNOSIS — E871 Hypo-osmolality and hyponatremia: Secondary | ICD-10-CM

## 2016-06-17 DIAGNOSIS — E119 Type 2 diabetes mellitus without complications: Secondary | ICD-10-CM | POA: Diagnosis not present

## 2016-06-17 LAB — CMP14+EGFR
ALBUMIN: 4 g/dL (ref 3.5–4.8)
ALK PHOS: 78 IU/L (ref 39–117)
ALT: 41 IU/L — AB (ref 0–32)
AST: 49 IU/L — ABNORMAL HIGH (ref 0–40)
Albumin/Globulin Ratio: 1.4 (ref 1.2–2.2)
BUN / CREAT RATIO: 13 (ref 12–28)
BUN: 11 mg/dL (ref 8–27)
Bilirubin Total: 0.3 mg/dL (ref 0.0–1.2)
CO2: 24 mmol/L (ref 18–29)
CREATININE: 0.85 mg/dL (ref 0.57–1.00)
Calcium: 9.4 mg/dL (ref 8.7–10.3)
Chloride: 90 mmol/L — ABNORMAL LOW (ref 96–106)
GFR calc Af Amer: 80 mL/min/{1.73_m2} (ref >59)
GFR calc non Af Amer: 70 mL/min/{1.73_m2} (ref >59)
GLUCOSE: 257 mg/dL — AB (ref 65–99)
Globulin, Total: 2.9 g/dL (ref 1.5–4.5)
Potassium: 4.7 mmol/L (ref 3.5–5.2)
Sodium: 132 mmol/L — ABNORMAL LOW (ref 134–144)
TOTAL PROTEIN: 6.9 g/dL (ref 6.0–8.5)

## 2016-06-17 LAB — BAYER DCA HB A1C WAIVED: HB A1C (BAYER DCA - WAIVED): 10.1 % — ABNORMAL HIGH (ref <7.0)

## 2016-06-17 MED ORDER — GLIMEPIRIDE 4 MG PO TABS: 4.0000 mg | ORAL_TABLET | Freq: Every day | ORAL | 2 refills | Status: DC

## 2016-06-17 MED ORDER — DULAGLUTIDE 1.5 MG/0.5ML ~~LOC~~ SOAJ: 1.5000 mg | SUBCUTANEOUS | 0 refills | Status: DC

## 2016-06-17 NOTE — Progress Notes (Deleted)
[name], female   DOB: [birth year], 71 y.o.   MRN: [id number]   Subjective:    [name] is a 71 y.o. female who presents for an {initial/followup} evaluation of Type {NUMBERS; 1-2} diabetes mellitus.  Current symptoms/problems include {Symptoms; diabetes:14075} and have been {Desc; course:15616}. Symptoms have been present for {NUMBERS; 0-10:33138} {units:11}.  The patient was initially diagnosed with Type 2 diabetes mellitus based on the following criteria:  ***.  Known diabetic complications: {diabetes complications:1215} Cardiovascular risk factors: {cv risk factors:510} Current diabetic medications include {dm treatment:16755}.   Eye exam current (within one year): {yes/no/unknown:74} Weight trend: {trend:16658} Prior visit with CDE: {yes***/no:17258} Current diet: {diet habits:16563} Current exercise: {exercise types:16438} Medication Compliance?  {yes/no:20286}  Current monitoring regimen: {diabetes monitoring:1217} Home blood sugar records: {diabetes glucometry results:16657} Any episodes of hypoglycemia? {yes***/no:17258}  Is She on ACE inhibitor or angiotensin II receptor blocker?  {yes/no/not indicated:16556}  {ace inhibitors:16630} {angiotensin ii receptor blockers:16638}  {Common ambulatory SmartLinks:19316}    Objective:    There were no vitals taken for this visit.  Lab Review Glucose (mg/dL)  Date Value  03/18/2016 140 (H)  09/24/2015 129 (H)  09/10/2015 147 (H)   Glucose, Bld  Date Value  10/21/2012 162 mg/dL (H)  06/23/2012 128 mg/dL (H)  01/12/2008 119 (H)   CO2 (mmol/L)  Date Value  03/18/2016 25  09/24/2015 23  09/10/2015 24   BUN (mg/dL)  Date Value  03/18/2016 10  09/24/2015 16  09/10/2015 16   Creat (mg/dL)  Date Value  10/21/2012 0.68  06/23/2012 0.85   Creatinine, Ser (mg/dL)  Date Value  03/18/2016 0.77  09/24/2015 0.78  09/10/2015 0.81   {LABS ON RQSX:28208}    Assessment:    Diabetes Mellitus  type II, under {good/fair/poor:33178} control.    Plan:    1.  Rx changes: {none:33079} 2.  Education: Reviewed 'ABCs' of diabetes management (respective goals in parentheses):  A1C (<7), blood pressure (<130/80), and cholesterol (LDL <100). 3. Discussed pathophysiology of DM; difference between type 1 and type 2 DM. 4. CHO counting diet discussed.  Reviewed CHO amount in various foods and how to read nutrition labels.  Discussed recommended serving sizes.  5.  Recommend check BG {NUMBERS; 0-5:33138}  times a day 6.  Recommended increase physical activity - goal is 150 minutes per week 7. Follow up: {NUMBERS; 0-10:33138} {time:11}

## 2016-06-17 NOTE — Progress Notes (Signed)
[name], female   DOB: 06/15/1945, 71 y.o.   MRN: [id number]     Subjective:   [name] is a 72 y.o. married, white female who presents for follow up uncontrolled DM.    [name] is currently taking the following medications for diabetes:  Metformin XR 566m 2 tablets daily, glimepiride 436mqam and Bydureon 78m11meekly.  [name] that her BG has been higher over the last month since she switch from Trulicity to BydAmerican Standard CompaniesShe switched due to insurance coverage.   Patient is checking BG 2 times daily Ranges form 177  To 298. More 200's and 100's  Diet - patient continues to limit CHO intake - limiting bread and potatoes.  Increasing salads Weight is stable Exercise - none  Current Medications (verified) Outpatient Encounter Prescriptions as of 06/17/2016  Medication Sig  . ALPRAZolam (XANAX) 0.5 MG tablet Take 1 tablet (0.5 mg total) by mouth at bedtime as needed.  . ARIPiprazole (ABILIFY) 2 MG tablet Take 1 mg by mouth daily.   . aMarland Kitchenpirin EC 81 MG tablet Take 1 tablet (81 mg total) by mouth daily. (Patient not taking: Reported on 04/30/2016)  . blood glucose meter kit and supplies KIT Dispense based on patient and insurance preference. Use up to two times daily as directed. (Dx: type 2 DM - E11.9)  . Blood Glucose Monitoring Suppl DEVI Blood glucose meter One Touch Brand. Use to check BS up to BID. DX 250.02  . cholecalciferol (VITAMIN D) 1000 UNITS tablet Take 2,000 Units by mouth daily.   . cMarland Kitchennjugated estrogens (PREMARIN) vaginal cream Place 1 Applicatorful vaginally at bedtime as needed. Use q hs  . escitalopram (LEXAPRO) 20 MG tablet Take 1 tablet (20 mg total) by mouth daily.  . eMarland Kitchenomeprazole (NEXIUM) 40 MG capsule TAKE 1 CAPSULE (40 MG TOTAL) BY MOUTH BID.  . EMarland Kitchenenatide ER (BYDUREON BCISE) 2 MG/0.85ML AUIJ Inject 2 mg into the skin once a week.  . ezetimibe (ZETIA) 10 MG tablet Take 1 tablet (10 mg total) by mouth daily. (Patient taking differently: Take  5 mg by mouth daily. )  . gabapentin (NEURONTIN) 600 MG tablet Take 1 tablet (600 mg total) by mouth 4 (four) times daily.  . gMarland Kitchenimepiride (AMARYL) 4 MG tablet Take 1 tablet (4 mg total) by mouth daily before breakfast.  . Glucose Blood (BLOOD GLUCOSE TEST STRIPS) STRP Use to check BG qid.  DX:  E11.9  . glucose monitoring kit (FREESTYLE) monitoring kit Please dispense whichever glucometer patient's insurance will cover.  Use to check BG up to twice.  DX: 250.02  . HYDROcodone-acetaminophen (NORCO) 7.5-325 MG per tablet Take 1 tablet by mouth 2 (two) times daily as needed.   . Lancets 30G MISC Use to check BG twice daily.  Dx:  250.02  . lisinopril-hydrochlorothiazide (PRINZIDE,ZESTORETIC) 20-25 MG tablet Take 1 tablet by mouth daily.  . metFORMIN (GLUCOPHAGE-XR) 500 MG 24 hr tablet Take 2 tablets (1,000 mg total) by mouth daily with breakfast. TAKE (1) TABLET DAILY WITH BREAKFAST.  . OMarland [place]-3 Fatty Acids (FISH OIL) 1000 MG CAPS Take by mouth 2 (two) times daily.    . OGlory RosebushLICA LANCETS [id number] CHECK BLOOD SUGAR UP TO TWICE DAILY AS DIRECTED  . Pitavastatin Calcium 2 MG TABS Take 1 tablet (2 mg total) by mouth every other day.   No facility-administered encounter medications on file as of 06/17/2016.     Allergies (verified) Codeine; Crestor [rosuvastatin calcium]; Erythromycin; Fenofibrate; Lyrica [  pregabalin]; Penicillins; Statins; Sulfa antibiotics; and Zocor [simvastatin]   History: Past Medical History:  Diagnosis Date  . Arthritis    [place]   . Cancer ([place])   . Colon polyps   . Depression   . Diabetes mellitus, type 2 ([place]) 06/2010  . Diverticulosis   . Esophageal stricture   . Fibromyalgia    [place]   . GERD (gastroesophageal reflux disease)   . Hemorrhoids   . Hiatal hernia   . Hyperlipidemia   . Hypertension   . Menopause   . Peripheral vascular insufficiency ([place])   . Vitamin D deficiency    Past Surgical History:  Procedure Laterality Date  . ABDOMINAL  HYSTERECTOMY     partial and then total  . ANKLE SURGERY     right  . ELBOW SURGERY     left  . JOINT REPLACEMENT Bilateral    knee  . REFRACTIVE SURGERY  2003  . REPLACEMENT TOTAL KNEE Bilateral   . TOTAL KNEE ARTHROPLASTY     bilateral  . VESICOVAGINAL FISTULA CLOSURE W/ TAH  1981   Family History  Problem Relation Age of Onset  . Diabetes Mother   . Heart failure Mother   . Hypertension Mother   . Cirrhosis Father   . Cirrhosis Brother     Objective:    Today's Vitals   03/11/15 0853  BP: 130/70  Pulse: 77  Height: 5' 6"  (1.676 m)  Weight: 217 lb (98.431 kg)   Body mass index is 35.51 kg/m.   A1c was 10.1% today   Assessment:    Type 2 DM - uncontrolled Obesity - weight is stable HTN - controlled     Plan:    1.  Change back to Trulicity to 1.5AV weekly - gave #2 samples. Will try to get formulary exception for       Continue metformin XR 581m 2 tablets qd.      Continue Livalo 228m- take 1 every other day      Continue Zetia 1040mo 1/2 tablet qd  2.  Continue with low CHO diet / low calorie diet. 3.  Again reminded to exercise  - suggested swimming or stationary bike.  4.  Continue to check BG up to 2 times daily.   5.  Reminded that eye exam is due. 6.  RTC in 4 weeks.     TamCherre RobinsharmD   06/17/2016

## 2016-06-18 NOTE — Addendum Note (Signed)
Addended by: Cherre Robins on: 06/18/2016 08:32 AM   Modules accepted: Orders

## 2016-06-23 ENCOUNTER — Other Ambulatory Visit (INDEPENDENT_AMBULATORY_CARE_PROVIDER_SITE_OTHER): Payer: Medicare Other

## 2016-06-23 DIAGNOSIS — E871 Hypo-osmolality and hyponatremia: Secondary | ICD-10-CM

## 2016-06-24 LAB — BMP8+EGFR
BUN / CREAT RATIO: 20 (ref 12–28)
BUN: 16 mg/dL (ref 8–27)
CO2: 24 mmol/L (ref 18–29)
CREATININE: 0.82 mg/dL (ref 0.57–1.00)
Calcium: 9.3 mg/dL (ref 8.7–10.3)
Chloride: 93 mmol/L — ABNORMAL LOW (ref 96–106)
GFR calc Af Amer: 84 mL/min/{1.73_m2} (ref >59)
GFR, EST NON AFRICAN AMERICAN: 73 mL/min/{1.73_m2} (ref >59)
Glucose: 184 mg/dL — ABNORMAL HIGH (ref 65–99)
Potassium: 5.2 mmol/L (ref 3.5–5.2)
SODIUM: 133 mmol/L — AB (ref 134–144)

## 2016-07-02 ENCOUNTER — Other Ambulatory Visit: Payer: Self-pay | Admitting: *Deleted

## 2016-07-02 MED ORDER — GLIMEPIRIDE 4 MG PO TABS: 6.0000 mg | ORAL_TABLET | Freq: Every day | ORAL | 11 refills | Status: DC

## 2016-07-10 ENCOUNTER — Encounter: Payer: Self-pay | Admitting: Family

## 2016-07-10 ENCOUNTER — Ambulatory Visit (HOSPITAL_COMMUNITY)
Admission: RE | Admit: 2016-07-10 | Discharge: 2016-07-10 | Disposition: A | Discharge status: Home / Self Care | Payer: Medicare Other | Source: Ambulatory Visit | Attending: Family | Admitting: Family

## 2016-07-10 ENCOUNTER — Telehealth: Payer: Self-pay | Admitting: *Deleted

## 2016-07-10 ENCOUNTER — Ambulatory Visit (INDEPENDENT_AMBULATORY_CARE_PROVIDER_SITE_OTHER): Payer: Medicare Other | Admitting: Family

## 2016-07-10 ENCOUNTER — Other Ambulatory Visit: Payer: Self-pay | Admitting: Family Medicine

## 2016-07-10 ENCOUNTER — Other Ambulatory Visit: Payer: Self-pay | Admitting: Family

## 2016-07-10 VITALS — BP 124/74 | HR 87 | Temp 96.8°F | Ht 66.0 in | Wt 217.0 lb

## 2016-07-10 DIAGNOSIS — M79604 Pain in right leg: Secondary | ICD-10-CM

## 2016-07-10 MED ORDER — CYCLOBENZAPRINE HCL 10 MG PO TABS: 10.0000 mg | ORAL_TABLET | Freq: Three times a day (TID) | ORAL | 0 refills | Status: DC | PRN

## 2016-07-10 NOTE — Telephone Encounter (Signed)
Patient aware that [place] was negative for DVT.

## 2016-07-10 NOTE — Patient Instructions (Signed)
Deep Vein Thrombosis A deep vein thrombosis (DVT) is a blood clot (thrombus) that usually occurs in a deep, larger vein of the lower leg or the pelvis, or in an upper extremity such as the arm. These are dangerous and can lead to serious and even life-threatening complications if the clot travels to the lungs. A DVT can damage the valves in your leg veins so that instead of flowing upward, the blood pools in the lower leg. This is called post-thrombotic syndrome, and it can result in pain, swelling, discoloration, and sores on the leg. What are the causes? A DVT is caused by the formation of a blood clot in your leg, pelvis, or arm. Usually, several things contribute to the formation of blood clots. A clot may develop when:  Your blood flow slows down.  Your vein becomes damaged in some way.  You have a condition that makes your blood clot more easily.  What increases the risk? A DVT is more likely to develop in:  People who are older, especially over 60 years of age.  People who are overweight (obese).  People who sit or lie still for a long time, such as during long-distance travel (over 4 hours), bed rest, hospitalization, or during recovery from certain medical conditions like a stroke.  People who do not engage in much physical activity (sedentary lifestyle).  People who have chronic breathing disorders.  People who have a personal or family history of blood clots or blood clotting disease.  People who have peripheral vascular disease (PVD), diabetes, or some types of cancer.  People who have heart disease, especially if the person had a recent heart attack or has congestive heart failure.  People who have neurological diseases that affect the legs (leg paresis).  People who have had a traumatic injury, such as breaking a hip or leg.  People who have recently had major or lengthy surgery, especially on the hip, knee, or abdomen.  People who have had a central line placed  inside a large vein.  People who take medicines that contain the hormone estrogen. These include birth control pills and hormone replacement therapy.  Pregnancy or during childbirth or the postpartum period.  Long plane flights (over 8 hours).  What are the signs or symptoms?  Symptoms of a DVT can include:  Swelling of your leg or arm, especially if one side is much worse.  Warmth and redness of your leg or arm, especially if one side is much worse.  Pain in your arm or leg. If the clot is in your leg, symptoms may be more noticeable or worse when you stand or walk.  A feeling of pins and needles, if the clot is in the arm.  The symptoms of a DVT that has traveled to the lungs (pulmonary embolism, PE) usually start suddenly and include:  Shortness of breath while active or at rest.  Coughing or coughing up blood or blood-tinged mucus.  Chest pain that is often worse with deep breaths.  Rapid or irregular heartbeat.  Feeling light-headed or dizzy.  Fainting.  Feeling anxious.  Sweating.  There may also be pain and swelling in a leg if that is where the blood clot started. These symptoms may represent a serious problem that is an emergency. Do not wait to see if the symptoms will go away. Get medical help right away. Call your local emergency services (911 in the [place] Do not drive yourself to the hospital. How is this diagnosed? Your health   care provider will take a medical history and perform a physical exam. You may also have other tests, including:  Blood tests to assess the clotting properties of your blood.  Imaging tests, such as CT, ultrasound, MRI, X-ray, and other tests to see if you have clots anywhere in your body.  How is this treated? After a DVT is identified, it can be treated. The type of treatment that you receive depends on many factors, such as the cause of your DVT, your risk for bleeding or developing more clots, and other medical conditions that  you have. Sometimes, a combination of treatments is necessary. Treatment options may be combined and include:  Monitoring the blood clot with ultrasound.  Taking medicines by mouth, such as newer blood thinners (anticoagulants), thrombolytics, or warfarin.  Taking anticoagulant medicine by injection or through an IV tube.  Wearing compression stockings or using different types ofdevices.  Surgery (rare) to remove the blood clot or to place a filter in your abdomen to stop the blood clot from traveling to your lungs.  Treatments for a DVT are often divided into immediate treatment and long-term treatment (up to 3 months after DVT). You can work with your health care provider to choose the treatment program that is best for you. Follow these instructions at home: If you are taking a newer oral anticoagulant:  Take the medicine every single day at the same time each day.  Understand what foods and drugs interact with this medicine.  Understand that there are no regular blood tests required when using this medicine.  Understand the side effects of this medicine, including excessive bruising or bleeding. Ask your health care provider or pharmacist about other possible side effects. If you are taking warfarin:  Understand how to take warfarin and know which foods can affect how warfarin works in your body.  Understand that it is dangerous to take too much or too little warfarin. Too much warfarin increases the risk of bleeding. Too little warfarin continues to allow the risk for blood clots.  Follow your PT and INR blood testing schedule. The PT and INR results allow your health care provider to adjust your dose of warfarin. It is very important that you have your PT and INR tested as often as told by your health care provider.  Avoid major changes in your diet, or tell your health care provider before you change your diet. Arrange a visit with a registered dietitian to answer your  questions. Many foods, especially foods that are high in vitamin K, can interfere with warfarin and affect the PT and INR results. Eat a consistent amount of foods that are high in vitamin K, such as: ? Spinach, kale, broccoli, cabbage, collard greens, turnip greens, Brussels sprouts, peas, cauliflower, seaweed, and parsley. ? Beef liver and pork liver. ? Green tea. ? Soybean oil.  Tell your health care provider about any and all medicines, vitamins, and supplements that you take, including aspirin and other over-the-counter anti-inflammatory medicines. Be especially cautious with aspirin and anti-inflammatory medicines. Do not take those before you ask your health care provider if it is safe to do so. This is important because many medicines can interfere with warfarin and affect the PT and INR results.  Do not start or stop taking any over-the-counter or prescription medicine unless your health care provider or pharmacist tells you to do so. If you take warfarin, you will also need to do these things:  Hold pressure over cuts for longer than   usual.  Tell your dentist and other health care providers that you are taking warfarin before you have any procedures in which bleeding may occur.  Avoid alcohol or drink very small amounts. Tell your health care provider if you change your alcohol intake.  Do not use tobacco products, including cigarettes, chewing tobacco, and e-cigarettes. If you need help quitting, ask your health care provider.  Avoid contact sports.  General instructions  Take over-the-counter and prescription medicines only as told by your health care provider. Anticoagulant medicines can have side effects, including easy bruising and difficulty stopping bleeding. If you are prescribed an anticoagulant, you will also need to do these things: ? Hold pressure over cuts for longer than usual. ? Tell your dentist and other health care providers that you are taking anticoagulants  before you have any procedures in which bleeding may occur. ? Avoid contact sports.  Wear a medical alert bracelet or carry a medical alert card that says you have had a PE.  Ask your health care provider how soon you can go back to your normal activities. Stay active to prevent new blood clots from forming.  Make sure to exercise while traveling or when you have been sitting or standing for a long period of time. It is very important to exercise. Exercise your legs by walking or by tightening and relaxing your leg muscles often. Take frequent walks.  Wear compression stockings as told by your health care provider to help prevent more blood clots from forming.  Do not use tobacco products, including cigarettes, chewing tobacco, and e-cigarettes. If you need help quitting, ask your health care provider.  Keep all follow-up appointments with your health care provider. This is important. How is this prevented? Take these actions to decrease your risk of developing another DVT:  Exercise regularly. For at least 30 minutes every day, engage in: ? Activity that involves moving your arms and legs. ? Activity that encourages good blood flow through your body by increasing your heart rate.  Exercise your arms and legs every hour during long-distance travel (over 4 hours). Drink plenty of water and avoid drinking alcohol while traveling.  Avoid sitting or lying in bed for long periods of time without moving your legs.  Maintain a weight that is appropriate for your height. Ask your health care provider what weight is healthy for you.  If you are a woman who is over 35 years of age, avoid unnecessary use of medicines that contain estrogen. These include birth control pills.  Do not smoke, especially if you take estrogen medicines. If you need help quitting, ask your health care provider.  If you are hospitalized, prevention measures may include:  Early walking after surgery, as soon as your  health care provider says that it is safe.  Receiving anticoagulants to prevent blood clots.If you cannot take anticoagulants, other options may be available, such as wearing compression stockings or using different types of devices.  Get help right away if:  You have new or increased pain, swelling, or redness in an arm or leg.  You have numbness or tingling in an arm or leg.  You have shortness of breath while active or at rest.  You have chest pain.  You have a rapid or irregular heartbeat.  You feel light-headed or dizzy.  You cough up blood.  You notice blood in your vomit, bowel movement, or urine. These symptoms may represent a serious problem that is an emergency. Do not wait to see   if the symptoms will go away. Get medical help right away. Call your local emergency services (911 in the [place] Do not drive yourself to the hospital. This information is not intended to replace advice given to you by your health care provider. Make sure you discuss any questions you have with your health care provider. Document Released: 03/23/2005 Document Revised: 08/29/2015 Document Reviewed: 07/18/2014 Elsevier Interactive Patient Education  2017 [employer]  

## 2016-07-10 NOTE — Progress Notes (Signed)
   Subjective:    [name], female    DOB: 12-Oct-1945, 71 y.o.   MRN: [id number]  HPI Pt presents to the office today with right leg pain that started this morning. Pt states she is scheduled to leave for [place] tomorrow for 10 days and is worried because she can not walk on her leg. Pt states she is having constant pain of 9 out 10. Pt states she took a Norco with mild relief, but the pain did not go away.   Denies any injury or trauma, SOB, palpitations, or edema at this time.     Review of Systems  All other systems reviewed and are negative.      Objective:   Physical Exam  Constitutional: She is oriented to person, place, and time. She appears well-developed and well-nourished. No distress.  HENT:  Head: Normocephalic.  Eyes: Pupils are equal, round, and reactive to light.  Neck: Normal range of motion. Neck supple. No thyromegaly present.  Cardiovascular: Normal rate, regular rhythm, normal heart sounds and intact distal pulses.   No murmur heard. Pulmonary/Chest: Effort normal and breath sounds normal. No respiratory distress. She has no wheezes.  Abdominal: Soft. Bowel sounds are normal. She exhibits no distension. There is no tenderness.  Musculoskeletal: Normal range of motion. She exhibits tenderness (in bilateral calf and increased pain with walking). She exhibits no edema.  Negative homan's sign  Neurological: She is alert and oriented to person, place, and time. No cranial nerve deficit.  Skin: Skin is warm and dry.  Psychiatric: She has a normal mood and affect. Her behavior is normal. Judgment and thought content normal.  Vitals reviewed.     BP 124/74   Pulse 87   Temp (!) 96.8 F (36 C) (Oral)   Ht 5\' 6"  (1.676 m)   Wt 217 lb (98.4 kg)   BMI 35.02 kg/m      Assessment & Plan:  1. Right leg pain Xarelto starter pack given to pt If any SOB or chest pain go to ED Doppler pending Flexeril given to take if doppler is negative Follow up  with PCP in 1-2 weeks  - Ultrasound doppler venous legs bilat; Future - cyclobenzaprine (FLEXERIL) 10 MG tablet; Take 1 tablet (10 mg total) by mouth 3 (three) times daily as needed for muscle spasms.  Dispense: 60 tablet; Refill: 0  Evelina Dun, FNP

## 2016-07-23 ENCOUNTER — Ambulatory Visit (INDEPENDENT_AMBULATORY_CARE_PROVIDER_SITE_OTHER): Payer: Medicare Other | Admitting: Pharmacist

## 2016-07-23 VITALS — BP 122/72 | Ht 66.0 in | Wt 214.0 lb

## 2016-07-23 DIAGNOSIS — E119 Type 2 diabetes mellitus without complications: Secondary | ICD-10-CM | POA: Diagnosis not present

## 2016-07-23 NOTE — Progress Notes (Signed)
[name], female   DOB: [birth year], 71 y.o.   MRN: [id number]     Subjective:   Haas Mary is a 71 y.o. married, white female who presents for follow up uncontrolled DM.    Mrs. [name] is currently taking the following medications for diabetes:  Metformin XR 540m 2 tablets daily, glimepiride 488mqam and Truliclity 1.4.0JWQ weekly.   She was previously taking Bydureon instead of Trulicity because of insurance formulary preferrance however her A1c increased from 8.3% 03/18/2016 to 10.0% 06/17/2016.  On March 14th, we gave her samples for Trulicity 1.1.1BJnd she had been using Trulicity 1.4.7WGq weekly since.   Mrs. [name] that her BG has improved over the last month since she switched back to Trulicity to [employer]   Patient is checking BG 2 times daily HBG readings (starting 06/17/16 through today): 269, 268, 202, 220, 181, 189, 176, 170, 147, 111, 186, 176, 178, 123, 153, 145, 143, 128, 118, 142, 134, 145, 141, 111, 111, 132, 115, 130, 133, 135, 120, 109, 140, 128.  BG has improved significantly over the last 4 weeks.   Diet - patient continues to follow CHO counting diets.   Weight has decreased 4# over the last 3 months. Exercise - none due to foot pain which is improving.  Current Medications (verified) Outpatient Encounter Prescriptions as of 07/23/2016  Medication Sig  . ALPRAZolam (XANAX) 0.5 MG tablet Take 1 tablet (0.5 mg total) by mouth at bedtime as needed.  . ARIPiprazole (ABILIFY) 2 MG tablet Take 1 mg by mouth daily.   . Marland Kitchenspirin EC 81 MG tablet Take 1 tablet (81 mg total) by mouth daily. (Patient not taking: Reported on 04/30/2016)  . blood glucose meter kit and supplies KIT Dispense based on patient and insurance preference. Use up to two times daily as directed. (Dx: type 2 DM - E11.9)  . Blood Glucose Monitoring Suppl DEVI Blood glucose meter One Touch Brand. Use to check BS up to BID. DX 250.02  . cholecalciferol (VITAMIN D) 1000 UNITS tablet Take  2,000 Units by mouth daily.   . Marland Kitchenonjugated estrogens (PREMARIN) vaginal cream Place 1 Applicatorful vaginally at bedtime as needed. Use q hs  . cyclobenzaprine (FLEXERIL) 10 MG tablet Take 1 tablet (10 mg total) by mouth 3 (three) times daily as needed for muscle spasms.  . Dulaglutide (TRULICITY) 1.5 MGNF/6.2ZHOPN Inject 1.5 mg into the skin once a week.  . escitalopram (LEXAPRO) 20 MG tablet Take 1 tablet (20 mg total) by mouth daily.  . Marland Kitchensomeprazole (NEXIUM) 40 MG capsule TAKE 1 CAPSULE (40 MG TOTAL) BY MOUTH BID.  . Marland Kitchenzetimibe (ZETIA) 10 MG tablet TAKE 1 TABLET ONCE A DAY  . gabapentin (NEURONTIN) 600 MG tablet Take 1 tablet (600 mg total) by mouth 4 (four) times daily.  . Marland Kitchenlimepiride (AMARYL) 4 MG tablet Take 1.5 tablets (6 mg total) by mouth daily before breakfast.  . Glucose Blood (BLOOD GLUCOSE TEST STRIPS) STRP Use to check BG qid.  DX:  E11.9  . glucose monitoring kit (FREESTYLE) monitoring kit Please dispense whichever glucometer patient's insurance will cover.  Use to check BG up to twice.  DX: 250.02  . HYDROcodone-acetaminophen (NORCO) 7.5-325 MG per tablet Take 1 tablet by mouth 2 (two) times daily as needed.   . Lancets 30G MISC Use to check BG twice daily.  Dx:  250.02  . lisinopril-hydrochlorothiazide (PRINZIDE,ZESTORETIC) 20-25 MG tablet Take 1 tablet by mouth daily.  . metFORMIN (  GLUCOPHAGE-XR) 500 MG 24 hr tablet Take 2 tablets (1,000 mg total) by mouth daily with breakfast. TAKE (1) TABLET DAILY WITH BREAKFAST.  Marland Kitchen Omega-3 Fatty Acids (FISH OIL) 1000 MG CAPS Take by mouth 2 (two) times daily.    [name] DELICA LANCETS 92Z MISC CHECK BLOOD SUGAR UP TO TWICE DAILY AS DIRECTED  . Pitavastatin Calcium 2 MG TABS Take 1 tablet (2 mg total) by mouth every other day.   No facility-administered encounter medications on file as of 07/23/2016.     Allergies (verified) Codeine; Crestor [rosuvastatin calcium]; Erythromycin; Fenofibrate; Lyrica [pregabalin]; Penicillins; Statins;  Sulfa antibiotics; and Zocor [simvastatin]   History: Past Medical History:  Diagnosis Date  . Arthritis    [place]   . Cancer ([place])   . Colon polyps   . Depression   . Diabetes mellitus, type 2 ([place]) 06/2010  . Diverticulosis   . Esophageal stricture   . Fibromyalgia    [place]   . GERD (gastroesophageal reflux disease)   . Hemorrhoids   . Hiatal hernia   . Hyperlipidemia   . Hypertension   . Menopause   . Peripheral vascular insufficiency ([place])   . Vitamin D deficiency    Past Surgical History:  Procedure Laterality Date  . ABDOMINAL HYSTERECTOMY     partial and then total  . ANKLE SURGERY     right  . ELBOW SURGERY     left  . JOINT REPLACEMENT Bilateral    knee  . REFRACTIVE SURGERY  2003  . REPLACEMENT TOTAL KNEE Bilateral   . TOTAL KNEE ARTHROPLASTY     bilateral  . VESICOVAGINAL FISTULA CLOSURE W/ TAH  1981   Family History  Problem Relation Age of Onset  . Diabetes Mother   . Heart failure Mother   . Hypertension Mother   . Cirrhosis Father   . Cirrhosis Brother     Objective:    Today's Vitals   03/11/15 0853  BP: 130/70  Pulse: 77  Height: 5' 6"  (1.676 m)  Weight: 217 lb (98.431 kg)   Body mass index is 34.54 kg/m.   A1c was 10.1% (06/17/2016)   Assessment:    Type 2 DM - uncontrolled Obesity - weight is decreasing  HTN - controlled     Plan:    1.  Continue Trulicity1.24m SQ weekly - gave #2 samples. Will try to get formulary exception for       Continue metformin XR 5024m2 tablets qd.      Continue Livalo 68m58m take 1 every other day      Continue Zetia 72m31m 1/2 tablet qd  2.  Continue with low CHO diet / low calorie diet. 3.  Try to add exercise as able - start with just 10 minutes daily and increase to 30 minutes or more daily as able 4.  Continue to check BG up to 2 times daily.   5.  RTC in 2 weeks to see PCP    TammCherre RobinsarmD   07/23/2016    [name]   DOB: 11/07/03-Mar-1947 y69.    MRN: [id number]

## 2016-08-06 ENCOUNTER — Telehealth: Payer: Self-pay | Admitting: Family Medicine

## 2016-08-06 NOTE — Telephone Encounter (Signed)
Left #1 box of Trulicity 1.5mg  for patient.  In process of getting PA for Trulicity.

## 2016-08-06 NOTE — Telephone Encounter (Signed)
appt changed

## 2016-08-07 ENCOUNTER — Other Ambulatory Visit: Payer: Self-pay | Admitting: Family Medicine

## 2016-08-10 ENCOUNTER — Ambulatory Visit: Payer: Medicare Other | Admitting: Family Medicine

## 2016-08-13 ENCOUNTER — Ambulatory Visit: Payer: Medicare Other | Admitting: Family Medicine

## 2016-08-13 DIAGNOSIS — E119 Type 2 diabetes mellitus without complications: Secondary | ICD-10-CM | POA: Diagnosis not present

## 2016-08-13 DIAGNOSIS — H5203 Hypermetropia, bilateral: Secondary | ICD-10-CM | POA: Diagnosis not present

## 2016-08-13 DIAGNOSIS — Z7984 Long term (current) use of oral hypoglycemic drugs: Secondary | ICD-10-CM | POA: Diagnosis not present

## 2016-08-13 DIAGNOSIS — H52223 Regular astigmatism, bilateral: Secondary | ICD-10-CM | POA: Diagnosis not present

## 2016-08-13 LAB — HM DIABETES EYE EXAM

## 2016-08-25 ENCOUNTER — Telehealth: Payer: Self-pay | Admitting: Family Medicine

## 2016-08-25 NOTE — Telephone Encounter (Signed)
I am out of office until 08/26/16 in am - will double check for samples when I return.

## 2016-08-25 NOTE — Telephone Encounter (Signed)
Left message for pt no samples available at this time

## 2016-08-26 DIAGNOSIS — E119 Type 2 diabetes mellitus without complications: Secondary | ICD-10-CM | POA: Diagnosis not present

## 2016-08-26 NOTE — Telephone Encounter (Signed)
There were samples of Trulicty #1 box left for patient.  We have submitted PA for patient for Trulicity and are awaiting insurance decision.

## 2016-09-01 ENCOUNTER — Other Ambulatory Visit: Payer: Self-pay | Admitting: *Deleted

## 2016-09-01 ENCOUNTER — Telehealth: Payer: Self-pay | Admitting: Family Medicine

## 2016-09-01 DIAGNOSIS — K219 Gastro-esophageal reflux disease without esophagitis: Secondary | ICD-10-CM

## 2016-09-01 DIAGNOSIS — E119 Type 2 diabetes mellitus without complications: Secondary | ICD-10-CM

## 2016-09-01 DIAGNOSIS — E785 Hyperlipidemia, unspecified: Secondary | ICD-10-CM

## 2016-09-01 DIAGNOSIS — M797 Fibromyalgia: Secondary | ICD-10-CM

## 2016-09-01 DIAGNOSIS — E559 Vitamin D deficiency, unspecified: Secondary | ICD-10-CM

## 2016-09-01 DIAGNOSIS — I10 Essential (primary) hypertension: Secondary | ICD-10-CM

## 2016-09-01 DIAGNOSIS — E1169 Type 2 diabetes mellitus with other specified complication: Secondary | ICD-10-CM

## 2016-09-01 NOTE — Telephone Encounter (Signed)
Noted  

## 2016-09-03 ENCOUNTER — Ambulatory Visit (INDEPENDENT_AMBULATORY_CARE_PROVIDER_SITE_OTHER): Payer: Medicare Other | Admitting: Family Medicine

## 2016-09-03 ENCOUNTER — Encounter: Payer: Self-pay | Admitting: Family Medicine

## 2016-09-03 VITALS — BP 134/75 | HR 89 | Temp 98.0°F | Ht 66.0 in | Wt 215.0 lb

## 2016-09-03 DIAGNOSIS — E559 Vitamin D deficiency, unspecified: Secondary | ICD-10-CM | POA: Diagnosis not present

## 2016-09-03 DIAGNOSIS — E785 Hyperlipidemia, unspecified: Secondary | ICD-10-CM

## 2016-09-03 DIAGNOSIS — E119 Type 2 diabetes mellitus without complications: Secondary | ICD-10-CM

## 2016-09-03 DIAGNOSIS — E1169 Type 2 diabetes mellitus with other specified complication: Secondary | ICD-10-CM

## 2016-09-03 DIAGNOSIS — K219 Gastro-esophageal reflux disease without esophagitis: Secondary | ICD-10-CM | POA: Diagnosis not present

## 2016-09-03 DIAGNOSIS — N390 Urinary tract infection, site not specified: Secondary | ICD-10-CM

## 2016-09-03 DIAGNOSIS — I10 Essential (primary) hypertension: Secondary | ICD-10-CM | POA: Diagnosis not present

## 2016-09-03 DIAGNOSIS — Z6834 Body mass index (BMI) 34.0-34.9, adult: Secondary | ICD-10-CM

## 2016-09-03 DIAGNOSIS — M797 Fibromyalgia: Secondary | ICD-10-CM

## 2016-09-03 DIAGNOSIS — Z23 Encounter for immunization: Secondary | ICD-10-CM

## 2016-09-03 LAB — BAYER DCA HB A1C WAIVED: HB A1C (BAYER DCA - WAIVED): 7.6 % — ABNORMAL HIGH (ref <7.0)

## 2016-09-03 MED ORDER — SULFAMETHOXAZOLE-TRIMETHOPRIM 800-160 MG PO TABS: 1.0000 | ORAL_TABLET | Freq: Two times a day (BID) | ORAL | 2 refills | Status: DC

## 2016-09-03 NOTE — Patient Instructions (Addendum)
Medicare Annual Wellness Visit  [facility] and the medical providers at [facility] strive to bring you the best medical care.  In doing so we not only want to address your current medical conditions and concerns but also to detect new conditions early and prevent illness, disease and health-related problems.    Medicare offers a yearly Wellness Visit which allows our clinical staff to assess your need for preventative services including immunizations, lifestyle education, counseling to decrease risk of preventable diseases and screening for fall risk and other medical concerns.    This visit is provided free of charge (no copay) for all Medicare recipients. The clinical pharmacists at [facility] have begun to conduct these Wellness Visits which will also include a thorough review of all your medications.    As you primary medical provider recommend that you make an appointment for your Annual Wellness Visit if you have not done so already this year.  You may set up this appointment before you leave today or you may call back ([phone]) and schedule an appointment.  Please make sure when you call that you mention that you are scheduling your Annual Wellness Visit with the clinical pharmacist so that the appointment may be made for the proper length of time.     Continue current medications. Continue good therapeutic lifestyle changes which include good diet and exercise. Fall precautions discussed with patient. If an FOBT was given today- please return it to our front desk. If you are over 68 years old - you may need Prevnar 58 or the adult Pneumonia vaccine.  **Flu shots are available--- please call and schedule a FLU-CLINIC appointment**  After your visit with [place] today you will receive a survey in the mail or online from [employer] regarding your care with [place]. Please take a moment to fill this out. Your feedback is very  important to [place] as you can help [place] better understand your patient needs as well as improve your experience and satisfaction. WE CARE ABOUT YOU!!!  Continue to stay active. Always drink plenty of fluids Follow-up with orthopedics as planned

## 2016-09-03 NOTE — Progress Notes (Signed)
Subjective:    [name], female    DOB: Dec 28, 1945, 71 y.o.   MRN: [id number]  HPI Pt here for follow up and management of chronic medical problems which includes diabetes, hyperlipidemia and hypertension. He is taking medication regularly.The patient is doing well overall with no specific complaints today. She is diabetic and has a history of depression hypertension fibromyalgia and chronic back pain. She is due to get lab work today and will be given an FOBT to return. She keeps Bactrim on hand for urinary tract infections. Her BMI today is 34.6. The patient is doing well overall as mentioned. She is followed regularly by her optometrist Dr. Sabra Heck in her orthopedist at [place]. She does take Septra DS periodically for urinary tract infections. She denies any chest pain or shortness of breath. She does have ongoing back pain and knee pain even though both knees of been replaced. She has periodic urinary tract infections and unfortunately the symptoms occur when she is taking trips. The patient recently had a trip to [place] and did wonderfully on this trip except she did develop a urinary tract infection. She is limited somewhat because of the back pain and knee pain that she has but it is being followed regularly by the orthopedic doctors in [place]. She denies any problems with her hiatal hernia. She denies any chest pain shortness of breath nausea vomiting diarrhea or blood in the stool. She is up-to-date on her colonoscopies. She does have these frequent urinary tract infections. Currently she is doing well with this. She was encouraged to drink plenty of fluids when traveling.     Patient Active Problem List   Diagnosis Date Noted  . 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 Depression 02/28/2013  . GERD (gastroesophageal reflux disease) 02/28/2013  . Hiatal hernia with gastroesophageal reflux 02/28/2013  . Peripheral neuropathy 02/28/2013  . DDD  (degenerative disc disease), lumbosacral 06/23/2012  . Fibromyalgia syndrome 06/23/2012  . Essential hypertension, benign 06/23/2012  . Osteopenia 06/23/2012  . Diabetes ([place]) 06/23/2012  . Abnormal EKG 10/29/2010  . Obesity due to excess calories 10/29/2010  . 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 DYSPHAGIA 10/11/2009  . PERSONAL HISTORY OF COLONIC POLYPS 10/11/2009   Outpatient Encounter Prescriptions as of 09/03/2016  Medication Sig  . ALPRAZolam (XANAX) 0.5 MG tablet Take 1 tablet (0.5 mg total) by mouth at bedtime as needed.  . ARIPiprazole (ABILIFY) 2 MG tablet Take 1 mg by mouth daily.   . blood glucose meter kit and supplies KIT Dispense based on patient and insurance preference. Use up to two times daily as directed. (Dx: type 2 DM - E11.9)  . Blood Glucose Monitoring Suppl DEVI Blood glucose meter One Touch Brand. Use to check BS up to BID. DX 250.02  . cholecalciferol (VITAMIN D) 1000 UNITS tablet Take 2,000 Units by mouth daily.   Marland Kitchen conjugated estrogens (PREMARIN) vaginal cream Place 1 Applicatorful vaginally at bedtime as needed. Use q hs  . cyclobenzaprine (FLEXERIL) 10 MG tablet Take 1 tablet (10 mg total) by mouth 3 (three) times daily as needed for muscle spasms.  . Dulaglutide (TRULICITY) 1.5 AT/5.5DD SOPN Inject 1.5 mg into the skin once a week.  . escitalopram (LEXAPRO) 20 MG tablet Take 1 tablet (20 mg total) by mouth daily.  Marland Kitchen esomeprazole (NEXIUM) 40 MG capsule TAKE 1 CAPSULE (40 MG TOTAL) BY MOUTH BID.  Marland Kitchen ezetimibe (ZETIA) 10 MG tablet TAKE 1 TABLET ONCE A DAY  . gabapentin (  NEURONTIN) 600 MG tablet Take 1 tablet (600 mg total) by mouth 4 (four) times daily.  Marland Kitchen glimepiride (AMARYL) 4 MG tablet Take 1.5 tablets (6 mg total) by mouth daily before breakfast.  . Glucose Blood (BLOOD GLUCOSE TEST STRIPS) STRP Use to check BG qid.  DX:  E11.9  . glucose monitoring kit (FREESTYLE) monitoring kit Please dispense whichever glucometer patient's insurance will cover.   Use to check BG up to twice.  DX: 250.02  . HYDROcodone-acetaminophen (NORCO) 7.5-325 MG per tablet Take 1 tablet by mouth 2 (two) times daily as needed.   . Lancets 30G MISC Use to check BG twice daily.  Dx:  250.02  . lisinopril-hydrochlorothiazide (PRINZIDE,ZESTORETIC) 20-25 MG tablet Take 1 tablet by mouth daily.  Marland Kitchen LIVALO 2 MG TABS TAKE  (1)  TABLET  EVERY OTHER DAY.  . metFORMIN (GLUCOPHAGE-XR) 500 MG 24 hr tablet Take 2 tablets (1,000 mg total) by mouth daily with breakfast. TAKE (1) TABLET DAILY WITH BREAKFAST.  Marland Kitchen Omega-3 Fatty Acids (FISH OIL) 1000 MG CAPS Take by mouth 2 (two) times daily.    [name] DELICA LANCETS 47B MISC CHECK BLOOD SUGAR UP TO TWICE DAILY AS DIRECTED  . [DISCONTINUED] aspirin EC 81 MG tablet Take 1 tablet (81 mg total) by mouth daily.   No facility-administered encounter medications on file as of 09/03/2016.      Review of Systems  Constitutional: Negative.   HENT: Negative.   Eyes: Negative.   Respiratory: Negative.   Cardiovascular: Negative.   Gastrointestinal: Negative.   Endocrine: Negative.   Genitourinary: Negative.   Musculoskeletal: Negative.   Skin: Negative.   Allergic/Immunologic: Negative.   Neurological: Negative.   Hematological: Negative.   Psychiatric/Behavioral: Negative.        Objective:   Physical Exam  Constitutional: She is oriented to person, place, and time. She appears well-developed and well-nourished. No distress.  Pleasant and alert  HENT:  Head: Normocephalic and atraumatic.  Right Ear: External ear normal.  Left Ear: External ear normal.  Nose: Nose normal.  Mouth/Throat: Oropharynx is clear and moist. No oropharyngeal exudate.  Eyes: Conjunctivae and EOM are normal. Pupils are equal, round, and reactive to light. Right eye exhibits no discharge. Left eye exhibits no discharge. No scleral icterus.  Neck: Normal range of motion. Neck supple. No thyromegaly present.  No bruits thyromegaly or anterior cervical  adenopathy  Cardiovascular: Normal rate, regular rhythm, normal heart sounds and intact distal pulses.   No murmur heard. The heart is regular at 72/m  Pulmonary/Chest: Effort normal and breath sounds normal. No respiratory distress. She has no wheezes. She has no rales.  Abdominal: Soft. Bowel sounds are normal. She exhibits no mass. There is no tenderness. There is no rebound and no guarding.  Abdominal obesity without masses tenderness or organ enlargement bruits or suprapubic tenderness.  Musculoskeletal: Normal range of motion. She exhibits no edema.  The patient's mobility today was good.  Lymphadenopathy:    She has no cervical adenopathy.  Neurological: She is alert and oriented to person, place, and time. She has normal reflexes. No cranial nerve deficit.  Skin: Skin is warm and dry. No rash noted.  Psychiatric: She has a normal mood and affect. Her behavior is normal. Judgment and thought content normal.  Nursing note and vitals reviewed.   BP 134/75 (BP Location: Left Arm)   Pulse 89   Temp 98 F (36.7 C) (Oral)   Ht _0  (1.676 m)   Wt 215 lb (  97.5 kg)   BMI 34.70 kg/m        Assessment & Plan:  1. Type 2 diabetes mellitus without complication, without long-term current use of insulin ([place]) -Continue current treatment for the time being pending results of lab work -Continue with exercise and diet - CBC with Differential/Platelet - Bayer DCA Hb A1c Waived  2. Essential hypertension, benign -The blood pressure good today and she will continue with current treatment - BMP8+EGFR - CBC with Differential/Platelet - Hepatic function panel  3. Hyperlipidemia associated with type 2 diabetes mellitus ([place]) -Continue with current treatment and aggressive therapeutic lifestyle changes pending results of lab work - CBC with Differential/Platelet - NMR, lipoprofile  4. Vitamin D deficiency -Continue with vitamin D replacement pending results of lab work - CBC with  Differential/Platelet - VITAMIN D 25 Hydroxy (Vit-D Deficiency, Fractures)  5. Fibromyalgia syndrome -The patient is doing fairly well with this and her increased activity recently seems to have helped her to feel better. - CBC with Differential/Platelet  6. Gastroesophageal reflux disease, esophagitis presence not specified -Continue with Nexium on a regular basis - CBC with Differential/Platelet - Hepatic function panel  7. BMI 34.0-34.9,adult -Continue to work aggressively with diet and exercise to help weight loss.  8. Recurrent urinary tract infection -Refill prescription for Septra DS to take as needed  Meds ordered this encounter  Medications  . sulfamethoxazole-trimethoprim (BACTRIM DS) 800-160 MG tablet    Sig: Take 1 tablet by mouth 2 (two) times daily.    Dispense:  14 tablet    Refill:  2   Patient Instructions                       Medicare Annual Wellness Visit  [facility] and the medical providers at [facility] strive to bring you the best medical care.  In doing so we not only want to address your current medical conditions and concerns but also to detect new conditions early and prevent illness, disease and health-related problems.    Medicare offers a yearly Wellness Visit which allows our clinical staff to assess your need for preventative services including immunizations, lifestyle education, counseling to decrease risk of preventable diseases and screening for fall risk and other medical concerns.    This visit is provided free of charge (no copay) for all Medicare recipients. The clinical pharmacists at [facility] have begun to conduct these Wellness Visits which will also include a thorough review of all your medications.    As you primary medical provider recommend that you make an appointment for your Annual Wellness Visit if you have not done so already this year.  You may set up this appointment before you  leave today or you may call back ([phone]) and schedule an appointment.  Please make sure when you call that you mention that you are scheduling your Annual Wellness Visit with the clinical pharmacist so that the appointment may be made for the proper length of time.     Continue current medications. Continue good therapeutic lifestyle changes which include good diet and exercise. Fall precautions discussed with patient. If an FOBT was given today- please return it to our front desk. If you are over 77 years old - you may need Prevnar 7 or the adult Pneumonia vaccine.  **Flu shots are available--- please call and schedule a FLU-CLINIC appointment**  After your visit with [place] today you will receive a survey in the mail or  online from [employer] regarding your care with [place]. Please take a moment to fill this out. Your feedback is very important to [place] as you can help [place] better understand your patient needs as well as improve your experience and satisfaction. WE CARE ABOUT YOU!!!  Continue to stay active. Always drink plenty of fluids Follow-up with orthopedics as planned     Arrie Senate MD

## 2016-09-04 LAB — BMP8+EGFR
BUN/Creatinine Ratio: 13 (ref 12–28)
BUN: 10 mg/dL (ref 8–27)
CO2: 25 mmol/L (ref 18–29)
Calcium: 9.8 mg/dL (ref 8.7–10.3)
Chloride: 93 mmol/L — ABNORMAL LOW (ref 96–106)
Creatinine, Ser: 0.79 mg/dL (ref 0.57–1.00)
GFR, EST AFRICAN AMERICAN: 88 mL/min/{1.73_m2} (ref >59)
GFR, EST NON AFRICAN AMERICAN: 76 mL/min/{1.73_m2} (ref >59)
Glucose: 147 mg/dL — ABNORMAL HIGH (ref 65–99)
Potassium: 5.4 mmol/L — ABNORMAL HIGH (ref 3.5–5.2)
Sodium: 135 mmol/L (ref 134–144)

## 2016-09-04 LAB — HEPATIC FUNCTION PANEL
ALBUMIN: 4.3 g/dL (ref 3.5–4.8)
ALT: 48 IU/L — ABNORMAL HIGH (ref 0–32)
AST: 52 IU/L — AB (ref 0–40)
Alkaline Phosphatase: 90 IU/L (ref 39–117)
BILIRUBIN, DIRECT: 0.09 mg/dL (ref 0.00–0.40)
Bilirubin Total: 0.2 mg/dL (ref 0.0–1.2)
Total Protein: 7.1 g/dL (ref 6.0–8.5)

## 2016-09-04 LAB — CBC WITH DIFFERENTIAL/PLATELET
BASOS: 1 %
Basophils Absolute: 0 10*3/uL (ref 0.0–0.2)
EOS (ABSOLUTE): 0.2 10*3/uL (ref 0.0–0.4)
Eos: 2 %
HEMOGLOBIN: 12.9 g/dL (ref 11.1–15.9)
Hematocrit: 40.6 % (ref 34.0–46.6)
IMMATURE GRANS (ABS): 0 10*3/uL (ref 0.0–0.1)
IMMATURE GRANULOCYTES: 0 %
Lymphocytes Absolute: 2.6 10*3/uL (ref 0.7–3.1)
Lymphs: 36 %
MCH: 27.7 pg (ref 26.6–33.0)
MCHC: 31.8 g/dL (ref 31.5–35.7)
MCV: 87 fL (ref 79–97)
MONOCYTES: 13 %
Monocytes Absolute: 0.9 10*3/uL (ref 0.1–0.9)
NEUTROS ABS: 3.5 10*3/uL (ref 1.4–7.0)
NEUTROS PCT: 48 %
Platelets: 241 10*3/uL (ref 150–379)
RBC: 4.65 x10E6/uL (ref 3.77–5.28)
RDW: 15.4 % (ref 12.3–15.4)
WBC: 7.2 10*3/uL (ref 3.4–10.8)

## 2016-09-04 LAB — NMR, LIPOPROFILE
CHOLESTEROL: 162 mg/dL (ref 100–199)
HDL CHOLESTEROL BY NMR: 48 mg/dL (ref >39)
HDL Particle Number: 33.1 umol/L (ref >=30.5)
LDL PARTICLE NUMBER: 1246 nmol/L — AB (ref <1000)
LDL SIZE: 20.3 nm (ref >20.5)
LDL-C: 82 mg/dL (ref 0–99)
LP-IR Score: 61 — ABNORMAL HIGH (ref <=45)
SMALL LDL PARTICLE NUMBER: 758 nmol/L — AB (ref <=527)
TRIGLYCERIDES BY NMR: 158 mg/dL — AB (ref 0–149)

## 2016-09-04 LAB — VITAMIN D 25 HYDROXY (VIT D DEFICIENCY, FRACTURES): Vit D, 25-Hydroxy: 41.9 ng/mL (ref 30.0–100.0)

## 2016-09-07 ENCOUNTER — Other Ambulatory Visit: Payer: Self-pay | Admitting: Pharmacist

## 2016-09-09 DIAGNOSIS — Z1231 Encounter for screening mammogram for malignant neoplasm of breast: Secondary | ICD-10-CM | POA: Diagnosis not present

## 2016-09-10 ENCOUNTER — Telehealth: Payer: Self-pay | Admitting: *Deleted

## 2016-09-10 NOTE — Telephone Encounter (Signed)
Will have t owait on dr.Kolodziej- has not been addressed in notes lately

## 2016-09-11 ENCOUNTER — Telehealth: Payer: Self-pay | Admitting: Pharmacist

## 2016-09-11 NOTE — Telephone Encounter (Signed)
Trulicity was approved for Non Formulary Exception for [name] from 08/21/2016 thru 08/21/2017.  Patient notified and will contact pharmacy for next refill.

## 2016-09-12 NOTE — Telephone Encounter (Signed)
Schedule appointment With [name]

## 2016-09-16 ENCOUNTER — Other Ambulatory Visit: Payer: Self-pay | Admitting: *Deleted

## 2016-09-16 MED ORDER — ALPRAZOLAM 0.5 MG PO TABS: 0.5000 mg | ORAL_TABLET | Freq: Every evening | ORAL | 1 refills | Status: DC | PRN

## 2016-09-17 ENCOUNTER — Other Ambulatory Visit: Payer: Medicare Other

## 2016-09-17 DIAGNOSIS — E875 Hyperkalemia: Secondary | ICD-10-CM

## 2016-09-18 LAB — BMP8+EGFR
BUN/Creatinine Ratio: 12 (ref 12–28)
BUN: 9 mg/dL (ref 8–27)
CALCIUM: 9.5 mg/dL (ref 8.7–10.3)
CO2: 23 mmol/L (ref 20–29)
CREATININE: 0.75 mg/dL (ref 0.57–1.00)
Chloride: 95 mmol/L — ABNORMAL LOW (ref 96–106)
GFR, EST AFRICAN AMERICAN: 93 mL/min/{1.73_m2} (ref >59)
GFR, EST NON AFRICAN AMERICAN: 81 mL/min/{1.73_m2} (ref >59)
Glucose: 188 mg/dL — ABNORMAL HIGH (ref 65–99)
Potassium: 4.8 mmol/L (ref 3.5–5.2)
SODIUM: 134 mmol/L (ref 134–144)

## 2016-09-25 ENCOUNTER — Other Ambulatory Visit: Payer: Self-pay | Admitting: Pharmacist

## 2016-09-25 MED ORDER — DULAGLUTIDE 1.5 MG/0.5ML ~~LOC~~ SOAJ: 1.5000 mg | SUBCUTANEOUS | 4 refills | Status: DC

## 2016-09-28 ENCOUNTER — Telehealth: Payer: Self-pay | Admitting: Family Medicine

## 2016-09-28 NOTE — Telephone Encounter (Signed)
Will have to research patient's insurance formulary. She is actually in the coverage gap and any brand name medication will be expensive.  In the meantime - gave #2 pens of Trulicity until can determine next best step.

## 2016-10-02 DIAGNOSIS — M170 Bilateral primary osteoarthritis of knee: Secondary | ICD-10-CM | POA: Diagnosis not present

## 2016-10-02 DIAGNOSIS — M1811 Unilateral primary osteoarthritis of first carpometacarpal joint, right hand: Secondary | ICD-10-CM | POA: Diagnosis not present

## 2016-10-02 DIAGNOSIS — M1812 Unilateral primary osteoarthritis of first carpometacarpal joint, left hand: Secondary | ICD-10-CM | POA: Diagnosis not present

## 2016-10-02 MED ORDER — SEMAGLUTIDE (1 MG/DOSE) 2 MG/1.5ML ~~LOC~~ SOPN: 1.0000 mg | PEN_INJECTOR | SUBCUTANEOUS | 0 refills | Status: DC

## 2016-10-02 NOTE — Telephone Encounter (Signed)
Reviewed formulary - Ozempic and Bydureon are tier 3. Patient has tried [place] and was not as effective as Trulicity.  A1c increased.   Rx called to [facility] for [place] is $37.  Will switch to Ozempic - start with 0.5mg  weekly(since already taking Trulicity) for 1 month then increase to 1mg  qweek thereafter.

## 2016-10-05 ENCOUNTER — Encounter: Payer: Self-pay | Admitting: Pharmacist

## 2016-10-05 ENCOUNTER — Ambulatory Visit (INDEPENDENT_AMBULATORY_CARE_PROVIDER_SITE_OTHER): Payer: Medicare Other | Admitting: Pharmacist

## 2016-10-05 VITALS — BP 120/70 | HR 87 | Ht 66.0 in | Wt 214.2 lb

## 2016-10-05 DIAGNOSIS — E1149 Type 2 diabetes mellitus with other diabetic neurological complication: Secondary | ICD-10-CM

## 2016-10-05 NOTE — Patient Instructions (Signed)
Start Ozempic - since you have been taking Trulicity I recommend starting at 0.5mg  of Ozempic - inject 0.5mg  once a week for 1 month.   Will plan to increase to 1mg  weekly thereafter if needed and if tolerated.

## 2016-10-05 NOTE — Progress Notes (Signed)
Patient ID: MONSERRATH JUNIO, female   DOB: 08/17/1945, 71 y.o.   MRN: [id number]   Subjective:    [name] is a 71 y.o. female who presents for education on administration of Ozempic.  [name] has been taking Trulicity with great success however the copay is veriy expensive.  Her insurance does cover Ozempic at a reasonable copay.   [name] reports that she has been seen recently in metabolic / weight loss clinic.  She was started on phentermine 37.5mg  1/2 tablet daily.  She is feeling very confident about changes she has made in her diet and reports that she feels good.  She states that she phentermine was started the physician at the metabolic clinic advised that she decrease glimepiride to 2mg  daily (from 6mg  daily).  She has not seen an increase in BG with this change over the last week.  Current diabetic medications include metformin XR 500mg  2 tablets qd (has been unable to tolerate higher doses); Trulicity 1.5mg  weekly; glimerpiride 2mg  qd.   Current monitoring regimen: home blood tests - 1-2 times daily Home blood sugar records: stable - ranges form 120 to 175 Any episodes of hypoglycemia? no  Known diabetic complications: peripheral neuropathy and peripheral vascular disease Cardiovascular risk factors: advanced age (older than 64 for men, 30 for women), diabetes mellitus, dyslipidemia, hypertension and obesity (BMI >= 30 kg/m2) Eye exam current (within one year): yes Weight trend: stable Current diet: in general, a "healthy" diet  , low CHO / low calorie diet Current exercise: none Medication Compliance?  Yes   Is She on ACE inhibitor or angiotensin II receptor blocker?  Yes  lisinopril (Prinivil)     Objective:    BP 120/70   Pulse 87   Ht 5\' 6"  (1.676 m)   Wt 214 lb 4 oz (97.2 kg)   BMI 34.58 kg/m   BMP Latest Ref Rng & Units 09/17/2016 09/03/2016 06/23/2016  Glucose 65 - 99 mg/dL 188(H) 147(H) 184(H)  BUN 8 - 27 mg/dL 9 10 16   Creatinine 0.57 - 1.00 mg/dL 0.75  0.79 0.82  BUN/Creat Ratio 12 - 28 12 13 20   Sodium 134 - 144 mmol/L 134 135 133(L)  Potassium 3.5 - 5.2 mmol/L 4.8 5.4(H) 5.2  Chloride 96 - 106 mmol/L 95(L) 93(L) 93(L)  CO2 20 - 29 mmol/L 23 25 24   Calcium 8.7 - 10.3 mg/dL 9.5 9.8 9.3   Last A1c = 7.6% (09/03/2016)    Assessment:   Type 2 DM, not  at goal - with complications noted above  Plan:    1.  Rx changes: d/c Trulicity;    Start Ozempic 0.5mg  SQ weekly for 1 month, then increase to 1mg  SQ weekly  Taught patient how to use Ozempic pen and gave #1 sample. 2.  Continue current low CHO diet as prescribed by metabolic clinic.  3.  Recommend check BG 1-2 times a day 4.  Recommended increase physical activity - goal is 150 minutes per week 5. Follow up: 1 month

## 2016-10-08 ENCOUNTER — Other Ambulatory Visit: Payer: Self-pay | Admitting: Family Medicine

## 2016-10-28 DIAGNOSIS — M48062 Spinal stenosis, lumbar region with neurogenic claudication: Secondary | ICD-10-CM | POA: Diagnosis not present

## 2016-10-28 DIAGNOSIS — G894 Chronic pain syndrome: Secondary | ICD-10-CM | POA: Diagnosis not present

## 2016-11-12 DIAGNOSIS — M170 Bilateral primary osteoarthritis of knee: Secondary | ICD-10-CM | POA: Diagnosis not present

## 2016-11-12 DIAGNOSIS — M1711 Unilateral primary osteoarthritis, right knee: Secondary | ICD-10-CM | POA: Diagnosis not present

## 2016-11-12 DIAGNOSIS — M1712 Unilateral primary osteoarthritis, left knee: Secondary | ICD-10-CM | POA: Diagnosis not present

## 2016-11-16 ENCOUNTER — Other Ambulatory Visit: Payer: Self-pay | Admitting: Pharmacist

## 2016-11-16 ENCOUNTER — Telehealth: Payer: Self-pay | Admitting: Family Medicine

## 2016-11-17 NOTE — Telephone Encounter (Signed)
Patient has lost about 20# and BG was getting low so she stopped glimepiride.  I advised that I agree that stopping glimepiride.  She will continue Ozempic 1mg  q week.  If she continues to have BG reading less than 70 then she will call office for instructions.

## 2016-11-19 ENCOUNTER — Encounter: Payer: Self-pay | Admitting: *Deleted

## 2016-12-03 ENCOUNTER — Ambulatory Visit: Payer: Self-pay | Admitting: Pharmacist

## 2016-12-03 ENCOUNTER — Other Ambulatory Visit: Payer: Self-pay | Admitting: Family Medicine

## 2016-12-16 ENCOUNTER — Ambulatory Visit (INDEPENDENT_AMBULATORY_CARE_PROVIDER_SITE_OTHER): Payer: Medicare Other | Admitting: Family Medicine

## 2016-12-16 ENCOUNTER — Encounter: Payer: Self-pay | Admitting: Family Medicine

## 2016-12-16 VITALS — BP 105/60 | HR 73 | Temp 97.5°F | Ht 66.0 in | Wt 192.0 lb

## 2016-12-16 DIAGNOSIS — E1149 Type 2 diabetes mellitus with other diabetic neurological complication: Secondary | ICD-10-CM

## 2016-12-16 DIAGNOSIS — I10 Essential (primary) hypertension: Secondary | ICD-10-CM | POA: Diagnosis not present

## 2016-12-16 DIAGNOSIS — E1169 Type 2 diabetes mellitus with other specified complication: Secondary | ICD-10-CM

## 2016-12-16 DIAGNOSIS — K219 Gastro-esophageal reflux disease without esophagitis: Secondary | ICD-10-CM | POA: Diagnosis not present

## 2016-12-16 DIAGNOSIS — M797 Fibromyalgia: Secondary | ICD-10-CM | POA: Diagnosis not present

## 2016-12-16 DIAGNOSIS — E559 Vitamin D deficiency, unspecified: Secondary | ICD-10-CM

## 2016-12-16 DIAGNOSIS — E785 Hyperlipidemia, unspecified: Secondary | ICD-10-CM

## 2016-12-16 NOTE — Patient Instructions (Addendum)
Medicare Annual Wellness Visit  [facility] and the medical providers at [facility] strive to bring you the best medical care.  In doing so we not only want to address your current medical conditions and concerns but also to detect new conditions early and prevent illness, disease and health-related problems.    Medicare offers a yearly Wellness Visit which allows our clinical staff to assess your need for preventative services including immunizations, lifestyle education, counseling to decrease risk of preventable diseases and screening for fall risk and other medical concerns.    This visit is provided free of charge (no copay) for all Medicare recipients. The clinical pharmacists at [facility] have begun to conduct these Wellness Visits which will also include a thorough review of all your medications.    As you primary medical provider recommend that you make an appointment for your Annual Wellness Visit if you have not done so already this year.  You may set up this appointment before you leave today or you may call back ([phone]) and schedule an appointment.  Please make sure when you call that you mention that you are scheduling your Annual Wellness Visit with the clinical pharmacist so that the appointment may be made for the proper length of time.     Continue current medications. Continue good therapeutic lifestyle changes which include good diet and exercise. Fall precautions discussed with patient. If an FOBT was given today- please return it to our front desk. If you are over 63 years old - you may need Prevnar 17 or the adult Pneumonia vaccine.  **Flu shots are available--- please call and schedule a FLU-CLINIC appointment**  After your visit with [place] today you will receive a survey in the mail or online from [employer] regarding your care with [place]. Please take a moment to fill this out. Your feedback is very  important to [place] as you can help [place] better understand your patient needs as well as improve your experience and satisfaction. WE CARE ABOUT YOU!!!   I am very proud of you with what you have accomplished. Continue with your current therapeutic lifestyle changes and ozempic treatment Continue with your metformin Continue with a regular exercise regimen and drinking plenty of water and fluids Don't forget to get your flu shot by the end of October Follow-up for your next colonoscopy next summer

## 2016-12-16 NOTE — Progress Notes (Signed)
Subjective:    [name], female    DOB: 08/18/1945, 71 y.o.   MRN: [id number]  HPI Pt here for follow up and management of chronic medical problems which includes diabetes, hyperlipidemia and hypertension. She is taking medication regularly.The patient has been followed for diabetes by the clinical pharmacists who was a certified diabetic educator. She has recently been changed to ozempic and she is also taking phentermine 37.5 to help with weight loss. She is on 1 mg of her new injectable glucagon-like peptide once weekly. I'm very proud of her in that she has lost from 214 pounds down 292 pounds. This is gotten to be good for her joint problems or back problems and her diabetes. She brings in blood sugars for review and the initial blood sugars were higher and they seem to have trended downward to the 04/26/1928 range in general and this is good. The patient is doing great. She has lost over 30 pounds of weight based on her records. She is not taking phentermine because it may play a role with her passing out but she may secondhand only think that she was dehydrated and not eating well and night sweats when she passed out. She is positive upbeat and definitely doing better with her back and her blood sugars. She denies any chest pain or shortness of breath. She denies any trouble with nausea vomiting blood in the stool or black tarry bowel movements. She does take a stool softener she recently reduced this to one daily because her stools were loose and she will reduce this further if the stools remain loose. She will keep drinking plenty of water and fluids. She is due her next colonoscopy in the summer of 2019 by Dr. Henrene Pastor. She has had polyps in the past. She denies any trouble passing her water.    Patient Active Problem List   Diagnosis Date Noted  . 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 Depression 02/28/2013  . GERD (gastroesophageal reflux disease) 02/28/2013  .  Hiatal hernia with gastroesophageal reflux 02/28/2013  . Peripheral neuropathy 02/28/2013  . DDD (degenerative disc disease), lumbosacral 06/23/2012  . Fibromyalgia syndrome 06/23/2012  . Essential hypertension, benign 06/23/2012  . Osteopenia 06/23/2012  . Diabetes ([place]) 06/23/2012  . Abnormal EKG 10/29/2010  . Obesity due to excess calories 10/29/2010  . 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 DYSPHAGIA 10/11/2009  . PERSONAL HISTORY OF COLONIC POLYPS 10/11/2009   Outpatient Encounter Prescriptions as of 12/16/2016  Medication Sig  . ALPRAZolam (XANAX) 0.5 MG tablet Take 1 tablet (0.5 mg total) by mouth at bedtime as needed.  . ARIPiprazole (ABILIFY) 2 MG tablet Take 1 tablet (2 mg total) by mouth daily.  . blood glucose meter kit and supplies KIT Dispense based on patient and insurance preference. Use up to two times daily as directed. (Dx: type 2 DM - E11.9)  . Blood Glucose Monitoring Suppl DEVI Blood glucose meter One Touch Brand. Use to check BS up to BID. DX 250.02  . cholecalciferol (VITAMIN D) 1000 UNITS tablet Take 2,000 Units by mouth daily.   Marland Kitchen conjugated estrogens (PREMARIN) vaginal cream Place 1 Applicatorful vaginally at bedtime as needed. Use q hs  . escitalopram (LEXAPRO) 20 MG tablet Take 1 tablet (20 mg total) by mouth daily.  Marland Kitchen esomeprazole (NEXIUM) 40 MG capsule TAKE 1 CAPSULE (40 MG TOTAL) BY MOUTH BID.  Marland Kitchen ezetimibe (ZETIA) 10 MG tablet TAKE 1 TABLET ONCE A DAY  . gabapentin (NEURONTIN) 600  MG tablet Take 1 tablet (600 mg total) by mouth 4 (four) times daily.  . Glucose Blood (BLOOD GLUCOSE TEST STRIPS) STRP Use to check BG qid.  DX:  E11.9  . glucose monitoring kit (FREESTYLE) monitoring kit Please dispense whichever glucometer patient's insurance will cover.  Use to check BG up to twice.  DX: 250.02  . HYDROcodone-acetaminophen (NORCO) 7.5-325 MG per tablet Take 1 tablet by mouth 2 (two) times daily as needed.   . Lancets 30G MISC Use to check BG twice  daily.  Dx:  250.02  . lisinopril-hydrochlorothiazide (PRINZIDE,ZESTORETIC) 20-25 MG tablet Take 1 tablet by mouth daily.  Marland Kitchen LIVALO 2 MG TABS TAKE  (1)  TABLET  EVERY OTHER DAY.  . metFORMIN (GLUCOPHAGE-XR) 500 MG 24 hr tablet Take 2 tablets (1,000 mg total) by mouth daily with breakfast.  . Omega-3 Fatty Acids (FISH OIL) 1000 MG CAPS Take by mouth 2 (two) times daily.    [name] DELICA LANCETS 32P MISC CHECK BLOOD SUGAR UP TO TWICE DAILY AS DIRECTED  . OZEMPIC 1 MG/DOSE SOPN INJECT 1MG ONCE A WEEK  . phentermine 37.5 MG capsule Take 18.75 mg by mouth every morning.  . [DISCONTINUED] ARIPiprazole (ABILIFY) 2 MG tablet Take 1 mg by mouth daily.   . [DISCONTINUED] cyclobenzaprine (FLEXERIL) 10 MG tablet Take 1 tablet (10 mg total) by mouth 3 (three) times daily as needed for muscle spasms.   No facility-administered encounter medications on file as of 12/16/2016.       Review of Systems  Constitutional: Negative.   HENT: Negative.   Eyes: Negative.   Respiratory: Negative.   Cardiovascular: Negative.   Gastrointestinal: Negative.   Endocrine: Negative.   Genitourinary: Negative.   Musculoskeletal: Negative.   Skin: Negative.   Allergic/Immunologic: Negative.   Neurological: Negative.   Hematological: Negative.   Psychiatric/Behavioral: Negative.        Objective:   Physical Exam  Constitutional: She is oriented to person, place, and time. She appears well-developed and well-nourished. No distress.  The patient is pleasant alert and positive and probably the happiest I have seen her in a long time.  HENT:  Head: Normocephalic and atraumatic.  Right Ear: External ear normal.  Left Ear: External ear normal.  Nose: Nose normal.  Mouth/Throat: Oropharynx is clear and moist. No oropharyngeal exudate.  Eyes: Pupils are equal, round, and reactive to light. Conjunctivae and EOM are normal. Right eye exhibits no discharge. Left eye exhibits no discharge. No scleral icterus.  Neck:  Normal range of motion. Neck supple. No thyromegaly present.  No bruits thyromegaly or anterior cervical adenopathy  Cardiovascular: Normal rate, regular rhythm, normal heart sounds and intact distal pulses.   No murmur heard. Heart is regular at 72/m  Pulmonary/Chest: Effort normal and breath sounds normal. No respiratory distress. She has no wheezes. She has no rales.  Clear anteriorly and posteriorly  Abdominal: Soft. Bowel sounds are normal. She exhibits no mass. There is no tenderness. There is no rebound and no guarding.  No abdominal tenderness masses or organ enlargement bruits or suprapubic tenderness.  Musculoskeletal: Normal range of motion. She exhibits no edema.  Lymphadenopathy:    She has no cervical adenopathy.  Neurological: She is alert and oriented to person, place, and time. She has normal reflexes. No cranial nerve deficit.  Skin: Skin is warm and dry. No rash noted.  Dry skin on toes of feet.  Psychiatric: She has a normal mood and affect. Her behavior is normal. Judgment and  thought content normal.  Nursing note and vitals reviewed.  BP 105/60 (BP Location: Left Arm)   Pulse 73   Temp (!) 97.5 F (36.4 C) (Oral)   Ht 5' 6"  (1.676 m)   Wt 192 lb (87.1 kg)   BMI 30.99 kg/m         Assessment & Plan:  1. Type 2 diabetes mellitus with other neurologic complication, without long-term current use of insulin ([place]) -Continue current treatment and therapeutic lifestyle changes pending results of lab work - CBC with Differential/Platelet; Future - Bayer DCA Hb A1c Waived; Future  2. Essential hypertension, benign -The blood pressure is great today and she will continue with current treatment - BMP8+EGFR; Future - CBC with Differential/Platelet; Future - Hepatic function panel; Future  3. Hyperlipidemia associated with type 2 diabetes mellitus ([place]) -Continue with therapeutic lifestyle changes Livalo and Zetia. Of course this is all pending results of lab  work. - CBC with Differential/Platelet; Future - Lipid panel; Future  4. Fibromyalgia syndrome -Continue with current treatment regimen - CBC with Differential/Platelet; Future  5. Vitamin D deficiency -Continue with vitamin D replacement pending results of lab work - CBC with Differential/Platelet; Future - VITAMIN D 25 Hydroxy (Vit-D Deficiency, Fractures); Future  6. Gastroesophageal reflux disease, esophagitis presence not specified -Continue to watch diet closely and continue to take any medicine that you're currently taking currently for her reflux. - CBC with Differential/Platelet; Future  No orders of the defined types were placed in this encounter.  Patient Instructions                       Medicare Annual Wellness Visit  [facility] and the medical providers at [facility] strive to bring you the best medical care.  In doing so we not only want to address your current medical conditions and concerns but also to detect new conditions early and prevent illness, disease and health-related problems.    Medicare offers a yearly Wellness Visit which allows our clinical staff to assess your need for preventative services including immunizations, lifestyle education, counseling to decrease risk of preventable diseases and screening for fall risk and other medical concerns.    This visit is provided free of charge (no copay) for all Medicare recipients. The clinical pharmacists at [facility] have begun to conduct these Wellness Visits which will also include a thorough review of all your medications.    As you primary medical provider recommend that you make an appointment for your Annual Wellness Visit if you have not done so already this year.  You may set up this appointment before you leave today or you may call back ([phone]) and schedule an appointment.  Please make sure when you call that you mention that you are scheduling your  Annual Wellness Visit with the clinical pharmacist so that the appointment may be made for the proper length of time.     Continue current medications. Continue good therapeutic lifestyle changes which include good diet and exercise. Fall precautions discussed with patient. If an FOBT was given today- please return it to our front desk. If you are over 22 years old - you may need Prevnar 34 or the adult Pneumonia vaccine.  **Flu shots are available--- please call and schedule a FLU-CLINIC appointment**  After your visit with [place] today you will receive a survey in the mail or online from [employer] regarding your care with [place]. Please take a moment to  fill this out. Your feedback is very important to [place] as you can help [place] better understand your patient needs as well as improve your experience and satisfaction. WE CARE ABOUT YOU!!!   I am very proud of you with what you have accomplished. Continue with your current therapeutic lifestyle changes and ozempic treatment Continue with your metformin Continue with a regular exercise regimen and drinking plenty of water and fluids Don't forget to get your flu shot by the end of October Follow-up for your next colonoscopy next summer    Arrie Senate MD

## 2016-12-22 ENCOUNTER — Ambulatory Visit (INDEPENDENT_AMBULATORY_CARE_PROVIDER_SITE_OTHER): Payer: Medicare Other | Admitting: Family

## 2016-12-22 ENCOUNTER — Encounter: Payer: Self-pay | Admitting: Family

## 2016-12-22 ENCOUNTER — Ambulatory Visit (HOSPITAL_COMMUNITY)
Admission: RE | Admit: 2016-12-22 | Discharge: 2016-12-22 | Disposition: A | Discharge status: Home / Self Care | Payer: Medicare Other | Source: Ambulatory Visit | Attending: Family | Admitting: Family

## 2016-12-22 ENCOUNTER — Other Ambulatory Visit: Payer: Medicare Other

## 2016-12-22 VITALS — BP 102/68 | HR 88 | Temp 97.2°F | Ht 66.0 in | Wt 192.0 lb

## 2016-12-22 DIAGNOSIS — E785 Hyperlipidemia, unspecified: Secondary | ICD-10-CM | POA: Diagnosis not present

## 2016-12-22 DIAGNOSIS — E1149 Type 2 diabetes mellitus with other diabetic neurological complication: Secondary | ICD-10-CM

## 2016-12-22 DIAGNOSIS — M79661 Pain in right lower leg: Secondary | ICD-10-CM

## 2016-12-22 DIAGNOSIS — M7989 Other specified soft tissue disorders: Secondary | ICD-10-CM

## 2016-12-22 DIAGNOSIS — M797 Fibromyalgia: Secondary | ICD-10-CM

## 2016-12-22 DIAGNOSIS — E1169 Type 2 diabetes mellitus with other specified complication: Secondary | ICD-10-CM

## 2016-12-22 DIAGNOSIS — I10 Essential (primary) hypertension: Secondary | ICD-10-CM | POA: Diagnosis not present

## 2016-12-22 DIAGNOSIS — E559 Vitamin D deficiency, unspecified: Secondary | ICD-10-CM

## 2016-12-22 DIAGNOSIS — K219 Gastro-esophageal reflux disease without esophagitis: Secondary | ICD-10-CM

## 2016-12-22 LAB — BAYER DCA HB A1C WAIVED: HB A1C (BAYER DCA - WAIVED): 5.8 % (ref <7.0)

## 2016-12-22 NOTE — Progress Notes (Signed)
   Subjective:    [name], female    DOB: 26-Dec-1945, 71 y.o.   MRN: [id number]  HPI Pt presents to the office today with right calf and leg swelling. Pt states she noticed this three days ago. Pt reports intermittent soreness in her right calf of 6 out 10. Pt states the pain is worse when she walks.   Pt denies any hx of DVT.    Review of Systems  Cardiovascular: Positive for leg swelling.  All other systems reviewed and are negative.      Objective:   Physical Exam  Constitutional: She is oriented to person, place, and time. She appears well-developed and well-nourished. No distress.  HENT:  Head: Normocephalic.  Cardiovascular: Normal rate, regular rhythm, normal heart sounds and intact distal pulses.   No murmur heard. Pulmonary/Chest: Effort normal and breath sounds normal. No respiratory distress. She has no wheezes.  Abdominal: Soft. Bowel sounds are normal. She exhibits no distension. There is no tenderness.  Musculoskeletal: Normal range of motion. She exhibits edema (2+ swelling in right LE, mild erythemas) and tenderness.  Positive Homan's sign  Neurological: She is alert and oriented to person, place, and time. No cranial nerve deficit.  Skin: Skin is warm and dry.  Psychiatric: She has a normal mood and affect. Her behavior is normal. Judgment and thought content normal.  Vitals reviewed.    BP 102/68   Pulse 88   Temp (!) 97.2 F (36.2 C) (Oral)   Ht 5\' 6"  (1.676 m)   Wt 192 lb (87.1 kg)   BMI 30.99 kg/m      Assessment & Plan:  1. Tenderness of right calf - US Venous Img Lower Unilateral Right; Future  2. Right leg swelling - US Venous Img Lower Unilateral Right; Future   [place] for DVT stat doppler in pending  Starter pack of xarelto given Any SOB or chest  Pain go to ED   Evelina Dun, FNP

## 2016-12-22 NOTE — Patient Instructions (Signed)
Deep Vein Thrombosis A deep vein thrombosis (DVT) is a blood clot (thrombus) that usually occurs in a deep, larger vein of the lower leg or the pelvis, or in an upper extremity such as the arm. These are dangerous and can lead to serious and even life-threatening complications if the clot travels to the lungs. A DVT can damage the valves in your leg veins so that instead of flowing upward, the blood pools in the lower leg. This is called post-thrombotic syndrome, and it can result in pain, swelling, discoloration, and sores on the leg. What are the causes? A DVT is caused by the formation of a blood clot in your leg, pelvis, or arm. Usually, several things contribute to the formation of blood clots. A clot may develop when:  Your blood flow slows down.  Your vein becomes damaged in some way.  You have a condition that makes your blood clot more easily.  What increases the risk? A DVT is more likely to develop in:  People who are older, especially over 60 years of age.  People who are overweight (obese).  People who sit or lie still for a long time, such as during long-distance travel (over 4 hours), bed rest, hospitalization, or during recovery from certain medical conditions like a stroke.  People who do not engage in much physical activity (sedentary lifestyle).  People who have chronic breathing disorders.  People who have a personal or family history of blood clots or blood clotting disease.  People who have peripheral vascular disease (PVD), diabetes, or some types of cancer.  People who have heart disease, especially if the person had a recent heart attack or has congestive heart failure.  People who have neurological diseases that affect the legs (leg paresis).  People who have had a traumatic injury, such as breaking a hip or leg.  People who have recently had major or lengthy surgery, especially on the hip, knee, or abdomen.  People who have had a central line placed  inside a large vein.  People who take medicines that contain the hormone estrogen. These include birth control pills and hormone replacement therapy.  Pregnancy or during childbirth or the postpartum period.  Long plane flights (over 8 hours).  What are the signs or symptoms?  Symptoms of a DVT can include:  Swelling of your leg or arm, especially if one side is much worse.  Warmth and redness of your leg or arm, especially if one side is much worse.  Pain in your arm or leg. If the clot is in your leg, symptoms may be more noticeable or worse when you stand or walk.  A feeling of pins and needles, if the clot is in the arm.  The symptoms of a DVT that has traveled to the lungs (pulmonary embolism, PE) usually start suddenly and include:  Shortness of breath while active or at rest.  Coughing or coughing up blood or blood-tinged mucus.  Chest pain that is often worse with deep breaths.  Rapid or irregular heartbeat.  Feeling light-headed or dizzy.  Fainting.  Feeling anxious.  Sweating.  There may also be pain and swelling in a leg if that is where the blood clot started. These symptoms may represent a serious problem that is an emergency. Do not wait to see if the symptoms will go away. Get medical help right away. Call your local emergency services (911 in the [place] Do not drive yourself to the hospital. How is this diagnosed? Your health   care provider will take a medical history and perform a physical exam. You may also have other tests, including:  Blood tests to assess the clotting properties of your blood.  Imaging tests, such as CT, ultrasound, MRI, X-ray, and other tests to see if you have clots anywhere in your body.  How is this treated? After a DVT is identified, it can be treated. The type of treatment that you receive depends on many factors, such as the cause of your DVT, your risk for bleeding or developing more clots, and other medical conditions that  you have. Sometimes, a combination of treatments is necessary. Treatment options may be combined and include:  Monitoring the blood clot with ultrasound.  Taking medicines by mouth, such as newer blood thinners (anticoagulants), thrombolytics, or warfarin.  Taking anticoagulant medicine by injection or through an IV tube.  Wearing compression stockings or using different types ofdevices.  Surgery (rare) to remove the blood clot or to place a filter in your abdomen to stop the blood clot from traveling to your lungs.  Treatments for a DVT are often divided into immediate treatment and long-term treatment (up to 3 months after DVT). You can work with your health care provider to choose the treatment program that is best for you. Follow these instructions at home: If you are taking a newer oral anticoagulant:  Take the medicine every single day at the same time each day.  Understand what foods and drugs interact with this medicine.  Understand that there are no regular blood tests required when using this medicine.  Understand the side effects of this medicine, including excessive bruising or bleeding. Ask your health care provider or pharmacist about other possible side effects. If you are taking warfarin:  Understand how to take warfarin and know which foods can affect how warfarin works in your body.  Understand that it is dangerous to take too much or too little warfarin. Too much warfarin increases the risk of bleeding. Too little warfarin continues to allow the risk for blood clots.  Follow your PT and INR blood testing schedule. The PT and INR results allow your health care provider to adjust your dose of warfarin. It is very important that you have your PT and INR tested as often as told by your health care provider.  Avoid major changes in your diet, or tell your health care provider before you change your diet. Arrange a visit with a registered dietitian to answer your  questions. Many foods, especially foods that are high in vitamin K, can interfere with warfarin and affect the PT and INR results. Eat a consistent amount of foods that are high in vitamin K, such as: ? Spinach, kale, broccoli, cabbage, collard greens, turnip greens, Brussels sprouts, peas, cauliflower, seaweed, and parsley. ? Beef liver and pork liver. ? Green tea. ? Soybean oil.  Tell your health care provider about any and all medicines, vitamins, and supplements that you take, including aspirin and other over-the-counter anti-inflammatory medicines. Be especially cautious with aspirin and anti-inflammatory medicines. Do not take those before you ask your health care provider if it is safe to do so. This is important because many medicines can interfere with warfarin and affect the PT and INR results.  Do not start or stop taking any over-the-counter or prescription medicine unless your health care provider or pharmacist tells you to do so. If you take warfarin, you will also need to do these things:  Hold pressure over cuts for longer than   usual.  Tell your dentist and other health care providers that you are taking warfarin before you have any procedures in which bleeding may occur.  Avoid alcohol or drink very small amounts. Tell your health care provider if you change your alcohol intake.  Do not use tobacco products, including cigarettes, chewing tobacco, and e-cigarettes. If you need help quitting, ask your health care provider.  Avoid contact sports.  General instructions  Take over-the-counter and prescription medicines only as told by your health care provider. Anticoagulant medicines can have side effects, including easy bruising and difficulty stopping bleeding. If you are prescribed an anticoagulant, you will also need to do these things: ? Hold pressure over cuts for longer than usual. ? Tell your dentist and other health care providers that you are taking anticoagulants  before you have any procedures in which bleeding may occur. ? Avoid contact sports.  Wear a medical alert bracelet or carry a medical alert card that says you have had a PE.  Ask your health care provider how soon you can go back to your normal activities. Stay active to prevent new blood clots from forming.  Make sure to exercise while traveling or when you have been sitting or standing for a long period of time. It is very important to exercise. Exercise your legs by walking or by tightening and relaxing your leg muscles often. Take frequent walks.  Wear compression stockings as told by your health care provider to help prevent more blood clots from forming.  Do not use tobacco products, including cigarettes, chewing tobacco, and e-cigarettes. If you need help quitting, ask your health care provider.  Keep all follow-up appointments with your health care provider. This is important. How is this prevented? Take these actions to decrease your risk of developing another DVT:  Exercise regularly. For at least 30 minutes every day, engage in: ? Activity that involves moving your arms and legs. ? Activity that encourages good blood flow through your body by increasing your heart rate.  Exercise your arms and legs every hour during long-distance travel (over 4 hours). Drink plenty of water and avoid drinking alcohol while traveling.  Avoid sitting or lying in bed for long periods of time without moving your legs.  Maintain a weight that is appropriate for your height. Ask your health care provider what weight is healthy for you.  If you are a woman who is over 35 years of age, avoid unnecessary use of medicines that contain estrogen. These include birth control pills.  Do not smoke, especially if you take estrogen medicines. If you need help quitting, ask your health care provider.  If you are hospitalized, prevention measures may include:  Early walking after surgery, as soon as your  health care provider says that it is safe.  Receiving anticoagulants to prevent blood clots.If you cannot take anticoagulants, other options may be available, such as wearing compression stockings or using different types of devices.  Get help right away if:  You have new or increased pain, swelling, or redness in an arm or leg.  You have numbness or tingling in an arm or leg.  You have shortness of breath while active or at rest.  You have chest pain.  You have a rapid or irregular heartbeat.  You feel light-headed or dizzy.  You cough up blood.  You notice blood in your vomit, bowel movement, or urine. These symptoms may represent a serious problem that is an emergency. Do not wait to see   if the symptoms will go away. Get medical help right away. Call your local emergency services (911 in the [place] Do not drive yourself to the hospital. This information is not intended to replace advice given to you by your health care provider. Make sure you discuss any questions you have with your health care provider. Document Released: 03/23/2005 Document Revised: 08/29/2015 Document Reviewed: 07/18/2014 Elsevier Interactive Patient Education  2017 [employer]  

## 2016-12-22 NOTE — Progress Notes (Signed)
Patient aware. Will call back if does not improve.

## 2016-12-23 LAB — LIPID PANEL
CHOL/HDL RATIO: 2.6 ratio (ref 0.0–4.4)
Cholesterol, Total: 129 mg/dL (ref 100–199)
HDL: 49 mg/dL (ref >39)
LDL Calculated: 53 mg/dL (ref 0–99)
TRIGLYCERIDES: 137 mg/dL (ref 0–149)
VLDL Cholesterol Cal: 27 mg/dL (ref 5–40)

## 2016-12-23 LAB — CBC WITH DIFFERENTIAL/PLATELET
BASOS: 0 %
Basophils Absolute: 0 10*3/uL (ref 0.0–0.2)
EOS (ABSOLUTE): 0.1 10*3/uL (ref 0.0–0.4)
Eos: 1 %
HEMATOCRIT: 40.1 % (ref 34.0–46.6)
Hemoglobin: 13.2 g/dL (ref 11.1–15.9)
IMMATURE GRANS (ABS): 0 10*3/uL (ref 0.0–0.1)
Immature Granulocytes: 0 %
LYMPHS ABS: 2.6 10*3/uL (ref 0.7–3.1)
Lymphs: 34 %
MCH: 28.7 pg (ref 26.6–33.0)
MCHC: 32.9 g/dL (ref 31.5–35.7)
MCV: 87 fL (ref 79–97)
Monocytes Absolute: 0.7 10*3/uL (ref 0.1–0.9)
Monocytes: 9 %
Neutrophils Absolute: 4.2 10*3/uL (ref 1.4–7.0)
Neutrophils: 56 %
Platelets: 250 10*3/uL (ref 150–379)
RBC: 4.6 x10E6/uL (ref 3.77–5.28)
RDW: 15.9 % — AB (ref 12.3–15.4)
WBC: 7.6 10*3/uL (ref 3.4–10.8)

## 2016-12-23 LAB — BMP8+EGFR
BUN/Creatinine Ratio: 13 (ref 12–28)
BUN: 9 mg/dL (ref 8–27)
CO2: 26 mmol/L (ref 20–29)
Calcium: 9.6 mg/dL (ref 8.7–10.3)
Chloride: 93 mmol/L — ABNORMAL LOW (ref 96–106)
Creatinine, Ser: 0.72 mg/dL (ref 0.57–1.00)
GFR calc Af Amer: 97 mL/min/{1.73_m2} (ref >59)
GFR, EST NON AFRICAN AMERICAN: 85 mL/min/{1.73_m2} (ref >59)
Glucose: 107 mg/dL — ABNORMAL HIGH (ref 65–99)
POTASSIUM: 5 mmol/L (ref 3.5–5.2)
SODIUM: 134 mmol/L (ref 134–144)

## 2016-12-23 LAB — HEPATIC FUNCTION PANEL
ALT: 21 IU/L (ref 0–32)
AST: 20 IU/L (ref 0–40)
Albumin: 4.4 g/dL (ref 3.5–4.8)
Alkaline Phosphatase: 86 IU/L (ref 39–117)
Bilirubin Total: 0.3 mg/dL (ref 0.0–1.2)
Bilirubin, Direct: 0.12 mg/dL (ref 0.00–0.40)
Total Protein: 6.9 g/dL (ref 6.0–8.5)

## 2016-12-23 LAB — VITAMIN D 25 HYDROXY (VIT D DEFICIENCY, FRACTURES): VIT D 25 HYDROXY: 42.2 ng/mL (ref 30.0–100.0)

## 2017-01-02 ENCOUNTER — Other Ambulatory Visit: Payer: Self-pay | Admitting: Family Medicine

## 2017-01-21 ENCOUNTER — Ambulatory Visit: Payer: Medicare Other | Admitting: Family Medicine

## 2017-01-28 DIAGNOSIS — G894 Chronic pain syndrome: Secondary | ICD-10-CM | POA: Diagnosis not present

## 2017-02-04 DIAGNOSIS — Z23 Encounter for immunization: Secondary | ICD-10-CM | POA: Diagnosis not present

## 2017-02-24 ENCOUNTER — Other Ambulatory Visit: Payer: Self-pay | Admitting: Family Medicine

## 2017-02-26 ENCOUNTER — Other Ambulatory Visit: Payer: Self-pay | Admitting: Family Medicine

## 2017-03-02 ENCOUNTER — Other Ambulatory Visit: Payer: Self-pay | Admitting: Family Medicine

## 2017-03-03 DIAGNOSIS — E119 Type 2 diabetes mellitus without complications: Secondary | ICD-10-CM | POA: Diagnosis not present

## 2017-03-16 ENCOUNTER — Other Ambulatory Visit: Payer: Self-pay | Admitting: Family Medicine

## 2017-03-25 ENCOUNTER — Other Ambulatory Visit: Payer: Self-pay | Admitting: Family Medicine

## 2017-03-31 ENCOUNTER — Other Ambulatory Visit: Payer: Self-pay | Admitting: Family Medicine

## 2017-04-01 ENCOUNTER — Other Ambulatory Visit: Payer: Self-pay | Admitting: Family Medicine

## 2017-04-24 ENCOUNTER — Other Ambulatory Visit: Payer: Self-pay | Admitting: Family Medicine

## 2017-04-29 DIAGNOSIS — M5136 Other intervertebral disc degeneration, lumbar region: Secondary | ICD-10-CM | POA: Diagnosis not present

## 2017-05-13 ENCOUNTER — Other Ambulatory Visit: Payer: Self-pay | Admitting: Family Medicine

## 2017-05-19 ENCOUNTER — Other Ambulatory Visit: Payer: Self-pay | Admitting: Family Medicine

## 2017-05-21 ENCOUNTER — Other Ambulatory Visit: Payer: Self-pay | Admitting: Family Medicine

## 2017-06-07 ENCOUNTER — Telehealth: Payer: Self-pay | Admitting: Family Medicine

## 2017-06-07 NOTE — Telephone Encounter (Signed)
Apt made for pt

## 2017-06-08 ENCOUNTER — Other Ambulatory Visit: Payer: Self-pay | Admitting: Family Medicine

## 2017-06-08 NOTE — Telephone Encounter (Signed)
Last seen 12/22/16  Mariana Kaufman PCP

## 2017-06-08 NOTE — Telephone Encounter (Signed)
Last refill without being seen 

## 2017-06-09 ENCOUNTER — Other Ambulatory Visit: Payer: Self-pay | Admitting: Family Medicine

## 2017-06-10 ENCOUNTER — Encounter: Payer: Self-pay | Admitting: Family Medicine

## 2017-06-10 ENCOUNTER — Ambulatory Visit: Payer: Medicare Other | Admitting: Family Medicine

## 2017-06-10 VITALS — BP 137/75 | HR 76 | Temp 97.0°F | Ht 66.0 in | Wt 190.0 lb

## 2017-06-10 DIAGNOSIS — M5137 Other intervertebral disc degeneration, lumbosacral region: Secondary | ICD-10-CM

## 2017-06-10 DIAGNOSIS — E559 Vitamin D deficiency, unspecified: Secondary | ICD-10-CM

## 2017-06-10 DIAGNOSIS — E785 Hyperlipidemia, unspecified: Secondary | ICD-10-CM

## 2017-06-10 DIAGNOSIS — E1169 Type 2 diabetes mellitus with other specified complication: Secondary | ICD-10-CM

## 2017-06-10 DIAGNOSIS — K219 Gastro-esophageal reflux disease without esophagitis: Secondary | ICD-10-CM | POA: Diagnosis not present

## 2017-06-10 DIAGNOSIS — M797 Fibromyalgia: Secondary | ICD-10-CM

## 2017-06-10 DIAGNOSIS — E1149 Type 2 diabetes mellitus with other diabetic neurological complication: Secondary | ICD-10-CM | POA: Diagnosis not present

## 2017-06-10 DIAGNOSIS — I10 Essential (primary) hypertension: Secondary | ICD-10-CM | POA: Diagnosis not present

## 2017-06-10 LAB — BAYER DCA HB A1C WAIVED: HB A1C (BAYER DCA - WAIVED): 6.3 % (ref <7.0)

## 2017-06-10 MED ORDER — PITAVASTATIN CALCIUM 2 MG PO TABS: ORAL_TABLET | ORAL | 3 refills | Status: DC

## 2017-06-10 MED ORDER — ESCITALOPRAM OXALATE 20 MG PO TABS: 20.0000 mg | ORAL_TABLET | Freq: Every day | ORAL | 3 refills | Status: DC

## 2017-06-10 MED ORDER — METFORMIN HCL ER 500 MG PO TB24: ORAL_TABLET | ORAL | 3 refills | Status: DC

## 2017-06-10 MED ORDER — GABAPENTIN 600 MG PO TABS: 600.0000 mg | ORAL_TABLET | Freq: Four times a day (QID) | ORAL | 3 refills | Status: DC

## 2017-06-10 MED ORDER — EZETIMIBE 10 MG PO TABS: 10.0000 mg | ORAL_TABLET | Freq: Every day | ORAL | 3 refills | Status: DC

## 2017-06-10 MED ORDER — ESOMEPRAZOLE MAGNESIUM 40 MG PO CPDR: DELAYED_RELEASE_CAPSULE | ORAL | 3 refills | Status: DC

## 2017-06-10 MED ORDER — LISINOPRIL-HYDROCHLOROTHIAZIDE 20-25 MG PO TABS: 1.0000 | ORAL_TABLET | Freq: Every day | ORAL | 3 refills | Status: DC

## 2017-06-10 MED ORDER — ARIPIPRAZOLE 2 MG PO TABS: 2.0000 mg | ORAL_TABLET | Freq: Every day | ORAL | 3 refills | Status: DC

## 2017-06-10 NOTE — Patient Instructions (Addendum)
Medicare Annual Wellness Visit  [facility] and the medical providers at [facility] strive to bring you the best medical care.  In doing so we not only want to address your current medical conditions and concerns but also to detect new conditions early and prevent illness, disease and health-related problems.    Medicare offers a yearly Wellness Visit which allows our clinical staff to assess your need for preventative services including immunizations, lifestyle education, counseling to decrease risk of preventable diseases and screening for fall risk and other medical concerns.    This visit is provided free of charge (no copay) for all Medicare recipients. The clinical pharmacists at [facility] have begun to conduct these Wellness Visits which will also include a thorough review of all your medications.    As you primary medical provider recommend that you make an appointment for your Annual Wellness Visit if you have not done so already this year.  You may set up this appointment before you leave today or you may call back ([phone]) and schedule an appointment.  Please make sure when you call that you mention that you are scheduling your Annual Wellness Visit with the clinical pharmacist so that the appointment may be made for the proper length of time.     Continue current medications. Continue good therapeutic lifestyle changes which include good diet and exercise. Fall precautions discussed with patient. If an FOBT was given today- please return it to our front desk. If you are over 86 years old - you may need Prevnar 45 or the adult Pneumonia vaccine.  **Flu shots are available--- please call and schedule a FLU-CLINIC appointment**  After your visit with [place] today you will receive a survey in the mail or online from [employer] regarding your care with [place]. Please take a moment to fill this out. Your feedback is very  important to [place] as you can help [place] better understand your patient needs as well as improve your experience and satisfaction. WE CARE ABOUT YOU!!!   Continue to follow-up with the orthopedic surgeon Stay active physically and keep working on weight through diet and exercise Try melatonin 3 mg at night to see if this will help you sleep better and continue to avoid caffeine

## 2017-06-10 NOTE — Progress Notes (Signed)
Subjective:    [name], female    DOB: 1946-02-26, 72 y.o.   MRN: [id number]  HPI  Pt here for follow up and management of chronic medical problems which includes diabetes, hypertension and hyperlipidemia. She is taking medication regularly.  The patient is doing well overall and her only complaint today is sleep issues.  She has lost a couple more pounds since her last visit.  She is followed regularly by the orthopedic surgeon because of chronic back pain issues and fibromyalgia.  She is due to get a DEXA scan but prefers to wait for this.  She will be given an FOBT to return and will get her regular lab work today.  She is also requesting refills on all of her medicines.  The patient is pleasant and doing well.  She denies any chest pain pressure tightness or shortness of breath.  She does have occasional indigestion but takes a Gas-X for this.  She has irritable bowel syndrome symptoms that she has had for years and waffles between loose stools and constipation.  She does take Perdiem for her stool softener.  She denies any blood in the stool or black tarry bowel movements.  She is due to get a colonoscopy late this summer because of a history of polyps.  She drinks plenty of water and has no trouble passing her water.    Patient Active Problem List   Diagnosis Date Noted  . 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 Depression 02/28/2013  . GERD (gastroesophageal reflux disease) 02/28/2013  . Hiatal hernia with gastroesophageal reflux 02/28/2013  . Peripheral neuropathy 02/28/2013  . DDD (degenerative disc disease), lumbosacral 06/23/2012  . Fibromyalgia syndrome 06/23/2012  . Essential hypertension, benign 06/23/2012  . Osteopenia 06/23/2012  . Diabetes ([place]) 06/23/2012  . Abnormal EKG 10/29/2010  . Obesity due to excess calories 10/29/2010  . 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 DYSPHAGIA 10/11/2009  . PERSONAL HISTORY OF COLONIC POLYPS  10/11/2009   Outpatient Encounter Medications as of 06/10/2017  Medication Sig  . ALPRAZolam (XANAX) 0.5 MG tablet TAKE 1 TABLET AT BEDTIME  . ARIPiprazole (ABILIFY) 2 MG tablet Take 1 tablet (2 mg total) by mouth daily.  . blood glucose meter kit and supplies KIT Dispense based on patient and insurance preference. Use up to two times daily as directed. (Dx: type 2 DM - E11.9)  . Blood Glucose Monitoring Suppl DEVI Blood glucose meter One Touch Brand. Use to check BS up to BID. DX 250.02  . cholecalciferol (VITAMIN D) 1000 UNITS tablet Take 2,000 Units by mouth daily.   Marland Kitchen escitalopram (LEXAPRO) 20 MG tablet Take 1 tablet (20 mg total) by mouth daily.  Marland Kitchen esomeprazole (NEXIUM) 40 MG capsule TAKE  (1)  CAPSULE  TWICE DAILY.  Marland Kitchen ezetimibe (ZETIA) 10 MG tablet TAKE 1 TABLET ONCE A DAY  . gabapentin (NEURONTIN) 600 MG tablet Take 1 tablet (600 mg total) by mouth 4 (four) times daily.  Marland Kitchen glucose blood (ONE TOUCH ULTRA TEST) test strip CHECK BLOOD SUGAR UP TO 2 TIMES A DAY  . glucose monitoring kit (FREESTYLE) monitoring kit Please dispense whichever glucometer patient's insurance will cover.  Use to check BG up to twice.  DX: 250.02  . HYDROcodone-acetaminophen (NORCO) 7.5-325 MG per tablet Take 1 tablet by mouth 2 (two) times daily as needed.   . Lancets 30G MISC Use to check BG twice daily.  Dx:  250.02  . lisinopril-hydrochlorothiazide (PRINZIDE,ZESTORETIC) 20-25 MG tablet Take 1  tablet by mouth daily.  Marland Kitchen lisinopril-hydrochlorothiazide (PRINZIDE,ZESTORETIC) 20-25 MG tablet Take 1 tablet by mouth daily.  Marland Kitchen LIVALO 2 MG TABS TAKE  (1)  TABLET  EVERY OTHER DAY.  . metFORMIN (GLUCOPHAGE-XR) 500 MG 24 hr tablet Take 2 tablets (1,000 mg total) by mouth daily with breakfast.  . Omega-3 Fatty Acids (FISH OIL) 1000 MG CAPS Take by mouth 2 (two) times daily.    [name] DELICA LANCETS 88Q MISC CHECK BLOOD SUGAR UP TO TWICE DAILY  . OZEMPIC 1 MG/DOSE SOPN INJECT 1MG (0.75ML) ONCE A WEEK  . phentermine 37.5  MG capsule Take 18.75 mg by mouth every morning.  Marland Kitchen PREMARIN vaginal cream Place 1 Applicatorful vaginally at bedtime as needed.   No facility-administered encounter medications on file as of 06/10/2017.       Review of Systems  Constitutional: Negative.   HENT: Negative.   Eyes: Negative.   Respiratory: Negative.   Cardiovascular: Negative.   Gastrointestinal: Negative.   Endocrine: Negative.   Genitourinary: Negative.   Musculoskeletal: Negative.   Skin: Negative.   Allergic/Immunologic: Negative.   Neurological: Negative.   Hematological: Negative.   Psychiatric/Behavioral: Negative.        Sleep issues        Objective:   Physical Exam  Constitutional: She is oriented to person, place, and time. She appears well-developed and well-nourished. No distress.  The patient is pleasant and alert and in good spirits.  HENT:  Head: Normocephalic and atraumatic.  Right Ear: External ear normal.  Left Ear: External ear normal.  Nose: Nose normal.  Mouth/Throat: Oropharynx is clear and moist. No oropharyngeal exudate.  Eyes: Conjunctivae and EOM are normal. Pupils are equal, round, and reactive to light. Right eye exhibits no discharge. Left eye exhibits no discharge. No scleral icterus.  Neck: Normal range of motion. Neck supple. No thyromegaly present.  No bruits thyromegaly or anterior cervical adenopathy  Cardiovascular: Normal rate, regular rhythm, normal heart sounds and intact distal pulses.  No murmur heard. The heart is regular at 72/min  Pulmonary/Chest: Effort normal and breath sounds normal. No respiratory distress. She has no wheezes. She has no rales.  Clear anteriorly and posteriorly  Abdominal: Soft. Bowel sounds are normal. She exhibits no mass. There is no tenderness. There is no rebound and no guarding.  No masses tenderness or organ enlargement bruits or inguinal adenopathy  Musculoskeletal: Normal range of motion. She exhibits no edema.  Patient's mobility  seems improved with laying down and sitting up and with her general movements in the exam room  Lymphadenopathy:    She has no cervical adenopathy.  Neurological: She is alert and oriented to person, place, and time. She has normal reflexes. No cranial nerve deficit.  Skin: Skin is warm and dry. No rash noted.  Psychiatric: She has a normal mood and affect. Her behavior is normal. Judgment and thought content normal.  Nursing note and vitals reviewed.   BP 137/75 (BP Location: Left Arm)   Pulse 76   Temp (!) 97 F (36.1 C) (Oral)   Ht 5' 6"  (1.676 m)   Wt 190 lb (86.2 kg)   BMI 30.67 kg/m        Assessment & Plan:  1. Type 2 diabetes mellitus with other neurologic complication, without long-term current use of insulin (HCC) -The patient is doing well with her diabetes.  She says that a recent fasting blood sugar which she does not check often because her sugars have been so well  controlled was about 101. - CBC with Differential/Platelet - Bayer DCA Hb A1c Waived  2. Hyperlipidemia associated with type 2 diabetes mellitus ([place]) -Continue with current treatment pending results of lab work - CBC with Differential/Platelet - Lipid panel  3. Essential hypertension, benign -Blood pressure good control and continue with current treatment - BMP8+EGFR - CBC with Differential/Platelet - Hepatic function panel  4. Fibromyalgia syndrome -The patient is doing well with this and will continue with current treatment regimen - CBC with Differential/Platelet  5. Vitamin D deficiency -Continue with current treatment pending results of lab work - CBC with Differential/Platelet - VITAMIN D 25 Hydroxy (Vit-D Deficiency, Fractures)  6. Gastroesophageal reflux disease, esophagitis presence not specified -Continue with Nexium, Gas-X and as needed Zantac - CBC with Differential/Platelet - Hepatic function panel  7. DDD (degenerative disc disease), lumbosacral -Continue follow-up with  orthopedic surgeon  Meds ordered this encounter  Medications  . metFORMIN (GLUCOPHAGE-XR) 500 MG 24 hr tablet    Sig: Take 2 tablets (1,000 mg total) by mouth daily with breakfast.    Dispense:  180 tablet    Refill:  3  . Pitavastatin Calcium (LIVALO) 2 MG TABS    Sig: TAKE  (1)  TABLET  EVERY OTHER DAY.    Dispense:  45 tablet    Refill:  3  . lisinopril-hydrochlorothiazide (PRINZIDE,ZESTORETIC) 20-25 MG tablet    Sig: Take 1 tablet by mouth daily.    Dispense:  90 tablet    Refill:  3  . gabapentin (NEURONTIN) 600 MG tablet    Sig: Take 1 tablet (600 mg total) by mouth 4 (four) times daily.    Dispense:  360 tablet    Refill:  3  . ezetimibe (ZETIA) 10 MG tablet    Sig: Take 1 tablet (10 mg total) by mouth daily.    Dispense:  90 tablet    Refill:  3  . esomeprazole (NEXIUM) 40 MG capsule    Sig: TAKE  (1)  CAPSULE  TWICE DAILY.    Dispense:  180 capsule    Refill:  3  . escitalopram (LEXAPRO) 20 MG tablet    Sig: Take 1 tablet (20 mg total) by mouth daily.    Dispense:  90 tablet    Refill:  3  . ARIPiprazole (ABILIFY) 2 MG tablet    Sig: Take 1 tablet (2 mg total) by mouth daily.    Dispense:  90 tablet    Refill:  3   Patient Instructions                       Medicare Annual Wellness Visit  [facility] and the medical providers at [facility] strive to bring you the best medical care.  In doing so we not only want to address your current medical conditions and concerns but also to detect new conditions early and prevent illness, disease and health-related problems.    Medicare offers a yearly Wellness Visit which allows our clinical staff to assess your need for preventative services including immunizations, lifestyle education, counseling to decrease risk of preventable diseases and screening for fall risk and other medical concerns.    This visit is provided free of charge (no copay) for all Medicare recipients. The clinical pharmacists at  [facility] have begun to conduct these Wellness Visits which will also include a thorough review of all your medications.    As you primary medical provider recommend that you  make an appointment for your Annual Wellness Visit if you have not done so already this year.  You may set up this appointment before you leave today or you may call back ([phone]) and schedule an appointment.  Please make sure when you call that you mention that you are scheduling your Annual Wellness Visit with the clinical pharmacist so that the appointment may be made for the proper length of time.     Continue current medications. Continue good therapeutic lifestyle changes which include good diet and exercise. Fall precautions discussed with patient. If an FOBT was given today- please return it to our front desk. If you are over 7 years old - you may need Prevnar 69 or the adult Pneumonia vaccine.  **Flu shots are available--- please call and schedule a FLU-CLINIC appointment**  After your visit with [place] today you will receive a survey in the mail or online from [employer] regarding your care with [place]. Please take a moment to fill this out. Your feedback is very important to [place] as you can help [place] better understand your patient needs as well as improve your experience and satisfaction. WE CARE ABOUT YOU!!!   Continue to follow-up with the orthopedic surgeon Stay active physically and keep working on weight through diet and exercise Try melatonin 3 mg at night to see if this will help you sleep better and continue to avoid caffeine    Arrie Senate MD

## 2017-06-11 LAB — HEPATIC FUNCTION PANEL
ALK PHOS: 80 IU/L (ref 39–117)
ALT: 18 IU/L (ref 0–32)
AST: 18 IU/L (ref 0–40)
Albumin: 4.4 g/dL (ref 3.5–4.8)
BILIRUBIN TOTAL: 0.3 mg/dL (ref 0.0–1.2)
BILIRUBIN, DIRECT: 0.11 mg/dL (ref 0.00–0.40)
Total Protein: 6.6 g/dL (ref 6.0–8.5)

## 2017-06-11 LAB — LIPID PANEL
CHOLESTEROL TOTAL: 166 mg/dL (ref 100–199)
Chol/HDL Ratio: 2.5 ratio (ref 0.0–4.4)
HDL: 66 mg/dL (ref >39)
LDL Calculated: 81 mg/dL (ref 0–99)
TRIGLYCERIDES: 94 mg/dL (ref 0–149)
VLDL CHOLESTEROL CAL: 19 mg/dL (ref 5–40)

## 2017-06-11 LAB — CBC WITH DIFFERENTIAL/PLATELET
BASOS ABS: 0 10*3/uL (ref 0.0–0.2)
BASOS: 0 %
EOS (ABSOLUTE): 0.1 10*3/uL (ref 0.0–0.4)
Eos: 1 %
Hematocrit: 39.6 % (ref 34.0–46.6)
Hemoglobin: 13 g/dL (ref 11.1–15.9)
IMMATURE GRANS (ABS): 0 10*3/uL (ref 0.0–0.1)
IMMATURE GRANULOCYTES: 0 %
LYMPHS: 39 %
Lymphocytes Absolute: 2.8 10*3/uL (ref 0.7–3.1)
MCH: 30 pg (ref 26.6–33.0)
MCHC: 32.8 g/dL (ref 31.5–35.7)
MCV: 91 fL (ref 79–97)
Monocytes Absolute: 0.7 10*3/uL (ref 0.1–0.9)
Monocytes: 10 %
NEUTROS PCT: 50 %
Neutrophils Absolute: 3.4 10*3/uL (ref 1.4–7.0)
PLATELETS: 227 10*3/uL (ref 150–379)
RBC: 4.34 x10E6/uL (ref 3.77–5.28)
RDW: 13.7 % (ref 12.3–15.4)
WBC: 7 10*3/uL (ref 3.4–10.8)

## 2017-06-11 LAB — BMP8+EGFR
BUN/Creatinine Ratio: 14 (ref 12–28)
BUN: 10 mg/dL (ref 8–27)
CALCIUM: 9.5 mg/dL (ref 8.7–10.3)
CHLORIDE: 92 mmol/L — AB (ref 96–106)
CO2: 25 mmol/L (ref 20–29)
Creatinine, Ser: 0.69 mg/dL (ref 0.57–1.00)
GFR calc Af Amer: 101 mL/min/{1.73_m2} (ref >59)
GFR, EST NON AFRICAN AMERICAN: 88 mL/min/{1.73_m2} (ref >59)
GLUCOSE: 102 mg/dL — AB (ref 65–99)
Potassium: 4.8 mmol/L (ref 3.5–5.2)
Sodium: 131 mmol/L — ABNORMAL LOW (ref 134–144)

## 2017-06-11 LAB — VITAMIN D 25 HYDROXY (VIT D DEFICIENCY, FRACTURES): Vit D, 25-Hydroxy: 33.5 ng/mL (ref 30.0–100.0)

## 2017-06-29 ENCOUNTER — Other Ambulatory Visit: Payer: Self-pay | Admitting: Nurse Practitioner

## 2017-07-07 ENCOUNTER — Other Ambulatory Visit: Payer: Self-pay | Admitting: Nurse Practitioner

## 2017-07-13 ENCOUNTER — Telehealth: Payer: Self-pay | Admitting: Family Medicine

## 2017-07-13 NOTE — Telephone Encounter (Signed)
Pt aware  - may have DEXA

## 2017-07-29 DIAGNOSIS — G894 Chronic pain syndrome: Secondary | ICD-10-CM | POA: Diagnosis not present

## 2017-08-10 ENCOUNTER — Telehealth: Payer: Self-pay

## 2017-08-10 NOTE — Telephone Encounter (Signed)
Please confirm that she is taking this medication and her current strength and make sure it gets in the computer

## 2017-08-10 NOTE — Telephone Encounter (Signed)
Pt and pharm aware -- no longer on Trulicity and is now on Ozempic

## 2017-08-10 NOTE — Telephone Encounter (Signed)
Getting a prior auth for Trulicity   I do not see this on her med list  Please advise?

## 2017-09-06 ENCOUNTER — Other Ambulatory Visit: Payer: Self-pay | Admitting: Nurse Practitioner

## 2017-09-13 DIAGNOSIS — Z1231 Encounter for screening mammogram for malignant neoplasm of breast: Secondary | ICD-10-CM | POA: Diagnosis not present

## 2017-09-15 DIAGNOSIS — L821 Other seborrheic keratosis: Secondary | ICD-10-CM | POA: Diagnosis not present

## 2017-09-15 DIAGNOSIS — Z85828 Personal history of other malignant neoplasm of skin: Secondary | ICD-10-CM | POA: Diagnosis not present

## 2017-09-15 DIAGNOSIS — L570 Actinic keratosis: Secondary | ICD-10-CM | POA: Diagnosis not present

## 2017-09-15 DIAGNOSIS — D229 Melanocytic nevi, unspecified: Secondary | ICD-10-CM | POA: Diagnosis not present

## 2017-10-05 ENCOUNTER — Other Ambulatory Visit: Payer: Self-pay | Admitting: Family Medicine

## 2017-10-06 DIAGNOSIS — H5203 Hypermetropia, bilateral: Secondary | ICD-10-CM | POA: Diagnosis not present

## 2017-10-06 DIAGNOSIS — H25813 Combined forms of age-related cataract, bilateral: Secondary | ICD-10-CM | POA: Diagnosis not present

## 2017-10-06 DIAGNOSIS — E119 Type 2 diabetes mellitus without complications: Secondary | ICD-10-CM | POA: Diagnosis not present

## 2017-10-06 DIAGNOSIS — Z7984 Long term (current) use of oral hypoglycemic drugs: Secondary | ICD-10-CM | POA: Diagnosis not present

## 2017-10-06 LAB — HM DIABETES EYE EXAM

## 2017-10-29 ENCOUNTER — Ambulatory Visit: Payer: Medicare Other | Admitting: Family Medicine

## 2017-11-03 ENCOUNTER — Other Ambulatory Visit: Payer: Self-pay | Admitting: Family Medicine

## 2017-11-03 NOTE — Telephone Encounter (Signed)
OV 12/08/17 

## 2017-11-04 DIAGNOSIS — M25561 Pain in right knee: Secondary | ICD-10-CM | POA: Diagnosis not present

## 2017-11-04 DIAGNOSIS — M170 Bilateral primary osteoarthritis of knee: Secondary | ICD-10-CM | POA: Diagnosis not present

## 2017-11-04 DIAGNOSIS — Z471 Aftercare following joint replacement surgery: Secondary | ICD-10-CM | POA: Diagnosis not present

## 2017-11-04 DIAGNOSIS — Z96653 Presence of artificial knee joint, bilateral: Secondary | ICD-10-CM | POA: Insufficient documentation

## 2017-11-09 ENCOUNTER — Other Ambulatory Visit (HOSPITAL_COMMUNITY): Payer: Self-pay | Admitting: Orthopedic Surgery

## 2017-11-09 DIAGNOSIS — M25561 Pain in right knee: Secondary | ICD-10-CM

## 2017-11-11 ENCOUNTER — Encounter: Payer: Self-pay | Admitting: Internal Medicine

## 2017-11-12 ENCOUNTER — Other Ambulatory Visit: Payer: Self-pay | Admitting: Family Medicine

## 2017-11-12 ENCOUNTER — Other Ambulatory Visit: Payer: Self-pay

## 2017-11-12 DIAGNOSIS — Z1239 Encounter for other screening for malignant neoplasm of breast: Secondary | ICD-10-CM

## 2017-11-18 ENCOUNTER — Ambulatory Visit (HOSPITAL_COMMUNITY)
Admission: RE | Admit: 2017-11-18 | Discharge: 2017-11-18 | Disposition: A | Discharge status: Home / Self Care | Payer: Medicare Other | Source: Ambulatory Visit | Attending: Orthopedic Surgery | Admitting: Orthopedic Surgery

## 2017-11-18 DIAGNOSIS — Z96651 Presence of right artificial knee joint: Secondary | ICD-10-CM | POA: Diagnosis not present

## 2017-11-18 DIAGNOSIS — Z471 Aftercare following joint replacement surgery: Secondary | ICD-10-CM | POA: Diagnosis not present

## 2017-11-18 DIAGNOSIS — M25561 Pain in right knee: Secondary | ICD-10-CM | POA: Insufficient documentation

## 2017-11-18 MED ORDER — TECHNETIUM TC 99M MEDRONATE IV KIT
20.0000 | PACK | Freq: Once | INTRAVENOUS | Status: AC | PRN
  Administered 2017-11-18: 20.8 via INTRAVENOUS

## 2017-11-22 ENCOUNTER — Telehealth: Payer: Self-pay | Admitting: Family Medicine

## 2017-11-22 NOTE — Telephone Encounter (Signed)
That will have to be addressed by DR. Laurance Flatten

## 2017-11-23 NOTE — Telephone Encounter (Signed)
Please call and find out what the patient needs to take 2 a day of instead of 1 a day.

## 2017-11-24 NOTE — Telephone Encounter (Signed)
LM 8/21-jhb

## 2017-11-26 DIAGNOSIS — M1711 Unilateral primary osteoarthritis, right knee: Secondary | ICD-10-CM | POA: Diagnosis not present

## 2017-11-26 DIAGNOSIS — M170 Bilateral primary osteoarthritis of knee: Secondary | ICD-10-CM | POA: Diagnosis not present

## 2017-12-02 DIAGNOSIS — G894 Chronic pain syndrome: Secondary | ICD-10-CM | POA: Diagnosis not present

## 2017-12-02 DIAGNOSIS — M545 Low back pain: Secondary | ICD-10-CM | POA: Diagnosis not present

## 2017-12-08 ENCOUNTER — Encounter: Payer: Self-pay | Admitting: Family Medicine

## 2017-12-08 ENCOUNTER — Ambulatory Visit: Payer: Medicare Other | Admitting: Family Medicine

## 2017-12-08 ENCOUNTER — Ambulatory Visit (INDEPENDENT_AMBULATORY_CARE_PROVIDER_SITE_OTHER): Payer: Medicare Other

## 2017-12-08 VITALS — BP 92/63 | HR 91 | Temp 97.9°F | Ht 66.0 in | Wt 198.0 lb

## 2017-12-08 DIAGNOSIS — I700 Atherosclerosis of aorta: Secondary | ICD-10-CM

## 2017-12-08 DIAGNOSIS — E1169 Type 2 diabetes mellitus with other specified complication: Secondary | ICD-10-CM

## 2017-12-08 DIAGNOSIS — Z13820 Encounter for screening for osteoporosis: Secondary | ICD-10-CM

## 2017-12-08 DIAGNOSIS — I10 Essential (primary) hypertension: Secondary | ICD-10-CM | POA: Diagnosis not present

## 2017-12-08 DIAGNOSIS — M5137 Other intervertebral disc degeneration, lumbosacral region: Secondary | ICD-10-CM

## 2017-12-08 DIAGNOSIS — K219 Gastro-esophageal reflux disease without esophagitis: Secondary | ICD-10-CM

## 2017-12-08 DIAGNOSIS — E889 Metabolic disorder, unspecified: Secondary | ICD-10-CM

## 2017-12-08 DIAGNOSIS — Z780 Asymptomatic menopausal state: Secondary | ICD-10-CM | POA: Diagnosis not present

## 2017-12-08 DIAGNOSIS — E1149 Type 2 diabetes mellitus with other diabetic neurological complication: Secondary | ICD-10-CM

## 2017-12-08 DIAGNOSIS — E785 Hyperlipidemia, unspecified: Secondary | ICD-10-CM

## 2017-12-08 DIAGNOSIS — G63 Polyneuropathy in diseases classified elsewhere: Secondary | ICD-10-CM

## 2017-12-08 DIAGNOSIS — M85852 Other specified disorders of bone density and structure, left thigh: Secondary | ICD-10-CM | POA: Diagnosis not present

## 2017-12-08 DIAGNOSIS — M797 Fibromyalgia: Secondary | ICD-10-CM

## 2017-12-08 DIAGNOSIS — E559 Vitamin D deficiency, unspecified: Secondary | ICD-10-CM

## 2017-12-08 LAB — BAYER DCA HB A1C WAIVED: HB A1C: 6.8 % (ref <7.0)

## 2017-12-08 NOTE — Patient Instructions (Addendum)
Medicare Annual Wellness Visit  [facility] and the medical providers at [facility] strive to bring you the best medical care.  In doing so we not only want to address your current medical conditions and concerns but also to detect new conditions early and prevent illness, disease and health-related problems.    Medicare offers a yearly Wellness Visit which allows our clinical staff to assess your need for preventative services including immunizations, lifestyle education, counseling to decrease risk of preventable diseases and screening for fall risk and other medical concerns.    This visit is provided free of charge (no copay) for all Medicare recipients. The clinical pharmacists at [facility] have begun to conduct these Wellness Visits which will also include a thorough review of all your medications.    As you primary medical provider recommend that you make an appointment for your Annual Wellness Visit if you have not done so already this year.  You may set up this appointment before you leave today or you may call back [phone]) and schedule an appointment.  Please make sure when you call that you mention that you are scheduling your Annual Wellness Visit with the clinical pharmacist so that the appointment may be made for the proper length of time.     Continue current medications. Continue good therapeutic lifestyle changes which include good diet and exercise. Fall precautions discussed with patient. If an FOBT was given today- please return it to our front desk. If you are over 72 years old - you may need Prevnar 77 or the adult Pneumonia vaccine.  **Flu shots are available--- please call and schedule a FLU-CLINIC appointment**  After your visit with [place] today you will receive a survey in the mail or online from [employer] regarding your care with [place]. Please take a moment to fill this out. Your feedback is very  important to [place] as you can help [place] better understand your patient needs as well as improve your experience and satisfaction. WE CARE ABOUT YOU!!!   We will go ahead and arrange for a cardiac clearance because of increased risk factors for heart disease in light of upcoming knee replacement surgery. Continue to drink plenty of fluids and stay well-hydrated Reduce the sugar in your diet as much as possible To take 600 mg of calcium twice daily and continue to take vitamin D3 Weightbearing exercise to strengthen his bones and do as much weightbearing exercise as possible Take Tylenol if needed for pain Reduce caffeine intake Follow-up with orthopedist as planned Continue to check blood sugars and feet regularly Continue to see ophthalmologist yearly and make sure they send [place] a copy of the report.

## 2017-12-08 NOTE — Progress Notes (Addendum)
Subjective:    [name], female    DOB: 08/19/1945, 72 y.o.   MRN: [id number]  HPI Pt here for follow up and management of chronic medical problems which includes hyperlipidemia, diabetes and hypertension. She is taking medication regularly.  The patient is doing well overall.  She is diabetic.  She sees the orthopedic group in [place] regularly for pain management with her back and joints.  She is due to get a chest x-ray today a DEXA scan and will be given an FOBT to return along with getting lab work.  She complains of not sleeping well.  She today denies any chest pain pressure tightness or shortness of breath anymore than usual.  She does take Nexium for reflux.  She says she has trouble swallowing occasionally and food will come back up periodically she cannot say that this is any worse than usual.  She is going to have a colonoscopy soon and for the pre-colonoscopy visit I did ask her to discuss the swallowing issue with her gastroenterologist.  He may want to do an endoscopy at the same time he does the colonoscopy and do a dilatation if necessary.  She denies any blood in the stool or change in bowel habits other than alternating constipation and loose stools.  She has to take pain medicine for her joint issues and understands that this can contribute to constipation.  She is passing her water without problems.  She gets her eye exams done yearly.  The patient today does complain of some occasional numbness in her feet.  Her sensation will be checked later during the visit.  Her blood sugars at home of been running fasting between 130 and 135.    Patient Active Problem List   Diagnosis Date Noted  . 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 Depression 02/28/2013  . GERD (gastroesophageal reflux disease) 02/28/2013  . Hiatal hernia with gastroesophageal reflux 02/28/2013  . Peripheral neuropathy 02/28/2013  . DDD (degenerative disc disease), lumbosacral  06/23/2012  . Fibromyalgia syndrome 06/23/2012  . Essential hypertension, benign 06/23/2012  . Osteopenia 06/23/2012  . Diabetes ([place]) 06/23/2012  . Abnormal EKG 10/29/2010  . Obesity due to excess calories 10/29/2010  . 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 DYSPHAGIA 10/11/2009  . PERSONAL HISTORY OF COLONIC POLYPS 10/11/2009   Outpatient Encounter Medications as of 12/08/2017  Medication Sig  . ALPRAZolam (XANAX) 0.5 MG tablet TAKE 1 TABLET AT BEDTIME  . ARIPiprazole (ABILIFY) 2 MG tablet Take 1 tablet (2 mg total) by mouth daily.  . blood glucose meter kit and supplies KIT Dispense based on patient and insurance preference. Use up to two times daily as directed. (Dx: type 2 DM - E11.9)  . Blood Glucose Monitoring Suppl DEVI Blood glucose meter One Touch Brand. Use to check BS up to BID. DX 250.02  . cholecalciferol (VITAMIN D) 1000 UNITS tablet Take 2,000 Units by mouth daily.   Marland Kitchen escitalopram (LEXAPRO) 20 MG tablet Take 1 tablet (20 mg total) by mouth daily.  Marland Kitchen esomeprazole (NEXIUM) 40 MG capsule TAKE  (1)  CAPSULE  TWICE DAILY.  Marland Kitchen ezetimibe (ZETIA) 10 MG tablet Take 1 tablet (10 mg total) by mouth daily.  Marland Kitchen gabapentin (NEURONTIN) 600 MG tablet Take 1 tablet (600 mg total) by mouth 4 (four) times daily.  Marland Kitchen glucose blood (ONE TOUCH ULTRA TEST) test strip CHECK BLOOD SUGAR UP TO 2 TIMES A DAY  . glucose monitoring kit (FREESTYLE) monitoring kit Please dispense whichever  glucometer patient's insurance will cover.  Use to check BG up to twice.  DX: 250.02  . HYDROcodone-acetaminophen (NORCO) 7.5-325 MG per tablet Take 1 tablet by mouth 2 (two) times daily as needed.   . Lancets 30G MISC Use to check BG twice daily.  Dx:  250.02  . lisinopril-hydrochlorothiazide (PRINZIDE,ZESTORETIC) 20-25 MG tablet Take 1 tablet by mouth daily.  . metFORMIN (GLUCOPHAGE-XR) 500 MG 24 hr tablet Take 2 tablets (1,000 mg total) by mouth daily with breakfast.  . Omega-3 Fatty Acids (FISH OIL) 1000 MG  CAPS Take by mouth 2 (two) times daily.    [name] DELICA LANCETS 03K MISC CHECK BLOOD SUGAR UP TO TWICE DAILY  . OZEMPIC 1 MG/DOSE SOPN INJECT 1MG (0.75ML) ONCE A WEEK  . Pitavastatin Calcium (LIVALO) 2 MG TABS TAKE  (1)  TABLET  EVERY OTHER DAY.  Marland Kitchen PREMARIN vaginal cream Place 1 Applicatorful vaginally at bedtime as needed.  . [DISCONTINUED] lisinopril-hydrochlorothiazide (PRINZIDE,ZESTORETIC) 20-25 MG tablet Take 1 tablet by mouth daily.  . [DISCONTINUED] phentermine 37.5 MG capsule Take 18.75 mg by mouth every morning.   No facility-administered encounter medications on file as of 12/08/2017.      Review of Systems  Constitutional: Negative.        Not sleeping well   HENT: Negative.   Eyes: Negative.   Respiratory: Negative.   Cardiovascular: Negative.   Gastrointestinal: Negative.   Endocrine: Negative.   Genitourinary: Negative.   Musculoskeletal: Positive for arthralgias (right knee issues - may have surgery again- Dr Elmyra Ricks ).  Skin: Negative.   Allergic/Immunologic: Negative.   Neurological: Negative.   Hematological: Negative.   Psychiatric/Behavioral: Negative.        Objective:   Physical Exam  Constitutional: She is oriented to person, place, and time. She appears well-developed and well-nourished. No distress.  Patient looks great and is pleasant and alert  HENT:  Head: Normocephalic and atraumatic.  Right Ear: External ear normal.  Left Ear: External ear normal.  Nose: Nose normal.  Mouth/Throat: Oropharynx is clear and moist. No oropharyngeal exudate.  Eyes: Pupils are equal, round, and reactive to light. Conjunctivae and EOM are normal. Right eye exhibits no discharge. Left eye exhibits no discharge. No scleral icterus.  Continue to get eye exams yearly  Neck: Normal range of motion. Neck supple. No thyromegaly present.  No bruits thyromegaly or anterior cervical adenopathy  Cardiovascular: Normal rate, regular rhythm, normal heart sounds and intact  distal pulses.  No murmur heard. The heart was regular at 72/min  Pulmonary/Chest: Effort normal and breath sounds normal. She has no wheezes. She has no rales.  Clear anteriorly and posteriorly  Abdominal: Soft. Bowel sounds are normal. She exhibits no mass. There is tenderness. There is no rebound and no guarding.  Slight epigastric tenderness with no liver or spleen enlargement.  No bruits.  No inguinal adenopathy or suprapubic tenderness or masses detected.  Musculoskeletal: She exhibits tenderness. She exhibits no edema.  The patient has had both knees replaced.  The right knee will be redone sometime soon.  She has trouble sitting and laying and getting up from a supine position because of back pain.  Lymphadenopathy:    She has no cervical adenopathy.  Neurological: She is alert and oriented to person, place, and time. She has normal reflexes. No cranial nerve deficit.  Cranial nerves II through XII were intact and reflexes were equal in both lower extremities.  Skin: Skin is warm and dry. No rash noted.  Psychiatric:  She has a normal mood and affect. Her behavior is normal. Judgment and thought content normal.  The patient's mood affect and behavior were normal for her.  Nursing note and vitals reviewed.  BP 92/63 (BP Location: Left Arm)   Pulse 91   Temp 97.9 F (36.6 C) (Oral)   Ht _0  (1.676 m)   Wt 198 lb (89.8 kg)   BMI 31.96 kg/m         Assessment & Plan:  1. Screening for osteoporosis -The results were reviewed with the patient during the visit and she does have osteopenia with a T score of -1.7.  She was encouraged to do weightbearing exercise as much as possible take at least 600 mg of calcium twice daily and continue with vitamin D3.  She was also encouraged to remove throw rugs from the house and keep the house well lit so as not to fall. - DG [facility] DEXA; Future - CBC with Differential/Platelet  2. Postmenopausal -Patient has osteopenia - DG [facility] DEXA;  Future - CBC with Differential/Platelet  3. Hyperlipidemia associated with type 2 diabetes mellitus ([place]) -Continue with current treatment and as aggressive therapeutic lifestyle changes as possible pending results of lab work - [employer] 2 View; Future - CBC with Differential/Platelet - Lipid panel  4. Essential hypertension, benign -The blood pressure is good today and she will continue with current treatment - DG Chest 2 View; Future - BMP8+EGFR - CBC with Differential/Platelet - Hepatic function panel  5. Type 2 diabetes mellitus with other neurologic complication, without long-term current use of insulin ([place]) -Continue with current treatment pending results of lab work - CBC with Differential/Platelet - [place] Hb A1c Waived  6. Fibromyalgia syndrome -Continue with pain management as recommended by orthopedics - CBC with Differential/Platelet  7. Vitamin D deficiency -Continue with vitamin D replacement pending results of lab work - CBC with Differential/Platelet - VITAMIN D 25 Hydroxy (Vit-D Deficiency, Fractures)  8. Gastroesophageal reflux disease, esophagitis presence not specified -Continue with Nexium and discuss problems with swallowing with gastroenterologist prior to getting colonoscopy.  Patient may need endoscopy and dilatation also. - CBC with Differential/Platelet - Hepatic function panel  9. DDD (degenerative disc disease), lumbosacral -Continue with follow-up by orthopedics  10. 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   11.  Aortic atherosclerosis -Per chest x-ray on 12/08/2017 -Patient will be encouraged even more to work on weight through diet and exercise  Patient Instructions                       Medicare Annual Wellness Visit  [facility] and the medical providers at [facility] strive to bring you the best medical  care.  In doing so we not only want to address your current medical conditions and concerns but also to detect new conditions early and prevent illness, disease and health-related problems.    Medicare offers a yearly Wellness Visit which allows our clinical staff to assess your need for preventative services including immunizations, lifestyle education, counseling to decrease risk of preventable diseases and screening for fall risk and other medical concerns.    This visit is provided free of charge (no copay) for all Medicare recipients. The clinical pharmacists at [facility] have begun to conduct these Wellness Visits which will also include a thorough review of all your medications.  As you primary medical provider recommend that you make an appointment for your Annual Wellness Visit if you have not done so already this year.  You may set up this appointment before you leave today or you may call back ([phone]) and schedule an appointment.  Please make sure when you call that you mention that you are scheduling your Annual Wellness Visit with the clinical pharmacist so that the appointment may be made for the proper length of time.     Continue current medications. Continue good therapeutic lifestyle changes which include good diet and exercise. Fall precautions discussed with patient. If an FOBT was given today- please return it to our front desk. If you are over 26 years old - you may need Prevnar 75 or the adult Pneumonia vaccine.  **Flu shots are available--- please call and schedule a FLU-CLINIC appointment**  After your visit with [place] today you will receive a survey in the mail or online from [employer] regarding your care with [place]. Please take a moment to fill this out. Your feedback is very important to [place] as you can help [place] better understand your patient needs as well as improve your experience and satisfaction. WE CARE ABOUT YOU!!!   We will go ahead and  arrange for a cardiac clearance because of increased risk factors for heart disease in light of upcoming knee replacement surgery. Continue to drink plenty of fluids and stay well-hydrated Reduce the sugar in your diet as much as possible To take 600 mg of calcium twice daily and continue to take vitamin D3 Weightbearing exercise to strengthen his bones and do as much weightbearing exercise as possible Take Tylenol if needed for pain Reduce caffeine intake Follow-up with orthopedist as planned Continue to check blood sugars and feet regularly Continue to see ophthalmologist yearly and make sure they send [place] a copy of the report.   Arrie Senate MD

## 2017-12-09 LAB — BMP8+EGFR
BUN / CREAT RATIO: 12 (ref 12–28)
BUN: 9 mg/dL (ref 8–27)
CALCIUM: 10 mg/dL (ref 8.7–10.3)
CHLORIDE: 95 mmol/L — AB (ref 96–106)
CO2: 25 mmol/L (ref 20–29)
Creatinine, Ser: 0.77 mg/dL (ref 0.57–1.00)
GFR calc Af Amer: 89 mL/min/{1.73_m2} (ref >59)
GFR calc non Af Amer: 77 mL/min/{1.73_m2} (ref >59)
GLUCOSE: 94 mg/dL (ref 65–99)
POTASSIUM: 4.6 mmol/L (ref 3.5–5.2)
Sodium: 135 mmol/L (ref 134–144)

## 2017-12-09 LAB — CBC WITH DIFFERENTIAL/PLATELET
BASOS ABS: 0 10*3/uL (ref 0.0–0.2)
Basos: 0 %
EOS (ABSOLUTE): 0.1 10*3/uL (ref 0.0–0.4)
Eos: 2 %
Hematocrit: 40.2 % (ref 34.0–46.6)
Hemoglobin: 13.3 g/dL (ref 11.1–15.9)
Immature Grans (Abs): 0 10*3/uL (ref 0.0–0.1)
Immature Granulocytes: 0 %
LYMPHS ABS: 2.7 10*3/uL (ref 0.7–3.1)
LYMPHS: 35 %
MCH: 29.8 pg (ref 26.6–33.0)
MCHC: 33.1 g/dL (ref 31.5–35.7)
MCV: 90 fL (ref 79–97)
Monocytes Absolute: 0.8 10*3/uL (ref 0.1–0.9)
Monocytes: 10 %
NEUTROS ABS: 4.2 10*3/uL (ref 1.4–7.0)
Neutrophils: 53 %
PLATELETS: 236 10*3/uL (ref 150–450)
RBC: 4.47 x10E6/uL (ref 3.77–5.28)
RDW: 14 % (ref 12.3–15.4)
WBC: 7.9 10*3/uL (ref 3.4–10.8)

## 2017-12-09 LAB — HEPATIC FUNCTION PANEL
ALBUMIN: 4.2 g/dL (ref 3.5–4.8)
ALK PHOS: 69 IU/L (ref 39–117)
ALT: 23 IU/L (ref 0–32)
AST: 24 IU/L (ref 0–40)
BILIRUBIN, DIRECT: 0.08 mg/dL (ref 0.00–0.40)
Bilirubin Total: 0.3 mg/dL (ref 0.0–1.2)
Total Protein: 6.9 g/dL (ref 6.0–8.5)

## 2017-12-09 LAB — LIPID PANEL
Chol/HDL Ratio: 3 ratio (ref 0.0–4.4)
Cholesterol, Total: 155 mg/dL (ref 100–199)
HDL: 52 mg/dL (ref >39)
LDL Calculated: 67 mg/dL (ref 0–99)
Triglycerides: 180 mg/dL — ABNORMAL HIGH (ref 0–149)
VLDL Cholesterol Cal: 36 mg/dL (ref 5–40)

## 2017-12-09 LAB — VITAMIN D 25 HYDROXY (VIT D DEFICIENCY, FRACTURES): Vit D, 25-Hydroxy: 41.6 ng/mL (ref 30.0–100.0)

## 2017-12-29 ENCOUNTER — Other Ambulatory Visit: Payer: Self-pay | Admitting: Family Medicine

## 2017-12-31 ENCOUNTER — Ambulatory Visit (INDEPENDENT_AMBULATORY_CARE_PROVIDER_SITE_OTHER): Payer: Medicare Other | Admitting: Family

## 2017-12-31 ENCOUNTER — Encounter: Payer: Self-pay | Admitting: Family

## 2017-12-31 VITALS — BP 125/76 | HR 88 | Temp 98.1°F | Ht 66.0 in | Wt 202.4 lb

## 2017-12-31 DIAGNOSIS — Z01419 Encounter for gynecological examination (general) (routine) without abnormal findings: Secondary | ICD-10-CM | POA: Diagnosis not present

## 2017-12-31 DIAGNOSIS — Z23 Encounter for immunization: Secondary | ICD-10-CM

## 2017-12-31 NOTE — Progress Notes (Signed)
   Subjective:    [name], female    DOB: 17-Apr-1945, 72 y.o.   MRN: [id number]  Chief Complaint  Patient presents with  . Gynecologic Exam    PT presents to the office today for Pap. She is followed by Dr. Laurance Flatten every 3-4 months for chronic.  Gynecologic Exam  The patient's pertinent negatives include no genital itching, genital lesions, genital odor, vaginal bleeding or vaginal discharge. The patient is experiencing no pain. Pertinent negatives include no painful intercourse. She is sexually active.      Review of Systems  Genitourinary: Negative for vaginal discharge.  All other systems reviewed and are negative.      Objective:   Physical Exam  Constitutional: She is oriented to person, place, and time. She appears well-developed and well-nourished. No distress.  HENT:  Head: Normocephalic and atraumatic.  Right Ear: External ear normal.  Left Ear: External ear normal.  Mouth/Throat: Oropharynx is clear and moist.  Eyes: Pupils are equal, round, and reactive to light.  Neck: Normal range of motion. Neck supple. No thyromegaly present.  Cardiovascular: Normal rate, regular rhythm, normal heart sounds and intact distal pulses.  No murmur heard. Pulmonary/Chest: Effort normal and breath sounds normal. No respiratory distress. She has no wheezes. She exhibits no mass, no tenderness, no laceration, no crepitus, no edema, no deformity, no swelling and no retraction.  Abdominal: Soft. Bowel sounds are normal. She exhibits no distension. There is no tenderness.  Genitourinary: Vagina normal.  Genitourinary Comments: Bimanual exam- no adnexal masses or tenderness, ovaries nonpalpable   Cervix parous and pink- No discharge   Musculoskeletal: Normal range of motion. She exhibits no edema or tenderness.  Neurological: She is alert and oriented to person, place, and time. She has normal reflexes. No cranial nerve deficit.  Skin: Skin is warm and dry.  Psychiatric:  She has a normal mood and affect. Her behavior is normal. Judgment and thought content normal.  Vitals reviewed.    BP 125/76   Pulse 88   Temp 98.1 F (36.7 C) (Oral)   Ht 5\' 6"  (1.676 m)   Wt 202 lb 6.4 oz (91.8 kg)   BMI 32.67 kg/m      Assessment & Plan:  DESIRRE EICKHOFF comes in today with chief complaint of Gynecologic Exam   Diagnosis and orders addressed:  1. Gynecologic exam normal Discussed since her last pap was normal and her age this could be her last pap Continue premarin  - IGP, Aptima HPV, rfx 16/18,45    Health Maintenance reviewed-Flu vaccine given today Diet and exercise encouraged  Follow up plan: As needed    Evelina Dun, FNP

## 2017-12-31 NOTE — Patient Instructions (Signed)

## 2018-01-04 LAB — IGP, APTIMA HPV, RFX 16/18,45
HPV APTIMA: NEGATIVE
PAP SMEAR COMMENT: 0

## 2018-01-19 ENCOUNTER — Telehealth: Payer: Self-pay | Admitting: Family Medicine

## 2018-01-19 DIAGNOSIS — I10 Essential (primary) hypertension: Secondary | ICD-10-CM

## 2018-01-19 DIAGNOSIS — E785 Hyperlipidemia, unspecified: Secondary | ICD-10-CM

## 2018-01-19 DIAGNOSIS — E1149 Type 2 diabetes mellitus with other diabetic neurological complication: Secondary | ICD-10-CM

## 2018-01-19 DIAGNOSIS — E1169 Type 2 diabetes mellitus with other specified complication: Secondary | ICD-10-CM

## 2018-01-19 DIAGNOSIS — Z01818 Encounter for other preprocedural examination: Secondary | ICD-10-CM

## 2018-01-19 NOTE — Telephone Encounter (Signed)
Pt checking on clearance for surgery - she will need cardiac clearance 1st --- referral placed to cardio.

## 2018-01-24 ENCOUNTER — Telehealth: Payer: Self-pay | Admitting: Family Medicine

## 2018-01-24 NOTE — Telephone Encounter (Signed)
Pt aware that referral is in progress

## 2018-01-24 NOTE — Telephone Encounter (Signed)
Still in process   Just put in on Wednesday

## 2018-01-25 ENCOUNTER — Telehealth: Payer: Self-pay | Admitting: Family Medicine

## 2018-01-25 NOTE — Telephone Encounter (Signed)
Pt aware under review

## 2018-02-14 ENCOUNTER — Other Ambulatory Visit: Payer: Self-pay | Admitting: Family Medicine

## 2018-02-14 NOTE — Telephone Encounter (Signed)
PT is needing to talk to [name] about sending a medicine in and other things she said. That was all info that the pt wanted to give me.

## 2018-02-14 NOTE — Telephone Encounter (Signed)
Pt called and states that in years past she took xanax BID and then we switched her to 1- QHS.  She is having a lot of new anxiety with her upcoming surgery. She would like to have some extra to have on hand some days for that 1 extra IF she needs it.  I set up a rx for 1-2 daily PRN, but only #135 (for a 90 day supply) instead of #180.  If you ok with that , please sign pended med and it will go to [facility].

## 2018-02-15 MED ORDER — ALPRAZOLAM 0.5 MG PO TABS: ORAL_TABLET | ORAL | 1 refills | Status: DC

## 2018-02-25 ENCOUNTER — Encounter: Payer: Self-pay | Admitting: Cardiology

## 2018-02-25 ENCOUNTER — Ambulatory Visit: Payer: Medicare Other | Admitting: Cardiology

## 2018-02-25 VITALS — BP 120/72 | HR 93 | Resp 16 | Ht 66.0 in | Wt 200.0 lb

## 2018-02-25 DIAGNOSIS — Z6832 Body mass index (BMI) 32.0-32.9, adult: Secondary | ICD-10-CM

## 2018-02-25 DIAGNOSIS — E6609 Other obesity due to excess calories: Secondary | ICD-10-CM | POA: Diagnosis not present

## 2018-02-25 DIAGNOSIS — Z7189 Other specified counseling: Secondary | ICD-10-CM

## 2018-02-25 DIAGNOSIS — Z01810 Encounter for preprocedural cardiovascular examination: Secondary | ICD-10-CM

## 2018-02-25 DIAGNOSIS — I700 Atherosclerosis of aorta: Secondary | ICD-10-CM | POA: Diagnosis not present

## 2018-02-25 NOTE — Progress Notes (Signed)
Cardiology Office Note:    Date:  02/25/2018   ID:  [name], DOB 1945-06-20, MRN [id number]  PCP:  Chipper Herb, MD  Cardiologist:  Buford Dresser, MD PhD  Referring MD: Chipper Herb, MD   Chief Complaint  Patient presents with  . Medical Clearance    History of Present Illness:    [name] is a 72 y.o. female with a hx of hyperlipidemia, diabetes, and hypertension who is seen as a new consult at the request of Chipper Herb, MD for the evaluation and management of preoperative evaluation.  No preoperative request available today. Per the patient, surgery is scheduled for 03/09/18, for knee replacement (right knee, at site of prior replacement). Going to be performed at [facility] by Dr. Wynelle Link. Has had spinal anesthesia for knee procedures in the past.  Has occasional indigestion, resolves quickly with Gas-X. No chest pressure.  Preoperative cardiovascular evaluation Pertinent past cardiac history: none Prior cardiac workup: stress test 10-15 years ago (Dr. Percival Spanish), no echo. History of valve disease: none History of CAD/PAD/CVA/TIA: none History of heart failure: none History of arrhythmia: none On anticoagulation: no Additional history (hypertension, diabetes/on insulin, CKD/current creatinine, OSA, anesthesia complications): HTN well controlled, diabetes not on isulin, no CKD, has been told she snores, sleep study was normal. No anesthesia complications Current symptoms: no chest pain, shortness of breath, PND, orthopnea. Had syncope with a diet pill two years ago, none since.  Functional capacity: walking to shop, can climbs stairs without issue (limited by knees)  Past Medical History:  Diagnosis Date  . Arthritis    [place]   . Cancer ([place])   . Colon polyps   . Depression   . Diabetes mellitus, type 2 ([place]) 06/2010  . Diverticulosis   . Esophageal stricture   . Fibromyalgia    [place]   . GERD (gastroesophageal reflux disease)     . Hemorrhoids   . Hiatal hernia   . Hyperlipidemia   . Hypertension   . Menopause   . Peripheral vascular insufficiency ([place])   . Vitamin D deficiency     Past Surgical History:  Procedure Laterality Date  . ABDOMINAL HYSTERECTOMY     partial and then total  . ANKLE SURGERY     right  . ELBOW SURGERY     left  . JOINT REPLACEMENT Bilateral    knee  . REFRACTIVE SURGERY  2003  . REPLACEMENT TOTAL KNEE Bilateral   . TOTAL KNEE ARTHROPLASTY     bilateral  . VESICOVAGINAL FISTULA CLOSURE W/ TAH  1981    Current Medications: Current Outpatient Medications on File Prior to Visit  Medication Sig  . ALPRAZolam (XANAX) 0.5 MG tablet Take 1-2 tabs daily as needed (Patient taking differently: Take 0.5 mg by mouth at bedtime. )  . ARIPiprazole (ABILIFY) 2 MG tablet Take 1 tablet (2 mg total) by mouth daily. (Patient taking differently: Take 1 mg by mouth at bedtime. )  . blood glucose meter kit and supplies KIT Dispense based on patient and insurance preference. Use up to two times daily as directed. (Dx: type 2 DM - E11.9)  . Blood Glucose Monitoring Suppl DEVI Blood glucose meter One Touch Brand. Use to check BS up to BID. DX 250.02  . cholecalciferol (VITAMIN D) 1000 UNITS tablet Take 1,000 Units by mouth daily.   Marland Kitchen escitalopram (LEXAPRO) 20 MG tablet Take 1 tablet (20 mg total) by mouth daily.  Marland Kitchen esomeprazole (NEXIUM) 40 MG capsule  TAKE  (1)  CAPSULE  TWICE DAILY. (Patient taking differently: Take 40 mg by mouth daily. )  . ezetimibe (ZETIA) 10 MG tablet Take 1 tablet (10 mg total) by mouth daily. (Patient taking differently: Take 5 mg by mouth daily. )  . gabapentin (NEURONTIN) 600 MG tablet Take 1 tablet (600 mg total) by mouth 4 (four) times daily. (Patient taking differently: Take 600-1,200 mg by mouth See admin instructions. Take 600 mg by mouth in the morning, take 600 mg by mouth in the afternoon and take 1200 mg by mouth at bedtime)  . glucose blood (ONE TOUCH ULTRA TEST) test  strip CHECK BLOOD SUGAR UP TO 2 TIMES A DAY  . glucose monitoring kit (FREESTYLE) monitoring kit Please dispense whichever glucometer patient's insurance will cover.  Use to check BG up to twice.  DX: 250.02  . Lancets 30G MISC Use to check BG twice daily.  Dx:  250.02  . lisinopril-hydrochlorothiazide (PRINZIDE,ZESTORETIC) 20-25 MG tablet Take 1 tablet by mouth daily.  . metFORMIN (GLUCOPHAGE-XR) 500 MG 24 hr tablet Take 2 tablets (1,000 mg total) by mouth daily with breakfast.  . Omega-3 Fatty Acids (FISH OIL) 1000 MG CAPS Take 2,000 mg by mouth daily.   [name] DELICA LANCETS 35D MISC CHECK BLOOD SUGAR UP TO TWICE DAILY  . OZEMPIC, 1 MG/DOSE, 2 MG/1.5ML SOPN INJECT 1MG (0.75ML) ONCE A WEEK (Patient taking differently: Inject 1 mg into the skin every Sunday. )  . Pitavastatin Calcium (LIVALO) 2 MG TABS TAKE  (1)  TABLET  EVERY OTHER DAY. (Patient taking differently: Take 2 mg by mouth every Monday, Wednesday, and Friday. )  . PREMARIN vaginal cream Place 1 Applicatorful vaginally at bedtime as needed. (Patient taking differently: Place 1 Applicatorful vaginally at bedtime as needed (for dryness). )   No current facility-administered medications on file prior to visit.      Allergies:   Codeine; Crestor [rosuvastatin calcium]; Erythromycin; Fenofibrate; Lyrica [pregabalin]; Penicillins; Statins; Sulfa antibiotics; and Zocor [simvastatin]   Social History   Socioeconomic History  . Marital status: Married    Spouse name: Kasandra Knudsen  . Number of children: 2  . Years of education: Not on file  . Highest education level: Not on file  Occupational History  . Occupation: Retired  Scientific laboratory technician  . Financial resource strain: Not on file  . Food insecurity:    Worry: Not on file    Inability: Not on file  . Transportation needs:    Medical: Not on file    Non-medical: Not on file  Tobacco Use  . Smoking status: Former Smoker    Packs/day: 1.00    Years: 10.00    Pack years: 10.00     Types: Cigarettes    Start date: 11/07/1975    Last attempt to quit: 06/24/2010    Years since quitting: 7.6  . Smokeless tobacco: Never Used  Substance and Sexual Activity  . Alcohol use: No  . Drug use: No  . Sexual activity: Yes  Lifestyle  . Physical activity:    Days per week: Not on file    Minutes per session: Not on file  . Stress: Not on file  Relationships  . Social connections:    Talks on phone: Not on file    Gets together: Not on file    Attends religious service: Not on file    Active member of club or organization: Not on file    Attends meetings of clubs or organizations: Not on  file    Relationship status: Not on file  Other Topics Concern  . Not on file  Social History Narrative  . Not on file     Family History: The patient's family history includes Cirrhosis in her brother and father; Diabetes in her mother; Heart failure in her mother; Hypertension in her mother.  ROS:   Please see the history of present illness.  Additional pertinent ROS:  Constitutional: Negative for chills, fever, night sweats, unintentional weight loss  HENT: Negative for ear pain and hearing loss.   Eyes: Negative for loss of vision and eye pain.  Respiratory: Negative for cough, sputum, shortness of breath, wheezing.   Cardiovascular: Negative for chest pain, palpitations, PND, orthopnea, lower extremity edema and claudication.  Gastrointestinal: Negative for abdominal pain, melena, and hematochezia.  Genitourinary: Negative for dysuria and hematuria.  Musculoskeletal: Negative for falls and myalgias. Positive for joint pain. Skin: Negative for itching and rash.  Neurological: Negative for focal weakness, focal sensory changes and loss of consciousness.  Endo/Heme/Allergies: Does not bruise/bleed easily.    EKGs/Labs/Other Studies Reviewed:    The following studies were reviewed today: MPI 1998/06/03 MYOCARDIAL PERFUSION STUDY: THE MYOCARDIAL PERFUSION STUDY WAS PERFORMED WITH  TECHNETIUM-CARDIOLITE.   STRESS AND REST IMAGING WAS PERFORMED.   GATED SPECT IMAGES WERE OBTAINED TO EVALUATE WALL MOTION AND MYOCARDIAL THICKENING WITH CONTRACTION. EXAMINATION OF THE SPECT STRESS AND REST IMAGES DEMONSTRATES NO FIXED OR REVERSIBLE DEFECTS TO SUGGEST INFARCTION OR ISCHEMIA. EXAMINATION OF THE GATED SPECT IMAGES DEMONSTRATES NORMAL WALL MOTION AND MYOCARDIAL THICKENING WITH CONTRACTION.   THE ESTIMATED EJECTION FRACTION WAS 79 PERCENT. IMPRESSION 1.    NORMAL MYOCARDIAL PERFUSION STUDY. 2.    ESTIMATED EJECTION FRACTION OF 79 PERCENT  EKG:  EKG is personally reviewed.  The ekg ordered today demonstrates normal sinus rhythm  Recent Labs: 12/08/2017: ALT 23; BUN 9; Creatinine, Ser 0.77; Hemoglobin 13.3; Platelets 236; Potassium 4.6; Sodium 135  Recent Lipid Panel    Component Value Date/Time   CHOL 155 12/08/2017 0941   CHOL 223 (H) 10/21/2012 0827   TRIG 180 (H) 12/08/2017 0941   TRIG 158 (H) 09/03/2016 0817   TRIG 367 (H) 10/21/2012 0827   HDL 52 12/08/2017 0941   HDL 48 09/03/2016 0817   HDL 40 10/21/2012 0827   CHOLHDL 3.0 12/08/2017 0941   LDLCALC 67 12/08/2017 0941   LDLCALC 74 11/28/2013 0855   LDLCALC 110 (H) 10/21/2012 0827    Physical Exam:    VS:  BP 120/72   Pulse 93   Resp 16   Ht 5' 6"  (1.676 m)   Wt 200 lb (90.7 kg)   SpO2 96%   BMI 32.28 kg/m     Wt Readings from Last 3 Encounters:  02/25/18 200 lb (90.7 kg)  12/31/17 202 lb 6.4 oz (91.8 kg)  12/08/17 198 lb (89.8 kg)     GEN: Well nourished, well developed in no acute distress HEENT: Normal NECK: No JVD; No carotid bruits LYMPHATICS: No lymphadenopathy CARDIAC: regular rhythm, normal S1 and S2, no murmurs, rubs, gallops. Radial and DP pulses 2+ bilaterally. RESPIRATORY:  Clear to auscultation without rales, wheezing or rhonchi  ABDOMEN: Soft, non-tender, non-distended MUSCULOSKELETAL:  No edema; No deformity  SKIN: Warm and dry NEUROLOGIC:  Alert and oriented x 3 PSYCHIATRIC:   Normal affect   ASSESSMENT:    1. Preop cardiovascular exam   2. Aortic atherosclerosis ([place])   3. Counseling on health promotion and disease prevention   4. Class 1  obesity due to excess calories with serious comorbidity and body mass index (BMI) of 32.0 to 32.9 in adult    PLAN:    1. Preoperative cardiovascular evaluation: no history of valve issues, arrhythmia, or heart failure. No chest pain. Can achieve >4 METs activity without symptoms. According to ACC/AHA guidelines, no further cardiovascular testing needed.  The patient may proceed to surgery at acceptable risk.    2. Prevention -recommend heart healthy/Mediterranean diet, with whole grains, fruits, vegetable, fish, lean meats, nuts, and olive oil. Limit salt. -recommend moderate walking, 3-5 times/week for 30-50 minutes each session. Aim for at least 150 minutes.week. Goal should be pace of 3 miles/hours, or walking 1.5 miles in 30 minutes -recommend avoidance of tobacco products. Avoid excess alcohol. -Additional risk factor control:  -Diabetes: A1c is 6.8. On metformin and semaglutide. Could also consider SGLT2 inhibitor in the future.  -Lipids: has not tolerated rosuvastatin, simvastatin, or fenofibrate in the past. On pitavastatin 3x/week, tolerating. Given her risk factors, she will trial increasing to daily dosing to see if she tolerates. Her LDL was 67 on current dosing, goal is for long term plaque stabilization given aortic atherosclerosis seen on imaging. She is also tolerating ezetimibe.  -Blood pressure control: hypertension at goal on lisinopril-HCTZ  -Weight: class 1 obesity, wants to have knee replaced to increase activity. -ASCVD risk score: The 10-year ASCVD risk score Mikey Bussing DC Brooke Bonito., et al., 2013) is: 23.8%   Values used to calculate the score:     Age: 20 years     Sex: Female     Is Non-Hispanic African American: No     Diabetic: Yes     Tobacco smoker: No     Systolic Blood Pressure: 500 mmHg     Is BP  treated: Yes     HDL Cholesterol: 52 mg/dL     Total Cholesterol: 155 mg/dL  Plan for follow up: 1 year or sooner PRN  Medication Adjustments/Labs and Tests Ordered: Current medicines are reviewed at length with the patient today.  Concerns regarding medicines are outlined above.  Orders Placed This Encounter  Procedures  . EKG 12-Lead   No orders of the defined types were placed in this encounter.   Patient Instructions  Medication Instructions:  Your Physician recommend you continue on your current medication as directed.    If you need a refill on your cardiac medications before your next appointment, please call your pharmacy.   Lab work: None  Testing/Procedures: None  Follow-Up: At [facility], you and your health needs are our priority.  As part of our continuing mission to provide you with exceptional heart care, we have created designated Provider Care Teams.  These Care Teams include your primary Cardiologist (physician) and Advanced Practice Providers (APPs -  Physician Assistants and Nurse Practitioners) who all work together to provide you with the care you need, when you need it. You will need a follow up appointment in 1 years.  Please call our office 2 months in advance to schedule this appointment.  You may see Dr. Harrell Gave or one of the following Advanced Practice Providers on your designated Care Team:   Rosaria Ferries, PA-C . Jory Sims, DNP, ANP  Any Other Special Instructions Will Be Listed Below (If Applicable).       Signed, Buford Dresser, MD PhD 02/25/2018 1:31 PM    [facility]

## 2018-02-25 NOTE — Patient Instructions (Signed)
Medication Instructions:  Your Physician recommend you continue on your current medication as directed.    If you need a refill on your cardiac medications before your next appointment, please call your pharmacy.   Lab work: None  Testing/Procedures: None  Follow-Up: At [facility], you and your health needs are our priority.  As part of our continuing mission to provide you with exceptional heart care, we have created designated Provider Care Teams.  These Care Teams include your primary Cardiologist (physician) and Advanced Practice Providers (APPs -  Physician Assistants and Nurse Practitioners) who all work together to provide you with the care you need, when you need it. You will need a follow up appointment in 1 years.  Please call our office 2 months in advance to schedule this appointment.  You may see Dr. Harrell Gave or one of the following Advanced Practice Providers on your designated Care Team:   Rosaria Ferries, PA-C . Jory Sims, DNP, ANP  Any Other Special Instructions Will Be Listed Below (If Applicable).

## 2018-02-28 NOTE — Progress Notes (Signed)
02/25/2018- noted in [place]  12/08/2017- noted in Epic- CXR and Labs-HgA1C, Hepatic function, Vitamin D 25, Lipid, CBC w/diff., BMP

## 2018-02-28 NOTE — Patient Instructions (Addendum)
[name]  02/28/2018   Your procedure is scheduled on:Wednesday 03/09/2018  Report to [facility] [address] [facility]  Entrance              Report to admitting at  1100  AM    Call this number if you have problems the morning of surgery [phone]   How to Manage Your Diabetes Before and After Surgery  Why is it important to control my blood sugar before and after surgery? . Improving blood sugar levels before and after surgery helps healing and can limit problems. . A way of improving blood sugar control is eating a healthy diet by: o  Eating less sugar and carbohydrates o  Increasing activity/exercise o  Talking with your doctor about reaching your blood sugar goals . High blood sugars (greater than 180 mg/dL) can raise your risk of infections and slow your recovery, so you will need to focus on controlling your diabetes during the weeks before surgery. . Make sure that the doctor who takes care of your diabetes knows about your planned surgery including the date and location.  How do I manage my blood sugar before surgery? . Check your blood sugar at least 4 times a day, starting 2 days before surgery, to make sure that the level is not too high or low. o Check your blood sugar the morning of your surgery when you wake up and every 2 hours until you get to the Short Stay unit. . If your blood sugar is less than 70 mg/dL, you will need to treat for low blood sugar: o Do not take insulin. o Treat a low blood sugar (less than 70 mg/dL) with  cup of clear juice (cranberry or apple), 4 glucose tablets, OR glucose gel. o Recheck blood sugar in 15 minutes after treatment (to make sure it is greater than 70 mg/dL). If your blood sugar is not greater than 70 mg/dL on recheck, call [phone] for further instructions. . Report your blood sugar to the short stay nurse when you get to Short Stay.  . If you are admitted to the hospital after surgery: o Your blood sugar  will be checked by the staff and you will probably be given insulin after surgery (instead of oral diabetes medicines) to make sure you have good blood sugar levels. o The goal for blood sugar control after surgery is 80-180 mg/dL.   WHAT DO I DO ABOUT MY DIABETES MEDICATION?        The day before surgery, Take Metformin (Glucophage-XR) as usual  . Do not take oral diabetes medicines (pills) the morning of surgery.    Remember: Do not eat food  :After Midnight.  May have clear liquids from midnight up until 0730 am then nothing until after surgery!    CLEAR LIQUID DIET   Foods Allowed                                                                     Foods Excluded  Coffee and tea, regular and decaf  liquids that you cannot  Plain Jell-O in any flavor                                             see through such as: Fruit ices (not with fruit pulp)                                     milk, soups, orange juice  Iced Popsicles                                    All solid food Carbonated beverages, regular and diet                                    Cranberry, grape and apple juices Sports drinks like Gatorade Lightly seasoned clear broth or consume(fat free) Sugar, honey syrup  Sample Menu Breakfast                                Lunch                                     Supper Cranberry juice                    Beef broth                            Chicken broth Jell-O                                     Grape juice                           Apple juice Coffee or tea                        Jell-O                                      Popsicle                                                Coffee or tea                        Coffee or tea  _____________________________________________________________________               BRUSH YOUR TEETH MORNING OF SURGERY AND RINSE YOUR MOUTH OUT, NO CHEWING GUM CANDY OR MINTS.     Take these medicines the morning  of surgery with A SIP OF WATER: Escitalopram (Lexapro), Gabapentin (Neurontin), Esomeprazole (Nexium)  DO NOT TAKE ANY DIABETIC MEDICATIONS DAY OF YOUR SURGERY!                               You may not have any metal on your body including hair pins and              piercings  Do not wear jewelry, make-up, lotions, powders or perfumes, deodorant             Do not wear nail polish.  Do not shave  48 hours prior to surgery.          Do not bring valuables to the hospital. [place].  Contacts, dentures or bridgework may not be worn into surgery.  Leave suitcase in the car. After surgery it may be brought to your room.                  Please read over the following fact sheets you were given: _____________________________________________________________________             [facility] - Preparing for Surgery Before surgery, you can play an important role.  Because skin is not sterile, your skin needs to be as free of germs as possible.  You can reduce the number of germs on your skin by washing with CHG (chlorahexidine gluconate) soap before surgery.  CHG is an antiseptic cleaner which kills germs and bonds with the skin to continue killing germs even after washing. Please DO NOT use if you have an allergy to CHG or antibacterial soaps.  If your skin becomes reddened/irritated stop using the CHG and inform your nurse when you arrive at Short Stay. Do not shave (including legs and underarms) for at least 48 hours prior to the first CHG shower.  You may shave your face/neck. Please follow these instructions carefully:  1.  Shower with CHG Soap the night before surgery and the  morning of Surgery.  2.  If you choose to wash your hair, wash your hair first as usual with your  normal  shampoo.  3.  After you shampoo, rinse your hair and body thoroughly to remove the  shampoo.                           4.  Use CHG as you would any  other liquid soap.  You can apply chg directly  to the skin and wash                       Gently with a scrungie or clean washcloth.  5.  Apply the CHG Soap to your body ONLY FROM THE NECK DOWN.   Do not use on face/ open                           Wound or open sores. Avoid contact with eyes, ears mouth and genitals (private parts).                       Wash face,  Genitals (private parts) with your normal soap.             6.  Wash thoroughly, paying special attention to the area where your surgery  will  be performed.  7.  Thoroughly rinse your body with warm water from the neck down.  8.  DO NOT shower/wash with your normal soap after using and rinsing off  the CHG Soap.                9.  Pat yourself dry with a clean towel.            10.  Wear clean pajamas.            11.  Place clean sheets on your bed the night of your first shower and do not  sleep with pets. Day of Surgery : Do not apply any lotions/deodorants the morning of surgery.  Please wear clean clothes to the hospital/surgery center.  FAILURE TO FOLLOW THESE INSTRUCTIONS MAY RESULT IN THE CANCELLATION OF YOUR SURGERY PATIENT SIGNATURE_________________________________  NURSE SIGNATURE__________________________________  ________________________________________________________________________   Adam Phenix  An incentive spirometer is a tool that can help keep your lungs clear and active. This tool measures how well you are filling your lungs with each breath. Taking long deep breaths may help reverse or decrease the chance of developing breathing (pulmonary) problems (especially infection) following:  A long period of time when you are unable to move or be active. BEFORE THE PROCEDURE   If the spirometer includes an indicator to show your best effort, your nurse or respiratory therapist will set it to a desired goal.  If possible, sit up straight or lean slightly forward. Try not to slouch.  Hold the  incentive spirometer in an upright position. INSTRUCTIONS FOR USE  1. Sit on the edge of your bed if possible, or sit up as far as you can in bed or on a chair. 2. Hold the incentive spirometer in an upright position. 3. Breathe out normally. 4. Place the mouthpiece in your mouth and seal your lips tightly around it. 5. Breathe in slowly and as deeply as possible, raising the piston or the ball toward the top of the column. 6. Hold your breath for 3-5 seconds or for as long as possible. Allow the piston or ball to fall to the bottom of the column. 7. Remove the mouthpiece from your mouth and breathe out normally. 8. Rest for a few seconds and repeat Steps 1 through 7 at least 10 times every 1-2 hours when you are awake. Take your time and take a few normal breaths between deep breaths. 9. The spirometer may include an indicator to show your best effort. Use the indicator as a goal to work toward during each repetition. 10. After each set of 10 deep breaths, practice coughing to be sure your lungs are clear. If you have an incision (the cut made at the time of surgery), support your incision when coughing by placing a pillow or rolled up towels firmly against it. Once you are able to get out of bed, walk around indoors and cough well. You may stop using the incentive spirometer when instructed by your caregiver.  RISKS AND COMPLICATIONS  Take your time so you do not get dizzy or light-headed.  If you are in pain, you may need to take or ask for pain medication before doing incentive spirometry. It is harder to take a deep breath if you are having pain. AFTER USE  Rest and breathe slowly and easily.  It can be helpful to keep track of a log of your progress. Your caregiver can provide you with a simple table to help with this. If  you are using the spirometer at home, follow these instructions: [place] IF:   You are having difficultly using the spirometer.  You have trouble using  the spirometer as often as instructed.  Your pain medication is not giving enough relief while using the spirometer.  You develop fever of 100.5 F (38.1 C) or higher. SEEK IMMEDIATE MEDICAL CARE IF:   You cough up bloody sputum that had not been present before.  You develop fever of 102 F (38.9 C) or greater.  You develop worsening pain at or near the incision site. MAKE SURE YOU:   Understand these instructions.  Will watch your condition.  Will get help right away if you are not doing well or get worse. Document Released: 08/03/2006 Document Revised: 06/15/2011 Document Reviewed: 10/04/2006 [employer] Patient Information 2014 [employer], [place].   ________________________________________________________________________  WHAT IS A BLOOD TRANSFUSION? Blood Transfusion Information  A transfusion is the replacement of blood or some of its parts. Blood is made up of multiple cells which provide different functions.  Red blood cells carry oxygen and are used for blood loss replacement.  White blood cells fight against infection.  Platelets control bleeding.  Plasma helps clot blood.  Other blood products are available for specialized needs, such as hemophilia or other clotting disorders. BEFORE THE TRANSFUSION  Who gives blood for transfusions?   Healthy volunteers who are fully evaluated to make sure their blood is safe. This is blood bank blood. Transfusion therapy is the safest it has ever been in the practice of medicine. Before blood is taken from a donor, a complete history is taken to make sure that person has no history of diseases nor engages in risky social behavior (examples are intravenous drug use or sexual activity with multiple partners). The donor's travel history is screened to minimize risk of transmitting infections, such as malaria. The donated blood is tested for signs of infectious diseases, such as HIV and hepatitis. The blood is then tested to be sure it  is compatible with you in order to minimize the chance of a transfusion reaction. If you or a relative donates blood, this is often done in anticipation of surgery and is not appropriate for emergency situations. It takes many days to process the donated blood. RISKS AND COMPLICATIONS Although transfusion therapy is very safe and saves many lives, the main dangers of transfusion include:   Getting an infectious disease.  Developing a transfusion reaction. This is an allergic reaction to something in the blood you were given. Every precaution is taken to prevent this. The decision to have a blood transfusion has been considered carefully by your caregiver before blood is given. Blood is not given unless the benefits outweigh the risks. AFTER THE TRANSFUSION  Right after receiving a blood transfusion, you will usually feel much better and more energetic. This is especially true if your red blood cells have gotten low (anemic). The transfusion raises the level of the red blood cells which carry oxygen, and this usually causes an energy increase.  The nurse administering the transfusion will monitor you carefully for complications. HOME CARE INSTRUCTIONS  No special instructions are needed after a transfusion. You may find your energy is better. Speak with your caregiver about any limitations on activity for underlying diseases you may have. SEEK MEDICAL CARE IF:   Your condition is not improving after your transfusion.  You develop redness or irritation at the intravenous (IV) site. SEEK IMMEDIATE MEDICAL CARE IF:  Any of the  following symptoms occur over the next 12 hours:  Shaking chills.  You have a temperature by mouth above 102 F (38.9 C), not controlled by medicine.  Chest, back, or muscle pain.  People around you feel you are not acting correctly or are confused.  Shortness of breath or difficulty breathing.  Dizziness and fainting.  You get a rash or develop hives.  You  have a decrease in urine output.  Your urine turns a dark color or changes to pink, red, or brown. Any of the following symptoms occur over the next 10 days:  You have a temperature by mouth above 102 F (38.9 C), not controlled by medicine.  Shortness of breath.  Weakness after normal activity.  The white part of the eye turns yellow (jaundice).  You have a decrease in the amount of urine or are urinating less often.  Your urine turns a dark color or changes to pink, red, or brown. Document Released: 03/20/2000 Document Revised: 06/15/2011 Document Reviewed: 11/07/2007 [facility] Patient Information 2014 [place], [place].  _______________________________________________________________________

## 2018-03-01 ENCOUNTER — Encounter (HOSPITAL_COMMUNITY)
Admission: RE | Admit: 2018-03-01 | Discharge: 2018-03-01 | Disposition: A | Discharge status: Home / Self Care | Payer: Medicare Other | Source: Ambulatory Visit | Attending: Orthopedic Surgery | Admitting: Orthopedic Surgery

## 2018-03-01 ENCOUNTER — Other Ambulatory Visit: Payer: Self-pay

## 2018-03-01 ENCOUNTER — Encounter (HOSPITAL_COMMUNITY): Payer: Self-pay

## 2018-03-01 DIAGNOSIS — Z01812 Encounter for preprocedural laboratory examination: Secondary | ICD-10-CM | POA: Diagnosis not present

## 2018-03-01 LAB — COMPREHENSIVE METABOLIC PANEL
ALBUMIN: 3.4 g/dL — AB (ref 3.5–5.0)
ALT: 22 U/L (ref 0–44)
AST: 26 U/L (ref 15–41)
Alkaline Phosphatase: 69 U/L (ref 38–126)
Anion gap: 9 (ref 5–15)
BUN: 13 mg/dL (ref 8–23)
CHLORIDE: 94 mmol/L — AB (ref 98–111)
CO2: 27 mmol/L (ref 22–32)
CREATININE: 0.77 mg/dL (ref 0.44–1.00)
Calcium: 9 mg/dL (ref 8.9–10.3)
GFR calc Af Amer: >60 mL/min (ref >60)
GFR calc non Af Amer: >60 mL/min (ref >60)
Glucose, Bld: 105 mg/dL — ABNORMAL HIGH (ref 70–99)
POTASSIUM: 4.1 mmol/L (ref 3.5–5.1)
SODIUM: 130 mmol/L — AB (ref 135–145)
Total Bilirubin: 0.9 mg/dL (ref 0.3–1.2)
Total Protein: 6.8 g/dL (ref 6.5–8.1)

## 2018-03-01 LAB — CBC
HCT: 37 % (ref 36.0–46.0)
Hemoglobin: 11.8 g/dL — ABNORMAL LOW (ref 12.0–15.0)
MCH: 28.9 pg (ref 26.0–34.0)
MCHC: 31.9 g/dL (ref 30.0–36.0)
MCV: 90.7 fL (ref 80.0–100.0)
NRBC: 0 % (ref 0.0–0.2)
PLATELETS: 310 10*3/uL (ref 150–400)
RBC: 4.08 MIL/uL (ref 3.87–5.11)
RDW: 12.7 % (ref 11.5–15.5)
WBC: 9.9 10*3/uL (ref 4.0–10.5)

## 2018-03-01 LAB — SURGICAL PCR SCREEN
MRSA, PCR: NEGATIVE
Staphylococcus aureus: NEGATIVE

## 2018-03-01 LAB — GLUCOSE, CAPILLARY: Glucose-Capillary: 113 mg/dL — ABNORMAL HIGH (ref 70–99)

## 2018-03-01 LAB — PROTIME-INR
INR: 0.9
Prothrombin Time: 12 seconds (ref 11.4–15.2)

## 2018-03-01 LAB — APTT: APTT: 26 s (ref 24–36)

## 2018-03-01 LAB — HEMOGLOBIN A1C
Hgb A1c MFr Bld: 6.9 % — ABNORMAL HIGH (ref 4.8–5.6)
Mean Plasma Glucose: 151.33 mg/dL

## 2018-03-08 MED ORDER — BUPIVACAINE LIPOSOME 1.3 % IJ SUSP
20.0000 mL | Freq: Once | INTRAMUSCULAR | Status: DC
  Filled 2018-03-08: qty 20

## 2018-03-08 MED ORDER — TRANEXAMIC ACID 1000 MG/10ML IV SOLN
2000.0000 mg | INTRAVENOUS | Status: DC
  Filled 2018-03-08: qty 20

## 2018-03-08 NOTE — H&P (Signed)
TOTAL KNEE REVISION ADMISSION H&P  Patient is being admitted for right revision total knee arthroplasty.  Subjective:  Chief Complaint:right knee pain.  HPI: [name], 72 y.o. female, has a history of pain and functional disability in the right knee(s) due to failed previous arthroplasty and patient has failed non-surgical conservative treatments for greater than 12 weeks to include activity modification and use of a knee brace. The indications for the revision of the total knee arthroplasty are loosening of one or more components. Onset of symptoms was abrupt starting 6 months ago with stable course since that time.  Prior procedures on the right knee(s) include arthroplasty.  Patient currently rates pain in the right knee(s) at 6 out of 10 with activity. There is worsening of pain with activity and weight bearing, pain that interferes with activities of daily living and instability.  Patient has evidence of prosthetic loosening by imaging studies. This condition presents safety issues increasing the risk of falls. Bone scan of the right knee shows increased uptake around the tibial component. There is no current active infection.  Patient Active Problem List   Diagnosis Date Noted  . Aortic atherosclerosis ([place]) 12/08/2017  . 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 Depression 02/28/2013  . GERD (gastroesophageal reflux disease) 02/28/2013  . Hiatal hernia with gastroesophageal reflux 02/28/2013  . Peripheral neuropathy 02/28/2013  . DDD (degenerative disc disease), lumbosacral 06/23/2012  . Fibromyalgia syndrome 06/23/2012  . Essential hypertension, benign 06/23/2012  . Osteopenia 06/23/2012  . Diabetes ([place]) 06/23/2012  . Abnormal EKG 10/29/2010  . Obesity due to excess calories 10/29/2010  . 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 DYSPHAGIA 10/11/2009  . PERSONAL HISTORY OF COLONIC POLYPS 10/11/2009   Past Medical History:  Diagnosis Date  . Arthritis    [place]   . Colon polyps   . Depression   . Diabetes mellitus, type 2 ([place]) 06/2010  . Diverticulosis   . Esophageal stricture   . Fibromyalgia    [place]   . GERD (gastroesophageal reflux disease)   . Hemorrhoids   . Hiatal hernia   . Hyperlipidemia   . Hypertension   . Menopause   . Peripheral vascular insufficiency ([place])   . Vitamin D deficiency     Past Surgical History:  Procedure Laterality Date  . ABDOMINAL HYSTERECTOMY     partial and then total  . ANKLE SURGERY     right  . ELBOW SURGERY     left  . JOINT REPLACEMENT Bilateral    knee right (15 years ago), left knee (10 years ago)  . REFRACTIVE SURGERY  2003  . REPLACEMENT TOTAL KNEE Bilateral   . TOTAL KNEE ARTHROPLASTY     bilateral  . VESICOVAGINAL FISTULA CLOSURE W/ TAH  1981    Current Facility-Administered Medications  Medication Dose Route Frequency Provider Last Rate Last Dose  . [START ON 03/09/2018] bupivacaine liposome (EXPAREL) 1.3 % injection 266 mg  20 mL Other Once Gaynelle Arabian, MD      . Derrill Memo ON 03/09/2018] tranexamic acid (CYKLOKAPRON) 2,000 mg in sodium chloride 0.9 % 50 mL Topical Application  3,086 mg Topical To OR Gaynelle Arabian, MD       Current Outpatient Medications  Medication Sig Dispense Refill Last Dose  . ALPRAZolam (XANAX) 0.5 MG tablet Take 1-2 tabs daily as needed (Patient taking differently: Take 0.5 mg by mouth at bedtime. ) 135 tablet 1 Taking  . ARIPiprazole (ABILIFY) 2 MG tablet Take 1 tablet (2 mg total) by  mouth daily. (Patient taking differently: Take 1 mg by mouth at bedtime. ) 90 tablet 3 Taking  . cholecalciferol (VITAMIN D) 1000 UNITS tablet Take 1,000 Units by mouth daily.    Taking  . escitalopram (LEXAPRO) 20 MG tablet Take 1 tablet (20 mg total) by mouth daily. 90 tablet 3 Taking  . esomeprazole (NEXIUM) 40 MG capsule TAKE  (1)  CAPSULE  TWICE DAILY. (Patient taking differently: Take 40 mg by mouth daily. ) 180 capsule 3 Taking  . ezetimibe (ZETIA) 10 MG  tablet Take 1 tablet (10 mg total) by mouth daily. (Patient taking differently: Take 5 mg by mouth daily. ) 90 tablet 3 Taking  . gabapentin (NEURONTIN) 600 MG tablet Take 1 tablet (600 mg total) by mouth 4 (four) times daily. (Patient taking differently: Take 600-1,200 mg by mouth See admin instructions. Take 600 mg by mouth in the morning, take 600 mg by mouth in the afternoon and take 1200 mg by mouth at bedtime) 360 tablet 3 Taking  . lisinopril-hydrochlorothiazide (PRINZIDE,ZESTORETIC) 20-25 MG tablet Take 1 tablet by mouth daily. 90 tablet 3 Taking  . metFORMIN (GLUCOPHAGE-XR) 500 MG 24 hr tablet Take 2 tablets (1,000 mg total) by mouth daily with breakfast. 180 tablet 3 Taking  . Omega-3 Fatty Acids (FISH OIL) 1000 MG CAPS Take 2,000 mg by mouth daily.    Taking  . OZEMPIC, 1 MG/DOSE, 2 MG/1.5ML SOPN INJECT 1MG (0.75ML) ONCE A WEEK (Patient taking differently: Inject 1 mg into the skin every Sunday. ) 9 mL 0 Taking  . Pitavastatin Calcium (LIVALO) 2 MG TABS TAKE  (1)  TABLET  EVERY OTHER DAY. (Patient taking differently: Take 2 mg by mouth every Monday, Wednesday, and Friday. ) 45 tablet 3 Taking  . PREMARIN vaginal cream Place 1 Applicatorful vaginally at bedtime as needed. (Patient taking differently: Place 1 Applicatorful vaginally at bedtime as needed (for dryness). ) 30 g 5 Taking  . blood glucose meter kit and supplies KIT Dispense based on patient and insurance preference. Use up to two times daily as directed. (Dx: type 2 DM - E11.9) 1 each 0 Taking  . Blood Glucose Monitoring Suppl DEVI Blood glucose meter One Touch Brand. Use to check BS up to BID. DX 250.02 1 each 0 Taking  . glucose blood (ONE TOUCH ULTRA TEST) test strip CHECK BLOOD SUGAR UP TO 2 TIMES A DAY 100 each 2 Taking  . glucose monitoring kit (FREESTYLE) monitoring kit Please dispense whichever glucometer patient's insurance will cover.  Use to check BG up to twice.  DX: 250.02 1 each 0 Taking  . Lancets 30G MISC Use to  check BG twice daily.  Dx:  250.02 200 each 2 Taking  . ONETOUCH DELICA LANCETS 88F MISC CHECK BLOOD SUGAR UP TO TWICE DAILY 100 each 2 Taking   Allergies  Allergen Reactions  . Codeine Nausea And Vomiting  . Crestor [Rosuvastatin Calcium] Other (See Comments)    Joints ache  . Erythromycin Nausea And Vomiting  . Fenofibrate Other (See Comments)    aching & edema    . Lyrica [Pregabalin] Other (See Comments)    Edema in feet   . Penicillins Other (See Comments)    Blisters in mouth  . Statins Other (See Comments)    Joints ache  . Sulfa Antibiotics Nausea And Vomiting  . Zocor [Simvastatin] Other (See Comments)    Joints ache    Social History   Tobacco Use  . Smoking status: Former Smoker  Packs/day: 1.00    Years: 10.00    Pack years: 10.00    Types: Cigarettes    Start date: 11/07/1975    Last attempt to quit: 06/24/2010    Years since quitting: 7.7  . Smokeless tobacco: Never Used  Substance Use Topics  . Alcohol use: No    Family History  Problem Relation Age of Onset  . Diabetes Mother   . Heart failure Mother   . Hypertension Mother   . Cirrhosis Father   . Cirrhosis Brother       Review of Systems  Constitutional: Negative for chills and fever.  HENT: Negative for congestion, sore throat and tinnitus.   Eyes: Negative for double vision, photophobia and pain.  Respiratory: Negative for cough, shortness of breath and wheezing.   Cardiovascular: Negative for chest pain, palpitations and orthopnea.  Gastrointestinal: Negative for heartburn, nausea and vomiting.  Genitourinary: Negative for dysuria, frequency and urgency.  Musculoskeletal: Positive for joint pain.  Neurological: Negative for dizziness, weakness and headaches.     Objective:  Physical Exam  Well nourished and well developed. General: Alert and oriented x3, cooperative and pleasant, no acute distress. Head: normocephalic, atraumatic, neck supple. Eyes: EOMI. Respiratory: breath sounds  clear in all fields, no wheezing, rales, or rhonchi. Cardiovascular: Regular rate and rhythm, no murmurs, gallops or rubs.  Abdomen: non-tender to palpation and soft, normoactive bowel sounds. Musculoskeletal: Right Knee Exam: Slight effusion. Range of motion is 0-115 degrees. No crepitus on range of motion of the knee. No medial joint line tenderness.No lateral joint line tenderness. Slight varus-valgus laxity in extension, but no gross instability. Calves soft and nontender. Motor function intact in LE. Strength 5/5 LE bilaterally. Neuro: Distal pulses 2+. Sensation to light touch intact in LE.  Vital signs in last 24 hours:  Blood pressure: 116/68 mmHg Pulse: 80 bpm  Labs:  Estimated body mass index is 32.28 kg/m as calculated from the following:   Height as of 03/01/18: 5' 6"  (1.676 m).   Weight as of 03/01/18: 90.7 kg.  Imaging Review Radiographs- AP and lateral of the bilateral knees dated 11/04/2017 demonstrate the right knee prosthesis in excellent alignment. There is questionable radiolucency under the tibial tray on the AP view. It looks normal on the lateral view. There is no migration of the component. The left knee prosthesis looks normal.  Bone scan of the right knee shows increased uptake around the tibial component.  Preoperative templating of the joint replacement has been completed, documented, and submitted to the Operating Room personnel in order to optimize intra-operative equipment management.   Assessment/Plan:  End stage arthritis, right knee(s) with failed previous arthroplasty.   The patient history, physical examination, clinical judgment of the provider and imaging studies are consistent with end stage degenerative joint disease of the right knee(s), previous total knee arthroplasty. Revision total knee arthroplasty is deemed medically necessary. The treatment options including medical management, injection therapy, arthroscopy and revision arthroplasty were  discussed at length. The risks and benefits of revision total knee arthroplasty were presented and reviewed. The risks due to aseptic loosening, infection, stiffness, patella tracking problems, thromboembolic complications and other imponderables were discussed. The patient acknowledged the explanation, agreed to proceed with the plan and consent was signed. Patient is being admitted for inpatient treatment for surgery, pain control, PT, OT, prophylactic antibiotics, VTE prophylaxis, progressive ambulation and ADL's and discharge planning.The patient is planning to be discharged home with outpatient physical therapy.   Therapy Plans: outpatient therapy at  [facility] Disposition: Home with husband Planned DVT Prophylaxis: Aspirin 325 mg BID DME needed: None PCP: Dr. Morrie Sheldon Cardiologist: Dr. Harrell Gave TXA: IV Allergies: PCN (hives) Anesthesia Concerns: None Last HgbA1c: 6.8% Other: Pt has appointment with Dr. Harrell Gave on 11/22 for cardiac clearance Post-operative pain management will be complicated by chronic opioid use.  - Patient was instructed on what medications to stop prior to surgery. - Follow-up visit in 2 weeks with Dr. Wynelle Link - Begin physical therapy following surgery - Pre-operative lab work as pre-surgical testing - Prescriptions will be provided in hospital at time of discharge  Theresa Duty, PA-C Orthopedic Surgery [facility] [place]

## 2018-03-09 ENCOUNTER — Inpatient Hospital Stay (HOSPITAL_COMMUNITY): Payer: Medicare Other | Admitting: Certified Registered Nurse Anesthetist

## 2018-03-09 ENCOUNTER — Encounter (HOSPITAL_COMMUNITY): Payer: Self-pay | Admitting: General Practice

## 2018-03-09 ENCOUNTER — Encounter (HOSPITAL_COMMUNITY)
Admission: RE | Disposition: A | Discharge status: Home / Self Care | Payer: Self-pay | Source: Ambulatory Visit | Attending: Orthopedic Surgery

## 2018-03-09 ENCOUNTER — Other Ambulatory Visit: Payer: Self-pay

## 2018-03-09 ENCOUNTER — Inpatient Hospital Stay (HOSPITAL_COMMUNITY)
Admission: RE | Admit: 2018-03-09 | Discharge: 2018-03-11 | DRG: 468 | Disposition: A | Discharge status: Home / Self Care | Payer: Medicare Other | Source: Ambulatory Visit | Attending: Orthopedic Surgery | Admitting: Orthopedic Surgery

## 2018-03-09 DIAGNOSIS — Z79890 Hormone replacement therapy (postmenopausal): Secondary | ICD-10-CM

## 2018-03-09 DIAGNOSIS — T84032A Mechanical loosening of internal right knee prosthetic joint, initial encounter: Principal | ICD-10-CM | POA: Diagnosis present

## 2018-03-09 DIAGNOSIS — Z882 Allergy status to sulfonamides status: Secondary | ICD-10-CM | POA: Diagnosis not present

## 2018-03-09 DIAGNOSIS — Y792 Prosthetic and other implants, materials and accessory orthopedic devices associated with adverse incidents: Secondary | ICD-10-CM | POA: Diagnosis present

## 2018-03-09 DIAGNOSIS — Z96659 Presence of unspecified artificial knee joint: Secondary | ICD-10-CM

## 2018-03-09 DIAGNOSIS — Z888 Allergy status to other drugs, medicaments and biological substances status: Secondary | ICD-10-CM

## 2018-03-09 DIAGNOSIS — E669 Obesity, unspecified: Secondary | ICD-10-CM | POA: Diagnosis present

## 2018-03-09 DIAGNOSIS — E559 Vitamin D deficiency, unspecified: Secondary | ICD-10-CM | POA: Diagnosis present

## 2018-03-09 DIAGNOSIS — E785 Hyperlipidemia, unspecified: Secondary | ICD-10-CM | POA: Diagnosis present

## 2018-03-09 DIAGNOSIS — Z880 Allergy status to penicillin: Secondary | ICD-10-CM | POA: Diagnosis not present

## 2018-03-09 DIAGNOSIS — M797 Fibromyalgia: Secondary | ICD-10-CM | POA: Diagnosis present

## 2018-03-09 DIAGNOSIS — Z6832 Body mass index (BMI) 32.0-32.9, adult: Secondary | ICD-10-CM | POA: Diagnosis not present

## 2018-03-09 DIAGNOSIS — I10 Essential (primary) hypertension: Secondary | ICD-10-CM | POA: Diagnosis present

## 2018-03-09 DIAGNOSIS — G8918 Other acute postprocedural pain: Secondary | ICD-10-CM | POA: Diagnosis not present

## 2018-03-09 DIAGNOSIS — F329 Major depressive disorder, single episode, unspecified: Secondary | ICD-10-CM | POA: Diagnosis present

## 2018-03-09 DIAGNOSIS — Z881 Allergy status to other antibiotic agents status: Secondary | ICD-10-CM | POA: Diagnosis not present

## 2018-03-09 DIAGNOSIS — Z87891 Personal history of nicotine dependence: Secondary | ICD-10-CM | POA: Diagnosis not present

## 2018-03-09 DIAGNOSIS — Z79899 Other long term (current) drug therapy: Secondary | ICD-10-CM | POA: Diagnosis not present

## 2018-03-09 DIAGNOSIS — Z7984 Long term (current) use of oral hypoglycemic drugs: Secondary | ICD-10-CM

## 2018-03-09 DIAGNOSIS — Z885 Allergy status to narcotic agent status: Secondary | ICD-10-CM

## 2018-03-09 DIAGNOSIS — Z86010 Personal history of colonic polyps: Secondary | ICD-10-CM | POA: Diagnosis not present

## 2018-03-09 DIAGNOSIS — K219 Gastro-esophageal reflux disease without esophagitis: Secondary | ICD-10-CM | POA: Diagnosis not present

## 2018-03-09 DIAGNOSIS — M1711 Unilateral primary osteoarthritis, right knee: Secondary | ICD-10-CM | POA: Diagnosis present

## 2018-03-09 DIAGNOSIS — E1151 Type 2 diabetes mellitus with diabetic peripheral angiopathy without gangrene: Secondary | ICD-10-CM | POA: Diagnosis present

## 2018-03-09 DIAGNOSIS — Z96652 Presence of left artificial knee joint: Secondary | ICD-10-CM | POA: Diagnosis not present

## 2018-03-09 DIAGNOSIS — T84018A Broken internal joint prosthesis, other site, initial encounter: Secondary | ICD-10-CM

## 2018-03-09 HISTORY — PX: TOTAL KNEE REVISION: SHX996

## 2018-03-09 LAB — TYPE AND SCREEN
ABO/RH(D): A POS
ANTIBODY SCREEN: NEGATIVE

## 2018-03-09 LAB — GLUCOSE, CAPILLARY
GLUCOSE-CAPILLARY: 89 mg/dL (ref 70–99)
Glucose-Capillary: 108 mg/dL — ABNORMAL HIGH (ref 70–99)
Glucose-Capillary: 214 mg/dL — ABNORMAL HIGH (ref 70–99)

## 2018-03-09 SURGERY — TOTAL KNEE REVISION
Anesthesia: Spinal | Site: Knee | Laterality: Right

## 2018-03-09 MED ORDER — GABAPENTIN 400 MG PO CAPS
1200.0000 mg | ORAL_CAPSULE | Freq: Every day | ORAL | Status: DC
  Administered 2018-03-09 – 2018-03-10 (×2): 1200 mg via ORAL
  Filled 2018-03-09 (×2): qty 3

## 2018-03-09 MED ORDER — PROPOFOL 10 MG/ML IV BOLUS
INTRAVENOUS | Status: DC | PRN
  Administered 2018-03-09: 10 mg via INTRAVENOUS

## 2018-03-09 MED ORDER — GABAPENTIN 600 MG PO TABS: 600.0000 mg | ORAL_TABLET | ORAL | Status: DC

## 2018-03-09 MED ORDER — ESTROGENS, CONJUGATED 0.625 MG/GM VA CREA: 1.0000 | TOPICAL_CREAM | Freq: Every evening | VAGINAL | Status: DC | PRN

## 2018-03-09 MED ORDER — TRANEXAMIC ACID 1000 MG/10ML IV SOLN
INTRAVENOUS | Status: DC | PRN
  Administered 2018-03-09: 2000 mg via TOPICAL

## 2018-03-09 MED ORDER — OXYCODONE HCL 5 MG PO TABS
5.0000 mg | ORAL_TABLET | ORAL | Status: DC | PRN
  Administered 2018-03-09: 10 mg via ORAL
  Filled 2018-03-09 (×3): qty 2

## 2018-03-09 MED ORDER — METOCLOPRAMIDE HCL 5 MG/ML IJ SOLN: 5.0000 mg | Freq: Three times a day (TID) | INTRAMUSCULAR | Status: DC | PRN

## 2018-03-09 MED ORDER — EZETIMIBE 10 MG PO TABS
5.0000 mg | ORAL_TABLET | Freq: Every day | ORAL | Status: DC
  Administered 2018-03-10 – 2018-03-11 (×2): 5 mg via ORAL
  Filled 2018-03-09 (×2): qty 1

## 2018-03-09 MED ORDER — INSULIN ASPART 100 UNIT/ML ~~LOC~~ SOLN
0.0000 [IU] | Freq: Three times a day (TID) | SUBCUTANEOUS | Status: DC
  Administered 2018-03-10 (×2): 5 [IU] via SUBCUTANEOUS

## 2018-03-09 MED ORDER — MIDAZOLAM HCL 2 MG/2ML IJ SOLN
1.0000 mg | INTRAMUSCULAR | Status: DC
  Administered 2018-03-09 (×2): 1 mg via INTRAVENOUS
  Filled 2018-03-09: qty 2

## 2018-03-09 MED ORDER — PROMETHAZINE HCL 25 MG/ML IJ SOLN: 6.2500 mg | INTRAMUSCULAR | Status: DC | PRN

## 2018-03-09 MED ORDER — DOCUSATE SODIUM 100 MG PO CAPS
100.0000 mg | ORAL_CAPSULE | Freq: Two times a day (BID) | ORAL | Status: DC
  Administered 2018-03-09 – 2018-03-11 (×2): 100 mg via ORAL
  Filled 2018-03-09 (×3): qty 1

## 2018-03-09 MED ORDER — ACETAMINOPHEN 10 MG/ML IV SOLN
1000.0000 mg | Freq: Four times a day (QID) | INTRAVENOUS | Status: DC
  Administered 2018-03-09: 1000 mg via INTRAVENOUS
  Filled 2018-03-09: qty 100

## 2018-03-09 MED ORDER — PANTOPRAZOLE SODIUM 40 MG PO TBEC
80.0000 mg | DELAYED_RELEASE_TABLET | Freq: Every day | ORAL | Status: DC
  Administered 2018-03-10: 80 mg via ORAL
  Filled 2018-03-09: qty 2

## 2018-03-09 MED ORDER — ONDANSETRON HCL 4 MG/2ML IJ SOLN
INTRAMUSCULAR | Status: AC
  Filled 2018-03-09: qty 2

## 2018-03-09 MED ORDER — FLEET ENEMA 7-19 GM/118ML RE ENEM: 1.0000 | ENEMA | Freq: Once | RECTAL | Status: DC | PRN

## 2018-03-09 MED ORDER — METHOCARBAMOL 500 MG PO TABS
500.0000 mg | ORAL_TABLET | Freq: Four times a day (QID) | ORAL | Status: DC | PRN
  Administered 2018-03-09 – 2018-03-10 (×3): 500 mg via ORAL
  Filled 2018-03-09 (×4): qty 1

## 2018-03-09 MED ORDER — SODIUM CHLORIDE 0.9 % IV SOLN
INTRAVENOUS | Status: DC
  Administered 2018-03-09 – 2018-03-10 (×2): via INTRAVENOUS

## 2018-03-09 MED ORDER — ARIPIPRAZOLE 2 MG PO TABS
1.0000 mg | ORAL_TABLET | Freq: Every day | ORAL | Status: DC
  Administered 2018-03-09 – 2018-03-10 (×2): 1 mg via ORAL
  Filled 2018-03-09 (×2): qty 1

## 2018-03-09 MED ORDER — LACTATED RINGERS IV SOLN
INTRAVENOUS | Status: DC
  Administered 2018-03-09 (×2): via INTRAVENOUS

## 2018-03-09 MED ORDER — METOCLOPRAMIDE HCL 5 MG PO TABS: 5.0000 mg | ORAL_TABLET | Freq: Three times a day (TID) | ORAL | Status: DC | PRN

## 2018-03-09 MED ORDER — ONDANSETRON HCL 4 MG/2ML IJ SOLN: 4.0000 mg | Freq: Four times a day (QID) | INTRAMUSCULAR | Status: DC | PRN

## 2018-03-09 MED ORDER — TRANEXAMIC ACID-NACL 1000-0.7 MG/100ML-% IV SOLN
1000.0000 mg | INTRAVENOUS | Status: AC
  Administered 2018-03-09: 1000 mg via INTRAVENOUS
  Filled 2018-03-09: qty 100

## 2018-03-09 MED ORDER — KETOROLAC TROMETHAMINE 30 MG/ML IJ SOLN: 30.0000 mg | Freq: Once | INTRAMUSCULAR | Status: DC | PRN

## 2018-03-09 MED ORDER — FENTANYL CITRATE (PF) 100 MCG/2ML IJ SOLN
50.0000 ug | INTRAMUSCULAR | Status: DC
  Administered 2018-03-09 (×2): 50 ug via INTRAVENOUS
  Filled 2018-03-09: qty 2

## 2018-03-09 MED ORDER — PROPOFOL 10 MG/ML IV BOLUS
INTRAVENOUS | Status: AC
  Filled 2018-03-09: qty 80

## 2018-03-09 MED ORDER — DEXAMETHASONE SODIUM PHOSPHATE 10 MG/ML IJ SOLN
INTRAMUSCULAR | Status: AC
  Filled 2018-03-09: qty 1

## 2018-03-09 MED ORDER — PHENOL 1.4 % MT LIQD: 1.0000 | OROMUCOSAL | Status: DC | PRN

## 2018-03-09 MED ORDER — PHENYLEPHRINE 40 MCG/ML (10ML) SYRINGE FOR IV PUSH (FOR BLOOD PRESSURE SUPPORT)
PREFILLED_SYRINGE | INTRAVENOUS | Status: DC | PRN
  Administered 2018-03-09 (×2): 80 ug via INTRAVENOUS

## 2018-03-09 MED ORDER — POLYETHYLENE GLYCOL 3350 17 G PO PACK: 17.0000 g | PACK | Freq: Every day | ORAL | Status: DC | PRN

## 2018-03-09 MED ORDER — DIPHENHYDRAMINE HCL 12.5 MG/5ML PO ELIX: 12.5000 mg | ORAL_SOLUTION | ORAL | Status: DC | PRN

## 2018-03-09 MED ORDER — METHOCARBAMOL 500 MG IVPB - SIMPLE MED
500.0000 mg | Freq: Four times a day (QID) | INTRAVENOUS | Status: DC | PRN
  Administered 2018-03-09: 500 mg via INTRAVENOUS
  Filled 2018-03-09: qty 50

## 2018-03-09 MED ORDER — GABAPENTIN 300 MG PO CAPS
300.0000 mg | ORAL_CAPSULE | Freq: Once | ORAL | Status: AC
  Administered 2018-03-09: 300 mg via ORAL
  Filled 2018-03-09: qty 1

## 2018-03-09 MED ORDER — ALPRAZOLAM 0.5 MG PO TABS
0.5000 mg | ORAL_TABLET | Freq: Every day | ORAL | Status: DC
  Administered 2018-03-09 – 2018-03-10 (×2): 0.5 mg via ORAL
  Filled 2018-03-09 (×2): qty 1

## 2018-03-09 MED ORDER — MORPHINE SULFATE (PF) 2 MG/ML IV SOLN
1.0000 mg | INTRAVENOUS | Status: DC | PRN
  Administered 2018-03-09 – 2018-03-10 (×3): 2 mg via INTRAVENOUS
  Filled 2018-03-09 (×3): qty 1

## 2018-03-09 MED ORDER — SODIUM CHLORIDE (PF) 0.9 % IJ SOLN
INTRAMUSCULAR | Status: DC | PRN
  Administered 2018-03-09: 60 mL

## 2018-03-09 MED ORDER — VANCOMYCIN HCL IN DEXTROSE 1-5 GM/200ML-% IV SOLN
1000.0000 mg | INTRAVENOUS | Status: AC
  Administered 2018-03-09: 1000 mg via INTRAVENOUS
  Filled 2018-03-09: qty 200

## 2018-03-09 MED ORDER — 0.9 % SODIUM CHLORIDE (POUR BTL) OPTIME
TOPICAL | Status: DC | PRN
  Administered 2018-03-09: 1000 mL

## 2018-03-09 MED ORDER — MEPERIDINE HCL 50 MG/ML IJ SOLN: 6.2500 mg | INTRAMUSCULAR | Status: DC | PRN

## 2018-03-09 MED ORDER — PROPOFOL 500 MG/50ML IV EMUL
INTRAVENOUS | Status: DC | PRN
  Administered 2018-03-09: 25 ug/kg/min via INTRAVENOUS

## 2018-03-09 MED ORDER — DEXAMETHASONE SODIUM PHOSPHATE 10 MG/ML IJ SOLN
8.0000 mg | Freq: Once | INTRAMUSCULAR | Status: AC
  Administered 2018-03-09: 8 mg via INTRAVENOUS

## 2018-03-09 MED ORDER — PROPOFOL 10 MG/ML IV BOLUS
INTRAVENOUS | Status: AC
  Filled 2018-03-09: qty 40

## 2018-03-09 MED ORDER — CHLORHEXIDINE GLUCONATE 4 % EX LIQD: 60.0000 mL | Freq: Once | CUTANEOUS | Status: DC

## 2018-03-09 MED ORDER — ONDANSETRON HCL 4 MG/2ML IJ SOLN
INTRAMUSCULAR | Status: DC | PRN
  Administered 2018-03-09: 4 mg via INTRAVENOUS

## 2018-03-09 MED ORDER — MENTHOL 3 MG MT LOZG: 1.0000 | LOZENGE | OROMUCOSAL | Status: DC | PRN

## 2018-03-09 MED ORDER — ROPIVACAINE HCL 5 MG/ML IJ SOLN
INTRAMUSCULAR | Status: DC | PRN
  Administered 2018-03-09 (×2): 5 mL via PERINEURAL

## 2018-03-09 MED ORDER — STERILE WATER FOR IRRIGATION IR SOLN
Status: DC | PRN
  Administered 2018-03-09: 2000 mL

## 2018-03-09 MED ORDER — FENTANYL CITRATE (PF) 100 MCG/2ML IJ SOLN: 25.0000 ug | INTRAMUSCULAR | Status: DC | PRN

## 2018-03-09 MED ORDER — ONDANSETRON HCL 4 MG PO TABS: 4.0000 mg | ORAL_TABLET | Freq: Four times a day (QID) | ORAL | Status: DC | PRN

## 2018-03-09 MED ORDER — ROPIVACAINE HCL 7.5 MG/ML IJ SOLN
INTRAMUSCULAR | Status: DC | PRN
  Administered 2018-03-09 (×4): 5 mL via PERINEURAL

## 2018-03-09 MED ORDER — SODIUM CHLORIDE 0.9 % IR SOLN
Status: DC | PRN
  Administered 2018-03-09: 1000 mL

## 2018-03-09 MED ORDER — BUPIVACAINE IN DEXTROSE 0.75-8.25 % IT SOLN
INTRATHECAL | Status: DC | PRN
  Administered 2018-03-09: 1.4 mL via INTRATHECAL

## 2018-03-09 MED ORDER — ACETAMINOPHEN 500 MG PO TABS
1000.0000 mg | ORAL_TABLET | Freq: Four times a day (QID) | ORAL | Status: AC
  Administered 2018-03-09 – 2018-03-10 (×4): 1000 mg via ORAL
  Filled 2018-03-09 (×5): qty 2

## 2018-03-09 MED ORDER — BUPIVACAINE LIPOSOME 1.3 % IJ SUSP
INTRAMUSCULAR | Status: DC | PRN
  Administered 2018-03-09: 20 mL

## 2018-03-09 MED ORDER — GABAPENTIN 300 MG PO CAPS
600.0000 mg | ORAL_CAPSULE | Freq: Two times a day (BID) | ORAL | Status: DC
  Administered 2018-03-09 – 2018-03-11 (×4): 600 mg via ORAL
  Filled 2018-03-09 (×4): qty 2

## 2018-03-09 MED ORDER — TRANEXAMIC ACID-NACL 1000-0.7 MG/100ML-% IV SOLN
1000.0000 mg | Freq: Once | INTRAVENOUS | Status: AC
  Administered 2018-03-09: 1000 mg via INTRAVENOUS
  Filled 2018-03-09: qty 100

## 2018-03-09 MED ORDER — ASPIRIN EC 325 MG PO TBEC
325.0000 mg | DELAYED_RELEASE_TABLET | Freq: Two times a day (BID) | ORAL | Status: DC
  Administered 2018-03-10 – 2018-03-11 (×3): 325 mg via ORAL
  Filled 2018-03-09 (×3): qty 1

## 2018-03-09 MED ORDER — BISACODYL 10 MG RE SUPP: 10.0000 mg | Freq: Every day | RECTAL | Status: DC | PRN

## 2018-03-09 MED ORDER — VANCOMYCIN HCL IN DEXTROSE 1-5 GM/200ML-% IV SOLN
1000.0000 mg | Freq: Two times a day (BID) | INTRAVENOUS | Status: AC
  Administered 2018-03-09: 1000 mg via INTRAVENOUS
  Filled 2018-03-09: qty 200

## 2018-03-09 MED ORDER — ESCITALOPRAM OXALATE 20 MG PO TABS
20.0000 mg | ORAL_TABLET | Freq: Every day | ORAL | Status: DC
  Administered 2018-03-10 – 2018-03-11 (×2): 20 mg via ORAL
  Filled 2018-03-09 (×2): qty 1

## 2018-03-09 MED ORDER — DEXAMETHASONE SODIUM PHOSPHATE 10 MG/ML IJ SOLN
10.0000 mg | Freq: Once | INTRAMUSCULAR | Status: AC
  Administered 2018-03-10: 10 mg via INTRAVENOUS
  Filled 2018-03-09: qty 1

## 2018-03-09 MED ORDER — OXYCODONE HCL 5 MG PO TABS
10.0000 mg | ORAL_TABLET | ORAL | Status: DC | PRN
  Administered 2018-03-09 – 2018-03-10 (×2): 10 mg via ORAL

## 2018-03-09 MED ORDER — METHOCARBAMOL 500 MG IVPB - SIMPLE MED
INTRAVENOUS | Status: AC
  Filled 2018-03-09: qty 50

## 2018-03-09 SURGICAL SUPPLY — 79 items
ADAPTER BOLT FEMORAL +2/-2 (Knees) ×1 IMPLANT
ADPR FEM +2/-2 OFST BOLT (Knees) ×1 IMPLANT
ADPR FEM 5D STRL KN PFC SGM (Orthopedic Implant) ×1 IMPLANT
AUG FEM SZ3 4 STRL LF KN RT TI (Knees) ×1 IMPLANT
AUG FEM SZ3 8 CMB POST STRL LF (Knees) ×2 IMPLANT
AUG FEM SZ3 8 STRL LF KN RT TI (Knees) ×1 IMPLANT
AUG TIB SZ3 10 REV STP WDG (Knees) ×2 IMPLANT
AUGMENT DIST PFC 8MM SZ2 RT (Knees) IMPLANT
BAG DECANTER FOR FLEXI CONT (MISCELLANEOUS) ×2 IMPLANT
BAG SPEC THK2 15X12 ZIP CLS (MISCELLANEOUS)
BAG ZIPLOCK 12X15 (MISCELLANEOUS) IMPLANT
BANDAGE ACE 6X5 VEL STRL LF (GAUZE/BANDAGES/DRESSINGS) ×2 IMPLANT
BANDAGE ELASTIC 6 VELCRO ST LF (GAUZE/BANDAGES/DRESSINGS) ×1 IMPLANT
BLADE SAG 18X100X1.27 (BLADE) ×1 IMPLANT
BLADE SAW SGTL 11.0X1.19X90.0M (BLADE) ×1 IMPLANT
BONE CEMENT GENTAMICIN (Cement) ×6 IMPLANT
CEMENT BONE GENTAMICIN 40 (Cement) ×3 IMPLANT
CEMENT RESTRICTOR DEPUY SZ 4 (Cement) ×1 IMPLANT
CLOTH BEACON ORANGE TIMEOUT ST (SAFETY) ×1 IMPLANT
COMP FEM CEM RT SZ3 (Orthopedic Implant) ×2 IMPLANT
COMPONENT FEM CEM RT SZ3 (Orthopedic Implant) IMPLANT
COVER SURGICAL LIGHT HANDLE (MISCELLANEOUS) ×2 IMPLANT
COVER WAND RF STERILE (DRAPES) ×1 IMPLANT
CUFF TOURN SGL QUICK 34 (TOURNIQUET CUFF) ×2
CUFF TRNQT CYL 34X4X40X1 (TOURNIQUET CUFF) ×1 IMPLANT
DECANTER SPIKE VIAL GLASS SM (MISCELLANEOUS) ×2 IMPLANT
DISAL AUG PFC 8MM SZ2 RT (Knees) ×2 IMPLANT
DISTAL WEDGE PFC 4MM RIGHT (Knees) ×2 IMPLANT
DRAPE U-SHAPE 47X51 STRL (DRAPES) ×2 IMPLANT
DRSG ADAPTIC 3X8 NADH LF (GAUZE/BANDAGES/DRESSINGS) ×2 IMPLANT
DRSG PAD ABDOMINAL 8X10 ST (GAUZE/BANDAGES/DRESSINGS) ×3 IMPLANT
DURAPREP 26ML APPLICATOR (WOUND CARE) ×2 IMPLANT
ELECT REM PT RETURN 15FT ADLT (MISCELLANEOUS) ×2 IMPLANT
EVACUATOR 1/8 PVC DRAIN (DRAIN) ×2 IMPLANT
FEMORAL ADAPTER (Orthopedic Implant) ×1 IMPLANT
GAUZE SPONGE 4X4 12PLY STRL (GAUZE/BANDAGES/DRESSINGS) ×2 IMPLANT
GLOVE BIO SURGEON STRL SZ7 (GLOVE) ×2 IMPLANT
GLOVE BIO SURGEON STRL SZ8 (GLOVE) ×2 IMPLANT
GLOVE BIOGEL PI IND STRL 7.0 (GLOVE) ×1 IMPLANT
GLOVE BIOGEL PI IND STRL 8 (GLOVE) ×1 IMPLANT
GLOVE BIOGEL PI INDICATOR 7.0 (GLOVE) ×2
GLOVE BIOGEL PI INDICATOR 8 (GLOVE) ×1
GLOVE SURG SS PI 7.0 STRL IVOR (GLOVE) ×1 IMPLANT
GOWN STRL REUS W/TWL LRG LVL3 (GOWN DISPOSABLE) ×2 IMPLANT
GOWN STRL REUS W/TWL XL LVL3 (GOWN DISPOSABLE) ×2 IMPLANT
HANDPIECE INTERPULSE COAX TIP (DISPOSABLE) ×2
HOLDER FOLEY CATH W/STRAP (MISCELLANEOUS) ×1 IMPLANT
IMMOBILIZER KNEE 20 (SOFTGOODS) ×2
IMMOBILIZER KNEE 20 THIGH 36 (SOFTGOODS) ×1 IMPLANT
INSERT TC3 TIBIAL SZ3 20.0 (Knees) ×1 IMPLANT
MANIFOLD NEPTUNE II (INSTRUMENTS) ×2 IMPLANT
NS IRRIG 1000ML POUR BTL (IV SOLUTION) ×2 IMPLANT
PACK TOTAL KNEE CUSTOM (KITS) ×2 IMPLANT
PADDING CAST COTTON 6X4 STRL (CAST SUPPLIES) ×4 IMPLANT
PIN STEINMAN FIXATION KNEE (PIN) ×1 IMPLANT
POST AVE PFC 8MM (Knees) ×2 IMPLANT
PROTECTOR NERVE ULNAR (MISCELLANEOUS) ×2 IMPLANT
SET HNDPC FAN SPRY TIP SCT (DISPOSABLE) ×1 IMPLANT
STEM TIBIA PFC 13X30MM (Stem) ×1 IMPLANT
STEM UNIVERSAL REVISION 75X18 (Stem) ×1 IMPLANT
STRIP CLOSURE SKIN 1/2X4 (GAUZE/BANDAGES/DRESSINGS) ×3 IMPLANT
SUT MNCRL AB 4-0 PS2 18 (SUTURE) ×2 IMPLANT
SUT STRATAFIX 0 PDS 27 VIOLET (SUTURE) ×2
SUT VIC AB 2-0 CT1 27 (SUTURE) ×8
SUT VIC AB 2-0 CT1 TAPERPNT 27 (SUTURE) ×3 IMPLANT
SUTURE STRATFX 0 PDS 27 VIOLET (SUTURE) ×1 IMPLANT
SWAB COLLECTION DEVICE MRSA (MISCELLANEOUS) IMPLANT
SWAB CULTURE ESWAB REG 1ML (MISCELLANEOUS) IMPLANT
SYR 50ML LL SCALE MARK (SYRINGE) ×4 IMPLANT
TOWER CARTRIDGE SMART MIX (DISPOSABLE) ×2 IMPLANT
TRAY FOLEY CATH 14FRSI W/METER (CATHETERS) ×1 IMPLANT
TRAY FOLEY MTR SLVR 16FR STAT (SET/KITS/TRAYS/PACK) ×1 IMPLANT
TRAY REVISION SZ 3 (Knees) ×1 IMPLANT
TRAY SLEEVE CEM ML (Knees) ×1 IMPLANT
TUBE KAMVAC SUCTION (TUBING) IMPLANT
WATER STERILE IRR 1000ML POUR (IV SOLUTION) ×1 IMPLANT
WEDGE DISTAL PFC RIGHT 4MM (Knees) IMPLANT
WEDGE SZ 3 10MM (Knees) ×2 IMPLANT
WRAP KNEE MAXI GEL POST OP (GAUZE/BANDAGES/DRESSINGS) ×1 IMPLANT

## 2018-03-09 NOTE — Anesthesia Procedure Notes (Signed)
Procedure Name: MAC Date/Time: 03/09/2018 12:42 PM Performed by: West Pugh, CRNA Pre-anesthesia Checklist: Patient identified, Emergency Drugs available, Suction available, Patient being monitored and Timeout performed Patient Re-evaluated:Patient Re-evaluated prior to induction Oxygen Delivery Method: Simple face mask Induction Type: IV induction Placement Confirmation: positive ETCO2 Dental Injury: Teeth and Oropharynx as per pre-operative assessment

## 2018-03-09 NOTE — Anesthesia Postprocedure Evaluation (Signed)
Anesthesia Post Note  Patient: [name]  Procedure(s) Performed: RIGHT TOTAL KNEE REVISION (Right Knee)     Patient location during evaluation: PACU Anesthesia Type: Spinal Level of consciousness: awake Pain management: pain level controlled Vital Signs Assessment: post-procedure vital signs reviewed and stable Respiratory status: spontaneous breathing Cardiovascular status: stable Postop Assessment: no headache, no backache, spinal receding and no apparent nausea or vomiting Anesthetic complications: no    Last Vitals:  Vitals:   03/09/18 1631 03/09/18 1633  BP: 134/74 134/74  Pulse: 76 77  Resp: 16 (!) 1  Temp: 36.7 C 36.4 C  SpO2: 94% 98%    Last Pain:  Vitals:   03/09/18 1633  TempSrc: Oral  PainSc:    Pain Goal:                 Huston Foley

## 2018-03-09 NOTE — Transfer of Care (Signed)
Immediate Anesthesia Transfer of Care Note  Patient: KANDAS OLIVETO  Procedure(s) Performed: RIGHT TOTAL KNEE REVISION (Right Knee)  Patient Location: PACU  Anesthesia Type:Spinal and MAC combined with regional for post-op pain  Level of Consciousness: awake, alert , oriented and patient cooperative  Airway & Oxygen Therapy: Patient Spontanous Breathing and Patient connected to face mask oxygen  Post-op Assessment: Report given to RN and Post -op Vital signs reviewed and stable  Post vital signs: Reviewed and stable  Last Vitals:  Vitals Value Taken Time  BP 122/72 03/09/2018  3:01 PM  Temp 36.7 C 03/09/2018  3:01 PM  Pulse 75 03/09/2018  3:05 PM  Resp 19 03/09/2018  3:05 PM  SpO2 100 % 03/09/2018  3:05 PM  Vitals shown include unvalidated device data.  Last Pain:  Vitals:   03/09/18 1501  TempSrc:   PainSc: 0-No pain         Complications: No apparent anesthesia complications

## 2018-03-09 NOTE — Anesthesia Procedure Notes (Signed)
Spinal  Patient location during procedure: OR Start time: 03/09/2018 12:42 PM End time: 03/09/2018 12:45 PM Staffing Anesthesiologist: Lyn Hollingshead, MD Performed: anesthesiologist  Preanesthetic Checklist Completed: patient identified, site marked, surgical consent, pre-op evaluation, timeout performed, IV checked, risks and benefits discussed and monitors and equipment checked Spinal Block Patient position: sitting Prep: site prepped and draped and DuraPrep Patient monitoring: continuous pulse ox and blood pressure Approach: midline Location: L3-4 Injection technique: single-shot Needle Needle type: Pencan  Needle gauge: 24 G Needle length: 10 cm Needle insertion depth: 7 cm Assessment Sensory level: T8

## 2018-03-09 NOTE — Anesthesia Preprocedure Evaluation (Signed)
Anesthesia Evaluation  Patient identified by MRN, date of birth, ID band Patient awake    Reviewed: Allergy & Precautions, NPO status , Patient's Chart, lab work & pertinent test results  Airway Mallampati: I       Dental no notable dental hx. (+) Teeth Intact   Pulmonary former smoker,    Pulmonary exam normal breath sounds clear to auscultation       Cardiovascular hypertension, Pt. on medications Normal cardiovascular exam Rhythm:Regular Rate:Normal     Neuro/Psych    GI/Hepatic Neg liver ROS, GERD  Medicated,  Endo/Other  diabetes, Type 2, Oral Hypoglycemic Agents  Renal/GU negative Renal ROS     Musculoskeletal  (+) Arthritis , Osteoarthritis,  Fibromyalgia -  Abdominal (+) + obese,   Peds  Hematology   Anesthesia Other Findings   Reproductive/Obstetrics                             Anesthesia Physical Anesthesia Plan  ASA: II  Anesthesia Plan: Spinal   Post-op Pain Management:  Regional for Post-op pain   Induction:   PONV Risk Score and Plan: 2 and Ondansetron and Dexamethasone  Airway Management Planned: Natural Airway, Nasal Cannula and Simple Face Mask  Additional Equipment:   Intra-op Plan:   Post-operative Plan:   Informed Consent: I have reviewed the patients History and Physical, chart, labs and discussed the procedure including the risks, benefits and alternatives for the proposed anesthesia with the patient or authorized representative who has indicated his/her understanding and acceptance.     Plan Discussed with: CRNA  Anesthesia Plan Comments:         Anesthesia Quick Evaluation

## 2018-03-09 NOTE — Brief Op Note (Signed)
03/09/2018  2:54 PM  PATIENT:  [name]  72 y.o. female  PRE-OPERATIVE DIAGNOSIS:  loose right total knee arthroplasty  POST-OPERATIVE DIAGNOSIS:  loose right total knee arthroplasty  PROCEDURE:  Procedure(s) with comments: RIGHT TOTAL KNEE REVISION (Right) - 172min with abductor block  SURGEON:  Surgeon(s) and Role:    Gaynelle Arabian, MD - Primary  PHYSICIAN ASSISTANT:   ASSISTANTS: Theresa Duty, PA-C   ANESTHESIA:   Adductor canal block and spinal  EBL:  100 mL   BLOOD ADMINISTERED:none  DRAINS: (Medium) Hemovact drain(s) in the right knee with  Suction Open   LOCAL MEDICATIONS USED:  OTHER exparel  COUNTS:  YES  TOURNIQUET:   Total Tourniquet Time Documented: Thigh (Right) - 46 minutes Thigh (Right) - 19 minutes Total: Thigh (Right) - 65 minutes   DICTATION: .Other Dictation: Dictation Number [id number]  PLAN OF CARE: Admit to inpatient   PATIENT DISPOSITION:  PACU - hemodynamically stable.

## 2018-03-09 NOTE — Evaluation (Signed)
Physical Therapy Evaluation Patient Details Name: [name] MRN: [id number] DOB: [birth year] Today's Date: 03/09/2018   History of Present Illness  72 yo female s/p R TK revision on 03/09/18. PMH includes atherosclerosis, DMII, depression, GERD, peripheral neuropathy, lumbosacral DDD, fibromyalgia, HTN, osteopenia, OA, bilateral TKRs.   Clinical Impression   Pt presents with R knee pain, decreased R knee ROM, difficulty performing bed mobility without assist, and decreased activity tolerance due to pain. Pt to benefit from acute PT to address deficits. Pt ambulated 40 ft with RW with min guard assist. Pt educated on quad sets (5-10/hour), ankle pumps (20/hour), and heel slides (5-10/hour) to perform this afternoon/evening to lessen stiffness and increase circulation, to pt's tolerance and limited by pain. PT to progress mobility as tolerated, and will continue to follow acutely.      Follow Up Recommendations Follow surgeon's recommendation for DC plan and follow-up therapies;Supervision for mobility/OOB(OPPT)    Equipment Recommendations  None recommended by PT    Recommendations for Other Services       Precautions / Restrictions Precautions Precautions: Fall Required Braces or Orthoses: Knee Immobilizer - Right Knee Immobilizer - Right: On when out of bed or walking;Discontinue once straight leg raise with < 10 degree lag Restrictions Weight Bearing Restrictions: No Other Position/Activity Restrictions: WBAT       Mobility  Bed Mobility Overal bed mobility: Needs Assistance Bed Mobility: Supine to Sit     Supine to sit: Min assist;HOB elevated     General bed mobility comments: Min assist for RLE management, scooting to EOB. Verbal cuing for sequencing.   Transfers Overall transfer level: Needs assistance Equipment used: Rolling walker (2 wheeled) Transfers: Sit to/from Stand Sit to Stand: Min guard;From elevated surface         General transfer comment: Min  guard for safety, verbal cuing for hand placement but pt used both UEs on RW despite cue. Pt with self-steadying and weight shifting upon standing.   Ambulation/Gait Ambulation/Gait assistance: Min guard Gait Distance (Feet): 40 Feet Assistive device: Rolling walker (2 wheeled) Gait Pattern/deviations: Step-to pattern;Decreased stride length;Antalgic;Decreased weight shift to right Gait velocity: decr    General Gait Details: Min guard for safety. Pt with proper placement in RW with mobility, verbal cuing for sequencing.   Stairs            Wheelchair Mobility    Modified Rankin (Stroke Patients Only)       Balance Overall balance assessment: Mild deficits observed, not formally tested                                           Pertinent Vitals/Pain Pain Assessment: 0-10 Pain Score: 6  Pain Location: R knee  Pain Descriptors / Indicators: Sore;Aching Pain Intervention(s): Limited activity within patient's tolerance;Repositioned;Ice applied;Monitored during session;Premedicated before session    Home Living Family/patient expects to be discharged to:: Private residence Living Arrangements: Spouse/significant other Available Help at Discharge: Family;Available 24 hours/day(granddaughter will stay with pt from midday throughout the night for as long as needed, husband) Type of Home: House Home Access: Stairs to enter Entrance Stairs-Rails: None Entrance Stairs-Number of Steps: 1 Home Layout: One level Home Equipment: Walker - 2 wheels;Cane - single point;Bedside commode      Prior Function Level of Independence: Independent with assistive device(s)         Comments: Pt using RW for the  week before surgery due to pain and instability      Hand Dominance   Dominant Hand: Left    Extremity/Trunk Assessment   Upper Extremity Assessment Upper Extremity Assessment: Overall WFL for tasks assessed    Lower Extremity Assessment Lower Extremity  Assessment: Generalized weakness;RLE deficits/detail RLE Deficits / Details: supected post-surgical weakness; unable to perform SLR without assist, able to perform both ankle pumps and quad sets.  RLE Sensation: history of peripheral neuropathy    Cervical / Trunk Assessment Cervical / Trunk Assessment: Normal  Communication   Communication: No difficulties  Cognition Arousal/Alertness: Awake/alert Behavior During Therapy: WFL for tasks assessed/performed Overall Cognitive Status: Within Functional Limits for tasks assessed                                        General Comments      Exercises Total Joint Exercises Goniometric ROM: Knee AAROM ~10-60*, limited by pain    Assessment/Plan    PT Assessment Patient needs continued PT services  PT Problem List Decreased strength;Pain;Decreased range of motion;Decreased activity tolerance;Decreased knowledge of use of DME;Decreased balance;Decreased mobility       PT Treatment Interventions DME instruction;Therapeutic activities;Gait training;Patient/family education;Therapeutic exercise;Stair training;Balance training;Functional mobility training    PT Goals (Current goals can be found in the Care Plan section)  Acute Rehab PT Goals Patient Stated Goal: none stated  PT Goal Formulation: With patient Time For Goal Achievement: 03/16/18 Potential to Achieve Goals: Good    Frequency 7X/week   Barriers to discharge        Co-evaluation               AM-PAC PT "6 Clicks" Mobility  Outcome Measure Help needed turning from your back to your side while in a flat bed without using bedrails?: A Little Help needed moving from lying on your back to sitting on the side of a flat bed without using bedrails?: A Little Help needed moving to and from a bed to a chair (including a wheelchair)?: A Little Help needed standing up from a chair using your arms (e.g., wheelchair or bedside chair)?: A Little Help needed to  walk in hospital room?: A Little Help needed climbing 3-5 steps with a railing? : A Little 6 Click Score: 18    End of Session Equipment Utilized During Treatment: Gait belt;Right knee immobilizer Activity Tolerance: Patient tolerated treatment well Patient left: in chair;with call bell/phone within reach;with SCD's reapplied;with nursing/sitter in room(Pt agrees to press call bell and wait for assist before mobilizing) Nurse Communication: Mobility status PT Visit Diagnosis: Other abnormalities of gait and mobility (R26.89);Difficulty in walking, not elsewhere classified (R26.2)    Time: 1810-1835 PT Time Calculation (min) (ACUTE ONLY): 25 min   Charges:   PT Evaluation $PT Eval Low Complexity: 1 Low PT Treatments $Gait Training: 8-22 mins       Julien Girt, PT Acute Rehabilitation Services Pager [phone]  Office [phone]   Roxine Caddy D Elonda Husky 03/09/2018, 7:23 PM

## 2018-03-09 NOTE — Progress Notes (Signed)
Assisted Dr. Hatchett with right, ultrasound guided, adductor canal block. Side rails up, monitors on throughout procedure. See vital signs in flow sheet. Tolerated Procedure well.  

## 2018-03-09 NOTE — Anesthesia Procedure Notes (Signed)
Anesthesia Regional Block: Adductor canal block   Pre-Anesthetic Checklist: ,, timeout performed, Correct Patient, Correct Site, Correct Laterality, Correct Procedure, Correct Position, site marked, Risks and benefits discussed,  Surgical consent,  Pre-op evaluation,  At surgeon's request and post-op pain management  Laterality: Lower and Right  Prep: chloraprep       Needles:  Injection technique: Single-shot  Needle Type: Echogenic Stimulator Needle     Needle Length: 10cm  Needle Gauge: 21   Needle insertion depth: 3 cm   Additional Needles:   Procedures:,,,, ultrasound used (permanent image in chart),,,,  Narrative:  Start time: 03/09/2018 11:46 AM End time: 03/09/2018 11:56 AM Injection made incrementally with aspirations every 5 mL.  Performed by: Personally  Anesthesiologist: Lyn Hollingshead, MD

## 2018-03-09 NOTE — Op Note (Signed)
NAME: CARRISA, KELLER MEDICAL RECORD [id number] ACCOUNT [id number] DATE OF BIRTH:1946/03/21 FACILITY: [facility] LOCATION: [facility] PHYSICIAN:Theotis Gerdeman Zella Ball, MD  OPERATIVE REPORT  DATE OF PROCEDURE:  03/09/2018  PREOPERATIVE DIAGNOSIS:  Failed loose right total knee arthroplasty.  POSTOPERATIVE DIAGNOSIS:  Failed loose right total knee arthroplasty.  PROCEDURE:  Right total knee arthroplasty revision.  SURGEON:  Gaynelle Arabian, MD  ASSISTANT:  Theresa Duty, PA-C  ANESTHESIA:  Adductor canal block and spinal.  ESTIMATED BLOOD LOSS:  100.  DRAINS:  Hemovac x1.  TOURNIQUET TIME:  Up 49 minutes at 300 mmHg, down 8 minutes, up additional 19 minutes at 300 mmHg.  COMPLICATIONS:  None.  CONDITION:  Stable to recovery.  BRIEF CLINICAL NOTE:  The patient is a 72 year old female who had a right total knee arthroplasty done 7 years ago.  She did fine until recently when she developed increasing pain and dysfunction.  Radiographs showed a loose tibial component.  Infection  workup was negative.  She presents now for a right total knee arthroplasty revision.  PROCEDURE IN DETAIL:  After successful administration of adductor canal block and spinal anesthetic, a tourniquet was placed high on the right thigh and right lower extremity is prepped and draped in the usual sterile fashion.  Extremity was wrapped in  Esmarch knee flexed and tourniquet inflated to 300 mmHg.  Midline incision was made with a 10 blade through subcutaneous tissue to the level of the extensor mechanism.  A fresh blade was used to make a medial parapatellar arthrotomy.  There was a very  small amount of fluid in the joint.  There is a fair amount of synovitic debris in the joint.  A thorough synovectomy is performed.  The soft tissue over the proximal medial tibia was then subperiosteally elevated the joint line with a knife and into the  semimembranosus bursa with a Cobb elevator.  Soft tissue laterally was elevated  with attention being paid to avoid the patellar tendon on the tibial tubercle.  I was able to evert the patella flex the knee 90 degrees and then removed the tibial  polyethylene.  She had a Sigma rotating platform total knee in place.  The tibial component was grossly loose.  I easily remove this without having to disrupt the interface as it was already loose.  There was a fair amount of fibrinous debris underneath  the tray.  I removed all that.  The cement was then removed from the tibial canal.  We thoroughly irrigated the canal and then reamed up to 13 mm for a 13 mm stem.  The extramedullary tibial alignment guide was placed referencing proximally at the medial  aspect of tibial tubercle and distally along the second metatarsal axis and tibial crest.  Block was pinned to remove about 4 mm of the previously cut bone surface.  This led to a very stable and level  resection level.  Size 3 was most appropriate  tibial component.  Proximal tibia was prepared a modular drill, the modular drill plus stem extension for the size 3.  I then prepared for a 29 mm sleeve by broaching with a 29 broach.  The tibial preparation was then complete.  On the femoral side, I disrupted the interface between the femoral component bone with osteotomes and removed the femoral component with minimal to no bone loss.  Cement was removed. Again, there was a large amount of fibrinous debris present with some  contained bone loss on the medial and lateral condyles especially posteriorly.  We did a thorough debridement of this tissue.  I then accessed the femoral canal thoroughly irrigated and reamed up to 18 mm, which had an excellent fit.  An 18 mm reamer was  left in place to serve as our intramedullary cutting guide.  Distal femoral cutting block is placed.  I had to go into the +8 position laterally and +4 position medially to get back to restore the normal joint line.  Resection was made with an  oscillating saw.  Size 3 was  most appropriate femoral component.  The size 3 AP cutting block was then placed with rotation marked off the epicondylar axis and confirmed by creating a rectangular flexion gap at 90 degrees.  We did not get any bone  anteriorly and posteriorly or in the chamfers.  I had to go up to 8 mm posteriorly, thus 8 mm augments were necessary,  medial and lateral, posterior.  Intercondylar block is placed in the intercondylar cut made for the TC3 component.  Trials were then placed on the tibial side of the trial, a size 3 MBT revision tray with a 13 x 30 stem extension, 10 mm augment medial 10 mm augment lateral and a 29 sleeve.  On the femoral side, a size 3 TC3 femur with an 18 x 75 stem in a +2 position  in 5 degrees of valgus and any augments were 8 mm distal lateral 4 mm distal medial 4 mm distal lateral and an 8 mm posterior, medial and lateral.  The trials were placed.  Excellent fit and very stable.  We had a 22. 5 insert to gain stability.  Full  extension was achieved with excellent varus/valgus and AP balance throughout full range of motion.  The patella tracked normally.  I did a patelloplasty removing all soft tissue from around the patellar component.  Patellar component had minimal to no  wear and was stable.  I then released the tourniquet for 8 minutes to see if there was any bleeding and all minor bleeding was stopped with cautery.  During that 8 minutes, components were assembled on the back table.  After 8 minutes, we rewrapped the  leg in Esmarch and reinflated the tourniquet at 300 mmHg.  Once the components were fully assembled and the cement was mixed, which is 3 batches of gentamicin impregnated cement, the trials were removed and the cut bone surfaces prepared with pulsatile lavage.  Once ready for implantation the size 3 MBT revision  tray with 10 mm augments medial and lateral 29 sleeve and 13 x 30 stem extension was cemented into the tibia.  Extruded cement was removed.  Femoral side  is the same size as the trials and cemented distally with a press-fit stem.  The trial 22.5 insert  was placed.  Knee held in full extension.  All extruded cement removed.  Cement was fully hardened, the knee permanent, size 3 TC3 rotating platform insert 2.5 mm thickness was placed into the tibial tray.  The knee was reduced with outstanding stability  throughout full range of motion.  Wound was then copiously irrigated with saline solution.  Twenty mL of Exparel mixed with 60 mL of saline was injected into the extensor mechanism, periosteum of the femur and subcutaneous tissues.  The arthrotomy was  then closed over a Hemovac drain with a running 0 Stratafix suture.  Flexion against gravity was about 135 degrees and patella tracks normally.  Tourniquet was released for the second tourniquet time of 19 minutes.  The subcu was irrigated and that is  also closed over a drain with interrupted 2-0 Vicryl.  Subcuticular layer was closed with running 4-0 Monocryl.  Incisions cleaned and dried and Steri-Strips and a bulky sterile dressing were applied.  She was then placed into a knee immobilizer,  awakened and transported to recovery in stable condition.  Note that a surgical assistant was a medical necessity for this procedure to do it in a safe and expeditious manner.  Surgical assistant was necessary for retraction of vital ligaments and neurovascular structures and for proper placement of the limb for  safe removal of the old implant and safe and accurate placement of the new implant.  AN/NUANCE  D:03/09/2018 T:03/09/2018 JOB:[id number]

## 2018-03-09 NOTE — Interval H&P Note (Signed)
History and Physical Interval Note:  03/09/2018 11:13 AM  [name]  has presented today for surgery, with the diagnosis of loose right total knee arthroplasty  The various methods of treatment have been discussed with the patient and family. After consideration of risks, benefits and other options for treatment, the patient has consented to  Procedure(s) with comments: RIGHT TOTAL KNEE REVISION (Right) - 131min as a surgical intervention .  The patient's history has been reviewed, patient examined, no change in status, stable for surgery.  I have reviewed the patient's chart and labs.  Questions were answered to the patient's satisfaction.     Pilar Plate Lillianne Eick

## 2018-03-09 NOTE — Discharge Instructions (Signed)
° °Dr. Frank Aluisio °Total Joint Specialist °[facility] °[address] °[place], [place] [address] °[phone] ° °TOTAL KNEE REPLACEMENT POSTOPERATIVE DIRECTIONS ° °Knee Rehabilitation, Guidelines Following Surgery  °Results after knee surgery are often greatly improved when you follow the exercise, range of motion and muscle strengthening exercises prescribed by your doctor. Safety measures are also important to protect the knee from further injury. Any time any of these exercises cause you to have increased pain or swelling in your knee joint, decrease the amount until you are comfortable again and slowly increase them. If you have problems or questions, call your caregiver or physical therapist for advice.  ° °HOME CARE INSTRUCTIONS  °• Remove items at home which could result in a fall. This includes throw rugs or furniture in walking pathways.  °· ICE to the affected knee every three hours for 30 minutes at a time and then as needed for pain and swelling.  Continue to use ice on the knee for pain and swelling from surgery. You may notice swelling that will progress down to the foot and ankle.  This is normal after surgery.  Elevate the leg when you are not up walking on it.   °· Continue to use the breathing machine which will help keep your temperature down.  It is common for your temperature to cycle up and down following surgery, especially at night when you are not up moving around and exerting yourself.  The breathing machine keeps your lungs expanded and your temperature down. °· Do not place pillow under knee, focus on keeping the knee straight while resting ° °DIET °You may resume your previous home diet once your are discharged from the hospital. ° °DRESSING / WOUND CARE / SHOWERING °You may shower 3 days after surgery, but keep the wounds dry during showering.  You may use an occlusive plastic wrap (Press'n Seal for example), NO SOAKING/SUBMERGING IN THE BATHTUB.  If the bandage  gets wet, change with a clean dry gauze.  If the incision gets wet, pat the wound dry with a clean towel. °You may start showering once you are discharged home but do not submerge the incision under water. Just pat the incision dry and apply a dry gauze dressing on daily. °Change the surgical dressing daily and reapply a dry dressing each time. ° °ACTIVITY °Walk with your walker as instructed. °Use walker as long as suggested by your caregivers. °Avoid periods of inactivity such as sitting longer than an hour when not asleep. This helps prevent blood clots.  °You may resume a sexual relationship in one month or when given the OK by your doctor.  °You may return to work once you are cleared by your doctor.  °Do not drive a car for 6 weeks or until released by you surgeon.  °Do not drive while taking narcotics. ° °WEIGHT BEARING °Weight bearing as tolerated with assist device (walker, cane, etc) as directed, use it as long as suggested by your surgeon or therapist, typically at least 4-6 weeks. ° °POSTOPERATIVE CONSTIPATION PROTOCOL °Constipation - defined medically as fewer than three stools per week and severe constipation as less than one stool per week. ° °One of the most common issues patients have following surgery is constipation.  Even if you have a regular bowel pattern at home, your normal regimen is likely to be disrupted due to multiple reasons following surgery.  Combination of anesthesia, postoperative narcotics, change in appetite and fluid intake all can affect your bowels.    In order to avoid complications following surgery, here are some recommendations in order to help you during your recovery period. ° °Colace (docusate) - Pick up an over-the-counter form of Colace or another stool softener and take twice a day as long as you are requiring postoperative pain medications.  Take with a full glass of water daily.  If you experience loose stools or diarrhea, hold the colace until you stool forms back  up.  If your symptoms do not get better within 1 week or if they get worse, check with your doctor. ° °Dulcolax (bisacodyl) - Pick up over-the-counter and take as directed by the product packaging as needed to assist with the movement of your bowels.  Take with a full glass of water.  Use this product as needed if not relieved by Colace only.  ° °MiraLax (polyethylene glycol) - Pick up over-the-counter to have on hand.  MiraLax is a solution that will increase the amount of water in your bowels to assist with bowel movements.  Take as directed and can mix with a glass of water, juice, soda, coffee, or tea.  Take if you go more than two days without a movement. °Do not use MiraLax more than once per day. Call your doctor if you are still constipated or irregular after using this medication for 7 days in a row. ° °If you continue to have problems with postoperative constipation, please contact the office for further assistance and recommendations.  If you experience "the worst abdominal pain ever" or develop nausea or vomiting, please contact the office immediatly for further recommendations for treatment. ° °ITCHING ° If you experience itching with your medications, try taking only a single pain pill, or even half a pain pill at a time.  You can also use Benadryl over the counter for itching or also to help with sleep.  ° °TED HOSE STOCKINGS °Wear the elastic stockings on both legs for three weeks following surgery during the day but you may remove then at night for sleeping. ° °MEDICATIONS °See your medication summary on the “After Visit Summary” that the nursing staff will review with you prior to discharge.  You may have some home medications which will be placed on hold until you complete the course of blood thinner medication.  It is important for you to complete the blood thinner medication as prescribed by your surgeon.  Continue your approved medications as instructed at time of discharge. ° °PRECAUTIONS °If  you experience chest pain or shortness of breath - call 911 immediately for transfer to the hospital emergency department.  °If you develop a fever greater that 101 F, purulent drainage from wound, increased redness or drainage from wound, foul odor from the wound/dressing, or calf pain - CONTACT YOUR SURGEON.   °                                                °FOLLOW-UP APPOINTMENTS °Make sure you keep all of your appointments after your operation with your surgeon and caregivers. You should call the office at the above phone number and make an appointment for approximately two weeks after the date of your surgery or on the date instructed by your surgeon outlined in the "After Visit Summary". ° ° °RANGE OF MOTION AND STRENGTHENING EXERCISES  °Rehabilitation of the knee is important following a knee injury or   an operation. After just a few days of immobilization, the muscles of the thigh which control the knee become weakened and shrink (atrophy). Knee exercises are designed to build up the tone and strength of the thigh muscles and to improve knee motion. Often times heat used for twenty to thirty minutes before working out will loosen up your tissues and help with improving the range of motion but do not use heat for the first two weeks following surgery. These exercises can be done on a training (exercise) mat, on the floor, on a table or on a bed. Use what ever works the best and is most comfortable for you Knee exercises include:  °• Leg Lifts - While your knee is still immobilized in a splint or cast, you can do straight leg raises. Lift the leg to 60 degrees, hold for 3 sec, and slowly lower the leg. Repeat 10-20 times 2-3 times daily. Perform this exercise against resistance later as your knee gets better.  °• Quad and Hamstring Sets - Tighten up the muscle on the front of the thigh (Quad) and hold for 5-10 sec. Repeat this 10-20 times hourly. Hamstring sets are done by pushing the foot backward against an  object and holding for 5-10 sec. Repeat as with quad sets.  °· Leg Slides: Lying on your back, slowly slide your foot toward your buttocks, bending your knee up off the floor (only go as far as is comfortable). Then slowly slide your foot back down until your leg is flat on the floor again. °· Angel Wings: Lying on your back spread your legs to the side as far apart as you can without causing discomfort.  °A rehabilitation program following serious knee injuries can speed recovery and prevent re-injury in the future due to weakened muscles. Contact your doctor or a physical therapist for more information on knee rehabilitation.  ° °IF YOU ARE TRANSFERRED TO A SKILLED REHAB FACILITY °If the patient is transferred to a skilled rehab facility following release from the hospital, a list of the current medications will be sent to the facility for the patient to continue.  When discharged from the skilled rehab facility, please have the facility set up the patient's Home Health Physical Therapy prior to being released. Also, the skilled facility will be responsible for providing the patient with their medications at time of release from the facility to include their pain medication, the muscle relaxants, and their blood thinner medication. If the patient is still at the rehab facility at time of the two week follow up appointment, the skilled rehab facility will also need to assist the patient in arranging follow up appointment in our office and any transportation needs. ° °MAKE SURE YOU:  °• Understand these instructions.  °• Get help right away if you are not doing well or get worse.  ° ° °Pick up stool softner and laxative for home use following surgery while on pain medications. °Do not submerge incision under water. °Please use good hand washing techniques while changing dressing each day. °May shower starting three days after surgery. °Please use a clean towel to pat the incision dry following showers. °Continue to  use ice for pain and swelling after surgery. °Do not use any lotions or creams on the incision until instructed by your surgeon. ° °

## 2018-03-10 LAB — CBC
HCT: 34.3 % — ABNORMAL LOW (ref 36.0–46.0)
Hemoglobin: 10.7 g/dL — ABNORMAL LOW (ref 12.0–15.0)
MCH: 28.6 pg (ref 26.0–34.0)
MCHC: 31.2 g/dL (ref 30.0–36.0)
MCV: 91.7 fL (ref 80.0–100.0)
Platelets: 272 10*3/uL (ref 150–400)
RBC: 3.74 MIL/uL — ABNORMAL LOW (ref 3.87–5.11)
RDW: 12.6 % (ref 11.5–15.5)
WBC: 12.9 10*3/uL — ABNORMAL HIGH (ref 4.0–10.5)
nRBC: 0 % (ref 0.0–0.2)

## 2018-03-10 LAB — BASIC METABOLIC PANEL
Anion gap: 9 (ref 5–15)
BUN: 10 mg/dL (ref 8–23)
CHLORIDE: 98 mmol/L (ref 98–111)
CO2: 26 mmol/L (ref 22–32)
CREATININE: 0.66 mg/dL (ref 0.44–1.00)
Calcium: 8.5 mg/dL — ABNORMAL LOW (ref 8.9–10.3)
GFR calc Af Amer: >60 mL/min (ref >60)
GFR calc non Af Amer: >60 mL/min (ref >60)
Glucose, Bld: 174 mg/dL — ABNORMAL HIGH (ref 70–99)
Potassium: 4.6 mmol/L (ref 3.5–5.1)
Sodium: 133 mmol/L — ABNORMAL LOW (ref 135–145)

## 2018-03-10 LAB — GLUCOSE, CAPILLARY
Glucose-Capillary: 112 mg/dL — ABNORMAL HIGH (ref 70–99)
Glucose-Capillary: 235 mg/dL — ABNORMAL HIGH (ref 70–99)
Glucose-Capillary: 220 mg/dL — ABNORMAL HIGH (ref 70–99)
Glucose-Capillary: 210 mg/dL — ABNORMAL HIGH (ref 70–99)

## 2018-03-10 MED ORDER — HYDROMORPHONE HCL 4 MG PO TABS: 4.0000 mg | ORAL_TABLET | Freq: Four times a day (QID) | ORAL | 0 refills | Status: DC | PRN

## 2018-03-10 MED ORDER — METHOCARBAMOL 500 MG PO TABS: 500.0000 mg | ORAL_TABLET | Freq: Four times a day (QID) | ORAL | 0 refills | Status: DC | PRN

## 2018-03-10 MED ORDER — HYDROMORPHONE HCL 2 MG PO TABS: 8.0000 mg | ORAL_TABLET | ORAL | Status: DC | PRN

## 2018-03-10 MED ORDER — HYDROMORPHONE HCL 2 MG PO TABS
4.0000 mg | ORAL_TABLET | ORAL | Status: DC | PRN
  Administered 2018-03-10 – 2018-03-11 (×6): 4 mg via ORAL
  Filled 2018-03-10 (×6): qty 2

## 2018-03-10 MED ORDER — ASPIRIN 325 MG PO TBEC: 325.0000 mg | DELAYED_RELEASE_TABLET | Freq: Two times a day (BID) | ORAL | 0 refills | Status: AC

## 2018-03-10 MED ORDER — HYDROMORPHONE BOLUS VIA INFUSION
1.0000 mg | INTRAVENOUS | Status: DC | PRN
  Filled 2018-03-10: qty 1

## 2018-03-10 NOTE — Plan of Care (Signed)
  Problem: Clinical Measurements: Goal: Ability to maintain clinical measurements within normal limits will improve Outcome: Progressing Goal: Will remain free from infection Outcome: Progressing Goal: Diagnostic test results will improve Outcome: Progressing Goal: Respiratory complications will improve Outcome: Progressing Goal: Cardiovascular complication will be avoided Outcome: Progressing   Problem: Pain Managment: Goal: General experience of comfort will improve Outcome: Progressing   

## 2018-03-10 NOTE — Progress Notes (Signed)
Physical Therapy Treatment Patient Details Name: [name] MRN: [id number] DOB: 14-Mar-1946 Today's Date: 03/10/2018    History of Present Illness 72 yo female s/p R TK revision on 03/09/18. PMH includes atherosclerosis, DMII, depression, GERD, peripheral neuropathy, lumbosacral DDD, fibromyalgia, HTN, osteopenia, OA, bilateral TKRs.     PT Comments    Progressing but with c/o fatigue and wanting to go back to bed; will see again this pm, likely ready for d/c tomorrow   Follow Up Recommendations  Follow surgeon's recommendation for DC plan and follow-up therapies     Equipment Recommendations  None recommended by PT    Recommendations for Other Services       Precautions / Restrictions Precautions Precautions: Fall;Knee Required Braces or Orthoses: Knee Immobilizer - Right Knee Immobilizer - Right: On when out of bed or walking;Discontinue once straight leg raise with < 10 degree lag Restrictions Weight Bearing Restrictions: No Other Position/Activity Restrictions: WBAT     Mobility  Bed Mobility Overal bed mobility: Needs Assistance Bed Mobility: Supine to Sit;Sit to Supine     Supine to sit: Min guard Sit to supine: Min assist   General bed mobility comments: incr time, assist with RLE in both directions  Transfers Overall transfer level: Needs assistance Equipment used: Rolling walker (2 wheeled) Transfers: Sit to/from Stand Sit to Stand: Min guard         General transfer comment: cues for hand placement and RLE position  Ambulation/Gait Ambulation/Gait assistance: Min assist;Min guard Gait Distance (Feet): 55 Feet(15') Assistive device: Rolling walker (2 wheeled) Gait Pattern/deviations: Step-to pattern;Decreased stride length;Antalgic;Decreased weight shift to right     General Gait Details: verbal cues for sequence, RW position   Stairs             Wheelchair Mobility    Modified Rankin (Stroke Patients Only)       Balance                                             Cognition Arousal/Alertness: Awake/alert Behavior During Therapy: WFL for tasks assessed/performed Overall Cognitive Status: Within Functional Limits for tasks assessed                                        Exercises Total Joint Exercises Ankle Circles/Pumps: AROM;Both;10 reps Quad Sets: AROM;Both;10 reps Heel Slides: AAROM;Right;10 reps Straight Leg Raises: AROM;Right;10 reps    General Comments        Pertinent Vitals/Pain Pain Assessment: 0-10 Pain Score: 6  Pain Location: R knee  Pain Descriptors / Indicators: Sore;Aching Pain Intervention(s): Limited activity within patient's tolerance;Monitored during session    Home Living                      Prior Function            PT Goals (current goals can now be found in the care plan section) Acute Rehab PT Goals Patient Stated Goal: none stated  PT Goal Formulation: With patient Time For Goal Achievement: 03/16/18 Potential to Achieve Goals: Good Progress towards PT goals: Progressing toward goals    Frequency    7X/week      PT Plan Current plan remains appropriate    Co-evaluation  AM-PAC PT "6 Clicks" Mobility   Outcome Measure  Help needed turning from your back to your side while in a flat bed without using bedrails?: A Little Help needed moving from lying on your back to sitting on the side of a flat bed without using bedrails?: A Little Help needed moving to and from a bed to a chair (including a wheelchair)?: A Little Help needed standing up from a chair using your arms (e.g., wheelchair or bedside chair)?: A Little Help needed to walk in hospital room?: A Little Help needed climbing 3-5 steps with a railing? : A Little 6 Click Score: 18    End of Session Equipment Utilized During Treatment: Gait belt;Right knee immobilizer Activity Tolerance: Patient tolerated treatment well Patient left: in  bed;with call bell/phone within reach;with bed alarm set Nurse Communication: Mobility status PT Visit Diagnosis: Other abnormalities of gait and mobility (R26.89);Difficulty in walking, not elsewhere classified (R26.2)     Time: 1027-1050 PT Time Calculation (min) (ACUTE ONLY): 23 min  Charges:  $Gait Training: 8-22 mins $Therapeutic Exercise: 8-22 mins                     Kenyon Ana, PT  Pager: [phone] Acute Rehab Dept [facility]): [phone]   03/10/2018    [facility] 03/10/2018, 11:44 AM

## 2018-03-10 NOTE — Progress Notes (Signed)
   Subjective: 1 Day Post-Op Procedure(s) (LRB): RIGHT TOTAL KNEE REVISION (Right) Patient reports pain as mild and moderate.   Patient seen in rounds by Dr. Wynelle Link. Pain medication changed to dilaudid due to persistent pain. No acute events overnight. Foley catheter removed.  Patient is well, but has had some minor complaints of pain in the right knee, requiring pain medications We will start therapy today.   Objective: Vital signs in last 24 hours: Temp:  [97.6 F (36.4 C)-98.7 F (37.1 C)] 98.5 F (36.9 C) (12/05 0554) Pulse Rate:  [65-94] 91 (12/05 0554) Resp:  [1-29] 16 (12/05 0554) BP: (122-148)/(66-91) 128/70 (12/05 0554) SpO2:  [94 %-100 %] 97 % (12/05 0554) Weight:  [90.7 kg] 90.7 kg (12/04 1120)  Intake/Output from previous day:  Intake/Output Summary (Last 24 hours) at 03/10/2018 0757 Last data filed at 03/10/2018 0600 Gross per 24 hour  Intake 3944.37 ml  Output 2645 ml  Net 1299.37 ml     Intake/Output this shift: No intake/output data recorded.  Labs: Recent Labs    03/10/18 0540  HGB 10.7*   Recent Labs    03/10/18 0540  WBC 12.9*  RBC 3.74*  HCT 34.3*  PLT 272   Recent Labs    03/10/18 0540  NA 133*  K 4.6  CL 98  CO2 26  BUN 10  CREATININE 0.66  GLUCOSE 174*  CALCIUM 8.5*   No results for input(s): LABPT, INR in the last 72 hours.  Exam: General - Patient is Alert and Oriented Extremity - Sensation intact distally Intact pulses distally Dorsiflexion/Plantar flexion intact Dressing - dressing C/D/I Motor Function - intact, moving foot and toes well on exam.   Past Medical History:  Diagnosis Date  . Arthritis    [place]   . Colon polyps   . Depression   . Diabetes mellitus, type 2 ([place]) 06/2010  . Diverticulosis   . Esophageal stricture   . Fibromyalgia    [place]   . GERD (gastroesophageal reflux disease)   . Hemorrhoids   . Hiatal hernia   . Hyperlipidemia   . Hypertension   . Menopause   . Peripheral  vascular insufficiency ([place])   . Vitamin D deficiency     Assessment/Plan: 1 Day Post-Op Procedure(s) (LRB): RIGHT TOTAL KNEE REVISION (Right) Principal Problem:   Failed total knee arthroplasty ([place])  Estimated body mass index is 32.28 kg/m as calculated from the following:   Height as of this encounter: 5\' 6"  (1.676 m).   Weight as of this encounter: 90.7 kg. Advance diet Up with therapy    DVT Prophylaxis - Aspirin Weight bearing as tolerated. D/C O2 and pulse ox and try on room air. Hemovac pulled without difficulty, will begin therapy today.  Plan is to go Home after hospital stay. Patient is complaining of moderate pain in the right knee. Pain medication switched from oxycodone and morphine to dilaudid. We will see if this adjustment improves pain control. Plan for discharge tomorrow as long as pain is well managed and meeting goals.   Griffith Citron, PA-C Orthopedic Surgery 03/10/2018, 7:57 AM

## 2018-03-10 NOTE — Plan of Care (Signed)
Plan of care continues

## 2018-03-10 NOTE — Care Management Note (Signed)
Case Management Note  Patient Details  Name: [name] MRN: [id number] Date of Birth: 08/12/1945  Subjective/Objective:   Spoke with patient at bedside. Confirmed plan for OP PT, already arranged. Has RW and 3n1. [phone]                 Action/Plan:   Expected Discharge Date:  03/10/18               Expected Discharge Plan:  OP Rehab  In-House Referral:  NA  Discharge planning Services  CM Consult  Post Acute Care Choice:  NA Choice offered to:  Patient  DME Arranged:  N/A DME Agency:  NA  HH Arranged:  NA HH Agency:  NA  Status of Service:  Completed, signed off  If discussed at [place] of Stay Meetings, dates discussed:    Additional Comments:  Guadalupe Maple, RN 03/10/2018, 11:28 AM

## 2018-03-10 NOTE — Progress Notes (Signed)
CBG at 2200 is 108 mg/dl.

## 2018-03-10 NOTE — Progress Notes (Signed)
03/10/18 1500  PT Visit Information  Last PT Received On 03/10/18--pt is progressing well, incr gait distance tolerance today; will see next day for further work on HEP/mobility stairs  Assistance Needed +1  History of Present Illness 72 yo female s/p R TK revision on 03/09/18. PMH includes atherosclerosis, DMII, depression, GERD, peripheral neuropathy, lumbosacral DDD, fibromyalgia, HTN, osteopenia, OA, bilateral TKRs.   Subjective Data  Patient Stated Goal none stated   Precautions  Precautions Fall;Knee  Required Braces or Orthoses Knee Immobilizer - Right  Knee Immobilizer - Right On when out of bed or walking;Discontinue once straight leg raise with < 10 degree lag  Restrictions  Other Position/Activity Restrictions WBAT   Pain Assessment  Pain Assessment 0-10  Pain Score 5  Pain Location R knee   Pain Descriptors / Indicators Sore;Aching  Pain Intervention(s) Limited activity within patient's tolerance;Monitored during session  Cognition  Arousal/Alertness Awake/alert  Behavior During Therapy WFL for tasks assessed/performed  Overall Cognitive Status Within Functional Limits for tasks assessed  Bed Mobility  Overal bed mobility Needs Assistance  Bed Mobility Supine to Sit;Sit to Supine  Supine to sit Min guard  Sit to supine Min assist  General bed mobility comments incr time, assist with RLE in both directions  Transfers  Overall transfer level Needs assistance  Equipment used Rolling walker (2 wheeled)  Transfers Sit to/from Stand  Sit to Stand Min guard  General transfer comment cues for hand placement and RLE position  Ambulation/Gait  Ambulation/Gait assistance Min assist;Min guard  Gait Distance (Feet) 100 Feet  Assistive device Rolling walker (2 wheeled)  Gait Pattern/deviations Step-to pattern;Decreased stride length;Antalgic;Decreased weight shift to right  General Gait Details verbal cues for sequence, RW position  Total Joint Exercises  Ankle Circles/Pumps  AROM;Both;10 reps  Quad Sets AROM;Both;10 reps  Heel Slides AAROM;Right;10 reps  PT - End of Session  Equipment Utilized During Treatment Gait belt;Right knee immobilizer  Activity Tolerance Patient tolerated treatment well  Patient left in bed;with call bell/phone within reach;with bed alarm set  Nurse Communication Mobility status   PT - Assessment/Plan  PT Plan Current plan remains appropriate  PT Visit Diagnosis Other abnormalities of gait and mobility (R26.89);Difficulty in walking, not elsewhere classified (R26.2)  PT Frequency (ACUTE ONLY) 7X/week  Follow Up Recommendations Follow surgeon's recommendation for DC plan and follow-up therapies  PT equipment None recommended by PT  AM-PAC PT "6 Clicks" Mobility Outcome Measure (Version 2)  Help needed turning from your back to your side while in a flat bed without using bedrails? 3  Help needed moving from lying on your back to sitting on the side of a flat bed without using bedrails? 3  Help needed moving to and from a bed to a chair (including a wheelchair)? 3  Help needed standing up from a chair using your arms (e.g., wheelchair or bedside chair)? 3  Help needed to walk in hospital room? 3  Help needed climbing 3-5 steps with a railing?  3  6 Click Score 18  Consider Recommendation of Discharge To: Home with [facility]  PT Goal Progression  Progress towards PT goals Progressing toward goals  Acute Rehab PT Goals  PT Goal Formulation With patient  Time For Goal Achievement 03/16/18  Potential to Achieve Goals Good  PT Time Calculation  PT Start Time (ACUTE ONLY) 1435  PT Stop Time (ACUTE ONLY) 1450  PT Time Calculation (min) (ACUTE ONLY) 15 min  PT General Charges  $$ ACUTE PT VISIT 1  Visit  PT Treatments  $Therapeutic Exercise 8-22 mins

## 2018-03-11 ENCOUNTER — Encounter (HOSPITAL_COMMUNITY): Payer: Self-pay | Admitting: Orthopedic Surgery

## 2018-03-11 LAB — CBC
HCT: 32.7 % — ABNORMAL LOW (ref 36.0–46.0)
Hemoglobin: 10.3 g/dL — ABNORMAL LOW (ref 12.0–15.0)
MCH: 29.2 pg (ref 26.0–34.0)
MCHC: 31.5 g/dL (ref 30.0–36.0)
MCV: 92.6 fL (ref 80.0–100.0)
Platelets: 263 10*3/uL (ref 150–400)
RBC: 3.53 MIL/uL — ABNORMAL LOW (ref 3.87–5.11)
RDW: 13 % (ref 11.5–15.5)
WBC: 12.7 10*3/uL — AB (ref 4.0–10.5)
nRBC: 0 % (ref 0.0–0.2)

## 2018-03-11 LAB — BASIC METABOLIC PANEL
Anion gap: 8 (ref 5–15)
BUN: 11 mg/dL (ref 8–23)
CO2: 28 mmol/L (ref 22–32)
Calcium: 8.4 mg/dL — ABNORMAL LOW (ref 8.9–10.3)
Chloride: 100 mmol/L (ref 98–111)
Creatinine, Ser: 0.66 mg/dL (ref 0.44–1.00)
GFR calc non Af Amer: >60 mL/min (ref >60)
Glucose, Bld: 100 mg/dL — ABNORMAL HIGH (ref 70–99)
Potassium: 4 mmol/L (ref 3.5–5.1)
Sodium: 136 mmol/L (ref 135–145)

## 2018-03-11 LAB — GLUCOSE, CAPILLARY: Glucose-Capillary: 117 mg/dL — ABNORMAL HIGH (ref 70–99)

## 2018-03-11 NOTE — Progress Notes (Signed)
Physical Therapy Treatment Patient Details Name: [name] MRN: [id number] DOB: [birth year] Today's Date: 03/11/2018    History of Present Illness 72 yo female s/p R TK revision on 03/09/18. PMH includes atherosclerosis, DMII, depression, GERD, peripheral neuropathy, lumbosacral DDD, fibromyalgia, HTN, osteopenia, OA, bilateral TKRs.     PT Comments    Pt progressing very well; completed stairs/gait/HEP, all concerns addressed regardign mobility, ready for d/c from PT standpoint  Follow Up Recommendations  Follow surgeon's recommendation for DC plan and follow-up therapies     Equipment Recommendations  None recommended by PT    Recommendations for Other Services       Precautions / Restrictions Precautions Precautions: Fall;Knee Precaution Comments: d/c IND SLRs Required Braces or Orthoses: Knee Immobilizer - Right Restrictions Weight Bearing Restrictions: No Other Position/Activity Restrictions: WBAT     Mobility  Bed Mobility Overal bed mobility: Needs Assistance Bed Mobility: Supine to Sit     Supine to sit: Supervision;Modified independent (Device/Increase time)     General bed mobility comments: for safety  Transfers Overall transfer level: Needs assistance Equipment used: Rolling walker (2 wheeled) Transfers: Sit to/from Stand Sit to Stand: Supervision         General transfer comment: cues for hand placement and RLE position  Ambulation/Gait Ambulation/Gait assistance: Supervision Gait Distance (Feet): 200 Feet Assistive device: Rolling walker (2 wheeled) Gait Pattern/deviations: Step-to pattern;Decreased stride length;Antalgic;Decreased weight shift to right     General Gait Details: verbal cues for sequence, RW position, gait progression   Stairs Stairs: Yes Stairs assistance: Min guard;Min assist Stair Management: No rails;Step to pattern;Backwards;With walker Number of Stairs: 2 General stair comments: cues for technique and sequence,  also reviewed with pt husband verbally, pt adn husband have good understanding of techniques   Wheelchair Mobility    Modified Rankin (Stroke Patients Only)       Balance                                            Cognition Arousal/Alertness: Awake/alert Behavior During Therapy: WFL for tasks assessed/performed Overall Cognitive Status: Within Functional Limits for tasks assessed                                 General Comments: educated on useof ice at home      Exercises Total Joint Exercises Ankle Circles/Pumps: AROM;Both;10 reps Quad Sets: AROM;Both;10 reps Heel Slides: AROM;AAROM;Right;20 reps(using gait belt to self assist) Goniometric ROM: grossly -8* to 75* AAROM right knee flexion    General Comments        Pertinent Vitals/Pain Pain Assessment: 0-10 Pain Score: 4  Pain Location: R knee  Pain Descriptors / Indicators: Sore;Aching Pain Intervention(s): Limited activity within patient's tolerance;Monitored during session;Premedicated before session;Repositioned    Home Living                      Prior Function            PT Goals (current goals can now be found in the care plan section) Acute Rehab PT Goals Patient Stated Goal: none stated  PT Goal Formulation: With patient Time For Goal Achievement: 03/16/18 Potential to Achieve Goals: Good Progress towards PT goals: Progressing toward goals    Frequency    7X/week      PT  Plan Current plan remains appropriate    Co-evaluation              AM-PAC PT "6 Clicks" Mobility   Outcome Measure  Help needed turning from your back to your side while in a flat bed without using bedrails?: A Little Help needed moving from lying on your back to sitting on the side of a flat bed without using bedrails?: A Little Help needed moving to and from a bed to a chair (including a wheelchair)?: A Little Help needed standing up from a chair using your arms  (e.g., wheelchair or bedside chair)?: A Little Help needed to walk in hospital room?: A Little Help needed climbing 3-5 steps with a railing? : A Little 6 Click Score: 18    End of Session   Activity Tolerance: Patient tolerated treatment well Patient left: in chair;with call bell/phone within reach;with family/visitor present Nurse Communication: Mobility status;Other (comment)(ready fo rd/c) PT Visit Diagnosis: Other abnormalities of gait and mobility (R26.89);Difficulty in walking, not elsewhere classified (R26.2)     Time: 1050-1109 PT Time Calculation (min) (ACUTE ONLY): 19 min  Charges:  $Gait Training: 8-22 mins                     Kenyon Ana, PT  Pager: [phone] Acute Rehab Dept [facility]): [phone]   03/11/2018    [facility] 03/11/2018, 11:21 AM

## 2018-03-11 NOTE — Progress Notes (Signed)
   Subjective: 2 Days Post-Op Procedure(s) (LRB): RIGHT TOTAL KNEE REVISION (Right) Patient reports pain as mild.   Patient seen in rounds with Dr. Wynelle Link. Patient is well, and has had no acute complaints or problems other than discomfort in the right knee. No issues overnight. No SOB or chest pain. Voiding well. Positive flatus.  Plan is to go Home after hospital stay.  Objective: Vital signs in last 24 hours: Temp:  [97.5 F (36.4 C)-97.6 F (36.4 C)] 97.6 F (36.4 C) (12/06 0523) Pulse Rate:  [72-89] 72 (12/06 0523) Resp:  [16-20] 20 (12/06 0523) BP: (124-156)/(66-78) 124/66 (12/06 0523) SpO2:  [93 %-96 %] 94 % (12/06 0523)  Intake/Output from previous day:  Intake/Output Summary (Last 24 hours) at 03/11/2018 0714 Last data filed at 03/11/2018 0551 Gross per 24 hour  Intake 1298.52 ml  Output 2450 ml  Net -1151.48 ml     Labs: Recent Labs    03/10/18 0540  HGB 10.7*   Recent Labs    03/10/18 0540  WBC 12.9*  RBC 3.74*  HCT 34.3*  PLT 272   Recent Labs    03/10/18 0540  NA 133*  K 4.6  CL 98  CO2 26  BUN 10  CREATININE 0.66  GLUCOSE 174*  CALCIUM 8.5*    EXAM General - Patient is Alert and Oriented Extremity - Neurologically intact Intact pulses distally Dorsiflexion/Plantar flexion intact No cellulitis present Compartment soft Dressing/Incision - clean, dry, no drainage Motor Function - intact, moving foot and toes well on exam.   Past Medical History:  Diagnosis Date  . Arthritis    [place]   . Colon polyps   . Depression   . Diabetes mellitus, type 2 ([place]) 06/2010  . Diverticulosis   . Esophageal stricture   . Fibromyalgia    [place]   . GERD (gastroesophageal reflux disease)   . Hemorrhoids   . Hiatal hernia   . Hyperlipidemia   . Hypertension   . Menopause   . Peripheral vascular insufficiency ([place])   . Vitamin D deficiency     Assessment/Plan: 2 Days Post-Op Procedure(s) (LRB): RIGHT TOTAL KNEE REVISION  (Right) Principal Problem:   Failed total knee arthroplasty ([place])  Estimated body mass index is 32.28 kg/m as calculated from the following:   Height as of this encounter: 5\' 6"  (1.676 m).   Weight as of this encounter: 90.7 kg. Advance diet Up with therapy Discharge home with outpatient therapy  DVT Prophylaxis - Aspirin Weight-Bearing as tolerated   Continue with therapy today. Plan for DC home today pending progress. Follow up in office in 2 weeks.   Ardeen Jourdain, PA-C Orthopaedic Surgery 03/11/2018, 7:14 AM

## 2018-03-11 NOTE — Plan of Care (Signed)
Pt alert and oriented, resting with husband at the bedside. Pain well controlled with PO pain meds. Plan to work with therapy and d/c home per MD order.

## 2018-03-14 ENCOUNTER — Ambulatory Visit: Payer: Medicare Other | Attending: Orthopedic Surgery | Admitting: Physical Therapy

## 2018-03-14 DIAGNOSIS — M25562 Pain in left knee: Secondary | ICD-10-CM | POA: Diagnosis not present

## 2018-03-14 DIAGNOSIS — G8929 Other chronic pain: Secondary | ICD-10-CM | POA: Diagnosis not present

## 2018-03-14 DIAGNOSIS — M25561 Pain in right knee: Secondary | ICD-10-CM | POA: Insufficient documentation

## 2018-03-14 DIAGNOSIS — R600 Localized edema: Secondary | ICD-10-CM | POA: Diagnosis not present

## 2018-03-14 DIAGNOSIS — M25661 Stiffness of right knee, not elsewhere classified: Secondary | ICD-10-CM | POA: Diagnosis not present

## 2018-03-14 NOTE — Discharge Summary (Signed)
Physician Discharge Summary   [name] MRN: [id number] DOB/AGE: 72-10-1945 72 y.o.  Admit date: 03/09/2018 Discharge date: 03/11/2018  Primary Diagnosis: Failed right total knee   Admission Diagnoses:  Past Medical History:  Diagnosis Date  . Arthritis    [place]   . Colon polyps   . Depression   . Diabetes mellitus, type 2 ([place]) 06/2010  . Diverticulosis   . Esophageal stricture   . Fibromyalgia    [place]   . GERD (gastroesophageal reflux disease)   . Hemorrhoids   . Hiatal hernia   . Hyperlipidemia   . Hypertension   . Menopause   . Peripheral vascular insufficiency ([place])   . Vitamin D deficiency    Discharge Diagnoses:   Principal Problem:   Failed total knee arthroplasty ([place])  Estimated body mass index is 32.28 kg/m as calculated from the following:   Height as of this encounter: _0  (1.676 m).   Weight as of this encounter: 90.7 kg.  Procedure:  Procedure(s) (LRB): RIGHT TOTAL KNEE REVISION (Right)   Consults: None  HPI: [name], 72 y.o. female, has a history of pain and functional disability in the right knee(s) due to failed previous arthroplasty and patient has failed non-surgical conservative treatments for greater than 12 weeks to include activity modification and use of a knee brace. The indications for the revision of the total knee arthroplasty are loosening of one or more components. Onset of symptoms was abrupt starting 6 months ago with stable course since that time.  Prior procedures on the right knee(s) include arthroplasty. Patient currently rates pain in the right knee(s) at 6 out of 10 with activity. There is worsening of pain with activity and weight bearing, pain that interferes with activities of daily living and instability.  Patient has evidence of prosthetic loosening by imaging studies. This condition presents safety issues increasing the risk of falls. Bone scan of the right knee shows increased uptake around the  tibial component. There is no current active infection.  Laboratory Data: Admission on 03/09/2018, Discharged on 03/11/2018  Component Date Value Ref Range Status  . Glucose-Capillary 03/09/2018 108* 70 - 99 mg/dL Final  . Glucose-Capillary 03/09/2018 89  70 - 99 mg/dL Final  . WBC 03/10/2018 12.9* 4.0 - 10.5 K/uL Final  . RBC 03/10/2018 3.74* 3.87 - 5.11 MIL/uL Final  . Hemoglobin 03/10/2018 10.7* 12.0 - 15.0 g/dL Final  . HCT 03/10/2018 34.3* 36.0 - 46.0 % Final  . MCV 03/10/2018 91.7  80.0 - 100.0 fL Final  . MCH 03/10/2018 28.6  26.0 - 34.0 pg Final  . MCHC 03/10/2018 31.2  30.0 - 36.0 g/dL Final  . RDW 03/10/2018 12.6  11.5 - 15.5 % Final  . Platelets 03/10/2018 272  150 - 400 K/uL Final  . nRBC 03/10/2018 0.0  0.0 - 0.2 % Final   Performed at [facility], [place] [address] [place], [place] [address]  . Sodium 03/10/2018 133* 135 - 145 mmol/L Final  . Potassium 03/10/2018 4.6  3.5 - 5.1 mmol/L Final  . Chloride 03/10/2018 98  98 - 111 mmol/L Final  . CO2 03/10/2018 26  22 - 32 mmol/L Final  . Glucose, Bld 03/10/2018 174* 70 - 99 mg/dL Final  . BUN 03/10/2018 10  8 - 23 mg/dL Final  . Creatinine, Ser 03/10/2018 0.66  0.44 - 1.00 mg/dL Final  . Calcium 03/10/2018 8.5* 8.9 - 10.3 mg/dL Final  . GFR calc non Af  Amer 03/10/2018 >60  >60 mL/min Final  . GFR calc Af Amer 03/10/2018 >60  >60 mL/min Final  . Anion gap 03/10/2018 9  5 - 15 Final   Performed at [facility], [place] [address] [place], [place] [address]  . Glucose-Capillary 03/09/2018 214* 70 - 99 mg/dL Final  . Glucose-Capillary 03/10/2018 112* 70 - 99 mg/dL Final  . Glucose-Capillary 03/10/2018 235* 70 - 99 mg/dL Final  . Glucose-Capillary 03/10/2018 220* 70 - 99 mg/dL Final  . WBC 03/11/2018 12.7* 4.0 - 10.5 K/uL Final  . RBC 03/11/2018 3.53* 3.87 - 5.11 MIL/uL Final  . Hemoglobin 03/11/2018 10.3* 12.0 - 15.0 g/dL Final  . HCT 03/11/2018 32.7* 36.0 - 46.0 % Final  . MCV 03/11/2018  92.6  80.0 - 100.0 fL Final  . MCH 03/11/2018 29.2  26.0 - 34.0 pg Final  . MCHC 03/11/2018 31.5  30.0 - 36.0 g/dL Final  . RDW 03/11/2018 13.0  11.5 - 15.5 % Final  . Platelets 03/11/2018 263  150 - 400 K/uL Final  . nRBC 03/11/2018 0.0  0.0 - 0.2 % Final   Performed at [facility], [place] [address] [place], [place] [address]  . Sodium 03/11/2018 136  135 - 145 mmol/L Final  . Potassium 03/11/2018 4.0  3.5 - 5.1 mmol/L Final  . Chloride 03/11/2018 100  98 - 111 mmol/L Final  . CO2 03/11/2018 28  22 - 32 mmol/L Final  . Glucose, Bld 03/11/2018 100* 70 - 99 mg/dL Final  . BUN 03/11/2018 11  8 - 23 mg/dL Final  . Creatinine, Ser 03/11/2018 0.66  0.44 - 1.00 mg/dL Final  . Calcium 03/11/2018 8.4* 8.9 - 10.3 mg/dL Final  . GFR calc non Af Amer 03/11/2018 >60  >60 mL/min Final  . GFR calc Af Amer 03/11/2018 >60  >60 mL/min Final  . Anion gap 03/11/2018 8  5 - 15 Final   Performed at [facility], [place] [address], [place], [place] [address]  . Glucose-Capillary 03/10/2018 210* 70 - 99 mg/dL Final  . Glucose-Capillary 03/11/2018 117* 70 - 99 mg/dL Final  Hospital Outpatient Visit on 03/01/2018  Component Date Value Ref Range Status  . MRSA, PCR 03/01/2018 NEGATIVE  NEGATIVE Final  . Staphylococcus aureus 03/01/2018 NEGATIVE  NEGATIVE Final   Comment: (NOTE) The Xpert SA Assay (FDA approved for NASAL specimens in patients 49 years of age and older), is one component of a comprehensive surveillance program. It is not intended to diagnose infection nor to guide or monitor treatment. Performed at [facility], [place] [address] [place], [place] [address]   . Hgb A1c MFr Bld 03/01/2018 6.9* 4.8 - 5.6 % Final   Comment: (NOTE) Pre diabetes:          5.7%-6.4% Diabetes:              >6.4% Glycemic control for   <7.0% adults with diabetes   . Mean Plasma Glucose 03/01/2018 151.33  mg/dL Final   Performed at [place] [address], [place], [place] [address]  . aPTT 03/01/2018 26  24 - 36 seconds Final   Performed at [facility], [place] [address], [place], [place] [address]  . WBC 03/01/2018 9.9  4.0 - 10.5 K/uL Final  . RBC 03/01/2018 4.08  3.87 - 5.11 MIL/uL Final  . Hemoglobin 03/01/2018 11.8* 12.0 - 15.0 g/dL Final  . HCT 03/01/2018 37.0  36.0 - 46.0 %  Final  . MCV 03/01/2018 90.7  80.0 - 100.0 fL Final  . MCH 03/01/2018 28.9  26.0 - 34.0 pg Final  . MCHC 03/01/2018 31.9  30.0 - 36.0 g/dL Final  . RDW 03/01/2018 12.7  11.5 - 15.5 % Final  . Platelets 03/01/2018 310  150 - 400 K/uL Final  . nRBC 03/01/2018 0.0  0.0 - 0.2 % Final   Performed at [facility], [place] [address] [place], [place] [address]  . Sodium 03/01/2018 130* 135 - 145 mmol/L Final  . Potassium 03/01/2018 4.1  3.5 - 5.1 mmol/L Final  . Chloride 03/01/2018 94* 98 - 111 mmol/L Final  . CO2 03/01/2018 27  22 - 32 mmol/L Final  . Glucose, Bld 03/01/2018 105* 70 - 99 mg/dL Final  . BUN 03/01/2018 13  8 - 23 mg/dL Final  . Creatinine, Ser 03/01/2018 0.77  0.44 - 1.00 mg/dL Final  . Calcium 03/01/2018 9.0  8.9 - 10.3 mg/dL Final  . Total Protein 03/01/2018 6.8  6.5 - 8.1 g/dL Final  . Albumin 03/01/2018 3.4* 3.5 - 5.0 g/dL Final  . AST 03/01/2018 26  15 - 41 U/L Final  . ALT 03/01/2018 22  0 - 44 U/L Final  . Alkaline Phosphatase 03/01/2018 69  38 - 126 U/L Final  . Total Bilirubin 03/01/2018 0.9  0.3 - 1.2 mg/dL Final  . GFR calc non Af Amer 03/01/2018 >60  >60 mL/min Final  . GFR calc Af Amer 03/01/2018 >60  >60 mL/min Final  . Anion gap 03/01/2018 9  5 - 15 Final   Performed at [place] [address], [place], [place] [address]  . Prothrombin Time 03/01/2018 12.0  11.4 - 15.2 seconds Final  . INR 03/01/2018 0.90   Final   Performed at [facility], [place] [address], [place], [place] [address]  . ABO/RH(D) 03/01/2018 A POS   Final  . Antibody Screen 03/01/2018  NEG   Final  . Sample Expiration 03/01/2018 03/12/2018   Final  . Extend sample reason 03/01/2018    Final                   Value:NO TRANSFUSIONS OR PREGNANCY IN THE PAST 3 MONTHS Performed at [facility], [place] [address], [place], [place] [address]   . Glucose-Capillary 03/01/2018 113* 70 - 99 mg/dL Final      Hospital Course: [name] is a 72 y.o. who was admitted to [facility]. They were brought to the operating room on 03/09/2018 and underwent Procedure(s): RIGHT TOTAL KNEE REVISION.  Patient tolerated the procedure well and was later transferred to the recovery room and then to the orthopaedic floor for postoperative care.  They were given PO and IV analgesics for pain control following their surgery.  They were given 24 hours of postoperative antibiotics of  Anti-infectives (From admission, onward)   Start     Dose/Rate Route Frequency Ordered Stop   03/10/18 0000  vancomycin (VANCOCIN) IVPB 1000 mg/200 mL premix     1,000 mg 200 mL/hr over 60 Minutes Intravenous Every 12 hours 03/09/18 1709 03/10/18 0026   03/09/18 1115  vancomycin (VANCOCIN) IVPB 1000 mg/200 mL premix     1,000 mg 200 mL/hr over 60 Minutes Intravenous On call to O.R. 03/09/18 1101 03/09/18 1239     and started on DVT prophylaxis in the form of Aspirin.   PT and OT were ordered for total joint  protocol.  Discharge planning consulted to help with postop disposition and equipment needs.  Patient had a fair night on the evening of surgery.  They started to get up OOB with therapy on day one. Hemovac drain was pulled without difficulty.  Continued to work with therapy into day two.  Dressing was changed on day two and the incision was clean and dry.  The patient had progressed with therapy and meeting their goals.  Incision was healing well.  Patient was seen in rounds and was ready to go home.   Diet: Cardiac diet Activity:WBAT Follow-up:in 2 weeks Disposition - Home Discharged  Condition: stable   Discharge Instructions    Call MD / Call 911   Complete by:  As directed    If you experience chest pain or shortness of breath, CALL 911 and be transported to the hospital emergency room.  If you develope a fever above 101 F, pus (white drainage) or increased drainage or redness at the wound, or calf pain, call your surgeon's office.   Change dressing   Complete by:  As directed    Change the dressing daily with sterile 4 x 4 inch gauze dressing and apply TED hose.   Constipation Prevention   Complete by:  As directed    Drink plenty of fluids.  Prune juice may be helpful.  You may use a stool softener, such as Colace (over the counter) 100 mg twice a day.  Use MiraLax (over the counter) for constipation as needed.   Diet - low sodium heart healthy   Complete by:  As directed    Discharge instructions   Complete by:  As directed    Dr. Gaynelle Arabian Total Joint Specialist [facility] [address], [place], [place] [address] [phone]  TOTAL KNEE REPLACEMENT POSTOPERATIVE DIRECTIONS  Knee Rehabilitation, Guidelines Following Surgery  Results after knee surgery are often greatly improved when you follow the exercise, range of motion and muscle strengthening exercises prescribed by your doctor. Safety measures are also important to protect the knee from further injury. Any time any of these exercises cause you to have increased pain or swelling in your knee joint, decrease the amount until you are comfortable again and slowly increase them. If you have problems or questions, call your caregiver or physical therapist for advice.   HOME CARE INSTRUCTIONS  Remove items at home which could result in a fall. This includes throw rugs or furniture in walking pathways.  ICE to the affected knee every three hours for 30 minutes at a time and then as needed for pain and swelling.  Continue to use ice on the knee for pain and swelling from surgery. You may  notice swelling that will progress down to the foot and ankle.  This is normal after surgery.  Elevate the leg when you are not up walking on it.   Continue to use the breathing machine which will help keep your temperature down.  It is common for your temperature to cycle up and down following surgery, especially at night when you are not up moving around and exerting yourself.  The breathing machine keeps your lungs expanded and your temperature down. Do not place pillow under knee, focus on keeping the knee straight while resting   DIET You may resume your previous home diet once your are discharged from the hospital.  DRESSING / WOUND CARE / SHOWERING You may shower 3 days after surgery, but keep the wounds dry during showering.  You may use an occlusive plastic wrap (Press'n Seal for example), NO SOAKING/SUBMERGING IN THE BATHTUB.  If the bandage gets wet, change with a clean dry gauze.  If the incision gets wet, pat the wound dry with a clean towel. You may start showering once you are discharged home but do not submerge the incision under water. Just pat the incision dry and apply a dry gauze dressing on daily. Change the surgical dressing daily and reapply a dry dressing each time.  ACTIVITY Walk with your walker as instructed. Use walker as long as suggested by your caregivers. Avoid periods of inactivity such as sitting longer than an hour when not asleep. This helps prevent blood clots.  You may resume a sexual relationship in one month or when given the OK by your doctor.  You may return to work once you are cleared by your doctor.  Do not drive a car for 6 weeks or until released by you surgeon.  Do not drive while taking narcotics.  WEIGHT BEARING Weight bearing as tolerated with assist device (walker, cane, etc) as directed, use it as long as suggested by your surgeon or therapist, typically at least 4-6 weeks.  POSTOPERATIVE CONSTIPATION PROTOCOL Constipation - defined  medically as fewer than three stools per week and severe constipation as less than one stool per week.  One of the most common issues patients have following surgery is constipation.  Even if you have a regular bowel pattern at home, your normal regimen is likely to be disrupted due to multiple reasons following surgery.  Combination of anesthesia, postoperative narcotics, change in appetite and fluid intake all can affect your bowels.  In order to avoid complications following surgery, here are some recommendations in order to help you during your recovery period.  Colace (docusate) - Pick up an over-the-counter form of Colace or another stool softener and take twice a day as long as you are requiring postoperative pain medications.  Take with a full glass of water daily.  If you experience loose stools or diarrhea, hold the colace until you stool forms back up.  If your symptoms do not get better within 1 week or if they get worse, check with your doctor.  Dulcolax (bisacodyl) - Pick up over-the-counter and take as directed by the product packaging as needed to assist with the movement of your bowels.  Take with a full glass of water.  Use this product as needed if not relieved by Colace only.   MiraLax (polyethylene glycol) - Pick up over-the-counter to have on hand.  MiraLax is a solution that will increase the amount of water in your bowels to assist with bowel movements.  Take as directed and can mix with a glass of water, juice, soda, coffee, or tea.  Take if you go more than two days without a movement. Do not use MiraLax more than once per day. Call your doctor if you are still constipated or irregular after using this medication for 7 days in a row.  If you continue to have problems with postoperative constipation, please contact the office for further assistance and recommendations.  If you experience "the worst abdominal pain ever" or develop nausea or vomiting, please contact the office  immediatly for further recommendations for treatment.  ITCHING  If you experience itching with your medications, try taking only a single pain pill, or even half a pain pill at a time.  You can also use Benadryl over the counter for itching or also  to help with sleep.   TED HOSE STOCKINGS Wear the elastic stockings on both legs for three weeks following surgery during the day but you may remove then at night for sleeping.  MEDICATIONS See your medication summary on the "After Visit Summary" that the nursing staff will review with you prior to discharge.  You may have some home medications which will be placed on hold until you complete the course of blood thinner medication.  It is important for you to complete the blood thinner medication as prescribed by your surgeon.  Continue your approved medications as instructed at time of discharge.  PRECAUTIONS If you experience chest pain or shortness of breath - call 911 immediately for transfer to the hospital emergency department.  If you develop a fever greater that 101 F, purulent drainage from wound, increased redness or drainage from wound, foul odor from the wound/dressing, or calf pain - CONTACT YOUR SURGEON.                                                   FOLLOW-UP APPOINTMENTS Make sure you keep all of your appointments after your operation with your surgeon and caregivers. You should call the office at the above phone number and make an appointment for approximately two weeks after the date of your surgery or on the date instructed by your surgeon outlined in the "After Visit Summary".   RANGE OF MOTION AND STRENGTHENING EXERCISES  Rehabilitation of the knee is important following a knee injury or an operation. After just a few days of immobilization, the muscles of the thigh which control the knee become weakened and shrink (atrophy). Knee exercises are designed to build up the tone and strength of the thigh muscles and to improve knee  motion. Often times heat used for twenty to thirty minutes before working out will loosen up your tissues and help with improving the range of motion but do not use heat for the first two weeks following surgery. These exercises can be done on a training (exercise) mat, on the floor, on a table or on a bed. Use what ever works the best and is most comfortable for you Knee exercises include:  Leg Lifts - While your knee is still immobilized in a splint or cast, you can do straight leg raises. Lift the leg to 60 degrees, hold for 3 sec, and slowly lower the leg. Repeat 10-20 times 2-3 times daily. Perform this exercise against resistance later as your knee gets better.  Quad and Hamstring Sets - Tighten up the muscle on the front of the thigh (Quad) and hold for 5-10 sec. Repeat this 10-20 times hourly. Hamstring sets are done by pushing the foot backward against an object and holding for 5-10 sec. Repeat as with quad sets.  Leg Slides: Lying on your back, slowly slide your foot toward your buttocks, bending your knee up off the floor (only go as far as is comfortable). Then slowly slide your foot back down until your leg is flat on the floor again. Angel Wings: Lying on your back spread your legs to the side as far apart as you can without causing discomfort.  A rehabilitation program following serious knee injuries can speed recovery and prevent re-injury in the future due to weakened muscles. Contact your doctor or a physical therapist for more information on  knee rehabilitation.   IF YOU ARE TRANSFERRED TO A SKILLED REHAB FACILITY If the patient is transferred to a skilled rehab facility following release from the hospital, a list of the current medications will be sent to the facility for the patient to continue.  When discharged from the skilled rehab facility, please have the facility set up the patient's [place] prior to being released. Also, the skilled facility will be  responsible for providing the patient with their medications at time of release from the facility to include their pain medication, the muscle relaxants, and their blood thinner medication. If the patient is still at the rehab facility at time of the two week follow up appointment, the skilled rehab facility will also need to assist the patient in arranging follow up appointment in our office and any transportation needs.  MAKE SURE YOU:  Understand these instructions.  Get help right away if you are not doing well or get worse.    Pick up stool softner and laxative for home use following surgery while on pain medications. Do not submerge incision under water. Please use good hand washing techniques while changing dressing each day. May shower starting three days after surgery. Please use a clean towel to pat the incision dry following showers. Continue to use ice for pain and swelling after surgery. Do not use any lotions or creams on the incision until instructed by your surgeon.   Do not put a pillow under the knee. Place it under the heel.   Complete by:  As directed    Driving restrictions   Complete by:  As directed    No driving for two weeks   TED hose   Complete by:  As directed    Use stockings (TED hose) for three weeks on both leg(s).  You may remove them at night for sleeping.   Weight bearing as tolerated   Complete by:  As directed      Allergies as of 03/11/2018      Reactions   Codeine Nausea And Vomiting   Crestor [rosuvastatin Calcium] Other (See Comments)   Joints ache   Erythromycin Nausea And Vomiting   Fenofibrate Other (See Comments)   aching & edema    Lyrica [pregabalin] Other (See Comments)   Edema in feet   Penicillins Other (See Comments)   Blisters in mouth   Statins Other (See Comments)   Joints ache   Sulfa Antibiotics Nausea And Vomiting   Zocor [simvastatin] Other (See Comments)   Joints ache      Medication List    TAKE these  medications   ALPRAZolam 0.5 MG tablet Commonly known as:  XANAX Take 1-2 tabs daily as needed What changed:    how much to take  how to take this  when to take this  additional instructions   ARIPiprazole 2 MG tablet Commonly known as:  ABILIFY Take 1 tablet (2 mg total) by mouth daily. What changed:    how much to take  when to take this   aspirin 325 MG EC tablet Take 1 tablet (325 mg total) by mouth 2 (two) times daily for 19 days. Take one tablet (325 mg) Aspirin two times a day for three weeks following surgery. Then take one baby Aspirin (81 mg) once a day for three weeks. Then discontinue aspirin.   blood glucose meter kit and supplies Kit Dispense based on patient and insurance preference. Use up to two times daily as directed. (  Dx: type 2 DM - E11.9) Notes to patient:  Use as prescribed   cholecalciferol 1000 units tablet Commonly known as:  VITAMIN D Take 1,000 Units by mouth daily.   escitalopram 20 MG tablet Commonly known as:  LEXAPRO Take 1 tablet (20 mg total) by mouth daily.   esomeprazole 40 MG capsule Commonly known as:  NEXIUM TAKE  (1)  CAPSULE  TWICE DAILY. What changed:    how much to take  how to take this  when to take this  additional instructions   ezetimibe 10 MG tablet Commonly known as:  ZETIA Take 1 tablet (10 mg total) by mouth daily. What changed:  how much to take   Fish Oil 1000 MG Caps Take 2,000 mg by mouth daily.   gabapentin 600 MG tablet Commonly known as:  NEURONTIN Take 1 tablet (600 mg total) by mouth 4 (four) times daily. What changed:    how much to take  when to take this  additional instructions   glucose blood test strip CHECK BLOOD SUGAR UP TO 2 TIMES A DAY Notes to patient:  Use as prescribed   glucose monitoring kit monitoring kit Please dispense whichever glucometer patient's insurance will cover.  Use to check BG up to twice.  DX: 250.02 Notes to patient:  Use as prescribed   Blood  Glucose Monitoring Suppl Devi Blood glucose meter One Touch Brand. Use to check BS up to BID. DX 250.02 Notes to patient:  Use as prescribed   HYDROmorphone 4 MG tablet Commonly known as:  DILAUDID Take 1 tablet (4 mg total) by mouth every 6 (six) hours as needed for moderate pain.   Lancets 30G Misc Use to check BG twice daily.  Dx:  250.02 Notes to patient:  Use as prescribed   ONETOUCH DELICA LANCETS 41D Misc CHECK BLOOD SUGAR UP TO TWICE DAILY Notes to patient:  Use as prescribed   lisinopril-hydrochlorothiazide 20-25 MG tablet Commonly known as:  PRINZIDE,ZESTORETIC Take 1 tablet by mouth daily.   metFORMIN 500 MG 24 hr tablet Commonly known as:  GLUCOPHAGE-XR Take 2 tablets (1,000 mg total) by mouth daily with breakfast.   methocarbamol 500 MG tablet Commonly known as:  ROBAXIN Take 1 tablet (500 mg total) by mouth every 6 (six) hours as needed for muscle spasms.   OZEMPIC (1 MG/DOSE) 2 MG/1.5ML Sopn Generic drug:  Semaglutide (1 MG/DOSE) INJECT 1MG (0.75ML) ONCE A WEEK What changed:  See the new instructions. Notes to patient:  Take as prescribed   Pitavastatin Calcium 2 MG Tabs TAKE  (1)  TABLET  EVERY OTHER DAY. What changed:    how much to take  how to take this  when to take this  additional instructions Notes to patient:  Take as prescribed   PREMARIN vaginal cream Generic drug:  conjugated estrogens Place 1 Applicatorful vaginally at bedtime as needed. What changed:  See the new instructions.            Discharge Care Instructions  (From admission, onward)         Start     Ordered   03/10/18 0000  Weight bearing as tolerated     03/10/18 0818   03/10/18 0000  Change dressing    Comments:  Change the dressing daily with sterile 4 x 4 inch gauze dressing and apply TED hose.   03/10/18 0818         Follow-up Information    Gaynelle Arabian, MD. Schedule an appointment as  soon as possible for a visit on 03/24/2018.   Specialty:   Orthopedic Surgery Contact information: [address] [place] [address] [phone]           Signed: Ardeen Jourdain, PA-C Orthopaedic Surgery 03/14/2018, 7:08 AM

## 2018-03-14 NOTE — Therapy (Signed)
[facility] [place], [place], [address] Phone: [phone]   Fax:  [phone]  Physical Therapy Evaluation  Patient Details  Name: [name] MRN: [id number] Date of Birth: Aug 15, 1945 Referring Provider (PT): Gaynelle Arabian MD.   Encounter Date: 03/14/2018  PT End of Session - 03/14/18 1154    Visit Number  1    Number of Visits  12    Date for PT Re-Evaluation  04/11/18    Authorization Type  FOTO AT LEAST EVERY 5TH VISIT, 10TH VISIT PROGRESS NOTE AND KX MODIFIER AFTER THE 15 VISIT.    PT Start Time  1041    PT Stop Time  1128    PT Time Calculation (min)  47 min    Activity Tolerance  Patient tolerated treatment well    Behavior During Therapy  WFL for tasks assessed/performed       Past Medical History:  Diagnosis Date  . Arthritis    [place]   . Colon polyps   . Depression   . Diabetes mellitus, type 2 ([place]) 06/2010  . Diverticulosis   . Esophageal stricture   . Fibromyalgia    [place]   . GERD (gastroesophageal reflux disease)   . Hemorrhoids   . Hiatal hernia   . Hyperlipidemia   . Hypertension   . Menopause   . Peripheral vascular insufficiency ([place])   . Vitamin D deficiency     Past Surgical History:  Procedure Laterality Date  . ABDOMINAL HYSTERECTOMY     partial and then total  . ANKLE SURGERY     right  . ELBOW SURGERY     left  . JOINT REPLACEMENT Bilateral    knee right (15 years ago), left knee (10 years ago)  . REFRACTIVE SURGERY  2003  . REPLACEMENT TOTAL KNEE Bilateral   . TOTAL KNEE ARTHROPLASTY     bilateral  . TOTAL KNEE REVISION Right 03/09/2018   Procedure: RIGHT TOTAL KNEE REVISION;  Surgeon: Gaynelle Arabian, MD;  Location: [facility];  Service: Orthopedics;  Laterality: Right;  144min with abductor block  . VESICOVAGINAL FISTULA CLOSURE W/ TAH  1981    There were no vitals filed for this visit.   Subjective Assessment - 03/14/18 1225    Subjective  The patient  underwent a right knee revision on 03/09/18.  She is reporting moderate pain currently that increases with movement.  Rest decreases pain.  She is currently using a FWW for safe ambulation.    Patient is accompained by:  Family member   Husband.   Limitations  Walking    How long can you walk comfortably?  Short distances around home currently.    Patient Stated Goals  Get out of pain and walk independently.    Currently in Pain?  Yes    Pain Score  5     Pain Location  Knee    Pain Orientation  Right    Pain Descriptors / Indicators  Aching    Pain Type  Surgical pain    Pain Onset  In the past 7 days    Pain Frequency  Constant    Aggravating Factors   Movement.    Pain Relieving Factors  Rest.         [facility] PT Assessment - 03/14/18 0001      Assessment   Medical Diagnosis  Right TKA revision.    Referring Provider (PT)  Gaynelle Arabian MD.    Onset Date/Surgical Date  --  03/09/18 (surgery date)     Precautions   Precautions  --   No ultrasound.     Restrictions   Weight Bearing Restrictions  No      Balance Screen   Has the patient fallen in the past 6 months  No    Has the patient had a decrease in activity level because of a fear of falling?   Yes    Is the patient reluctant to leave their home because of a fear of falling?   No      Home Environment   Living Environment  Private residence      Prior Function   Level of Independence  Independent      Observation/Other Assessments   Observations  Steri-strips and gauze intact.    Focus on Therapeutic Outcomes (FOTO)   85% limitation.      ROM / Strength   AROM / PROM / Strength  AROM;Strength      AROM   Overall AROM Comments  -20 degrees of active right knee in supine and passive= -15 degrees with active flexion to 105 degrees.      Strength   Overall Strength Comments  Right hip strength= 3+/5, right knee= 4/5.      Palpation   Palpation comment  Diffuse pain reported over right anterior knee and pain  reported over quadriceps.      Special Tests   Other special tests  Circumferential measurement at mid-patellar region is 4 cms greater on right than left.      Ambulation/Gait   Gait Comments  Decreased stance time over right LE with a FWW.                Objective measurements completed on examination: See above findings.      [facility] Adult PT Treatment/Exercise - 03/14/18 0001      Exercises   Exercises  Knee/Hip      Knee/Hip Exercises: Aerobic   Nustep  Level 1 x 10 minutes moving forward x 1 to increase knee flexion.      Modalities   Modalities  Vasopneumatic      Vasopneumatic   Number Minutes Vasopneumatic   10 minutes    Vasopnuematic Location   --   Right knee.   Vasopneumatic Pressure  Low               PT Short Term Goals - 03/14/18 1303      PT SHORT TERM GOAL #1   Title  Ind with an initial HEP.    Time  2    Period  Weeks    Status  New      PT SHORT TERM GOAL #2   Title  Full right knee active extension.    Time  2    Period  Weeks    Status  New        PT Long Term Goals - 03/14/18 1304      PT LONG TERM GOAL #1   Title  Independent with an advanced HEP.    Time  4    Period  Weeks    Status  New      PT LONG TERM GOAL #2   Title  Increase active right knee flexion to 120 degrees.    Time  4    Period  Weeks    Status  New      PT LONG TERM GOAL #3   Title  Walk a community distance  with pain not > 2/10 without an assistive device.    Time  4    Period  Weeks    Status  New      PT LONG TERM GOAL #4   Title  Perform a reciprocating stair with pain not > 2/10.    Time  4    Period  Weeks    Status  New             Plan - 03/14/18 1255    Clinical Impression Statement  The patient presents to OPPT s/p right knee revision performed on 01/07/18.  She is pleased with her progress thus far.  She is lacking some extension and was instructed to start working hard on this at home.  She has an expected amount  of edema currently.  She is using a FWW for safe ambulation.  Steri-strips are currently intact and she has some residual right hip and knee weakness. Patient will benefit from skilled physical therapy intervention to address deficits.     History and Personal Factors relevant to plan of care:  arthritis, DM, Fibromyalgia, ankle and elbow surgery, left TKA.    Clinical Presentation  Stable    Clinical Presentation due to:  Good surgical outcome.    Clinical Decision Making  Low    Rehab Potential  Excellent    PT Frequency  3x / week    PT Duration  4 weeks    PT Treatment/Interventions  ADLs/Self Care Home Management;Cryotherapy;Electrical Stimulation;Therapeutic exercise;Therapeutic activities;Gait training;Stair training;Neuromuscular re-education;Patient/family education;Passive range of motion;Manual techniques;Vasopneumatic Device    PT Next Visit Plan  Nustep with quick prgression to bike; work hard on extension please.  VMS to right quadriceps; progress into TKA protocol.  Vasopneumatic and electrical stimulation.    Consulted and Agree with Plan of Care  Patient       Patient will benefit from skilled therapeutic intervention in order to improve the following deficits and impairments:  Abnormal gait, Pain, Decreased activity tolerance, Decreased range of motion, Decreased strength, Increased edema  Visit Diagnosis: Chronic pain of right knee - Plan: PT plan of care cert/re-cert  Localized edema - Plan: PT plan of care cert/re-cert  Stiffness of right knee, not elsewhere classified - Plan: PT plan of care cert/re-cert     Problem List Patient Active Problem List   Diagnosis Date Noted  . 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 Aortic atherosclerosis ([place]) 12/08/2017  . 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 Depression 02/28/2013  . GERD (gastroesophageal reflux disease) 02/28/2013  . Hiatal hernia with gastroesophageal reflux 02/28/2013  . Peripheral  neuropathy 02/28/2013  . DDD (degenerative disc disease), lumbosacral 06/23/2012  . Fibromyalgia syndrome 06/23/2012  . Essential hypertension, benign 06/23/2012  . Osteopenia 06/23/2012  . Diabetes ([place]) 06/23/2012  . Abnormal EKG 10/29/2010  . Obesity due to excess calories 10/29/2010  . 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 DYSPHAGIA 10/11/2009  . PERSONAL HISTORY OF COLONIC POLYPS 10/11/2009    Sylvania Moss, [place] MPT 03/14/2018, 1:07 PM  [facility] [address] [place], [place], [address] Phone: [phone]   Fax:  [phone]  Name: HAVANNA GRONER MRN: [id number] Date of Birth: May 05, 1945

## 2018-03-16 ENCOUNTER — Ambulatory Visit: Payer: Medicare Other | Admitting: Physical Therapy

## 2018-03-16 DIAGNOSIS — M25562 Pain in left knee: Secondary | ICD-10-CM

## 2018-03-16 DIAGNOSIS — M25661 Stiffness of right knee, not elsewhere classified: Secondary | ICD-10-CM

## 2018-03-16 DIAGNOSIS — R600 Localized edema: Secondary | ICD-10-CM | POA: Diagnosis not present

## 2018-03-16 DIAGNOSIS — G8929 Other chronic pain: Secondary | ICD-10-CM | POA: Diagnosis not present

## 2018-03-16 DIAGNOSIS — M25561 Pain in right knee: Secondary | ICD-10-CM | POA: Diagnosis not present

## 2018-03-16 NOTE — Therapy (Signed)
[facility] [place], [place], [address] Phone: [phone]   Fax:  [phone]  Physical Therapy Treatment  Patient Details  Name: [name] MRN: [id number] Date of Birth: [birth year] Referring Provider (PT): Gaynelle Arabian MD.   Encounter Date: 03/16/2018  PT End of Session - 03/16/18 1152    Visit Number  2    Number of Visits  12    Date for PT Re-Evaluation  04/11/18    Authorization Type  FOTO AT LEAST EVERY 5TH VISIT, 10TH VISIT PROGRESS NOTE AND KX MODIFIER AFTER THE 15 VISIT.    PT Start Time  1030    PT Stop Time  1117    PT Time Calculation (min)  47 min    Activity Tolerance  Patient tolerated treatment well    Behavior During Therapy  WFL for tasks assessed/performed       Past Medical History:  Diagnosis Date  . Arthritis    [place]   . Colon polyps   . Depression   . Diabetes mellitus, type 2 ([place]) 06/2010  . Diverticulosis   . Esophageal stricture   . Fibromyalgia    [place]   . GERD (gastroesophageal reflux disease)   . Hemorrhoids   . Hiatal hernia   . Hyperlipidemia   . Hypertension   . Menopause   . Peripheral vascular insufficiency ([place])   . Vitamin D deficiency     Past Surgical History:  Procedure Laterality Date  . ABDOMINAL HYSTERECTOMY     partial and then total  . ANKLE SURGERY     right  . ELBOW SURGERY     left  . JOINT REPLACEMENT Bilateral    knee right (15 years ago), left knee (10 years ago)  . REFRACTIVE SURGERY  2003  . REPLACEMENT TOTAL KNEE Bilateral   . TOTAL KNEE ARTHROPLASTY     bilateral  . TOTAL KNEE REVISION Right 03/09/2018   Procedure: RIGHT TOTAL KNEE REVISION;  Surgeon: Gaynelle Arabian, MD;  Location: [facility];  Service: Orthopedics;  Laterality: Right;  150min with abductor block  . VESICOVAGINAL FISTULA CLOSURE W/ TAH  1981    There were no vitals filed for this visit.  Subjective Assessment - 03/16/18 1237    Subjective  No new complaints.     Limitations  Walking    How long can you walk comfortably?  Short distances around home currently.    Patient Stated Goals  Get out of pain and walk independently.    Currently in Pain?  Yes    Pain Score  5     Pain Location  Knee    Pain Orientation  Right    Pain Descriptors / Indicators  Aching    Pain Type  Surgical pain    Pain Onset  In the past 7 days                       [facility] Adult PT Treatment/Exercise - 03/16/18 0001      Exercises   Exercises  Knee/Hip      Knee/Hip Exercises: Aerobic   Nustep  Level 1 x 15 minutes moving forward x 2 to increase knee flexion.      Knee/Hip Exercises: Supine   Short Arc Quad Sets Limitations  SAQ's facilitated with VMS to right quads x 10 minutes with 10 sec extension holds and 10 sec rest.      Modalities   Modalities  Vasopneumatic  Vasopneumatic   Number Minutes Vasopneumatic   10 minutes    Vasopnuematic Location   --   Right knee.   Vasopneumatic Pressure  Low      Manual Therapy   Manual Therapy  Passive ROM    Passive ROM  2 minutes flexion stretch to patient's right knee.               PT Short Term Goals - 03/14/18 1303      PT SHORT TERM GOAL #1   Title  Ind with an initial HEP.    Time  2    Period  Weeks    Status  New      PT SHORT TERM GOAL #2   Title  Full right knee active extension.    Time  2    Period  Weeks    Status  New        PT Long Term Goals - 03/14/18 1304      PT LONG TERM GOAL #1   Title  Independent with an advanced HEP.    Time  4    Period  Weeks    Status  New      PT LONG TERM GOAL #2   Title  Increase active right knee flexion to 120 degrees.    Time  4    Period  Weeks    Status  New      PT LONG TERM GOAL #3   Title  Walk a community distance with pain not > 2/10 without an assistive device.    Time  4    Period  Weeks    Status  New      PT LONG TERM GOAL #4   Title  Perform a reciprocating stair with pain not > 2/10.    Time   4    Period  Weeks    Status  New            Plan - 03/16/18 1252    Clinical Impression Statement  Excellent job today with passive right knee flexion to 110 degrees.    PT Frequency  3x / week    PT Duration  4 weeks    PT Treatment/Interventions  ADLs/Self Care Home Management;Cryotherapy;Electrical Stimulation;Therapeutic exercise;Therapeutic activities;Gait training;Stair training;Neuromuscular re-education;Patient/family education;Passive range of motion;Manual techniques;Vasopneumatic Device    PT Next Visit Plan  Nustep with quick prgression to bike; work hard on extension please.  VMS to right quadriceps; progress into TKA protocol.  Vasopneumatic and electrical stimulation.    Consulted and Agree with Plan of Care  Patient       Patient will benefit from skilled therapeutic intervention in order to improve the following deficits and impairments:  Abnormal gait, Pain, Decreased activity tolerance, Decreased range of motion, Decreased strength, Increased edema  Visit Diagnosis: Chronic pain of right knee  Localized edema  Stiffness of right knee, not elsewhere classified  Chronic pain of left knee     Problem List Patient Active Problem List   Diagnosis Date Noted  . 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 Aortic atherosclerosis ([place]) 12/08/2017  . 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 Depression 02/28/2013  . GERD (gastroesophageal reflux disease) 02/28/2013  . Hiatal hernia with gastroesophageal reflux 02/28/2013  . Peripheral neuropathy 02/28/2013  . DDD (degenerative disc disease), lumbosacral 06/23/2012  . Fibromyalgia syndrome 06/23/2012  . Essential hypertension, benign 06/23/2012  . Osteopenia 06/23/2012  . Diabetes ([place]) 06/23/2012  . Abnormal EKG  10/29/2010  . Obesity due to excess calories 10/29/2010  . 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 DYSPHAGIA 10/11/2009  . PERSONAL HISTORY OF COLONIC POLYPS  10/11/2009    [name], [place] MPT 03/16/2018, 12:55 PM  [facility] [address] [place], [place], [address] Phone: [phone]   Fax:  [phone]  Name: [name] MRN: [id number] Date of Birth: Feb 16, 1946

## 2018-03-18 ENCOUNTER — Ambulatory Visit: Payer: Medicare Other | Admitting: *Deleted

## 2018-03-18 DIAGNOSIS — M25661 Stiffness of right knee, not elsewhere classified: Secondary | ICD-10-CM | POA: Diagnosis not present

## 2018-03-18 DIAGNOSIS — M25561 Pain in right knee: Principal | ICD-10-CM

## 2018-03-18 DIAGNOSIS — G8929 Other chronic pain: Secondary | ICD-10-CM | POA: Diagnosis not present

## 2018-03-18 DIAGNOSIS — M25562 Pain in left knee: Secondary | ICD-10-CM | POA: Diagnosis not present

## 2018-03-18 DIAGNOSIS — R600 Localized edema: Secondary | ICD-10-CM

## 2018-03-18 NOTE — Therapy (Signed)
[facility] [place], [place], [address] Phone: [phone]   Fax:  [phone]  Physical Therapy Treatment  Patient Details  Name: [name] MRN: [id number] Date of Birth: [birth year] Referring Provider (PT): Gaynelle Arabian MD.   Encounter Date: 03/18/2018  PT End of Session - 03/18/18 1229    Visit Number  3    Number of Visits  12    Date for PT Re-Evaluation  04/11/18    Authorization Type  FOTO AT LEAST EVERY 5TH VISIT, 10TH VISIT PROGRESS NOTE AND KX MODIFIER AFTER THE 15 VISIT.    PT Start Time  1030    PT Stop Time  1125    PT Time Calculation (min)  55 min       Past Medical History:  Diagnosis Date  . Arthritis    [place]   . Colon polyps   . Depression   . Diabetes mellitus, type 2 ([place]) 06/2010  . Diverticulosis   . Esophageal stricture   . Fibromyalgia    [place]   . GERD (gastroesophageal reflux disease)   . Hemorrhoids   . Hiatal hernia   . Hyperlipidemia   . Hypertension   . Menopause   . Peripheral vascular insufficiency ([place])   . Vitamin D deficiency     Past Surgical History:  Procedure Laterality Date  . ABDOMINAL HYSTERECTOMY     partial and then total  . ANKLE SURGERY     right  . ELBOW SURGERY     left  . JOINT REPLACEMENT Bilateral    knee right (15 years ago), left knee (10 years ago)  . REFRACTIVE SURGERY  2003  . REPLACEMENT TOTAL KNEE Bilateral   . TOTAL KNEE ARTHROPLASTY     bilateral  . TOTAL KNEE REVISION Right 03/09/2018   Procedure: RIGHT TOTAL KNEE REVISION;  Surgeon: Gaynelle Arabian, MD;  Location: [facility];  Service: Orthopedics;  Laterality: Right;  180min with abductor block  . VESICOVAGINAL FISTULA CLOSURE W/ TAH  1981    There were no vitals filed for this visit.  Subjective Assessment - 03/18/18 1044    Subjective  RT knee doing ok    Patient is accompained by:  Family member    Limitations  Walking    How long can you walk comfortably?  Short  distances around home currently.    Patient Stated Goals  Get out of pain and walk independently.    Currently in Pain?  Yes    Pain Score  5     Pain Location  Knee    Pain Orientation  Right    Pain Descriptors / Indicators  Aching    Pain Type  Surgical pain    Pain Onset  In the past 7 days    Pain Frequency  Constant                       [facility] Adult PT Treatment/Exercise - 03/18/18 0001      Exercises   Exercises  Knee/Hip      Knee/Hip Exercises: Aerobic   Nustep  Level 1 x 16 minutes moving forward seat 9,8  to increase knee flexion.      Knee/Hip Exercises: Seated   Long Arc Quad  AROM;Right;3 sets;10 reps      Knee/Hip Exercises: Supine   Short Arc Quad Sets  AROM;Right   x 10 mins with VMS     Modalities   Modalities  Vasopneumatic;Electrical Stimulation      Acupuncturist Location  RT quad VMS x 10 mins 10 secs on/off with SAQs     Electrical Stimulation Goals  Tone      Vasopneumatic   Number Minutes Vasopneumatic   10 minutes    Vasopnuematic Location   Knee    Vasopneumatic Pressure  Low    Vasopneumatic Temperature   36      Manual Therapy   Manual Therapy  --               PT Short Term Goals - 03/14/18 1303      PT SHORT TERM GOAL #1   Title  Ind with an initial HEP.    Time  2    Period  Weeks    Status  New      PT SHORT TERM GOAL #2   Title  Full right knee active extension.    Time  2    Period  Weeks    Status  New        PT Long Term Goals - 03/14/18 1304      PT LONG TERM GOAL #1   Title  Independent with an advanced HEP.    Time  4    Period  Weeks    Status  New      PT LONG TERM GOAL #2   Title  Increase active right knee flexion to 120 degrees.    Time  4    Period  Weeks    Status  New      PT LONG TERM GOAL #3   Title  Walk a community distance with pain not > 2/10 without an assistive device.    Time  4    Period  Weeks    Status  New      PT LONG  TERM GOAL #4   Title  Perform a reciprocating stair with pain not > 2/10.    Time  4    Period  Weeks    Status  New            Plan - 03/18/18 1230    Clinical Impression Statement  Pt arrived today still ambulating with RW and doing fairly well with RT knee. She was able to perform more therex today without complaints. Her ROM for flexion was good at 110 degrees today , but still with extension deficit. Good mm facilitation with VMS while performing SAQs. Normal VASO response.    Clinical Presentation  Stable    Rehab Potential  Excellent    PT Frequency  3x / week    PT Duration  4 weeks    PT Treatment/Interventions  ADLs/Self Care Home Management;Cryotherapy;Electrical Stimulation;Therapeutic exercise;Therapeutic activities;Gait training;Stair training;Neuromuscular re-education;Patient/family education;Passive range of motion;Manual techniques;Vasopneumatic Device    PT Next Visit Plan  Nustep with quick prgression to bike; work hard on extension please.  VMS to right quadriceps; progress into TKA protocol.  Vasopneumatic and electrical stimulation.    Consulted and Agree with Plan of Care  Patient       Patient will benefit from skilled therapeutic intervention in order to improve the following deficits and impairments:  Abnormal gait, Pain, Decreased activity tolerance, Decreased range of motion, Decreased strength, Increased edema  Visit Diagnosis: Chronic pain of right knee  Localized edema  Stiffness of right knee, not elsewhere classified     Problem List Patient Active Problem List   Diagnosis Date  Noted  . 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 Aortic atherosclerosis ([place]) 12/08/2017  . 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 Depression 02/28/2013  . GERD (gastroesophageal reflux disease) 02/28/2013  . Hiatal hernia with gastroesophageal reflux 02/28/2013  . Peripheral neuropathy 02/28/2013  . DDD (degenerative disc disease),  lumbosacral 06/23/2012  . Fibromyalgia syndrome 06/23/2012  . Essential hypertension, benign 06/23/2012  . Osteopenia 06/23/2012  . Diabetes ([place]) 06/23/2012  . Abnormal EKG 10/29/2010  . Obesity due to excess calories 10/29/2010  . 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 DYSPHAGIA 10/11/2009  . PERSONAL HISTORY OF COLONIC POLYPS 10/11/2009    RAMSEUR,CHRIS, PTA 03/18/2018, 12:34 PM  [facility] [place], [place], [address] Phone: [phone]   Fax:  [phone]  Name: [name] MRN: [id number] Date of Birth: [birth year]

## 2018-03-21 ENCOUNTER — Ambulatory Visit: Payer: Medicare Other | Admitting: Physical Therapy

## 2018-03-21 ENCOUNTER — Encounter: Payer: Self-pay | Admitting: Physical Therapy

## 2018-03-21 DIAGNOSIS — M25561 Pain in right knee: Secondary | ICD-10-CM | POA: Diagnosis not present

## 2018-03-21 DIAGNOSIS — R600 Localized edema: Secondary | ICD-10-CM

## 2018-03-21 DIAGNOSIS — M25661 Stiffness of right knee, not elsewhere classified: Secondary | ICD-10-CM

## 2018-03-21 DIAGNOSIS — G8929 Other chronic pain: Secondary | ICD-10-CM

## 2018-03-21 DIAGNOSIS — M25562 Pain in left knee: Secondary | ICD-10-CM | POA: Diagnosis not present

## 2018-03-21 NOTE — Therapy (Signed)
[facility] [place], [place], [address] Phone: [phone]   Fax:  [phone]  Physical Therapy Treatment  Patient Details  Name: [name] MRN: [id number] Date of Birth: [birth year] Referring Provider (PT): Gaynelle Arabian MD.   Encounter Date: 03/21/2018  PT End of Session - 03/21/18 1009    Visit Number  4    Number of Visits  12    Date for PT Re-Evaluation  04/11/18    Authorization Type  FOTO AT LEAST EVERY 5TH VISIT, 10TH VISIT PROGRESS NOTE AND KX MODIFIER AFTER THE 15 VISIT.    PT Start Time  0945    PT Stop Time  1035    PT Time Calculation (min)  50 min    Activity Tolerance  Patient tolerated treatment well    Behavior During Therapy  WFL for tasks assessed/performed       Past Medical History:  Diagnosis Date  . Arthritis    [place]   . Colon polyps   . Depression   . Diabetes mellitus, type 2 ([place]) 06/2010  . Diverticulosis   . Esophageal stricture   . Fibromyalgia    [place]   . GERD (gastroesophageal reflux disease)   . Hemorrhoids   . Hiatal hernia   . Hyperlipidemia   . Hypertension   . Menopause   . Peripheral vascular insufficiency ([place])   . Vitamin D deficiency     Past Surgical History:  Procedure Laterality Date  . ABDOMINAL HYSTERECTOMY     partial and then total  . ANKLE SURGERY     right  . ELBOW SURGERY     left  . JOINT REPLACEMENT Bilateral    knee right (15 years ago), left knee (10 years ago)  . REFRACTIVE SURGERY  2003  . REPLACEMENT TOTAL KNEE Bilateral   . TOTAL KNEE ARTHROPLASTY     bilateral  . TOTAL KNEE REVISION Right 03/09/2018   Procedure: RIGHT TOTAL KNEE REVISION;  Surgeon: Gaynelle Arabian, MD;  Location: [facility];  Service: Orthopedics;  Laterality: Right;  147min with abductor block  . VESICOVAGINAL FISTULA CLOSURE W/ TAH  1981    There were no vitals filed for this visit.  Subjective Assessment - 03/21/18 1010    Subjective  Doing good.     Limitations  Walking    How long can you walk comfortably?  Short distances around home currently.    Patient Stated Goals  Get out of pain and walk independently.    Pain Score  5     Pain Location  Knee    Pain Orientation  Right    Pain Descriptors / Indicators  Aching    Pain Type  Surgical pain    Pain Onset  1 to 4 weeks ago                       [facility] Adult PT Treatment/Exercise - 03/21/18 0001      Exercises   Exercises  Knee/Hip      Knee/Hip Exercises: Aerobic   Nustep  Level 2 x 15 minutes.      Knee/Hip Exercises: Supine   Short Arc Quad Sets Limitations  SAQ's facilitated with VMS to right quads x 10 minutes with 10 sec extension holds and 10 sec rest0      Modalities   Modalities  Vasopneumatic      Vasopneumatic   Number Minutes Vasopneumatic   15 minutes  Vasopnuematic Location   --   Right knee.   Vasopneumatic Pressure  Low      Manual Therapy   Manual Therapy  Passive ROM    Passive ROM  Sustained right hamstring stretch x 2 minutes.               PT Short Term Goals - 03/14/18 1303      PT SHORT TERM GOAL #1   Title  Ind with an initial HEP.    Time  2    Period  Weeks    Status  New      PT SHORT TERM GOAL #2   Title  Full right knee active extension.    Time  2    Period  Weeks    Status  New        PT Long Term Goals - 03/14/18 1304      PT LONG TERM GOAL #1   Title  Independent with an advanced HEP.    Time  4    Period  Weeks    Status  New      PT LONG TERM GOAL #2   Title  Increase active right knee flexion to 120 degrees.    Time  4    Period  Weeks    Status  New      PT LONG TERM GOAL #3   Title  Walk a community distance with pain not > 2/10 without an assistive device.    Time  4    Period  Weeks    Status  New      PT LONG TERM GOAL #4   Title  Perform a reciprocating stair with pain not > 2/10.    Time  4    Period  Weeks    Status  New            Plan - 03/21/18 1036     Clinical Impression Statement  Patient did very well today.  She still lacks right knee extension but is improving.    Rehab Potential  Excellent    PT Frequency  3x / week    PT Duration  4 weeks    PT Treatment/Interventions  ADLs/Self Care Home Management;Cryotherapy;Electrical Stimulation;Therapeutic exercise;Therapeutic activities;Gait training;Stair training;Neuromuscular re-education;Patient/family education;Passive range of motion;Manual techniques;Vasopneumatic Device    PT Next Visit Plan  Nustep with quick prgression to bike; work hard on extension please.  VMS to right quadriceps; progress into TKA protocol.  Vasopneumatic and electrical stimulation.    Consulted and Agree with Plan of Care  Patient       Patient will benefit from skilled therapeutic intervention in order to improve the following deficits and impairments:  Abnormal gait, Pain, Decreased activity tolerance, Decreased range of motion, Decreased strength, Increased edema  Visit Diagnosis: Chronic pain of right knee  Localized edema  Stiffness of right knee, not elsewhere classified  Chronic pain of left knee     Problem List Patient Active Problem List   Diagnosis Date Noted  . 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 Aortic atherosclerosis ([place]) 12/08/2017  . 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 Depression 02/28/2013  . GERD (gastroesophageal reflux disease) 02/28/2013  . Hiatal hernia with gastroesophageal reflux 02/28/2013  . Peripheral neuropathy 02/28/2013  . DDD (degenerative disc disease), lumbosacral 06/23/2012  . Fibromyalgia syndrome 06/23/2012  . Essential hypertension, benign 06/23/2012  . Osteopenia 06/23/2012  . Diabetes ([place]) 06/23/2012  . Abnormal EKG 10/29/2010  .  Obesity due to excess calories 10/29/2010  . 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 DYSPHAGIA 10/11/2009  . PERSONAL HISTORY OF COLONIC POLYPS 10/11/2009    Kameron Glazebrook, [place]  MPT 03/21/2018, 10:37 AM  [facility] [address], [place], [address] Phone: [phone]   Fax:  [phone]  Name: [name] MRN: [id number] Date of Birth: [birth year]

## 2018-03-23 ENCOUNTER — Encounter: Payer: Self-pay | Admitting: Physical Therapy

## 2018-03-23 ENCOUNTER — Ambulatory Visit: Payer: Medicare Other | Admitting: Physical Therapy

## 2018-03-23 DIAGNOSIS — G8929 Other chronic pain: Secondary | ICD-10-CM

## 2018-03-23 DIAGNOSIS — R600 Localized edema: Secondary | ICD-10-CM | POA: Diagnosis not present

## 2018-03-23 DIAGNOSIS — M25561 Pain in right knee: Principal | ICD-10-CM

## 2018-03-23 DIAGNOSIS — M25661 Stiffness of right knee, not elsewhere classified: Secondary | ICD-10-CM | POA: Diagnosis not present

## 2018-03-23 DIAGNOSIS — M25562 Pain in left knee: Secondary | ICD-10-CM | POA: Diagnosis not present

## 2018-03-23 NOTE — Therapy (Signed)
[facility] [place], [place], [address] Phone: [phone]   Fax:  [phone]  Physical Therapy Treatment  Patient Details  Name: [name] MRN: [id number] Date of Birth: Sep 20, 1945 Referring Provider (PT): Gaynelle Arabian MD.   Encounter Date: 03/23/2018  PT End of Session - 03/23/18 1103    Visit Number  5    Number of Visits  12    Date for PT Re-Evaluation  04/11/18    Authorization Type  FOTO AT LEAST EVERY 5TH VISIT, 10TH VISIT PROGRESS NOTE AND KX MODIFIER AFTER THE 15 VISIT.    PT Start Time  [phone]    PT Stop Time  1038    PT Time Calculation (min)  51 min    Activity Tolerance  Patient tolerated treatment well    Behavior During Therapy  WFL for tasks assessed/performed       Past Medical History:  Diagnosis Date  . Arthritis    [place]   . Colon polyps   . Depression   . Diabetes mellitus, type 2 ([place]) 06/2010  . Diverticulosis   . Esophageal stricture   . Fibromyalgia    [place]   . GERD (gastroesophageal reflux disease)   . Hemorrhoids   . Hiatal hernia   . Hyperlipidemia   . Hypertension   . Menopause   . Peripheral vascular insufficiency ([place])   . Vitamin D deficiency     Past Surgical History:  Procedure Laterality Date  . ABDOMINAL HYSTERECTOMY     partial and then total  . ANKLE SURGERY     right  . ELBOW SURGERY     left  . JOINT REPLACEMENT Bilateral    knee right (15 years ago), left knee (10 years ago)  . REFRACTIVE SURGERY  2003  . REPLACEMENT TOTAL KNEE Bilateral   . TOTAL KNEE ARTHROPLASTY     bilateral  . TOTAL KNEE REVISION Right 03/09/2018   Procedure: RIGHT TOTAL KNEE REVISION;  Surgeon: Gaynelle Arabian, MD;  Location: [facility];  Service: Orthopedics;  Laterality: Right;  127min with abductor block  . VESICOVAGINAL FISTULA CLOSURE W/ TAH  1981    There were no vitals filed for this visit.  Subjective Assessment - 03/23/18 1058    Subjective  The doctor was  very pleased with how good I was doing.    Patient is accompained by:  Family member    Limitations  Walking    How long can you walk comfortably?  Short distances around home currently.    Patient Stated Goals  Get out of pain and walk independently.    Currently in Pain?  Yes    Pain Score  5     Pain Location  Knee    Pain Orientation  Right    Pain Type  Surgical pain    Pain Onset  1 to 4 weeks ago                       [facility] Adult PT Treatment/Exercise - 03/23/18 0001      Exercises   Exercises  Knee/Hip      Knee/Hip Exercises: Aerobic   Nustep  Level 3 x15 minutes.      Knee/Hip Exercises: Supine   Short Arc Quad Sets Limitations  SAQ's facilitated with VMS to right quads x 10 minutes (10 sec extension holds and 10 sec rest).      Modalities   Modalities  Electrical Stimulation;Vasopneumatic  Acupuncturist Location  Right knee.    Electrical Stimulation Action  IFC    Electrical Stimulation Parameters  1-10 Hz x 15 minutes.    Electrical Stimulation Goals  Edema      Vasopneumatic   Number Minutes Vasopneumatic   15 minutes    Vasopnuematic Location   --   Right knee.   Vasopneumatic Pressure  Low               PT Short Term Goals - 03/14/18 1303      PT SHORT TERM GOAL #1   Title  Ind with an initial HEP.    Time  2    Period  Weeks    Status  New      PT SHORT TERM GOAL #2   Title  Full right knee active extension.    Time  2    Period  Weeks    Status  New        PT Long Term Goals - 03/14/18 1304      PT LONG TERM GOAL #1   Title  Independent with an advanced HEP.    Time  4    Period  Weeks    Status  New      PT LONG TERM GOAL #2   Title  Increase active right knee flexion to 120 degrees.    Time  4    Period  Weeks    Status  New      PT LONG TERM GOAL #3   Title  Walk a community distance with pain not > 2/10 without an assistive device.    Time  4    Period  Weeks     Status  New      PT LONG TERM GOAL #4   Title  Perform a reciprocating stair with pain not > 2/10.    Time  4    Period  Weeks    Status  New            Plan - 03/23/18 1101    Clinical Impression Statement  Patient is making excellent progress.  Much improved right quadriceps activation now.    Rehab Potential  Excellent    PT Frequency  3x / week    PT Duration  4 weeks    PT Treatment/Interventions  ADLs/Self Care Home Management;Cryotherapy;Electrical Stimulation;Therapeutic exercise;Therapeutic activities;Gait training;Stair training;Neuromuscular re-education;Patient/family education;Passive range of motion;Manual techniques;Vasopneumatic Device    PT Next Visit Plan  Nustep with quick prgression to bike; work hard on extension please.  VMS to right quadriceps; progress into TKA protocol.  Vasopneumatic and electrical stimulation.    Consulted and Agree with Plan of Care  Patient       Patient will benefit from skilled therapeutic intervention in order to improve the following deficits and impairments:  Abnormal gait, Pain, Decreased activity tolerance, Decreased range of motion, Decreased strength, Increased edema  Visit Diagnosis: Chronic pain of right knee  Localized edema  Stiffness of right knee, not elsewhere classified     Problem List Patient Active Problem List   Diagnosis Date Noted  . 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 Aortic atherosclerosis ([place]) 12/08/2017  . 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 Depression 02/28/2013  . GERD (gastroesophageal reflux disease) 02/28/2013  . Hiatal hernia with gastroesophageal reflux 02/28/2013  . Peripheral neuropathy 02/28/2013  . DDD (degenerative disc disease), lumbosacral 06/23/2012  . Fibromyalgia syndrome 06/23/2012  .  Essential hypertension, benign 06/23/2012  . Osteopenia 06/23/2012  . Diabetes ([place]) 06/23/2012  . Abnormal EKG 10/29/2010  . Obesity due to excess calories  10/29/2010  . 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 DYSPHAGIA 10/11/2009  . PERSONAL HISTORY OF COLONIC POLYPS 10/11/2009    Vilma Will, [place] MPT 03/23/2018, 11:04 AM  [facility] [address] [place], [place], [address] Phone: [phone]   Fax:  [phone]  Name: GREGORY DOWE MRN: [id number] Date of Birth: [birth year]

## 2018-03-25 ENCOUNTER — Ambulatory Visit: Payer: Medicare Other | Admitting: *Deleted

## 2018-03-25 DIAGNOSIS — M25661 Stiffness of right knee, not elsewhere classified: Secondary | ICD-10-CM

## 2018-03-25 DIAGNOSIS — R600 Localized edema: Secondary | ICD-10-CM

## 2018-03-25 DIAGNOSIS — G8929 Other chronic pain: Secondary | ICD-10-CM | POA: Diagnosis not present

## 2018-03-25 DIAGNOSIS — M25561 Pain in right knee: Secondary | ICD-10-CM | POA: Diagnosis not present

## 2018-03-25 DIAGNOSIS — M25562 Pain in left knee: Secondary | ICD-10-CM | POA: Diagnosis not present

## 2018-03-25 NOTE — Therapy (Signed)
[facility] [place], [place], [address] Phone: [phone]   Fax:  [phone]  Physical Therapy Treatment  Patient Details  Name: [name] MRN: [id number] Date of Birth: April 07, 1945 Referring Provider (PT): Gaynelle Arabian MD.   Encounter Date: 03/25/2018  PT End of Session - 03/25/18 1036    Visit Number  6    Number of Visits  12    Date for PT Re-Evaluation  04/11/18    Authorization Type  FOTO AT LEAST EVERY 5TH VISIT, 10TH VISIT PROGRESS NOTE AND KX MODIFIER AFTER THE 15 VISIT.    PT Start Time  0945    PT Stop Time  1042    PT Time Calculation (min)  57 min       Past Medical History:  Diagnosis Date  . Arthritis    [place]   . Colon polyps   . Depression   . Diabetes mellitus, type 2 ([place]) 06/2010  . Diverticulosis   . Esophageal stricture   . Fibromyalgia    [place]   . GERD (gastroesophageal reflux disease)   . Hemorrhoids   . Hiatal hernia   . Hyperlipidemia   . Hypertension   . Menopause   . Peripheral vascular insufficiency ([place])   . Vitamin D deficiency     Past Surgical History:  Procedure Laterality Date  . ABDOMINAL HYSTERECTOMY     partial and then total  . ANKLE SURGERY     right  . ELBOW SURGERY     left  . JOINT REPLACEMENT Bilateral    knee right (15 years ago), left knee (10 years ago)  . REFRACTIVE SURGERY  2003  . REPLACEMENT TOTAL KNEE Bilateral   . TOTAL KNEE ARTHROPLASTY     bilateral  . TOTAL KNEE REVISION Right 03/09/2018   Procedure: RIGHT TOTAL KNEE REVISION;  Surgeon: Gaynelle Arabian, MD;  [facility]: [facility];  Service: Orthopedics;  Laterality: Right;  144min with abductor block  . VESICOVAGINAL FISTULA CLOSURE W/ TAH  1981    There were no vitals filed for this visit.  Subjective Assessment - 03/25/18 0954    Subjective  Doing good. Knee pain 3/10    Patient is accompained by:  Family member    Limitations  Walking    How long can you walk comfortably?   Short distances around home currently.    Patient Stated Goals  Get out of pain and walk independently.    Currently in Pain?  Yes    Pain Score  5     Pain Location  Knee    Pain Orientation  Right    Pain Descriptors / Indicators  Aching    Pain Type  Surgical pain    Pain Onset  1 to 4 weeks ago    Pain Frequency  Constant                       [facility] Adult PT Treatment/Exercise - 03/25/18 0001      Exercises   Exercises  Knee/Hip      Knee/Hip Exercises: Aerobic   Nustep  Level 3 x15 minutes.      Knee/Hip Exercises: Standing   Rocker Board  3 minutes      Knee/Hip Exercises: Seated   Long Arc Quad  AROM;Right;3 sets;10 reps      Knee/Hip Exercises: Supine   Short Arc Quad Sets  AROM   x 10 mins with VMS   Hip  Adduction Isometric  --      Modalities   Modalities  Electrical Stimulation;Vasopneumatic      Electrical Stimulation   Electrical Stimulation Location  Right knee. IFC x 15 mins     Electrical Stimulation Action  VMS x 10 mins 10 secs on / off  with SAQs    Electrical Stimulation Goals  Edema      Vasopneumatic   Number Minutes Vasopneumatic   15 minutes    Vasopnuematic Location   Knee    Vasopneumatic Pressure  Low    Vasopneumatic Temperature   36               PT Short Term Goals - 03/14/18 1303      PT SHORT TERM GOAL #1   Title  Ind with an initial HEP.    Time  2    Period  Weeks    Status  New      PT SHORT TERM GOAL #2   Title  Full right knee active extension.    Time  2    Period  Weeks    Status  New        PT Long Term Goals - 03/14/18 1304      PT LONG TERM GOAL #1   Title  Independent with an advanced HEP.    Time  4    Period  Weeks    Status  New      PT LONG TERM GOAL #2   Title  Increase active right knee flexion to 120 degrees.    Time  4    Period  Weeks    Status  New      PT LONG TERM GOAL #3   Title  Walk a community distance with pain not > 2/10 without an assistive device.     Time  4    Period  Weeks    Status  New      PT LONG TERM GOAL #4   Title  Perform a reciprocating stair with pain not > 2/10.    Time  4    Period  Weeks    Status  New            Plan - 03/25/18 1036    Clinical Impression Statement  Pt arrived today doing fairly well and reports MD was pleased. Pt was able to perform therex without complaints. Pt's quad control continues to improve. Incision still with steri-strips and healing welll . Normal modality response    Clinical Presentation  Stable    Rehab Potential  Excellent    PT Frequency  3x / week    PT Duration  4 weeks       Patient will benefit from skilled therapeutic intervention in order to improve the following deficits and impairments:  Abnormal gait, Pain, Decreased activity tolerance, Decreased range of motion, Decreased strength, Increased edema  Visit Diagnosis: Chronic pain of right knee  Localized edema  Stiffness of right knee, not elsewhere classified     Problem List Patient Active Problem List   Diagnosis Date Noted  . 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 Aortic atherosclerosis ([place]) 12/08/2017  . 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 Depression 02/28/2013  . GERD (gastroesophageal reflux disease) 02/28/2013  . Hiatal hernia with gastroesophageal reflux 02/28/2013  . Peripheral neuropathy 02/28/2013  . DDD (degenerative disc disease), lumbosacral 06/23/2012  . Fibromyalgia syndrome 06/23/2012  . Essential hypertension, benign 06/23/2012  .  Osteopenia 06/23/2012  . Diabetes ([place]) 06/23/2012  . Abnormal EKG 10/29/2010  . Obesity due to excess calories 10/29/2010  . 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 DYSPHAGIA 10/11/2009  . PERSONAL HISTORY OF COLONIC POLYPS 10/11/2009    Omar Gayden,CHRIS, PTA 03/25/2018, 12:34 PM  [facility] [place], [place], [address] Phone: [phone]   Fax:   [phone]  Name: [name] MRN: [id number] Date of Birth: [birth year]

## 2018-03-28 ENCOUNTER — Encounter: Payer: Self-pay | Admitting: Physical Therapy

## 2018-03-28 ENCOUNTER — Ambulatory Visit: Payer: Medicare Other | Admitting: Physical Therapy

## 2018-03-28 DIAGNOSIS — M25661 Stiffness of right knee, not elsewhere classified: Secondary | ICD-10-CM

## 2018-03-28 DIAGNOSIS — M25561 Pain in right knee: Principal | ICD-10-CM

## 2018-03-28 DIAGNOSIS — M25562 Pain in left knee: Secondary | ICD-10-CM | POA: Diagnosis not present

## 2018-03-28 DIAGNOSIS — R600 Localized edema: Secondary | ICD-10-CM | POA: Diagnosis not present

## 2018-03-28 DIAGNOSIS — G8929 Other chronic pain: Secondary | ICD-10-CM

## 2018-03-28 NOTE — Therapy (Signed)
[facility] [place], [place], [address] Phone: [phone]   Fax:  [phone]  Physical Therapy Treatment  Patient Details  Name: [name] MRN: [id number] Date of Birth: 04/30/1945 Referring Provider (PT): Gaynelle Arabian MD.   Encounter Date: 03/28/2018  PT End of Session - 03/28/18 1023    Visit Number  7    Number of Visits  12    Date for PT Re-Evaluation  04/11/18    Authorization Type  FOTO AT LEAST EVERY 5TH VISIT, 10TH VISIT PROGRESS NOTE AND KX MODIFIER AFTER THE 15 VISIT.    PT Start Time  [phone]    PT Stop Time  1029    PT Time Calculation (min)  43 min    Activity Tolerance  Patient tolerated treatment well    Behavior During Therapy  WFL for tasks assessed/performed       Past Medical History:  Diagnosis Date  . Arthritis    [place]   . Colon polyps   . Depression   . Diabetes mellitus, type 2 ([place]) 06/2010  . Diverticulosis   . Esophageal stricture   . Fibromyalgia    [place]   . GERD (gastroesophageal reflux disease)   . Hemorrhoids   . Hiatal hernia   . Hyperlipidemia   . Hypertension   . Menopause   . Peripheral vascular insufficiency ([place])   . Vitamin D deficiency     Past Surgical History:  Procedure Laterality Date  . ABDOMINAL HYSTERECTOMY     partial and then total  . ANKLE SURGERY     right  . ELBOW SURGERY     left  . JOINT REPLACEMENT Bilateral    knee right (15 years ago), left knee (10 years ago)  . REFRACTIVE SURGERY  2003  . REPLACEMENT TOTAL KNEE Bilateral   . TOTAL KNEE ARTHROPLASTY     bilateral  . TOTAL KNEE REVISION Right 03/09/2018   Procedure: RIGHT TOTAL KNEE REVISION;  Surgeon: Gaynelle Arabian, MD;  Location: [facility];  Service: Orthopedics;  Laterality: Right;  130min with abductor block  . VESICOVAGINAL FISTULA CLOSURE W/ TAH  1981    There were no vitals filed for this visit.  Subjective Assessment - 03/28/18 0955    Subjective  No new complaints.     Patient is accompained by:  Family member    Limitations  Walking    How long can you walk comfortably?  Short distances around home currently.    Patient Stated Goals  Get out of pain and walk independently.    Currently in Pain?  Yes    Pain Score  5     Pain Orientation  Right    Pain Descriptors / Indicators  Aching    Pain Onset  1 to 4 weeks ago         [facility] PT Assessment - 03/28/18 0001      AROM   Overall AROM Comments  -10 degrees to 130 degrees.                   [facility] Adult PT Treatment/Exercise - 03/28/18 0001      Exercises   Exercises  Knee/Hip      Knee/Hip Exercises: Aerobic   Nustep  Level 4 x 15 minutes moving forward x 2.      Knee/Hip Exercises: Supine   Short Arc Quad Sets Limitations  4 minutes with 2# with 5 sec extension holds.  Modalities   Modalities  Health visitor Stimulation Location  Right knee.    Electrical Stimulation Action  IFC    Electrical Stimulation Parameters  1-10 Hz x 15 minutes.    Electrical Stimulation Goals  Edema;Pain      Vasopneumatic   Number Minutes Vasopneumatic   15 minutes    Vasopnuematic Location   --   Right knee.   Vasopneumatic Pressure  Low      Manual Therapy   Manual Therapy  Passive ROM    Passive ROM  4 minutes passive right knee extension stretch.               PT Short Term Goals - 03/14/18 1303      PT SHORT TERM GOAL #1   Title  Ind with an initial HEP.    Time  2    Period  Weeks    Status  New      PT SHORT TERM GOAL #2   Title  Full right knee active extension.    Time  2    Period  Weeks    Status  New        PT Long Term Goals - 03/14/18 1304      PT LONG TERM GOAL #1   Title  Independent with an advanced HEP.    Time  4    Period  Weeks    Status  New      PT LONG TERM GOAL #2   Title  Increase active right knee flexion to 120 degrees.    Time  4    Period  Weeks    Status   New      PT LONG TERM GOAL #3   Title  Walk a community distance with pain not > 2/10 without an assistive device.    Time  4    Period  Weeks    Status  New      PT LONG TERM GOAL #4   Title  Perform a reciprocating stair with pain not > 2/10.    Time  4    Period  Weeks    Status  New            Plan - 03/28/18 1015    Clinical Impression Statement  Excellent improvement with right knee range ofmotion from -10 degrees to 130 degrees.  Improved right quadriceps activation as well.    Rehab Potential  Excellent    PT Frequency  3x / week    PT Duration  4 weeks    PT Treatment/Interventions  ADLs/Self Care Home Management;Cryotherapy;Electrical Stimulation;Therapeutic exercise;Therapeutic activities;Gait training;Stair training;Neuromuscular re-education;Patient/family education;Passive range of motion;Manual techniques;Vasopneumatic Device    PT Next Visit Plan  Nustep with quick prgression to bike; work hard on extension please.  VMS to right quadriceps; progress into TKA protocol.  Vasopneumatic and electrical stimulation.    Consulted and Agree with Plan of Care  Patient       Patient will benefit from skilled therapeutic intervention in order to improve the following deficits and impairments:  Abnormal gait, Pain, Decreased activity tolerance, Decreased range of motion, Decreased strength, Increased edema  Visit Diagnosis: Chronic pain of right knee  Localized edema  Stiffness of right knee, not elsewhere classified  Chronic pain of left knee     Problem List Patient Active Problem List   Diagnosis Date Noted  . Failed total knee arthroplasty ([place])  03/09/2018  . Aortic atherosclerosis ([place]) 12/08/2017  . 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 Depression 02/28/2013  . GERD (gastroesophageal reflux disease) 02/28/2013  . Hiatal hernia with gastroesophageal reflux 02/28/2013  . Peripheral neuropathy 02/28/2013  . DDD (degenerative disc disease),  lumbosacral 06/23/2012  . Fibromyalgia syndrome 06/23/2012  . Essential hypertension, benign 06/23/2012  . Osteopenia 06/23/2012  . Diabetes ([place]) 06/23/2012  . Abnormal EKG 10/29/2010  . Obesity due to excess calories 10/29/2010  . 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 DYSPHAGIA 10/11/2009  . PERSONAL HISTORY OF COLONIC POLYPS 10/11/2009    Rykar Lebleu, [place] MPT 03/28/2018, 10:32 AM  [facility] [address] [place], [place], [address] Phone: [phone]   Fax:  [phone]  Name: [name] MRN: [id number] Date of Birth: Nov 14, 1945

## 2018-03-29 ENCOUNTER — Ambulatory Visit: Payer: Medicare Other | Admitting: *Deleted

## 2018-03-29 DIAGNOSIS — M25562 Pain in left knee: Secondary | ICD-10-CM | POA: Diagnosis not present

## 2018-03-29 DIAGNOSIS — M25661 Stiffness of right knee, not elsewhere classified: Secondary | ICD-10-CM | POA: Diagnosis not present

## 2018-03-29 DIAGNOSIS — M25561 Pain in right knee: Secondary | ICD-10-CM | POA: Diagnosis not present

## 2018-03-29 DIAGNOSIS — R600 Localized edema: Secondary | ICD-10-CM

## 2018-03-29 DIAGNOSIS — G8929 Other chronic pain: Secondary | ICD-10-CM

## 2018-03-29 NOTE — Therapy (Signed)
[facility] [place], [place], [address] Phone: [phone]   Fax:  [phone]  Physical Therapy Treatment  Patient Details  Name: [name] MRN: [id number] Date of Birth: 1945-12-25 Referring Provider (PT): Gaynelle Arabian MD.   Encounter Date: 03/29/2018  PT End of Session - 03/29/18 1150    Visit Number  8    Number of Visits  12    Date for PT Re-Evaluation  04/11/18    Authorization Type  FOTO AT LEAST EVERY 5TH VISIT, 10TH VISIT PROGRESS NOTE AND KX MODIFIER AFTER THE 15 VISIT.    PT Start Time  1030    PT Stop Time  1128    PT Time Calculation (min)  58 min       Past Medical History:  Diagnosis Date  . Arthritis    [place]   . Colon polyps   . Depression   . Diabetes mellitus, type 2 ([place]) 06/2010  . Diverticulosis   . Esophageal stricture   . Fibromyalgia    [place]   . GERD (gastroesophageal reflux disease)   . Hemorrhoids   . Hiatal hernia   . Hyperlipidemia   . Hypertension   . Menopause   . Peripheral vascular insufficiency ([place])   . Vitamin D deficiency     Past Surgical History:  Procedure Laterality Date  . ABDOMINAL HYSTERECTOMY     partial and then total  . ANKLE SURGERY     right  . ELBOW SURGERY     left  . JOINT REPLACEMENT Bilateral    knee right (15 years ago), left knee (10 years ago)  . REFRACTIVE SURGERY  2003  . REPLACEMENT TOTAL KNEE Bilateral   . TOTAL KNEE ARTHROPLASTY     bilateral  . TOTAL KNEE REVISION Right 03/09/2018   Procedure: RIGHT TOTAL KNEE REVISION;  Surgeon: Gaynelle Arabian, MD;  Location: [facility];  Service: Orthopedics;  Laterality: Right;  135min with abductor block  . VESICOVAGINAL FISTULA CLOSURE W/ TAH  1981    There were no vitals filed for this visit.  Subjective Assessment - 03/29/18 1149    Subjective  RT knee is just sore    Patient is accompained by:  Family member    Limitations  Walking    How long can you walk comfortably?  Short  distances around home currently.    Patient Stated Goals  Get out of pain and walk independently.    Pain Score  5     Pain Location  Knee    Pain Orientation  Right    Pain Descriptors / Indicators  Aching    Pain Type  Surgical pain    Pain Onset  1 to 4 weeks ago    Pain Frequency  Constant                       [facility] Adult PT Treatment/Exercise - 03/29/18 0001      Exercises   Exercises  Knee/Hip      Knee/Hip Exercises: Aerobic   Nustep  Level 4 x 15 minutes moving forward x 2.      Knee/Hip Exercises: Supine   Short Arc Quad Sets  AROM   x 10 mins with VMS     Modalities   Modalities  Electrical Stimulation;Vasopneumatic      Electrical Stimulation   Electrical Stimulation Location  Right knee. IFC x 15 mins 80-150hz     Electrical Stimulation Action  VMS x10 mins with SAQs    Electrical Stimulation Goals  Edema      Vasopneumatic   Number Minutes Vasopneumatic   15 minutes    Vasopnuematic Location   Knee    Vasopneumatic Pressure  Low    Vasopneumatic Temperature   36      Manual Therapy   Manual Therapy  Passive ROM    Passive ROM   passive right knee extension stretch.               PT Short Term Goals - 03/14/18 1303      PT SHORT TERM GOAL #1   Title  Ind with an initial HEP.    Time  2    Period  Weeks    Status  New      PT SHORT TERM GOAL #2   Title  Full right knee active extension.    Time  2    Period  Weeks    Status  New        PT Long Term Goals - 03/14/18 1304      PT LONG TERM GOAL #1   Title  Independent with an advanced HEP.    Time  4    Period  Weeks    Status  New      PT LONG TERM GOAL #2   Title  Increase active right knee flexion to 120 degrees.    Time  4    Period  Weeks    Status  New      PT LONG TERM GOAL #3   Title  Walk a community distance with pain not > 2/10 without an assistive device.    Time  4    Period  Weeks    Status  New      PT LONG TERM GOAL #4   Title  Perform a  reciprocating stair with pain not > 2/10.    Time  4    Period  Weeks    Status  New            Plan - 03/29/18 1153    Clinical Impression Statement  Pt arrived today with mainly soreness RT knee. She did well with therex and has improved quad control/ activation. Her Flexion ROM has improved to 130 degrees , but still with extension deficit at -10 degrees . Normal modality response.    Clinical Presentation  Stable    Clinical Decision Making  Low    Rehab Potential  Excellent    PT Frequency  3x / week    PT Treatment/Interventions  ADLs/Self Care Home Management;Cryotherapy;Electrical Stimulation;Therapeutic exercise;Therapeutic activities;Gait training;Stair training;Neuromuscular re-education;Patient/family education;Passive range of motion;Manual techniques;Vasopneumatic Device    PT Next Visit Plan  Nustep with quick prgression to bike; work hard on extension please.  VMS to right quadriceps; progress into TKA protocol.  Vasopneumatic and electrical stimulation.       Patient will benefit from skilled therapeutic intervention in order to improve the following deficits and impairments:  Abnormal gait, Pain, Decreased activity tolerance, Decreased range of motion, Decreased strength, Increased edema  Visit Diagnosis: Localized edema  Stiffness of right knee, not elsewhere classified  Chronic pain of left knee  Chronic pain of right knee     Problem List Patient Active Problem List   Diagnosis Date Noted  . 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 Aortic atherosclerosis ([place]) 12/08/2017  . 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 Depression 02/28/2013  .  GERD (gastroesophageal reflux disease) 02/28/2013  . Hiatal hernia with gastroesophageal reflux 02/28/2013  . Peripheral neuropathy 02/28/2013  . DDD (degenerative disc disease), lumbosacral 06/23/2012  . Fibromyalgia syndrome 06/23/2012  . Essential hypertension, benign 06/23/2012  .  Osteopenia 06/23/2012  . Diabetes ([place]) 06/23/2012  . Abnormal EKG 10/29/2010  . Obesity due to excess calories 10/29/2010  . 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 DYSPHAGIA 10/11/2009  . PERSONAL HISTORY OF COLONIC POLYPS 10/11/2009    Jettie Mannor,CHRIS, PTA 03/29/2018, 11:59 AM  [facility] [place], [place], [address] Phone: [phone]   Fax:  [phone]  Name: DAYLAN BOGGESS MRN: [id number] Date of Birth: 1945/05/30

## 2018-03-31 ENCOUNTER — Other Ambulatory Visit: Payer: Self-pay | Admitting: Family Medicine

## 2018-04-01 ENCOUNTER — Ambulatory Visit: Payer: Medicare Other | Admitting: Physical Therapy

## 2018-04-01 DIAGNOSIS — M25562 Pain in left knee: Secondary | ICD-10-CM | POA: Diagnosis not present

## 2018-04-01 DIAGNOSIS — R600 Localized edema: Secondary | ICD-10-CM | POA: Diagnosis not present

## 2018-04-01 DIAGNOSIS — M25661 Stiffness of right knee, not elsewhere classified: Secondary | ICD-10-CM

## 2018-04-01 DIAGNOSIS — G8929 Other chronic pain: Secondary | ICD-10-CM | POA: Diagnosis not present

## 2018-04-01 DIAGNOSIS — M25561 Pain in right knee: Secondary | ICD-10-CM | POA: Diagnosis not present

## 2018-04-01 NOTE — Therapy (Signed)
[facility] [place], [place], [address] Phone: [phone]   Fax:  [phone]  Physical Therapy Treatment  Patient Details  Name: [name] MRN: [id number] Date of Birth: 1945-08-09 Referring Provider (PT): Gaynelle Arabian MD.   Encounter Date: 04/01/2018  PT End of Session - 04/01/18 0948    Visit Number  9    Number of Visits  12    Date for PT Re-Evaluation  04/11/18    Authorization Type  FOTO AT LEAST EVERY 5TH VISIT, 10TH VISIT PROGRESS NOTE AND KX MODIFIER AFTER THE 15 VISIT.    PT Start Time  [phone]    PT Stop Time  [phone]    PT Time Calculation (min)  45 min    Equipment Utilized During Treatment  Gait belt;Right knee immobilizer    Activity Tolerance  Patient tolerated treatment well    Behavior During Therapy  WFL for tasks assessed/performed       Past Medical History:  Diagnosis Date  . Arthritis    [place]   . Colon polyps   . Depression   . Diabetes mellitus, type 2 ([place]) 06/2010  . Diverticulosis   . Esophageal stricture   . Fibromyalgia    [place]   . GERD (gastroesophageal reflux disease)   . Hemorrhoids   . Hiatal hernia   . Hyperlipidemia   . Hypertension   . Menopause   . Peripheral vascular insufficiency ([place])   . Vitamin D deficiency     Past Surgical History:  Procedure Laterality Date  . ABDOMINAL HYSTERECTOMY     partial and then total  . ANKLE SURGERY     right  . ELBOW SURGERY     left  . JOINT REPLACEMENT Bilateral    knee right (15 years ago), left knee (10 years ago)  . REFRACTIVE SURGERY  2003  . REPLACEMENT TOTAL KNEE Bilateral   . TOTAL KNEE ARTHROPLASTY     bilateral  . TOTAL KNEE REVISION Right 03/09/2018   Procedure: RIGHT TOTAL KNEE REVISION;  Surgeon: Gaynelle Arabian, MD;  [facility]: [facility];  Service: Orthopedics;  Laterality: Right;  185min with abductor block  . VESICOVAGINAL FISTULA CLOSURE W/ TAH  1981    There were no vitals filed for this  visit.  Subjective Assessment - 04/01/18 0905    Subjective  Not feeling well.    Patient is accompained by:  Family member    Limitations  Walking    How long can you walk comfortably?  Short distances around home currently.    Patient Stated Goals  Get out of pain and walk independently.    Currently in Pain?  Yes    Pain Location  Knee    Pain Orientation  Right    Pain Descriptors / Indicators  Aching    Pain Type  Surgical pain    Pain Onset  1 to 4 weeks ago                       [facility] Adult PT Treatment/Exercise - 04/01/18 0001      Exercises   Exercises  Knee/Hip      Knee/Hip Exercises: Aerobic   Nustep  Level 4 x 23 minutes.      Modalities   Modalities  Psychologist, educational Location  Right knee    Electrical Stimulation Action  IFC     Electrical Stimulation  Parameters  1-10 Hz x 15 minutes.    Electrical Stimulation Goals  Edema;Pain      Vasopneumatic   Number Minutes Vasopneumatic   15 minutes    Vasopnuematic Location   --   Right knee.   Vasopneumatic Pressure  Low               PT Short Term Goals - 03/14/18 1303      PT SHORT TERM GOAL #1   Title  Ind with an initial HEP.    Time  2    Period  Weeks    Status  New      PT SHORT TERM GOAL #2   Title  Full right knee active extension.    Time  2    Period  Weeks    Status  New        PT Long Term Goals - 03/14/18 1304      PT LONG TERM GOAL #1   Title  Independent with an advanced HEP.    Time  4    Period  Weeks    Status  New      PT LONG TERM GOAL #2   Title  Increase active right knee flexion to 120 degrees.    Time  4    Period  Weeks    Status  New      PT LONG TERM GOAL #3   Title  Walk a community distance with pain not > 2/10 without an assistive device.    Time  4    Period  Weeks    Status  New      PT LONG TERM GOAL #4   Title  Perform a reciprocating stair with pain  not > 2/10.    Time  4    Period  Weeks    Status  New            Plan - 04/01/18 0946    Clinical Impression Statement  Patient progressing toward goals.    PT Treatment/Interventions  ADLs/Self Care Home Management;Cryotherapy;Electrical Stimulation;Therapeutic exercise;Therapeutic activities;Gait training;Stair training;Neuromuscular re-education;Patient/family education;Passive range of motion;Manual techniques;Vasopneumatic Device    PT Next Visit Plan  Nustep with quick prgression to bike; work hard on extension please.  VMS to right quadriceps; progress into TKA protocol.  Vasopneumatic and electrical stimulation.    Consulted and Agree with Plan of Care  Patient       Patient will benefit from skilled therapeutic intervention in order to improve the following deficits and impairments:  Abnormal gait, Pain, Decreased activity tolerance, Decreased range of motion, Decreased strength, Increased edema  Visit Diagnosis: Localized edema  Stiffness of right knee, not elsewhere classified     Problem List Patient Active Problem List   Diagnosis Date Noted  . 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 Aortic atherosclerosis ([place]) 12/08/2017  . 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 Depression 02/28/2013  . GERD (gastroesophageal reflux disease) 02/28/2013  . Hiatal hernia with gastroesophageal reflux 02/28/2013  . Peripheral neuropathy 02/28/2013  . DDD (degenerative disc disease), lumbosacral 06/23/2012  . Fibromyalgia syndrome 06/23/2012  . Essential hypertension, benign 06/23/2012  . Osteopenia 06/23/2012  . Diabetes ([place]) 06/23/2012  . Abnormal EKG 10/29/2010  . Obesity due to excess calories 10/29/2010  . 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 DYSPHAGIA 10/11/2009  . PERSONAL HISTORY OF COLONIC POLYPS 10/11/2009    Allexis Bordenave, [place] MPT 04/01/2018, 9:49 AM  [facility] [address]  [place], [place], [address] Phone: [phone]   Fax:  [phone]  Name: [name] MRN: [id number] Date of Birth: 01-18-1946

## 2018-04-04 ENCOUNTER — Ambulatory Visit: Payer: Medicare Other | Admitting: Physical Therapy

## 2018-04-04 ENCOUNTER — Encounter: Payer: Self-pay | Admitting: Physical Therapy

## 2018-04-04 DIAGNOSIS — M25562 Pain in left knee: Secondary | ICD-10-CM | POA: Diagnosis not present

## 2018-04-04 DIAGNOSIS — R600 Localized edema: Secondary | ICD-10-CM | POA: Diagnosis not present

## 2018-04-04 DIAGNOSIS — M25661 Stiffness of right knee, not elsewhere classified: Secondary | ICD-10-CM | POA: Diagnosis not present

## 2018-04-04 DIAGNOSIS — G8929 Other chronic pain: Secondary | ICD-10-CM | POA: Diagnosis not present

## 2018-04-04 DIAGNOSIS — M25561 Pain in right knee: Secondary | ICD-10-CM | POA: Diagnosis not present

## 2018-04-04 NOTE — Therapy (Signed)
[facility] [place], [place], [address] Phone: [phone]   Fax:  [phone]  Physical Therapy Treatment  Patient Details  Name: [name] MRN: [id number] Date of Birth: 1946-01-25 Referring Provider (PT): Gaynelle Arabian MD.   Encounter Date: 04/04/2018  PT End of Session - 04/04/18 0950    Visit Number  10    Number of Visits  12    Date for PT Re-Evaluation  04/11/18    Authorization Type  FOTO AT LEAST EVERY 5TH VISIT, 10TH VISIT PROGRESS NOTE AND KX MODIFIER AFTER THE 15 VISIT.    PT Start Time  [phone]    PT Stop Time  1033    PT Time Calculation (min)  49 min    Activity Tolerance  Patient tolerated treatment well       Past Medical History:  Diagnosis Date  . Arthritis    [place]   . Colon polyps   . Depression   . Diabetes mellitus, type 2 ([place]) 06/2010  . Diverticulosis   . Esophageal stricture   . Fibromyalgia    [place]   . GERD (gastroesophageal reflux disease)   . Hemorrhoids   . Hiatal hernia   . Hyperlipidemia   . Hypertension   . Menopause   . Peripheral vascular insufficiency ([place])   . Vitamin D deficiency     Past Surgical History:  Procedure Laterality Date  . ABDOMINAL HYSTERECTOMY     partial and then total  . ANKLE SURGERY     right  . ELBOW SURGERY     left  . JOINT REPLACEMENT Bilateral    knee right (15 years ago), left knee (10 years ago)  . REFRACTIVE SURGERY  2003  . REPLACEMENT TOTAL KNEE Bilateral   . TOTAL KNEE ARTHROPLASTY     bilateral  . TOTAL KNEE REVISION Right 03/09/2018   Procedure: RIGHT TOTAL KNEE REVISION;  Surgeon: Gaynelle Arabian, MD;  Location: [facility];  Service: Orthopedics;  Laterality: Right;  140min with abductor block  . VESICOVAGINAL FISTULA CLOSURE W/ TAH  1981    There were no vitals filed for this visit.  Subjective Assessment - 04/04/18 0951    Subjective  My knee feels stiff today.    Patient is accompained by:  Family member    Limitations  Walking    How long can you walk comfortably?  Short distances around home currently.    Patient Stated Goals  Get out of pain and walk independently.    Currently in Pain?  Yes    Pain Score  5     Pain Location  Knee    Pain Orientation  Right    Pain Descriptors / Indicators  --   "Stiff."   Pain Type  Surgical pain    Pain Onset  1 to 4 weeks ago                       [facility] Adult PT Treatment/Exercise - 04/04/18 0001      Exercises   Exercises  Knee/Hip      Knee/Hip Exercises: Aerobic   Nustep  Level 3 x 15 minutes.      Knee/Hip Exercises: Supine   Short Arc Quad Sets Limitations  5 minute SAQ's with 3#.      Modalities   Modalities  Cryotherapy;Electrical Stimulation      Cryotherapy   Number Minutes Cryotherapy  15 Minutes    Cryotherapy Location  --  Right knee.   Type of Cryotherapy  Ice pack      Manual Therapy   Manual Therapy  Passive ROM    Passive ROM  3 minute sustained right knee overpressure stretch.               PT Short Term Goals - 04/04/18 1039      PT SHORT TERM GOAL #1   Title  Ind with an initial HEP.    Time  2    Period  Weeks    Status  Achieved      PT SHORT TERM GOAL #2   Title  Full right knee active extension.    Time  2    Period  Weeks    Status  On-going        PT Long Term Goals - 04/04/18 1038      PT LONG TERM GOAL #1   Title  Independent with an advanced HEP.    Time  4    Period  Weeks    Status  On-going      PT LONG TERM GOAL #2   Title  Increase active right knee flexion to 120 degrees.    Time  4    Period  Weeks    Status  Achieved      PT LONG TERM GOAL #3   Title  Walk a community distance with pain not > 2/10 without an assistive device.    Time  4    Period  Weeks    Status  On-going      PT LONG TERM GOAL #4   Title  Perform a reciprocating stair with pain not > 2/10.    Time  4    Status  On-going            Plan - 04/04/18 1012    Clinical  Impression Statement  See "Therapy Note" section.    PT Frequency  3x / week    PT Duration  4 weeks    PT Treatment/Interventions  ADLs/Self Care Home Management;Cryotherapy;Electrical Stimulation;Therapeutic exercise;Therapeutic activities;Gait training;Stair training;Neuromuscular re-education;Patient/family education;Passive range of motion;Manual techniques;Vasopneumatic Device    PT Next Visit Plan  Nustep with quick prgression to bike; work hard on extension please.  VMS to right quadriceps; progress into TKA protocol.  Vasopneumatic and electrical stimulation.    Consulted and Agree with Plan of Care  Patient       Patient will benefit from skilled therapeutic intervention in order to improve the following deficits and impairments:  Abnormal gait, Pain, Decreased activity tolerance, Decreased range of motion, Decreased strength, Increased edema  Visit Diagnosis: Localized edema  Stiffness of right knee, not elsewhere classified     Problem List Patient Active Problem List   Diagnosis Date Noted  . 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 Aortic atherosclerosis ([place]) 12/08/2017  . 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 Depression 02/28/2013  . GERD (gastroesophageal reflux disease) 02/28/2013  . Hiatal hernia with gastroesophageal reflux 02/28/2013  . Peripheral neuropathy 02/28/2013  . DDD (degenerative disc disease), lumbosacral 06/23/2012  . Fibromyalgia syndrome 06/23/2012  . Essential hypertension, benign 06/23/2012  . Osteopenia 06/23/2012  . Diabetes ([place]) 06/23/2012  . Abnormal EKG 10/29/2010  . Obesity due to excess calories 10/29/2010  . 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 DYSPHAGIA 10/11/2009  . PERSONAL HISTORY OF COLONIC POLYPS 10/11/2009    Progress Note Reporting Period 03/14/18 to 04/04/18.  See note below for Objective Data  and Assessment of Progress/Goals.  Right knee flexion goal exceeded.  Excellent progress  thus far.      Rosaleigh Brazzel, [place] MPT 04/04/2018, 10:42 AM  [facility] [address] [place], [place], [address] Phone: [phone]   Fax:  [phone]  Name: EZMAE SPEERS MRN: [id number] Date of Birth: [birth year]

## 2018-04-05 ENCOUNTER — Ambulatory Visit: Payer: Medicare Other | Admitting: *Deleted

## 2018-04-05 DIAGNOSIS — M25562 Pain in left knee: Secondary | ICD-10-CM | POA: Diagnosis not present

## 2018-04-05 DIAGNOSIS — R600 Localized edema: Secondary | ICD-10-CM

## 2018-04-05 DIAGNOSIS — G8929 Other chronic pain: Secondary | ICD-10-CM | POA: Diagnosis not present

## 2018-04-05 DIAGNOSIS — M25661 Stiffness of right knee, not elsewhere classified: Secondary | ICD-10-CM

## 2018-04-05 DIAGNOSIS — M25561 Pain in right knee: Secondary | ICD-10-CM | POA: Diagnosis not present

## 2018-04-05 NOTE — Therapy (Signed)
[facility] [place], [place], [address] Phone: [phone]   Fax:  [phone]  Physical Therapy Treatment  Patient Details  Name: [name] MRN: [id number] Date of Birth: 10/18/1945 Referring Provider (PT): Gaynelle Arabian MD.   Encounter Date: 04/05/2018  PT End of Session - 04/05/18 0957    Visit Number  11    Number of Visits  12    Date for PT Re-Evaluation  04/11/18    Authorization Type  FOTO AT LEAST EVERY 5TH VISIT, 10TH VISIT PROGRESS NOTE AND KX MODIFIER AFTER THE 15 VISIT.    PT Start Time  0945    PT Stop Time  1040    PT Time Calculation (min)  55 min       Past Medical History:  Diagnosis Date  . Arthritis    [place]   . Colon polyps   . Depression   . Diabetes mellitus, type 2 ([place]) 06/2010  . Diverticulosis   . Esophageal stricture   . Fibromyalgia    [place]   . GERD (gastroesophageal reflux disease)   . Hemorrhoids   . Hiatal hernia   . Hyperlipidemia   . Hypertension   . Menopause   . Peripheral vascular insufficiency ([place])   . Vitamin D deficiency     Past Surgical History:  Procedure Laterality Date  . ABDOMINAL HYSTERECTOMY     partial and then total  . ANKLE SURGERY     right  . ELBOW SURGERY     left  . JOINT REPLACEMENT Bilateral    knee right (15 years ago), left knee (10 years ago)  . REFRACTIVE SURGERY  2003  . REPLACEMENT TOTAL KNEE Bilateral   . TOTAL KNEE ARTHROPLASTY     bilateral  . TOTAL KNEE REVISION Right 03/09/2018   Procedure: RIGHT TOTAL KNEE REVISION;  Surgeon: Gaynelle Arabian, MD;  [facility]: [facility];  Service: Orthopedics;  Laterality: Right;  140min with abductor block  . VESICOVAGINAL FISTULA CLOSURE W/ TAH  1981    There were no vitals filed for this visit.  Subjective Assessment - 04/05/18 0955    Subjective  Very, Very sore last night after Rx    Patient is accompained by:  Family member    Limitations  Walking    How long can you walk  comfortably?  Short distances around home currently.    Patient Stated Goals  Get out of pain and walk independently.    Currently in Pain?  Yes    Pain Score  5     Pain Location  Knee    Pain Orientation  Right    Pain Descriptors / Indicators  Aching;Sore    Pain Type  Surgical pain    Pain Onset  1 to 4 weeks ago                       [facility] Adult PT Treatment/Exercise - 04/05/18 0001      Exercises   Exercises  Knee/Hip      Knee/Hip Exercises: Aerobic   Nustep  Level 4 x 20 minutes.      Knee/Hip Exercises: Standing   Rocker Board  3 minutes      Modalities   Modalities  Cryotherapy;Teacher, English as a foreign language Location  Right knee IFC 1-10hz     Electrical Stimulation Goals  Edema;Pain      Vasopneumatic   Number Minutes  Vasopneumatic   15 minutes    Vasopnuematic Location   Knee    Vasopneumatic Pressure  Low    Vasopneumatic Temperature   36      Manual Therapy   Manual Therapy  Passive ROM    Passive ROM   sustained right knee overpressure stretch.               PT Short Term Goals - 04/04/18 1039      PT SHORT TERM GOAL #1   Title  Ind with an initial HEP.    Time  2    Period  Weeks    Status  Achieved      PT SHORT TERM GOAL #2   Title  Full right knee active extension.    Time  2    Period  Weeks    Status  On-going        PT Long Term Goals - 04/04/18 1038      PT LONG TERM GOAL #1   Title  Independent with an advanced HEP.    Time  4    Period  Weeks    Status  On-going      PT LONG TERM GOAL #2   Title  Increase active right knee flexion to 120 degrees.    Time  4    Period  Weeks    Status  Achieved      PT LONG TERM GOAL #3   Title  Walk a community distance with pain not > 2/10 without an assistive device.    Time  4    Period  Weeks    Status  On-going      PT LONG TERM GOAL #4   Title  Perform a reciprocating stair with pain not > 2/10.    Time  4     Status  On-going            Plan - 04/05/18 1041    Clinical Impression Statement  Pt arrived today with 5/10 pain and requested not to perform SAQs with wt due to soreness/pain afterwards. She was able to perform longer on Nustepfor ROM and rockerboard for calf stretching. Manual focused on extension (-6) ROM F/B modalities    Clinical Presentation  Stable    Clinical Decision Making  Low    Rehab Potential  Excellent    PT Frequency  3x / week    PT Duration  4 weeks    PT Treatment/Interventions  ADLs/Self Care Home Management;Cryotherapy;Electrical Stimulation;Therapeutic exercise;Therapeutic activities;Gait training;Stair training;Neuromuscular re-education;Patient/family education;Passive range of motion;Manual techniques;Vasopneumatic Device    PT Next Visit Plan  Nustep with quick prgression to bike; work hard on extension please.  VMS to right quadriceps; progress into TKA protocol.  Vasopneumatic and electrical stimulation.    Consulted and Agree with Plan of Care  Patient       Patient will benefit from skilled therapeutic intervention in order to improve the following deficits and impairments:  Abnormal gait, Pain, Decreased activity tolerance, Decreased range of motion, Decreased strength, Increased edema  Visit Diagnosis: Localized edema  Stiffness of right knee, not elsewhere classified     Problem List Patient Active Problem List   Diagnosis Date Noted  . 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 Aortic atherosclerosis ([place]) 12/08/2017  . 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 Depression 02/28/2013  . GERD (gastroesophageal reflux disease) 02/28/2013  . Hiatal hernia with gastroesophageal reflux 02/28/2013  . Peripheral neuropathy 02/28/2013  .  DDD (degenerative disc disease), lumbosacral 06/23/2012  . Fibromyalgia syndrome 06/23/2012  . Essential hypertension, benign 06/23/2012  . Osteopenia 06/23/2012  . Diabetes ([place])  06/23/2012  . Abnormal EKG 10/29/2010  . Obesity due to excess calories 10/29/2010  . 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 DYSPHAGIA 10/11/2009  . PERSONAL HISTORY OF COLONIC POLYPS 10/11/2009    RAMSEUR,CHRIS, PTA 04/05/2018, 11:49 AM  [facility] [place], [place], [address] Phone: [phone]   Fax:  [phone]  Name: [name] MRN: [id number] Date of Birth: [birth year]

## 2018-04-08 ENCOUNTER — Ambulatory Visit: Payer: Medicare Other | Attending: Orthopedic Surgery | Admitting: *Deleted

## 2018-04-08 DIAGNOSIS — R600 Localized edema: Secondary | ICD-10-CM | POA: Insufficient documentation

## 2018-04-08 DIAGNOSIS — M25661 Stiffness of right knee, not elsewhere classified: Secondary | ICD-10-CM | POA: Diagnosis not present

## 2018-04-08 NOTE — Therapy (Signed)
[facility] [place], [place], [address] Phone: [phone]   Fax:  [phone]  Physical Therapy Treatment  Patient Details  Name: [name] MRN: [id number] Date of Birth: Nov 24, 1945 Referring Provider (PT): Gaynelle Arabian MD.   Encounter Date: 04/08/2018  PT End of Session - 04/08/18 0947    Visit Number  12    Number of Visits  18    Date for PT Re-Evaluation  05/02/18    Authorization Type  FOTO AT LEAST EVERY 5TH VISIT, 10TH VISIT PROGRESS NOTE AND KX MODIFIER AFTER THE 15 VISIT.    PT Start Time  0900    PT Stop Time  0951    PT Time Calculation (min)  51 min       Past Medical History:  Diagnosis Date  . Arthritis    [place]   . Colon polyps   . Depression   . Diabetes mellitus, type 2 ([place]) 06/2010  . Diverticulosis   . Esophageal stricture   . Fibromyalgia    [place]   . GERD (gastroesophageal reflux disease)   . Hemorrhoids   . Hiatal hernia   . Hyperlipidemia   . Hypertension   . Menopause   . Peripheral vascular insufficiency ([place])   . Vitamin D deficiency     Past Surgical History:  Procedure Laterality Date  . ABDOMINAL HYSTERECTOMY     partial and then total  . ANKLE SURGERY     right  . ELBOW SURGERY     left  . JOINT REPLACEMENT Bilateral    knee right (15 years ago), left knee (10 years ago)  . REFRACTIVE SURGERY  2003  . REPLACEMENT TOTAL KNEE Bilateral   . TOTAL KNEE ARTHROPLASTY     bilateral  . TOTAL KNEE REVISION Right 03/09/2018   Procedure: RIGHT TOTAL KNEE REVISION;  Surgeon: Gaynelle Arabian, MD;  Location: [facility];  Service: Orthopedics;  Laterality: Right;  159min with abductor block  . VESICOVAGINAL FISTULA CLOSURE W/ TAH  1981    There were no vitals filed for this visit.  Subjective Assessment - 04/08/18 0913    Subjective  4-5/10 soreness and tightness today    Patient is accompained by:  Family member    Limitations  Walking    How long can you walk  comfortably?  Short distances around home currently.    Patient Stated Goals  Get out of pain and walk independently.    Currently in Pain?  Yes    Pain Score  4     Pain Location  Knee    Pain Orientation  Right    Pain Descriptors / Indicators  Aching;Sore    Pain Type  Surgical pain    Pain Onset  1 to 4 weeks ago                       [facility] Adult PT Treatment/Exercise - 04/08/18 0001      Exercises   Exercises  Knee/Hip      Knee/Hip Exercises: Aerobic   Nustep  Level 5 x 15 minutes.      Knee/Hip Exercises: Seated   Long Arc Quad  AROM;1 set;15 reps      Modalities   Modalities  Cryotherapy;Electrical Stimulation      Cryotherapy   Number Minutes Cryotherapy  15 Minutes    Cryotherapy Location  Knee    Type of Cryotherapy  Ice pack  Acupuncturist Location  Right knee IFC 1-10hz  x 15 mins    Electrical Stimulation Goals  Edema;Pain      Manual Therapy   Manual Therapy  Passive ROM    Passive ROM  PROM for flexion and extension               PT Short Term Goals - 04/04/18 1039      PT SHORT TERM GOAL #1   Title  Ind with an initial HEP.    Time  2    Period  Weeks    Status  Achieved      PT SHORT TERM GOAL #2   Title  Full right knee active extension.    Time  2    Period  Weeks    Status  On-going        PT Long Term Goals - 04/04/18 1038      PT LONG TERM GOAL #1   Title  Independent with an advanced HEP.    Time  4    Period  Weeks    Status  On-going      PT LONG TERM GOAL #2   Title  Increase active right knee flexion to 120 degrees.    Time  4    Period  Weeks    Status  Achieved      PT LONG TERM GOAL #3   Title  Walk a community distance with pain not > 2/10 without an assistive device.    Time  4    Period  Weeks    Status  On-going      PT LONG TERM GOAL #4   Title  Perform a reciprocating stair with pain not > 2/10.    Time  4    Status  On-going             Plan - 04/08/18 0947    Clinical Impression Statement  Pt arrived today with complaints of RT knee getting tighter. Rx focused on flexion and extension ROM. Flexion today to 120 degrees and extension to -8 degrees. Normal modalities response today    Clinical Presentation  Stable    Clinical Decision Making  Low    Rehab Potential  Excellent    PT Frequency  3x / week    PT Duration  4 weeks    PT Treatment/Interventions  ADLs/Self Care Home Management;Cryotherapy;Electrical Stimulation;Therapeutic exercise;Therapeutic activities;Gait training;Stair training;Neuromuscular re-education;Patient/family education;Passive range of motion;Manual techniques;Vasopneumatic Device    PT Next Visit Plan  Nustep with quick prgression to bike; work hard on extension please.  VMS to right quadriceps; progress into TKA protocol.  Vasopneumatic and electrical stimulation.    Consulted and Agree with Plan of Care  Patient       Patient will benefit from skilled therapeutic intervention in order to improve the following deficits and impairments:  Abnormal gait, Pain, Decreased activity tolerance, Decreased range of motion, Decreased strength, Increased edema  Visit Diagnosis: Localized edema  Stiffness of right knee, not elsewhere classified     Problem List Patient Active Problem List   Diagnosis Date Noted  . 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 Aortic atherosclerosis ([place]) 12/08/2017  . 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 Depression 02/28/2013  . GERD (gastroesophageal reflux disease) 02/28/2013  . Hiatal hernia with gastroesophageal reflux 02/28/2013  . Peripheral neuropathy 02/28/2013  . DDD (degenerative disc disease), lumbosacral 06/23/2012  . Fibromyalgia syndrome 06/23/2012  . Essential  hypertension, benign 06/23/2012  . Osteopenia 06/23/2012  . Diabetes ([place]) 06/23/2012  . Abnormal EKG 10/29/2010  . Obesity due to excess calories 10/29/2010   . 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 DYSPHAGIA 10/11/2009  . PERSONAL HISTORY OF COLONIC POLYPS 10/11/2009    Eirik Schueler,CHRIS 04/08/2018, 9:52 AM  [facility] [place], [place], [address] Phone: [phone]   Fax:  [phone]  Name: MIKEYA TOMASETTI MRN: [id number] Date of Birth: [birth year]

## 2018-04-11 ENCOUNTER — Encounter: Payer: Self-pay | Admitting: Physical Therapy

## 2018-04-11 ENCOUNTER — Ambulatory Visit: Payer: Medicare Other | Admitting: Physical Therapy

## 2018-04-11 DIAGNOSIS — M25661 Stiffness of right knee, not elsewhere classified: Secondary | ICD-10-CM

## 2018-04-11 DIAGNOSIS — R600 Localized edema: Secondary | ICD-10-CM

## 2018-04-11 NOTE — Therapy (Signed)
[facility] [place], [place], [address] Phone: [phone]   Fax:  [phone]  Physical Therapy Treatment  Patient Details  Name: [name] MRN: [id number] Date of Birth: 1945/09/15 Referring Provider (PT): Gaynelle Arabian MD.   Encounter Date: 04/11/2018  PT End of Session - 04/11/18 0900    Visit Number  13    Number of Visits  18    Date for PT Re-Evaluation  05/02/18    Authorization Type  FOTO AT LEAST EVERY 5TH VISIT, 10TH VISIT PROGRESS NOTE AND KX MODIFIER AFTER THE 15 VISIT.    PT Start Time  [phone]    PT Stop Time  0950    PT Time Calculation (min)  51 min    Activity Tolerance  Patient tolerated treatment well    Behavior During Therapy  [facility] for tasks assessed/performed       Past Medical History:  Diagnosis Date  . Arthritis    [place]   . Colon polyps   . Depression   . Diabetes mellitus, type 2 ([place]) 06/2010  . Diverticulosis   . Esophageal stricture   . Fibromyalgia    [place]   . GERD (gastroesophageal reflux disease)   . Hemorrhoids   . Hiatal hernia   . Hyperlipidemia   . Hypertension   . Menopause   . Peripheral vascular insufficiency ([place])   . Vitamin D deficiency     Past Surgical History:  Procedure Laterality Date  . ABDOMINAL HYSTERECTOMY     partial and then total  . ANKLE SURGERY     right  . ELBOW SURGERY     left  . JOINT REPLACEMENT Bilateral    knee right (15 years ago), left knee (10 years ago)  . REFRACTIVE SURGERY  2003  . REPLACEMENT TOTAL KNEE Bilateral   . TOTAL KNEE ARTHROPLASTY     bilateral  . TOTAL KNEE REVISION Right 03/09/2018   Procedure: RIGHT TOTAL KNEE REVISION;  Surgeon: Gaynelle Arabian, MD;  Location: [facility];  Service: Orthopedics;  Laterality: Right;  15min with abductor block  . VESICOVAGINAL FISTULA CLOSURE W/ TAH  1981    There were no vitals filed for this visit.  Subjective Assessment - 04/11/18 0900    Subjective  Reports feeling  alright and denied any pain today.    Limitations  Walking    How long can you walk comfortably?  Short distances around home currently.    Patient Stated Goals  Get out of pain and walk independently.    Currently in Pain?  No/denies         [facility] PT Assessment - 04/11/18 0001      Assessment   Medical Diagnosis  Right TKA revision.    Referring Provider (PT)  Gaynelle Arabian MD.    Onset Date/Surgical Date  03/09/18    Next MD Visit  04/14/2018      Restrictions   Weight Bearing Restrictions  No      ROM / Strength   AROM / PROM / Strength  AROM      AROM   Overall AROM   Deficits    AROM Assessment Site  Knee    Right/Left Knee  Right    Right Knee Extension  8                   [facility] Adult PT Treatment/Exercise - 04/11/18 0001      Knee/Hip Exercises: Aerobic   Nustep  Level 4 x 16 minutes.      Knee/Hip Exercises: Machines for Strengthening   Cybex Knee Extension  10# x20 reps    Cybex Knee Flexion  30# x30 reps      Knee/Hip Exercises: Standing   Hip Abduction  AROM;Both;20 reps;Knee straight      Modalities   Modalities  Psychologist, educational Location  R knee    Electrical Stimulation Action  IFC    Electrical Stimulation Parameters  80-150 hz x15 min    Electrical Stimulation Goals  Edema;Pain      Vasopneumatic   Number Minutes Vasopneumatic   15 minutes    Vasopnuematic Location   Knee    Vasopneumatic Pressure  Low    Vasopneumatic Temperature   36      Manual Therapy   Manual Therapy  Passive ROM    Passive ROM  PROM of R knee into extension with patellar mobs to improve ROM and mobility               PT Short Term Goals - 04/04/18 1039      PT SHORT TERM GOAL #1   Title  Ind with an initial HEP.    Time  2    Period  Weeks    Status  Achieved      PT SHORT TERM GOAL #2   Title  Full right knee active extension.    Time  2    Period  Weeks     Status  On-going        PT Long Term Goals - 04/11/18 0919      PT LONG TERM GOAL #1   Title  Independent with an advanced HEP.    Time  4    Period  Weeks    Status  On-going      PT LONG TERM GOAL #2   Title  Increase active right knee flexion to 120 degrees.    Time  4    Period  Weeks    Status  Achieved      PT LONG TERM GOAL #3   Title  Walk a community distance with pain not > 2/10 without an assistive device.    Time  4    Period  Weeks    Status  Achieved      PT LONG TERM GOAL #4   Title  Perform a reciprocating stair with pain not > 2/10.    Time  4    Status  On-going            Plan - 04/11/18 0945    Clinical Impression Statement  Patient continues to progress in PT as she was guided into machine knee strengthening. Patient's R knee extension still limited but increased edema still present as well. Following PROM of R knee into extension and patella mobs to improve mobility, extension was measured as 8 deg from neutral. Normal modalities response noted following removal of the modalities.    Rehab Potential  Excellent    PT Frequency  3x / week    PT Duration  4 weeks    PT Treatment/Interventions  ADLs/Self Care Home Management;Cryotherapy;Electrical Stimulation;Therapeutic exercise;Therapeutic activities;Gait training;Stair training;Neuromuscular re-education;Patient/family education;Passive range of motion;Manual techniques;Vasopneumatic Device    PT Next Visit Plan  Nustep with quick prgression to bike; work hard on extension please.  VMS to right quadriceps; progress into TKA protocol.  Vasopneumatic and electrical  stimulation.    Consulted and Agree with Plan of Care  Patient       Patient will benefit from skilled therapeutic intervention in order to improve the following deficits and impairments:  Abnormal gait, Pain, Decreased activity tolerance, Decreased range of motion, Decreased strength, Increased edema  Visit Diagnosis: Localized  edema  Stiffness of right knee, not elsewhere classified     Problem List Patient Active Problem List   Diagnosis Date Noted  . 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 Aortic atherosclerosis ([place]) 12/08/2017  . 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 Depression 02/28/2013  . GERD (gastroesophageal reflux disease) 02/28/2013  . Hiatal hernia with gastroesophageal reflux 02/28/2013  . Peripheral neuropathy 02/28/2013  . DDD (degenerative disc disease), lumbosacral 06/23/2012  . Fibromyalgia syndrome 06/23/2012  . Essential hypertension, benign 06/23/2012  . Osteopenia 06/23/2012  . Diabetes ([place]) 06/23/2012  . Abnormal EKG 10/29/2010  . Obesity due to excess calories 10/29/2010  . 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 DYSPHAGIA 10/11/2009  . PERSONAL HISTORY OF COLONIC POLYPS 10/11/2009    Standley Brooking, PTA 04/11/2018, 9:53 AM  [facility] [address] [place], [place], [address] Phone: [phone]   Fax:  [phone]  Name: MADDALYN LUTZE MRN: [id number] Date of Birth: 11-Nov-1945

## 2018-04-13 ENCOUNTER — Encounter: Payer: Self-pay | Admitting: Physical Therapy

## 2018-04-13 ENCOUNTER — Ambulatory Visit: Payer: Medicare Other | Admitting: Physical Therapy

## 2018-04-13 DIAGNOSIS — M25661 Stiffness of right knee, not elsewhere classified: Secondary | ICD-10-CM

## 2018-04-13 DIAGNOSIS — R600 Localized edema: Secondary | ICD-10-CM | POA: Diagnosis not present

## 2018-04-13 NOTE — Therapy (Signed)
[facility] [place], [place], [address] Phone: [phone]   Fax:  [phone]  Physical Therapy Treatment  Patient Details  Name: [name] MRN: [id number] Date of Birth: [birth year] Referring Provider (PT): Gaynelle Arabian MD.   Encounter Date: 04/13/2018  PT End of Session - 04/13/18 1148    Visit Number  14    Number of Visits  18    Date for PT Re-Evaluation  05/02/18    Authorization Type  FOTO AT LEAST EVERY 5TH VISIT, 10TH VISIT PROGRESS NOTE AND KX MODIFIER AFTER THE 15 VISIT.    PT Start Time  [phone]    PT Stop Time  0947    PT Time Calculation (min)  48 min    Activity Tolerance  Patient tolerated treatment well    Behavior During Therapy  [facility] for tasks assessed/performed       Past Medical History:  Diagnosis Date  . Arthritis    [place]   . Colon polyps   . Depression   . Diabetes mellitus, type 2 ([place]) 06/2010  . Diverticulosis   . Esophageal stricture   . Fibromyalgia    [place]   . GERD (gastroesophageal reflux disease)   . Hemorrhoids   . Hiatal hernia   . Hyperlipidemia   . Hypertension   . Menopause   . Peripheral vascular insufficiency ([place])   . Vitamin D deficiency     Past Surgical History:  Procedure Laterality Date  . ABDOMINAL HYSTERECTOMY     partial and then total  . ANKLE SURGERY     right  . ELBOW SURGERY     left  . JOINT REPLACEMENT Bilateral    knee right (15 years ago), left knee (10 years ago)  . REFRACTIVE SURGERY  2003  . REPLACEMENT TOTAL KNEE Bilateral   . TOTAL KNEE ARTHROPLASTY     bilateral  . TOTAL KNEE REVISION Right 03/09/2018   Procedure: RIGHT TOTAL KNEE REVISION;  Surgeon: Gaynelle Arabian, MD;  Location: [facility];  Service: Orthopedics;  Laterality: Right;  178min with abductor block  . VESICOVAGINAL FISTULA CLOSURE W/ TAH  1981    There were no vitals filed for this visit.  Subjective Assessment - 04/13/18 1149    Subjective  I passed out  briefly at home and fell and hit my cheek.  Calling doctor today.  Did not hit knee.    How long can you walk comfortably?  Short distances around home currently.    Patient Stated Goals  Get out of pain and walk independently.    Currently in Pain?  Yes    Pain Score  4     Pain Location  Knee    Pain Orientation  Right    Pain Descriptors / Indicators  Aching;Sore    Pain Type  Surgical pain    Pain Onset  1 to 4 weeks ago                       [facility] Adult PT Treatment/Exercise - 04/13/18 0001      Exercises   Exercises  Knee/Hip      Knee/Hip Exercises: Aerobic   Nustep  Level 4 x 15 minutes.      Modalities   Modalities  Health visitor Stimulation Location  Right knee.    Electrical Stimulation Action  IFC    Electrical Stimulation Parameters  80-150  Hz x 15 minutes.    Electrical Stimulation Goals  Edema;Pain      Vasopneumatic   Number Minutes Vasopneumatic   15 minutes    Vasopnuematic Location   --   Right knee.   Vasopneumatic Pressure  Low      Manual Therapy   Manual Therapy  Passive ROM    Passive ROM  Passive right knee extension stretching including right calf stretch and hamstring stretch x 8 minutes.               PT Short Term Goals - 04/04/18 1039      PT SHORT TERM GOAL #1   Title  Ind with an initial HEP.    Time  2    Period  Weeks    Status  Achieved      PT SHORT TERM GOAL #2   Title  Full right knee active extension.    Time  2    Period  Weeks    Status  On-going        PT Long Term Goals - 04/11/18 0919      PT LONG TERM GOAL #1   Title  Independent with an advanced HEP.    Time  4    Period  Weeks    Status  On-going      PT LONG TERM GOAL #2   Title  Increase active right knee flexion to 120 degrees.    Time  4    Period  Weeks    Status  Achieved      PT LONG TERM GOAL #3   Title  Walk a community distance with pain not > 2/10  without an assistive device.    Time  4    Period  Weeks    Status  Achieved      PT LONG TERM GOAL #4   Title  Perform a reciprocating stair with pain not > 2/10.    Time  4    Status  On-going            Plan - 04/13/18 1212    Clinical Impression Statement  Fall did not appear to affect knee.  She did well today with no increase in right knee pain.    PT Treatment/Interventions  ADLs/Self Care Home Management;Cryotherapy;Electrical Stimulation;Therapeutic exercise;Therapeutic activities;Gait training;Stair training;Neuromuscular re-education;Patient/family education;Passive range of motion;Manual techniques;Vasopneumatic Device    PT Next Visit Plan  Nustep with quick prgression to bike; work hard on extension please.  VMS to right quadriceps; progress into TKA protocol.  Vasopneumatic and electrical stimulation.    Consulted and Agree with Plan of Care  Patient       Patient will benefit from skilled therapeutic intervention in order to improve the following deficits and impairments:  Abnormal gait, Pain, Decreased activity tolerance, Decreased range of motion, Decreased strength, Increased edema  Visit Diagnosis: Localized edema  Stiffness of right knee, not elsewhere classified     Problem List Patient Active Problem List   Diagnosis Date Noted  . 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 Aortic atherosclerosis ([place]) 12/08/2017  . 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 Depression 02/28/2013  . GERD (gastroesophageal reflux disease) 02/28/2013  . Hiatal hernia with gastroesophageal reflux 02/28/2013  . Peripheral neuropathy 02/28/2013  . DDD (degenerative disc disease), lumbosacral 06/23/2012  . Fibromyalgia syndrome 06/23/2012  . Essential hypertension, benign 06/23/2012  . Osteopenia 06/23/2012  . Diabetes ([place]) 06/23/2012  . Abnormal EKG 10/29/2010  .  Obesity due to excess calories 10/29/2010  . OTHER NONTHROMBOCYTOPENIC PURPURAS  10/11/2009  . DYSPHAGIA 10/11/2009  . PERSONAL HISTORY OF COLONIC POLYPS 10/11/2009    Laurence Crofford, [place] MPT 04/13/2018, 12:13 PM  [facility] [address] [place], [place], [address] Phone: [phone]   Fax:  [phone]  Name: [name] MRN: [id number] Date of Birth: May 01, 1945

## 2018-04-14 DIAGNOSIS — Z471 Aftercare following joint replacement surgery: Secondary | ICD-10-CM | POA: Diagnosis not present

## 2018-04-14 DIAGNOSIS — Z96651 Presence of right artificial knee joint: Secondary | ICD-10-CM | POA: Diagnosis not present

## 2018-04-15 ENCOUNTER — Ambulatory Visit: Payer: Medicare Other | Admitting: *Deleted

## 2018-04-15 DIAGNOSIS — M25661 Stiffness of right knee, not elsewhere classified: Secondary | ICD-10-CM | POA: Diagnosis not present

## 2018-04-15 DIAGNOSIS — R600 Localized edema: Secondary | ICD-10-CM

## 2018-04-15 NOTE — Therapy (Signed)
[facility] [place], [name], [address] Phone: [phone]   Fax:  [phone]  Physical Therapy Treatment  Patient Details  Name: [name] MRN: [id number] Date of Birth: 1946/03/04 Referring Provider (PT): Gaynelle Arabian MD.   Encounter Date: 04/15/2018  PT End of Session - 04/15/18 0955    Visit Number  15    Number of Visits  18    Date for PT Re-Evaluation  05/02/18    Authorization Type  FOTO AT LEAST EVERY 5TH VISIT, 10TH VISIT PROGRESS NOTE AND KX MODIFIER AFTER THE 15 VISIT.    PT Start Time  0900    PT Stop Time  0955    PT Time Calculation (min)  55 min       Past Medical History:  Diagnosis Date  . Arthritis    [place]   . Colon polyps   . Depression   . Diabetes mellitus, type 2 ([place]) 06/2010  . Diverticulosis   . Esophageal stricture   . Fibromyalgia    [place]   . GERD (gastroesophageal reflux disease)   . Hemorrhoids   . Hiatal hernia   . Hyperlipidemia   . Hypertension   . Menopause   . Peripheral vascular insufficiency ([place])   . Vitamin D deficiency     Past Surgical History:  Procedure Laterality Date  . ABDOMINAL HYSTERECTOMY     partial and then total  . ANKLE SURGERY     right  . ELBOW SURGERY     left  . JOINT REPLACEMENT Bilateral    knee right (15 years ago), left knee (10 years ago)  . REFRACTIVE SURGERY  2003  . REPLACEMENT TOTAL KNEE Bilateral   . TOTAL KNEE ARTHROPLASTY     bilateral  . TOTAL KNEE REVISION Right 03/09/2018   Procedure: RIGHT TOTAL KNEE REVISION;  Surgeon: Gaynelle Arabian, MD;  Location: [facility];  Service: Orthopedics;  Laterality: Right;  144min with abductor block  . VESICOVAGINAL FISTULA CLOSURE W/ TAH  1981    There were no vitals filed for this visit.                    [facility] Adult PT Treatment/Exercise - 04/15/18 0001      Exercises   Exercises  Knee/Hip      Knee/Hip Exercises: Aerobic   Nustep  Level 5 x 15  minutes.      Modalities   Modalities  Health visitor Stimulation Location  Right knee.  IFC x 15 min 80-150hz     Electrical Stimulation Goals  Edema;Pain      Vasopneumatic   Number Minutes Vasopneumatic   15 minutes    Vasopnuematic Location   Knee    Vasopneumatic Pressure  Low    Vasopneumatic Temperature   36      Manual Therapy   Manual Therapy  Passive ROM    Passive ROM  PROM of R knee into extension with patellar mobs and sczr mobs  to improve ROM and mobility               PT Short Term Goals - 04/04/18 1039      PT SHORT TERM GOAL #1   Title  Ind with an initial HEP.    Time  2    Period  Weeks    Status  Achieved      PT SHORT TERM GOAL #2  Title  Full right knee active extension.    Time  2    Period  Weeks    Status  On-going        PT Long Term Goals - 04/11/18 0919      PT LONG TERM GOAL #1   Title  Independent with an advanced HEP.    Time  4    Period  Weeks    Status  On-going      PT LONG TERM GOAL #2   Title  Increase active right knee flexion to 120 degrees.    Time  4    Period  Weeks    Status  Achieved      PT LONG TERM GOAL #3   Title  Walk a community distance with pain not > 2/10 without an assistive device.    Time  4    Period  Weeks    Status  Achieved      PT LONG TERM GOAL #4   Title  Perform a reciprocating stair with pain not > 2/10.    Time  4    Status  On-going            Plan - 04/15/18 0951    Clinical Impression Statement  Pt arrived today after f/u visit with MD yesterday and reports he was very happy with her current status and wants her to work on extension ROM.She did well with therex and manual  PROM for extension. Normal modality response    Rehab Potential  Excellent    PT Frequency  3x / week    PT Duration  4 weeks    PT Treatment/Interventions  ADLs/Self Care Home Management;Cryotherapy;Electrical Stimulation;Therapeutic  exercise;Therapeutic activities;Gait training;Stair training;Neuromuscular re-education;Patient/family education;Passive range of motion;Manual techniques;Vasopneumatic Device    PT Next Visit Plan  Work on knee extension    Consulted and Agree with Plan of Care  Patient       Patient will benefit from skilled therapeutic intervention in order to improve the following deficits and impairments:  Abnormal gait, Pain, Decreased activity tolerance, Decreased range of motion, Decreased strength, Increased edema  Visit Diagnosis: Localized edema  Stiffness of right knee, not elsewhere classified     Problem List Patient Active Problem List   Diagnosis Date Noted  . 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 Aortic atherosclerosis ([place]) 12/08/2017  . 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 Depression 02/28/2013  . GERD (gastroesophageal reflux disease) 02/28/2013  . Hiatal hernia with gastroesophageal reflux 02/28/2013  . Peripheral neuropathy 02/28/2013  . DDD (degenerative disc disease), lumbosacral 06/23/2012  . Fibromyalgia syndrome 06/23/2012  . Essential hypertension, benign 06/23/2012  . Osteopenia 06/23/2012  . Diabetes ([place]) 06/23/2012  . Abnormal EKG 10/29/2010  . Obesity due to excess calories 10/29/2010  . 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 DYSPHAGIA 10/11/2009  . PERSONAL HISTORY OF COLONIC POLYPS 10/11/2009    [name],[name], [name] 04/15/2018, 9:56 AM  [name], [name], [name] Phone: [phone]   Fax:  [phone]  Name: [name] MRN: [id number] Date of Birth: Apr 01, 1946

## 2018-04-18 ENCOUNTER — Encounter: Payer: Self-pay | Admitting: Physical Therapy

## 2018-04-18 ENCOUNTER — Ambulatory Visit: Payer: Medicare Other | Admitting: Physical Therapy

## 2018-04-18 DIAGNOSIS — R600 Localized edema: Secondary | ICD-10-CM

## 2018-04-18 DIAGNOSIS — M25661 Stiffness of right knee, not elsewhere classified: Secondary | ICD-10-CM

## 2018-04-18 NOTE — Therapy (Signed)
[facility] [place], [place], [address] Phone: [phone]   Fax:  [phone]  Physical Therapy Evaluation  Patient Details  Name: [name] MRN: [id number] Date of Birth: May 01, 1945 Referring Provider (PT): Gaynelle Arabian MD.   Encounter Date: 04/18/2018  PT End of Session - 04/18/18 1003    Visit Number  16    Number of Visits  18    Date for PT Re-Evaluation  05/02/18    Authorization Type  FOTO AT LEAST EVERY 5TH VISIT, 10TH VISIT PROGRESS NOTE AND KX MODIFIER AFTER THE 15 VISIT.    PT Start Time  0900    PT Stop Time  0940    PT Time Calculation (min)  40 min    Activity Tolerance  Patient tolerated treatment well    Behavior During Therapy  WFL for tasks assessed/performed       Past Medical History:  Diagnosis Date  . Arthritis    [place]   . Colon polyps   . Depression   . Diabetes mellitus, type 2 ([place]) 06/2010  . Diverticulosis   . Esophageal stricture   . Fibromyalgia    [place]   . GERD (gastroesophageal reflux disease)   . Hemorrhoids   . Hiatal hernia   . Hyperlipidemia   . Hypertension   . Menopause   . Peripheral vascular insufficiency ([place])   . Vitamin D deficiency     Past Surgical History:  Procedure Laterality Date  . ABDOMINAL HYSTERECTOMY     partial and then total  . ANKLE SURGERY     right  . ELBOW SURGERY     left  . JOINT REPLACEMENT Bilateral    knee right (15 years ago), left knee (10 years ago)  . REFRACTIVE SURGERY  2003  . REPLACEMENT TOTAL KNEE Bilateral   . TOTAL KNEE ARTHROPLASTY     bilateral  . TOTAL KNEE REVISION Right 03/09/2018   Procedure: RIGHT TOTAL KNEE REVISION;  Surgeon: Gaynelle Arabian, MD;  Location: [facility];  Service: Orthopedics;  Laterality: Right;  14min with abductor block  . VESICOVAGINAL FISTULA CLOSURE W/ TAH  1981    There were no vitals filed for this visit.   Subjective Assessment - 04/18/18 0941    Subjective  Doctor was very  pleased.    Currently in Pain?  Yes    Pain Score  3     Pain Location  Knee    Pain Orientation  Right    Pain Descriptors / Indicators  Aching;Sore    Pain Type  Surgical pain    Pain Onset  1 to 4 weeks ago                    Objective measurements completed on examination: See above findings.      [facility] Adult PT Treatment/Exercise - 04/18/18 0001      Exercises   Exercises  Knee/Hip      Knee/Hip Exercises: Aerobic   Nustep  Level 5 x 15 minutes.      Knee/Hip Exercises: Machines for Strengthening   Cybex Knee Extension  10# x 1 minute    Cybex Knee Flexion  30# x 2 minutes.      Acupuncturist Location  --   Right knee.   Electrical Stimulation Action  IFC    Electrical Stimulation Parameters  1-10 Hz x 15 minutes.    Electrical Stimulation Goals  Edema;Pain  Vasopneumatic   Number Minutes Vasopneumatic   15 minutes    Vasopnuematic Location   --   Right knee.   Vasopneumatic Pressure  Low               PT Short Term Goals - 04/04/18 1039      PT SHORT TERM GOAL #1   Title  Ind with an initial HEP.    Time  2    Period  Weeks    Status  Achieved      PT SHORT TERM GOAL #2   Title  Full right knee active extension.    Time  2    Period  Weeks    Status  On-going        PT Long Term Goals - 04/11/18 0919      PT LONG TERM GOAL #1   Title  Independent with an advanced HEP.    Time  4    Period  Weeks    Status  On-going      PT LONG TERM GOAL #2   Title  Increase active right knee flexion to 120 degrees.    Time  4    Period  Weeks    Status  Achieved      PT LONG TERM GOAL #3   Title  Walk a community distance with pain not > 2/10 without an assistive device.    Time  4    Period  Weeks    Status  Achieved      PT LONG TERM GOAL #4   Title  Perform a reciprocating stair with pain not > 2/10.    Time  4    Status  On-going             Plan - 04/18/18 1004     Clinical Impression Statement  Excellent progress toward goals.    PT Treatment/Interventions  ADLs/Self Care Home Management;Cryotherapy;Electrical Stimulation;Therapeutic exercise;Therapeutic activities;Gait training;Stair training;Neuromuscular re-education;Patient/family education;Passive range of motion;Manual techniques;Vasopneumatic Device    PT Next Visit Plan  Work on knee extension    Consulted and Agree with Plan of Care  Patient       Patient will benefit from skilled therapeutic intervention in order to improve the following deficits and impairments:  Abnormal gait, Pain, Decreased activity tolerance, Decreased range of motion, Decreased strength, Increased edema  Visit Diagnosis: Localized edema  Stiffness of right knee, not elsewhere classified     Problem List Patient Active Problem List   Diagnosis Date Noted  . 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 Aortic atherosclerosis ([place]) 12/08/2017  . 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[place]) 01/14/2015  . Depression 02/28/2013  . GERD (gastroesophageal reflux disease) 02/28/2013  . Hiatal hernia with gastroesophageal reflux 02/28/2013  . Peripheral neuropathy 02/28/2013  . DDD (degenerative disc disease), lumbosacral 06/23/2012  . Fibromyalgia syndrome 06/23/2012  . Essential hypertension, benign 06/23/2012  . Osteopenia 06/23/2012  . Diabetes ([place]) 06/23/2012  . Abnormal EKG 10/29/2010  . Obesity due to excess calories 10/29/2010  . 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 DYSPHAGIA 10/11/2009  . PERSONAL HISTORY OF COLONIC POLYPS 10/11/2009    Marlon Suleiman, [place] MPT 04/18/2018, 10:04 AM  [facility] [address] [place], [place], [address] Phone: [phone]   Fax:  [phone]  Name: [name] MRN: [id number] Date of Birth: 03-30-1946

## 2018-04-20 ENCOUNTER — Other Ambulatory Visit: Payer: Self-pay | Admitting: *Deleted

## 2018-04-20 ENCOUNTER — Ambulatory Visit: Payer: Medicare Other | Admitting: Physical Therapy

## 2018-04-20 ENCOUNTER — Encounter: Payer: Self-pay | Admitting: Physical Therapy

## 2018-04-20 DIAGNOSIS — R600 Localized edema: Secondary | ICD-10-CM

## 2018-04-20 DIAGNOSIS — M25661 Stiffness of right knee, not elsewhere classified: Secondary | ICD-10-CM

## 2018-04-20 MED ORDER — GLUCOSE BLOOD VI STRP: ORAL_STRIP | 3 refills | Status: DC

## 2018-04-20 MED ORDER — ONETOUCH DELICA LANCETS 33G MISC: 3 refills | Status: DC

## 2018-04-20 NOTE — Therapy (Signed)
[facility] [place], [place], [address] Phone: [phone]   Fax:  [phone]  Physical Therapy Treatment  Patient Details  Name: [name] MRN: [id number] Date of Birth: Oct 25, 1945 Referring Provider (PT): Gaynelle Arabian MD.   Encounter Date: 04/20/2018  PT End of Session - 04/20/18 1041    Visit Number  17    Number of Visits  18    Date for PT Re-Evaluation  05/02/18    Authorization Type  FOTO AT LEAST EVERY 5TH VISIT, 10TH VISIT PROGRESS NOTE AND KX MODIFIER AFTER THE 15 VISIT.    PT Start Time  0900    PT Stop Time  0953    PT Time Calculation (min)  53 min    Activity Tolerance  Patient tolerated treatment well    Behavior During Therapy  WFL for tasks assessed/performed       Past Medical History:  Diagnosis Date  . Arthritis    [place]   . Colon polyps   . Depression   . Diabetes mellitus, type 2 ([place]) 06/2010  . Diverticulosis   . Esophageal stricture   . Fibromyalgia    [place]   . GERD (gastroesophageal reflux disease)   . Hemorrhoids   . Hiatal hernia   . Hyperlipidemia   . Hypertension   . Menopause   . Peripheral vascular insufficiency ([place])   . Vitamin D deficiency     Past Surgical History:  Procedure Laterality Date  . ABDOMINAL HYSTERECTOMY     partial and then total  . ANKLE SURGERY     right  . ELBOW SURGERY     left  . JOINT REPLACEMENT Bilateral    knee right (15 years ago), left knee (10 years ago)  . REFRACTIVE SURGERY  2003  . REPLACEMENT TOTAL KNEE Bilateral   . TOTAL KNEE ARTHROPLASTY     bilateral  . TOTAL KNEE REVISION Right 03/09/2018   Procedure: RIGHT TOTAL KNEE REVISION;  Surgeon: Gaynelle Arabian, MD;  Location: [facility];  Service: Orthopedics;  Laterality: Right;  182min with abductor block  . VESICOVAGINAL FISTULA CLOSURE W/ TAH  1981    There were no vitals filed for this visit.  Subjective Assessment - 04/20/18 1038    Subjective  Doing good.    How long can you walk comfortably?  Short distances around home currently.    Patient Stated Goals  Get out of pain and walk independently.    Currently in Pain?  Yes    Pain Score  3     Pain Location  Knee    Pain Orientation  Right    Pain Descriptors / Indicators  Aching;Sore    Pain Type  Surgical pain    Pain Onset  1 to 4 weeks ago                       [facility] Adult PT Treatment/Exercise - 04/20/18 0001      Exercises   Exercises  Knee/Hip      Knee/Hip Exercises: Aerobic   Nustep  Level 5 x 23 minutes.      Modalities   Modalities  Psychologist, educational Location  --   Right knee.   Electrical Stimulation Action  IFC    Electrical Stimulation Parameters  1-10 Hz x 20 minutes.    Electrical Stimulation Goals  Edema;Pain      Vasopneumatic  Number Minutes Vasopneumatic   20 minutes    Vasopnuematic Location   --   Right knee.   Vasopneumatic Pressure  Low               PT Short Term Goals - 04/04/18 1039      PT SHORT TERM GOAL #1   Title  Ind with an initial HEP.    Time  2    Period  Weeks    Status  Achieved      PT SHORT TERM GOAL #2   Title  Full right knee active extension.    Time  2    Period  Weeks    Status  On-going        PT Long Term Goals - 04/11/18 0919      PT LONG TERM GOAL #1   Title  Independent with an advanced HEP.    Time  4    Period  Weeks    Status  On-going      PT LONG TERM GOAL #2   Title  Increase active right knee flexion to 120 degrees.    Time  4    Period  Weeks    Status  Achieved      PT LONG TERM GOAL #3   Title  Walk a community distance with pain not > 2/10 without an assistive device.    Time  4    Period  Weeks    Status  Achieved      PT LONG TERM GOAL #4   Title  Perform a reciprocating stair with pain not > 2/10.    Time  4    Status  On-going            Plan - 04/20/18 1042    Clinical  Impression Statement  Patient has made excellent progress.  She lacks some right knee extension still but is expected to meet goals.    PT Treatment/Interventions  ADLs/Self Care Home Management;Cryotherapy;Electrical Stimulation;Therapeutic exercise;Therapeutic activities;Gait training;Stair training;Neuromuscular re-education;Patient/family education;Passive range of motion;Manual techniques;Vasopneumatic Device    PT Next Visit Plan  Work on knee extension    Consulted and Agree with Plan of Care  Patient       Patient will benefit from skilled therapeutic intervention in order to improve the following deficits and impairments:  Abnormal gait, Pain, Decreased activity tolerance, Decreased range of motion, Decreased strength, Increased edema  Visit Diagnosis: Localized edema  Stiffness of right knee, not elsewhere classified     Problem List Patient Active Problem List   Diagnosis Date Noted  . 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 Aortic atherosclerosis ([place]) 12/08/2017  . 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 Depression 02/28/2013  . GERD (gastroesophageal reflux disease) 02/28/2013  . Hiatal hernia with gastroesophageal reflux 02/28/2013  . Peripheral neuropathy 02/28/2013  . DDD (degenerative disc disease), lumbosacral 06/23/2012  . Fibromyalgia syndrome 06/23/2012  . Essential hypertension, benign 06/23/2012  . Osteopenia 06/23/2012  . Diabetes ([place]) 06/23/2012  . Abnormal EKG 10/29/2010  . Obesity due to excess calories 10/29/2010  . 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 DYSPHAGIA 10/11/2009  . PERSONAL HISTORY OF COLONIC POLYPS 10/11/2009    Tranise Forrest, [place] MPT 04/20/2018, 10:45 AM  [facility] [address] [place], [place], [address] Phone: [phone]   Fax:  [phone]  Name: SERETHA ESTABROOKS MRN: [id number] Date of Birth: 12/23/1945

## 2018-04-22 ENCOUNTER — Ambulatory Visit: Payer: Medicare Other | Admitting: *Deleted

## 2018-04-22 DIAGNOSIS — R600 Localized edema: Secondary | ICD-10-CM

## 2018-04-22 DIAGNOSIS — M25661 Stiffness of right knee, not elsewhere classified: Secondary | ICD-10-CM

## 2018-04-22 NOTE — Therapy (Signed)
[facility] [place], [place], [address] Phone: [phone]   Fax:  [phone]  Physical Therapy Treatment  Patient Details  Name: [name] MRN: [id number] Date of Birth: 11/03/1945 Referring Provider (PT): Gaynelle Arabian MD.   Encounter Date: 04/22/2018  PT End of Session - 04/22/18 0908    Visit Number  18    Number of Visits  18    Date for PT Re-Evaluation  05/02/18    Authorization Type  FOTO AT LEAST EVERY 5TH VISIT, 10TH VISIT PROGRESS NOTE AND KX MODIFIER AFTER THE 15 VISIT.    PT Start Time  0902    PT Stop Time  0954    PT Time Calculation (min)  52 min       Past Medical History:  Diagnosis Date  . Arthritis    [place]   . Colon polyps   . Depression   . Diabetes mellitus, type 2 ([place]) 06/2010  . Diverticulosis   . Esophageal stricture   . Fibromyalgia    [place]   . GERD (gastroesophageal reflux disease)   . Hemorrhoids   . Hiatal hernia   . Hyperlipidemia   . Hypertension   . Menopause   . Peripheral vascular insufficiency ([place])   . Vitamin D deficiency     Past Surgical History:  Procedure Laterality Date  . ABDOMINAL HYSTERECTOMY     partial and then total  . ANKLE SURGERY     right  . ELBOW SURGERY     left  . JOINT REPLACEMENT Bilateral    knee right (15 years ago), left knee (10 years ago)  . REFRACTIVE SURGERY  2003  . REPLACEMENT TOTAL KNEE Bilateral   . TOTAL KNEE ARTHROPLASTY     bilateral  . TOTAL KNEE REVISION Right 03/09/2018   Procedure: RIGHT TOTAL KNEE REVISION;  Surgeon: Gaynelle Arabian, MD;  Location: [facility];  Service: Orthopedics;  Laterality: Right;  156mn with abductor block  . VESICOVAGINAL FISTULA CLOSURE W/ TAH  1981    There were no vitals filed for this visit.  Subjective Assessment - 04/22/18 0906    Subjective  RT knee still stays stiff. Start riding my bike at home. DC today    Patient is accompained by:  Family member    Limitations  Walking     How long can you walk comfortably?  Short distances around home currently.    Patient Stated Goals  Get out of pain and walk independently.    Currently in Pain?  Yes    Pain Score  3     Pain Location  Knee    Pain Orientation  Right    Pain Type  Surgical pain    Pain Onset  1 to 4 weeks ago    Pain Frequency  Constant                                 PT Short Term Goals - 04/22/18 05277     PT SHORT TERM GOAL #1   Title  Ind with an initial HEP.    Time  2    Period  Weeks    Status  Achieved      PT SHORT TERM GOAL #2   Title  Full right knee active extension.    Time  2    Period  Weeks    Status  Not Met   -  5 degrees       PT Long Term Goals - 04/22/18 0910      PT LONG TERM GOAL #1   Title  Independent with an advanced HEP.    Time  4    Period  Weeks    Status  Achieved      PT LONG TERM GOAL #2   Title  Increase active right knee flexion to 120 degrees.    Time  4    Period  Weeks    Status  Achieved      PT LONG TERM GOAL #3   Title  Walk a community distance with pain not > 2/10 without an assistive device.    Time  4    Period  Weeks    Status  Achieved      PT LONG TERM GOAL #4   Title  Perform a reciprocating stair with pain not > 2/10.    Time  4    Period  Weeks    Status  Achieved            Plan - 04/22/18 4163    Clinical Impression Statement  Pt arrived today for her last visit and did well. She was able to perform all therex and was issued a HEP and is independent. She was able to meet all goals except full knee extension to -5 degrees. Normal rsponse to modalities.    Clinical Presentation  Stable    Clinical Decision Making  Low    Rehab Potential  Excellent    PT Frequency  3x / week    PT Duration  4 weeks    PT Treatment/Interventions  ADLs/Self Care Home Management;Cryotherapy;Electrical Stimulation;Therapeutic exercise;Therapeutic activities;Gait training;Stair training;Neuromuscular  re-education;Patient/family education;Passive range of motion;Manual techniques;Vasopneumatic Device    PT Next Visit Plan  Work on knee extension    Consulted and Agree with Plan of Care  Patient       Patient will benefit from skilled therapeutic intervention in order to improve the following deficits and impairments:  Abnormal gait, Pain, Decreased activity tolerance, Decreased range of motion, Decreased strength, Increased edema  Visit Diagnosis: Localized edema  Stiffness of right knee, not elsewhere classified     Problem List Patient Active Problem List   Diagnosis Date Noted  . 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 Aortic atherosclerosis ([place]) 12/08/2017  . 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 Depression 02/28/2013  . GERD (gastroesophageal reflux disease) 02/28/2013  . Hiatal hernia with gastroesophageal reflux 02/28/2013  . Peripheral neuropathy 02/28/2013  . DDD (degenerative disc disease), lumbosacral 06/23/2012  . Fibromyalgia syndrome 06/23/2012  . Essential hypertension, benign 06/23/2012  . Osteopenia 06/23/2012  . Diabetes ([place]) 06/23/2012  . Abnormal EKG 10/29/2010  . Obesity due to excess calories 10/29/2010  . 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 DYSPHAGIA 10/11/2009  . PERSONAL HISTORY OF COLONIC POLYPS 10/11/2009    ,CHRIS, PTA 04/22/2018, 9:56 AM  [facility] [place], [place], [address] Phone: [phone]   Fax:  [phone]  Name: [name] MRN: [id number] Date of Birth: 15-Jun-1945   PHYSICAL THERAPY DISCHARGE SUMMARY  Visits from Start of Care: 18.  Current functional level related to goals / functional outcomes: See above.   Remaining deficits: LTG's met.   Education / Equipment: HEP. Plan: Patient agrees to discharge.  Patient goals were met. Patient is being discharged due to meeting the stated rehab goals.  ?????  [place] Applegate MPT

## 2018-04-25 ENCOUNTER — Telehealth: Payer: Self-pay | Admitting: Family Medicine

## 2018-04-25 NOTE — Telephone Encounter (Signed)
Pt takes ozempic = states that vision in right eye is cloudy.  She is aware to call her EYE Dr before we discuss changing her meds

## 2018-04-26 ENCOUNTER — Encounter: Payer: Medicare Other | Admitting: *Deleted

## 2018-04-27 ENCOUNTER — Other Ambulatory Visit: Payer: Self-pay | Admitting: Family Medicine

## 2018-04-27 DIAGNOSIS — H4601 Optic papillitis, right eye: Secondary | ICD-10-CM | POA: Diagnosis not present

## 2018-04-28 ENCOUNTER — Encounter: Payer: Medicare Other | Admitting: *Deleted

## 2018-05-02 ENCOUNTER — Ambulatory Visit: Payer: Medicare Other | Admitting: Family Medicine

## 2018-05-05 DIAGNOSIS — H53412 Scotoma involving central area, left eye: Secondary | ICD-10-CM | POA: Diagnosis not present

## 2018-05-05 DIAGNOSIS — H4601 Optic papillitis, right eye: Secondary | ICD-10-CM | POA: Diagnosis not present

## 2018-05-05 DIAGNOSIS — H5203 Hypermetropia, bilateral: Secondary | ICD-10-CM | POA: Diagnosis not present

## 2018-05-05 DIAGNOSIS — H4602 Optic papillitis, left eye: Secondary | ICD-10-CM | POA: Diagnosis not present

## 2018-05-05 DIAGNOSIS — M316 Other giant cell arteritis: Secondary | ICD-10-CM | POA: Diagnosis not present

## 2018-05-07 DIAGNOSIS — J984 Other disorders of lung: Secondary | ICD-10-CM | POA: Diagnosis not present

## 2018-05-07 DIAGNOSIS — R918 Other nonspecific abnormal finding of lung field: Secondary | ICD-10-CM | POA: Diagnosis not present

## 2018-05-07 DIAGNOSIS — H543 Unqualified visual loss, both eyes: Secondary | ICD-10-CM | POA: Diagnosis not present

## 2018-05-07 DIAGNOSIS — R700 Elevated erythrocyte sedimentation rate: Secondary | ICD-10-CM | POA: Diagnosis not present

## 2018-05-07 DIAGNOSIS — E871 Hypo-osmolality and hyponatremia: Secondary | ICD-10-CM | POA: Diagnosis not present

## 2018-05-07 DIAGNOSIS — I700 Atherosclerosis of aorta: Secondary | ICD-10-CM | POA: Diagnosis not present

## 2018-05-07 DIAGNOSIS — R42 Dizziness and giddiness: Secondary | ICD-10-CM | POA: Diagnosis not present

## 2018-05-07 DIAGNOSIS — R7982 Elevated C-reactive protein (CRP): Secondary | ICD-10-CM | POA: Diagnosis not present

## 2018-05-07 DIAGNOSIS — H47392 Other disorders of optic disc, left eye: Secondary | ICD-10-CM | POA: Diagnosis not present

## 2018-05-07 DIAGNOSIS — Z89612 Acquired absence of left leg above knee: Secondary | ICD-10-CM | POA: Diagnosis not present

## 2018-05-07 DIAGNOSIS — H359 Unspecified retinal disorder: Secondary | ICD-10-CM | POA: Diagnosis not present

## 2018-05-07 DIAGNOSIS — H539 Unspecified visual disturbance: Secondary | ICD-10-CM | POA: Diagnosis not present

## 2018-05-07 DIAGNOSIS — D649 Anemia, unspecified: Secondary | ICD-10-CM | POA: Diagnosis not present

## 2018-05-07 DIAGNOSIS — M79661 Pain in right lower leg: Secondary | ICD-10-CM | POA: Diagnosis not present

## 2018-05-07 DIAGNOSIS — I10 Essential (primary) hypertension: Secondary | ICD-10-CM | POA: Diagnosis not present

## 2018-05-08 MED ORDER — GENERIC EXTERNAL MEDICATION
60.00 | Status: DC
Start: 2018-05-09 — End: 2018-05-08

## 2018-05-09 DIAGNOSIS — H349 Unspecified retinal vascular occlusion: Secondary | ICD-10-CM | POA: Diagnosis not present

## 2018-05-09 DIAGNOSIS — H353131 Nonexudative age-related macular degeneration, bilateral, early dry stage: Secondary | ICD-10-CM | POA: Diagnosis not present

## 2018-05-09 DIAGNOSIS — H3500 Unspecified background retinopathy: Secondary | ICD-10-CM | POA: Diagnosis not present

## 2018-05-09 DIAGNOSIS — H47012 Ischemic optic neuropathy, left eye: Secondary | ICD-10-CM | POA: Diagnosis not present

## 2018-05-09 DIAGNOSIS — H359 Unspecified retinal disorder: Secondary | ICD-10-CM | POA: Diagnosis not present

## 2018-05-09 DIAGNOSIS — H3402 Transient retinal artery occlusion, left eye: Secondary | ICD-10-CM | POA: Diagnosis not present

## 2018-05-09 DIAGNOSIS — H47092 Other disorders of optic nerve, not elsewhere classified, left eye: Secondary | ICD-10-CM | POA: Diagnosis not present

## 2018-05-16 DIAGNOSIS — H353130 Nonexudative age-related macular degeneration, bilateral, stage unspecified: Secondary | ICD-10-CM | POA: Diagnosis not present

## 2018-05-16 DIAGNOSIS — H349 Unspecified retinal vascular occlusion: Secondary | ICD-10-CM | POA: Diagnosis not present

## 2018-05-16 DIAGNOSIS — H3500 Unspecified background retinopathy: Secondary | ICD-10-CM | POA: Diagnosis not present

## 2018-05-16 DIAGNOSIS — Z96659 Presence of unspecified artificial knee joint: Secondary | ICD-10-CM | POA: Diagnosis not present

## 2018-05-17 ENCOUNTER — Encounter: Payer: Self-pay | Admitting: Family Medicine

## 2018-05-17 ENCOUNTER — Ambulatory Visit: Payer: Medicare Other | Admitting: Family Medicine

## 2018-05-17 VITALS — BP 111/62 | HR 95 | Temp 96.7°F | Ht 66.0 in | Wt 196.0 lb

## 2018-05-17 DIAGNOSIS — I10 Essential (primary) hypertension: Secondary | ICD-10-CM

## 2018-05-17 DIAGNOSIS — E785 Hyperlipidemia, unspecified: Secondary | ICD-10-CM

## 2018-05-17 DIAGNOSIS — G629 Polyneuropathy, unspecified: Secondary | ICD-10-CM

## 2018-05-17 DIAGNOSIS — E559 Vitamin D deficiency, unspecified: Secondary | ICD-10-CM

## 2018-05-17 DIAGNOSIS — E1169 Type 2 diabetes mellitus with other specified complication: Secondary | ICD-10-CM

## 2018-05-17 DIAGNOSIS — E1149 Type 2 diabetes mellitus with other diabetic neurological complication: Secondary | ICD-10-CM | POA: Diagnosis not present

## 2018-05-17 DIAGNOSIS — R251 Tremor, unspecified: Secondary | ICD-10-CM

## 2018-05-17 DIAGNOSIS — K219 Gastro-esophageal reflux disease without esophagitis: Secondary | ICD-10-CM

## 2018-05-17 DIAGNOSIS — R911 Solitary pulmonary nodule: Secondary | ICD-10-CM

## 2018-05-17 DIAGNOSIS — M797 Fibromyalgia: Secondary | ICD-10-CM

## 2018-05-17 NOTE — Patient Instructions (Addendum)
Medicare Annual Wellness Visit  [facility] and the medical providers at [facility] strive to bring you the best medical care.  In doing so we not only want to address your current medical conditions and concerns but also to detect new conditions early and prevent illness, disease and health-related problems.    Medicare offers a yearly Wellness Visit which allows our clinical staff to assess your need for preventative services including immunizations, lifestyle education, counseling to decrease risk of preventable diseases and screening for fall risk and other medical concerns.    This visit is provided free of charge (no copay) for all Medicare recipients. The clinical pharmacists at [facility] have begun to conduct these Wellness Visits which will also include a thorough review of all your medications.    As you primary medical provider recommend that you make an appointment for your Annual Wellness Visit if you have not done so already this year.  You may set up this appointment before you leave today or you may call back ([phone]) and schedule an appointment.  Please make sure when you call that you mention that you are scheduling your Annual Wellness Visit with the clinical pharmacist so that the appointment may be made for the proper length of time.    Continue current medications. Continue good therapeutic lifestyle changes which include good diet and exercise. Fall precautions discussed with patient. If an FOBT was given today- please return it to our front desk. If you are over 16 years old - you may need Prevnar 44 or the adult Pneumonia vaccine.  **Flu shots are available--- please call and schedule a FLU-CLINIC appointment**  After your visit with [place] today you will receive a survey in the mail or online from [employer] regarding your care with [place]. Please take a moment to fill this out. Your feedback is very  important to [place] as you can help [place] better understand your patient needs as well as improve your experience and satisfaction. WE CARE ABOUT YOU!!!   We will schedule visit for CT scan of the chest because of the abnormal chest x-ray that was found at [facility].  The previous x-ray here in September was normal. We will also schedule her for a visit with Dr. Henrene Pastor who can plan her future colonoscopy when she completes her treatment with the ophthalmologist at [facility]. We will also schedule her to see the neurologist because of the new onset tremor in the left hand and diminished sensation in both feet that have been going on for the past 1 to 2 months.

## 2018-05-17 NOTE — Progress Notes (Signed)
Subjective:    [name], female    DOB: [birth year], 73 y.o.   MRN: [id number]  HPI Pt here for follow up and management of chronic medical problems which includes diabetes and hypertension. She is taking medication regularly.  The patient is doing well overall but did have an episode recently requiring her to go to the emergency room.  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  She is being followed by the ophthalmologist regularly and has been on prednisone.  In the process of checking the visual issues out there was concern about a pulmonary embolus in light of her recent surgery.  The chest x-ray that was done did indicate a possible nodule in the lung.  The patient has had CT scans in the past and we will try to get the radiologist to compare the recent chest CT that was done on her to the chest x-ray that was done more recently.  She is currently taking prednisone for this condition and her blood sugars were affected by this but seem to be getting better.  She will return to the office for fasting lab work.  She was given an FOBT to bring back.  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  When she was examined there was some central vision loss.  She also had a chest x-ray that showed no acute cardia pulmonary process.  There was a dense nodular opacity measuring 1 cm in the left lung base.  We will try to get our radiologist to pull up these films so they can compare what we have had done on her previously with what was found in the emergency room.  Her last chest x-ray in this office showed some aortic atherosclerosis and that was in September 2019.  She did have a CT scan of the chest at [facility] in April 2015 that was normal.  The patient denies any chest pain.  She denies any shortness of breath.  She denies any trouble swallowing heartburn indigestion nausea vomiting diarrhea blood in the stool or black tarry bowel movements.  She is passing her  water without problems.  She also describes that her feet have been numb for 2 months and there is still no and this noticed when she was taking a bath with hot water and noticed that she could not feel the heat on her feet.  She is also had some tremor in the left hand.  And that has been going on for about 1 month.  It is mostly when she picks something up deliberately when she notices the tremor.  She says her blood sugars have been higher with the prednisone that she has been on for 2 weeks but she is now on a tapering dose.  And the sugars are doing better.  She is also past due getting her colonoscopy but did not get it on time because of having recent knee surgery.  We will go ahead and schedule her with Dr. Henrene Pastor so that this visit can be arranged on down the road when she is off of the prednisone.    Patient Active Problem List   Diagnosis Date Noted  . 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 Aortic atherosclerosis ([place]) 12/08/2017  . 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 Depression 02/28/2013  . GERD (gastroesophageal reflux disease) 02/28/2013  . Hiatal hernia with gastroesophageal reflux 02/28/2013  . Peripheral neuropathy 02/28/2013  . DDD (degenerative  disc disease), lumbosacral 06/23/2012  . Fibromyalgia syndrome 06/23/2012  . Essential hypertension, benign 06/23/2012  . Osteopenia 06/23/2012  . Diabetes ([place]) 06/23/2012  . Abnormal EKG 10/29/2010  . Obesity due to excess calories 10/29/2010  . 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 DYSPHAGIA 10/11/2009  . PERSONAL HISTORY OF COLONIC POLYPS 10/11/2009   Outpatient Encounter Medications as of 05/17/2018  Medication Sig  . ALPRAZolam (XANAX) 0.5 MG tablet Take 1-2 tabs daily as needed (Patient taking differently: Take 0.5 mg by mouth at bedtime. )  . ARIPiprazole (ABILIFY) 2 MG tablet Take 1 tablet (2 mg total) by mouth daily. (Patient taking differently: Take 1 mg by mouth at bedtime. )  .  blood glucose meter kit and supplies KIT Dispense based on patient and insurance preference. Use up to two times daily as directed. (Dx: type 2 DM - E11.9)  . Blood Glucose Monitoring Suppl DEVI Blood glucose meter One Touch Brand. Use to check BS up to BID. DX 250.02  . cholecalciferol (VITAMIN D) 1000 UNITS tablet Take 1,000 Units by mouth daily.   Marland Kitchen escitalopram (LEXAPRO) 20 MG tablet Take 1 tablet (20 mg total) by mouth daily.  Marland Kitchen esomeprazole (NEXIUM) 40 MG capsule TAKE  (1)  CAPSULE  TWICE DAILY. (Patient taking differently: Take 40 mg by mouth daily. )  . ezetimibe (ZETIA) 10 MG tablet Take 1 tablet (10 mg total) by mouth daily. (Patient taking differently: Take 5 mg by mouth daily. )  . gabapentin (NEURONTIN) 600 MG tablet Take 1 tablet (600 mg total) by mouth 4 (four) times daily. (Patient taking differently: Take 600-1,200 mg by mouth See admin instructions. Take 600 mg by mouth in the morning, take 600 mg by mouth in the afternoon and take 1200 mg by mouth at bedtime)  . glucose blood (ONE TOUCH ULTRA TEST) test strip CHECK BLOOD SUGAR UP TO 2 TIMES A DAY  . glucose monitoring kit (FREESTYLE) monitoring kit Please dispense whichever glucometer patient's insurance will cover.  Use to check BG up to twice.  DX: 250.02  . HYDROmorphone (DILAUDID) 4 MG tablet Take 1 tablet (4 mg total) by mouth every 6 (six) hours as needed for moderate pain.  Marland Kitchen lisinopril-hydrochlorothiazide (PRINZIDE,ZESTORETIC) 20-25 MG tablet Take 1 tablet by mouth daily.  . metFORMIN (GLUCOPHAGE-XR) 500 MG 24 hr tablet Take 2 tablets (1,000 mg total) by mouth daily with breakfast.  . methocarbamol (ROBAXIN) 500 MG tablet Take 1 tablet (500 mg total) by mouth every 6 (six) hours as needed for muscle spasms.  . Omega-3 Fatty Acids (FISH OIL) 1000 MG CAPS Take 2,000 mg by mouth daily.   [name] DELICA LANCETS 85O MISC CHECK BLOOD SUGAR UP TO TWICE DAILY  . Pitavastatin Calcium (LIVALO) 2 MG TABS TAKE  (1)  TABLET  EVERY  OTHER DAY. (Patient taking differently: Take 2 mg by mouth every Monday, Wednesday, and Friday. )  . PREMARIN vaginal cream Place 1 Applicatorful vaginally at bedtime as needed. (Patient taking differently: Place 1 Applicatorful vaginally at bedtime as needed (for dryness). )  . Semaglutide, 1 MG/DOSE, (OZEMPIC, 1 MG/DOSE,) 2 MG/1.5ML SOPN Inject 1 mg as directed once a week.  . predniSONE (DELTASONE) 10 MG tablet Take 4 tablets by mouth daily.   No facility-administered encounter medications on file as of 05/17/2018.      Review of Systems  Constitutional: Negative.   HENT: Negative.   Eyes: Negative.   Respiratory: Negative.   Cardiovascular: Negative.   Gastrointestinal: Negative.   Endocrine:  Negative.   Genitourinary: Negative.   Musculoskeletal: Negative.   Skin: Negative.   Allergic/Immunologic: Negative.   Neurological: Negative.   Hematological: Negative.   Psychiatric/Behavioral: Negative.        Objective:   Physical Exam Vitals signs and nursing note reviewed.  Constitutional:      General: She is not in acute distress.    Appearance: Normal appearance. She is well-developed. She is obese.     Comments: The patient today is alert and calm and was a good historian providing information as to what has happened to her recently.  HENT:     Head: Normocephalic and atraumatic.     Right Ear: Tympanic membrane, ear canal and external ear normal. There is no impacted cerumen.     Left Ear: Tympanic membrane, ear canal and external ear normal. There is no impacted cerumen.     Nose: Nose normal. No congestion.     Mouth/Throat:     Mouth: Mucous membranes are moist.     Pharynx: Oropharynx is clear. No oropharyngeal exudate or posterior oropharyngeal erythema.  Eyes:     General: No scleral icterus.       Right eye: No discharge.        Left eye: No discharge.     Extraocular Movements: Extraocular movements intact.     Conjunctiva/sclera: Conjunctivae normal.      Pupils: Pupils are equal, round, and reactive to light.  Neck:     Musculoskeletal: Normal range of motion and neck supple.     Thyroid: No thyromegaly.     Vascular: No carotid bruit or JVD.     Comments: No thyromegaly anterior cervical adenopathy or bruits Cardiovascular:     Rate and Rhythm: Normal rate and regular rhythm.     Pulses: Normal pulses.     Heart sounds: Normal heart sounds. No murmur.     Comments: The heart is regular at 72/min with no pedal edema and good pedal pulses Pulmonary:     Effort: Pulmonary effort is normal.     Breath sounds: Normal breath sounds. No wheezing or rales.     Comments: Clear anteriorly and posteriorly Abdominal:     General: Bowel sounds are normal.     Palpations: Abdomen is soft. There is no mass.     Tenderness: There is no abdominal tenderness.     Comments: No masses tenderness organ enlargement or bruits  Musculoskeletal: Normal range of motion.        General: No swelling or tenderness.     Right lower leg: No edema.     Left lower leg: No edema.     Comments: Patient has ongoing problems with recent right knee surgery which is getting better but this limits her mobility somewhat still.  Lymphadenopathy:     Cervical: No cervical adenopathy.  Skin:    General: Skin is warm and dry.     Findings: No rash.  Neurological:     General: No focal deficit present.     Mental Status: She is alert and oriented to person, place, and time. Mental status is at baseline.     Cranial Nerves: No cranial nerve deficit.     Sensory: No sensory deficit.     Gait: Gait normal.     Deep Tendon Reflexes: Reflexes are normal and symmetric. Reflexes normal.  Psychiatric:        Mood and Affect: Mood normal.        Behavior: Behavior normal.  Thought Content: Thought content normal.        Judgment: Judgment normal.    BP 111/62 (BP Location: Left Arm)   Pulse 95   Temp (!) 96.7 F (35.9 C) (Oral)   Ht 5' 6"  (1.676 m)   Wt 196 lb  (88.9 kg)   BMI 31.64 kg/m         Assessment & Plan:  1. Type 2 diabetes mellitus with other neurologic complication, without long-term current use of insulin ([place]) -Continue with current treatment pending results of lab work.  Hopefully blood sugars will come down once patient has her prednisone reduced more. - CBC with Differential/Platelet; Future - Bayer DCA Hb A1c Waived; Future  2. Essential hypertension, benign -Blood pressure is good and she will continue with current treatment - Hepatic function panel; Future - BMP8+EGFR; Future - CBC with Differential/Platelet; Future  3. Hyperlipidemia associated with type 2 diabetes mellitus ([place]) -Continue with current treatment pending results of lab work - CBC with Differential/Platelet; Future - Lipid panel; Future  4. Vitamin D deficiency -Continue with vitamin D replacement pending results of lab work - VITAMIN D 25 Hydroxy (Vit-D Deficiency, Fractures); Future - CBC with Differential/Platelet; Future  5. Fibromyalgia syndrome -Continue with current treatment - CBC with Differential/Platelet; Future  6. Gastroesophageal reflux disease, esophagitis presence not specified -No complaints today with reflux. - Hepatic function panel; Future - CBC with Differential/Platelet; Future  7. Tremor of left hand - Ambulatory referral to Neurology - Vitamin B12; Future - Thyroid Panel With TSH; Future - Sedimentation rate; Future - CBC with Differential/Platelet; Future  8. Neuropathy -This neuropathy seems to be more associated with hot and cold as sensation to diabetic check was normal. - Ambulatory referral to Neurology - Vitamin B12; Future - Thyroid Panel With TSH; Future - Sedimentation rate; Future - CBC with Differential/Platelet; Future  9. Solitary pulmonary nodule -Review of the patient's past chest x-ray in September and even back to a CT scan done in 2015 did not show any pulmonary nodules.  I do remember some  type of abnormality the patient has had in the past but could not find a written report regarding that abnormality.  We will look into that further. - CT Chest Wo Contrast; Future - CBC with Differential/Platelet; Future  Patient Instructions                       Medicare Annual Wellness Visit  [facility] and the medical providers at [facility] strive to bring you the best medical care.  In doing so we not only want to address your current medical conditions and concerns but also to detect new conditions early and prevent illness, disease and health-related problems.    Medicare offers a yearly Wellness Visit which allows our clinical staff to assess your need for preventative services including immunizations, lifestyle education, counseling to decrease risk of preventable diseases and screening for fall risk and other medical concerns.    This visit is provided free of charge (no copay) for all Medicare recipients. The clinical pharmacists at [facility] have begun to conduct these Wellness Visits which will also include a thorough review of all your medications.    As you primary medical provider recommend that you make an appointment for your Annual Wellness Visit if you have not done so already this year.  You may set up this appointment before you leave today or you may call back ([phone]) and  schedule an appointment.  Please make sure when you call that you mention that you are scheduling your Annual Wellness Visit with the clinical pharmacist so that the appointment may be made for the proper length of time.    Continue current medications. Continue good therapeutic lifestyle changes which include good diet and exercise. Fall precautions discussed with patient. If an FOBT was given today- please return it to our front desk. If you are over 6 years old - you may need Prevnar 14 or the adult Pneumonia vaccine.  **Flu shots are available---  please call and schedule a FLU-CLINIC appointment**  After your visit with [place] today you will receive a survey in the mail or online from [employer] regarding your care with [place]. Please take a moment to fill this out. Your feedback is very important to [place] as you can help [place] better understand your patient needs as well as improve your experience and satisfaction. WE CARE ABOUT YOU!!!   We will schedule visit for CT scan of the chest because of the abnormal chest x-ray that was found at [facility].  The previous x-ray here in September was normal. We will also schedule her for a visit with Dr. Henrene Pastor who can plan her future colonoscopy when she completes her treatment with the ophthalmologist at [facility]. We will also schedule her to see the neurologist because of the new onset tremor in the left hand and diminished sensation in both feet that have been going on for the past 1 to 2 months.   Arrie Senate MD

## 2018-05-18 ENCOUNTER — Other Ambulatory Visit: Payer: Medicare Other

## 2018-05-18 DIAGNOSIS — E1169 Type 2 diabetes mellitus with other specified complication: Secondary | ICD-10-CM | POA: Diagnosis not present

## 2018-05-18 DIAGNOSIS — R251 Tremor, unspecified: Secondary | ICD-10-CM

## 2018-05-18 DIAGNOSIS — E1149 Type 2 diabetes mellitus with other diabetic neurological complication: Secondary | ICD-10-CM

## 2018-05-18 DIAGNOSIS — K219 Gastro-esophageal reflux disease without esophagitis: Secondary | ICD-10-CM

## 2018-05-18 DIAGNOSIS — I10 Essential (primary) hypertension: Secondary | ICD-10-CM

## 2018-05-18 DIAGNOSIS — E785 Hyperlipidemia, unspecified: Secondary | ICD-10-CM | POA: Diagnosis not present

## 2018-05-18 DIAGNOSIS — G629 Polyneuropathy, unspecified: Secondary | ICD-10-CM

## 2018-05-18 DIAGNOSIS — M797 Fibromyalgia: Secondary | ICD-10-CM

## 2018-05-18 DIAGNOSIS — R911 Solitary pulmonary nodule: Secondary | ICD-10-CM

## 2018-05-18 DIAGNOSIS — E559 Vitamin D deficiency, unspecified: Secondary | ICD-10-CM

## 2018-05-18 LAB — BAYER DCA HB A1C WAIVED: HB A1C (BAYER DCA - WAIVED): 7.1 % — ABNORMAL HIGH (ref <7.0)

## 2018-05-19 LAB — HEPATIC FUNCTION PANEL
ALT: 20 IU/L (ref 0–32)
AST: 14 IU/L (ref 0–40)
Albumin: 4 g/dL (ref 3.7–4.7)
Alkaline Phosphatase: 87 IU/L (ref 39–117)
Bilirubin Total: 0.3 mg/dL (ref 0.0–1.2)
Bilirubin, Direct: 0.09 mg/dL (ref 0.00–0.40)
Total Protein: 6.6 g/dL (ref 6.0–8.5)

## 2018-05-19 LAB — BMP8+EGFR
BUN/Creatinine Ratio: 26 (ref 12–28)
BUN: 19 mg/dL (ref 8–27)
CO2: 26 mmol/L (ref 20–29)
Calcium: 9.3 mg/dL (ref 8.7–10.3)
Chloride: 94 mmol/L — ABNORMAL LOW (ref 96–106)
Creatinine, Ser: 0.72 mg/dL (ref 0.57–1.00)
GFR calc Af Amer: 97 mL/min/{1.73_m2} (ref >59)
GFR calc non Af Amer: 84 mL/min/{1.73_m2} (ref >59)
Glucose: 104 mg/dL — ABNORMAL HIGH (ref 65–99)
Potassium: 4.3 mmol/L (ref 3.5–5.2)
Sodium: 135 mmol/L (ref 134–144)

## 2018-05-19 LAB — LIPID PANEL
Chol/HDL Ratio: 2 ratio (ref 0.0–4.4)
Cholesterol, Total: 188 mg/dL (ref 100–199)
HDL: 94 mg/dL (ref >39)
LDL Calculated: 68 mg/dL (ref 0–99)
Triglycerides: 129 mg/dL (ref 0–149)
VLDL Cholesterol Cal: 26 mg/dL (ref 5–40)

## 2018-05-19 LAB — CBC WITH DIFFERENTIAL/PLATELET
Basophils Absolute: 0 10*3/uL (ref 0.0–0.2)
Basos: 0 %
EOS (ABSOLUTE): 0.2 10*3/uL (ref 0.0–0.4)
Eos: 1 %
Hematocrit: 35.3 % (ref 34.0–46.6)
Hemoglobin: 11.6 g/dL (ref 11.1–15.9)
Immature Grans (Abs): 0.2 10*3/uL — ABNORMAL HIGH (ref 0.0–0.1)
Immature Granulocytes: 1 %
Lymphocytes Absolute: 6 10*3/uL — ABNORMAL HIGH (ref 0.7–3.1)
Lymphs: 39 %
MCH: 27.6 pg (ref 26.6–33.0)
MCHC: 32.9 g/dL (ref 31.5–35.7)
MCV: 84 fL (ref 79–97)
Monocytes Absolute: 1.4 10*3/uL — ABNORMAL HIGH (ref 0.1–0.9)
Monocytes: 9 %
Neutrophils Absolute: 7.7 10*3/uL — ABNORMAL HIGH (ref 1.4–7.0)
Neutrophils: 50 %
Platelets: 354 10*3/uL (ref 150–450)
RBC: 4.2 x10E6/uL (ref 3.77–5.28)
RDW: 14.2 % (ref 11.7–15.4)
WBC: 15.5 10*3/uL — ABNORMAL HIGH (ref 3.4–10.8)

## 2018-05-19 LAB — THYROID PANEL WITH TSH
Free Thyroxine Index: 1.7 (ref 1.2–4.9)
T3 Uptake Ratio: 25 % (ref 24–39)
T4, Total: 6.7 ug/dL (ref 4.5–12.0)
TSH: 3.22 u[IU]/mL (ref 0.450–4.500)

## 2018-05-19 LAB — SEDIMENTATION RATE: Sed Rate: 24 mm/hr (ref 0–40)

## 2018-05-19 LAB — VITAMIN D 25 HYDROXY (VIT D DEFICIENCY, FRACTURES): Vit D, 25-Hydroxy: 34.8 ng/mL (ref 30.0–100.0)

## 2018-05-19 LAB — VITAMIN B12: Vitamin B-12: 460 pg/mL (ref 232–1245)

## 2018-05-20 ENCOUNTER — Telehealth: Payer: Self-pay | Admitting: Family Medicine

## 2018-05-20 NOTE — Telephone Encounter (Signed)
Patient aware of results.

## 2018-05-25 ENCOUNTER — Other Ambulatory Visit: Payer: Self-pay | Admitting: Family Medicine

## 2018-06-02 ENCOUNTER — Ambulatory Visit (HOSPITAL_COMMUNITY)
Admission: RE | Admit: 2018-06-02 | Discharge: 2018-06-02 | Disposition: A | Discharge status: Home / Self Care | Payer: Medicare Other | Source: Ambulatory Visit | Attending: Family Medicine | Admitting: Family Medicine

## 2018-06-02 DIAGNOSIS — R918 Other nonspecific abnormal finding of lung field: Secondary | ICD-10-CM | POA: Diagnosis not present

## 2018-06-02 DIAGNOSIS — R911 Solitary pulmonary nodule: Secondary | ICD-10-CM | POA: Diagnosis not present

## 2018-06-08 ENCOUNTER — Encounter: Payer: Self-pay | Admitting: Internal Medicine

## 2018-06-08 ENCOUNTER — Telehealth: Payer: Self-pay | Admitting: Family Medicine

## 2018-06-08 NOTE — Telephone Encounter (Signed)
Aware. 

## 2018-06-13 ENCOUNTER — Encounter: Payer: Self-pay | Admitting: Family Medicine

## 2018-06-13 ENCOUNTER — Ambulatory Visit: Payer: Medicare Other | Admitting: Family Medicine

## 2018-06-13 ENCOUNTER — Other Ambulatory Visit: Payer: Self-pay | Admitting: *Deleted

## 2018-06-13 VITALS — BP 115/69 | HR 89 | Temp 97.2°F | Ht 66.0 in | Wt 200.0 lb

## 2018-06-13 DIAGNOSIS — N898 Other specified noninflammatory disorders of vagina: Secondary | ICD-10-CM | POA: Diagnosis not present

## 2018-06-13 DIAGNOSIS — E1149 Type 2 diabetes mellitus with other diabetic neurological complication: Secondary | ICD-10-CM | POA: Diagnosis not present

## 2018-06-13 LAB — MICROSCOPIC EXAMINATION
Epithelial Cells (non renal): >10 /hpf — AB (ref 0–10)
Renal Epithel, UA: NONE SEEN /hpf

## 2018-06-13 LAB — WET PREP FOR TRICH, YEAST, CLUE
Clue Cell Exam: POSITIVE — AB
Trichomonas Exam: NEGATIVE
Yeast Exam: NEGATIVE

## 2018-06-13 LAB — URINALYSIS, COMPLETE
Bilirubin, UA: NEGATIVE
Glucose, UA: NEGATIVE
Ketones, UA: NEGATIVE
Leukocytes, UA: NEGATIVE
Nitrite, UA: NEGATIVE
Protein, UA: NEGATIVE
RBC, UA: NEGATIVE
Specific Gravity, UA: 1.025 (ref 1.005–1.030)
UUROB: 0.2 mg/dL (ref 0.2–1.0)
pH, UA: 5.5 (ref 5.0–7.5)

## 2018-06-13 LAB — GLUCOSE HEMOCUE WAIVED: Glu Hemocue Waived: 178 mg/dL — ABNORMAL HIGH (ref 65–99)

## 2018-06-13 MED ORDER — FLUCONAZOLE 150 MG PO TABS: 150.0000 mg | ORAL_TABLET | Freq: Every day | ORAL | 0 refills | Status: DC

## 2018-06-13 MED ORDER — METRONIDAZOLE 0.75 % VA GEL: 1.0000 | Freq: Every day | VAGINAL | 0 refills | Status: DC

## 2018-06-13 NOTE — Patient Instructions (Signed)
Continue to follow-up with orthopedic surgeon Take Diflucan as directed Watch diet closely and monitor blood sugars closely Take extra metformin at bedtime and return to this office in about 10 days to visit with the clinical pharmacist with home blood sugars. If blood sugars start running in the low 100s, discontinue metformin at bedtime. Drink plenty of water and fluids

## 2018-06-13 NOTE — Progress Notes (Signed)
Subjective:    [name], female    DOB: November 26, 1945, 73 y.o.   MRN: [id number]  HPI Patient here today for recent elevation in blood sugars.  She has had more trouble with her blood sugars and for the last 2 weeks the blood sugar is been running in the 300-3 20 range.  This morning it was 217.  She has been on a course of prednisone and has now been off of that for 1 week.  She has taken 1000 total of metformin XL daily and she is on Ozempic 1 mg weekly.  She also questions whether she may have a yeast infection she complains of some dizziness.  The initial vital signs are good.  He denies any chest pain pressure tightness or shortness of breath.  No gastrointestinal symptoms.  Just some burning and itching when she passes her water.   Patient Active Problem List   Diagnosis Date Noted  . 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 Aortic atherosclerosis ([place]) 12/08/2017  . 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 Depression 02/28/2013  . GERD (gastroesophageal reflux disease) 02/28/2013  . Hiatal hernia with gastroesophageal reflux 02/28/2013  . Peripheral neuropathy 02/28/2013  . DDD (degenerative disc disease), lumbosacral 06/23/2012  . Fibromyalgia syndrome 06/23/2012  . Essential hypertension, benign 06/23/2012  . Osteopenia 06/23/2012  . Diabetes ([place]) 06/23/2012  . Abnormal EKG 10/29/2010  . Obesity due to excess calories 10/29/2010  . 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 DYSPHAGIA 10/11/2009  . PERSONAL HISTORY OF COLONIC POLYPS 10/11/2009   Outpatient Encounter Medications as of 06/13/2018  Medication Sig  . ALPRAZolam (XANAX) 0.5 MG tablet Take 1-2 tabs daily as needed (Patient taking differently: Take 0.5 mg by mouth at bedtime. )  . ARIPiprazole (ABILIFY) 2 MG tablet Take 1 tablet (2 mg total) by mouth daily. (Patient taking differently: Take 1 mg by mouth at bedtime. )  . Biotin 1000 MCG tablet Take 1,000 mcg by mouth 3 (three)  times daily.  . blood glucose meter kit and supplies KIT Dispense based on patient and insurance preference. Use up to two times daily as directed. (Dx: type 2 DM - E11.9)  . Blood Glucose Monitoring Suppl DEVI Blood glucose meter One Touch Brand. Use to check BS up to BID. DX 250.02  . cholecalciferol (VITAMIN D) 1000 UNITS tablet Take 1,000 Units by mouth daily.   Marland Kitchen escitalopram (LEXAPRO) 20 MG tablet Take 1 tablet (20 mg total) by mouth daily.  Marland Kitchen esomeprazole (NEXIUM) 40 MG capsule TAKE  (1)  CAPSULE  TWICE DAILY. (Patient taking differently: Take 40 mg by mouth daily. )  . ezetimibe (ZETIA) 10 MG tablet Take 1 tablet (10 mg total) by mouth daily. (Patient taking differently: Take 5 mg by mouth daily. )  . gabapentin (NEURONTIN) 600 MG tablet Take 1 tablet (600 mg total) by mouth 4 (four) times daily. (Patient taking differently: Take 600-1,200 mg by mouth See admin instructions. Take 600 mg by mouth in the morning, take 600 mg by mouth in the afternoon and take 1200 mg by mouth at bedtime)  . glucose blood (ONE TOUCH ULTRA TEST) test strip CHECK BLOOD SUGAR UP TO 2 TIMES A DAY  . glucose monitoring kit (FREESTYLE) monitoring kit Please dispense whichever glucometer patient's insurance will cover.  Use to check BG up to twice.  DX: 250.02  . lisinopril-hydrochlorothiazide (PRINZIDE,ZESTORETIC) 20-25 MG tablet Take 1 tablet by mouth daily.  . metFORMIN (GLUCOPHAGE-XR) 500 MG 24  hr tablet Take 2 tablets (1,000 mg total) by mouth daily with breakfast.  . Misc Natural Products (LUTEIN 20 PO) Take by mouth.  . Omega-3 Fatty Acids (FISH OIL) 1000 MG CAPS Take 2,000 mg by mouth daily.   [name] DELICA LANCETS 65K MISC CHECK BLOOD SUGAR UP TO TWICE DAILY  . OZEMPIC, 1 MG/DOSE, 2 MG/1.5ML SOPN Inject 1 mg as directed once a week.  . Pitavastatin Calcium (LIVALO) 2 MG TABS TAKE  (1)  TABLET  EVERY OTHER DAY. (Patient taking differently: Take 2 mg by mouth every Monday, Wednesday, and Friday. )  .  PREMARIN vaginal cream Place 1 Applicatorful vaginally at bedtime as needed. (Patient taking differently: Place 1 Applicatorful vaginally at bedtime as needed (for dryness). )  . [DISCONTINUED] HYDROmorphone (DILAUDID) 4 MG tablet Take 1 tablet (4 mg total) by mouth every 6 (six) hours as needed for moderate pain.  . [DISCONTINUED] methocarbamol (ROBAXIN) 500 MG tablet Take 1 tablet (500 mg total) by mouth every 6 (six) hours as needed for muscle spasms.  . [DISCONTINUED] predniSONE (DELTASONE) 10 MG tablet Take 4 tablets by mouth daily.   No facility-administered encounter medications on file as of 06/13/2018.       Review of Systems  Constitutional: Negative.   HENT: Negative.   Eyes: Negative.   Respiratory: Negative.   Cardiovascular: Negative.   Gastrointestinal: Negative.   Endocrine: Negative.        Elevated fasting BS   Genitourinary: Positive for vaginal discharge ("yeast" ). Negative for flank pain.  Musculoskeletal: Negative.   Skin: Negative.   Allergic/Immunologic: Negative.   Neurological: Positive for dizziness.  Hematological: Negative.   Psychiatric/Behavioral: Negative.        Objective:   Physical Exam Vitals signs and nursing note reviewed.  Constitutional:      General: She is not in acute distress.    Appearance: Normal appearance. She is well-developed. She is obese.     Comments: Is pleasant but worried about just not feeling well and her blood sugars running higher.  As already mentioned this morning the blood sugar at home was 217.  HENT:     Head: Normocephalic and atraumatic.     Right Ear: Tympanic membrane, ear canal and external ear normal. There is no impacted cerumen.     Left Ear: Tympanic membrane, ear canal and external ear normal. There is no impacted cerumen.     Nose: Nose normal. No congestion.     Mouth/Throat:     Mouth: Mucous membranes are moist.     Pharynx: Oropharynx is clear. No oropharyngeal exudate or posterior oropharyngeal  erythema.  Eyes:     General: No scleral icterus.       Right eye: No discharge.        Left eye: No discharge.     Extraocular Movements: Extraocular movements intact.     Conjunctiva/sclera: Conjunctivae normal.     Pupils: Pupils are equal, round, and reactive to light.  Neck:     Musculoskeletal: Normal range of motion and neck supple.     Thyroid: No thyromegaly.     Vascular: No JVD.  Cardiovascular:     Rate and Rhythm: Normal rate and regular rhythm.     Heart sounds: Normal heart sounds. No murmur. No friction rub. No gallop.      Comments: Heart is regular at 84/min Pulmonary:     Effort: Pulmonary effort is normal.     Breath sounds: Normal breath sounds.  No wheezing or rales.  Abdominal:     General: Bowel sounds are normal.     Palpations: Abdomen is soft. There is no mass.     Tenderness: There is abdominal tenderness. There is no guarding or rebound.     Comments: Slight suprapubic tenderness  Musculoskeletal:        General: No tenderness.     Comments: Range of motion of lower extremity is limited due to recent knee surgery.  Lymphadenopathy:     Cervical: No cervical adenopathy.  Skin:    General: Skin is warm and dry.     Findings: No rash.  Neurological:     General: No focal deficit present.     Mental Status: She is alert and oriented to person, place, and time. Mental status is at baseline.     Deep Tendon Reflexes: Reflexes are normal and symmetric.  Psychiatric:        Mood and Affect: Mood normal.        Behavior: Behavior normal.        Thought Content: Thought content normal.        Judgment: Judgment normal.    BP 115/69 (BP Location: Left Arm)   Pulse 89   Temp (!) 97.2 F (36.2 C) (Oral)   Ht 5' 6"  (1.676 m)   Wt 200 lb (90.7 kg)   BMI 32.28 kg/m         Assessment & Plan:  1. Type 2 diabetes mellitus with other neurologic complication, without long-term current use of insulin ([facility]) - Glucose Hemocue Waived -Monitor blood  sugars closely and visit with diabetic educator on Wednesday week with home blood sugars -Discontinue metformin at bedtime if blood sugars start running in the low 100 range. -Call back if any questions.  2. Vaginal discharge -Take Diflucan as directed - Urinalysis, Complete - WET PREP FOR TRICH, YEAST, CLUE  Meds ordered this encounter  Medications  . fluconazole (DIFLUCAN) 150 MG tablet    Sig: Take 1 tablet (150 mg total) by mouth daily.    Dispense:  3 tablet    Refill:  0   Patient Instructions  Continue to follow-up with orthopedic surgeon Take Diflucan as directed Watch diet closely and monitor blood sugars closely Take extra metformin at bedtime and return to this office in about 10 days to visit with the clinical pharmacist with home blood sugars. If blood sugars start running in the low 100s, discontinue metformin at bedtime. Drink plenty of water and fluids  Arrie Senate MD

## 2018-06-14 DIAGNOSIS — H353131 Nonexudative age-related macular degeneration, bilateral, early dry stage: Secondary | ICD-10-CM | POA: Diagnosis not present

## 2018-06-14 DIAGNOSIS — H3500 Unspecified background retinopathy: Secondary | ICD-10-CM | POA: Diagnosis not present

## 2018-06-14 DIAGNOSIS — H47012 Ischemic optic neuropathy, left eye: Secondary | ICD-10-CM | POA: Diagnosis not present

## 2018-06-14 DIAGNOSIS — Z7952 Long term (current) use of systemic steroids: Secondary | ICD-10-CM | POA: Diagnosis not present

## 2018-06-14 DIAGNOSIS — H349 Unspecified retinal vascular occlusion: Secondary | ICD-10-CM | POA: Diagnosis not present

## 2018-06-14 DIAGNOSIS — Z96659 Presence of unspecified artificial knee joint: Secondary | ICD-10-CM | POA: Diagnosis not present

## 2018-06-23 ENCOUNTER — Other Ambulatory Visit: Payer: Self-pay | Admitting: Family Medicine

## 2018-06-29 ENCOUNTER — Other Ambulatory Visit: Payer: Self-pay

## 2018-06-29 ENCOUNTER — Telehealth: Payer: Medicare Other | Admitting: Pharmacist Clinician (PhC)/ Clinical Pharmacy Specialist

## 2018-07-06 ENCOUNTER — Ambulatory Visit: Payer: Medicare Other | Admitting: Neurology

## 2018-07-11 ENCOUNTER — Encounter: Payer: Medicare Other | Admitting: Internal Medicine

## 2018-07-18 ENCOUNTER — Other Ambulatory Visit: Payer: Self-pay | Admitting: Family Medicine

## 2018-07-18 NOTE — Telephone Encounter (Signed)
Patient notified that refill was sent in.

## 2018-08-18 DIAGNOSIS — Z96651 Presence of right artificial knee joint: Secondary | ICD-10-CM | POA: Diagnosis not present

## 2018-08-18 DIAGNOSIS — Z471 Aftercare following joint replacement surgery: Secondary | ICD-10-CM | POA: Diagnosis not present

## 2018-08-18 DIAGNOSIS — T84092D Other mechanical complication of internal right knee prosthesis, subsequent encounter: Secondary | ICD-10-CM | POA: Diagnosis not present

## 2018-08-23 ENCOUNTER — Other Ambulatory Visit: Payer: Self-pay | Admitting: Family Medicine

## 2018-08-23 DIAGNOSIS — G894 Chronic pain syndrome: Secondary | ICD-10-CM | POA: Diagnosis not present

## 2018-08-25 ENCOUNTER — Other Ambulatory Visit: Payer: Self-pay | Admitting: *Deleted

## 2018-08-25 MED ORDER — ALPRAZOLAM 0.5 MG PO TABS: ORAL_TABLET | ORAL | 1 refills | Status: DC

## 2018-09-06 DIAGNOSIS — Z1231 Encounter for screening mammogram for malignant neoplasm of breast: Secondary | ICD-10-CM | POA: Diagnosis not present

## 2018-09-07 NOTE — Progress Notes (Signed)
06/03/2018- noted in Epic- CT chest wo contrast  02/25/2018- noted in Epic-EKG  12/08/2017- noted in Epic- CXR 2 view

## 2018-09-07 NOTE — Patient Instructions (Addendum)
[name]  09/07/2018   Your procedure is scheduled on: Monday 09/12/2018  Report to [facility] Main  Entrance              Report to admitting at   [place] 19 TEST Today, September 08, 2018, THIS TEST MUST BE DONE BEFORE SURGERY, COME TO [place].    CHow to Manage Your Diabetes Before and After Surgery  Why is it important to control my blood sugar before and after surgery? . Improving blood sugar levels before and after surgery helps healing and can limit problems. . A way of improving blood sugar control is eating a healthy diet by: o  Eating less sugar and carbohydrates o  Increasing activity/exercise o  Talking with your doctor about reaching your blood sugar goals . High blood sugars (greater than 180 mg/dL) can raise your risk of infections and slow your recovery, so you will need to focus on controlling your diabetes during the weeks before surgery. . Make sure that the doctor who takes care of your diabetes knows about your planned surgery including the date and location.  How do I manage my blood sugar before surgery? . Check your blood sugar at least 4 times a day, starting 2 days before surgery, to make sure that the level is not too high or low. o Check your blood sugar the morning of your surgery when you wake up and every 2 hours until you get to the Short Stay unit. . If your blood sugar is less than 70 mg/dL, you will need to treat for low blood sugar: o Do not take insulin. o Treat a low blood sugar (less than 70 mg/dL) with  cup of clear juice (cranberry or apple), 4 glucose tablets, OR glucose gel. o Recheck blood sugar in 15 minutes after treatment (to make sure it is greater than 70 mg/dL). If your blood sugar is not greater than 70 mg/dL on recheck, call [phone] for further instructions. . Report your blood sugar to the short stay nurse when you get to Short  Stay.  . If you are admitted to the hospital after surgery: o Your blood sugar will be checked by the staff and you will probably be given insulin after surgery (instead of oral diabetes medicines) to make sure you have good blood sugar levels. o The goal for blood sugar control after surgery is 80-180 mg/dL.   WHAT DO I DO ABOUT MY DIABETES MEDICATION?         The day before surgery, Take Metformin as usual.  . Do not take oral diabetes medicines (pills) the morning of surgery.                 Call this number if you have problems the morning of surgery [phone]    Remember: Do not eat food  :After Midnight.              NO SOLID FOOD AFTER MIDNIGHT THE NIGHT PRIOR TO SURGERY. NOTHING BY MOUTH EXCEPT CLEAR LIQUIDS UNTIL   0850 am.              PLEASE FINISH Gatorade G2  DRINK PER SURGEON ORDER  WHICH NEEDS TO BE COMPLETED AT  0430 am.   CLEAR  LIQUID DIET   Foods Allowed                                                                     Foods Excluded  Coffee and tea, regular and decaf                             liquids that you cannot  Plain Jell-O in any flavor                                             see through such as: Fruit ices (not with fruit pulp)                                     milk, soups, orange juice  Iced Popsicles                                    All solid food Carbonated beverages, regular and diet                                    Cranberry, grape and apple juices Sports drinks like Gatorade Lightly seasoned clear broth or consume(fat free) Sugar, honey syrup  Sample Menu Breakfast                                Lunch                                     Supper Cranberry juice                    Beef broth                            Chicken broth Jell-O                                     Grape juice                           Apple juice Coffee or tea                        Jell-O                                      Popsicle  Coffee or tea                        Coffee or tea  _____________________________________________________________________               BRUSH YOUR TEETH MORNING OF SURGERY AND RINSE YOUR MOUTH OUT, NO CHEWING GUM CANDY OR MINTS.     Take these medicines the morning of surgery with A SIP OF WATER:  Escitalopram (Lexapro), Esomeprazole (Nexium), Gabapentin (Neurontin)             DO NOT TAKE ANY DIABETIC MEDICATIONS DAY OF YOUR SURGERY!                               You may not have any metal on your body including hair pins and              piercings  Do not wear jewelry, make-up, lotions, powders or perfumes, deodorant             Do not wear nail polish.  Do not shave  48 hours prior to surgery.            Do not bring valuables to the hospital. [place].  Contacts, dentures or bridgework may not be worn into surgery.  Leave suitcase in the car. After surgery it may be brought to your room.     Patients discharged the day of surgery will not be allowed to drive home. IF YOU ARE HAVING SURGERY AND GOING HOME THE SAME DAY, YOU MUST HAVE AN ADULT TO DRIVE YOU HOME AND BE WITH YOU FOR 24 HOURS. YOU MAY GO HOME BY TAXI OR UBER OR ORTHERWISE, BUT AN ADULT MUST ACCOMPANY YOU HOME AND STAY WITH YOU FOR 24 HOURS.  Name and phone number of your driver:                Please read over the following fact sheets you were given: _____________________________________________________________________             [place] - Preparing for Surgery Before surgery, you can play an important role.  Because skin is not sterile, your skin needs to be as free of germs as possible.  You can reduce the number of germs on your skin by washing with CHG (chlorahexidine gluconate) soap before surgery.  CHG is an antiseptic cleaner which kills germs and bonds with the skin to continue killing germs even after washing. Please DO  NOT use if you have an allergy to CHG or antibacterial soaps.  If your skin becomes reddened/irritated stop using the CHG and inform your nurse when you arrive at Short Stay. Do not shave (including legs and underarms) for at least 48 hours prior to the first CHG shower.  You may shave your face/neck. Please follow these instructions carefully:  1.  Shower with CHG Soap the night before surgery and the  morning of Surgery.  2.  If you choose to wash your hair, wash your hair first as usual with your  normal  shampoo.  3.  After you shampoo, rinse your hair and body thoroughly to remove the  shampoo.                           4.  Use CHG as you would any other liquid soap.  You can apply chg directly  to the skin and wash                       Gently with a scrungie or clean washcloth.  5.  Apply the CHG Soap to your body ONLY FROM THE NECK DOWN.   Do not use on face/ open                           Wound or open sores. Avoid contact with eyes, ears mouth and genitals (private parts).                       Wash face,  Genitals (private parts) with your normal soap.             6.  Wash thoroughly, paying special attention to the area where your surgery  will be performed.  7.  Thoroughly rinse your body with warm water from the neck down.  8.  DO NOT shower/wash with your normal soap after using and rinsing off  the CHG Soap.                9.  Pat yourself dry with a clean towel.            10.  Wear clean pajamas.            11.  Place clean sheets on your bed the night of your first shower and do not  sleep with pets. Day of Surgery : Do not apply any lotions/deodorants the morning of surgery.  Please wear clean clothes to the hospital/surgery center.  FAILURE TO FOLLOW THESE INSTRUCTIONS MAY RESULT IN THE CANCELLATION OF YOUR SURGERY PATIENT SIGNATURE_________________________________  NURSE  SIGNATURE__________________________________  ________________________________________________________________________   Adam Phenix  An incentive spirometer is a tool that can help keep your lungs clear and active. This tool measures how well you are filling your lungs with each breath. Taking long deep breaths may help reverse or decrease the chance of developing breathing (pulmonary) problems (especially infection) following:  A long period of time when you are unable to move or be active. BEFORE THE PROCEDURE   If the spirometer includes an indicator to show your best effort, your nurse or respiratory therapist will set it to a desired goal.  If possible, sit up straight or lean slightly forward. Try not to slouch.  Hold the incentive spirometer in an upright position. INSTRUCTIONS FOR USE  1. Sit on the edge of your bed if possible, or sit up as far as you can in bed or on a chair. 2. Hold the incentive spirometer in an upright position. 3. Breathe out normally. 4. Place the mouthpiece in your mouth and seal your lips tightly around it. 5. Breathe in slowly and as deeply as possible, raising the piston or the ball toward the top of the column. 6. Hold your breath for 3-5 seconds or for as long as possible. Allow the piston or ball to fall to the bottom of the column. 7. Remove the mouthpiece from your mouth and breathe out normally. 8. Rest for a few seconds and repeat Steps 1 through 7 at least 10 times every 1-2 hours when you are awake. Take your time and take a few normal breaths between deep breaths. 9. The spirometer may include an indicator to show your best  effort. Use the indicator as a goal to work toward during each repetition. 10. After each set of 10 deep breaths, practice coughing to be sure your lungs are clear. If you have an incision (the cut made at the time of surgery), support your incision when coughing by placing a pillow or rolled up towels firmly  against it. Once you are able to get out of bed, walk around indoors and cough well. You may stop using the incentive spirometer when instructed by your caregiver.  RISKS AND COMPLICATIONS  Take your time so you do not get dizzy or light-headed.  If you are in pain, you may need to take or ask for pain medication before doing incentive spirometry. It is harder to take a deep breath if you are having pain. AFTER USE  Rest and breathe slowly and easily.  It can be helpful to keep track of a log of your progress. Your caregiver can provide you with a simple table to help with this. If you are using the spirometer at home, follow these instructions: [place] IF:   You are having difficultly using the spirometer.  You have trouble using the spirometer as often as instructed.  Your pain medication is not giving enough relief while using the spirometer.  You develop fever of 100.5 F (38.1 C) or higher. SEEK IMMEDIATE MEDICAL CARE IF:   You cough up bloody sputum that had not been present before.  You develop fever of 102 F (38.9 C) or greater.  You develop worsening pain at or near the incision site. MAKE SURE YOU:   Understand these instructions.  Will watch your condition.  Will get help right away if you are not doing well or get worse. Document Released: 08/03/2006 Document Revised: 06/15/2011 Document Reviewed: 10/04/2006 [facility] Patient Information 2014 [place], [place].   ________________________________________________________________________

## 2018-09-08 ENCOUNTER — Encounter (HOSPITAL_COMMUNITY): Payer: Self-pay

## 2018-09-08 ENCOUNTER — Other Ambulatory Visit: Payer: Self-pay

## 2018-09-08 ENCOUNTER — Other Ambulatory Visit (HOSPITAL_COMMUNITY)
Admission: RE | Admit: 2018-09-08 | Discharge: 2018-09-08 | Disposition: A | Discharge status: Home / Self Care | Payer: Medicare Other | Source: Ambulatory Visit | Attending: Orthopedic Surgery | Admitting: Orthopedic Surgery

## 2018-09-08 ENCOUNTER — Encounter (HOSPITAL_COMMUNITY)
Admission: RE | Admit: 2018-09-08 | Discharge: 2018-09-08 | Disposition: A | Discharge status: Home / Self Care | Payer: Medicare Other | Source: Ambulatory Visit | Attending: Orthopedic Surgery | Admitting: Orthopedic Surgery

## 2018-09-08 DIAGNOSIS — Z1159 Encounter for screening for other viral diseases: Secondary | ICD-10-CM | POA: Diagnosis not present

## 2018-09-08 DIAGNOSIS — Z01812 Encounter for preprocedural laboratory examination: Secondary | ICD-10-CM | POA: Insufficient documentation

## 2018-09-08 HISTORY — DX: Type 2 diabetes mellitus with diabetic neuropathy, unspecified: E11.40

## 2018-09-08 HISTORY — DX: Unspecified background retinopathy: H35.00

## 2018-09-08 LAB — CBC
HCT: 40.1 % (ref 36.0–46.0)
Hemoglobin: 12.4 g/dL (ref 12.0–15.0)
MCH: 27.1 pg (ref 26.0–34.0)
MCHC: 30.9 g/dL (ref 30.0–36.0)
MCV: 87.6 fL (ref 80.0–100.0)
Platelets: 294 10*3/uL (ref 150–400)
RBC: 4.58 MIL/uL (ref 3.87–5.11)
RDW: 15.1 % (ref 11.5–15.5)
WBC: 8.2 10*3/uL (ref 4.0–10.5)
nRBC: 0 % (ref 0.0–0.2)

## 2018-09-08 LAB — BASIC METABOLIC PANEL
Anion gap: 10 (ref 5–15)
BUN: 9 mg/dL (ref 8–23)
CO2: 27 mmol/L (ref 22–32)
Calcium: 9.5 mg/dL (ref 8.9–10.3)
Chloride: 97 mmol/L — ABNORMAL LOW (ref 98–111)
Creatinine, Ser: 0.66 mg/dL (ref 0.44–1.00)
GFR calc Af Amer: >60 mL/min (ref >60)
GFR calc non Af Amer: >60 mL/min (ref >60)
Glucose, Bld: 113 mg/dL — ABNORMAL HIGH (ref 70–99)
Potassium: 4.5 mmol/L (ref 3.5–5.1)
Sodium: 134 mmol/L — ABNORMAL LOW (ref 135–145)

## 2018-09-08 LAB — HEMOGLOBIN A1C
Hgb A1c MFr Bld: 6.9 % — ABNORMAL HIGH (ref 4.8–5.6)
Mean Plasma Glucose: 151.33 mg/dL

## 2018-09-08 LAB — GLUCOSE, CAPILLARY: Glucose-Capillary: 102 mg/dL — ABNORMAL HIGH (ref 70–99)

## 2018-09-09 LAB — NOVEL CORONAVIRUS, NAA (HOSP ORDER, SEND-OUT TO REF LAB; TAT 18-24 HRS): SARS-CoV-2, NAA: NOT DETECTED

## 2018-09-09 NOTE — Progress Notes (Addendum)
SPOKE W/   Pt. [name]   SCREENING SYMPTOMS OF COVID 19:   COUGH--no  RUNNY NOSE--- no  SORE THROAT---no  NASAL CONGESTION----no  SNEEZING----no  SHORTNESS OF BREATH---no  DIFFICULTY BREATHING---no  TEMP >100.0 -----no  UNEXPLAINED BODY ACHES------no  CHILLS -------- no  HEADACHES ---------no  LOSS OF SMELL/ TASTE --------no    HAVE YOU OR ANY FAMILY MEMBER TRAVELLED PAST 14 DAYS OUT OF THE   COUNTY---no ( Pt lives in [place]) STATE----no COUNTRY----no  HAVE YOU OR ANY FAMILY MEMBER BEEN EXPOSED TO ANYONE WITH COVID 19?   NO

## 2018-09-12 ENCOUNTER — Encounter (HOSPITAL_COMMUNITY)
Admission: RE | Disposition: A | Discharge status: Home / Self Care | Payer: Self-pay | Source: Home / Self Care | Attending: Orthopedic Surgery

## 2018-09-12 ENCOUNTER — Ambulatory Visit (HOSPITAL_COMMUNITY): Payer: Medicare Other | Admitting: Physician Assistant

## 2018-09-12 ENCOUNTER — Other Ambulatory Visit: Payer: Self-pay

## 2018-09-12 ENCOUNTER — Encounter (HOSPITAL_COMMUNITY): Payer: Self-pay | Admitting: *Deleted

## 2018-09-12 ENCOUNTER — Ambulatory Visit (HOSPITAL_COMMUNITY): Payer: Medicare Other | Admitting: Anesthesiology

## 2018-09-12 ENCOUNTER — Ambulatory Visit (HOSPITAL_COMMUNITY)
Admission: RE | Admit: 2018-09-12 | Discharge: 2018-09-12 | Disposition: A | Discharge status: Home / Self Care | Payer: Medicare Other | Attending: Orthopedic Surgery | Admitting: Orthopedic Surgery

## 2018-09-12 DIAGNOSIS — M25869 Other specified joint disorders, unspecified knee: Secondary | ICD-10-CM | POA: Diagnosis not present

## 2018-09-12 DIAGNOSIS — Z7984 Long term (current) use of oral hypoglycemic drugs: Secondary | ICD-10-CM | POA: Insufficient documentation

## 2018-09-12 DIAGNOSIS — Z86010 Personal history of colonic polyps: Secondary | ICD-10-CM | POA: Insufficient documentation

## 2018-09-12 DIAGNOSIS — Z90710 Acquired absence of both cervix and uterus: Secondary | ICD-10-CM | POA: Insufficient documentation

## 2018-09-12 DIAGNOSIS — E785 Hyperlipidemia, unspecified: Secondary | ICD-10-CM | POA: Insufficient documentation

## 2018-09-12 DIAGNOSIS — Z87891 Personal history of nicotine dependence: Secondary | ICD-10-CM | POA: Insufficient documentation

## 2018-09-12 DIAGNOSIS — Z79899 Other long term (current) drug therapy: Secondary | ICD-10-CM | POA: Diagnosis not present

## 2018-09-12 DIAGNOSIS — E1140 Type 2 diabetes mellitus with diabetic neuropathy, unspecified: Secondary | ICD-10-CM | POA: Insufficient documentation

## 2018-09-12 DIAGNOSIS — E11610 Type 2 diabetes mellitus with diabetic neuropathic arthropathy: Secondary | ICD-10-CM | POA: Diagnosis not present

## 2018-09-12 DIAGNOSIS — K219 Gastro-esophageal reflux disease without esophagitis: Secondary | ICD-10-CM | POA: Insufficient documentation

## 2018-09-12 DIAGNOSIS — Z96653 Presence of artificial knee joint, bilateral: Secondary | ICD-10-CM | POA: Insufficient documentation

## 2018-09-12 DIAGNOSIS — E559 Vitamin D deficiency, unspecified: Secondary | ICD-10-CM | POA: Insufficient documentation

## 2018-09-12 DIAGNOSIS — F329 Major depressive disorder, single episode, unspecified: Secondary | ICD-10-CM | POA: Diagnosis not present

## 2018-09-12 DIAGNOSIS — I10 Essential (primary) hypertension: Secondary | ICD-10-CM | POA: Diagnosis not present

## 2018-09-12 DIAGNOSIS — M797 Fibromyalgia: Secondary | ICD-10-CM | POA: Diagnosis not present

## 2018-09-12 DIAGNOSIS — K449 Diaphragmatic hernia without obstruction or gangrene: Secondary | ICD-10-CM | POA: Insufficient documentation

## 2018-09-12 DIAGNOSIS — M25861 Other specified joint disorders, right knee: Secondary | ICD-10-CM | POA: Diagnosis not present

## 2018-09-12 DIAGNOSIS — M1990 Unspecified osteoarthritis, unspecified site: Secondary | ICD-10-CM | POA: Diagnosis not present

## 2018-09-12 DIAGNOSIS — E1151 Type 2 diabetes mellitus with diabetic peripheral angiopathy without gangrene: Secondary | ICD-10-CM | POA: Diagnosis not present

## 2018-09-12 HISTORY — PX: KNEE ARTHROSCOPY: SHX127

## 2018-09-12 LAB — GLUCOSE, CAPILLARY: Glucose-Capillary: 93 mg/dL (ref 70–99)

## 2018-09-12 SURGERY — ARTHROSCOPY, KNEE
Anesthesia: General | Site: Knee | Laterality: Right

## 2018-09-12 MED ORDER — MIDAZOLAM HCL 2 MG/2ML IJ SOLN
INTRAMUSCULAR | Status: AC
  Filled 2018-09-12: qty 2

## 2018-09-12 MED ORDER — HYDROCODONE-ACETAMINOPHEN 5-325 MG PO TABS
1.0000 | ORAL_TABLET | ORAL | Status: DC | PRN
  Administered 2018-09-12: 16:00:00 2 via ORAL

## 2018-09-12 MED ORDER — LACTATED RINGERS IR SOLN
Status: DC | PRN
  Administered 2018-09-12 (×3): 3000 mL

## 2018-09-12 MED ORDER — PROPOFOL 10 MG/ML IV BOLUS
INTRAVENOUS | Status: DC | PRN
  Administered 2018-09-12: 150 mg via INTRAVENOUS

## 2018-09-12 MED ORDER — KETOROLAC TROMETHAMINE 30 MG/ML IJ SOLN
INTRAMUSCULAR | Status: AC
  Filled 2018-09-12: qty 1

## 2018-09-12 MED ORDER — HYDROCODONE-ACETAMINOPHEN 5-325 MG PO TABS
ORAL_TABLET | ORAL | Status: AC
  Administered 2018-09-12: 16:00:00 2 via ORAL
  Filled 2018-09-12: qty 2

## 2018-09-12 MED ORDER — DEXAMETHASONE SODIUM PHOSPHATE 10 MG/ML IJ SOLN
INTRAMUSCULAR | Status: AC
  Filled 2018-09-12: qty 1

## 2018-09-12 MED ORDER — ONDANSETRON HCL 4 MG/2ML IJ SOLN
INTRAMUSCULAR | Status: AC
  Filled 2018-09-12: qty 2

## 2018-09-12 MED ORDER — MIDAZOLAM HCL 5 MG/5ML IJ SOLN
INTRAMUSCULAR | Status: DC | PRN
  Administered 2018-09-12: 2 mg via INTRAVENOUS

## 2018-09-12 MED ORDER — SODIUM CHLORIDE 0.9 % IR SOLN
Status: DC | PRN
  Administered 2018-09-12 (×2): 3000 mL

## 2018-09-12 MED ORDER — FENTANYL CITRATE (PF) 100 MCG/2ML IJ SOLN
25.0000 ug | INTRAMUSCULAR | Status: DC | PRN
  Administered 2018-09-12: 16:00:00 50 ug via INTRAVENOUS

## 2018-09-12 MED ORDER — LACTATED RINGERS IV SOLN
INTRAVENOUS | Status: DC
  Administered 2018-09-12: 12:00:00 via INTRAVENOUS

## 2018-09-12 MED ORDER — ONDANSETRON HCL 4 MG/2ML IJ SOLN
INTRAMUSCULAR | Status: DC | PRN
  Administered 2018-09-12: 4 mg via INTRAVENOUS

## 2018-09-12 MED ORDER — CHLORHEXIDINE GLUCONATE 4 % EX LIQD: 60.0000 mL | Freq: Once | CUTANEOUS | Status: DC

## 2018-09-12 MED ORDER — PROMETHAZINE HCL 25 MG/ML IJ SOLN: 6.2500 mg | INTRAMUSCULAR | Status: DC | PRN

## 2018-09-12 MED ORDER — BUPIVACAINE-EPINEPHRINE 0.25% -1:200000 IJ SOLN
INTRAMUSCULAR | Status: DC | PRN
  Administered 2018-09-12: 30 mL

## 2018-09-12 MED ORDER — FENTANYL CITRATE (PF) 100 MCG/2ML IJ SOLN
INTRAMUSCULAR | Status: AC
  Filled 2018-09-12: qty 2

## 2018-09-12 MED ORDER — DEXAMETHASONE SODIUM PHOSPHATE 10 MG/ML IJ SOLN
INTRAMUSCULAR | Status: DC | PRN
  Administered 2018-09-12: 10 mg via INTRAVENOUS

## 2018-09-12 MED ORDER — PROPOFOL 10 MG/ML IV BOLUS
INTRAVENOUS | Status: AC
  Filled 2018-09-12: qty 20

## 2018-09-12 MED ORDER — KETOROLAC TROMETHAMINE 30 MG/ML IJ SOLN
INTRAMUSCULAR | Status: DC | PRN
  Administered 2018-09-12: 30 mg via INTRAVENOUS

## 2018-09-12 MED ORDER — POVIDONE-IODINE 10 % EX SWAB: 2.0000 "application " | Freq: Once | CUTANEOUS | Status: DC

## 2018-09-12 MED ORDER — FENTANYL CITRATE (PF) 100 MCG/2ML IJ SOLN
INTRAMUSCULAR | Status: DC | PRN
  Administered 2018-09-12 (×4): 25 ug via INTRAVENOUS

## 2018-09-12 MED ORDER — BUPIVACAINE-EPINEPHRINE (PF) 0.25% -1:200000 IJ SOLN
INTRAMUSCULAR | Status: AC
  Filled 2018-09-12: qty 30

## 2018-09-12 MED ORDER — LIDOCAINE 2% (20 MG/ML) 5 ML SYRINGE
INTRAMUSCULAR | Status: AC
  Filled 2018-09-12: qty 5

## 2018-09-12 MED ORDER — LIDOCAINE 2% (20 MG/ML) 5 ML SYRINGE
INTRAMUSCULAR | Status: DC | PRN
  Administered 2018-09-12: 100 mg via INTRAVENOUS

## 2018-09-12 MED ORDER — METHOCARBAMOL 500 MG PO TABS: 500.0000 mg | ORAL_TABLET | Freq: Four times a day (QID) | ORAL | Status: DC | PRN

## 2018-09-12 MED ORDER — WATER FOR IRRIGATION, STERILE IR SOLN
Status: DC | PRN
  Administered 2018-09-12: 500 mL

## 2018-09-12 MED ORDER — CEFAZOLIN SODIUM-DEXTROSE 2-4 GM/100ML-% IV SOLN
2.0000 g | INTRAVENOUS | Status: AC
  Administered 2018-09-12: 2 g via INTRAVENOUS
  Filled 2018-09-12: qty 100

## 2018-09-12 MED ORDER — ACETAMINOPHEN 10 MG/ML IV SOLN: 1000.0000 mg | Freq: Once | INTRAVENOUS | Status: DC | PRN

## 2018-09-12 SURGICAL SUPPLY — 34 items
BANDAGE ACE 6X5 VEL STRL LF (GAUZE/BANDAGES/DRESSINGS) ×2 IMPLANT
BLADE 4.2CUDA (BLADE) ×2 IMPLANT
COVER WAND RF STERILE (DRAPES) IMPLANT
DISSECTOR 4.0MM X 13CM (MISCELLANEOUS) ×1 IMPLANT
DRAPE U-SHAPE 47X51 STRL (DRAPES) ×2 IMPLANT
DRSG EMULSION OIL 3X3 NADH (GAUZE/BANDAGES/DRESSINGS) ×2 IMPLANT
DRSG PAD ABDOMINAL 8X10 ST (GAUZE/BANDAGES/DRESSINGS) ×2 IMPLANT
DURAPREP 26ML APPLICATOR (WOUND CARE) ×2 IMPLANT
GAUZE SPONGE 4X4 12PLY STRL (GAUZE/BANDAGES/DRESSINGS) ×2 IMPLANT
GLOVE BIO SURGEON STRL SZ8 (GLOVE) ×2 IMPLANT
GLOVE BIOGEL PI IND STRL 7.0 (GLOVE) ×1 IMPLANT
GLOVE BIOGEL PI IND STRL 8 (GLOVE) ×1 IMPLANT
GLOVE BIOGEL PI INDICATOR 7.0 (GLOVE) ×1
GLOVE BIOGEL PI INDICATOR 8 (GLOVE) ×1
GLOVE SURG SS PI 7.0 STRL IVOR (GLOVE) ×2 IMPLANT
GOWN STRL REUS W/TWL LRG LVL3 (GOWN DISPOSABLE) ×4 IMPLANT
KIT BASIN OR (CUSTOM PROCEDURE TRAY) ×2 IMPLANT
KIT TURNOVER KIT A (KITS) IMPLANT
MANIFOLD NEPTUNE II (INSTRUMENTS) ×2 IMPLANT
MARKER SKIN DUAL TIP RULER LAB (MISCELLANEOUS) ×2 IMPLANT
PACK ARTHROSCOPY WL (CUSTOM PROCEDURE TRAY) ×2 IMPLANT
PACK ICE MAXI GEL EZY WRAP (MISCELLANEOUS) ×6 IMPLANT
PAD CAST 4YDX4 CTTN HI CHSV (CAST SUPPLIES) IMPLANT
PADDING CAST COTTON 4X4 STRL (CAST SUPPLIES) ×2
PADDING CAST COTTON 6X4 STRL (CAST SUPPLIES) ×1 IMPLANT
PORT APPOLLO RF 90DEGREE MULTI (SURGICAL WAND) ×1 IMPLANT
PROBE BIPOLAR ATHRO 135MM 90D (MISCELLANEOUS) ×1 IMPLANT
PROTECTOR NERVE ULNAR (MISCELLANEOUS) ×2 IMPLANT
SUT ETHILON 4 0 PS 2 18 (SUTURE) ×2 IMPLANT
TOWEL OR 17X26 10 PK STRL BLUE (TOWEL DISPOSABLE) ×2 IMPLANT
TUBING ARTHRO INFLOW-ONLY STRL (TUBING) ×2 IMPLANT
TUBING ARTHROSCOPY IRRIG 16FT (MISCELLANEOUS) ×1 IMPLANT
TUBING CONNECTING 10 (TUBING) ×1 IMPLANT
WRAP KNEE MAXI GEL POST OP (GAUZE/BANDAGES/DRESSINGS) ×2 IMPLANT

## 2018-09-12 NOTE — Brief Op Note (Signed)
09/12/2018  3:20 PM  PATIENT:  [name]  73 y.o. female  PRE-OPERATIVE DIAGNOSIS:  right knee patellar clunk syndrome  POST-OPERATIVE DIAGNOSIS:  right knee patellar clunk syndrome  PROCEDURE:  Procedure(s) with comments: ARTHROSCOPY KNEE; SYNOVECTOMY (Right) - 33min  SURGEON:  Surgeon(s) and Role:    Gaynelle Arabian, MD - Primary  PHYSICIAN ASSISTANT:   ASSISTANTS: none   ANESTHESIA:   general  EBL:  25 mL   BLOOD ADMINISTERED:none  DRAINS: none   LOCAL MEDICATIONS USED:  MARCAINE     COUNTS:  YES  TOURNIQUET:  * Missing tourniquet times found for documented tourniquets in log: [id number] *  DICTATION: .Other Dictation: Dictation Number [id number]  PLAN OF CARE: Discharge to home after PACU  PATIENT DISPOSITION:  PACU - hemodynamically stable.

## 2018-09-12 NOTE — H&P (Signed)
CC- [name] is a 73 y.o. female who presents with right knee pain.  HPI- . Knee Pain: Patient presents with knee pain involving the  right knee. Onset of the symptoms was several months ago. Inciting event: Se had  a right Total Knee arthroplasty revision in 12/19 an did extrmely well initially but then developed painful popping int the knee consisent with patellar clunk syndrome. She presents now for arthroscopy right knee with synovectomy.d. Current symptoms include giving out, pain located anteriorly and popping sensation. Pain is aggravated by going up and down stairs and rising after sitting.  Patient has had prior knee problems.   Past Medical History:  Diagnosis Date  . Arthritis    [place]   . Colon polyps   . Depression   . Diabetes mellitus, type 2 ([place]) 06/2010  . Diabetic neuropathy ([place])   . Diverticulosis    pt unaware  . Esophageal stricture   . Fibromyalgia    [place]   . GERD (gastroesophageal reflux disease)   . Hemorrhoids   . Hiatal hernia   . Hyperlipidemia   . Hypertension   . Menopause   . Peripheral vascular insufficiency ([place])   . Retinopathy    Bilateral  . Vitamin D deficiency     Past Surgical History:  Procedure Laterality Date  . ABDOMINAL HYSTERECTOMY     partial and then total  . ANKLE SURGERY Right   . COLONOSCOPY    . ELBOW SURGERY     left  . REFRACTIVE SURGERY  2003  . TOTAL KNEE ARTHROPLASTY     bilateral  . TOTAL KNEE REVISION Right 03/09/2018   Procedure: RIGHT TOTAL KNEE REVISION;  Surgeon: Gaynelle Arabian, MD;  Location: [facility] ORS;  Service: Orthopedics;  Laterality: Right;  163mn with abductor block  . VESICOVAGINAL FISTULA CLOSURE W/ TAH  1981    Prior to Admission medications   Medication Sig Start Date End Date Taking? Authorizing Provider  ALPRAZolam (Duanne Moron 0.5 MG tablet Take 1-2 tabs daily as needed Patient taking differently: Take 0.5 mg by mouth 2 (two) times daily as needed for anxiety.  08/25/18  Yes MChipper Herb MD  ARIPiprazole (ABILIFY) 2 MG tablet TAKE 1 TABLET DAILY Patient taking differently: Take 1 mg by mouth daily.  08/23/18  Yes MChipper Herb MD  Cholecalciferol (VITAMIN D) 50 MCG (2000 UT) tablet Take 2,000 Units by mouth daily.    Yes [provider]  escitalopram (LEXAPRO) 20 MG tablet TAKE 1 TABLET DAILY Patient taking differently: Take 20 mg by mouth daily.  06/23/18  Yes MChipper Herb MD  esomeprazole (NEXIUM) 40 MG capsule TAKE (1) CAPSULE TWICE DAILY. Patient taking differently: Take 40 mg by mouth daily.  08/23/18  Yes MChipper Herb MD  ezetimibe (ZETIA) 10 MG tablet TAKE 1 TABLET DAILY Patient taking differently: Take 10 mg by mouth daily.  08/23/18  Yes MChipper Herb MD  gabapentin (NEURONTIN) 600 MG tablet TAKE  (1)  TABLET  FOUR TIMES DAILY. Patient taking differently: Take 600 mg by mouth 4 (four) times daily.  06/23/18  Yes MChipper Herb MD  HYDROcodone-acetaminophen (NORCO/VICODIN) 5-325 MG tablet Take 1 tablet by mouth every 6 (six) hours as needed for moderate pain.   Yes [provider]  lisinopril-hydrochlorothiazide (PRINZIDE,ZESTORETIC) 20-25 MG tablet TAKE 1 TABLET DAILY Patient taking differently: Take 1 tablet by mouth daily.  06/23/18  Yes MChipper Herb MD  metFORMIN (GLUCOPHAGE-XR) 500 MG 24 hr tablet  TAKE 2 TABLET ONCE DAILY WITH BREAKFAST Patient taking differently: Take 1,000 mg by mouth daily with breakfast.  07/18/18  Yes Chipper Herb, MD  OZEMPIC, 1 MG/DOSE, 2 MG/1.5ML SOPN Inject 1 mg as directed once a week. Patient taking differently: Inject 1 mg into the skin every 7 (seven) days.  07/18/18  Yes Chipper Herb, MD  Pitavastatin Calcium (LIVALO) 2 MG TABS TAKE (1) TABLET EVERY OTHER DAY. Patient taking differently: Take 2 mg by mouth 3 (three) times a week. TAKE (1) TABLET EVERY OTHER DAY. 06/23/18  Yes Chipper Herb, MD  PREMARIN vaginal cream Place 1 Applicatorful vaginally at bedtime as needed. Patient taking  differently: Place 1 Applicatorful vaginally at bedtime as needed (for dryness).  06/29/17  Yes Chipper Herb, MD  blood glucose meter kit and supplies KIT Dispense based on patient and insurance preference. Use up to two times daily as directed. (Dx: type 2 DM - E11.9) 04/30/16   Chipper Herb, MD  Blood Glucose Monitoring Suppl DEVI Blood glucose meter One Touch Brand. Use to check BS up to BID. DX 250.02 05/17/13   Chipper Herb, MD  fluconazole (DIFLUCAN) 150 MG tablet Take 1 tablet (150 mg total) by mouth daily. Patient not taking: Reported on 09/06/2018 06/13/18   Chipper Herb, MD  glucose blood (ONE TOUCH ULTRA TEST) test strip CHECK BLOOD SUGAR UP TO 2 TIMES A DAY 04/20/18   Chipper Herb, MD  glucose monitoring kit (FREESTYLE) monitoring kit Please dispense whichever glucometer patient's insurance will cover.  Use to check BG up to twice.  DX: 250.02 05/10/13   Chipper Herb, MD  metroNIDAZOLE (METROGEL) 0.75 % vaginal gel Place 1 Applicatorful vaginally at bedtime. For 5 nights Patient not taking: Reported on 09/06/2018 06/13/18   Chipper Herb, MD  [facility] DELICA LANCETS 21J MISC CHECK BLOOD SUGAR UP TO TWICE DAILY 04/20/18   Chipper Herb, MD   Right KNEE EXAM antalgic gait, no warmth or effusion, collateral ligaments intact, range 0-130 with moderate crepitus with knee extension; no instability  Physical Examination: General appearance - alert, well appearing, and in no distress Mental status - alert, oriented to person, place, and time Chest - clear to auscultation, no wheezes, rales or rhonchi, symmetric air entry Heart - normal rate, regular rhythm, normal S1, S2, no murmurs, rubs, clicks or gallops Abdomen - soft, nontender, nondistended, no masses or organomegaly Neurological - alert, oriented, normal speech, no focal findings or movement disorder noted    Asessment/Plan--- Right knee patellar clunk syndrome- - Plan right knee arthroscopy with synovectomy. Procedure risks  and potential comps discussed with patient who elects to proceed. Goals are decreased pain and increased function with a high likelihood of achieving both

## 2018-09-12 NOTE — Discharge Instructions (Signed)
° °Dr. Frank Aluisio °Total Joint Specialist °[facility] °[address] °[place], [place] [address] °[phone] ° ° °Arthroscopic Procedure, Knee °An arthroscopic procedure can find what is wrong with your knee. °PROCEDURE °Arthroscopy is a surgical technique that allows your orthopedic surgeon to diagnose and treat your knee injury with accuracy. They will look into your knee through a small instrument. This is almost like a small (pencil sized) telescope. Because arthroscopy affects your knee less than open knee surgery, you can anticipate a more rapid recovery. Taking an active role by following your caregiver's instructions will help with rapid and complete recovery. Use crutches, rest, elevation, ice, and knee exercises as instructed. The length of recovery depends on various factors including type of injury, age, physical condition, medical conditions, and your rehabilitation. °Your knee is the joint between the large bones (femur and tibia) in your leg. Cartilage covers these bone ends which are smooth and slippery and allow your knee to bend and move smoothly. Two menisci, thick, semi-lunar shaped pads of cartilage which form a rim inside the joint, help absorb shock and stabilize your knee. Ligaments bind the bones together and support your knee joint. Muscles move the joint, help support your knee, and take stress off the joint itself. Because of this all programs and physical therapy to rehabilitate an injured or repaired knee require rebuilding and strengthening your muscles. °AFTER THE PROCEDURE °· After the procedure, you will be moved to a recovery area until most of the effects of the medication have worn off. Your caregiver will discuss the test results with you.  °· Only take over-the-counter or prescription medicines for pain, discomfort, or fever as directed by your caregiver.  °SEEK MEDICAL CARE IF:  °· You have increased bleeding from your wounds.  °· You see redness,  swelling, or have increasing pain in your wounds.  °· You have pus coming from your wound.  °· You have an oral temperature above 102° F (38.9° C).  °· You notice a bad smell coming from the wound or dressing.  °· You have severe pain with any motion of your knee.  °SEEK IMMEDIATE MEDICAL CARE IF:  °· You develop a rash.  °· You have difficulty breathing.  °· You have any allergic problems.  °FURTHER INSTRUCTIONS:  °· ICE to the affected knee every three hours for 30 minutes at a time and then as needed for pain and swelling.  Continue to use ice on the knee for pain and swelling from surgery. You may notice swelling that will progress down to the foot and ankle.  This is normal after surgery.  Elevate the leg when you are not up walking on it.   ° °DIET °You may resume your previous home diet once your are discharged from the hospital. ° °DRESSING / WOUND CARE / SHOWERING °You may change your dressing 3-5 days after surgery.  Then change the dressing every day with sterile gauze.  Please use good hand washing techniques before changing the dressing.  Do not use any lotions or creams on the incision until instructed by your surgeon. °You may start showering two days after being discharged home but do not submerge the incisions under water.  °Change dressing 48 hours after the procedure and then cover the small incisions with band aids until your follow up visit. °Change the surgical dressings daily and reapply a dry dressing each time.  ° °ACTIVITY °Walk with your walker as instructed. °Use walker as long as   suggested by your caregivers. °Avoid periods of inactivity such as sitting longer than an hour when not asleep. This helps prevent blood clots.  °You may resume a sexual relationship in one month or when given the OK by your doctor.  °You may return to work once you are cleared by your doctor.  °Do not drive a car for 6 weeks or until released by you surgeon.  °Do not drive while taking narcotics. ° °WEIGHT  BEARING °Weight bearing as tolerated with assist device (walker, cane, etc) as directed, use it as long as suggested by your surgeon or therapist, typically at least 4-6 weeks. ° °POSTOPERATIVE CONSTIPATION PROTOCOL °Constipation - defined medically as fewer than three stools per week and severe constipation as less than one stool per week. ° °One of the most common issues patients have following surgery is constipation.  Even if you have a regular bowel pattern at home, your normal regimen is likely to be disrupted due to multiple reasons following surgery.  Combination of anesthesia, postoperative narcotics, change in appetite and fluid intake all can affect your bowels.  In order to avoid complications following surgery, here are some recommendations in order to help you during your recovery period. ° °Colace (docusate) - Pick up an over-the-counter form of Colace or another stool softener and take twice a day as long as you are requiring postoperative pain medications.  Take with a full glass of water daily.  If you experience loose stools or diarrhea, hold the colace until you stool forms back up.  If your symptoms do not get better within 1 week or if they get worse, check with your doctor. ° °Dulcolax (bisacodyl) - Pick up over-the-counter and take as directed by the product packaging as needed to assist with the movement of your bowels.  Take with a full glass of water.  Use this product as needed if not relieved by Colace only.  ° °MiraLax (polyethylene glycol) - Pick up over-the-counter to have on hand.  MiraLax is a solution that will increase the amount of water in your bowels to assist with bowel movements.  Take as directed and can mix with a glass of water, juice, soda, coffee, or tea.  Take if you go more than two days without a movement. °Do not use MiraLax more than once per day. Call your doctor if you are still constipated or irregular after using this medication for 7 days in a row. ° °If you  continue to have problems with postoperative constipation, please contact the office for further assistance and recommendations.  If you experience "the worst abdominal pain ever" or develop nausea or vomiting, please contact the office immediatly for further recommendations for treatment. ° °ITCHING ° If you experience itching with your medications, try taking only a single pain pill, or even half a pain pill at a time.  You can also use Benadryl over the counter for itching or also to help with sleep.  ° °TED HOSE STOCKINGS °Wear the elastic stockings on both legs for three weeks following surgery during the day but you may remove then at night for sleeping. ° °MEDICATIONS °See your medication summary on the “After Visit Summary” that the nursing staff will review with you prior to discharge.  You may have some home medications which will be placed on hold until you complete the course of blood thinner medication.  It is important for you to complete the blood thinner medication as prescribed by your surgeon.  Continue your   approved medications as instructed at time of discharge. °Do not drive while taking narcotics.  ° °PRECAUTIONS °If you experience chest pain or shortness of breath - call 911 immediately for transfer to the hospital emergency department.  °If you develop a fever greater that 101 F, purulent drainage from wound, increased redness or drainage from wound, foul odor from the wound/dressing, or calf pain - CONTACT YOUR SURGEON.   °                                                °FOLLOW-UP APPOINTMENTS °Make sure you keep all of your appointments after your operation with your surgeon and caregivers. You should call the office at [phone]  and make an appointment for approximately one week after the date of your surgery or on the date instructed by your surgeon outlined in the "After Visit Summary". ° °RANGE OF MOTION AND STRENGTHENING EXERCISES  °Rehabilitation of the knee is important  following a knee injury or an operation. After just a few days of immobilization, the muscles of the thigh which control the knee become weakened and shrink (atrophy). Knee exercises are designed to build up the tone and strength of the thigh muscles and to improve knee motion. Often times heat used for twenty to thirty minutes before working out will loosen up your tissues and help with improving the range of motion but do not use heat for the first two weeks following surgery. These exercises can be done on a training (exercise) mat, on the floor, on a table or on a bed. Use what ever works the best and is most comfortable for you Knee exercises include: ° °QUAD STRENGTHENING EXERCISES °Strengthening Quadriceps Sets ° °Tighten muscles on top of thigh by pushing knees down into floor or table. °Hold for 20 seconds. Repeat 10 times. °Do 2 sessions per day. ° ° ° ° °Strengthening Terminal Knee Extension ° °With knee bent over bolster, straighten knee by tightening muscle on top of thigh. Be sure to keep bottom of knee on bolster. °Hold for 20 seconds. Repeat 10 times. °Do 2 sessions per day. ° ° °Straight Leg with Bent Knee ° °Lie on back with opposite leg bent. Keep involved knee slightly bent at knee and raise leg 4-6". Hold for 10 seconds. °Repeat 20 times per set. °Do 2 sets per session. °Do 2 sessions per day. ° ° °

## 2018-09-12 NOTE — Anesthesia Preprocedure Evaluation (Addendum)
Anesthesia Evaluation  Patient identified by MRN, date of birth, ID band Patient awake    Reviewed: Allergy & Precautions, NPO status , Patient's Chart, lab work & pertinent test results  History of Anesthesia Complications Negative for: history of anesthetic complications  Airway Mallampati: II  TM Distance: >3 FB Neck ROM: Full    Dental no notable dental hx.    Pulmonary former smoker,    Pulmonary exam normal        Cardiovascular hypertension, Normal cardiovascular exam     Neuro/Psych negative neurological ROS     GI/Hepatic Neg liver ROS, hiatal hernia, GERD  Controlled and Medicated,  Endo/Other  diabetes, Type 2  Renal/GU negative Renal ROS     Musculoskeletal  (+) Arthritis , Fibromyalgia -  Abdominal   Peds  Hematology negative hematology ROS (+)   Anesthesia Other Findings Day of surgery medications reviewed with the patient.  Reproductive/Obstetrics                            Anesthesia Physical Anesthesia Plan  ASA: III  Anesthesia Plan: General   Post-op Pain Management:    Induction: Intravenous  PONV Risk Score and Plan: 3 and Treatment may vary due to age or medical condition, Ondansetron and Dexamethasone  Airway Management Planned: LMA  Additional Equipment:   Intra-op Plan:   Post-operative Plan: Extubation in OR  Informed Consent: I have reviewed the patients History and Physical, chart, labs and discussed the procedure including the risks, benefits and alternatives for the proposed anesthesia with the patient or authorized representative who has indicated his/her understanding and acceptance.     Dental advisory given  Plan Discussed with: CRNA  Anesthesia Plan Comments:         Anesthesia Quick Evaluation

## 2018-09-12 NOTE — Op Note (Signed)
NAME: [name], [name] MEDICAL RECORD ER:[id number] ACCOUNT [id number] DATE OF BIRTH:1946-01-20 FACILITY: [facility] LOCATION: [facility] PHYSICIAN:Wheeler [employer] Zella Ball, MD  OPERATIVE REPORT  DATE OF PROCEDURE:  09/12/2018  PREOPERATIVE DIAGNOSIS:  Right knee patellar clunk syndrome.  POSTOPERATIVE DIAGNOSIS:  Right knee patellar clunk syndrome.  PROCEDURE:  Right knee arthroscopy with synovectomy.  SURGEON:  Gaynelle Arabian, MD  ASSISTANT:  No assistant  ANESTHESIA:  General.  ESTIMATED BLOOD LOSS:  5 mL.  DRAINS:  None.  COMPLICATIONS:  None.  CONDITION:  Stable to recovery.  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  She did extremely well functionally and had excellent pain relief, but in the past 2 months, she has had  significant pain and popping in the knee consistent with patellar clunk syndrome.  She presents now for arthroscopy and synovectomy.  PROCEDURE IN DETAIL:  After successful administration of general anesthetic, the patient's right lower extremity was isolated with a sterile drape and then prepped and draped in the usual sterile fashion.  Standard superomedial, inferolateral incisions  were made.  Inflow cannula passed, superomedial camera passed inferolateral.  Arthroscopic visualization proceeded.  There was a large mound of hypertrophic synovial tissue at the junction of the quadriceps tendon and superior pole of the patella.  This  was obliterating the suprapatellar pouch.  A superolateral portal was created with an 11 blade, and then a shaver and Arthrocare were used to debride this tissue back to normal-appearing tissue and to cauterize it with the Arthrocare.  This effectively  recreated the suprapatellar pouch.  There was also some tissue laterally in the lateral gutter, which was removed as well as a small amount medially which was removed.  Joints again inspected.  No  other abnormal tissue was identified.  The arthroscopic  equipment was removed from the lateral portals, which were closed with interrupted 4-0 nylon.  Twenty mL of 0.25% Marcaine with epinephrine was injected through the inflow cannula, and then after removed then that portal closed with nylon.  Incisions  were cleaned and dried and a bulky sterile dressing applied.  She was then awakened and transported to recovery in stable condition.  LN/NUANCE  D:09/12/2018 T:09/12/2018 JOB:[id number]

## 2018-09-12 NOTE — Anesthesia Postprocedure Evaluation (Signed)
Anesthesia Post Note  Patient: [name]  Procedure(s) Performed: ARTHROSCOPY KNEE; SYNOVECTOMY (Right Knee)     Patient location during evaluation: PACU Anesthesia Type: General Level of consciousness: awake and alert Pain management: pain level controlled Vital Signs Assessment: post-procedure vital signs reviewed and stable Respiratory status: spontaneous breathing, nonlabored ventilation and respiratory function stable Cardiovascular status: blood pressure returned to baseline and stable Postop Assessment: no apparent nausea or vomiting Anesthetic complications: no    Last Vitals:  Vitals:   09/12/18 1150 09/12/18 1531  BP: 133/79 (!) 145/73  Pulse: 74 83  Resp: 18 11  Temp: 36.6 C 36.5 C  SpO2: 99% 100%    Last Pain:  Vitals:   09/12/18 1531  TempSrc:   PainSc: [place]

## 2018-09-12 NOTE — Transfer of Care (Signed)
Immediate Anesthesia Transfer of Care Note  Patient: [name]  Procedure(s) Performed: ARTHROSCOPY KNEE; SYNOVECTOMY (Right Knee)  Patient Location: PACU  Anesthesia Type:General  Level of Consciousness: awake  Airway & Oxygen Therapy: Patient Spontanous Breathing and Patient connected to face mask oxygen  Post-op Assessment: Report given to RN and Post -op Vital signs reviewed and stable  Post vital signs: Reviewed and stable  Last Vitals:  Vitals Value Taken Time  BP 145/73 09/12/2018  3:31 PM  Temp    Pulse 81 09/12/2018  3:32 PM  Resp 11 09/12/2018  3:32 PM  SpO2 100 % 09/12/2018  3:32 PM  Vitals shown include unvalidated device data.  Last Pain:  Vitals:   09/12/18 1204  TempSrc:   PainSc: 5       Patients Stated Pain Goal: 4 (57/97/28 2060)  Complications: No apparent anesthesia complications

## 2018-09-12 NOTE — Anesthesia Procedure Notes (Signed)
Procedure Name: LMA Insertion Date/Time: 09/12/2018 2:16 PM Performed by: Sharlette Dense, CRNA Patient Re-evaluated:Patient Re-evaluated prior to induction Oxygen Delivery Method: Circle system utilized Preoxygenation: Pre-oxygenation with 100% oxygen LMA: LMA inserted LMA Size: 4.0 Number of attempts: 1 Placement Confirmation: positive ETCO2 and breath sounds checked- equal and bilateral Tube secured with: Tape Dental Injury: Teeth and Oropharynx as per pre-operative assessment

## 2018-09-13 ENCOUNTER — Encounter (HOSPITAL_COMMUNITY): Payer: Self-pay | Admitting: Orthopedic Surgery

## 2018-09-23 ENCOUNTER — Telehealth: Payer: Self-pay | Admitting: Family Medicine

## 2018-09-23 NOTE — Telephone Encounter (Signed)
Pt aware that if they do appt on July 1st it would be over the telephone. Is he going to be doing visits on July 1st? Or do they need to be set up with someone else? They know you will not be back till Monday.

## 2018-09-26 NOTE — Telephone Encounter (Signed)
Pt called about apt

## 2018-09-27 ENCOUNTER — Other Ambulatory Visit: Payer: Self-pay | Admitting: Family Medicine

## 2018-10-05 ENCOUNTER — Other Ambulatory Visit: Payer: Self-pay | Admitting: Family Medicine

## 2018-10-05 ENCOUNTER — Ambulatory Visit (INDEPENDENT_AMBULATORY_CARE_PROVIDER_SITE_OTHER): Payer: Medicare Other | Admitting: Family Medicine

## 2018-10-05 ENCOUNTER — Other Ambulatory Visit: Payer: Self-pay | Admitting: *Deleted

## 2018-10-05 ENCOUNTER — Other Ambulatory Visit: Payer: Self-pay

## 2018-10-05 ENCOUNTER — Encounter: Payer: Self-pay | Admitting: Family Medicine

## 2018-10-05 ENCOUNTER — Telehealth: Payer: Self-pay | Admitting: Family Medicine

## 2018-10-05 DIAGNOSIS — M51379 Other intervertebral disc degeneration, lumbosacral region without mention of lumbar back pain or lower extremity pain: Secondary | ICD-10-CM

## 2018-10-05 DIAGNOSIS — M797 Fibromyalgia: Secondary | ICD-10-CM

## 2018-10-05 DIAGNOSIS — E1169 Type 2 diabetes mellitus with other specified complication: Secondary | ICD-10-CM

## 2018-10-05 DIAGNOSIS — K219 Gastro-esophageal reflux disease without esophagitis: Secondary | ICD-10-CM | POA: Diagnosis not present

## 2018-10-05 DIAGNOSIS — I10 Essential (primary) hypertension: Secondary | ICD-10-CM | POA: Diagnosis not present

## 2018-10-05 DIAGNOSIS — E1149 Type 2 diabetes mellitus with other diabetic neurological complication: Secondary | ICD-10-CM | POA: Diagnosis not present

## 2018-10-05 DIAGNOSIS — E559 Vitamin D deficiency, unspecified: Secondary | ICD-10-CM

## 2018-10-05 DIAGNOSIS — E11610 Type 2 diabetes mellitus with diabetic neuropathic arthropathy: Secondary | ICD-10-CM

## 2018-10-05 DIAGNOSIS — R911 Solitary pulmonary nodule: Secondary | ICD-10-CM

## 2018-10-05 DIAGNOSIS — M5137 Other intervertebral disc degeneration, lumbosacral region: Secondary | ICD-10-CM

## 2018-10-05 DIAGNOSIS — E889 Metabolic disorder, unspecified: Secondary | ICD-10-CM

## 2018-10-05 DIAGNOSIS — E785 Hyperlipidemia, unspecified: Secondary | ICD-10-CM

## 2018-10-05 DIAGNOSIS — G63 Polyneuropathy in diseases classified elsewhere: Secondary | ICD-10-CM

## 2018-10-05 DIAGNOSIS — I700 Atherosclerosis of aorta: Secondary | ICD-10-CM

## 2018-10-05 DIAGNOSIS — F320 Major depressive disorder, single episode, mild: Secondary | ICD-10-CM

## 2018-10-05 NOTE — Patient Instructions (Addendum)
Continue to follow-up with orthopedist as planned Continue to practice good hand and respiratory hygiene in the midst of the COVID pandemic Check blood sugars regularly and keep eye exams regularly if safe Drink plenty of water and fluids Stay active physically with walking and try to achieve weight loss through diet and exercise Patient will reschedule her visit for colonoscopy with Dr. Henrene Pastor Patient will reschedule her visit with neurologist because of hand tremor We will arrange for her to see the cardiologist because of increased risk factors for heart disease to have a good cardiac evaluation sometime this fall.

## 2018-10-05 NOTE — Progress Notes (Signed)
Virtual Visit Via telephone Note I connected with@ on 10/05/18 by telephone and verified that I am speaking with the correct person or authorized healthcare agent using two identifiers. [name] is currently located at home and there are no unauthorized people in close proximity. I completed this visit while in a private location in my home .  This visit type was conducted due to national recommendations for restrictions regarding the COVID-19 Pandemic (e.g. social distancing).  This format is felt to be most appropriate for this patient at this time.  All issues noted in this document were discussed and addressed.  No physical exam was performed.    I discussed the limitations, risks, security and privacy concerns of performing an evaluation and management service by telephone and the availability of in person appointments. I also discussed with the patient that there may be a patient responsible charge related to this service. The patient expressed understanding and agreed to proceed.   Date:  10/05/2018    ID:  [name]      06-14-1945        [id number]   Patient Care Team Patient Care Team: Dettinger, Fransisca Kaufmann, MD as PCP - General (Family Medicine) Buford Dresser, MD as PCP - Cardiology (Cardiology) Irene Shipper, MD as Consulting Physician (Gastroenterology) Sheralyn Boatman, MD as Consulting Physician (Psychiatry) Suella Broad, MD as Consulting Physician (Physical Medicine and Rehabilitation) Marica Otter, [place] as Consulting Physician (Optometry)  Reason for Visit: Primary Care Follow-up     History of Present Illness & Review of Systems:     [name] is a 73 y.o. year old female primary care patient that presents today for a telehealth visit.  [name] is alert and doing well.  She denies any chest pain pressure tightness shortness of breath.  She denies any trouble with swallowing heartburn indigestion nausea vomiting diarrhea blood in the stool or black  tarry bowel movements.  She has had an arthroplasty done recently and is still having problems recovering from this with her knee.  She has had a previous total knee.  She is also having ongoing problems with her back and gets her pain medicine from [facility] and Dr. Herma Mering.  She will continue to follow-up with him.  She is passing her water well. She is up-to-date on her eye exams.  Review of systems as stated, otherwise negative.  The patient does not have symptoms concerning for COVID-19 infection (fever, chills, cough, or new shortness of breath).      Current Medications (Verified) Allergies as of 10/05/2018      Reactions   Codeine Nausea And Vomiting   Erythromycin Nausea And Vomiting   Fenofibrate Other (See Comments)   aching & edema    Lyrica [pregabalin] Other (See Comments)   Edema in feet   Penicillins Other (See Comments)   Blisters in mouth Did it involve swelling of the face/tongue/throat, SOB, or low BP? No Did it involve sudden or severe rash/hives, skin peeling, or any reaction on the inside of your mouth or nose? No Did you need to seek medical attention at a hospital or doctor's office? No When did it last happen?Childhood allergy If all above answers are "NO", may proceed with cephalosporin use.   Statins Other (See Comments)   Joints ache   Sulfa Antibiotics Nausea And Vomiting      Medication List       Accurate as of October 05, 2018  8:07  AM. If you have any questions, ask your nurse or doctor.        ALPRAZolam 0.5 MG tablet Commonly known as: XANAX Take 1-2 tabs daily as needed What changed:   how much to take  how to take this  when to take this  reasons to take this  additional instructions   ARIPiprazole 2 MG tablet Commonly known as: ABILIFY TAKE 1 TABLET DAILY What changed: how much to take   blood glucose meter kit and supplies Kit Dispense based on patient and insurance preference. Use up to two times daily as directed.  (Dx: type 2 DM - E11.9)   escitalopram 20 MG tablet Commonly known as: LEXAPRO TAKE 1 TABLET DAILY   esomeprazole 40 MG capsule Commonly known as: NEXIUM TAKE (1) CAPSULE TWICE DAILY. What changed: See the new instructions.   ezetimibe 10 MG tablet Commonly known as: ZETIA TAKE 1 TABLET DAILY   gabapentin 600 MG tablet Commonly known as: NEURONTIN TAKE  (1)  TABLET  FOUR TIMES DAILY. What changed: See the new instructions.   glucose blood test strip Commonly known as: ONE TOUCH ULTRA TEST CHECK BLOOD SUGAR UP TO 2 TIMES A DAY   glucose monitoring kit monitoring kit Please dispense whichever glucometer patient's insurance will cover.  Use to check BG up to twice.  DX: 250.02   Blood Glucose Monitoring Suppl Devi Blood glucose meter One Touch Brand. Use to check BS up to BID. DX 250.02   HYDROcodone-acetaminophen 5-325 MG tablet Commonly known as: NORCO/VICODIN Take 1 tablet by mouth every 6 (six) hours as needed for moderate pain.   lisinopril-hydrochlorothiazide 20-25 MG tablet Commonly known as: ZESTORETIC TAKE 1 TABLET DAILY   metFORMIN 500 MG 24 hr tablet Commonly known as: GLUCOPHAGE-XR TAKE 2 TABLET ONCE DAILY WITH BREAKFAST What changed:   how much to take  how to take this  when to take this  additional instructions   OneTouch Delica Lancets 86V Misc CHECK BLOOD SUGAR UP TO TWICE DAILY   Ozempic (1 MG/DOSE) 2 MG/1.5ML Sopn Generic drug: Semaglutide (1 MG/DOSE) Inject 1 mg as directed once a week.   Pitavastatin Calcium 2 MG Tabs Commonly known as: Livalo TAKE (1) TABLET EVERY OTHER DAY. What changed:   how much to take  how to take this  when to take this   Premarin vaginal cream Generic drug: conjugated estrogens Place 1 Applicatorful vaginally at bedtime as needed. What changed: See the new instructions.   Vitamin D 50 MCG (2000 UT) tablet Take 2,000 Units by mouth daily.           Allergies (Verified)    Codeine,  Erythromycin, Fenofibrate, Lyrica [pregabalin], Penicillins, Statins, and Sulfa antibiotics  Past Medical History Past Medical History:  Diagnosis Date  . Arthritis    [place]   . Colon polyps   . Depression   . Diabetes mellitus, type 2 ([place]) 06/2010  . Diabetic neuropathy ([place])   . Diverticulosis    pt unaware  . Esophageal stricture   . Fibromyalgia    [place]   . GERD (gastroesophageal reflux disease)   . Hemorrhoids   . Hiatal hernia   . Hyperlipidemia   . Hypertension   . Menopause   . Peripheral vascular insufficiency ([place])   . Retinopathy    Bilateral  . Vitamin D deficiency      Past Surgical History:  Procedure Laterality Date  . ABDOMINAL HYSTERECTOMY     partial and then total  .  ANKLE SURGERY Right   . COLONOSCOPY    . ELBOW SURGERY     left  . KNEE ARTHROSCOPY Right 09/12/2018   Procedure: ARTHROSCOPY KNEE; SYNOVECTOMY;  Surgeon: Gaynelle Arabian, MD;  Location: [facility];  Service: Orthopedics;  Laterality: Right;  17mn  . REFRACTIVE SURGERY  2003  . TOTAL KNEE ARTHROPLASTY     bilateral  . TOTAL KNEE REVISION Right 03/09/2018   Procedure: RIGHT TOTAL KNEE REVISION;  Surgeon: AGaynelle Arabian MD;  Location: [facility];  Service: Orthopedics;  Laterality: Right;  1275m with abductor block  . VESICOVAGINAL FISTULA CLOSURE W/ TAH  1981    Social History   Socioeconomic History  . Marital status: Married    Spouse name: DaKasandra Knudsen. Number of children: 2  . Years of education: Not on file  . Highest education level: Not on file  Occupational History  . Occupation: Retired  [employer] laboratory technician. Financial resource strain: Not on file  . Food insecurity    Worry: Not on file    Inability: Not on file  . Transportation needs    Medical: Not on file    Non-medical: Not on file  Tobacco Use  . Smoking status: Former Smoker    Packs/day: 1.00    Years: 10.00    Pack years: 10.00    Types: Cigarettes    Start date: 11/07/1975    Quit date: 06/24/2010    Years  since quitting: 8.2  . Smokeless tobacco: Never Used  Substance and Sexual Activity  . Alcohol use: Yes    Comment: social  . Drug use: No  . Sexual activity: Yes    Birth control/protection: Surgical  Lifestyle  . Physical activity    Days per week: Not on file    Minutes per session: Not on file  . Stress: Not on file  Relationships  . Social coHerbalistn phone: Not on file    Gets together: Not on file    Attends religious service: Not on file    Active member of club or organization: Not on file    Attends meetings of clubs or organizations: Not on file    Relationship status: Not on file  Other Topics Concern  . Not on file  Social History Narrative  . Not on file     Family History  Problem Relation Age of Onset  . Diabetes Mother   . Heart failure Mother   . Hypertension Mother   . Cirrhosis Father   . Cirrhosis Brother       Labs/Other Tests and Data Reviewed:    Wt Readings from Last 3 Encounters:  09/12/18 198 lb 2 oz (89.9 kg)  09/08/18 198 lb 2 oz (89.9 kg)  06/13/18 200 lb (90.7 kg)   Temp Readings from Last 3 Encounters:  09/12/18 97.7 F (36.5 C)  09/08/18 98 F (36.7 C) (Oral)  06/13/18 (!) 97.2 F (36.2 C) (Oral)   BP Readings from Last 3 Encounters:  09/12/18 136/81  09/08/18 131/66  06/13/18 115/69   Pulse Readings from Last 3 Encounters:  09/12/18 84  09/08/18 78  06/13/18 89     Lab Results  Component Value Date   HGBA1C 6.9 (H) 09/08/2018   HGBA1C 7.1 (H) 05/18/2018   HGBA1C 6.9 (H) 03/01/2018   Lab Results  Component Value Date   MICROALBUR neg 08/30/2014   LDLCALC 68 05/18/2018   CREATININE 0.66 09/08/2018  Chemistry      Component Value Date/Time   NA 134 (L) 09/08/2018 0827   NA 135 05/18/2018 0838   K 4.5 09/08/2018 0827   CL 97 (L) 09/08/2018 0827   CO2 27 09/08/2018 0827   BUN 9 09/08/2018 0827   BUN 19 05/18/2018 0838   CREATININE 0.66 09/08/2018 0827   CREATININE 0.68 10/21/2012  0827      Component Value Date/Time   CALCIUM 9.5 09/08/2018 0827   ALKPHOS 87 05/18/2018 0838   AST 14 05/18/2018 0838   ALT 20 05/18/2018 0838   BILITOT 0.3 05/18/2018 0838         OBSERVATIONS/ OBJECTIVE:     The patient does not check her blood pressures regularly but says typically the blood pressures run between 120 and 160 fasting.  She is also not checking her pulse rates.  Her blood pressures typically run between 120-130/70-80 range.  Her feet are good with no signs of any infection redness or drainage.  She gets her eye exams done yearly with [facility] eye physicians in [place].  Because of increased risk factors for heart disease we will schedule her to see Dr. Percival Spanish this fall.  The patient herself will schedule her visit with Dr. Henrene Pastor as she is almost a year behind getting her routine colonoscopy.  She also canceled a recent appointment with the neurologist and will reschedule this for a hand tremor.  Physical exam deferred due to nature of telephonic visit.  ASSESSMENT & PLAN    Time:   Today, I have spent 23 minutes with the patient via telephone discussing the above including Covid precautions.     Visit Diagnoses: 1. Essential hypertension, benign -We will continue current treatment as Home blood pressures have been running in the 1 20-1 30 range over the 70-80 range.  2. Type 2 diabetes mellitus with other neurologic complication, without long-term current use of insulin (HCC) -Most recent hemoglobin A1c outside of this office was 6.9% she will continue with current treatment  3. Hyperlipidemia associated with type 2 diabetes mellitus ([place]) -Continue with statin therapy and aggressive therapeutic lifestyle changes geared toward weight loss pending results of lab work  4. Gastroesophageal reflux disease, esophagitis presence not specified -No complaints with reflux or change in bowel habits.  She will continue with Nexium.  5. Vitamin D  deficiency -Continue with vitamin D replacement pending results of lab work  6. Fibromyalgia syndrome -Continue with pain management from Dr. Herma Mering  7. DDD (degenerative disc disease), lumbosacral -Continue to follow-up with Dr. Nelva Bush and Dr. Reynaldo Minium  8. 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 Aortic atherosclerosis ([place]) -Continue aggressive therapeutic lifestyle changes and management of cholesterol with diet and exercise and continue with current treatment of Zetia pending results of lab work  10. Solitary pulmonary nodule -According to last CT scan on February 28 of this year, there was no evidence of calcified or noncalcified pulmonary nodules.  There was no acute cardiopulmonary disease.  Does have aortic atherosclerosis.  There was no recommendation for repeating CT scan.  11. Current mild episode of major depressive disorder, unspecified whether recurrent ([place]) -This appears to be stable she will continue with Lexapro Abilify and Xanax.  12. 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[place]) -Continue good blood sugar control with current treatment and keep feet checked regularly  Patient Instructions  Continue to follow-up with orthopedist as planned Continue to practice good hand  and respiratory hygiene in the midst of the COVID pandemic Check blood sugars regularly and keep eye exams regularly if safe Drink plenty of water and fluids Stay active physically with walking and try to achieve weight loss through diet and exercise Patient will reschedule her visit for colonoscopy with Dr. Henrene Pastor Patient will reschedule her visit with neurologist because of hand tremor We will arrange for her to see the cardiologist because of increased risk factors for heart disease to have a good cardiac evaluation sometime this fall.     The above assessment and management plan was discussed with the patient. The patient  verbalized understanding of and has agreed to the management plan. Patient is aware to call the clinic if symptoms persist or worsen. Patient is aware when to return to the clinic for a follow-up visit. Patient educated on when it is appropriate to go to the emergency department.    Chipper Herb, MD [address] [place], [place], [place] [address] Ph [phone]   Arrie Senate MD

## 2018-10-06 ENCOUNTER — Other Ambulatory Visit: Payer: Medicare Other

## 2018-10-06 ENCOUNTER — Telehealth: Payer: Self-pay | Admitting: Family Medicine

## 2018-10-06 ENCOUNTER — Other Ambulatory Visit: Payer: Self-pay

## 2018-10-06 ENCOUNTER — Encounter: Payer: Self-pay | Admitting: Internal Medicine

## 2018-10-06 DIAGNOSIS — E785 Hyperlipidemia, unspecified: Secondary | ICD-10-CM | POA: Diagnosis not present

## 2018-10-06 DIAGNOSIS — E559 Vitamin D deficiency, unspecified: Secondary | ICD-10-CM

## 2018-10-06 DIAGNOSIS — E1169 Type 2 diabetes mellitus with other specified complication: Secondary | ICD-10-CM | POA: Diagnosis not present

## 2018-10-06 DIAGNOSIS — E1149 Type 2 diabetes mellitus with other diabetic neurological complication: Secondary | ICD-10-CM

## 2018-10-06 DIAGNOSIS — I10 Essential (primary) hypertension: Secondary | ICD-10-CM

## 2018-10-06 LAB — URINALYSIS, COMPLETE
Bilirubin, UA: NEGATIVE
Glucose, UA: NEGATIVE
Ketones, UA: NEGATIVE
Nitrite, UA: NEGATIVE
Protein,UA: NEGATIVE
RBC, UA: NEGATIVE
Specific Gravity, UA: 1.02 (ref 1.005–1.030)
Urobilinogen, Ur: 0.2 mg/dL (ref 0.2–1.0)
pH, UA: 7 (ref 5.0–7.5)

## 2018-10-06 LAB — MICROSCOPIC EXAMINATION
Epithelial Cells (non renal): >10 /hpf — AB (ref 0–10)
RBC, Urine: NONE SEEN /hpf (ref 0–2)
Renal Epithel, UA: NONE SEEN /hpf

## 2018-10-07 LAB — BMP8+EGFR
BUN/Creatinine Ratio: 13 (ref 12–28)
BUN: 10 mg/dL (ref 8–27)
CO2: 26 mmol/L (ref 20–29)
Calcium: 9.9 mg/dL (ref 8.7–10.3)
Chloride: 94 mmol/L — ABNORMAL LOW (ref 96–106)
Creatinine, Ser: 0.75 mg/dL (ref 0.57–1.00)
GFR calc Af Amer: 92 mL/min/{1.73_m2} (ref >59)
GFR calc non Af Amer: 80 mL/min/{1.73_m2} (ref >59)
Glucose: 121 mg/dL — ABNORMAL HIGH (ref 65–99)
Potassium: 5 mmol/L (ref 3.5–5.2)
Sodium: 134 mmol/L (ref 134–144)

## 2018-10-07 LAB — CBC WITH DIFFERENTIAL/PLATELET
Basophils Absolute: 0.1 10*3/uL (ref 0.0–0.2)
Basos: 1 %
EOS (ABSOLUTE): 0.2 10*3/uL (ref 0.0–0.4)
Eos: 3 %
Hematocrit: 38.4 % (ref 34.0–46.6)
Hemoglobin: 12.3 g/dL (ref 11.1–15.9)
Immature Grans (Abs): 0 10*3/uL (ref 0.0–0.1)
Immature Granulocytes: 0 %
Lymphocytes Absolute: 2.2 10*3/uL (ref 0.7–3.1)
Lymphs: 28 %
MCH: 27 pg (ref 26.6–33.0)
MCHC: 32 g/dL (ref 31.5–35.7)
MCV: 84 fL (ref 79–97)
Monocytes Absolute: 0.9 10*3/uL (ref 0.1–0.9)
Monocytes: 12 %
Neutrophils Absolute: 4.5 10*3/uL (ref 1.4–7.0)
Neutrophils: 56 %
Platelets: 305 10*3/uL (ref 150–450)
RBC: 4.56 x10E6/uL (ref 3.77–5.28)
RDW: 14.8 % (ref 11.7–15.4)
WBC: 7.9 10*3/uL (ref 3.4–10.8)

## 2018-10-07 LAB — VITAMIN D 25 HYDROXY (VIT D DEFICIENCY, FRACTURES): Vit D, 25-Hydroxy: 43.8 ng/mL (ref 30.0–100.0)

## 2018-10-07 LAB — MICROALBUMIN / CREATININE URINE RATIO
Creatinine, Urine: 96 mg/dL
Microalb/Creat Ratio: 5 mg/g creat (ref 0–29)
Microalbumin, Urine: 5.2 ug/mL

## 2018-10-07 LAB — HEPATIC FUNCTION PANEL
ALT: 25 IU/L (ref 0–32)
AST: 23 IU/L (ref 0–40)
Albumin: 4.3 g/dL (ref 3.7–4.7)
Alkaline Phosphatase: 86 IU/L (ref 39–117)
Bilirubin Total: 0.3 mg/dL (ref 0.0–1.2)
Bilirubin, Direct: 0.13 mg/dL (ref 0.00–0.40)
Total Protein: 6.8 g/dL (ref 6.0–8.5)

## 2018-10-07 LAB — LIPID PANEL
Chol/HDL Ratio: 3 ratio (ref 0.0–4.4)
Cholesterol, Total: 149 mg/dL (ref 100–199)
HDL: 50 mg/dL (ref >39)
LDL Calculated: 72 mg/dL (ref 0–99)
Triglycerides: 135 mg/dL (ref 0–149)
VLDL Cholesterol Cal: 27 mg/dL (ref 5–40)

## 2018-10-11 LAB — HGB A1C W/O EAG: Hgb A1c MFr Bld: 7 % — ABNORMAL HIGH (ref 4.8–5.6)

## 2018-10-11 LAB — SPECIMEN STATUS REPORT

## 2018-10-13 DIAGNOSIS — Z79899 Other long term (current) drug therapy: Secondary | ICD-10-CM | POA: Diagnosis not present

## 2018-10-13 DIAGNOSIS — G894 Chronic pain syndrome: Secondary | ICD-10-CM | POA: Diagnosis not present

## 2018-10-20 DIAGNOSIS — M25861 Other specified joint disorders, right knee: Secondary | ICD-10-CM | POA: Diagnosis not present

## 2018-11-01 DIAGNOSIS — Z885 Allergy status to narcotic agent status: Secondary | ICD-10-CM | POA: Diagnosis not present

## 2018-11-01 DIAGNOSIS — R42 Dizziness and giddiness: Secondary | ICD-10-CM | POA: Diagnosis not present

## 2018-11-01 DIAGNOSIS — Z881 Allergy status to other antibiotic agents status: Secondary | ICD-10-CM | POA: Diagnosis not present

## 2018-11-01 DIAGNOSIS — I10 Essential (primary) hypertension: Secondary | ICD-10-CM | POA: Diagnosis not present

## 2018-11-01 DIAGNOSIS — E119 Type 2 diabetes mellitus without complications: Secondary | ICD-10-CM | POA: Diagnosis not present

## 2018-11-01 DIAGNOSIS — Z880 Allergy status to penicillin: Secondary | ICD-10-CM | POA: Diagnosis not present

## 2018-11-02 ENCOUNTER — Other Ambulatory Visit: Payer: Self-pay

## 2018-11-02 ENCOUNTER — Ambulatory Visit: Payer: Medicare Other

## 2018-11-02 ENCOUNTER — Telehealth: Payer: Self-pay | Admitting: Family Medicine

## 2018-11-02 VITALS — Ht 66.0 in | Wt 203.0 lb

## 2018-11-02 DIAGNOSIS — Z86010 Personal history of colonic polyps: Secondary | ICD-10-CM

## 2018-11-02 MED ORDER — SUPREP BOWEL PREP KIT 17.5-3.13-1.6 GM/177ML PO SOLN: 1.0000 | Freq: Once | ORAL | 0 refills | Status: AC

## 2018-11-02 NOTE — Progress Notes (Signed)
Per pt, no allergies to soy or egg products.Pt not taking any weight loss meds or using  O2 at home.  Per pt, she states she woke up in the middle of the last colon in 2014.  Pt refused emmi video.  The PV was done over the phone due to COVID-19. Verified pt's address and insurance. Reviewed medical hx and prep instuctions with pt and will mail paperwork to her today. Informed pt to call with any questions or changes prior to her procedure. She understood.

## 2018-11-02 NOTE — Telephone Encounter (Signed)
Patient scheduled with PCP for tomorrow for hospital follow up

## 2018-11-03 ENCOUNTER — Telehealth: Payer: Self-pay | Admitting: Neurology

## 2018-11-03 ENCOUNTER — Ambulatory Visit (INDEPENDENT_AMBULATORY_CARE_PROVIDER_SITE_OTHER): Payer: Medicare Other | Admitting: Family Medicine

## 2018-11-03 ENCOUNTER — Encounter: Payer: Self-pay | Admitting: Family Medicine

## 2018-11-03 ENCOUNTER — Encounter: Payer: Self-pay | Admitting: Neurology

## 2018-11-03 ENCOUNTER — Ambulatory Visit (HOSPITAL_COMMUNITY)
Admission: RE | Admit: 2018-11-03 | Discharge: 2018-11-03 | Disposition: A | Discharge status: Home / Self Care | Payer: Medicare Other | Source: Ambulatory Visit | Attending: Neurology | Admitting: Neurology

## 2018-11-03 ENCOUNTER — Ambulatory Visit: Payer: Medicare Other | Admitting: Neurology

## 2018-11-03 VITALS — BP 114/68 | HR 89 | Temp 98.4°F | Ht 66.0 in | Wt 197.4 lb

## 2018-11-03 VITALS — BP 104/67 | HR 77 | Temp 98.1°F | Ht 66.0 in | Wt 197.5 lb

## 2018-11-03 DIAGNOSIS — I6621 Occlusion and stenosis of right posterior cerebral artery: Secondary | ICD-10-CM | POA: Diagnosis not present

## 2018-11-03 DIAGNOSIS — R42 Dizziness and giddiness: Secondary | ICD-10-CM

## 2018-11-03 DIAGNOSIS — G6289 Other specified polyneuropathies: Secondary | ICD-10-CM

## 2018-11-03 DIAGNOSIS — R2681 Unsteadiness on feet: Secondary | ICD-10-CM | POA: Diagnosis not present

## 2018-11-03 DIAGNOSIS — G3281 Cerebellar ataxia in diseases classified elsewhere: Secondary | ICD-10-CM

## 2018-11-03 DIAGNOSIS — I639 Cerebral infarction, unspecified: Secondary | ICD-10-CM | POA: Diagnosis not present

## 2018-11-03 DIAGNOSIS — Z8673 Personal history of transient ischemic attack (TIA), and cerebral infarction without residual deficits: Secondary | ICD-10-CM | POA: Insufficient documentation

## 2018-11-03 MED ORDER — MECLIZINE HCL 25 MG PO TABS: 25.0000 mg | ORAL_TABLET | Freq: Three times a day (TID) | ORAL | 0 refills | Status: DC | PRN

## 2018-11-03 MED ORDER — ONDANSETRON 4 MG PO TBDP: 4.0000 mg | ORAL_TABLET | Freq: Three times a day (TID) | ORAL | 0 refills | Status: DC | PRN

## 2018-11-03 NOTE — Telephone Encounter (Signed)
[employer] Medicare Auth: [id number] (exp. 11/03/18 to 05/01/19)  Patient is scheduled at [facility] for 11/03/18 arrival time is 10:30 am. Patient is aware of time and day. She is also aware when she goes to [place] to go into the building and take a right and that is where the MRI's are at.

## 2018-11-03 NOTE — Telephone Encounter (Signed)
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

## 2018-11-03 NOTE — Addendum Note (Signed)
Addended by: Marcial Pacas on: 11/03/2018 09:22 AM   Modules accepted: Orders

## 2018-11-03 NOTE — Telephone Encounter (Signed)
I have spoken with her husband on [place].  He verbalized understanding of the results.  He will make sure she taking aspirin 81mg  daily and keeps well hydrated.

## 2018-11-03 NOTE — Progress Notes (Signed)
PATIENT: [name] DOB: 03-07-1946  Chief Complaint  Patient presents with  . Numbness/Tremor    She is here with her husband, [name]. Reports numbness in her bilateral feet that has been present for 1-2 years.  She is also concerned about a left hand tremor that causes her mild problems when writing and eating.  Marland Kitchen PCP    Dettinger, Fransisca Kaufmann, MD     HISTORICAL  [name] is a 73 year old female, accompanied by her husband, seen in request by her primary care physician Dr. Warrick Parisian for evaluation of acute onset of unsteady gait, peripheral neuropathy, left hand tremor, initial evaluation was on November 03, 2018  I have reviewed and summarized the referring note from the referring physician.  She has past medical history of diabetes mellitus, hypertension, hyperlipidemia, depression anxiety on polypharmacy treatment, but not on antiplatelet agent.  She was diagnosed with Purtscher's retinopathy of both eye, was treated at the [facility], I reviewed ophthalmology evaluation by Dr.Weinstein in March 2020, she was treated with short course of steroid, symptoms has much improved, but has mild bilateral blurry vision  On October 31, 2018, while driving, she noticed sudden onset of dizziness, described as surrounding was moving, her body lean towards the right side, when she stepped out of the car, she noticed unsteady gait, leaning towards her right side, symptoms has been persistent since onset, with mild improvement, still has unsteady gait, walks like a drunk, she denies significant vertigo, no hearing loss no lateralized motor or sensory deficit  She also has symptoms of neuropathy since 2015, starting with bilateral plantar feet numbness, tingling, gradually getting worse, now to top of her foot, she denies significant gait abnormality, mild low back pain, no radiating pain to bilateral lower extremity, no incontinence, no upper extremity paresthesia.  She also noticed left hand  intermittent tremor since 2020, most noticeable when holding pen, no resting tremor, no loss sense of smell.  She has difficulty sleeping, frequent awakening at nighttime, but did not notice REM sleep disorder   REVIEW OF SYSTEMS: Full 14 system review of systems performed and notable only for as above All other review of systems were negative.  ALLERGIES: Allergies  Allergen Reactions  . Penicillins Other (See Comments)    Blisters in mouth Did it involve swelling of the face/tongue/throat, SOB, or low BP? No Did it involve sudden or severe rash/hives, skin peeling, or any reaction on the inside of your mouth or nose? No Did you need to seek medical attention at a hospital or doctor's office? No When did it last happen?Childhood allergy If all above answers are "NO", may proceed with cephalosporin use.   . Codeine Nausea And Vomiting  . Erythromycin Nausea And Vomiting  . Fenofibrate Other (See Comments)    aching & edema    . Lyrica [Pregabalin] Other (See Comments)    Edema in feet   . Statins Other (See Comments)    Joints ache  . Sulfa Antibiotics Nausea And Vomiting    HOME MEDICATIONS: Current Outpatient Medications  Medication Sig Dispense Refill  . ALPRAZolam (XANAX) 0.5 MG tablet Take 1-2 tabs daily as needed (Patient taking differently: Take 0.5 mg by mouth 2 (two) times daily as needed for anxiety. ) 135 tablet 1  . ARIPiprazole (ABILIFY) 2 MG tablet TAKE 1 TABLET DAILY (Patient taking differently: Take 1 mg by mouth daily. ) 90 tablet 0  . blood glucose meter kit and supplies KIT Dispense based on  patient and insurance preference. Use up to two times daily as directed. (Dx: type 2 DM - E11.9) 1 each 0  . Blood Glucose Monitoring Suppl DEVI Blood glucose meter One Touch Brand. Use to check BS up to BID. DX 250.02 1 each 0  . Cholecalciferol (VITAMIN D) 50 MCG (2000 UT) tablet Take 2,000 Units by mouth daily.     Marland Kitchen escitalopram (LEXAPRO) 20 MG tablet TAKE 1  TABLET DAILY (Patient taking differently: Take 20 mg by mouth daily. ) 90 tablet 1  . esomeprazole (NEXIUM) 40 MG capsule TAKE (1) CAPSULE TWICE DAILY. (Patient taking differently: Take 40 mg by mouth daily. ) 180 capsule 0  . ezetimibe (ZETIA) 10 MG tablet TAKE 1 TABLET DAILY (Patient taking differently: Take 10 mg by mouth daily. ) 90 tablet 0  . gabapentin (NEURONTIN) 600 MG tablet TAKE  (1)  TABLET  FOUR TIMES DAILY. (Patient taking differently: Take 600 mg by mouth 4 (four) times daily. ) 360 tablet 1  . glucose blood (ONE TOUCH ULTRA TEST) test strip CHECK BLOOD SUGAR UP TO 2 TIMES A DAY 200 each 3  . glucose monitoring kit (FREESTYLE) monitoring kit Please dispense whichever glucometer patient's insurance will cover.  Use to check BG up to twice.  DX: 250.02 1 each 0  . HYDROcodone-acetaminophen (NORCO/VICODIN) 5-325 MG tablet Take 1 tablet by mouth every 6 (six) hours as needed for moderate pain.    Marland Kitchen lisinopril-hydrochlorothiazide (PRINZIDE,ZESTORETIC) 20-25 MG tablet TAKE 1 TABLET DAILY (Patient taking differently: Take 1 tablet by mouth daily. ) 90 tablet 1  . metFORMIN (GLUCOPHAGE-XR) 500 MG 24 hr tablet Take 2 tablets (1,000 mg total) by mouth daily with breakfast. 180 tablet 2  . Misc Natural Products (LUTEIN 20 PO) Take by mouth daily.    . Omega-3 Fatty Acids (FISH OIL) 1000 MG CAPS Take by mouth. Take 2 pills daily    . ONETOUCH DELICA LANCETS 42V MISC CHECK BLOOD SUGAR UP TO TWICE DAILY 200 each 3  . OZEMPIC, 1 MG/DOSE, 2 MG/1.5ML SOPN Inject 1 mg as directed once a week. 3 mL 0  . Pitavastatin Calcium (LIVALO) 2 MG TABS TAKE (1) TABLET EVERY OTHER DAY. (Patient taking differently: Take 2 mg by mouth 3 (three) times a week. TAKE (1) TABLET EVERY OTHER DAY.) 45 tablet 1  . PREMARIN vaginal cream Place 1 Applicatorful vaginally at bedtime as needed. (Patient taking differently: Place 1 Applicatorful vaginally at bedtime as needed (for dryness). ) 30 g 5   No current  facility-administered medications for this visit.     PAST MEDICAL HISTORY: Past Medical History:  Diagnosis Date  . Allergy    seasonal  . Arthritis    [place]   . Colon polyps   . Complication of anesthesia    per pt, she woke up in the middle of last colon in 2014.  . Depression   . Diabetes mellitus, type 2 ([place]) 06/2010  . Diabetic neuropathy ([place])   . Diverticulosis    pt unaware  . Esophageal stricture   . Fibromyalgia    [place]   . GERD (gastroesophageal reflux disease)   . Hemorrhoids   . Hiatal hernia   . Hyperlipidemia   . Hypertension   . Menopause   . Numbness   . Peripheral vascular insufficiency ([place])   . Retinopathy    Bilateral  . Tremor   . Vitamin D deficiency     PAST SURGICAL HISTORY: Past Surgical History:  Procedure Laterality Date  .  ABDOMINAL HYSTERECTOMY     partial and then total  . ANKLE SURGERY Right   . COLONOSCOPY    . ELBOW SURGERY     left  . KNEE ARTHROSCOPY Right 09/12/2018   Procedure: ARTHROSCOPY KNEE; SYNOVECTOMY;  Surgeon: Gaynelle Arabian, MD;  Location: [facility];  Service: Orthopedics;  Laterality: Right;  68mn  . REFRACTIVE SURGERY  2003  . TOTAL KNEE ARTHROPLASTY     bilateral  . TOTAL KNEE REVISION Right 03/09/2018   Procedure: RIGHT TOTAL KNEE REVISION;  Surgeon: AGaynelle Arabian MD;  Location: [facility];  Service: Orthopedics;  Laterality: Right;  1281m with abductor block  . VESICOVAGINAL FISTULA CLOSURE W/ TAH  1981    FAMILY HISTORY: Family History  Problem Relation Age of Onset  . Diabetes Mother   . Heart failure Mother   . Hypertension Mother   . Cirrhosis Father   . Cirrhosis Brother     SOCIAL HISTORY: Social History   Socioeconomic History  . Marital status: Married    Spouse name: DaKasandra Haas. Number of children: 2  . Years of education: two years of college  . Highest education level: Not on file  Occupational History  . Occupation: Retired  [employer] laboratory technician. Financial resource strain: Not on  file  . Food insecurity    Worry: Not on file    Inability: Not on file  . Transportation needs    Medical: Not on file    Non-medical: Not on file  Tobacco Use  . Smoking status: Former Smoker    Packs/day: 1.00    Years: 10.00    Pack years: 10.00    Types: Cigarettes    Start date: 11/07/1975    Quit date: 06/24/2010    Years since quitting: 8.3  . Smokeless tobacco: Never Used  Substance and Sexual Activity  . Alcohol use: Not Currently  . Drug use: No  . Sexual activity: Yes    Birth control/protection: Surgical  Lifestyle  . Physical activity    Days per week: Not on file    Minutes per session: Not on file  . Stress: Not on file  Relationships  . Social coHerbalistn phone: Not on file    Gets together: Not on file    Attends religious service: Not on file    Active member of club or organization: Not on file    Attends meetings of clubs or organizations: Not on file    Relationship status: Not on file  . Intimate partner violence    Fear of current or ex partner: Not on file    Emotionally abused: Not on file    Physically abused: Not on file    Forced sexual activity: Not on file  Other Topics Concern  . Not on file  Social History Narrative   Lives at home with her husband.   Left-handed.   One can of Diet Coke per day.     PHYSICAL EXAM   Vitals:   11/03/18 0755  BP: 104/67  Pulse: 77  Temp: 98.1 F (36.7 C)  Weight: 197 lb 8 oz (89.6 kg)  Height: 5' 6"  (1.676 m)    Not recorded      Body mass index is 31.88 kg/m.  PHYSICAL EXAMNIATION:  Gen: NAD, conversant, well nourised, obese, well groomed                     Cardiovascular: Regular rate rhythm, no  peripheral edema, warm, nontender. Eyes: Conjunctivae clear without exudates or hemorrhage Neck: Supple, no carotid bruits. Pulmonary: Clear to auscultation bilaterally   NEUROLOGICAL EXAM:  MENTAL STATUS: Speech:    Speech is normal; fluent and spontaneous with normal  comprehension.  Cognition:     Orientation to time, place and person     Normal recent and remote memory     Normal Attention span and concentration     Normal Language, naming, repeating,spontaneous speech     Fund of knowledge   CRANIAL NERVES: CN II: Visual fields are full to confrontation.  Pupils are round equal and briskly reactive to light. CN III, IV, VI: extraocular movement are normal. No ptosis.  Mild end gaze nystagmus most noticeable to right side CN V: Facial sensation is intact to pinprick in all 3 divisions bilaterally. Corneal responses are intact.  CN VII: Face is symmetric with normal eye closure and smile. CN VIII: Hearing is normal to rubbing fingers CN IX, X: Palate elevates symmetrically. Phonation is normal. CN XI: Head turning and shoulder shrug are intact CN XII: Tongue is midline with normal movements and no atrophy.  MOTOR: There is no pronator drift of out-stretched arms. Muscle bulk and tone are normal. Muscle strength is normal.  REFLEXES: Reflexes are 2+ and symmetric at the biceps, triceps, knees, and ankles. Plantar responses are flexor.  SENSORY: Length dependent decreased to light touch, pinprick to distal shin level, preserved to toe vibratory sensation.  COORDINATION: she has mild right finger-to-nose dysmetria.  Truncal ataxia leaning towards the right side  GAIT/STANCE: She cannot get up from seated position arms crossed, wide-based, unsteady, could not perform tandem walking   DIAGNOSTIC DATA (LABS, IMAGING, TESTING) - I reviewed patient records, labs, notes, testing and imaging myself where available.   ASSESSMENT AND PLAN  JAMARA VARY is a 73 y.o. female   Acute onset of unsteady gait ataxia on October 31, 2018  Most worried about right cerebellum/brainstem stroke  Stat MRI of the brain, MRA of the brain and neck  Echocardiogram  Aspirin 81 mg daily  Peripheral neuropathy  Most consistent with her long history of diabetes  EMG  nerve conduction study   Marcial Pacas, M.D. Ph.D.  [facility] [address] [place], [place] [address] Ph: [phone] Fax: [phone]  CC: Dettinger, Fransisca Kaufmann, MD

## 2018-11-03 NOTE — Progress Notes (Signed)
BP 114/68   Pulse 89   Temp 98.4 F (36.9 C) (Temporal)   Ht 5\' 6"  (1.676 m)   Wt 197 lb 6.4 oz (89.5 kg)   BMI 31.86 kg/m    Subjective:   [name], female    DOB: Sep 23, 1945, 73 y.o.   MRN: [id number]  HPI: [name] is a 73 y.o. female presenting on 11/03/2018 for Dizziness (Patient was seen at [facility] x 2 days ago and went to see Neuro today.  Patient states that when she is sitting down she is not dizzy but when she walks she gets dizzy.)   HPI Patient is coming in for ER follow-up for dizziness and spinning sensation is been going on over the past couple days.  She is was seen in the ER and she saw neurologist earlier today.  The neurologist did an MRI which she had today because of her balance and the dizziness and spinning sensation.  The neurologist wanted to rule out anything like a stroke or anything like that because she has a history of that.  Patient denies any focal numbness or weakness but just feeling off balance and unsteady gait and spinning sensation.  Relevant past medical, surgical, family and social history reviewed and updated as indicated. Interim medical history since our last visit reviewed. Allergies and medications reviewed and updated.  Review of Systems  Constitutional: Negative for chills and fever.  HENT: Negative for congestion.   Eyes: Negative for redness and visual disturbance.  Respiratory: Negative for chest tightness and shortness of breath.   Cardiovascular: Negative for chest pain and leg swelling.  Gastrointestinal: Positive for nausea. Negative for abdominal pain, blood in stool and constipation.  Musculoskeletal: Negative for back pain and gait problem.  Skin: Negative for rash.  Neurological: Positive for dizziness. Negative for light-headedness and headaches.  Psychiatric/Behavioral: Negative for agitation and behavioral problems.  All other systems reviewed and are negative.   Per HPI unless specifically  indicated above      Objective:   BP 114/68   Pulse 89   Temp 98.4 F (36.9 C) (Temporal)   Ht 5\' 6"  (1.676 m)   Wt 197 lb 6.4 oz (89.5 kg)   BMI 31.86 kg/m   Wt Readings from Last 3 Encounters:  11/03/18 197 lb 6.4 oz (89.5 kg)  11/03/18 197 lb 8 oz (89.6 kg)  11/02/18 203 lb (92.1 kg)    Physical Exam Vitals signs and nursing note reviewed.  Constitutional:      General: She is not in acute distress.    Appearance: She is well-developed. She is not diaphoretic.  HENT:     Right Ear: Tympanic membrane normal.     Left Ear: Tympanic membrane normal.     Nose: No congestion or rhinorrhea.     Mouth/Throat:     Mouth: Mucous membranes are moist.     Pharynx: No posterior oropharyngeal erythema.  Eyes:     Conjunctiva/sclera: Conjunctivae normal.  Cardiovascular:     Rate and Rhythm: Normal rate and regular rhythm.     Heart sounds: Normal heart sounds. No murmur.  Pulmonary:     Effort: Pulmonary effort is normal. No respiratory distress.     Breath sounds: Normal breath sounds. No wheezing.  Musculoskeletal: Normal range of motion.        General: No tenderness.  Skin:    General: Skin is warm and dry.     Findings: No rash.  Neurological:     Mental Status: She is alert and oriented to person, place, and time.     Cranial Nerves: No cranial nerve deficit.     Sensory: No sensory deficit.     Motor: No weakness.     Coordination: Coordination normal.     Gait: Gait abnormal.  Psychiatric:        Behavior: Behavior normal.     MRI from this morning shows vessel changes and PCA stenosis, she will follow-up neurology for this.  We will attempt to treat like vertigo and see if it improves  Assessment & Plan:   Problem List Items Addressed This Visit    None    Visit Diagnoses    Vertigo    -  Primary   Relevant Medications   meclizine (ANTIVERT) 25 MG tablet   ondansetron (ZOFRAN ODT) 4 MG disintegrating tablet       Follow up plan: Return in about  4 months (around 03/06/2019), or if symptoms worsen or fail to improve, for Patient is to follow-up with chronic issues and October November.  Counseling provided for all of the vaccine components No orders of the defined types were placed in this encounter.   Caryl Pina, MD [facility] 11/03/2018, 4:35 PM

## 2018-11-04 ENCOUNTER — Other Ambulatory Visit: Payer: Self-pay | Admitting: *Deleted

## 2018-11-04 MED ORDER — OZEMPIC (1 MG/DOSE) 2 MG/1.5ML ~~LOC~~ SOPN: 1.0000 mg | PEN_INJECTOR | SUBCUTANEOUS | 2 refills | Status: DC

## 2018-11-10 ENCOUNTER — Telehealth: Payer: Self-pay | Admitting: Neurology

## 2018-11-10 ENCOUNTER — Ambulatory Visit (HOSPITAL_COMMUNITY)
Admission: RE | Admit: 2018-11-10 | Discharge: 2018-11-10 | Disposition: A | Discharge status: Home / Self Care | Payer: Medicare Other | Source: Ambulatory Visit | Attending: Neurology | Admitting: Neurology

## 2018-11-10 ENCOUNTER — Ambulatory Visit (HOSPITAL_BASED_OUTPATIENT_CLINIC_OR_DEPARTMENT_OTHER)
Admission: RE | Admit: 2018-11-10 | Discharge: 2018-11-10 | Disposition: A | Discharge status: Home / Self Care | Payer: Medicare Other | Source: Ambulatory Visit | Attending: Neurology | Admitting: Neurology

## 2018-11-10 ENCOUNTER — Other Ambulatory Visit: Payer: Self-pay

## 2018-11-10 DIAGNOSIS — Z87891 Personal history of nicotine dependence: Secondary | ICD-10-CM | POA: Insufficient documentation

## 2018-11-10 DIAGNOSIS — G6289 Other specified polyneuropathies: Secondary | ICD-10-CM

## 2018-11-10 DIAGNOSIS — R2681 Unsteadiness on feet: Secondary | ICD-10-CM

## 2018-11-10 DIAGNOSIS — G3281 Cerebellar ataxia in diseases classified elsewhere: Secondary | ICD-10-CM | POA: Insufficient documentation

## 2018-11-10 DIAGNOSIS — I10 Essential (primary) hypertension: Secondary | ICD-10-CM | POA: Insufficient documentation

## 2018-11-10 DIAGNOSIS — I700 Atherosclerosis of aorta: Secondary | ICD-10-CM | POA: Diagnosis not present

## 2018-11-10 DIAGNOSIS — E119 Type 2 diabetes mellitus without complications: Secondary | ICD-10-CM | POA: Diagnosis not present

## 2018-11-10 DIAGNOSIS — I313 Pericardial effusion (noninflammatory): Secondary | ICD-10-CM | POA: Diagnosis not present

## 2018-11-10 DIAGNOSIS — I639 Cerebral infarction, unspecified: Secondary | ICD-10-CM

## 2018-11-10 NOTE — Progress Notes (Signed)
  Echocardiogram 2D Echocardiogram has been performed.  [name] 11/10/2018, 10:37 AM

## 2018-11-10 NOTE — Telephone Encounter (Signed)
Left vm for patient to call back about carotid ultrasound artery test.

## 2018-11-10 NOTE — Telephone Encounter (Signed)
[place] of carotid arteries showed less than 39% stenosis bilaterally, bilateral vertebral arteries are anterograde flow  Summary: Right Carotid: Velocities in the right ICA are consistent with a 1-39% stenosis.  Left Carotid: Velocities in the left ICA are consistent with a 1-39% stenosis.  Vertebrals: Bilateral vertebral arteries demonstrate antegrade flow.  *See table(s) above for measurements and observations.

## 2018-11-15 ENCOUNTER — Telehealth: Payer: Self-pay | Admitting: Internal Medicine

## 2018-11-15 NOTE — Telephone Encounter (Signed)
I have spoken with the patient.  She is aware of her carotid ultrasound results.  She would like to also check on her ECHO results.

## 2018-11-15 NOTE — Telephone Encounter (Signed)
Please call patient, echocardiogram showed ejection fraction more than 65%, hyperdynamic, mild poor relaxation, no significant wall motion abnormality, this is most consistent with her hypertension,

## 2018-11-15 NOTE — Telephone Encounter (Signed)

## 2018-11-15 NOTE — Telephone Encounter (Signed)
I called the patient again and her ECHO results have been reviewed with her.

## 2018-11-15 NOTE — Telephone Encounter (Signed)
Pt returning call please call back °

## 2018-11-16 ENCOUNTER — Other Ambulatory Visit: Payer: Self-pay

## 2018-11-16 ENCOUNTER — Encounter: Payer: Self-pay | Admitting: Internal Medicine

## 2018-11-16 ENCOUNTER — Ambulatory Visit (AMBULATORY_SURGERY_CENTER): Payer: Medicare Other | Admitting: Internal Medicine

## 2018-11-16 VITALS — BP 107/49 | HR 74 | Temp 97.8°F | Resp 19 | Ht 66.0 in | Wt 203.0 lb

## 2018-11-16 DIAGNOSIS — Z86010 Personal history of colonic polyps: Secondary | ICD-10-CM | POA: Diagnosis not present

## 2018-11-16 DIAGNOSIS — Z1211 Encounter for screening for malignant neoplasm of colon: Secondary | ICD-10-CM | POA: Diagnosis not present

## 2018-11-16 MED ORDER — SODIUM CHLORIDE 0.9 % IV SOLN: 500.0000 mL | Freq: Once | INTRAVENOUS | Status: DC

## 2018-11-16 NOTE — Patient Instructions (Signed)
Discharge instructions given. Handout on Diverticulosis. Resume previous medications. YOU HAD AN ENDOSCOPIC PROCEDURE TODAY AT THE [facility]:   Refer to the procedure report that was given to you for any specific questions about what was found during the examination.  If the procedure report does not answer your questions, please call your gastroenterologist to clarify.  If you requested that your care partner not be given the details of your procedure findings, then the procedure report has been included in a sealed envelope for you to review at your convenience later.  YOU SHOULD EXPECT: Some feelings of bloating in the abdomen. Passage of more gas than usual.  Walking can help get rid of the air that was put into your GI tract during the procedure and reduce the bloating. If you had a lower endoscopy (such as a colonoscopy or flexible sigmoidoscopy) you may notice spotting of blood in your stool or on the toilet paper. If you underwent a bowel prep for your procedure, you may not have a normal bowel movement for a few days.  Please Note:  You might notice some irritation and congestion in your nose or some drainage.  This is from the oxygen used during your procedure.  There is no need for concern and it should clear up in a day or so.  SYMPTOMS TO REPORT IMMEDIATELY:   Following lower endoscopy (colonoscopy or flexible sigmoidoscopy):  Excessive amounts of blood in the stool  Significant tenderness or worsening of abdominal pains  Swelling of the abdomen that is new, acute  Fever of 100F or higher   For urgent or emergent issues, a gastroenterologist can be reached at any hour by calling [phone].   DIET:  We do recommend a small meal at first, but then you may proceed to your regular diet.  Drink plenty of fluids but you should avoid alcoholic beverages for 24 hours.  ACTIVITY:  You should plan to take it easy for the rest of today and you should NOT DRIVE or use  heavy machinery until tomorrow (because of the sedation medicines used during the test).    FOLLOW UP: Our staff will call the number listed on your records 48-72 hours following your procedure to check on you and address any questions or concerns that you may have regarding the information given to you following your procedure. If we do not reach you, we will leave a message.  We will attempt to reach you two times.  During this call, we will ask if you have developed any symptoms of COVID 19. If you develop any symptoms (ie: fever, flu-like symptoms, shortness of breath, cough etc.) before then, please call [phone].  If you test positive for Covid 19 in the 2 weeks post procedure, please call and report this information to us.    If any biopsies were taken you will be contacted by phone or by letter within the next 1-3 weeks.  Please call us at [phone] if you have not heard about the biopsies in 3 weeks.    SIGNATURES/CONFIDENTIALITY: You and/or your care partner have signed paperwork which will be entered into your electronic medical record.  These signatures attest to the fact that that the information above on your After Visit Summary has been reviewed and is understood.  Full responsibility of the confidentiality of this discharge information lies with you and/or your care-partner. 

## 2018-11-16 NOTE — Progress Notes (Signed)
Pt. Reports no change in her medical or surgical history since her pre-visit 11/03/2018.

## 2018-11-16 NOTE — Op Note (Signed)
[place] Patient Name: [name] Procedure Date: 11/16/2018 10:11 AM MRN: [id number] Endoscopist: Docia Chuck. Henrene Pastor , MD Age: 73 Referring MD:  Date of Birth: 13-Nov-1945 Gender: Female Account #: [id number] Procedure:                Colonoscopy Indications:              High risk colon cancer surveillance: Personal                            history of adenoma less than 10 mm in size.                            Previous examinations 2005, 2011, 2014 Medicines:                Monitored Anesthesia Care Procedure:                Pre-Anesthesia Assessment:                           - Prior to the procedure, a History and Physical                            was performed, and patient medications and                            allergies were reviewed. The patient's tolerance of                            previous anesthesia was also reviewed. The risks                            and benefits of the procedure and the sedation                            options and risks were discussed with the patient.                            All questions were answered, and informed consent                            was obtained. Prior Anticoagulants: The patient has                            taken no previous anticoagulant or antiplatelet                            agents. ASA Grade Assessment: II - A patient with                            mild systemic disease. After reviewing the risks                            and benefits, the patient was deemed in  satisfactory condition to undergo the procedure.                           After obtaining informed consent, the colonoscope                            was passed under direct vision. Throughout the                            procedure, the patient's blood pressure, pulse, and                            oxygen saturations were monitored continuously. The                            Colonoscope was introduced through  the anus and                            advanced to the the cecum, identified by                            appendiceal orifice and ileocecal valve. The                            ileocecal valve, appendiceal orifice, and rectum                            were photographed. The quality of the bowel                            preparation was excellent. The colonoscopy was                            performed without difficulty. The patient tolerated                            the procedure well. The bowel preparation used was                            SUPREP via split dose instruction. Scope In: 10:23:59 AM Scope Out: 10:37:41 AM Scope Withdrawal Time: 0 hours 11 minutes 22 seconds  Total Procedure Duration: 0 hours 13 minutes 42 seconds  Findings:                 A few diverticula were found in the sigmoid colon.                           The exam was otherwise without abnormality on                            direct and retroflexion views. Complications:            No immediate complications. Estimated blood loss:  None. Estimated Blood Loss:     Estimated blood loss: none. Impression:               - Diverticulosis in the sigmoid colon.                           - The examination was otherwise normal on direct                            and retroflexion views.                           - No specimens collected. Recommendation:           - Repeat colonoscopy is not recommended for                            surveillance (current age, favorable findings,                            recent guidelines).                           - Patient has a contact number available for                            emergencies. The signs and symptoms of potential                            delayed complications were discussed with the                            patient. Return to normal activities tomorrow.                            Written discharge instructions were provided  to the                            patient.                           - Resume previous diet.                           - Continue present medications. Docia Chuck. Henrene Pastor, MD 11/16/2018 10:42:34 AM This report has been signed electronically.

## 2018-11-16 NOTE — Progress Notes (Signed)
To PACU, VSS. Report to [email or web address] 

## 2018-11-18 ENCOUNTER — Telehealth: Payer: Self-pay

## 2018-11-18 NOTE — Telephone Encounter (Signed)
  Follow up Call-  Call back number 11/16/2018  Post procedure Call Back phone  # [phone]  Permission to leave phone message Yes  Some recent data might be hidden     Patient questions:  Do you have a fever, pain , or abdominal swelling? No. Pain Score  0 *  Have you tolerated food without any problems? Yes.    Have you been able to return to your normal activities? Yes.    Do you have any questions about your discharge instructions: Diet   no Medications  No. Follow up visit  No.  Do you have questions or concerns about your Care? No.  Actions: * If pain score is 4 or above: No action needed, pain <4. 1. Have you developed a fever since your procedure? no  2.   Have you had an respiratory symptoms (SOB or cough) since your procedure? no  3.   Have you tested positive for COVID 19 since your procedure no  4.   Have you had any family members/close contacts diagnosed with the COVID 19 since your procedure?  no   If yes to any of these questions please route to Joylene John, RN and Alphonsa Gin, Therapist, sports.

## 2018-11-23 ENCOUNTER — Other Ambulatory Visit: Payer: Self-pay | Admitting: Family Medicine

## 2018-12-06 ENCOUNTER — Other Ambulatory Visit: Payer: Self-pay | Admitting: Family Medicine

## 2018-12-14 ENCOUNTER — Ambulatory Visit (INDEPENDENT_AMBULATORY_CARE_PROVIDER_SITE_OTHER): Payer: Medicare Other | Admitting: *Deleted

## 2018-12-14 DIAGNOSIS — Z0000 Encounter for general adult medical examination without abnormal findings: Secondary | ICD-10-CM

## 2018-12-14 NOTE — Progress Notes (Signed)
MEDICARE ANNUAL WELLNESS VISIT  12/14/2018  Telephone Visit Disclaimer This Medicare AWV was conducted by telephone due to national recommendations for restrictions regarding the COVID-19 Pandemic (e.g. social distancing).  I verified, using two identifiers, that I am speaking with [name] or their authorized healthcare agent. I discussed the limitations, risks, security, and privacy concerns of performing an evaluation and management service by telephone and the potential availability of an in-person appointment in the future. The patient expressed understanding and agreed to proceed.   Subjective:  [name] is a 73 y.o. female patient of Dettinger, Mary Kaufmann, MD who had a Medicare Annual Wellness Visit today via telephone. Caly is Retired and lives with their spouse. she has 2 children. she reports that she is socially active and does interact with friends/family regularly. she is minimally physically active and enjoys shopping.  Patient Care Team: Dettinger, Mary Kaufmann, MD as PCP - General (Family Medicine) Buford Dresser, MD as PCP - Cardiology (Cardiology) Irene Shipper, MD as Consulting Physician (Gastroenterology) Sheralyn Boatman, MD as Consulting Physician (Psychiatry) Suella Broad, MD as Consulting Physician (Physical Medicine and Rehabilitation) Marica Otter, [place] as Consulting Physician (Optometry)  Advanced Directives 12/14/2018 09/08/2018 03/09/2018 03/01/2018 04/30/2016 02/12/2016 03/11/2015  Does Patient Have a Medical Advance Directive? No Yes No No Yes Yes Yes  Type of Advance Directive - Living will;Healthcare Power of [place];Living will - [place];Living will  Does patient want to make changes to medical advance directive? - No - Patient declined - - No - Patient declined - No - Patient declined  Copy of [place] in Chart? - - - - No - copy requested - No - copy requested  Would  patient like information on creating a medical advance directive? No - Patient declined - No - Patient declined No - Patient declined - - [facility] Utilization Over the Past 12 Months: # of hospitalizations or ER visits: 2 # of surgeries: 1  Review of Systems    Patient reports that her overall health is unchanged compared to last year.  History obtained from chart review  Patient Reported Readings (BP, Pulse, CBG, Weight, etc) none  Pain Assessment Pain : No/denies pain     Current Medications & Allergies (verified) Allergies as of 12/14/2018      Reactions   Penicillins Other (See Comments)   Blisters in mouth Did it involve swelling of the face/tongue/throat, SOB, or low BP? No Did it involve sudden or severe rash/hives, skin peeling, or any reaction on the inside of your mouth or nose? No Did you need to seek medical attention at a hospital or doctor's office? No When did it last happen?Childhood allergy If all above answers are "NO", may proceed with cephalosporin use.   Codeine Nausea And Vomiting   Erythromycin Nausea And Vomiting   Fenofibrate Other (See Comments)   aching & edema    Lyrica [pregabalin] Other (See Comments)   Edema in feet   Statins Other (See Comments)   Joints ache   Sulfa Antibiotics Nausea And Vomiting      Medication List       Accurate as of December 14, 2018  1:44 PM. If you have any questions, ask your nurse or doctor.        STOP taking these medications   meclizine 25 MG tablet Commonly known as: ANTIVERT   ondansetron 4 MG disintegrating tablet  Commonly known as: Zofran ODT     TAKE these medications   ALPRAZolam 0.5 MG tablet Commonly known as: XANAX Take 1-2 tabs daily as needed What changed:   how much to take  how to take this  when to take this  reasons to take this  additional instructions   ARIPiprazole 2 MG tablet Commonly known as: ABILIFY Take 0.5 tablets (1 mg total) by mouth daily.    aspirin EC 81 MG tablet Take 81 mg by mouth daily.   blood glucose meter kit and supplies Kit Dispense based on patient and insurance preference. Use up to two times daily as directed. (Dx: type 2 DM - E11.9)   escitalopram 20 MG tablet Commonly known as: LEXAPRO TAKE 1 TABLET DAILY   esomeprazole 40 MG capsule Commonly known as: NEXIUM TAKE (1) CAPSULE TWICE DAILY. What changed: See the new instructions.   ezetimibe 10 MG tablet Commonly known as: ZETIA TAKE 1 TABLET DAILY   Fish Oil 1000 MG Caps Take by mouth. Take 2 pills daily   gabapentin 600 MG tablet Commonly known as: NEURONTIN TAKE  (1)  TABLET  FOUR TIMES DAILY. What changed: See the new instructions.   glucose blood test strip Commonly known as: ONE TOUCH ULTRA TEST CHECK BLOOD SUGAR UP TO 2 TIMES A DAY   glucose monitoring kit monitoring kit Please dispense whichever glucometer patient's insurance will cover.  Use to check BG up to twice.  DX: 250.02   Blood Glucose Monitoring Suppl Devi Blood glucose meter One Touch Brand. Use to check BS up to BID. DX 250.02   HYDROcodone-acetaminophen 5-325 MG tablet Commonly known as: NORCO/VICODIN Take 1 tablet by mouth every 6 (six) hours as needed for moderate pain.   lisinopril-hydrochlorothiazide 20-25 MG tablet Commonly known as: ZESTORETIC TAKE 1 TABLET DAILY   LUTEIN 20 PO Take by mouth daily.   metFORMIN 500 MG 24 hr tablet Commonly known as: GLUCOPHAGE-XR Take 2 tablets (1,000 mg total) by mouth daily with breakfast.   OneTouch Delica Lancets 16W Misc CHECK BLOOD SUGAR UP TO TWICE DAILY   Ozempic (1 MG/DOSE) 2 MG/1.5ML Sopn Generic drug: Semaglutide (1 MG/DOSE) Inject 1 mg into the skin once a week.   Pitavastatin Calcium 2 MG Tabs Commonly known as: Livalo TAKE (1) TABLET EVERY OTHER DAY. What changed:   how much to take  how to take this  when to take this   Premarin vaginal cream Generic drug: conjugated estrogens Place 1  Applicatorful vaginally at bedtime as needed.   Vitamin D 50 MCG (2000 UT) tablet Take 2,000 Units by mouth daily.       History (reviewed): Past Medical History:  Diagnosis Date  . Allergy    seasonal  . Arthritis    [place]   . Colon polyps   . Complication of anesthesia    per pt, she woke up in the middle of last colon in 2014.  . Depression   . Diabetes mellitus, type 2 ([place]) 06/2010  . Diabetic neuropathy ([place])   . Diverticulosis    pt unaware  . Esophageal stricture   . Fibromyalgia    [place]   . GERD (gastroesophageal reflux disease)   . Hemorrhoids   . Hiatal hernia   . Hyperlipidemia   . Hypertension   . Menopause   . Numbness   . Peripheral vascular insufficiency ([place])   . Retinopathy    Bilateral  . Tremor   . Vitamin D deficiency  Past Surgical History:  Procedure Laterality Date  . ABDOMINAL HYSTERECTOMY     partial and then total  . ANKLE SURGERY Right   . COLONOSCOPY    . ELBOW SURGERY     left  . KNEE ARTHROSCOPY Right 09/12/2018   Procedure: ARTHROSCOPY KNEE; SYNOVECTOMY;  Surgeon: Gaynelle Arabian, MD;  Location: [facility];  Service: Orthopedics;  Laterality: Right;  34mn  . REFRACTIVE SURGERY  2003  . TOTAL KNEE ARTHROPLASTY     bilateral  . TOTAL KNEE REVISION Right 03/09/2018   Procedure: RIGHT TOTAL KNEE REVISION;  Surgeon: AGaynelle Arabian MD;  Location: [facility];  Service: Orthopedics;  Laterality: Right;  1282m with abductor block  . VESICOVAGINAL FISTULA CLOSURE W/ TAH  1981   Family History  Problem Relation Age of Onset  . Diabetes Mother   . Heart failure Mother   . Hypertension Mother   . Cirrhosis Father   . Cirrhosis Brother    Social History   Socioeconomic History  . Marital status: Married    Spouse name: DaKasandra Knudsen. Number of children: 2  . Years of education: two years of college  . Highest education level: Some college, no degree  Occupational History  . Occupation: Retired  [employer] laboratory technician. Financial  resource strain: Not hard at all  . Food insecurity    Worry: Never true    Inability: Never true  . Transportation needs    Medical: No    Non-medical: No  Tobacco Use  . Smoking status: Former Smoker    Packs/day: 1.00    Years: 10.00    Pack years: 10.00    Types: Cigarettes    Start date: 11/07/1975    Quit date: 06/24/2010    Years since quitting: 8.4  . Smokeless tobacco: Never Used  Substance and Sexual Activity  . Alcohol use: Not Currently  . Drug use: No  . Sexual activity: Yes    Birth control/protection: Surgical  Lifestyle  . Physical activity    Days per week: 0 days    Minutes per session: 0 min  . Stress: Not at all  Relationships  . Social connections    Talks on phone: More than three times a week    Gets together: More than three times a week    Attends religious service: More than 4 times per year    Active member of club or organization: Yes    Attends meetings of clubs or organizations: More than 4 times per year    Relationship status: Married  Other Topics Concern  . Not on file  Social History Narrative   Lives at home with her husband.   Left-handed.   One can of Diet Coke per day.    Activities of Daily Living In your present state of health, do you have any difficulty performing the following activities: 12/14/2018 09/08/2018  Hearing? N N  Vision? Y N  Comment she can't see as well as she used to and does wear reading glasses, does get yearly eye exam -  Difficulty concentrating or making decisions? N N  Walking or climbing stairs? Y Y  Comment due to her knee replacement knee pain  Dressing or bathing? N N  Doing errands, shopping? N N  Preparing Food and eating ? N -  Using the Toilet? N -  In the past six months, have you accidently leaked urine? Y -  Comment sometimes if she has to go really bad and she  can't get to the bathroom in time she will leak a little -  Do you have problems with loss of bowel control? N -  Managing your  Medications? N -  Managing your Finances? N -  Housekeeping or managing your Housekeeping? 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   Depression Screen PHQ 2/9 Scores 12/14/2018 11/03/2018 06/13/2018 05/17/2018 12/31/2017 12/08/2017 06/10/2017  PHQ - 2 Score 0 3 1 1  0 0 0  PHQ- 9 Score - 9 - - - - -     Fall Risk Fall Risk  12/14/2018 11/03/2018 06/13/2018 05/17/2018 12/31/2017  Falls in the past year? 1 1 1 1  Yes  Number falls in past yr: 0 0 0 0 2 or more  Injury with Fall? 0 0 1 1 -  Follow up Falls prevention discussed - - - -  Comment get rid of all throw rugs in the house, adequate lighting in the walkways and grab bars in the bathroom - - - -     Objective:  [name] seemed alert and oriented and she participated appropriately during our telephone visit.  Blood Pressure Weight BMI  BP Readings from Last 3 Encounters:  11/16/18 (!) 107/49  11/03/18 114/68  11/03/18 104/67   Wt Readings from Last 3 Encounters:  11/16/18 203 lb (92.1 kg)  11/03/18 197 lb 6.4 oz (89.5 kg)  11/03/18 197 lb 8 oz (89.6 kg)   BMI Readings from Last 1 Encounters:  11/16/18 32.77 kg/m    *Unable to obtain current vital signs, weight, and BMI due to telephone visit type  Hearing/Vision  . Layni did not seem to have  difficulty with hearing/understanding during the telephone conversation . Reports that she has had a formal eye exam by an eye care professional within the past year . Reports that she has not had a formal hearing evaluation within the past year *Unable to fully assess hearing and vision during telephone visit type  Cognitive Function: 6CIT Screen 12/14/2018  What Year? 0 points  What month? 0 points  What time? 0 points  Count back from 20 0 points  Months in reverse 2 points  Repeat phrase 0 points  Total Score 2   (Normal:0-7, Significant for Dysfunction: >8)  Normal Cognitive Function Screening: Yes   Immunization & Health Maintenance Record Immunization History  Administered Date(s) Administered  . H1N1 03/26/2008  . Influenza, High Dose Seasonal PF 01/29/2016, 12/31/2017  . Influenza,inj,Quad PF,6+ Mos 01/30/2013, 01/14/2015  . Influenza,inj,quad, With Preservative 01/04/2014, 01/27/2017  . Influenza-Unspecified 01/27/2010, 02/05/2011, 01/19/2012, 02/04/2017  . Pneumococcal Conjugate-13 02/28/2013  . Pneumococcal Polysaccharide-23 02/05/2011  . Tdap 09/03/2016  . Zoster 08/08/2009  . Zoster Recombinat (Shingrix) 11/26/2018    Health Maintenance  Topic Date Due  . COLON CANCER SCREENING ANNUAL FOBT  01/15/2016  . OPHTHALMOLOGY EXAM  10/07/2018  . INFLUENZA VACCINE  11/05/2018  . HEMOGLOBIN A1C  04/08/2019  . FOOT EXAM  05/18/2019  . MAMMOGRAM  09/14/2019  . DEXA SCAN  12/09/2019  . COLONOSCOPY  11/16/2023  . TETANUS/TDAP  09/04/2026  . Hepatitis C Screening  Completed  . PNA vac Low Risk Adult  Completed       Assessment  This is a routine wellness examination for [name].  Health Maintenance: Due or Overdue Health Maintenance Due  Topic Date Due  . COLON CANCER SCREENING ANNUAL FOBT  01/15/2016  . OPHTHALMOLOGY EXAM  10/07/2018  . INFLUENZA VACCINE  11/05/2018    [name] does not need a referral for Community Assistance: Care Management:    no Social Work:    no Prescription Assistance:  no Nutrition/Diabetes Education:  no   Plan:  Personalized Goals Goals Addressed            This Visit's Progress   . DIET - INCREASE WATER INTAKE       Try to drink 6-8 glasses of water daily.      Personalized Health Maintenance & Screening Recommendations  Influenza vaccine Colorectal cancer screening  Lung Cancer Screening Recommended: no (Low Dose CT Chest recommended if Age 61-80 years, 30 pack-year currently smoking OR have quit w/in past 15 years) Hepatitis C Screening recommended: no HIV Screening recommended: no  Advanced Directives: Written information was not prepared per patient's request.  Referrals & Orders No orders of the defined types were placed in this encounter.   Follow-up Plan . Follow-up with Dettinger, Mary Kaufmann, MD as planned . Schedule your Diabetic Eye Exam as discussed . Get your Flu vaccine at your next visit with your PCP   I have personally reviewed and noted the following in the patient's chart:   . Medical and social history . Use of alcohol, tobacco or illicit drugs  . Current medications and supplements . Functional ability and status . Nutritional status . Physical activity . Advanced directives . List of other physicians . Hospitalizations, surgeries, and ER visits in previous 12 months . Vitals . Screenings to include cognitive, depression, and falls . Referrals and appointments  In addition, I have reviewed and discussed with [name] certain preventive protocols, quality metrics, and best practice recommendations. A written personalized care plan for preventive services as well as general preventive health recommendations is available and can be mailed to the patient at her request.      Marylin Crosby, LPN  04/09/7090

## 2018-12-14 NOTE — Patient Instructions (Signed)
Preventive Care 73 Years and Older, Female Preventive care refers to lifestyle choices and visits with your health care provider that can promote health and wellness. This includes:  A yearly physical exam. This is also called an annual well check.  Regular dental and eye exams.  Immunizations.  Screening for certain conditions.  Healthy lifestyle choices, such as diet and exercise. What can I expect for my preventive care visit? Physical exam Your health care provider will check:  Height and weight. These may be used to calculate body mass index (BMI), which is a measurement that tells if you are at a healthy weight.  Heart rate and blood pressure.  Your skin for abnormal spots. Counseling Your health care provider may ask you questions about:  Alcohol, tobacco, and drug use.  Emotional well-being.  Home and relationship well-being.  Sexual activity.  Eating habits.  History of falls.  Memory and ability to understand (cognition).  Work and work Statistician.  Pregnancy and menstrual history. What immunizations do I need?  Influenza (flu) vaccine  This is recommended every year. Tetanus, diphtheria, and pertussis (Tdap) vaccine  You may need a Td booster every 10 years. Varicella (chickenpox) vaccine  You may need this vaccine if you have not already been vaccinated. Zoster (shingles) vaccine  You may need this after age 73. Pneumococcal conjugate (PCV13) vaccine  One dose is recommended after age 73. Pneumococcal polysaccharide (PPSV23) vaccine  One dose is recommended after age 73. Measles, mumps, and rubella (MMR) vaccine  You may need at least one dose of MMR if you were born in 1957 or later. You may also need a second dose. Meningococcal conjugate (MenACWY) vaccine  You may need this if you have certain conditions. Hepatitis A vaccine  You may need this if you have certain conditions or if you travel or work in places where you may be exposed  to hepatitis A. Hepatitis B vaccine  You may need this if you have certain conditions or if you travel or work in places where you may be exposed to hepatitis B. Haemophilus influenzae type b (Hib) vaccine  You may need this if you have certain conditions. You may receive vaccines as individual doses or as more than one vaccine together in one shot (combination vaccines). Talk with your health care provider about the risks and benefits of combination vaccines. What tests do I need? Blood tests  Lipid and cholesterol levels. These may be checked every 5 years, or more frequently depending on your overall health.  Hepatitis C test.  Hepatitis B test. Screening  Lung cancer screening. You may have this screening every year starting at age 39 if you have a 30-pack-year history of smoking and currently smoke or have quit within the past 15 years.  Colorectal cancer screening. All adults should have this screening starting at age 36 and continuing until age 15. Your health care provider may recommend screening at age 23 if you are at increased risk. You will have tests every 1-10 years, depending on your results and the type of screening test.  Diabetes screening. This is done by checking your blood sugar (glucose) after you have not eaten for a while (fasting). You may have this done every 1-3 years.  Mammogram. This may be done every 1-2 years. Talk with your health care provider about how often you should have regular mammograms.  BRCA-related cancer screening. This may be done if you have a family history of breast, ovarian, tubal, or peritoneal cancers.  Other tests  Sexually transmitted disease (STD) testing.  Bone density scan. This is done to screen for osteoporosis. You may have this done starting at age 55. Follow these instructions at home: Eating and drinking  Eat a diet that includes fresh fruits and vegetables, whole grains, lean protein, and low-fat dairy products. Limit  your intake of foods with high amounts of sugar, saturated fats, and salt.  Take vitamin and mineral supplements as recommended by your health care provider.  Do not drink alcohol if your health care provider tells you not to drink.  If you drink alcohol: ? Limit how much you have to 0-1 drink a day. ? Be aware of how much alcohol is in your drink. In the [place] one drink equals one 12 oz bottle of beer (355 mL), one 5 oz glass of wine (148 mL), or one 1 oz glass of hard liquor (44 mL). Lifestyle  Take daily care of your teeth and gums.  Stay active. Exercise for at least 30 minutes on 5 or more days each week.  Do not use any products that contain nicotine or tobacco, such as cigarettes, e-cigarettes, and chewing tobacco. If you need help quitting, ask your health care provider.  If you are sexually active, practice safe sex. Use a condom or other form of protection in order to prevent STIs (sexually transmitted infections).  Talk with your health care provider about taking a low-dose aspirin or statin. What's next?  Go to your health care provider once a year for a well check visit.  Ask your health care provider how often you should have your eyes and teeth checked.  Stay up to date on all vaccines. This information is not intended to replace advice given to you by your health care provider. Make sure you discuss any questions you have with your health care provider. Document Released: 04/19/2015 Document Revised: 03/17/2018 Document Reviewed: 03/17/2018 Elsevier Patient Education  2020 [employer].

## 2018-12-20 ENCOUNTER — Other Ambulatory Visit: Payer: Self-pay | Admitting: Family Medicine

## 2018-12-28 DIAGNOSIS — L570 Actinic keratosis: Secondary | ICD-10-CM | POA: Diagnosis not present

## 2018-12-28 DIAGNOSIS — D229 Melanocytic nevi, unspecified: Secondary | ICD-10-CM | POA: Diagnosis not present

## 2018-12-28 DIAGNOSIS — L821 Other seborrheic keratosis: Secondary | ICD-10-CM | POA: Diagnosis not present

## 2019-01-09 ENCOUNTER — Ambulatory Visit: Payer: Medicare Other | Admitting: Neurology

## 2019-01-09 ENCOUNTER — Other Ambulatory Visit: Payer: Self-pay | Admitting: Family Medicine

## 2019-01-09 ENCOUNTER — Ambulatory Visit (INDEPENDENT_AMBULATORY_CARE_PROVIDER_SITE_OTHER): Payer: Medicare Other | Admitting: Neurology

## 2019-01-09 ENCOUNTER — Other Ambulatory Visit: Payer: Self-pay

## 2019-01-09 DIAGNOSIS — G3281 Cerebellar ataxia in diseases classified elsewhere: Secondary | ICD-10-CM

## 2019-01-09 DIAGNOSIS — I639 Cerebral infarction, unspecified: Secondary | ICD-10-CM | POA: Diagnosis not present

## 2019-01-09 DIAGNOSIS — G6289 Other specified polyneuropathies: Secondary | ICD-10-CM

## 2019-01-09 DIAGNOSIS — R202 Paresthesia of skin: Secondary | ICD-10-CM

## 2019-01-09 DIAGNOSIS — R2681 Unsteadiness on feet: Secondary | ICD-10-CM

## 2019-01-09 NOTE — Procedures (Signed)
Full Name: [name] Gender: Female MRN #: [id number] Date of Birth: March 16, 1946    Visit Date: 01/09/2019 09:04 Age: 73 Years 2 Months Old Examining Physician: Marcial Pacas, MD  Referring Physician: Marcial Pacas, MD History:    73 year old female presented with bilateral feet paresthesia  Summary of the tests: Nerve conduction study: Bilateral sural sensory responses were absent.  Bilateral superficial peroneal sensory responses showed moderately decreased snap amplitude.  Left ulnar sensory response was normal  Bilateral peroneal to EDB were normal.  She had a history of bilateral knee replacement, bilateral tibial motor responses showed significant amplitude drop at proximal stimulation side, with mildly decreased conduction velocity.  Left ulnar motor responses were normal  Electromyography: Selective needle examinations were performed at bilateral lower extremity muscles and bilateral lumbosacral paraspinals.  There was no significant abnormality noted.  Conclusion: This is an abnormal study.  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  There is no evidence of bilateral lumbosacral radiculopathy.    ------------------------------- Marcial Pacas, M.D. PhD  [facility] [place], [place] [address] Tel: [phone] Fax: [phone]        [facility]    Nerve / Sites Muscle Latency Ref. Amplitude Ref. Rel Amp Segments Distance Velocity Ref. Area    ms ms mV mV %  cm m/s m/s mVms  L Ulnar - ADM     Wrist ADM 2.6 ?3.3 9.8 ?6.0 100 Wrist - ADM 7   27.2     B.Elbow ADM 6.0  9.4  95.9 B.Elbow - Wrist 18 52 ?49 26.5     A.Elbow ADM 8.0  9.0  95.6 A.Elbow - B.Elbow 10 51 ?49 26.3         A.Elbow - Wrist      R Peroneal - EDB     Ankle EDB 4.7 ?6.5 3.5 ?2.0 100 Ankle - EDB 9   11.0     Fib head EDB 10.7  3.4  96.4 Fib head - Ankle 29 48 ?44 10.7     Pop fossa EDB 13.0  3.2  94.4 Pop fossa - Fib head 10 45  ?44 10.3         Pop fossa - Ankle      L Peroneal - EDB     Ankle EDB 4.4 ?6.5 3.9 ?2.0 100 Ankle - EDB 9   13.0     Fib head EDB 11.7  3.1  78.5 Fib head - Ankle 29 40 ?44 11.9     Pop fossa EDB 14.2  2.9  94.6 Pop fossa - Fib head 10 40 ?44 11.2         Pop fossa - Ankle      R Tibial - AH     Ankle AH 4.5 ?5.8 7.9 ?4.0 100 Ankle - AH 9   16.5     Pop fossa AH 10.5  0.5  5.98 Pop fossa - Ankle 37 62 ?41 2.3  L Tibial - AH     Ankle AH 4.4 ?5.8 7.8 ?4.0 100 Ankle - AH 9   15.7     Pop fossa AH 14.3  1.2  15.4 Pop fossa - Ankle 37 37 ?41 10.1               SNC    Nerve / Sites Rec. Site Peak Lat Ref.  Amp Ref. Segments Distance    ms ms V V  cm  R  Sural - Ankle (Calf)     Calf Ankle NR ?4.4 NR ?6 Calf - Ankle 14  L Sural - Ankle (Calf)     Calf Ankle NR ?4.4 NR ?6 Calf - Ankle 14  R Superficial peroneal - Ankle     Lat leg Ankle 5.3 ?4.4 2 ?6 Lat leg - Ankle 14  L Superficial peroneal - Ankle     Lat leg Ankle 4.9 ?4.4 1 ?6 Lat leg - Ankle 14  L Ulnar - Orthodromic, (Dig V, Mid palm)     Dig V Wrist 2.8 ?3.1 5 ?5 Dig V - Wrist 71               F  Wave    Nerve F Lat Ref.   ms ms  R Tibial - AH 51.5 ?56.0  L Tibial - AH 62.2 ?56.0  L Ulnar - ADM 28.6 ?32.0           EMG       EMG Summary Table    Spontaneous MUAP Recruitment  Muscle IA Fib PSW Fasc Other Amp Dur. Poly Pattern  L. Tibialis anterior Normal None None None _______ Normal Normal Normal Normal  L. Tibialis posterior Normal None None None _______ Normal Normal Normal Normal  L. Peroneus longus Normal None None None _______ Normal Normal Normal Normal  L. Vastus lateralis Normal None None None _______ Normal Normal Normal Normal  L. Abductor hallucis Normal None None None _______ Normal Normal Normal Normal  R. Tibialis anterior Normal None None None _______ Normal Normal Normal Normal  R. Tibialis posterior Normal None None None _______ Normal Normal Normal Normal  R. Peroneus longus Normal None None None  _______ Normal Normal Normal Normal  R. Gastrocnemius (Medial head) Normal None None None _______ Normal Normal Normal Normal  R. Vastus lateralis Normal None None None _______ Normal Normal Normal Normal  R. Lumbar paraspinals (mid) Normal None None None _______ Normal Normal Normal Normal  R. Lumbar paraspinals (low) Normal None None None _______ Normal Normal Normal Normal  L. Lumbar paraspinals (mid) Normal None None None _______ Normal Normal Normal Normal  L. Lumbar paraspinals (low) Normal None None None _______ Normal Normal Normal Normal

## 2019-01-09 NOTE — Progress Notes (Signed)
PATIENT: [name] DOB: 1945/10/04  No chief complaint on file.    HISTORICAL  [name] is a 73 year old female, accompanied by her husband, seen in request by her primary care physician Dr. Warrick Parisian for evaluation of acute onset of unsteady gait, peripheral neuropathy, left hand tremor, initial evaluation was on November 03, 2018  I have reviewed and summarized the referring note from the referring physician.  She has past medical history of diabetes mellitus, hypertension, hyperlipidemia, depression anxiety on polypharmacy treatment, but not on antiplatelet agent.  She was diagnosed with Purtscher's retinopathy of both eye, was treated at the [facility], I reviewed ophthalmology evaluation by Dr.Weinstein in March 2020, she was treated with short course of steroid, symptoms has much improved, but has mild bilateral blurry vision  On October 31, 2018, while driving, she noticed sudden onset of dizziness, described as surrounding was moving, her body lean towards the right side, when she stepped out of the car, she noticed unsteady gait, leaning towards her right side, symptoms has been persistent since onset, with mild improvement, still has unsteady gait, walks like a drunk, she denies significant vertigo, no hearing loss no lateralized motor or sensory deficit  She also has symptoms of neuropathy since 2015, starting with bilateral plantar feet numbness, tingling, gradually getting worse, now to top of her foot, she denies significant gait abnormality, mild low back pain, no radiating pain to bilateral lower extremity, no incontinence, no upper extremity paresthesia.  She also noticed left hand intermittent tremor since 2020, most noticeable when holding pen, no resting tremor, no loss sense of smell.  She has difficulty sleeping, frequent awakening at nighttime, but did not notice REM sleep disorder  UPDATE Jan 09 2019: She continues to complains intermittent bilateral feet  paresthesia, no longer has dizziness  EMG nerve conduction study today showed mild axonal sensorimotor polyneuropathy.  There is no evidence of bilateral lumbar radiculopathy.  I have personally reviewed MRI of the brain without contrast November 03, 2018: There was no acute abnormality.  Moderate small vessel disease, progressed from 2007.  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  ALLERGIES: Allergies  Allergen Reactions  . Penicillins Other (See Comments)    Blisters in mouth Did it involve swelling of the face/tongue/throat, SOB, or low BP? No Did it involve sudden or severe rash/hives, skin peeling, or any reaction on the inside of your mouth or nose? No Did you need to seek medical attention at a hospital or doctor's office? No When did it last happen?Childhood allergy If all above answers are "NO", may proceed with cephalosporin use.   . Codeine Nausea And Vomiting  . Erythromycin Nausea And Vomiting  . Fenofibrate Other (See Comments)    aching & edema    . Lyrica [Pregabalin] Other (See Comments)    Edema in feet   . Statins Other (See Comments)    Joints ache  . Sulfa Antibiotics Nausea And Vomiting    HOME MEDICATIONS: Current Outpatient Medications  Medication Sig Dispense Refill  . ALPRAZolam (XANAX) 0.5 MG tablet Take 1-2 tabs daily as needed (Patient taking differently: Take 0.5 mg by mouth 2 (two) times daily as needed for anxiety. ) 135 tablet 1  .  ARIPiprazole (ABILIFY) 2 MG tablet Take 0.5 tablets (1 mg total) by mouth daily. 90 tablet 0  . aspirin EC 81 MG tablet Take 81 mg by mouth daily.    . blood glucose meter kit and supplies KIT Dispense based on patient and insurance preference. Use up to two times  daily as directed. (Dx: type 2 DM - E11.9) 1 each 0  . Blood Glucose Monitoring Suppl DEVI Blood glucose meter One Touch Brand. Use to check BS up to BID. DX 250.02 1 each 0  . Cholecalciferol (VITAMIN D) 50 MCG (2000 UT) tablet Take 2,000 Units by mouth daily.     Marland Kitchen escitalopram (LEXAPRO) 20 MG tablet TAKE 1 TABLET DAILY 90 tablet 0  . esomeprazole (NEXIUM) 40 MG capsule TAKE (1) CAPSULE TWICE DAILY. (Patient taking differently: Take 40 mg by mouth daily. ) 180 capsule 0  . ezetimibe (ZETIA) 10 MG tablet TAKE 1 TABLET DAILY 90 tablet 0  . gabapentin (NEURONTIN) 600 MG tablet TAKE  (1)  TABLET  FOUR TIMES DAILY. 360 tablet 0  . glucose blood (ONE TOUCH ULTRA TEST) test strip CHECK BLOOD SUGAR UP TO 2 TIMES A DAY 200 each 3  . glucose monitoring kit (FREESTYLE) monitoring kit Please dispense whichever glucometer patient's insurance will cover.  Use to check BG up to twice.  DX: 250.02 1 each 0  . HYDROcodone-acetaminophen (NORCO/VICODIN) 5-325 MG tablet Take 1 tablet by mouth every 6 (six) hours as needed for moderate pain.    Marland Kitchen lisinopril-hydrochlorothiazide (ZESTORETIC) 20-25 MG tablet TAKE 1 TABLET DAILY 90 tablet 0  . metFORMIN (GLUCOPHAGE-XR) 500 MG 24 hr tablet Take 2 tablets (1,000 mg total) by mouth daily with breakfast. 180 tablet 2  . Misc Natural Products (LUTEIN 20 PO) Take by mouth daily.    . Omega-3 Fatty Acids (FISH OIL) 1000 MG CAPS Take by mouth. Take 2 pills daily    . ONETOUCH DELICA LANCETS 18A MISC CHECK BLOOD SUGAR UP TO TWICE DAILY 200 each 3  . Pitavastatin Calcium (LIVALO) 2 MG TABS TAKE (1) TABLET EVERY OTHER DAY. 45 tablet 0  . PREMARIN vaginal cream Place 1 Applicatorful vaginally at bedtime as needed. 30 g 0  . Semaglutide, 1 MG/DOSE, (OZEMPIC, 1 MG/DOSE,) 2 MG/1.5ML SOPN Inject 1 mg into the skin once a week. 4 mL 2   No current facility-administered medications for this visit.     PAST MEDICAL HISTORY: Past Medical History:  Diagnosis Date  . Allergy     seasonal  . Arthritis    [place]   . Colon polyps   . Complication of anesthesia    per pt, she woke up in the middle of last colon in 2014.  . Depression   . Diabetes mellitus, type 2 ([place]) 06/2010  . Diabetic neuropathy ([place])   . Diverticulosis    pt unaware  . Esophageal stricture   . Fibromyalgia    [place]   . GERD (gastroesophageal reflux disease)   . Hemorrhoids   . Hiatal hernia   . Hyperlipidemia   . Hypertension   . Menopause   . Numbness   . Peripheral vascular insufficiency ([place])   . Retinopathy    Bilateral  . Tremor   . Vitamin D deficiency     PAST SURGICAL HISTORY: Past Surgical History:  Procedure Laterality Date  . ABDOMINAL HYSTERECTOMY     partial and then total  . ANKLE SURGERY Right   . COLONOSCOPY    . ELBOW SURGERY  left  . KNEE ARTHROSCOPY Right 09/12/2018   Procedure: ARTHROSCOPY KNEE; SYNOVECTOMY;  Surgeon: Gaynelle Arabian, MD;  Location: [facility];  Service: Orthopedics;  Laterality: Right;  57mn  . REFRACTIVE SURGERY  2003  . TOTAL KNEE ARTHROPLASTY     bilateral  . TOTAL KNEE REVISION Right 03/09/2018   Procedure: RIGHT TOTAL KNEE REVISION;  Surgeon: AGaynelle Arabian MD;  Location: [facility];  Service: Orthopedics;  Laterality: Right;  1274m with abductor block  . VESICOVAGINAL FISTULA CLOSURE W/ TAH  1981    FAMILY HISTORY: Family History  Problem Relation Age of Onset  . Diabetes Mother   . Heart failure Mother   . Hypertension Mother   . Cirrhosis Father   . Cirrhosis Brother     SOCIAL HISTORY: Social History   Socioeconomic History  . Marital status: Married    Spouse name: DaKasandra Knudsen. Number of children: 2  . Years of education: two years of college  . Highest education level: Some college, no degree  Occupational History  . Occupation: Retired  [employer] laboratory technician. Financial resource strain: Not hard at all  . Food insecurity    Worry: Never true    Inability: Never true  . Transportation needs    Medical: No     Non-medical: No  Tobacco Use  . Smoking status: Former Smoker    Packs/day: 1.00    Years: 10.00    Pack years: 10.00    Types: Cigarettes    Start date: 11/07/1975    Quit date: 06/24/2010    Years since quitting: 8.5  . Smokeless tobacco: Never Used  Substance and Sexual Activity  . Alcohol use: Not Currently  . Drug use: No  . Sexual activity: Yes    Birth control/protection: Surgical  Lifestyle  . Physical activity    Days per week: 0 days    Minutes per session: 0 min  . Stress: Not at all  Relationships  . Social connections    Talks on phone: More than three times a week    Gets together: More than three times a week    Attends religious service: More than 4 times per year    Active member of club or organization: Yes    Attends meetings of clubs or organizations: More than 4 times per year    Relationship status: Married  . Intimate partner violence    Fear of current or ex partner: No    Emotionally abused: No    Physically abused: No    Forced sexual activity: No  Other Topics Concern  . Not on file  Social History Narrative   Lives at home with her husband.   Left-handed.   One can of Diet Coke per day.     PHYSICAL EXAM   There were no vitals filed for this visit.  Not recorded      There is no height or weight on file to calculate BMI.  PHYSICAL EXAMNIATION:  Gen: NAD, conversant, well nourised, obese, well groomed                     Cardiovascular: Regular rate rhythm, no peripheral edema, warm, nontender. Eyes: Conjunctivae clear without exudates or hemorrhage Neck: Supple, no carotid bruits. Pulmonary: Clear to auscultation bilaterally   NEUROLOGICAL EXAM:  MENTAL STATUS: Speech:    Speech is normal; fluent and spontaneous with normal comprehension.  Cognition:     Orientation to time, place and person  Normal recent and remote memory     Normal Attention span and concentration     Normal Language, naming, repeating,spontaneous  speech     Fund of knowledge   CRANIAL NERVES: CN II: Visual fields are full to confrontation.  Pupils are round equal and briskly reactive to light. CN III, IV, VI: extraocular movement are normal. No ptosis.  Mild end gaze nystagmus most noticeable to right side CN V: Facial sensation is intact to pinprick in all 3 divisions bilaterally. Corneal responses are intact.  CN VII: Face is symmetric with normal eye closure and smile. CN VIII: Hearing is normal to rubbing fingers CN IX, X: Palate elevates symmetrically. Phonation is normal. CN XI: Head turning and shoulder shrug are intact CN XII: Tongue is midline with normal movements and no atrophy.  MOTOR: There is no pronator drift of out-stretched arms. Muscle bulk and tone are normal. Muscle strength is normal.  REFLEXES: Reflexes are 1 and symmetric at the biceps, triceps, absent at knees, and ankles. Plantar responses are flexor.  SENSORY: Length dependent decreased to light touch, pinprick to ankle level, preserved to toe vibratory sensation.  COORDINATION: she has mild right finger-to-nose dysmetria.  Truncal ataxia leaning towards the right side  GAIT/STANCE: She cannot get up from seated position arms crossed, wide-based, unsteady, could not perform tandem walking   DIAGNOSTIC DATA (LABS, IMAGING, TESTING) - I reviewed patient records, labs, notes, testing and imaging myself where available.   ASSESSMENT AND PLAN  NECIE WILCOXSON is a 73 y.o. female   Acute onset of unsteady gait ataxia on October 31, 2018, has much improved  MRI of the brain showed no acute abnormality, small vessel disease, MRA of the brain showed severe right PCA stenosis, aspirin 81 mg daily  Peripheral neuropathy  Most consistent with her long history of diabetes  EMG nerve conduction study confirmed mild length dependent axonal sensorimotor polyneuropathy,    Marcial Pacas, M.D. Ph.D.  [facility] [address] [place], [place] [address] Ph: [phone] Fax: [phone]  CC: Dettinger, Fransisca Kaufmann, MD

## 2019-01-10 MED ORDER — ONETOUCH VERIO W/DEVICE KIT: PACK | 0 refills | Status: DC

## 2019-01-10 NOTE — Telephone Encounter (Signed)
One Touch meter sent to [facility]

## 2019-01-23 ENCOUNTER — Other Ambulatory Visit: Payer: Self-pay | Admitting: Family Medicine

## 2019-02-15 DIAGNOSIS — M48062 Spinal stenosis, lumbar region with neurogenic claudication: Secondary | ICD-10-CM | POA: Diagnosis not present

## 2019-02-15 DIAGNOSIS — M5136 Other intervertebral disc degeneration, lumbar region: Secondary | ICD-10-CM | POA: Diagnosis not present

## 2019-02-15 DIAGNOSIS — Z79891 Long term (current) use of opiate analgesic: Secondary | ICD-10-CM | POA: Diagnosis not present

## 2019-02-20 ENCOUNTER — Other Ambulatory Visit: Payer: Self-pay | Admitting: Family Medicine

## 2019-03-08 ENCOUNTER — Ambulatory Visit: Payer: Medicare Other | Admitting: Family Medicine

## 2019-03-09 DIAGNOSIS — H04123 Dry eye syndrome of bilateral lacrimal glands: Secondary | ICD-10-CM | POA: Diagnosis not present

## 2019-03-18 ENCOUNTER — Other Ambulatory Visit: Payer: Self-pay | Admitting: Family Medicine

## 2019-04-11 ENCOUNTER — Telehealth: Payer: Self-pay | Admitting: *Deleted

## 2019-04-11 NOTE — Telephone Encounter (Signed)
I spoke with [name], re: her follow up appointment she stated she didn't need to return she came in for surgical clearance. Recall removed.

## 2019-04-12 ENCOUNTER — Ambulatory Visit (INDEPENDENT_AMBULATORY_CARE_PROVIDER_SITE_OTHER): Payer: Medicare Other | Admitting: Family Medicine

## 2019-04-12 ENCOUNTER — Encounter: Payer: Self-pay | Admitting: Family Medicine

## 2019-04-12 DIAGNOSIS — F320 Major depressive disorder, single episode, mild: Secondary | ICD-10-CM

## 2019-04-12 MED ORDER — GABAPENTIN 600 MG PO TABS: 600.0000 mg | ORAL_TABLET | Freq: Four times a day (QID) | ORAL | 3 refills | Status: DC

## 2019-04-12 MED ORDER — ESCITALOPRAM OXALATE 20 MG PO TABS: 20.0000 mg | ORAL_TABLET | Freq: Every day | ORAL | 3 refills | Status: DC

## 2019-04-12 MED ORDER — OZEMPIC (1 MG/DOSE) 2 MG/1.5ML ~~LOC~~ SOPN: 1.0000 mg | PEN_INJECTOR | SUBCUTANEOUS | 3 refills | Status: DC

## 2019-04-12 MED ORDER — EZETIMIBE 10 MG PO TABS: 10.0000 mg | ORAL_TABLET | Freq: Every day | ORAL | 3 refills | Status: DC

## 2019-04-12 MED ORDER — LISINOPRIL-HYDROCHLOROTHIAZIDE 20-25 MG PO TABS: 1.0000 | ORAL_TABLET | Freq: Every day | ORAL | 3 refills | Status: DC

## 2019-04-12 MED ORDER — ARIPIPRAZOLE 2 MG PO TABS: 2.0000 mg | ORAL_TABLET | Freq: Every day | ORAL | 3 refills | Status: DC

## 2019-04-12 MED ORDER — LIVALO 2 MG PO TABS: 2.0000 mg | ORAL_TABLET | ORAL | 3 refills | Status: DC

## 2019-04-12 MED ORDER — ALPRAZOLAM 0.5 MG PO TABS: 0.5000 mg | ORAL_TABLET | Freq: Two times a day (BID) | ORAL | 2 refills | Status: DC | PRN

## 2019-04-12 MED ORDER — METFORMIN HCL ER 500 MG PO TB24: 1000.0000 mg | ORAL_TABLET | Freq: Every day | ORAL | 3 refills | Status: DC

## 2019-04-12 MED ORDER — ESOMEPRAZOLE MAGNESIUM 40 MG PO CPDR: 40.0000 mg | DELAYED_RELEASE_CAPSULE | Freq: Every day | ORAL | 3 refills | Status: DC

## 2019-04-12 NOTE — Progress Notes (Signed)
Virtual Visit via telephone Note  I connected with [name] on 04/12/19 at [place] by telephone and verified that I am speaking with the correct person using two identifiers. [name] is currently located at home and no other people are currently with her during visit. The provider, Fransisca Kaufmann [place] Fo[place], MD is located in their office at time of visit.  Call ended at 1005  I discussed the limitations, risks, security and privacy concerns of performing an evaluation and management service by telephone and the availability of in person appointments. I also discussed with the patient that there may be a patient responsible charge related to this service. The patient expressed understanding and agreed to proceed.   History and Present Illness: Depression and anxiety Patient is currently taking xanax and abilify and lexapro.  She has been on all 3 of these medications for many years, 8-10 years. She did have a lot of anxiety over the holidays because of covid. She has beenn having more panic attacks during the holidays. 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   Current rx-alprazolam 0.5 mg twice daily as needed, did a lot of encouragement for her to reduce using it at night and will focus a lot more on that only using it during panic attacks and she will increase her Abilify to taking a whole tablet of the Abilify in the evening and next time we will taper down when we have her in person. # meds rx-alprazolam 0.5 mg twice daily as needed Effectiveness of current meds-working well and she tries to use it as minimal as she can Adverse reactions form meds-none  Pill count performed-No Last drug screen -N/A, not in person today, will have For contract and drug screen ( high risk q48m moderate risk q620mlow risk yearly ) Urine drug screen today- No [place]yes  If yes were their any concerning findings? -She is on hydrocodone as well  but at a low dose and based on the 2 mixed doses of the alprazolam and the hydrocodone it does put her at a lower overdose risk of 1% for now, will work on tapering slowly  No flowsheet data found.   Controlled substance contract signed on: N/A [facility] 12/14/2018 11/03/2018 06/13/2018 05/17/2018 12/31/2017  Decreased Interest 0 3 0 0 0  Down, Depressed, Hopeless 0 0 1 1 0  PHQ - 2 Score 0 3 1 1  0  Altered sleeping - 3 - - -  Tired, decreased energy - 3 - - -  Change in appetite - 0 - - -  Feeling bad or failure about yourself  - 0 - - -  Trouble concentrating - 0 - - -  Moving slowly or fidgety/restless - 0 - - -  Suicidal thoughts - 0 - - -  PHQ-9 Score - 9 - - -  Difficult doing work/chores - - - - -  Some recent data might be hidden     1. Current mild episode of major depressive disorder, unspecified whether recurrent ([facility]    Outpatient Encounter Medications as of 04/12/2019  Medication Sig  . ALPRAZolam (XANAX) 0.5 MG tablet Take 1 tablet (0.5 mg total) by mouth 2 (two) times daily as needed for anxiety. Take 1-2 tabs daily as needed  . ARIPiprazole (ABILIFY) 2 MG tablet Take 1 tablet (2 mg total) by mouth daily.  . Marland Kitchenspirin EC 81 MG  tablet Take 81 mg by mouth daily.  . blood glucose meter kit and supplies KIT Dispense based on patient and insurance preference. Use up to two times daily as directed. (Dx: type 2 DM - E11.9)  . Blood Glucose Monitoring Suppl (ONETOUCH VERIO) w/Device KIT Test BS BID Dx E11.9  . Cholecalciferol (VITAMIN D) 50 MCG (2000 UT) tablet Take 2,000 Units by mouth daily.   Marland Kitchen escitalopram (LEXAPRO) 20 MG tablet Take 1 tablet (20 mg total) by mouth daily.  Marland Kitchen esomeprazole (NEXIUM) 40 MG capsule Take 1 capsule (40 mg total) by mouth daily.  Marland Kitchen ezetimibe (ZETIA) 10 MG tablet Take 1 tablet (10 mg total) by mouth daily.  Marland Kitchen gabapentin (NEURONTIN) 600 MG tablet Take 1 tablet (600 mg total) by mouth 4 (four) times daily.  Marland Kitchen glucose blood (ONE TOUCH ULTRA  TEST) test strip CHECK BLOOD SUGAR UP TO 2 TIMES A DAY  . HYDROcodone-acetaminophen (NORCO/VICODIN) 5-325 MG tablet Take 1 tablet by mouth every 6 (six) hours as needed for moderate pain.  Marland Kitchen lisinopril-hydrochlorothiazide (ZESTORETIC) 20-25 MG tablet Take 1 tablet by mouth daily.  . metFORMIN (GLUCOPHAGE-XR) 500 MG 24 hr tablet Take 2 tablets (1,000 mg total) by mouth daily with breakfast.  . Misc Natural Products (LUTEIN 20 PO) Take by mouth daily.  . Omega-3 Fatty Acids (FISH OIL) 1000 MG CAPS Take by mouth. Take 2 pills daily  . ONETOUCH DELICA LANCETS 45W MISC CHECK BLOOD SUGAR UP TO TWICE DAILY  . Pitavastatin Calcium (LIVALO) 2 MG TABS Take 1 tablet (2 mg total) by mouth every other day.  Marland Kitchen PREMARIN vaginal cream Place 1 Applicatorful vaginally at bedtime as needed.  . Semaglutide, 1 MG/DOSE, (OZEMPIC, 1 MG/DOSE,) 2 MG/1.5ML SOPN Inject 1 mg into the skin once a week.  . [DISCONTINUED] ALPRAZolam (XANAX) 0.5 MG tablet Take 1-2 tabs daily as needed (Patient taking differently: Take 0.5 mg by mouth 2 (two) times daily as needed for anxiety. )  . [DISCONTINUED] ARIPiprazole (ABILIFY) 2 MG tablet TAKE 1 TABLET DAILY  . [DISCONTINUED] escitalopram (LEXAPRO) 20 MG tablet TAKE 1 TABLET DAILY  . [DISCONTINUED] esomeprazole (NEXIUM) 40 MG capsule TAKE (1) CAPSULE TWICE DAILY. (Patient taking differently: Take 40 mg by mouth daily. )  . [DISCONTINUED] ezetimibe (ZETIA) 10 MG tablet TAKE 1 TABLET DAILY  . [DISCONTINUED] gabapentin (NEURONTIN) 600 MG tablet TAKE  (1)  TABLET  FOUR TIMES DAILY.  . [DISCONTINUED] lisinopril-hydrochlorothiazide (ZESTORETIC) 20-25 MG tablet TAKE 1 TABLET DAILY  . [DISCONTINUED] LIVALO 2 MG TABS TAKE (1) TABLET EVERY OTHER DAY.  . [DISCONTINUED] metFORMIN (GLUCOPHAGE-XR) 500 MG 24 hr tablet Take 2 tablets (1,000 mg total) by mouth daily with breakfast.  . [DISCONTINUED] OZEMPIC, 1 MG/DOSE, 2 MG/1.5ML SOPN Inject 1 mg into the skin once a week.   No facility-administered  encounter medications on file as of 04/12/2019.    Review of Systems  Constitutional: Negative for chills and fever.  Eyes: Negative for redness and visual disturbance.  Respiratory: Negative for chest tightness and shortness of breath.   Cardiovascular: Negative for chest pain and leg swelling.  Musculoskeletal: Negative for back pain and gait problem.  Skin: Negative for rash.  Neurological: Negative for light-headedness and headaches.  Psychiatric/Behavioral: Positive for dysphoric mood and sleep disturbance. Negative for agitation, behavioral problems, self-injury and suicidal ideas. The patient is nervous/anxious.   All other systems reviewed and are negative.   Observations/Objective: Patient sounds comfortable and in no acute distress  Assessment and Plan: Problem List Items Addressed  This Visit      Other   Depression - Primary   Relevant Medications   ARIPiprazole (ABILIFY) 2 MG tablet   escitalopram (LEXAPRO) 20 MG tablet   ALPRAZolam (XANAX) 0.5 MG tablet      Patient is showing to try and increase the Abilify to the whole tablet at nighttime and try and reduce her evening dose of Xanax and only use it during the day as needed, switched from 90-day prescriptions to only 30-day prescriptions and will have her back in 3 months. Follow up plan: Return in about 3 months (around 07/11/2019), or if symptoms worsen or fail to improve, for Depression and diabetes and hypertension.     I discussed the assessment and treatment plan with the patient. The patient was provided an opportunity to ask questions and all were answered. The patient agreed with the plan and demonstrated an understanding of the instructions.   The patient was advised to call back or seek an in-person evaluation if the symptoms worsen or if the condition fails to improve as anticipated.  The above assessment and management plan was discussed with the patient. The patient verbalized understanding of and has  agreed to the management plan. Patient is aware to call the clinic if symptoms persist or worsen. Patient is aware when to return to the clinic for a follow-up visit. Patient educated on when it is appropriate to go to the emergency department.    I provided 20 minutes of non-face-to-face time during this encounter.    Worthy Rancher, MD

## 2019-04-21 ENCOUNTER — Telehealth: Payer: Self-pay | Admitting: Family Medicine

## 2019-04-21 NOTE — Telephone Encounter (Signed)
Aware.Can get shot for covid.

## 2019-04-26 ENCOUNTER — Ambulatory Visit: Payer: Medicare Other | Attending: Internal Medicine

## 2019-04-26 DIAGNOSIS — Z23 Encounter for immunization: Secondary | ICD-10-CM

## 2019-04-26 NOTE — Progress Notes (Signed)
   Covid-19 Vaccination Clinic  Name:  [name]    MRN: [id number] DOB: [birth year]  04/26/2019  [name] was observed post Covid-19 immunization for 15 minutes without incidence. She was provided with Vaccine Information Sheet and instruction to access the V-Safe system.   [name] was instructed to call 911 with any severe reactions post vaccine: Marland Kitchen Difficulty breathing  . Swelling of your face and throat  . A fast heartbeat  . A bad rash all over your body  . Dizziness and weakness    Immunizations Administered    Name Date Dose VIS Date Route   Pfizer COVID-19 Vaccine 04/26/2019 11:36 AM 0.3 mL 03/17/2019 Intramuscular   Manufacturer: [place]   Lot: [id number]   [place]: [id number]

## 2019-05-03 ENCOUNTER — Other Ambulatory Visit: Payer: Self-pay

## 2019-05-03 ENCOUNTER — Emergency Department (HOSPITAL_COMMUNITY): Payer: Medicare Other

## 2019-05-03 ENCOUNTER — Observation Stay (HOSPITAL_COMMUNITY)
Admission: EM | Admit: 2019-05-03 | Discharge: 2019-05-04 | Disposition: A | Discharge status: Home / Self Care | Payer: Medicare Other | Attending: Student | Admitting: Student

## 2019-05-03 ENCOUNTER — Encounter (HOSPITAL_COMMUNITY): Payer: Self-pay | Admitting: Emergency Medicine

## 2019-05-03 ENCOUNTER — Telehealth: Payer: Self-pay | Admitting: Family Medicine

## 2019-05-03 DIAGNOSIS — R072 Precordial pain: Secondary | ICD-10-CM | POA: Diagnosis not present

## 2019-05-03 DIAGNOSIS — I110 Hypertensive heart disease with heart failure: Secondary | ICD-10-CM | POA: Insufficient documentation

## 2019-05-03 DIAGNOSIS — F32A Depression, unspecified: Secondary | ICD-10-CM | POA: Diagnosis present

## 2019-05-03 DIAGNOSIS — E1159 Type 2 diabetes mellitus with other circulatory complications: Secondary | ICD-10-CM

## 2019-05-03 DIAGNOSIS — E669 Obesity, unspecified: Secondary | ICD-10-CM | POA: Insufficient documentation

## 2019-05-03 DIAGNOSIS — Z6832 Body mass index (BMI) 32.0-32.9, adult: Secondary | ICD-10-CM | POA: Diagnosis not present

## 2019-05-03 DIAGNOSIS — Z881 Allergy status to other antibiotic agents status: Secondary | ICD-10-CM | POA: Diagnosis not present

## 2019-05-03 DIAGNOSIS — F320 Major depressive disorder, single episode, mild: Secondary | ICD-10-CM

## 2019-05-03 DIAGNOSIS — Z20822 Contact with and (suspected) exposure to covid-19: Secondary | ICD-10-CM | POA: Diagnosis not present

## 2019-05-03 DIAGNOSIS — E119 Type 2 diabetes mellitus without complications: Secondary | ICD-10-CM

## 2019-05-03 DIAGNOSIS — I10 Essential (primary) hypertension: Secondary | ICD-10-CM

## 2019-05-03 DIAGNOSIS — Z7984 Long term (current) use of oral hypoglycemic drugs: Secondary | ICD-10-CM | POA: Diagnosis not present

## 2019-05-03 DIAGNOSIS — F329 Major depressive disorder, single episode, unspecified: Secondary | ICD-10-CM | POA: Diagnosis present

## 2019-05-03 DIAGNOSIS — E785 Hyperlipidemia, unspecified: Secondary | ICD-10-CM | POA: Insufficient documentation

## 2019-05-03 DIAGNOSIS — Z833 Family history of diabetes mellitus: Secondary | ICD-10-CM | POA: Diagnosis not present

## 2019-05-03 DIAGNOSIS — K219 Gastro-esophageal reflux disease without esophagitis: Secondary | ICD-10-CM

## 2019-05-03 DIAGNOSIS — J984 Other disorders of lung: Secondary | ICD-10-CM | POA: Diagnosis not present

## 2019-05-03 DIAGNOSIS — Z7982 Long term (current) use of aspirin: Secondary | ICD-10-CM | POA: Insufficient documentation

## 2019-05-03 DIAGNOSIS — Z87891 Personal history of nicotine dependence: Secondary | ICD-10-CM | POA: Diagnosis not present

## 2019-05-03 DIAGNOSIS — I6621 Occlusion and stenosis of right posterior cerebral artery: Secondary | ICD-10-CM | POA: Diagnosis not present

## 2019-05-03 DIAGNOSIS — E1142 Type 2 diabetes mellitus with diabetic polyneuropathy: Secondary | ICD-10-CM | POA: Insufficient documentation

## 2019-05-03 DIAGNOSIS — G459 Transient cerebral ischemic attack, unspecified: Secondary | ICD-10-CM

## 2019-05-03 DIAGNOSIS — Z79899 Other long term (current) drug therapy: Secondary | ICD-10-CM | POA: Insufficient documentation

## 2019-05-03 DIAGNOSIS — Z8249 Family history of ischemic heart disease and other diseases of the circulatory system: Secondary | ICD-10-CM | POA: Diagnosis not present

## 2019-05-03 DIAGNOSIS — I5032 Chronic diastolic (congestive) heart failure: Secondary | ICD-10-CM | POA: Insufficient documentation

## 2019-05-03 DIAGNOSIS — Z885 Allergy status to narcotic agent status: Secondary | ICD-10-CM | POA: Insufficient documentation

## 2019-05-03 DIAGNOSIS — Z880 Allergy status to penicillin: Secondary | ICD-10-CM | POA: Diagnosis not present

## 2019-05-03 DIAGNOSIS — R202 Paresthesia of skin: Principal | ICD-10-CM | POA: Insufficient documentation

## 2019-05-03 DIAGNOSIS — Z888 Allergy status to other drugs, medicaments and biological substances status: Secondary | ICD-10-CM | POA: Insufficient documentation

## 2019-05-03 DIAGNOSIS — R0789 Other chest pain: Secondary | ICD-10-CM | POA: Diagnosis present

## 2019-05-03 LAB — BASIC METABOLIC PANEL
Anion gap: 10 (ref 5–15)
BUN: 11 mg/dL (ref 8–23)
CO2: 27 mmol/L (ref 22–32)
Calcium: 9.2 mg/dL (ref 8.9–10.3)
Chloride: 98 mmol/L (ref 98–111)
Creatinine, Ser: 0.69 mg/dL (ref 0.44–1.00)
GFR calc Af Amer: >60 mL/min (ref >60)
GFR calc non Af Amer: >60 mL/min (ref >60)
Glucose, Bld: 136 mg/dL — ABNORMAL HIGH (ref 70–99)
Potassium: 3.8 mmol/L (ref 3.5–5.1)
Sodium: 135 mmol/L (ref 135–145)

## 2019-05-03 LAB — CBC
HCT: 41.7 % (ref 36.0–46.0)
Hemoglobin: 13 g/dL (ref 12.0–15.0)
MCH: 27.3 pg (ref 26.0–34.0)
MCHC: 31.2 g/dL (ref 30.0–36.0)
MCV: 87.6 fL (ref 80.0–100.0)
Platelets: 240 10*3/uL (ref 150–400)
RBC: 4.76 MIL/uL (ref 3.87–5.11)
RDW: 14.4 % (ref 11.5–15.5)
WBC: 8.3 10*3/uL (ref 4.0–10.5)
nRBC: 0 % (ref 0.0–0.2)

## 2019-05-03 LAB — MAGNESIUM: Magnesium: 2.1 mg/dL (ref 1.7–2.4)

## 2019-05-03 LAB — TROPONIN I (HIGH SENSITIVITY)
Troponin I (High Sensitivity): <2 ng/L (ref <18)
Troponin I (High Sensitivity): <2 ng/L (ref <18)

## 2019-05-03 MED ORDER — ASPIRIN 81 MG PO CHEW
162.0000 mg | CHEWABLE_TABLET | Freq: Once | ORAL | Status: AC
  Administered 2019-05-03: 162 mg via ORAL
  Filled 2019-05-03: qty 2

## 2019-05-03 MED ORDER — ONDANSETRON HCL 4 MG PO TABS: 4.0000 mg | ORAL_TABLET | Freq: Four times a day (QID) | ORAL | Status: DC | PRN

## 2019-05-03 MED ORDER — ARIPIPRAZOLE 2 MG PO TABS
2.0000 mg | ORAL_TABLET | Freq: Every day | ORAL | Status: DC
  Administered 2019-05-04: 2 mg via ORAL
  Filled 2019-05-03 (×2): qty 1

## 2019-05-03 MED ORDER — PANTOPRAZOLE SODIUM 40 MG PO TBEC
80.0000 mg | DELAYED_RELEASE_TABLET | Freq: Every day | ORAL | Status: DC
  Administered 2019-05-04: 80 mg via ORAL
  Filled 2019-05-03: qty 2

## 2019-05-03 MED ORDER — PRAVASTATIN SODIUM 40 MG PO TABS
40.0000 mg | ORAL_TABLET | ORAL | Status: DC
  Filled 2019-05-03: qty 1

## 2019-05-03 MED ORDER — INSULIN ASPART 100 UNIT/ML ~~LOC~~ SOLN: 0.0000 [IU] | Freq: Three times a day (TID) | SUBCUTANEOUS | Status: DC

## 2019-05-03 MED ORDER — STROKE: EARLY STAGES OF RECOVERY BOOK
Freq: Once | Status: DC
  Filled 2019-05-03: qty 1

## 2019-05-03 MED ORDER — NITROGLYCERIN 0.4 MG SL SUBL: 0.4000 mg | SUBLINGUAL_TABLET | SUBLINGUAL | Status: DC | PRN

## 2019-05-03 MED ORDER — GABAPENTIN 300 MG PO CAPS
600.0000 mg | ORAL_CAPSULE | Freq: Four times a day (QID) | ORAL | Status: DC
  Administered 2019-05-03 – 2019-05-04 (×3): 600 mg via ORAL
  Filled 2019-05-03 (×3): qty 2

## 2019-05-03 MED ORDER — ESCITALOPRAM OXALATE 10 MG PO TABS
20.0000 mg | ORAL_TABLET | Freq: Every day | ORAL | Status: DC
  Administered 2019-05-04: 20 mg via ORAL
  Filled 2019-05-03: qty 2

## 2019-05-03 MED ORDER — CLOPIDOGREL BISULFATE 75 MG PO TABS
75.0000 mg | ORAL_TABLET | Freq: Every day | ORAL | Status: DC
  Administered 2019-05-04: 75 mg via ORAL
  Filled 2019-05-03: qty 1

## 2019-05-03 MED ORDER — ALUM & MAG HYDROXIDE-SIMETH 200-200-20 MG/5ML PO SUSP
30.0000 mL | Freq: Once | ORAL | Status: AC
  Administered 2019-05-03: 30 mL via ORAL
  Filled 2019-05-03: qty 30

## 2019-05-03 MED ORDER — ACETAMINOPHEN 650 MG RE SUPP: 650.0000 mg | Freq: Four times a day (QID) | RECTAL | Status: DC | PRN

## 2019-05-03 MED ORDER — SODIUM CHLORIDE 0.9% FLUSH
3.0000 mL | Freq: Two times a day (BID) | INTRAVENOUS | Status: DC
  Administered 2019-05-04: 3 mL via INTRAVENOUS

## 2019-05-03 MED ORDER — ACETAMINOPHEN 325 MG PO TABS: 650.0000 mg | ORAL_TABLET | Freq: Four times a day (QID) | ORAL | Status: DC | PRN

## 2019-05-03 MED ORDER — EZETIMIBE 10 MG PO TABS
10.0000 mg | ORAL_TABLET | Freq: Every day | ORAL | Status: DC
  Administered 2019-05-04: 10 mg via ORAL
  Filled 2019-05-03 (×3): qty 1

## 2019-05-03 MED ORDER — LIDOCAINE VISCOUS HCL 2 % MT SOLN
15.0000 mL | Freq: Once | OROMUCOSAL | Status: AC
  Administered 2019-05-03: 15 mL via ORAL
  Filled 2019-05-03: qty 15

## 2019-05-03 MED ORDER — ONDANSETRON HCL 4 MG/2ML IJ SOLN: 4.0000 mg | Freq: Four times a day (QID) | INTRAMUSCULAR | Status: DC | PRN

## 2019-05-03 MED ORDER — ASPIRIN EC 81 MG PO TBEC
81.0000 mg | DELAYED_RELEASE_TABLET | Freq: Every day | ORAL | Status: DC
  Administered 2019-05-04: 81 mg via ORAL
  Filled 2019-05-03: qty 1

## 2019-05-03 NOTE — Telephone Encounter (Signed)
Lmtcb.

## 2019-05-03 NOTE — ED Triage Notes (Signed)
Pt states having left sided numbness with weakness in arms and legs on Monday. Had the exact same episode of numbness last night at 1900. Talked to PCP today, was sent for eval.  Denies any numbness but has dizziness, chest pressure and arm pain

## 2019-05-03 NOTE — Telephone Encounter (Signed)
Yes I agree wholeheartedly that she should go to the emergency department, I do not know if it sounds like a stroke or if it sounds like she is having some kind of heart attack or heart problems but I would definitely go to the emergency department for evaluation.

## 2019-05-03 NOTE — H&P (Signed)
History and Physical    PLEASE NOTE THAT DRAGON DICTATION SOFTWARE WAS USED IN THE CONSTRUCTION OF THIS NOTE.   [name] TDD:[id number] DOB: 1945/07/06 DOA: 05/03/2019  PCP: Dettinger, Fransisca Kaufmann, MD Patient coming from: home   I have personally briefly reviewed patient's old medical records in [place]  Chief Complaint: Chest pain  HPI: [name] is a 74 y.o. 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   The patient reports that she developed sudden onset of numbness involving the left portion of her face, left upper extremity, and left lower extremity around 10 AM on Monday, January 25.  She reports that this episode was self-limited, completely resolving within 10 minutes of onset, and denies any associated acute focal weakness, dysphagia, dizziness, vertigo, change in vision, diplopia, word finding difficulties, headache, facial droop, or slurring of speech.   Subsequently, she remained completely asymptomatic until approximately 1800 on 05/02/2019, at which time she experienced sudden onset paresthesias limited to the left portion of her face in a V2/V3 distribution.  She reports that this second episode was not associated with any change in sensation involving the left upper extremity or left lower extremity.  Once again, the second episode was self-limited, completely resolving within 10 minutes of onset and absence of any additional acute focal neurologic deficits.  Subsequent to complete resolution of the second episode of diminished sensation,, the patient reports absolutely no ensuing acute focal neurologic  deficits.   She denies any previous history of stroke.  In terms of modifiable risk factors for acute ischemic CVA, the patient acknowledges a history of hypertension, hyperlipidemia, and type 2 diabetes mellitus.  She reports that she is a former smoker, having completely quit smoking approximately 5 years ago after smoking 1 pack/day for 10 to 15 years.  She denies any known history of atrial fibrillation nor any known history of obstructive sleep apnea.  She reports compliance with her outpatient Metformin, GLP-1 agonist, and hypertensive medications.  She is on a daily baby aspirin, but otherwise denies taking any blood thinning agents at home.  In the setting of a history of hyperlipidemia, the patient reports good compliance with home Zetia and Pitavastatin.  Then, around 11 AM this morning (05/03/19) the patient reports development of sharp left-sided chest discomfort radiating into the back that started while sitting at home.  This pain has been nonexertional, nonpleuritic, nonpositional, and nonreproducible with direct palpation of the anterior chest wall.  She reports that she has experienced approximately 10 episodes of this type of left-sided chest discomfort since onset this morning, with each episode self-limited, lasting less than 10 minutes before resolution in the absence of taking any nitroglycerin.  She reports that a few of these episodes of been associated with mild nausea in the absence of any vomiting.  She denies any associated shortness of breath, palpitations, diaphoresis, dizziness, presyncope, or syncope.  Denies any recent trauma.  She denies any escalation of intensity of discomfort associated with her episodes of chest discomfort throughout today, and denies any gradual increase in the duration of these episodes.  She acknowledges a history of GERD, and reports that these episodes of chest discomfort are very similar to episodes of  discomfort that she has previously associated with  GERD, both qualitatively as well as the location/radiation of the discomfort.  However, during the afternoon of 05/03/2019, she began to experience some left arm "soreness", which was new for her and prompted her to contact her PCP, who subsequently instructed her to present to the emergency department for further evaluation.   Denies any associated subjective fever, chills, rigors, or generalized myalgias. Denies any recent headache, neck stiffness, rhinitis, rhinorrhea, sore throat, sob, cough, abdominal pain, diarrhea, or rash. No recent traveling or known COVID-19 exposures.  Denies any associated hemoptysis, calf tenderness, peripheral edema, or leg erythema.   The patient denies any family history of premature coronary artery disease.  Denies any previous cardiac ischemic evaluation, including no prior stress test nor any history of coronary angiography.  In August 2020, she underwent echocardiogram with bubble study for evaluation of reported TIA, which demonstrated hyperdynamic left ventricle with LVEF greater than 65%; left ventricular cavity size was normal, and mild LVH was noted; there was also evidence of impaired relaxation, but no evidence of focal wall motion normalities.    ED Course:  Vital signs in the ED were notable for the following: Dementia max 98.3; heart rate 67-60; blood pressure ranged from 107/50 9-1 18/61; respiratory rate 17, and oxygen saturation 96 to 98% on room air.  Labs were notable for the following: BMP notable for sodium 135, potassium 3.8, bicarbonate 27, creatinine 0.7.  High-sensitivity troponin I x2 were both found to be less than 2.0.  CBC notable for white blood cell count of 8300, hemoglobin 13, and platelets 240.  Nasopharyngeal COVID-19 PCR was performed, with result currently pending.  EKG, in comparison to most recent prior EKG on 02/25/2018 showed normal sinus rhythm with heart rate 75, normal axis, T wave flattening in V1, T wave inversion in V2, and  potential Q waves in V1/V2, all of which were completely unchanged relative to most recent prior EKG; no evidence of ST changes, including no evidence of ST elevation.  2 view chest x-ray, per final radiology report, showed no evidence of acute cardiopulmonary process, including no evidence of infiltrate, edema, effusion, or pneumothorax.  Noncontrast CT the head, per final radiology report, showed stable mild atrophy and chronic microvascular ischemic changes, but no evidence of acute intracranial process, including no evidence of acute ischemic infarct or cranial hemorrhage.  Dr. Mike Craze of telestroke neurology was consulted, and felt that presentation was consistent with a TIA.  He recommended admission to the hospitalist service for further TIA evaluation and management including echo with bubble study, MRI of the brain, MRA of the head and neck, lipid panel, and hemoglobin A1c.  Additionally, he recommended initiation of dual anti-platelet therapy via the addition of Plavix 75 mg p.o. daily to existing outpatient daily baby aspirin.    While in the ED, the patient received aspirin 162 mg p.o. x1.  Additionally, she received GI cocktail containing Maalox/Mylanta/viscous lidocaine x 1 following which she reported resolution of her her chest discomfort.  Subsequently, the patient was admitted for overnight observation to the med telemetry floor at [facility] for further evaluation and management of presenting suspected TIA as well as work-up and management of presenting atypical chest discomfort.    Review of Systems: As per HPI otherwise 10 point review of systems negative.   Past Medical History:  Diagnosis Date  . Allergy    seasonal  . Arthritis    [place]   . Colon polyps   .  Complication of anesthesia    per pt, she woke up in the middle of last colon in 2014.  . Depression   . Diabetes mellitus, type 2 ([place]) 06/2010  . Diabetic neuropathy ([place])   . Diverticulosis    pt  [place]  . Esophageal stricture   . Fibromyalgia    [place]   . GERD (gastroesophageal reflux disease)   . Hemorrhoids   . Hiatal hernia   . Hyperlipidemia   . Hypertension   . Menopause   . Numbness   . Peripheral vascular insufficiency ([place])   . Retinopathy    Bilateral  . Tremor   . Vitamin D deficiency     Past Surgical History:  Procedure Laterality Date  . ABDOMINAL HYSTERECTOMY     partial and then total  . ANKLE SURGERY Right   . COLONOSCOPY    . ELBOW SURGERY     left  . KNEE ARTHROSCOPY Right 09/12/2018   Procedure: ARTHROSCOPY KNEE; SYNOVECTOMY;  Surgeon: Gaynelle Arabian, MD;  Location: [facility] ORS;  Service: Orthopedics;  Laterality: Right;  32mn  . REFRACTIVE SURGERY  2003  . TOTAL KNEE ARTHROPLASTY     bilateral  . TOTAL KNEE REVISION Right 03/09/2018   Procedure: RIGHT TOTAL KNEE REVISION;  Surgeon: AGaynelle Arabian MD;  Location: [facility] ORS;  Service: Orthopedics;  Laterality: Right;  1257m with abductor block  . VESICOVAGINAL FISTULA CLOSURE W/ TAH  1981    Social History:  reports that she quit smoking about 8 years ago. Her smoking use included cigarettes. She started smoking about 43 years ago. She has a 10.00 pack-year smoking history. She has never used smokeless tobacco. She reports previous alcohol use. She reports that she does not use drugs.   Allergies  Allergen Reactions  . Penicillins Other (See Comments)    Blisters in mouth Did it involve swelling of the face/tongue/throat, SOB, or low BP? No Did it involve sudden or severe rash/hives, skin peeling, or any reaction on the inside of your mouth or nose? No Did you need to seek medical attention at a hospital or doctor's office? No When did it last happen?Childhood allergy If all above answers are "NO", may proceed with cephalosporin use.   . Codeine Nausea And Vomiting  . Erythromycin Nausea And Vomiting  . Fenofibrate Other (See Comments)    aching & edema    . Lyrica [Pregabalin]  Other (See Comments)    Edema in feet   . Statins Other (See Comments)    Joints ache  . Sulfa Antibiotics Nausea And Vomiting    Family History  Problem Relation Age of Onset  . Diabetes Mother   . Heart failure Mother   . Hypertension Mother   . Cirrhosis Father   . Cirrhosis Brother     Prior to Admission medications   Medication Sig Start Date End Date Taking? Authorizing Provider  ALPRAZolam (XDuanne Moron0.5 MG tablet Take 1 tablet (0.5 mg total) by mouth 2 (two) times daily as needed for anxiety. Take 1-2 tabs daily as needed 04/12/19  Yes Dettinger, JoFransisca KaufmannMD  ARIPiprazole (ABILIFY) 2 MG tablet Take 1 tablet (2 mg total) by mouth daily. 04/12/19  Yes Dettinger, JoFransisca KaufmannMD  aspirin EC 81 MG tablet Take 81 mg by mouth daily.   Yes [provider]  blood glucose meter kit and supplies KIT Dispense based on patient and insurance preference. Use up to two times daily as directed. (Dx: type 2 DM - E11.9) 04/30/16  Yes Chipper Herb, MD  Blood Glucose Monitoring Suppl [facility] VERIO) w/Device KIT Test BS BID Dx E11.9 01/10/19  Yes Dettinger, Fransisca Kaufmann, MD  Cholecalciferol (VITAMIN D) 50 MCG (2000 UT) tablet Take 2,000 Units by mouth daily.    Yes [provider]  escitalopram (LEXAPRO) 20 MG tablet Take 1 tablet (20 mg total) by mouth daily. 04/12/19  Yes Dettinger, Fransisca Kaufmann, MD  esomeprazole (NEXIUM) 40 MG capsule Take 1 capsule (40 mg total) by mouth daily. 04/12/19  Yes Dettinger, Fransisca Kaufmann, MD  ezetimibe (ZETIA) 10 MG tablet Take 1 tablet (10 mg total) by mouth daily. 04/12/19  Yes Dettinger, Fransisca Kaufmann, MD  gabapentin (NEURONTIN) 600 MG tablet Take 1 tablet (600 mg total) by mouth 4 (four) times daily. 04/12/19  Yes Dettinger, Fransisca Kaufmann, MD  glucose blood (ONE TOUCH ULTRA TEST) test strip CHECK BLOOD SUGAR UP TO 2 TIMES A DAY 04/20/18  Yes Chipper Herb, MD  HYDROcodone-acetaminophen (NORCO/VICODIN) 5-325 MG tablet Take 1 tablet by mouth every 6 (six) hours as needed for  moderate pain.   Yes [provider]  lisinopril-hydrochlorothiazide (ZESTORETIC) 20-25 MG tablet Take 1 tablet by mouth daily. 04/12/19  Yes Dettinger, Fransisca Kaufmann, MD  metFORMIN (GLUCOPHAGE-XR) 500 MG 24 hr tablet Take 2 tablets (1,000 mg total) by mouth daily with breakfast. 04/12/19  Yes Dettinger, Fransisca Kaufmann, MD  Misc Natural Products (LUTEIN 20 PO) Take by mouth daily.   Yes [provider]  Omega-3 Fatty Acids (FISH OIL) 1000 MG CAPS Take by mouth. Take 2 pills daily   Yes [provider]  [facility] DELICA LANCETS 33A MISC CHECK BLOOD SUGAR UP TO TWICE DAILY 04/20/18  Yes Chipper Herb, MD  Pitavastatin Calcium (LIVALO) 2 MG TABS Take 1 tablet (2 mg total) by mouth every other day. 04/12/19  Yes Dettinger, Fransisca Kaufmann, MD  Semaglutide, 1 MG/DOSE, (OZEMPIC, 1 MG/DOSE,) 2 MG/1.5ML SOPN Inject 1 mg into the skin once a week. 04/12/19  Yes Dettinger, Fransisca Kaufmann, MD     Objective    Physical Exam: Vitals:   05/03/19 1714 05/03/19 1830 05/03/19 1900 05/03/19 2000  BP: 116/60 (!) 115/57 118/61 (!) 107/59  Pulse: 80 80 80 67  Resp: 17  (!) 31 17  Temp: 98.3 F (36.8 C)     TempSrc: Oral     SpO2: 96% 96% 98% 94%    General: appears to be stated age; alert, oriented Skin: warm, dry, no rash Head:  AT/[place] Eyes:  PEARL b/l, EOMI Mouth:  Oral mucosa membranes appear moist, normal dentition Neck: supple; trachea midline Heart:  RRR; did not appreciate any M/R/G Lungs: CTAB, did not appreciate any wheezes, rales, or rhonchi Abdomen: + BS; soft, ND, NT Vascular: 2+ pedal pulses b/l; 2+ radial pulses b/l Extremities: no peripheral edema, no muscle wasting Neuro: 5/5 strength of the proximal and distal flexors and extensors of the upper and lower extremities bilaterally; sensation intact in upper and lower extremities b/l; cranial nerves II through XII grossly intact; no pronator drift; no evidence suggestive of slurred speech, dysarthria, or facial droop; normal heel-to-shin  bilaterally; normal finger to nose bilaterally; no evidence of dysdiadochokinesia. Normal muscle tone. No tremors. 2+ DTR's in upper and lower extremities bilaterally.     Labs on Admission: I have personally reviewed following labs and imaging studies  CBC: Recent Labs  Lab 05/03/19 1745  WBC 8.3  HGB 13.0  HCT 41.7  MCV 87.6  PLT 240   Basic  Metabolic Panel: Recent Labs  Lab 05/03/19 1745  NA 135  K 3.8  CL 98  CO2 27  GLUCOSE 136*  BUN 11  CREATININE 0.69  CALCIUM 9.2   GFR: CrCl cannot be calculated (Unknown ideal weight.). Liver Function Tests: No results for input(s): AST, ALT, ALKPHOS, BILITOT, PROT, ALBUMIN in the last 168 hours. No results for input(s): LIPASE, AMYLASE in the last 168 hours. No results for input(s): AMMONIA in the last 168 hours. Coagulation Profile: No results for input(s): INR, PROTIME in the last 168 hours. Cardiac Enzymes: No results for input(s): CKTOTAL, CKMB, CKMBINDEX, TROPONINI in the last 168 hours. BNP (last 3 results) No results for input(s): PROBNP in the last 8760 hours. HbA1C: No results for input(s): HGBA1C in the last 72 hours. CBG: No results for input(s): GLUCAP in the last 168 hours. Lipid Profile: No results for input(s): CHOL, HDL, LDLCALC, TRIG, CHOLHDL, LDLDIRECT in the last 72 hours. Thyroid Function Tests: No results for input(s): TSH, T4TOTAL, FREET4, T3FREE, THYROIDAB in the last 72 hours. Anemia Panel: No results for input(s): VITAMINB12, FOLATE, FERRITIN, TIBC, IRON, RETICCTPCT in the last 72 hours. Urine analysis:    Component Value Date/Time   COLORURINE YELLOW 01/04/2008 1005   APPEARANCEUR Clear 10/06/2018 0813   LABSPEC 1.018 01/04/2008 1005   PHURINE 6.5 01/04/2008 1005   GLUCOSEU Negative 10/06/2018 0813   HGBUR NEGATIVE 01/04/2008 1005   BILIRUBINUR Negative 10/06/2018 0813   KETONESUR NEGATIVE 01/04/2008 1005   PROTEINUR Negative 10/06/2018 0813   PROTEINUR NEGATIVE 01/04/2008 1005    UROBILINOGEN negative 08/18/2012 0914   UROBILINOGEN 0.2 01/04/2008 1005   NITRITE Negative 10/06/2018 0813   NITRITE NEGATIVE 01/04/2008 1005   LEUKOCYTESUR Trace (A) 10/06/2018 0813    Radiological Exams on Admission: DG Chest 2 View  Result Date: 05/03/2019 CLINICAL DATA:  Chest pain with left-sided numbness and weakness. EXAM: CHEST - 2 VIEW COMPARISON:  December 08, 2017 FINDINGS: Mild, stable linear scarring is seen within the lateral aspect of the left lung base. There is no evidence of acute infiltrate, pleural effusion or pneumothorax. The heart size and mediastinal contours are within normal limits. The visualized skeletal structures are unremarkable. IMPRESSION: No active cardiopulmonary disease. Electronically Signed   By: Virgina Norfolk M.D.   On: 05/03/2019 18:45   CT Head Wo Contrast  Result Date: 05/03/2019 CLINICAL DATA:  TIA. EXAM: CT HEAD WITHOUT CONTRAST TECHNIQUE: Contiguous axial images were obtained from the base of the skull through the vertex without intravenous contrast. COMPARISON:  MR brain dated November 03, 2018. CT head dated November 01, 2018. FINDINGS: Brain: No evidence of acute infarction, hemorrhage, hydrocephalus, extra-axial collection or mass lesion/mass effect. Stable mild atrophy and chronic microvascular ischemic changes. Vascular: Atherosclerotic vascular calcification of the carotid siphons. No hyperdense vessel. Skull: Normal. Negative for fracture or focal lesion. Sinuses/Orbits: No acute finding. Other: None. IMPRESSION: No acute intracranial abnormality. Electronically Signed   By: Titus Dubin M.D.   On: 05/03/2019 19:21     EKG: Independently reviewed, with result as described above.    Assessment/Plan   [name] is a 74 y.o. 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    Principal Problem:  Atypical chest pain Active Problems:   Essential hypertension, benign   Diabetes (HCC)   Depression   GERD (gastroesophageal reflux disease)   TIA (transient ischemic attack)   #) Atypical Chest Pain: Intermittent episodes of sharp left-sided chest discomfort that does not worsen with exertion, does not improve with rest, and significantly improves with GI cocktail.  Patient has not received any nitroglycerin as consequence of her chest pain. Therefore, chest pain appears to be atypical for ACS.  However, given the presence of some typical characteristics in this patient with multiple CAD risk factors, no prior cardiac ischemic evaluation and a presenting HEART score of 4 conferring a moderate probability of major acute cardiac event over the ensuing 6 weeks, the decision was made to admit the patient for overnight observation for ACS rule out. Of note, Troponin x 2 negative, EKG shows no evidence of acute ischemic changes, including no evidence of STEMI, and CXR showed no acute CP process, including no evidence of pneumothorax. Patient currently CP free.  She received aspirin 162 mg p.o. x1 in the ED this evening, which was in addition to dose of baby aspirin taken at home earlier today.  Given that the patient is also being admitted with suspected TIA, as further described below, corresponding ensuing echocardiogram with bubble study may provide some diagnostic benefit in comparison with prior echocardiogram performed in August 2020 in terms of evaluating for development of interval focal wall motion abnormalities. Of note, the patient politely refuses high intensity statin on the basis of prior myalgias/arthralgias that she believes were associated with a high intensity statin.   In  terms of non-cardiac etiologies relating to presenting chest pain, differential includes musculoskeletal possibilities, including costochondritis as well as GERD vs esophageal stricture vs gastritis vs anxiety.  Current differential appears to favor GI possibilities given significant improvement in chest discomfort following administration of GI cocktail, as above. Presentation is less suggestive of acute PE.    Plan: Trend serial troponin, with next to occur with morning labs.  Monitor on telemetry.  As needed sublingual nitroglycerin.  As needed EKG for subsequent episodes of chest pain.  Continue home aspirin 81 mg p.o. every morning. Will follow for results of echocardiogram has been ordered for tomorrow morning, including to assess for evidence of interval development of focal wall motion abnormalities relative to echo from August 2020.  given possibility of GI source, can also consider additional prn GI cocktail.       #) TIA: TIA suspected following 2 self-limited episodes of left-sided numbness/paresthesias, with most recent such episode starting at approximately 1800 on 05/02/19 before completely resolving in less than 10 minutes following onset, without any residual or subsequent acute focal neurologic deficits. Non-contrast CT head performed in ED this evening shows no acute intracranial process. Dr. Mike Craze of telestroke neurology was consulted, and felt that presentation was consistent with a TIA.  He recommended admission to hospitalist service for further evaluation and management of such, including echo with bubble study, MRI of the brain, MRA of the head and neck, lipid panel, and hemoglobin A1c.  Additionally, he recommended initiation of dual anti-platelet therapy via the addition of Plavix 75 mg p.o. daily to existing outpatient daily baby aspirin. There is no indication for TPA given presentation outside of the window for such as well as complete interval resolution of transient acute focal  neurologic deficit.   The patient possesses multiple modifiable risk factors for acute ischemic CVA, including type 2 diabetes mellitus, hypertension, hyperlipidemia.  She also is a former smoker, reporting that she completely quit smoking approximately 5 years ago.  Denies any known history of atrial fibrillation or obstructive sleep apnea.  Outpatient antiplatelet regimen consists of daily baby aspirin in the absence of any formal anticoagulation.  She is also on the following antilipid medications at home: Zetia and Pitavastatin. Dr. Mike Craze recommended initiation of dual antiplatelet therapy via the addition of Plavix to baby aspirin.  Of note, he did not recommend any escalation of outpatient antilipid therapy at this time. ASA 162 mg PO x 1 administered in the ED this evening, as above.  Will engage in identification and modification of acute ischemic CVA risk factors, as further detailed below.    Plan: Nursing bedside swallow evaluation x1 now, and will not initiate oral medications or diet until the patient has passed this.  Add Plavix 75 mg p.o. daily to existing daily baby aspirin per recommendations of telestroke neurologist, as above.  MRI of the brain and MRA of the head/neck have been ordered for the morning.  Also ordered TTE with bubble study to evaluate for intracardiac thrombus, septal aneurysm, or septal wall defect.  Check lipid panel as well as hemoglobin A1c in the morning.  Neuro checks per protocol.  Head of the bed at 30 degrees.  Monitor on telemetry, with close attention for any evidence to suggest paroxysmal atrial fibrillation.  Physical therapy and Occupational Therapy consults have been ordered for the morning.  Counseled the patient on the importance of continued abstinence from smoking.  Will allow for permissive hypertension for 24-48 hours following onset of acute focal neurologic deficits, with conclusion of this period to occur around 1800 on 05/04/19.       #) Chronic  diastolic heart failure: Most recent echocardiogram was performed in August 2020, with results as further detailed above, including evidence of a hyperdynamic left ventricle and associated evidence of impaired relaxation in the absence of any focal wall motion abnormalities.  Not on any chronic diuretic therapy as an outpatient.  No evidence of acutely decompensated heart failure at this time.  Plan: Monitor strict I's and O's and daily weights.  Will follow for result of echocardiogram with bubble study that is been ordered for tomorrow morning, as further described above.      #) Essential hypertension: Outpatient antihypertensive regimen includes lisinopril and HCTZ.  Will allow for permissive hypertension until approximately 1800 on 05/04/19 as component of management of suspected TIA, as further described above.  Systolic blood pressures in the ED this evening appear to be normotensive.  Plan: Hold home antihypertensive medications for now and observance of permissive hypertension, as outlined above.  Close monitoring of ensuing blood pressures per serial vital signs as outlined in stroke protocol.       #) Type 2 Diabetes Mellitus: presenting blood sugar noted to be 136 per presenting BMP. Home insulin regimen: None. Home oral hypoglycemic agents: Metformin.  Additionally, the patient is on a GLP-1 agonist in the form of q. weekly injections of Semaglutide.  As a component of evaluation for potential risk factor modification for acute ischemic CVA, will check hemoglobin A1c to evaluate the adequacy of patient's home glycemic control.  Of note, her diabetes mellitus is complicated by diabetic polyperipheral neuropathy, for which she is on scheduled gabapentin at home.  Plan: accuchecks QAC and HS with low dose sliding scale insulin.  Hold home Metformin and semaglutide.  Check hemoglobin A1c, as above.  Continue home gabapentin.      #)  GERD: On daily Nexium at home.  In the context of  potential GERD related factors contributing to presenting chest discomfort, could consider adding H2 blocker to home PPI.  Plan: For now, continue home PPI.      #) Depression: On Lexapro as well as Abilify as an outpatient.  Plan: Continue home Abilify and Lexapro.    DVT prophylaxis: SCDs Code Status: Full code Family Communication: None Disposition Plan:  Per Rounding Team Consults called: Dr. Mike Craze of telestroke neurology, as above.  Admission status: observation;    PLEASE NOTE THAT DRAGON DICTATION SOFTWARE WAS USED IN THE CONSTRUCTION OF THIS NOTE.   [place] [facility] Pager [phone] From 3PM- 11PM.   Otherwise, please contact night-coverage  [email or web address] Password [facility]  05/03/2019, 9:49 PM

## 2019-05-03 NOTE — Consult Note (Signed)
[facility] TeleNeurology Consult Services  Stat Consult  Date of Service:   05/03/2019 20:05:46  Impression:     .  G45.9 - Transient cerebral ischaemic attack, unspecified  Comments/Sign-Out: Patient history and exam is suggestive of a right brain TIA. I would recommend keeping her on her aspirin 81 daily and adding a Plavix 75 mg daily to it. I would suggest getting an MRI of the head along with MRA of the head and neck, echocardiogram, telemetry monitoring, lipid panel and hemoglobin A1c testing and neurology consultation. She should be admitted for further work-up of this TIA. I spent extensive time on stroke counseling with the patient and all her questions and concerns were addressed.  CT HEAD: Showed No Acute Hemorrhage or Acute Core Infarct  Metrics: [facility] Notification Time: 05/03/2019 20:04:04 Stamp Time: 05/03/2019 20:05:46 Callback Response Time: 05/03/2019 20:08:15  Our recommendations are outlined below.  Recommendations:     .  Initiate Aspirin 81 MG Daily     .  Initiate Plavix 75 MG Daily   Imaging Studies:     .  MRI Head     .  MRA Head and Neck Without Contrast When Available - Stroke Protocol     .  Echocardiogram - Transthoracic Echocardiogram  Disposition: Neurology Follow Up Recommended  Sign Out:     .  Discussed with Emergency Department Provider  ----------------------------------------------------------------------------------------------------  Chief Complaint: left sided weakness  History of Present Illness: Patient is a 74 year old Female.  Extremely pleasant 74 year old white female with past medical history of diabetes, hypertension, hyperlipidemia came to the hospital because of episode of left arm, left face and left leg numbness and weakness. She had this episode yesterday and lasted for few minutes and resolved. She had a similar episode involving left face and arm yesterday evening. She was having  some chest pain and left arm pain and came to the hospital after discussing this with her primary care physician. She reports she does not have any further weakness. Denies any gait or balance issues. Denies any visual problems. Denies any swallowing issues.    Past Medical History:     . Hypertension     . Diabetes Mellitus     . Hyperlipidemia  Anticoagulant use:  No  Antiplatelet use: Aspirin 81     Examination: BP(107/59), Pulse(67), Blood Glucose(136) 1A: Level of Consciousness - Alert; keenly responsive + 0 1B: Ask Month and Age - Both Questions Right + 0 1C: Blink Eyes & Squeeze Hands - Performs Both Tasks + 0 2: Test Horizontal Extraocular Movements - Normal + 0 3: Test Visual Fields - No Visual Loss + 0 4: Test Facial Palsy (Use Grimace if Obtunded) - Normal symmetry + 0 5A: Test Left Arm Motor Drift - No Drift for 10 Seconds + 0 5B: Test Right Arm Motor Drift - No Drift for 10 Seconds + 0 6A: Test Left Leg Motor Drift - No Drift for 5 Seconds + 0 6B: Test Right Leg Motor Drift - No Drift for 5 Seconds + 0 7: Test Limb Ataxia (FNF/Heel-Shin) - No Ataxia + 0 8: Test Sensation - Normal; No sensory loss + 0 9: Test Language/Aphasia - Normal; No aphasia + 0 10: Test Dysarthria - Normal + 0 11: Test Extinction/Inattention - No abnormality + 0  NIHSS Score: 0  Patient/Family was informed the Neurology Consult would happen via TeleHealth consult by way of interactive audio and video telecommunications and consented to receiving care in this  manner.  Due to the immediate potential for life-threatening deterioration due to underlying acute neurologic illness, I spent 30 minutes providing critical care. This time includes time for face to face visit via telemedicine, review of medical records, imaging studies and discussion of findings with providers, the patient and/or family.   Dr Faustino Congress   [facility] [phone]  Case PH:[id number]

## 2019-05-03 NOTE — ED Notes (Signed)
Pt to xray

## 2019-05-03 NOTE — Telephone Encounter (Signed)
Patient aware and verbalizes understanding.  Agreed to go to [facility] ER.

## 2019-05-03 NOTE — ED Provider Notes (Signed)
[facility] EMERGENCY DEPARTMENT Provider Note   CSN: [id number] Arrival date & time: 05/03/19  1656     History Chief Complaint  Patient presents with  . Chest Pain  . Arm Pain    [name] is a 74 y.o. female.  HPI  HPI: A 74 year old patient with a history of treated diabetes, hypertension, hypercholesterolemia and obesity presents for evaluation of chest pain. Initial onset of pain was less than one hour ago. The patient's chest pain is sharp and is not worse with exertion. The patient complains of nausea. The patient's chest pain is middle- or left-sided, is not well-localized, is not described as heaviness/pressure/tightness and does radiate to the arms/jaw/neck. The patient denies diaphoresis. The patient has no history of stroke, has no history of peripheral artery disease, has not smoked in the past 90 days and has no relevant family history of coronary artery disease (first degree relative at less than age 4).   Patient has history of GERD.  She reports that she started having chest pain on the left side that appears to be similar to her GERD.  The pain has been constant.  It is radiating to her back.  However later in the day she started having arm pain which got her concerned and she called her PCP who advised her to come to the ER.  Additionally patient informs [place] that she had 2 episodes of left-sided numbness.  Her first episode was on Monday, where she had complete left-sided numbness that lasted for about 10 or 15 minutes.  Thereafter yesterday she had another episode where only her face started tingling.   Past Medical History:  Diagnosis Date  . Allergy    seasonal  . Arthritis    [place]   . Colon polyps   . Complication of anesthesia    per pt, she woke up in the middle of last colon in 2014.  . Depression   . Diabetes mellitus, type 2 ([place]) 06/2010  . Diabetic neuropathy ([place])   . Diverticulosis    pt unaware  . Esophageal stricture   . Fibromyalgia     [place]   . GERD (gastroesophageal reflux disease)   . Hemorrhoids   . Hiatal hernia   . Hyperlipidemia   . Hypertension   . Menopause   . Numbness   . Peripheral vascular insufficiency ([place])   . Retinopathy    Bilateral  . Tremor   . Vitamin D deficiency     Patient Active Problem List   Diagnosis Date Noted  . Unsteady gait 11/03/2018  . Cerebrovascular accident (CVA) ([place]) 11/03/2018  . Cerebellar ataxia in diseases classified elsewhere ([place]) 11/03/2018  . 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 Aortic atherosclerosis ([place]) 12/08/2017  . Hardening of the aorta (main artery of the heart) ([place]) 12/08/2017  . 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 Depression 02/28/2013  . GERD (gastroesophageal reflux disease) 02/28/2013  . Hiatal hernia with gastroesophageal reflux 02/28/2013  . Peripheral neuropathy 02/28/2013  . DDD (degenerative disc disease), lumbosacral 06/23/2012  . Fibromyalgia syndrome 06/23/2012  . Essential hypertension, benign 06/23/2012  . Osteopenia 06/23/2012  . Diabetes ([place]) 06/23/2012  . Abnormal EKG 10/29/2010  . Obesity due to excess calories 10/29/2010  . 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 DYSPHAGIA 10/11/2009  . PERSONAL HISTORY OF COLONIC POLYPS 10/11/2009    Past Surgical History:  Procedure Laterality Date  . ABDOMINAL HYSTERECTOMY     partial and then total  . ANKLE  SURGERY Right   . COLONOSCOPY    . ELBOW SURGERY     left  . KNEE ARTHROSCOPY Right 09/12/2018   Procedure: ARTHROSCOPY KNEE; SYNOVECTOMY;  Surgeon: Gaynelle Arabian, MD;  Location: [facility] ORS;  Service: Orthopedics;  Laterality: Right;  37mn  . REFRACTIVE SURGERY  2003  . TOTAL KNEE ARTHROPLASTY     bilateral  . TOTAL KNEE REVISION Right 03/09/2018   Procedure: RIGHT TOTAL KNEE REVISION;  Surgeon: AGaynelle Arabian MD;  Location: [facility] ORS;  Service: Orthopedics;  Laterality: Right;  1292m with abductor block  . VESICOVAGINAL FISTULA  CLOSURE W/ TAH  1981     OB History   No obstetric history on file.     Family History  Problem Relation Age of Onset  . Diabetes Mother   . Heart failure Mother   . Hypertension Mother   . Cirrhosis Father   . Cirrhosis Brother     Social History   Tobacco Use  . Smoking status: Former Smoker    Packs/day: 1.00    Years: 10.00    Pack years: 10.00    Types: Cigarettes    Start date: 11/07/1975    Quit date: 06/24/2010    Years since quitting: 8.8  . Smokeless tobacco: Never Used  Substance Use Topics  . Alcohol use: Not Currently  . Drug use: No    Home Medications Prior to Admission medications   Medication Sig Start Date End Date Taking? Authorizing Provider  ALPRAZolam (XDuanne Moron0.5 MG tablet Take 1 tablet (0.5 mg total) by mouth 2 (two) times daily as needed for anxiety. Take 1-2 tabs daily as needed 04/12/19  Yes Dettinger, JoFransisca KaufmannMD  ARIPiprazole (ABILIFY) 2 MG tablet Take 1 tablet (2 mg total) by mouth daily. 04/12/19  Yes Dettinger, JoFransisca KaufmannMD  aspirin EC 81 MG tablet Take 81 mg by mouth daily.   Yes [provider]  blood glucose meter kit and supplies KIT Dispense based on patient and insurance preference. Use up to two times daily as directed. (Dx: type 2 DM - E11.9) 04/30/16  Yes MoChipper HerbMD  Blood Glucose Monitoring Suppl ([facility]) w/Device KIT Test BS BID Dx E11.9 01/10/19  Yes Dettinger, JoFransisca KaufmannMD  Cholecalciferol (VITAMIN D) 50 MCG (2000 UT) tablet Take 2,000 Units by mouth daily.    Yes [provider]  escitalopram (LEXAPRO) 20 MG tablet Take 1 tablet (20 mg total) by mouth daily. 04/12/19  Yes Dettinger, JoFransisca KaufmannMD  esomeprazole (NEXIUM) 40 MG capsule Take 1 capsule (40 mg total) by mouth daily. 04/12/19  Yes Dettinger, JoFransisca KaufmannMD  ezetimibe (ZETIA) 10 MG tablet Take 1 tablet (10 mg total) by mouth daily. 04/12/19  Yes Dettinger, JoFransisca KaufmannMD  gabapentin (NEURONTIN) 600 MG tablet Take 1 tablet (600 mg total) by mouth 4  (four) times daily. 04/12/19  Yes Dettinger, JoFransisca KaufmannMD  glucose blood (ONE TOUCH ULTRA TEST) test strip CHECK BLOOD SUGAR UP TO 2 TIMES A DAY 04/20/18  Yes MoChipper HerbMD  HYDROcodone-acetaminophen (NORCO/VICODIN) 5-325 MG tablet Take 1 tablet by mouth every 6 (six) hours as needed for moderate pain.   Yes [provider]  lisinopril-hydrochlorothiazide (ZESTORETIC) 20-25 MG tablet Take 1 tablet by mouth daily. 04/12/19  Yes Dettinger, JoFransisca KaufmannMD  metFORMIN (GLUCOPHAGE-XR) 500 MG 24 hr tablet Take 2 tablets (1,000 mg total) by mouth daily with breakfast. 04/12/19  Yes Dettinger, JoFransisca KaufmannMD  Misc Natural Products (LUTEIN  20 PO) Take by mouth daily.   Yes [provider]  Omega-3 Fatty Acids (FISH OIL) 1000 MG CAPS Take by mouth. Take 2 pills daily   Yes [provider]  [facility] DELICA LANCETS 82N MISC CHECK BLOOD SUGAR UP TO TWICE DAILY 04/20/18  Yes Chipper Herb, MD  Pitavastatin Calcium (LIVALO) 2 MG TABS Take 1 tablet (2 mg total) by mouth every other day. 04/12/19  Yes Dettinger, Fransisca Kaufmann, MD  Semaglutide, 1 MG/DOSE, (OZEMPIC, 1 MG/DOSE,) 2 MG/1.5ML SOPN Inject 1 mg into the skin once a week. 04/12/19  Yes Dettinger, Fransisca Kaufmann, MD    Allergies    Penicillins, Codeine, Erythromycin, Fenofibrate, Lyrica [pregabalin], Statins, and Sulfa antibiotics  Review of Systems   Review of Systems  Constitutional: Positive for activity change.  Cardiovascular: Positive for chest pain.  Gastrointestinal: Positive for nausea. Negative for vomiting.  Allergic/Immunologic: Negative for immunocompromised state.  Neurological: Positive for numbness.  Hematological: Does not bruise/bleed easily.  All other systems reviewed and are negative.   Physical Exam Updated Vital Signs BP (!) 107/59   Pulse 67   Temp 98.3 F (36.8 C) (Oral)   Resp 17   SpO2 94%   Physical Exam Vitals and nursing note reviewed.  Constitutional:      Appearance: She is well-developed.  HENT:      Head: Normocephalic and atraumatic.  Cardiovascular:     Rate and Rhythm: Normal rate.  Pulmonary:     Effort: Pulmonary effort is normal.  Abdominal:     General: Bowel sounds are normal.  Musculoskeletal:     Cervical back: Normal range of motion and neck supple.  Skin:    General: Skin is warm and dry.  Neurological:     Mental Status: She is alert and oriented to person, place, and time.     Cranial Nerves: No cranial nerve deficit.     Motor: No weakness.     Comments: Cerebellar exam is normal (finger to nose) Sensory exam normal for bilateral upper and lower extremities - and patient is able to discriminate between sharp and dull. Motor exam is 4+/5      ED Results / Procedures / Treatments   Labs (all labs ordered are listed, but only abnormal results are displayed) Labs Reviewed  BASIC METABOLIC PANEL - Abnormal; Notable for the following components:      Result Value   Glucose, Bld 136 (*)    All other components within normal limits  SARS CORONAVIRUS 2 (TAT 6-24 HRS)  CBC  TROPONIN I (HIGH SENSITIVITY)  TROPONIN I (HIGH SENSITIVITY)    EKG EKG Interpretation  Date/Time:  Wednesday May 03 2019 17:10:00 EST Ventricular Rate:  75 PR Interval:  170 QRS Duration: 92 QT Interval:  394 QTC Calculation: 439 R Axis:   19 Text Interpretation: Normal sinus rhythm Septal infarct , age undetermined Abnormal ECG No acute changes No significant change since last tracing Confirmed by Varney Biles [phone]) on 05/03/2019 5:38:08 PM   Radiology DG Chest 2 View  Result Date: 05/03/2019 CLINICAL DATA:  Chest pain with left-sided numbness and weakness. EXAM: CHEST - 2 VIEW COMPARISON:  December 08, 2017 FINDINGS: Mild, stable linear scarring is seen within the lateral aspect of the left lung base. There is no evidence of acute infiltrate, pleural effusion or pneumothorax. The heart size and mediastinal contours are within normal limits. The visualized skeletal  structures are unremarkable. IMPRESSION: No active cardiopulmonary disease. Electronically Signed   By: Hoover Browns  [place] M.D.   On: 05/03/2019 18:45   CT Head Wo Contrast  Result Date: 05/03/2019 CLINICAL DATA:  TIA. EXAM: CT HEAD WITHOUT CONTRAST TECHNIQUE: Contiguous axial images were obtained from the base of the skull through the vertex without intravenous contrast. COMPARISON:  MR brain dated November 03, 2018. CT head dated November 01, 2018. FINDINGS: Brain: No evidence of acute infarction, hemorrhage, hydrocephalus, extra-axial collection or mass lesion/mass effect. Stable mild atrophy and chronic microvascular ischemic changes. Vascular: Atherosclerotic vascular calcification of the carotid siphons. No hyperdense vessel. Skull: Normal. Negative for fracture or focal lesion. Sinuses/Orbits: No acute finding. Other: None. IMPRESSION: No acute intracranial abnormality. Electronically Signed   By: Titus Dubin M.D.   On: 05/03/2019 19:21    Procedures Procedures (including critical care time)  Medications Ordered in ED Medications  nitroGLYCERIN (NITROSTAT) SL tablet 0.4 mg (has no administration in time range)  aspirin chewable tablet 162 mg (162 mg Oral Given 05/03/19 1855)  alum & mag hydroxide-simeth (MAALOX/MYLANTA) 200-200-20 MG/5ML suspension 30 mL (30 mLs Oral Given 05/03/19 1856)    And  lidocaine (XYLOCAINE) 2 % viscous mouth solution 15 mL (15 mLs Oral Given 05/03/19 1856)    ED Course  I have reviewed the triage vital signs and the nursing notes.  Pertinent labs & imaging results that were available during my care of the patient were reviewed by me and considered in my medical decision making (see chart for details).    MDM Rules/Calculators/A&P HEAR Score: 75                    74 year old comes in a chief complaint of chest pain and left-sided numbness.  It appears that the 2 symptoms are unrelated.  She started having some chest discomfort this morning.  She thinks it is like  GERD, however she informs me that the pain is off and on and she has left-sided arm pain which is unusual.  Hear score is 5 given the high risk factors for ACS.  There is no pleuritic component to the pain.  Differential diagnosis essentially includes gastritis and ACS for the chest discomfort.  Additionally she had a couple of episode of left-sided numbness that was also intermittent.  TIA is most likely the cause.  Neuro exam right now is nonfocal.  Her vascular exam is completely normal.  She used to smoke a pack a day but quit about 4 years ago.  She is not tachycardic, not hypertensive and radial plus femoral pulses are equal in bilateral upper and lower extremities.  I doubt this is dissection despite her having chest discomfort and neurologic issues.  We will consult teleneuro.  9:43 PM Still awaiting teleneuro recommendations.  Patient's delta troponin are negative.  We will look for admission. She informs me that the GI cocktail that alleviates some of her chest discomfort.   Final Clinical Impression(s) / ED Diagnoses Final diagnoses:  Precordial chest pain  TIA (transient ischemic attack)    Rx / DC Orders ED Discharge Orders    None       Varney Biles, MD 05/03/19 2143

## 2019-05-03 NOTE — Telephone Encounter (Signed)
Patient states that last night she felt left sided numbness in her body that lasted 2-3 mins. States that today she has been having left arm pain and feel like she is having heart burn.  States she is concerned she had a stroke- advised to go to ER but patient would like me to ask provider. Please advise

## 2019-05-04 ENCOUNTER — Observation Stay (HOSPITAL_BASED_OUTPATIENT_CLINIC_OR_DEPARTMENT_OTHER): Payer: Medicare Other

## 2019-05-04 ENCOUNTER — Observation Stay (HOSPITAL_COMMUNITY): Payer: Medicare Other

## 2019-05-04 DIAGNOSIS — E669 Obesity, unspecified: Secondary | ICD-10-CM

## 2019-05-04 DIAGNOSIS — R0789 Other chest pain: Secondary | ICD-10-CM | POA: Diagnosis not present

## 2019-05-04 DIAGNOSIS — R202 Paresthesia of skin: Secondary | ICD-10-CM

## 2019-05-04 DIAGNOSIS — K219 Gastro-esophageal reflux disease without esophagitis: Secondary | ICD-10-CM | POA: Diagnosis not present

## 2019-05-04 DIAGNOSIS — E1149 Type 2 diabetes mellitus with other diabetic neurological complication: Secondary | ICD-10-CM | POA: Diagnosis not present

## 2019-05-04 DIAGNOSIS — G459 Transient cerebral ischemic attack, unspecified: Secondary | ICD-10-CM

## 2019-05-04 DIAGNOSIS — Z794 Long term (current) use of insulin: Secondary | ICD-10-CM

## 2019-05-04 LAB — LIPID PANEL
Cholesterol: 137 mg/dL (ref 0–200)
HDL: 36 mg/dL — ABNORMAL LOW (ref >40)
LDL Cholesterol: 74 mg/dL (ref 0–99)
Total CHOL/HDL Ratio: 3.8 RATIO
Triglycerides: 137 mg/dL (ref <150)
VLDL: 27 mg/dL (ref 0–40)

## 2019-05-04 LAB — GLUCOSE, CAPILLARY
Glucose-Capillary: 161 mg/dL — ABNORMAL HIGH (ref 70–99)
Glucose-Capillary: 114 mg/dL — ABNORMAL HIGH (ref 70–99)
Glucose-Capillary: 111 mg/dL — ABNORMAL HIGH (ref 70–99)

## 2019-05-04 LAB — HEMOGLOBIN A1C
Hgb A1c MFr Bld: 7.1 % — ABNORMAL HIGH (ref 4.8–5.6)
Mean Plasma Glucose: 157.07 mg/dL

## 2019-05-04 LAB — COMPREHENSIVE METABOLIC PANEL
ALT: 28 U/L (ref 0–44)
AST: 24 U/L (ref 15–41)
Albumin: 3.6 g/dL (ref 3.5–5.0)
Alkaline Phosphatase: 59 U/L (ref 38–126)
Anion gap: 11 (ref 5–15)
BUN: 13 mg/dL (ref 8–23)
CO2: 25 mmol/L (ref 22–32)
Calcium: 8.9 mg/dL (ref 8.9–10.3)
Chloride: 99 mmol/L (ref 98–111)
Creatinine, Ser: 0.6 mg/dL (ref 0.44–1.00)
GFR calc Af Amer: >60 mL/min (ref >60)
GFR calc non Af Amer: >60 mL/min (ref >60)
Glucose, Bld: 146 mg/dL — ABNORMAL HIGH (ref 70–99)
Potassium: 3.9 mmol/L (ref 3.5–5.1)
Sodium: 135 mmol/L (ref 135–145)
Total Bilirubin: 0.5 mg/dL (ref 0.3–1.2)
Total Protein: 6.6 g/dL (ref 6.5–8.1)

## 2019-05-04 LAB — SARS CORONAVIRUS 2 (TAT 6-24 HRS): SARS Coronavirus 2: NEGATIVE

## 2019-05-04 LAB — CBC
HCT: 39.1 % (ref 36.0–46.0)
Hemoglobin: 12.3 g/dL (ref 12.0–15.0)
MCH: 27.6 pg (ref 26.0–34.0)
MCHC: 31.5 g/dL (ref 30.0–36.0)
MCV: 87.7 fL (ref 80.0–100.0)
Platelets: 239 10*3/uL (ref 150–400)
RBC: 4.46 MIL/uL (ref 3.87–5.11)
RDW: 14.6 % (ref 11.5–15.5)
WBC: 7.4 10*3/uL (ref 4.0–10.5)
nRBC: 0 % (ref 0.0–0.2)

## 2019-05-04 LAB — TROPONIN I (HIGH SENSITIVITY): Troponin I (High Sensitivity): <2 ng/L (ref <18)

## 2019-05-04 LAB — MAGNESIUM: Magnesium: 2.1 mg/dL (ref 1.7–2.4)

## 2019-05-04 MED ORDER — CLOPIDOGREL BISULFATE 75 MG PO TABS: 75.0000 mg | ORAL_TABLET | Freq: Every day | ORAL | 0 refills | Status: DC

## 2019-05-04 MED ORDER — ALUM & MAG HYDROXIDE-SIMETH 200-200-20 MG/5ML PO SUSP
30.0000 mL | ORAL | Status: DC | PRN
  Administered 2019-05-04: 30 mL via ORAL
  Filled 2019-05-04: qty 30

## 2019-05-04 MED ORDER — GADOBUTROL 1 MMOL/ML IV SOLN
8.0000 mL | Freq: Once | INTRAVENOUS | Status: AC | PRN
  Administered 2019-05-04: 8 mL via INTRAVENOUS

## 2019-05-04 MED ORDER — PANTOPRAZOLE SODIUM 40 MG PO TBEC: 40.0000 mg | DELAYED_RELEASE_TABLET | Freq: Every day | ORAL | 0 refills | Status: DC

## 2019-05-04 NOTE — Progress Notes (Signed)
*  PRELIMINARY RESULTS* Echocardiogram 2D Echocardiogram has been performed with saline bubble study.  [name] 05/04/2019, 12:00 PM

## 2019-05-04 NOTE — Progress Notes (Signed)
OT Cancellation Note  Patient Details Name: [name] MRN: [id number] DOB: May 29, 1945   Cancelled Treatment:    Reason Eval/Treat Not Completed: OT screened, no needs identified, will sign off. Pt in restroom upon OT arrival, complete all tasks independently. Pt performing mobility tasks independently, reports no numbness, tingling, or weakness today. Has not had additional chest pain since last night. No further OT services required.    [name], OTR/L  [phone] 05/04/2019, 8:44 AM

## 2019-05-04 NOTE — Evaluation (Signed)
Physical Therapy Evaluation Patient Details Name: [name] MRN: [id number] DOB: Aug 17, 1945 Today's Date: 05/04/2019   History of Present Illness  [name] is a 74 y.o. 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    Clinical Impression  Patient functioning at baseline for functional mobility and gait.  Plan:  Patient discharged from physical therapy to care of nursing for ambulation daily as tolerated for length of stay.     Follow Up Recommendations No PT follow up    Equipment Recommendations  None recommended by PT    Recommendations for Other Services       Precautions / Restrictions Precautions Precautions: None Restrictions Weight Bearing Restrictions: No      Mobility  Bed Mobility Overal bed mobility: Independent                Transfers Overall transfer level: Independent                  Ambulation/Gait Ambulation/Gait assistance: Modified independent (Device/Increase time) Gait Distance (Feet): 150 Feet Assistive device: None Gait Pattern/deviations: WFL(Within Functional Limits) Gait velocity: slightly decreased   General Gait Details: demonstrates good return for ambulation in room and hallways without loss of balance  Stairs Stairs: Yes Stairs assistance: Modified independent (Device/Increase time) Stair Management: One rail Right;One rail Left;Step to pattern Number of Stairs: 10 General stair comments: Patient demonstrates good return for going up/down stairs without loss of balance  Wheelchair Mobility    Modified Rankin (Stroke Patients Only)        Balance Overall balance assessment: No apparent balance deficits (not formally assessed)                                           Pertinent Vitals/Pain Pain Assessment: No/denies pain    Home Living Family/patient expects to be discharged to:: Private residence   Available Help at Discharge: Family;Available 24 hours/day Type of Home: House Home Access: Stairs to enter Entrance Stairs-Rails: None Entrance Stairs-Number of Steps: 2 Home Layout: Two level;Able to live on main level with bedroom/bathroom Home Equipment: Gilford Rile - 2 wheels;Shower seat - built in;Cane - single point;Bedside commode      Prior Function Level of Independence: Independent         Comments: Hydrographic surveyor, drives     Hand Dominance   Dominant Hand: Left    Extremity/Trunk Assessment   Upper Extremity Assessment Upper Extremity Assessment: Overall WFL for tasks assessed    Lower Extremity Assessment Lower Extremity Assessment: Overall WFL for tasks assessed    Cervical / Trunk Assessment Cervical / Trunk Assessment: Normal  Communication   Communication: No difficulties  Cognition Arousal/Alertness: Awake/alert Behavior During Therapy: WFL for tasks assessed/performed Overall Cognitive Status: Within Functional Limits for tasks assessed                                        General Comments      Exercises     Assessment/Plan    PT Assessment  Patent does not need any further PT services  PT Problem List         PT Treatment Interventions      PT Goals (Current goals can be found in the Care Plan section)  Acute Rehab PT Goals Patient Stated Goal: return home PT Goal Formulation: With patient Time For Goal Achievement: 05/05/19 Potential to Achieve Goals: Good    Frequency     Barriers to discharge        Co-evaluation               AM-PAC PT "6 Clicks" Mobility  Outcome Measure Help needed turning from your back  to your side while in a flat bed without using bedrails?: None Help needed moving from lying on your back to sitting on the side of a flat bed without using bedrails?: None Help needed moving to and from a bed to a chair (including a wheelchair)?: None Help needed standing up from a chair using your arms (e.g., wheelchair or bedside chair)?: None Help needed to walk in hospital room?: None Help needed climbing 3-5 steps with a railing? : None 6 Click Score: 24    End of Session   Activity Tolerance: Patient tolerated treatment well Patient left: in bed Nurse Communication: Mobility status PT Visit Diagnosis: Unsteadiness on feet (R26.81);Other abnormalities of gait and mobility (R26.89);Muscle weakness (generalized) (M62.81)    Time: [id number] PT Time Calculation (min) (ACUTE ONLY): 19 min   Charges:   PT Evaluation $PT Eval Low Complexity: 1 Low PT Treatments $Therapeutic Activity: 8-22 mins        10:46 AM, 05/04/19 Lonell Grandchild, MPT Physical Therapist with [facility] [phone] office [phone] mobile phone

## 2019-05-04 NOTE — Discharge Instructions (Addendum)
Aspirin and Your Heart  Aspirin is a medicine that prevents the cells in the blood that are used for clotting, called platelets, from sticking together. Aspirin can be used to help reduce the risk of blood clots, heart attacks, and other heart-related problems. Can I take aspirin? Your health care provider will help you determine whether it is safe and beneficial for you to take aspirin daily. Taking aspirin daily may be helpful if you:  Have had a heart attack or chest pain.  Are at risk for a heart attack.  Have undergone open-heart surgery, such as coronary artery bypass surgery (CABG).  Have had coronary angioplasty or a stent.  Have had certain types of stroke or transient ischemic attack (TIA).  Have peripheral artery disease (PAD).  Have chronic heart rhythm problems such as atrial fibrillation and cannot take an anticoagulant.  Have valve disease or have had surgery on a valve. What are the risks? Daily use of aspirin can cause side effects. Some of these include:  Bleeding. Bleeding problems can be minor or serious. An example of a minor problem is a cut that does not stop bleeding. An example of a more serious problem is stomach bleeding or, rarely, bleeding into the brain. Your risk of bleeding is increased if you are also taking non-steroidal anti-inflammatory drugs (NSAIDs).  Increased bruising.  Upset stomach.  An allergic reaction. People who have nasal polyps have an increased risk of developing an aspirin allergy. General guidelines  Take aspirin only as told by your health care provider. Make sure that you understand how much you should take and what form you should take. The two forms of aspirin are: ? Non-enteric-coated.This type of aspirin does not have a coating and is absorbed quickly. This type of aspirin also comes in a chewable form. ? Enteric-coated. This type of aspirin has a coating that releases the medicine very slowly. Enteric-coated aspirin might  cause less stomach upset than non-enteric-coated aspirin. This type of aspirin should not be chewed or crushed.  Limit alcohol intake to no more than 1 drink a day for nonpregnant women and 2 drinks a day for men. Drinking alcohol increases your risk of bleeding. One drink equals 12 oz of beer, 5 oz of wine, or 1 oz of hard liquor. Contact a health care provider if you:  Have unusual bleeding or bruising.  Have stomach pain or nausea.  Have ringing in your ears.  Have an allergic reaction that causes: ? Hives. ? Itchy skin. ? Swelling of the lips, tongue, or face. Get help right away if you:  Notice that your bowel movements are bloody, dark red, or black in color.  Vomit or cough up blood.  Have blood in your urine.  Cough, have noisy breathing (wheeze), or feel short of breath.  Have chest pain, especially if the pain spreads to the arms, back, neck, or jaw.  Have a severe headache, or a headache with confusion, or dizziness. These symptoms may represent a serious problem that is an emergency. Do not wait to see if the symptoms will go away. Get medical help right away. Call your local emergency services (911 in the [place] Do not drive yourself to the hospital. Summary  Aspirin can be used to help reduce the risk of blood clots, heart attacks, and other heart-related problems.  Daily use of aspirin can increase your risk of side effects. Your health care provider will help you determine whether it is safe and beneficial for you to  take aspirin daily.  Take aspirin only as told by your health care provider. Make sure that you understand how much you can take and what form you can take. This information is not intended to replace advice given to you by your health care provider. Make sure you discuss any questions you have with your health care provider. Document Revised: 01/21/2017 Document Reviewed: 01/21/2017 Elsevier Patient Education  [place].   Chest Wall  Pain Chest wall pain is pain in or around the bones and muscles of your chest. Sometimes, an injury causes this pain. Excessive coughing or overuse of arm and chest muscles may also cause chest wall pain. Sometimes, the cause may not be known. This pain may take several weeks or longer to get better. Follow these instructions at home: Managing pain, stiffness, and swelling   If directed, put ice on the painful area: ? Put ice in a plastic bag. ? Place a towel between your skin and the bag. ? Leave the ice on for 20 minutes, 2-3 times per day. Activity  Rest as told by your health care provider.  Avoid activities that cause pain. These include any activities that use your chest muscles or your abdominal and side muscles to lift heavy items. Ask your health care provider what activities are safe for you. General instructions   Take over-the-counter and prescription medicines only as told by your health care provider.  Do not use any products that contain nicotine or tobacco, such as cigarettes, e-cigarettes, and chewing tobacco. These can delay healing after injury. If you need help quitting, ask your health care provider.  Keep all follow-up visits as told by your health care provider. This is important. Contact a health care provider if:  You have a fever.  Your chest pain becomes worse.  You have new symptoms. Get help right away if:  You have nausea or vomiting.  You feel sweaty or light-headed.  You have a cough with mucus from your lungs (sputum) or you cough up blood.  You develop shortness of breath. These symptoms may represent a serious problem that is an emergency. Do not wait to see if the symptoms will go away. Get medical help right away. Call your local emergency services (911 in the [place] Do not drive yourself to the hospital. Summary  Chest wall pain is pain in or around the bones and muscles of your chest.  Depending on the cause, it may be treated with  ice, rest, medicines, and avoiding activities that cause pain.  Contact a health care provider if you have a fever, worsening chest pain, or new symptoms.  Get help right away if you feel light-headed or you develop shortness of breath. These symptoms may be an emergency. This information is not intended to replace advice given to you by your health care provider. Make sure you discuss any questions you have with your health care provider. Document Revised: 09/23/2017 Document Reviewed: 09/23/2017 Elsevier Patient Education  [place].   Nonspecific Chest Pain, Adult Chest pain can be caused by many different conditions. It can be caused by a condition that is life-threatening and requires treatment right away. It can also be caused by something that is not life-threatening. If you have chest pain, it can be hard to know the difference, so it is important to get help right away to make sure that you do not have a serious condition. Some life-threatening causes of chest pain include:  Heart attack.  A  tear in the body's main blood vessel (aortic dissection).  Inflammation around your heart (pericarditis).  A problem in the lungs, such as a blood clot (pulmonary embolism) or a collapsed lung (pneumothorax). Some non life-threatening causes of chest pain include:  Heartburn.  Anxiety or stress.  Damage to the bones, muscles, and cartilage that make up your chest wall.  Pneumonia or bronchitis.  Shingles infection (varicella-zoster virus). Chest pain can feel like:  Pain or discomfort on the surface of your chest or deep in your chest.  Crushing, pressure, aching, or squeezing pain.  Burning or tingling.  Dull or sharp pain that is worse when you move, cough, or take a deep breath.  Pain or discomfort that is also felt in your back, neck, jaw, shoulder, or arm, or pain that spreads to any of these areas. Your chest pain may come and go. It may also be constant. Your  health care provider will do lab tests and other studies to find the cause of your pain. Treatment will depend on the cause of your chest pain. Follow these instructions at home: Medicines  Take over-the-counter and prescription medicines only as told by your health care provider.  If you were prescribed an antibiotic, take it as told by your health care provider. Do not stop taking the antibiotic even if you start to feel better. Lifestyle   Rest as directed by your health care provider.  Do not use any products that contain nicotine or tobacco, such as cigarettes and e-cigarettes. If you need help quitting, ask your health care provider.  Do not drink alcohol.  Make healthy lifestyle choices as recommended. These may include: ? Getting regular exercise. Ask your health care provider to suggest some activities that are safe for you. ? Eating a heart-healthy diet. This includes plenty of fresh fruits and vegetables, whole grains, low-fat (lean) protein, and low-fat dairy products. A dietitian can help you find healthy eating options. ? Maintaining a healthy weight. ? Managing any other health conditions you have, such as high blood pressure (hypertension) or diabetes. ? Reducing stress, such as with yoga or relaxation techniques. General instructions  Pay attention to any changes in your symptoms. Tell your health care provider about them or any new symptoms.  Avoid any activities that cause chest pain.  Keep all follow-up visits as told by your health care provider. This is important. This includes visits for any further testing if your chest pain does not go away. Contact a health care provider if:  Your chest pain does not go away.  You feel depressed.  You have a fever. Get help right away if:  Your chest pain gets worse.  You have a cough that gets worse, or you cough up blood.  You have severe pain in your abdomen.  You faint.  You have sudden, unexplained chest  discomfort.  You have sudden, unexplained discomfort in your arms, back, neck, or jaw.  You have shortness of breath at any time.  You suddenly start to sweat, or your skin gets clammy.  You feel nausea or you vomit.  You suddenly feel lightheaded or dizzy.  You have severe weakness, or unexplained weakness or fatigue.  Your heart begins to beat quickly, or it feels like it is skipping beats. These symptoms may represent a serious problem that is an emergency. Do not wait to see if the symptoms will go away. Get medical help right away. Call your local emergency services (911 in the [place] Do  not drive yourself to the hospital. Summary  Chest pain can be caused by a condition that is serious and requires urgent treatment. It may also be caused by something that is not life-threatening.  If you have chest pain, it is very important to see your health care provider. Your health care provider may do lab tests and other studies to find the cause of your pain.  Follow your health care provider's instructions on taking medicines, making lifestyle changes, and getting emergency treatment if symptoms become worse.  Keep all follow-up visits as told by your health care provider. This includes visits for any further testing if your chest pain does not go away. This information is not intended to replace advice given to you by your health care provider. Make sure you discuss any questions you have with your health care provider. Document Revised: 09/23/2017 Document Reviewed: 09/23/2017 Elsevier Patient Education  2020 [place] is sudden, severe pain in the chest and abdomen that is caused by complications from a viral infection. Pleurodynia may also be called epidemic pleurodynia or [place] disease. In most cases, pleurodynia goes away on its own in 2-5 days. What are the causes? This condition is usually caused by a virus called coxsackievirus B. In rare  cases, other viruses may cause pleurodynia. These include coxsackievirus A or echoviruses. Coxsackievirus B spreads easily from person to person (is contagious). It can be spread through:  Coughing.  Sneezing.  Close physical contact with an infected person.  Contact with the stool of an infected person. What increases the risk? This condition is more likely to develop in:  Children.  People that live in places where there are poor public health conditions (sanitation).  People that live in places where there is overcrowding. What are the signs or symptoms? The main symptom of this condition is severe chest pain that makes breathing painful. The pain is usually on one side of the body, along the lower ribs. The pain starts suddenly and may last from a few seconds to 1 minute. You might feel pain again a few minutes or hours later. These brief periods of pain usually come and go for 2-5 days. In some cases, pain may continue to come and go for as long as a month. Other symptoms of pleurodynia may include:  Abdominal pain.  Fever.  Headache.  Sore throat.  Feeling tired (fatigue).  Pain when pressing on the chest or belly.  Dry cough.  Nausea or vomiting.  Diarrhea.  Rash.  Testicle pain in men. How is this diagnosed? This condition is diagnosed based on:  Your medical history.  A physical exam.  Tests, such as: ? A throat swab. ? Stool tests. You may need to give a stool sample to your health care provider. ? Blood tests. ? Chest X-rays. How is this treated? There is no specific treatment for this condition. However, symptoms go away within days or weeks. In the meantime, your health care provider may recommend medicine to help relieve pain and fever. Follow these instructions at home: Medicines  Take over-the-counter and prescription medicines only as told by your health care provider.  If your child has pleurodynia, do not give your child aspirin  because of the association with Reye's syndrome. General instructions  Rest at home as told by your health care provider.  Return to your normal activities as told by your health care provider. Ask your health care provider what activities are safe for you.  Do not use any products that contain nicotine or tobacco, such as cigarettes, e-cigarettes, and chewing tobacco. If you need help quitting, ask your health care provider.  Drink enough fluid to keep your urine pale yellow.  Keep all follow-up visits as told by your health care provider. This is important. How is this prevented?   Wash your hands often to prevent the virus from spreading. If soap and water are not available, use alcohol-based hand sanitizer.  Make sure that other people in your household also wash their hands often.  Use a bleach-based household cleaner to clean and disinfect commonly used items and surfaces often.  Do not share personal items such as toothbrushes and eating utensils. Contact a health care provider if:  Your symptoms last longer than 5 days.  You have pain that does not get better with medicine. Get help right away if:  You have severe chest pain that is getting worse.  You have difficulty breathing.  You have a sudden, severe headache and a stiff neck. Summary  Pleurodynia is sudden, severe pain in the chest and abdomen that is caused by complications from a viral infection. It is usually caused by a virus called coxsackievirus B.  Coxsackievirus B spreads easily from person to person (is contagious).  There is no specific treatment for this condition. However, symptoms go away within days or weeks.  Follow instructions from your health care provider about rest, medicines, activity, and drinking enough fluids. Keep all follow-up visits. This information is not intended to replace advice given to you by your health care provider. Make sure you discuss any questions you have with your  health care provider. Document Revised: 07/14/2018 Document Reviewed: 01/20/2018 Elsevier Patient Education  [place].

## 2019-05-04 NOTE — Discharge Summary (Signed)
Physician Discharge Summary  LAURIANA DENES EXB:[id number] DOB: [birth year] DOA: 05/03/2019  PCP: Dettinger, Fransisca Kaufmann, MD  Admit date: 05/03/2019 Discharge date: 05/04/2019  Admitted From: Home Disposition: Home  Recommendations for Outpatient Follow-up:  1. Follow ups as below. 2. Please obtain CBC/BMP/Mag at follow up 3. May benefit from PCSK9 inhibitors given statin intolerance. 4. Please follow up on the following pending results: None  Home Health: None Equipment/Devices: None  Discharge Condition: Stable CODE STATUS: Full code  Follow-up Information    Dettinger, Fransisca Kaufmann, MD. Schedule an appointment as soon as possible for a visit in 2 week(s).   Specialties: Family Medicine, Cardiology Contact information: [place] [address] [phone]        Marcial Pacas, MD. Schedule an appointment as soon as possible for a visit in 3 week(s).   Specialty: Neurology Contact information: [address] [place] [address] [phone]           Hospital Course: 74 year old female with history of diastolic CHF, DM-2, HTN, neuropathy, GERD and obesity presented with left-sided sharp pain radiating to her back with associated nausea.  Patient had left paresthesia 2 days prior to admission that lasted 10 minutes 2 days prior to admission.  She had left-sided facial numbness the next day which also lasted about 10 minutes.  She had intermittent sharp left-sided chest pain radiating to her back with associated nausea that prompted her to come to ED.  Patient had no other neurologic symptoms such as weakness, slurred speech, facial droop, vision change, headache or gait disturbance.  In ED, hemodynamically stable.  Labs including CBC and CMP without significant finding.  High-sensitivity troponin II 0.0x3.  EKG without acute ischemic finding.  CT head negative for acute finding.  She received chewable aspirin 160 mg once.  Teleneurology consulted and recommended  aspirin and Plavix and admission for TIA work-up.  Patient's just pain resolved with GI cocktail.  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  Discussed with on-call neurology, Dr. Merlene Laughter who suggested aspirin, Plavix and a statin and outpatient follow-up with neurology in 4 weeks.  Patient to follow-up with primary care doctor 1 to 2 weeks and her neurologist in 3 to 4 weeks.  Patient was evaluated by PT/OT and no need was identified.  Discharge Diagnoses:  Left-sided paresthesia: has not had any neuro symptoms during this hospitalization.  Seems to be a chronic issue based on review of her chart.  She is followed by neurology outpatient.  CVA work-up including CTA, MRI head/MRA head and neck without acute finding other than known right PCA stenosis which could contribute to her symptoms.  Echocardiogram basically normal other than G1DD.  Neurology, Dr. Merlene Laughter recommended aspirin and Plavix as recommended by teleneurology, and outpatient follow-up in 4 weeks.  She has history of statin intolerance.  Discharged on home Livalo and Zetia.  Encouraged her to discuss PCSK9 inhibitors with a cardiologist -Optimize risk reductions.  Atypical chest pain: Likely due to GERD.  Resolved with GI cocktail.  ACS work-up including serial troponin and EKG negative. -Discharged on Protonix 40 mg daily  Controlled diabetes: A1c 7.1%. -Continue home medication  Essential hypertension: Normotensive. -Continue home medications  GERD: -PPI  Class II obesity Body mass index is 32.17 kg/m. -Could benefit from GLP-1 inhibitors. -Encourage lifestyle change.  Other  chronic medical conditions stable -Discharged on home medications.  Discharge Instructions  Discharge Instructions    Diet - low sodium heart healthy   Complete by: As directed    Diet Carb  Modified   Complete by: As directed    Discharge instructions   Complete by: As directed    It has been a pleasure taking care of you! You were hospitalized with left-sided numbness and chest pain.  After the test is we have done, we didn't see anything new other than known narrowed blood vessel in the right side of back of your brain which could be contributing to your intermittent numbness. The neurologist recommended taking Plavix in addition to Aspirin.  We think your chest pain is from heartburn or reflux. We are discharging you on Protonix. Please review your new medication list and the directions before you take your medications.  Follow-up with your primary care doctor in 1 to 2 weeks.  Follow-up with your neurologist in 2 to 3 weeks.  You may have to make a phone call as soon as possible to schedule these follow-ups.  Take care,   Increase activity slowly   Complete by: As directed      Allergies as of 05/04/2019      Reactions   Penicillins Other (See Comments)   Blisters in mouth Did it involve swelling of the face/tongue/throat, SOB, or low BP? No Did it involve sudden or severe rash/hives, skin peeling, or any reaction on the inside of your mouth or nose? No Did you need to seek medical attention at a hospital or doctor's office? No When did it last happen?Childhood allergy If all above answers are "NO", may proceed with cephalosporin use.   Codeine Nausea And Vomiting   Erythromycin Nausea And Vomiting   Fenofibrate Other (See Comments)   aching & edema    Lyrica [pregabalin] Other (See Comments)   Edema in feet   Statins Other (See Comments)   Joints ache   Sulfa Antibiotics Nausea And Vomiting      Medication List    STOP taking these medications   esomeprazole 40 MG capsule Commonly known as: Fordyce these medications   ALPRAZolam 0.5 MG tablet Commonly known as: XANAX Take 1 tablet (0.5 mg total) by mouth 2 (two) times daily as needed for  anxiety. Take 1-2 tabs daily as needed   ARIPiprazole 2 MG tablet Commonly known as: ABILIFY Take 1 tablet (2 mg total) by mouth daily.   aspirin EC 81 MG tablet Take 81 mg by mouth daily.   blood glucose meter kit and supplies Kit Dispense based on patient and insurance preference. Use up to two times daily as directed. (Dx: type 2 DM - E11.9)   clopidogrel 75 MG tablet Commonly known as: Plavix Take 1 tablet (75 mg total) by mouth daily.   escitalopram 20 MG tablet Commonly known as: LEXAPRO Take 1 tablet (20 mg total) by mouth daily.   ezetimibe 10 MG tablet Commonly known as: ZETIA Take 1 tablet (10 mg total) by mouth daily.   Fish Oil 1000 MG Caps Take by mouth. Take 2 pills daily   gabapentin 600 MG tablet Commonly known as: NEURONTIN Take 1 tablet (600 mg total) by mouth 4 (four) times daily.   glucose blood test strip Commonly known as: ONE TOUCH ULTRA TEST CHECK BLOOD SUGAR UP TO 2 TIMES A DAY   HYDROcodone-acetaminophen 5-325 MG tablet Commonly known as: NORCO/VICODIN Take 1 tablet  by mouth every 6 (six) hours as needed for moderate pain.   lisinopril-hydrochlorothiazide 20-25 MG tablet Commonly known as: ZESTORETIC Take 1 tablet by mouth daily.   Livalo 2 MG Tabs Generic drug: Pitavastatin Calcium Take 1 tablet (2 mg total) by mouth every other day.   LUTEIN 20 PO Take by mouth daily.   metFORMIN 500 MG 24 hr tablet Commonly known as: GLUCOPHAGE-XR Take 2 tablets (1,000 mg total) by mouth daily with breakfast.   OneTouch Delica Lancets 57D Misc CHECK BLOOD SUGAR UP TO TWICE DAILY   OneTouch Verio w/Device Kit Test BS BID Dx E11.9   Ozempic (1 MG/DOSE) 2 MG/1.5ML Sopn Generic drug: Semaglutide (1 MG/DOSE) Inject 1 mg into the skin once a week.   pantoprazole 40 MG tablet Commonly known as: Protonix Take 1 tablet (40 mg total) by mouth daily.   Vitamin D 50 MCG (2000 UT) tablet Take 2,000 Units by mouth daily.        Consultations:  Neurology  Procedures/Studies:  2D Echo on 1/28  1. Left ventricular ejection fraction, by visual estimation, is 60 to 65%. The left ventricle has normal function. There is no left ventricular hypertrophy.  2. Left ventricular diastolic parameters are consistent with Grade I diastolic dysfunction (impaired relaxation).  3. The left ventricle has no regional wall motion abnormalities.  4. Global right ventricle has normal systolic function.The right ventricular size is normal. No increase in right ventricular wall thickness.  5. Left atrial size was normal.  6. Right atrial size was normal.  7. Mild mitral annular calcification.  8. The mitral valve is grossly normal. No evidence of mitral valve regurgitation.  9. The tricuspid valve is grossly normal. 10. The tricuspid valve is grossly normal. Tricuspid valve regurgitation is trivial. 11. The aortic valve is tricuspid. Aortic valve regurgitation is not visualized. No evidence of aortic valve sclerosis or stenosis. 12. The pulmonic valve was grossly normal. Pulmonic valve regurgitation is trivial. 13. Normal pulmonary artery systolic pressure. 14. The inferior vena cava is normal in size with greater than 50% respiratory variability, suggesting right atrial pressure of 3 mmHg. 15. No PFO (bubble study performed).   DG Chest 2 View  Result Date: 05/03/2019 CLINICAL DATA:  Chest pain with left-sided numbness and weakness. EXAM: CHEST - 2 VIEW COMPARISON:  December 08, 2017 FINDINGS: Mild, stable linear scarring is seen within the lateral aspect of the left lung base. There is no evidence of acute infiltrate, pleural effusion or pneumothorax. The heart size and mediastinal contours are within normal limits. The visualized skeletal structures are unremarkable. IMPRESSION: No active cardiopulmonary disease. Electronically Signed   By: Virgina Norfolk M.D.   On: 05/03/2019 18:45   CT Head Wo Contrast  Result Date:  05/03/2019 CLINICAL DATA:  TIA. EXAM: CT HEAD WITHOUT CONTRAST TECHNIQUE: Contiguous axial images were obtained from the base of the skull through the vertex without intravenous contrast. COMPARISON:  MR brain dated November 03, 2018. CT head dated November 01, 2018. FINDINGS: Brain: No evidence of acute infarction, hemorrhage, hydrocephalus, extra-axial collection or mass lesion/mass effect. Stable mild atrophy and chronic microvascular ischemic changes. Vascular: Atherosclerotic vascular calcification of the carotid siphons. No hyperdense vessel. Skull: Normal. Negative for fracture or focal lesion. Sinuses/Orbits: No acute finding. Other: None. IMPRESSION: No acute intracranial abnormality. Electronically Signed   By: Titus Dubin M.D.   On: 05/03/2019 19:21   MR ANGIO HEAD WO CONTRAST  Result Date: 05/04/2019 CLINICAL DATA:  Left-sided weakness, resolved EXAM: MRI  HEAD WITHOUT CONTRAST MRA HEAD WITHOUT CONTRAST MRA NECK WITHOUT CONTRAST TECHNIQUE: Multiplanar, multiecho pulse sequences of the brain and surrounding structures were obtained without intravenous contrast. Angiographic images of the Circle of Willis were obtained using MRA technique without intravenous contrast. Angiographic images of the neck were obtained using MRA technique without intravenous contrast. Carotid stenosis measurements (when applicable) are obtained utilizing NASCET criteria, using the distal internal carotid diameter as the denominator. COMPARISON:  11/03/2018 FINDINGS: MRI HEAD FINDINGS Brain: There is no acute infarction or intracranial hemorrhage. There is no intracranial mass, mass effect, or edema. There is no hydrocephalus or extra-axial fluid collection. Patchy T2 hyperintensity in the supratentorial white matter is nonspecific but likely reflects mild to moderate chronic microvascular ischemic changes similar to the prior study. No abnormal enhancement. Vascular: Major vessel flow voids at the skull base are preserved. Skull  and upper cervical spine: Normal marrow signal is preserved. Sinuses/Orbits: Minor mucosal thickening.  Orbits are unremarkable. Other: Sella is unremarkable.  Mastoid air cells are clear. MRA HEAD FINDINGS Intracranial internal carotid arteries are patent. Middle and anterior cerebral arteries are patent. Intracranial vertebral arteries, basilar artery, posterior cerebral arteries are patent. Persistent moderate to severe right P1/P2 PCA stenosis. There is decreased visualization the distal right PCA. There is no aneurysm. MRA NECK FINDINGS Common, internal, and external arteries are patent. Extracranial vertebral arteries are patent. Vertebral artery origins are not well evaluated. IMPRESSION: No acute infarction, hemorrhage, or mass. Similar chronic microvascular ischemic changes. No hemodynamically significant stenosis neck. Similar moderate to severe proximal right PCA stenosis. There is decreased visualization of the more distal right PCA suggesting flow limitation. Electronically Signed   By: Macy Mis M.D.   On: 05/04/2019 13:27   MR ANGIO NECK W WO CONTRAST  Result Date: 05/04/2019 CLINICAL DATA:  Left-sided weakness, resolved EXAM: MRI HEAD WITHOUT CONTRAST MRA HEAD WITHOUT CONTRAST MRA NECK WITHOUT CONTRAST TECHNIQUE: Multiplanar, multiecho pulse sequences of the brain and surrounding structures were obtained without intravenous contrast. Angiographic images of the Circle of Willis were obtained using MRA technique without intravenous contrast. Angiographic images of the neck were obtained using MRA technique without intravenous contrast. Carotid stenosis measurements (when applicable) are obtained utilizing NASCET criteria, using the distal internal carotid diameter as the denominator. COMPARISON:  11/03/2018 FINDINGS: MRI HEAD FINDINGS Brain: There is no acute infarction or intracranial hemorrhage. There is no intracranial mass, mass effect, or edema. There is no hydrocephalus or extra-axial  fluid collection. Patchy T2 hyperintensity in the supratentorial white matter is nonspecific but likely reflects mild to moderate chronic microvascular ischemic changes similar to the prior study. No abnormal enhancement. Vascular: Major vessel flow voids at the skull base are preserved. Skull and upper cervical spine: Normal marrow signal is preserved. Sinuses/Orbits: Minor mucosal thickening.  Orbits are unremarkable. Other: Sella is unremarkable.  Mastoid air cells are clear. MRA HEAD FINDINGS Intracranial internal carotid arteries are patent. Middle and anterior cerebral arteries are patent. Intracranial vertebral arteries, basilar artery, posterior cerebral arteries are patent. Persistent moderate to severe right P1/P2 PCA stenosis. There is decreased visualization the distal right PCA. There is no aneurysm. MRA NECK FINDINGS Common, internal, and external arteries are patent. Extracranial vertebral arteries are patent. Vertebral artery origins are not well evaluated. IMPRESSION: No acute infarction, hemorrhage, or mass. Similar chronic microvascular ischemic changes. No hemodynamically significant stenosis neck. Similar moderate to severe proximal right PCA stenosis. There is decreased visualization of the more distal right PCA suggesting flow limitation. Electronically Signed  By: Macy Mis M.D.   On: 05/04/2019 13:27   MR BRAIN W WO CONTRAST  Result Date: 05/04/2019 CLINICAL DATA:  Left-sided weakness, resolved EXAM: MRI HEAD WITHOUT CONTRAST MRA HEAD WITHOUT CONTRAST MRA NECK WITHOUT CONTRAST TECHNIQUE: Multiplanar, multiecho pulse sequences of the brain and surrounding structures were obtained without intravenous contrast. Angiographic images of the Circle of Willis were obtained using MRA technique without intravenous contrast. Angiographic images of the neck were obtained using MRA technique without intravenous contrast. Carotid stenosis measurements (when applicable) are obtained utilizing  NASCET criteria, using the distal internal carotid diameter as the denominator. COMPARISON:  11/03/2018 FINDINGS: MRI HEAD FINDINGS Brain: There is no acute infarction or intracranial hemorrhage. There is no intracranial mass, mass effect, or edema. There is no hydrocephalus or extra-axial fluid collection. Patchy T2 hyperintensity in the supratentorial white matter is nonspecific but likely reflects mild to moderate chronic microvascular ischemic changes similar to the prior study. No abnormal enhancement. Vascular: Major vessel flow voids at the skull base are preserved. Skull and upper cervical spine: Normal marrow signal is preserved. Sinuses/Orbits: Minor mucosal thickening.  Orbits are unremarkable. Other: Sella is unremarkable.  Mastoid air cells are clear. MRA HEAD FINDINGS Intracranial internal carotid arteries are patent. Middle and anterior cerebral arteries are patent. Intracranial vertebral arteries, basilar artery, posterior cerebral arteries are patent. Persistent moderate to severe right P1/P2 PCA stenosis. There is decreased visualization the distal right PCA. There is no aneurysm. MRA NECK FINDINGS Common, internal, and external arteries are patent. Extracranial vertebral arteries are patent. Vertebral artery origins are not well evaluated. IMPRESSION: No acute infarction, hemorrhage, or mass. Similar chronic microvascular ischemic changes. No hemodynamically significant stenosis neck. Similar moderate to severe proximal right PCA stenosis. There is decreased visualization of the more distal right PCA suggesting flow limitation. Electronically Signed   By: Macy Mis M.D.   On: 05/04/2019 13:27   ECHOCARDIOGRAM COMPLETE BUBBLE STUDY  Result Date: 05/04/2019   ECHOCARDIOGRAM REPORT   Patient Name:   LATASHIA KOCH Date of Exam: 05/04/2019 Medical Rec #:  [id number]     Height:       66.0 in Accession #:    [id number]    Weight:       199.3 lb Date of Birth:  12-13-1945      BSA:          2.00 m  Patient Age:    74 years      BP:           122/62 mmHg Patient Gender: F             HR:           83 bpm. Exam Location:  [facility] Procedure: 2D Echo, Cardiac Doppler and Color Doppler Indications:    TIA  History:        Patient has prior history of Echocardiogram examinations, most                 recent 11/10/2018. Risk Factors:Hypertension, Diabetes and                 Dyslipidemia. Cerebrovascular accident (CVA).  Sonographer:    Alvino Chapel RCS Referring Phys: [id number] [place]  1. Left ventricular ejection fraction, by visual estimation, is 60 to 65%. The left ventricle has normal function. There is no left ventricular hypertrophy.  2. Left ventricular diastolic parameters are consistent with Grade I diastolic dysfunction (impaired relaxation).  3. The left ventricle  has no regional wall motion abnormalities.  4. Global right ventricle has normal systolic function.The right ventricular size is normal. No increase in right ventricular wall thickness.  5. Left atrial size was normal.  6. Right atrial size was normal.  7. Mild mitral annular calcification.  8. The mitral valve is grossly normal. No evidence of mitral valve regurgitation.  9. The tricuspid valve is grossly normal. 10. The tricuspid valve is grossly normal. Tricuspid valve regurgitation is trivial. 11. The aortic valve is tricuspid. Aortic valve regurgitation is not visualized. No evidence of aortic valve sclerosis or stenosis. 12. The pulmonic valve was grossly normal. Pulmonic valve regurgitation is trivial. 13. Normal pulmonary artery systolic pressure. 14. The inferior vena cava is normal in size with greater than 50% respiratory variability, suggesting right atrial pressure of 3 mmHg. 15. No PFO (bubble study performed). FINDINGS  Left Ventricle: Left ventricular ejection fraction, by visual estimation, is 60 to 65%. The left ventricle has normal function. The left ventricle has no regional wall motion abnormalities.  The left ventricular internal cavity size was the left ventricle is normal in size. There is no left ventricular hypertrophy. Left ventricular diastolic parameters are consistent with Grade I diastolic dysfunction (impaired relaxation). Indeterminate filling pressures. Right Ventricle: The right ventricular size is normal. No increase in right ventricular wall thickness. Global RV systolic function is has normal systolic function. The tricuspid regurgitant velocity is 1.80 m/s, and with an assumed right atrial pressure  of 3 mmHg, the estimated right ventricular systolic pressure is normal at 16.0 mmHg. Left Atrium: Left atrial size was normal in size. Right Atrium: Right atrial size was normal in size Pericardium: There is no evidence of pericardial effusion. Mitral Valve: The mitral valve is grossly normal. Mild mitral annular calcification. No evidence of mitral valve regurgitation. Tricuspid Valve: The tricuspid valve is grossly normal. Tricuspid valve regurgitation is trivial. Aortic Valve: The aortic valve is tricuspid. Aortic valve regurgitation is not visualized. The aortic valve is structurally normal, with no evidence of sclerosis or stenosis. Pulmonic Valve: The pulmonic valve was grossly normal. Pulmonic valve regurgitation is trivial. Pulmonic regurgitation is trivial. Aorta: The aortic root is normal in size and structure. Venous: The inferior vena cava is normal in size with greater than 50% respiratory variability, suggesting right atrial pressure of 3 mmHg. IAS/Shunts: No atrial level shunt detected by color flow Doppler. Agitated saline contrast was given intravenously to evaluate for intracardiac shunting.  LEFT VENTRICLE PLAX 2D LVIDd:         3.99 cm  Diastology LVIDs:         2.04 cm  LV e' lateral:   7.29 cm/s LV PW:         0.98 cm  LV E/e' lateral: 9.3 LV IVS:        0.92 cm  LV e' medial:    5.33 cm/s LVOT diam:     1.70 cm  LV E/e' medial:  12.7 LV SV:         56 ml LV SV Index:   26.98  LVOT Area:     2.27 cm  RIGHT VENTRICLE RV S prime:     10.80 cm/s TAPSE (M-mode): 2.1 cm LEFT ATRIUM           Index       RIGHT ATRIUM           Index LA diam:      2.65 cm 1.33 cm/m  RA Area:  12.30 cm LA Vol (A2C): 18.9 ml 9.47 ml/m  RA Volume:   27.40 ml  13.72 ml/m LA Vol (A4C): 32.0 ml 16.03 ml/m  AORTIC VALVE LVOT Vmax:   122.00 cm/s LVOT Vmean:  90.000 cm/s LVOT VTI:    0.262 m  AORTA Ao Root diam: 3.05 cm MITRAL VALVE                         TRICUSPID VALVE MV Area (PHT): 2.16 cm              TR Peak grad:   13.0 mmHg MV PHT:        102.08 msec           TR Vmax:        180.00 cm/s MV Decel Time: 352 msec MV E velocity: 67.70 cm/s  103 cm/s  SHUNTS MV A velocity: 105.00 cm/s 70.3 cm/s Systemic VTI:  0.26 m MV E/A ratio:  0.64        1.5       Systemic Diam: 1.70 cm  Kate Sable MD Electronically signed by Kate Sable MD Signature Date/Time: 05/04/2019/12:24:26 PM    Final       Discharge Exam: Vitals:   05/04/19 1100 05/04/19 1250  BP: 122/64 112/60  Pulse: 81 91  Resp: 16 20  Temp: 98.4 F (36.9 C) 98.8 F (37.1 C)  SpO2: 98% 98%    GENERAL: No acute distress.  Appears well.  HEENT: MMM.  Vision and hearing grossly intact.  NECK: Supple.  No apparent JVD.  RESP:  No IWOB. Good air movement bilaterally. CVS:  RRR. Heart sounds normal.  ABD/GI/GU: Bowel sounds present. Soft. Non tender.  MSK/EXT:  Moves extremities. No apparent deformity or edema.  SKIN: no apparent skin lesion or wound NEURO: Awake, alert and oriented appropriately.  No apparent focal neuro deficit. PSYCH: Calm. Normal affect.   The results of significant diagnostics from this hospitalization (including imaging, microbiology, ancillary and laboratory) are listed below for reference.     Microbiology: Recent Results (from the past 240 hour(s))  SARS CORONAVIRUS 2 (TAT 6-24 HRS) Nasopharyngeal Nasopharyngeal Swab     Status: None   Collection Time: 05/03/19  9:34 PM   Specimen:  Nasopharyngeal Swab  Result Value Ref Range Status   SARS Coronavirus 2 NEGATIVE NEGATIVE Final    Comment: (NOTE) SARS-CoV-2 target nucleic acids are NOT DETECTED. The SARS-CoV-2 RNA is generally detectable in upper and lower respiratory specimens during the acute phase of infection. Negative results do not preclude SARS-CoV-2 infection, do not rule out co-infections with other pathogens, and should not be used as the sole basis for treatment or other patient management decisions. Negative results must be combined with clinical observations, patient history, and epidemiological information. The expected result is Negative. Fact Sheet for Patients: SugarRoll.be Fact Sheet for Healthcare Providers: https://www.woods-mathews.com/ This test is not yet approved or cleared by the [place] FDA and  has been authorized for detection and/or diagnosis of SARS-CoV-2 by FDA under an Emergency Use Authorization (EUA). This EUA will remain  in effect (meaning this test can be used) for the duration of the COVID-19 declaration under Section 56 4(b)(1) of the Act, 21 U.S.C. section 360bbb-3(b)(1), unless the authorization is terminated or revoked sooner. Performed at [facility], [place] [address], [place], [place] [address]      Labs: BNP (last 3 results) No results for input(s): BNP in the last  8760 hours. Basic Metabolic Panel: Recent Labs  Lab 05/03/19 1745 05/04/19 0550  NA 135 135  K 3.8 3.9  CL 98 99  CO2 27 25  GLUCOSE 136* 146*  BUN 11 13  CREATININE 0.69 0.60  CALCIUM 9.2 8.9  MG 2.1 2.1   Liver Function Tests: Recent Labs  Lab 05/04/19 0550  AST 24  ALT 28  ALKPHOS 59  BILITOT 0.5  PROT 6.6  ALBUMIN 3.6   No results for input(s): LIPASE, AMYLASE in the last 168 hours. No results for input(s): AMMONIA in the last 168 hours. CBC: Recent Labs  Lab 05/03/19 1745 05/04/19 0550  WBC 8.3 7.4  HGB 13.0 12.3  HCT  41.7 39.1  MCV 87.6 87.7  PLT 240 239   Cardiac Enzymes: No results for input(s): CKTOTAL, CKMB, CKMBINDEX, TROPONINI in the last 168 hours. BNP: Invalid input(s): POCBNP CBG: Recent Labs  Lab 05/04/19 0113 05/04/19 0737 05/04/19 1126  GLUCAP 161* 114* 111*   D-Dimer No results for input(s): DDIMER in the last 72 hours. Hgb A1c Recent Labs    05/04/19 0550  HGBA1C 7.1*   Lipid Profile Recent Labs    05/04/19 0550  CHOL 137  HDL 36*  LDLCALC 74  TRIG 137  CHOLHDL 3.8   Thyroid function studies No results for input(s): TSH, T4TOTAL, T3FREE, THYROIDAB in the last 72 hours.  Invalid input(s): FREET3 Anemia work up No results for input(s): VITAMINB12, FOLATE, FERRITIN, TIBC, IRON, RETICCTPCT in the last 72 hours. Urinalysis    Component Value Date/Time   COLORURINE YELLOW 01/04/2008 1005   APPEARANCEUR Clear 10/06/2018 0813   LABSPEC 1.018 01/04/2008 1005   PHURINE 6.5 01/04/2008 1005   GLUCOSEU Negative 10/06/2018 0813   HGBUR NEGATIVE 01/04/2008 1005   BILIRUBINUR Negative 10/06/2018 0813   KETONESUR NEGATIVE 01/04/2008 1005   PROTEINUR Negative 10/06/2018 0813   PROTEINUR NEGATIVE 01/04/2008 1005   UROBILINOGEN negative 08/18/2012 0914   UROBILINOGEN 0.2 01/04/2008 1005   NITRITE Negative 10/06/2018 0813   NITRITE NEGATIVE 01/04/2008 1005   LEUKOCYTESUR Trace (A) 10/06/2018 0813   Sepsis Labs Invalid input(s): PROCALCITONIN,  WBC,  LACTICIDVEN   Time coordinating discharge: 40 minutes  SIGNED:  Mercy Riding, MD  [facility] 05/04/2019, 4:47 PM  If 7PM-7AM, please contact night-coverage [email or web address] Password [email or web address]

## 2019-05-04 NOTE — Progress Notes (Signed)
Pt reported 5/10 upper left chest pain that radiates to her back. Current vitals WNL, MD notified of current change. MD ordered maalox/mylanta and nitro prn for future episodes, as pain was gone when MD called back. Pt stated she had a very small bm on yesterday, but has been dealing with constipation for several days.

## 2019-05-05 ENCOUNTER — Telehealth: Payer: Self-pay | Admitting: Family Medicine

## 2019-05-05 ENCOUNTER — Encounter: Payer: Self-pay | Admitting: *Deleted

## 2019-05-05 NOTE — Progress Notes (Unsigned)
    Transitional Care Management  Contact Attempt Attempt Date:05/05/2019 Attempted By:   1st unsuccessful TCM contact attempt.   I reached out to [name] on her preferred telephone number to discuss Transitional Care Management, medication reconciliation, and to schedule a TCM hospital follow-up with her PCP at [facility].  Discharge Date: 05/04/19 Location: [facility]) Discharge Dx: Left-sided paresthesia, Atypical chest pain  Recommendations for Outpatient Follow-up:    Follow-up Information    Dettinger, Fransisca Kaufmann, MD. Schedule an appointment as soon as possible for a visit in 2 week(s).   Specialties: Family Medicine, Cardiology Contact information: [place] [address] [phone]        Marcial Pacas, MD. Schedule an appointment as soon as possible for a visit in 3 week(s).   Specialty: Neurology Contact information: [place] [address] [phone]       Plan I left a HIPPA compliant message for her to return my call.  Will attempt to contact again within the 2 business day post discharge window if she does not return my call.   Cleda Clarks Damyon Mullane, LPN

## 2019-05-05 NOTE — Telephone Encounter (Signed)
Appointment scheduled for 02/11 with Dettinger at 1:40pm.

## 2019-05-11 DIAGNOSIS — Z96653 Presence of artificial knee joint, bilateral: Secondary | ICD-10-CM | POA: Diagnosis not present

## 2019-05-11 DIAGNOSIS — Z471 Aftercare following joint replacement surgery: Secondary | ICD-10-CM | POA: Diagnosis not present

## 2019-05-17 ENCOUNTER — Ambulatory Visit: Payer: Medicare Other | Attending: Internal Medicine

## 2019-05-17 ENCOUNTER — Other Ambulatory Visit: Payer: Self-pay

## 2019-05-17 DIAGNOSIS — Z23 Encounter for immunization: Secondary | ICD-10-CM | POA: Insufficient documentation

## 2019-05-17 NOTE — Progress Notes (Signed)
   Covid-19 Vaccination Clinic  Name:  [name]    MRN: [id number] DOB: 05-Aug-1945  05/17/2019   [name] was observed post Covid-19 immunization for 15 minutes without incidence. She was provided with Vaccine Information Sheet and instruction to access the V-Safe system.   [name] was instructed to call 911 with any severe reactions post vaccine: Marland Kitchen Difficulty breathing  . Swelling of your face and throat  . A fast heartbeat  . A bad rash all over your body  . Dizziness and weakness    Immunizations Administered    Name Date Dose VIS Date Route   Pfizer COVID-19 Vaccine 05/17/2019  4:45 PM 0.3 mL 03/17/2019 Intramuscular   Manufacturer: [place]   Lot: [id number]   [place]: [id number]

## 2019-05-18 ENCOUNTER — Encounter: Payer: Self-pay | Admitting: Family Medicine

## 2019-05-18 ENCOUNTER — Ambulatory Visit (INDEPENDENT_AMBULATORY_CARE_PROVIDER_SITE_OTHER): Payer: Medicare Other | Admitting: Family Medicine

## 2019-05-18 VITALS — BP 127/72 | HR 84 | Temp 97.7°F | Ht 66.0 in | Wt 202.6 lb

## 2019-05-18 DIAGNOSIS — G459 Transient cerebral ischemic attack, unspecified: Secondary | ICD-10-CM | POA: Diagnosis not present

## 2019-05-18 LAB — BAYER DCA HB A1C WAIVED: HB A1C (BAYER DCA - WAIVED): 7.5 % — ABNORMAL HIGH (ref <7.0)

## 2019-05-18 NOTE — Progress Notes (Signed)
BP 127/72   Pulse 84   Temp 97.7 F (36.5 C) (Temporal)   Ht 5' 6" (1.676 m)   Wt 202 lb 9.6 oz (91.9 kg)   SpO2 98%   BMI 32.70 kg/m    Subjective:   [name], female    DOB: [birth year], 74 y.o.   MRN: [id number]  HPI: [name] is a 74 y.o. female presenting on 05/18/2019 for Hospitalization Follow-up (1/27 AP)   HPI Patient comes in with multiple episodes of numbness that usually last 1 to 2 min where she gets numbness on the left side of her face and left arm and left leg.  This is happened six times since she left the hospital.  She was in the hospital on 05/03/2019.  Patient denies any weakness or blurred vision or any other symptoms associated with it.  On the episodes that have happened they have been less frequent and it is only been her left arm and left face instead of all the way down her leg since she left the hospital.  She denies any lightheadedness or dizziness.  Relevant past medical, surgical, family and social history reviewed and updated as indicated. Interim medical history since our last visit reviewed. Allergies and medications reviewed and updated.  Review of Systems  Constitutional: Negative for chills and fever.  Eyes: Negative for visual disturbance.  Respiratory: Negative for chest tightness and shortness of breath.   Cardiovascular: Negative for chest pain and leg swelling.  Musculoskeletal: Negative for back pain and gait problem.  Skin: Negative for rash.  Neurological: Positive for numbness. Negative for dizziness, weakness, light-headedness and headaches.  Psychiatric/Behavioral: Negative for agitation and behavioral problems.  All other systems reviewed and are negative.   Per HPI unless specifically indicated above   Allergies as of 05/18/2019      Reactions   Penicillins Other (See Comments)   Blisters in mouth Did it involve swelling of the face/tongue/throat, SOB, or low BP? No Did it involve sudden or severe  rash/hives, skin peeling, or any reaction on the inside of your mouth or nose? No Did you need to seek medical attention at a hospital or doctor's office? No When did it last happen?Childhood allergy If all above answers are "NO", may proceed with cephalosporin use.   Codeine Nausea And Vomiting   Erythromycin Nausea And Vomiting   Fenofibrate Other (See Comments)   aching & edema    Lyrica [pregabalin] Other (See Comments)   Edema in feet   Statins Other (See Comments)   Joints ache   Sulfa Antibiotics Nausea And Vomiting      Medication List       Accurate as of May 18, 2019  1:47 PM. If you have any questions, ask your nurse or doctor.        ALPRAZolam 0.5 MG tablet Commonly known as: XANAX Take 1 tablet (0.5 mg total) by mouth 2 (two) times daily as needed for anxiety. Take 1-2 tabs daily as needed   ARIPiprazole 2 MG tablet Commonly known as: ABILIFY Take 1 tablet (2 mg total) by mouth daily.   aspirin EC 81 MG tablet Take 81 mg by mouth daily.   blood glucose meter kit and supplies Kit Dispense based on patient and insurance preference. Use up to two times daily as directed. (Dx: type 2 DM - E11.9)   clopidogrel 75 MG tablet Commonly known as: Plavix Take 1 tablet (75 mg total) by mouth daily.  escitalopram 20 MG tablet Commonly known as: LEXAPRO Take 1 tablet (20 mg total) by mouth daily.   ezetimibe 10 MG tablet Commonly known as: ZETIA Take 1 tablet (10 mg total) by mouth daily.   Fish Oil 1000 MG Caps Take by mouth. Take 2 pills daily   gabapentin 600 MG tablet Commonly known as: NEURONTIN Take 1 tablet (600 mg total) by mouth 4 (four) times daily.   glucose blood test strip Commonly known as: ONE TOUCH ULTRA TEST CHECK BLOOD SUGAR UP TO 2 TIMES A DAY   HYDROcodone-acetaminophen 5-325 MG tablet Commonly known as: NORCO/VICODIN Take 1 tablet by mouth every 6 (six) hours as needed for moderate pain.   lisinopril-hydrochlorothiazide  20-25 MG tablet Commonly known as: ZESTORETIC Take 1 tablet by mouth daily.   Livalo 2 MG Tabs Generic drug: Pitavastatin Calcium Take 1 tablet (2 mg total) by mouth every other day.   LUTEIN 20 PO Take by mouth daily.   metFORMIN 500 MG 24 hr tablet Commonly known as: GLUCOPHAGE-XR Take 2 tablets (1,000 mg total) by mouth daily with breakfast.   OneTouch Delica Lancets 37C Misc CHECK BLOOD SUGAR UP TO TWICE DAILY   OneTouch Verio w/Device Kit Test BS BID Dx E11.9   Ozempic (1 MG/DOSE) 2 MG/1.5ML Sopn Generic drug: Semaglutide (1 MG/DOSE) Inject 1 mg into the skin once a week.   pantoprazole 40 MG tablet Commonly known as: Protonix Take 1 tablet (40 mg total) by mouth daily.   Vitamin D 50 MCG (2000 UT) tablet Take 2,000 Units by mouth daily.        Objective:   BP 127/72   Pulse 84   Temp 97.7 F (36.5 C) (Temporal)   Ht 5' 6" (1.676 m)   Wt 202 lb 9.6 oz (91.9 kg)   SpO2 98%   BMI 32.70 kg/m   Wt Readings from Last 3 Encounters:  05/18/19 202 lb 9.6 oz (91.9 kg)  05/04/19 199 lb 4.7 oz (90.4 kg)  11/16/18 203 lb (92.1 kg)    Physical Exam Vitals and nursing note reviewed.  Constitutional:      General: She is not in acute distress.    Appearance: She is well-developed. She is not diaphoretic.  Eyes:     Conjunctiva/sclera: Conjunctivae normal.  Cardiovascular:     Rate and Rhythm: Normal rate and regular rhythm.     Heart sounds: Normal heart sounds. No murmur.  Pulmonary:     Effort: Pulmonary effort is normal. No respiratory distress.     Breath sounds: Normal breath sounds. No wheezing.  Musculoskeletal:        General: No tenderness. Normal range of motion.  Skin:    General: Skin is warm and dry.     Findings: No rash.  Neurological:     Mental Status: She is alert and oriented to person, place, and time.     Coordination: Coordination normal.  Psychiatric:        Behavior: Behavior normal.       Assessment & Plan:   Problem  List Items Addressed This Visit      Cardiovascular and Mediastinum   TIA (transient ischemic attack) - Primary   Relevant Orders   CBC with Differential/Platelet   CMP14+EGFR   Bayer DCA Hb A1c Waived   TSH      Sounds like she is having multiple TIAs although she said they have been less frequent for the past couple days, she had six and she left  and has follow-up with urology, on Plavix currently, instructed her on what symptoms to watch out for and when to go to the emergency department again and to give [place] a call if she has any questions  Patient has neurology appointment next week Follow up plan: Return in about 4 weeks (around 06/15/2019), or if symptoms worsen or fail to improve, for TIA.  Counseling provided for all of the vaccine components No orders of the defined types were placed in this encounter.   Caryl Pina, MD [facility] 05/18/2019, 1:47 PM

## 2019-05-19 LAB — CBC WITH DIFFERENTIAL/PLATELET
Basophils Absolute: 0.1 10*3/uL (ref 0.0–0.2)
Basos: 1 %
EOS (ABSOLUTE): 0.3 10*3/uL (ref 0.0–0.4)
Eos: 3 %
Hematocrit: 39 % (ref 34.0–46.6)
Hemoglobin: 12.8 g/dL (ref 11.1–15.9)
Immature Grans (Abs): 0 10*3/uL (ref 0.0–0.1)
Immature Granulocytes: 0 %
Lymphocytes Absolute: 2.1 10*3/uL (ref 0.7–3.1)
Lymphs: 23 %
MCH: 27.5 pg (ref 26.6–33.0)
MCHC: 32.8 g/dL (ref 31.5–35.7)
MCV: 84 fL (ref 79–97)
Monocytes Absolute: 0.8 10*3/uL (ref 0.1–0.9)
Monocytes: 9 %
Neutrophils Absolute: 5.9 10*3/uL (ref 1.4–7.0)
Neutrophils: 64 %
Platelets: 250 10*3/uL (ref 150–450)
RBC: 4.65 x10E6/uL (ref 3.77–5.28)
RDW: 14.6 % (ref 11.7–15.4)
WBC: 9.2 10*3/uL (ref 3.4–10.8)

## 2019-05-19 LAB — CMP14+EGFR
ALT: 34 IU/L — ABNORMAL HIGH (ref 0–32)
AST: 31 IU/L (ref 0–40)
Albumin/Globulin Ratio: 1.7 (ref 1.2–2.2)
Albumin: 4.2 g/dL (ref 3.7–4.7)
Alkaline Phosphatase: 80 IU/L (ref 39–117)
BUN/Creatinine Ratio: 13 (ref 12–28)
BUN: 10 mg/dL (ref 8–27)
Bilirubin Total: 0.3 mg/dL (ref 0.0–1.2)
CO2: 22 mmol/L (ref 20–29)
Calcium: 9.5 mg/dL (ref 8.7–10.3)
Chloride: 94 mmol/L — ABNORMAL LOW (ref 96–106)
Creatinine, Ser: 0.79 mg/dL (ref 0.57–1.00)
GFR calc Af Amer: 86 mL/min/{1.73_m2} (ref >59)
GFR calc non Af Amer: 74 mL/min/{1.73_m2} (ref >59)
Globulin, Total: 2.5 g/dL (ref 1.5–4.5)
Glucose: 94 mg/dL (ref 65–99)
Potassium: 4.5 mmol/L (ref 3.5–5.2)
Sodium: 136 mmol/L (ref 134–144)
Total Protein: 6.7 g/dL (ref 6.0–8.5)

## 2019-05-19 LAB — TSH: TSH: 1.73 u[IU]/mL (ref 0.450–4.500)

## 2019-05-30 ENCOUNTER — Other Ambulatory Visit: Payer: Self-pay | Admitting: *Deleted

## 2019-05-30 MED ORDER — CLOPIDOGREL BISULFATE 75 MG PO TABS: 75.0000 mg | ORAL_TABLET | Freq: Every day | ORAL | 2 refills | Status: AC

## 2019-06-14 ENCOUNTER — Ambulatory Visit: Payer: Medicare Other | Admitting: Neurology

## 2019-06-14 ENCOUNTER — Encounter: Payer: Self-pay | Admitting: Neurology

## 2019-06-14 ENCOUNTER — Other Ambulatory Visit: Payer: Self-pay

## 2019-06-14 VITALS — BP 123/71 | HR 86 | Temp 97.5°F | Ht 66.0 in | Wt 210.0 lb

## 2019-06-14 DIAGNOSIS — R202 Paresthesia of skin: Secondary | ICD-10-CM | POA: Diagnosis not present

## 2019-06-14 DIAGNOSIS — E1143 Type 2 diabetes mellitus with diabetic autonomic (poly)neuropathy: Secondary | ICD-10-CM | POA: Diagnosis not present

## 2019-06-14 NOTE — Progress Notes (Signed)
PATIENT: [name] DOB: 25-Jan-1946  Chief Complaint  Patient presents with  . Transient Ischemic Attack    She is here for hospital follow up of TIA events. She was seen in ED on 74/27/21 for chest pain and reported episodes of left-sided numbness. She was started on Plavix 40m, one tablet daily. She is also taking aspirin 853m one tablet daily.     HISTORICAL  Mary KALLSTROMs a 74 year old female, accompanied by her husband, seen in request by her primary care physician Dr. DeWarrick Parisianor evaluation of acute onset of unsteady gait, peripheral neuropathy, left hand tremor, initial evaluation was on November 03, 2018  I have reviewed and summarized the referring note from the referring physician.  She has past medical history of diabetes mellitus, hypertension, hyperlipidemia, depression anxiety on polypharmacy treatment, but not on antiplatelet agent.  She was diagnosed with Purtscher's retinopathy of both eye, [facility] Dr.Weinstein in March 2020, she was treated with short course of steroid, symptoms has much improved, but has mild bilateral blurry vision  On October 31, 2018, while driving, she noticed sudden onset of dizziness, described as surrounding was moving, her body lean towards the right side, when she stepped out of the car, she noticed unsteady gait, leaning towards her right side, symptoms has been persistent since onset, with mild improvement, still has unsteady gait, walks like a drunk, she denies significant vertigo, no hearing loss no lateralized motor or sensory deficit  She also has symptoms of neuropathy since 2015, starting with bilateral plantar feet numbness, tingling, gradually getting worse, now to top of her foot, she denies significant gait abnormality, mild low back pain, no radiating pain to bilateral lower extremity, no incontinence, no upper extremity paresthesia.  She also noticed left hand  intermittent tremor since 2020, most noticeable when holding pen, no resting tremor, no loss sense of smell.  She has difficulty sleeping, frequent awakening at nighttime, but did not notice REM sleep disorder  UPDATE Jan 09 2019: She continues to complains intermittent bilateral feet paresthesia, no longer has dizziness  EMG nerve conduction study today showed mild axonal sensorimotor polyneuropathy.  There is no evidence of bilateral lumbar radiculopathy.  I have personally reviewed MRI of the brain without contrast November 03, 2018: There was no acute abnormality.  Moderate small vessel disease, progressed from 2007.  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, she is on aspirin 81 mg daily  Echocardiogram in August 2020: Ejection fraction more than 65%  UPDATE June 14 2019: She was admitted to the hospital on May 03, 2019 for intermittent episode of left side numbness, involving left face, and the whole body on the left side, this happened on January 25, the numbness lasted about 3 minutes, she denied weakness, but since the initial episode, she continued to have transient intermittent numbness involving left face, left arm,  I personally reviewed hospital extensive evaluations, MRI of the brain with without contrast on January 28 showed no acute stroke, mild supratentorium small vessel disease.  MRI of the brain showed moderate to severe proximal right PCA stenosis, MRA of the neck showed no significant large vessel disease  Laboratory evaluation showed normal TSH, A1c 7.5, normal CBC, LDL 74,  Echocardiogram showed ejection fraction 60 to 65%, no wall motion abnormality She was discharged with Plavix plus aspirin  She also complains of right knee pain, low back pain, mild gait abnormality, continue  combating depression, anxiety, is very frustrated about her declining function, on polypharmacy treatment, Xanax as needed, and Abilify, Lexapro, gabapentin, she is under pain  management Dr. Nelva Bush, taking hydrocodone  REVIEW OF SYSTEMS: Full 14 system review of systems performed and notable only for as above All other review of systems were negative.  ALLERGIES: Allergies  Allergen Reactions  . Penicillins Other (See Comments)    Blisters in mouth Did it involve swelling of the face/tongue/throat, SOB, or low BP? No Did it involve sudden or severe rash/hives, skin peeling, or any reaction on the inside of your mouth or nose? No Did you need to seek medical attention at a hospital or doctor's office? No When did it last happen?Childhood allergy If all above answers are "NO", may proceed with cephalosporin use.   . Codeine Nausea And Vomiting  . Erythromycin Nausea And Vomiting  . Fenofibrate Other (See Comments)    aching & edema    . Lyrica [Pregabalin] Other (See Comments)    Edema in feet   . Statins Other (See Comments)    Joints ache  . Sulfa Antibiotics Nausea And Vomiting    HOME MEDICATIONS: Current Outpatient Medications  Medication Sig Dispense Refill  . ALPRAZolam (XANAX) 0.5 MG tablet Take 1 tablet (0.5 mg total) by mouth 2 (two) times daily as needed for anxiety. Take 1-2 tabs daily as needed 45 tablet 2  . ARIPiprazole (ABILIFY) 2 MG tablet Take 1 tablet (2 mg total) by mouth daily. 90 tablet 3  . aspirin EC 81 MG tablet Take 81 mg by mouth daily.    . blood glucose meter kit and supplies KIT Dispense based on patient and insurance preference. Use up to two times daily as directed. (Dx: type 2 DM - E11.9) 1 each 0  . Blood Glucose Monitoring Suppl (ONETOUCH VERIO) w/Device KIT Test BS BID Dx E11.9 1 kit 0  . Cholecalciferol (VITAMIN D) 50 MCG (2000 UT) tablet Take 2,000 Units by mouth daily.     . clopidogrel (PLAVIX) 75 MG tablet Take 1 tablet (75 mg total) by mouth daily. 30 tablet 2  . escitalopram (LEXAPRO) 20 MG tablet Take 1 tablet (20 mg total) by mouth daily. 90 tablet 3  . ezetimibe (ZETIA) 10 MG tablet Take 1 tablet (10  mg total) by mouth daily. 90 tablet 3  . gabapentin (NEURONTIN) 600 MG tablet Take 1 tablet (600 mg total) by mouth 4 (four) times daily. 360 tablet 3  . glucose blood (ONE TOUCH ULTRA TEST) test strip CHECK BLOOD SUGAR UP TO 2 TIMES A DAY 200 each 3  . HYDROcodone-acetaminophen (NORCO/VICODIN) 5-325 MG tablet Take 1 tablet by mouth every 6 (six) hours as needed for moderate pain.    Marland Kitchen lisinopril-hydrochlorothiazide (ZESTORETIC) 20-25 MG tablet Take 1 tablet by mouth daily. 90 tablet 3  . metFORMIN (GLUCOPHAGE-XR) 500 MG 24 hr tablet Take 2 tablets (1,000 mg total) by mouth daily with breakfast. 180 tablet 3  . Misc Natural Products (LUTEIN 20 PO) Take by mouth daily.    . nicotine polacrilex (NICORELIEF) 2 MG gum Take 2 mg by mouth daily.    . Omega-3 Fatty Acids (FISH OIL) 1000 MG CAPS Take by mouth. Take 2 pills daily    . ONETOUCH DELICA LANCETS 24Q MISC CHECK BLOOD SUGAR UP TO TWICE DAILY 200 each 3  . pantoprazole (PROTONIX) 40 MG tablet Take 1 tablet (40 mg total) by mouth daily. 90 tablet 0  . Pitavastatin Calcium (LIVALO) 2 MG  TABS Take 1 tablet (2 mg total) by mouth every other day. 45 tablet 3  . Semaglutide, 1 MG/DOSE, (OZEMPIC, 1 MG/DOSE,) 2 MG/1.5ML SOPN Inject 1 mg into the skin once a week. 3 mL 3   No current facility-administered medications for this visit.    PAST MEDICAL HISTORY: Past Medical History:  Diagnosis Date  . Allergy    seasonal  . Arthritis    [place]   . Colon polyps   . Complication of anesthesia    per pt, she woke up in the middle of last colon in 2014.  . Depression   . Diabetes mellitus, type 2 ([place]) 06/2010  . Diabetic neuropathy ([place])   . Diverticulosis    pt unaware  . Esophageal stricture   . Fibromyalgia    [place]   . GERD (gastroesophageal reflux disease)   . Hemorrhoids   . Hiatal hernia   . Hyperlipidemia   . Hypertension   . Menopause   . Numbness   . Peripheral vascular insufficiency ([place])   . Retinopathy    Bilateral    . TIA (transient ischemic attack)   . Tremor   . Vitamin D deficiency     PAST SURGICAL HISTORY: Past Surgical History:  Procedure Laterality Date  . ABDOMINAL HYSTERECTOMY     partial and then total  . ANKLE SURGERY Right   . COLONOSCOPY    . ELBOW SURGERY     left  . KNEE ARTHROSCOPY Right 09/12/2018   Procedure: ARTHROSCOPY KNEE; SYNOVECTOMY;  Surgeon: Gaynelle Arabian, MD;  Location: [facility];  Service: Orthopedics;  Laterality: Right;  72mn  . REFRACTIVE SURGERY  2003  . TOTAL KNEE ARTHROPLASTY     bilateral  . TOTAL KNEE REVISION Right 03/09/2018   Procedure: RIGHT TOTAL KNEE REVISION;  Surgeon: AGaynelle Arabian MD;  Location: [facility];  Service: Orthopedics;  Laterality: Right;  1248m with abductor block  . VESICOVAGINAL FISTULA CLOSURE W/ TAH  1981    FAMILY HISTORY: Family History  Problem Relation Age of Onset  . Diabetes Mother   . Heart failure Mother   . Hypertension Mother   . Cirrhosis Father   . Cirrhosis Brother     SOCIAL HISTORY: Social History   Socioeconomic History  . Marital status: Married    Spouse name: DaKasandra Knudsen. Number of children: 2  . Years of education: two years of college  . Highest education level: Some college, no degree  Occupational History  . Occupation: Retired  Tobacco Use  . Smoking status: Former Smoker    Packs/day: 1.00    Years: 10.00    Pack years: 10.00    Types: Cigarettes    Start date: 11/07/1975    Quit date: 06/24/2010    Years since quitting: 8.9  . Smokeless tobacco: Never Used  Substance and Sexual Activity  . Alcohol use: Not Currently  . Drug use: No  . Sexual activity: Yes    Birth control/protection: Surgical  Other Topics Concern  . Not on file  Social History Narrative   Lives at home with her husband.   Left-handed.   One can of Diet Coke per day.   Social Determinants of Health   Financial Resource Strain: Low Risk   . Difficulty of Paying Living Expenses: Not hard at all  Food Insecurity: No  Food Insecurity  . Worried About RuCharity fundraisern the Last Year: Never true  . Ran Out of Food in the Last Year:  Never true  Transportation Needs: No Transportation Needs  . Lack of Transportation (Medical): No  . Lack of Transportation (Non-Medical): No  Physical Activity: Inactive  . Days of Exercise per Week: 0 days  . Minutes of Exercise per Session: 0 min  Stress: No Stress Concern Present  . Feeling of Stress : Not at all  Social Connections: Not Isolated  . Frequency of Communication with Friends and Family: More than three times a week  . Frequency of Social Gatherings with Friends and Family: More than three times a week  . Attends Religious Services: More than 4 times per year  . Active Member of Clubs or Organizations: Yes  . Attends Archivist Meetings: More than 4 times per year  . Marital Status: Married  Human resources officer Violence: Not At Risk  . Fear of Current or Ex-Partner: No  . Emotionally Abused: No  . Physically Abused: No  . Sexually Abused: No     PHYSICAL EXAM   Vitals:   06/14/19 0726  BP: 123/71  Pulse: 86  Temp: (!) 97.5 F (36.4 C)  Weight: 210 lb (95.3 kg)  Height: 5' 6"  (1.676 m)    Not recorded      Body mass index is 33.89 kg/m.  PHYSICAL EXAMNIATION:  Gen: NAD, conversant, well nourised, obese, well groomed                     Cardiovascular: Regular rate rhythm, no peripheral edema, warm, nontender. Eyes: Conjunctivae clear without exudates or hemorrhage Neck: Supple, no carotid bruits. Pulmonary: Clear to auscultation bilaterally   NEUROLOGICAL EXAM:  MENTAL STATUS: Speech:    Speech is normal; fluent and spontaneous with normal comprehension.  Cognition:     Orientation to time, place and person     Normal recent and remote memory     Normal Attention span and concentration     Normal Language, naming, repeating,spontaneous speech     Fund of knowledge   CRANIAL NERVES: CN II: Visual fields are full  to confrontation.  Pupils are round equal and briskly reactive to light. CN III, IV, VI: extraocular movement are normal. No ptosis.  CN V: Facial sensation is intact to pinprick in all 3 divisions bilaterally. Corneal responses are intact.  CN VII: Face is symmetric   CN VIII: Hearing is normal to casual conversation CN IX, X:  Phonation is normal. CN XI: Head turning and shoulder shrug are intact   MOTOR: There is no pronator drift of out-stretched arms. Muscle bulk and tone are normal. Muscle strength is normal.  REFLEXES: Reflexes are 1 and symmetric at the biceps, triceps, absent at knees, and ankles. Plantar responses are flexor.  SENSORY: Length dependent decreased to light touch, pinprick to ankle level, preserved to toe vibratory sensation.  COORDINATION: There was no finger-to-nose, heel-to-shin dysmetria or truncal ataxia  GAIT/STANCE: She needs push-up to get up from seated position, dragging her right leg some,  DIAGNOSTIC DATA (LABS, IMAGING, TESTING) - I reviewed patient records, labs, notes, testing and imaging myself where available.   ASSESSMENT AND PLAN  [name] is a 74 y.o. female   Acute onset of intermittent left side paresthesia on May 01, 2019,  MRI of the brain showed no acute abnormality, small vessel disease, MRA of the brain showed severe right PCA stenosis, MRA of the neck showed no large vessel disease  I have advised her single antiplatelet agent Plavix 75 mg only, stop aspirin 81  mg  Peripheral neuropathy  Most consistent with her long history of diabetes, which is under suboptimal control with A1c of 7.5.  EMG nerve conduction study confirmed mild length dependent axonal sensorimotor polyneuropathy,   Marcial Pacas, M.D. Ph.D.  [facility] [address] [place], [place] [address] Ph: [phone] Fax: [phone]  CC: Dettinger, Fransisca Kaufmann, MD

## 2019-06-15 ENCOUNTER — Ambulatory Visit: Payer: Medicare Other | Admitting: Family Medicine

## 2019-06-15 ENCOUNTER — Encounter: Payer: Self-pay | Admitting: Family Medicine

## 2019-06-15 ENCOUNTER — Other Ambulatory Visit: Payer: Self-pay

## 2019-06-15 VITALS — BP 105/65 | HR 78 | Temp 98.0°F | Ht 66.0 in | Wt 209.0 lb

## 2019-06-15 DIAGNOSIS — E1143 Type 2 diabetes mellitus with diabetic autonomic (poly)neuropathy: Secondary | ICD-10-CM | POA: Diagnosis not present

## 2019-06-15 DIAGNOSIS — I10 Essential (primary) hypertension: Secondary | ICD-10-CM

## 2019-06-15 DIAGNOSIS — E11610 Type 2 diabetes mellitus with diabetic neuropathic arthropathy: Secondary | ICD-10-CM

## 2019-06-15 DIAGNOSIS — E1149 Type 2 diabetes mellitus with other diabetic neurological complication: Secondary | ICD-10-CM | POA: Diagnosis not present

## 2019-06-15 MED ORDER — METFORMIN HCL ER 500 MG PO TB24: 1000.0000 mg | ORAL_TABLET | Freq: Two times a day (BID) | ORAL | 3 refills | Status: DC

## 2019-06-15 NOTE — Progress Notes (Signed)
BP 105/65   Pulse 78   Temp 98 F (36.7 C)   Ht 5' 6"  (1.676 m)   Wt 209 lb (94.8 kg)   SpO2 96%   BMI 33.73 kg/m    Subjective:   [name], female    DOB: 18-Nov-1945, 74 y.o.   MRN: [id number]  HPI: [name] is a 74 y.o. female presenting on 06/15/2019 for Medical Management of Chronic Issues (4 week re check. Seen by [facility] for possible stroke since last visit.), Diabetes, and Fatigue   HPI Patient is coming in for follow-up on neurology, they said they do not think that she had strokes.  They think it may be related to anxiety or something further but she will follow up with them on this.  She has had less frequent episodes and it has been improving.  She says most of the time it happens when she is eating.  Type 2 diabetes mellitus Patient comes in today for recheck of his diabetes. Patient has been currently taking Metformin 1000 daily and Ozempic 2 mg weekly. Patient is currently on an ACE inhibitor/ARB. Patient has not seen an ophthalmologist this year. Patient denies any issues with their feet.   Relevant past medical, surgical, family and social history reviewed and updated as indicated. Interim medical history since our last visit reviewed. Allergies and medications reviewed and updated.  Review of Systems  Constitutional: Negative for chills and fever.  Eyes: Negative for visual disturbance.  Respiratory: Negative for chest tightness and shortness of breath.   Cardiovascular: Negative for chest pain and leg swelling.  Musculoskeletal: Negative for back pain and gait problem.  Skin: Negative for rash.  Neurological: Negative for dizziness, light-headedness and headaches.  Psychiatric/Behavioral: Negative for agitation and behavioral problems.  All other systems reviewed and are negative.   Per HPI unless specifically indicated above   Allergies as of 06/15/2019      Reactions   Penicillins Other (See Comments)   Blisters in mouth Did it  involve swelling of the face/tongue/throat, SOB, or low BP? No Did it involve sudden or severe rash/hives, skin peeling, or any reaction on the inside of your mouth or nose? No Did you need to seek medical attention at a hospital or doctor's office? No When did it last happen?Childhood allergy If all above answers are "NO", may proceed with cephalosporin use.   Codeine Nausea And Vomiting   Erythromycin Nausea And Vomiting   Fenofibrate Other (See Comments)   aching & edema    Lyrica [pregabalin] Other (See Comments)   Edema in feet   Statins Other (See Comments)   Joints ache   Sulfa Antibiotics Nausea And Vomiting      Medication List       Accurate as of June 15, 2019  9:35 AM. If you have any questions, ask your nurse or doctor.        ALPRAZolam 0.5 MG tablet Commonly known as: XANAX Take 1 tablet (0.5 mg total) by mouth 2 (two) times daily as needed for anxiety. Take 1-2 tabs daily as needed   ARIPiprazole 2 MG tablet Commonly known as: ABILIFY Take 1 tablet (2 mg total) by mouth daily.   blood glucose meter kit and supplies Kit Dispense based on patient and insurance preference. Use up to two times daily as directed. (Dx: type 2 DM - E11.9)   clopidogrel 75 MG tablet Commonly known as: Plavix Take 1 tablet (75 mg total) by mouth  daily.   escitalopram 20 MG tablet Commonly known as: LEXAPRO Take 1 tablet (20 mg total) by mouth daily.   ezetimibe 10 MG tablet Commonly known as: ZETIA Take 1 tablet (10 mg total) by mouth daily.   Fish Oil 1000 MG Caps Take by mouth. Take 2 pills daily   gabapentin 600 MG tablet Commonly known as: NEURONTIN Take 1 tablet (600 mg total) by mouth 4 (four) times daily.   glucose blood test strip Commonly known as: ONE TOUCH ULTRA TEST CHECK BLOOD SUGAR UP TO 2 TIMES A DAY   HYDROcodone-acetaminophen 5-325 MG tablet Commonly known as: NORCO/VICODIN Take 1 tablet by mouth every 6 (six) hours as needed for moderate  pain.   lisinopril-hydrochlorothiazide 20-25 MG tablet Commonly known as: ZESTORETIC Take 1 tablet by mouth daily.   Livalo 2 MG Tabs Generic drug: Pitavastatin Calcium Take 1 tablet (2 mg total) by mouth every other day.   LUTEIN 20 PO Take by mouth daily.   metFORMIN 500 MG 24 hr tablet Commonly known as: GLUCOPHAGE-XR Take 2 tablets (1,000 mg total) by mouth in the morning and at bedtime. What changed: when to take this Changed by: Fransisca Kaufmann Justan Gaede, MD   Nicorelief 2 MG gum Generic drug: nicotine polacrilex Take 2 mg by mouth daily.   OneTouch Delica Lancets 27O Misc CHECK BLOOD SUGAR UP TO TWICE DAILY   OneTouch Verio w/Device Kit Test BS BID Dx E11.9   Ozempic (1 MG/DOSE) 2 MG/1.5ML Sopn Generic drug: Semaglutide (1 MG/DOSE) Inject 1 mg into the skin once a week.   pantoprazole 40 MG tablet Commonly known as: Protonix Take 1 tablet (40 mg total) by mouth daily.   Vitamin D 50 MCG (2000 UT) tablet Take 2,000 Units by mouth daily.        Objective:   BP 105/65   Pulse 78   Temp 98 F (36.7 C)   Ht 5' 6"  (1.676 m)   Wt 209 lb (94.8 kg)   SpO2 96%   BMI 33.73 kg/m   Wt Readings from Last 3 Encounters:  06/15/19 209 lb (94.8 kg)  06/14/19 210 lb (95.3 kg)  05/18/19 202 lb 9.6 oz (91.9 kg)    Physical Exam Vitals and nursing note reviewed.  Constitutional:      General: She is not in acute distress.    Appearance: She is well-developed. She is not diaphoretic.  Eyes:     Conjunctiva/sclera: Conjunctivae normal.  Cardiovascular:     Rate and Rhythm: Normal rate and regular rhythm.     Heart sounds: Normal heart sounds. No murmur.  Pulmonary:     Effort: Pulmonary effort is normal. No respiratory distress.     Breath sounds: Normal breath sounds. No wheezing.  Musculoskeletal:        General: No tenderness. Normal range of motion.  Skin:    General: Skin is warm and dry.     Findings: No rash.  Neurological:     Mental Status: She is alert  and oriented to person, place, and time.     Coordination: Coordination normal.  Psychiatric:        Behavior: Behavior normal.     Results for orders placed or performed in visit on 05/18/19  CBC with Differential/Platelet  Result Value Ref Range   WBC 9.2 3.4 - 10.8 x10E3/uL   RBC 4.65 3.77 - 5.28 x10E6/uL   Hemoglobin 12.8 11.1 - 15.9 g/dL   Hematocrit 39.0 34.0 - 46.6 %  MCV 84 79 - 97 fL   MCH 27.5 26.6 - 33.0 pg   MCHC 32.8 31.5 - 35.7 g/dL   RDW 14.6 11.7 - 15.4 %   Platelets 250 150 - 450 x10E3/uL   Neutrophils 64 Not Estab. %   Lymphs 23 Not Estab. %   Monocytes 9 Not Estab. %   Eos 3 Not Estab. %   Basos 1 Not Estab. %   Neutrophils Absolute 5.9 1.4 - 7.0 x10E3/uL   Lymphocytes Absolute 2.1 0.7 - 3.1 x10E3/uL   Monocytes Absolute 0.8 0.1 - 0.9 x10E3/uL   EOS (ABSOLUTE) 0.3 0.0 - 0.4 x10E3/uL   Basophils Absolute 0.1 0.0 - 0.2 x10E3/uL   Immature Granulocytes 0 Not Estab. %   Immature Grans (Abs) 0.0 0.0 - 0.1 x10E3/uL  CMP14+EGFR  Result Value Ref Range   Glucose 94 65 - 99 mg/dL   BUN 10 8 - 27 mg/dL   Creatinine, Ser 0.79 0.57 - 1.00 mg/dL   GFR calc non Af Amer 74 >59 mL/min/1.73   GFR calc Af Amer 86 >59 mL/min/1.73   BUN/Creatinine Ratio 13 12 - 28   Sodium 136 134 - 144 mmol/L   Potassium 4.5 3.5 - 5.2 mmol/L   Chloride 94 (L) 96 - 106 mmol/L   CO2 22 20 - 29 mmol/L   Calcium 9.5 8.7 - 10.3 mg/dL   Total Protein 6.7 6.0 - 8.5 g/dL   Albumin 4.2 3.7 - 4.7 g/dL   Globulin, Total 2.5 1.5 - 4.5 g/dL   Albumin/Globulin Ratio 1.7 1.2 - 2.2   Bilirubin Total 0.3 0.0 - 1.2 mg/dL   Alkaline Phosphatase 80 39 - 117 IU/L   AST 31 0 - 40 IU/L   ALT 34 (H) 0 - 32 IU/L  Bayer DCA Hb A1c Waived  Result Value Ref Range   HB A1C (BAYER DCA - WAIVED) 7.5 (H) <7.0 %  TSH  Result Value Ref Range   TSH 1.730 0.450 - 4.500 uIU/mL    Assessment & Plan:   Problem List Items Addressed This Visit      Cardiovascular and Mediastinum   Essential hypertension,  benign (Chronic)     Endocrine   Diabetes (HCC) - Primary (Chronic)   Relevant Medications   metFORMIN (GLUCOPHAGE-XR) 500 MG 24 hr tablet   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HCC)   Relevant Medications   metFORMIN (GLUCOPHAGE-XR) 500 MG 24 hr tablet   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  Her A1c last time was 7.5 and she says her blood sugars are running between 150 and 200, we will increase her Metformin to twice a day Follow up plan: Return in about 4 weeks (around 07/13/2019), or if symptoms worsen or fail to improve, for Recheck diabetes and anxiety.  Counseling provided for all of the vaccine components No orders of the defined types were placed in this encounter.   Caryl Pina, MD [facility] 06/15/2019, 9:35 AM

## 2019-06-20 ENCOUNTER — Telehealth: Payer: Self-pay | Admitting: Family Medicine

## 2019-06-20 DIAGNOSIS — M5137 Other intervertebral disc degeneration, lumbosacral region: Secondary | ICD-10-CM | POA: Diagnosis not present

## 2019-06-20 DIAGNOSIS — M545 Low back pain: Secondary | ICD-10-CM | POA: Diagnosis not present

## 2019-06-20 DIAGNOSIS — Z79891 Long term (current) use of opiate analgesic: Secondary | ICD-10-CM | POA: Diagnosis not present

## 2019-06-20 DIAGNOSIS — M5136 Other intervertebral disc degeneration, lumbar region: Secondary | ICD-10-CM | POA: Diagnosis not present

## 2019-06-20 NOTE — Telephone Encounter (Signed)
Pt called stating that she recently saw Dr Dettinger about her blood sugar issue. Was told to take 2 metformin pills in the AM and 2 metformin pills in the PM. Pt says she has been doing that but says it makes her feel so nauseated. Requested that Dr Dettinger try her on something else.

## 2019-06-20 NOTE — Telephone Encounter (Signed)
Yes have her try that first

## 2019-06-20 NOTE — Telephone Encounter (Signed)
Patient will back off on the Metformin and do 2 in the morning and 1 in the evening.  She has not picked up the [place] yet but plans to because she will need to take it on Sunday.

## 2019-06-20 NOTE — Telephone Encounter (Signed)
Okay have her back off and try and do 2 in the morning and 1 in the evening and see if that does better, make sure she is taking it with food, did she get the [place]?  Has she started that?

## 2019-06-27 ENCOUNTER — Other Ambulatory Visit: Payer: Self-pay

## 2019-06-27 ENCOUNTER — Telehealth: Payer: Self-pay

## 2019-06-27 DIAGNOSIS — Z0120 Encounter for dental examination and cleaning without abnormal findings: Secondary | ICD-10-CM | POA: Diagnosis not present

## 2019-06-27 DIAGNOSIS — E1149 Type 2 diabetes mellitus with other diabetic neurological complication: Secondary | ICD-10-CM

## 2019-06-27 MED ORDER — ATORVASTATIN CALCIUM 10 MG PO TABS: 10.0000 mg | ORAL_TABLET | Freq: Every day | ORAL | 3 refills | Status: DC

## 2019-06-27 NOTE — Telephone Encounter (Signed)
I have this changed this to Lipitor 10 mg that you can take every other day. If you are still having muscle pain with taking it every other day you can take M, W, F

## 2019-06-27 NOTE — Telephone Encounter (Signed)
Pt states that new med Livalo per pharmacy is $285. She would like something else called in.  Per pharmacy this is the cost for a 90 day supply and it is due to pt's deductible.

## 2019-06-28 NOTE — Telephone Encounter (Signed)
Patient aware, script is ready. 

## 2019-06-30 DIAGNOSIS — M5136 Other intervertebral disc degeneration, lumbar region: Secondary | ICD-10-CM | POA: Diagnosis not present

## 2019-07-05 DIAGNOSIS — H524 Presbyopia: Secondary | ICD-10-CM | POA: Diagnosis not present

## 2019-07-05 DIAGNOSIS — H04123 Dry eye syndrome of bilateral lacrimal glands: Secondary | ICD-10-CM | POA: Diagnosis not present

## 2019-07-05 DIAGNOSIS — H5203 Hypermetropia, bilateral: Secondary | ICD-10-CM | POA: Diagnosis not present

## 2019-07-05 DIAGNOSIS — H52223 Regular astigmatism, bilateral: Secondary | ICD-10-CM | POA: Diagnosis not present

## 2019-07-13 ENCOUNTER — Telehealth: Payer: Self-pay | Admitting: Family Medicine

## 2019-07-13 NOTE — Telephone Encounter (Signed)
Okay that is fine, go back to taking that, next visit we will discuss possible other medications if A1c is still up.

## 2019-07-13 NOTE — Telephone Encounter (Signed)
Did you mean to send in Lipitor. Patient doesn't know anything about it.

## 2019-07-13 NOTE — Telephone Encounter (Signed)
It was asked to [facility] that sent in, I do recommend that is a good idea to try the Lipitor she can.

## 2019-07-14 NOTE — Telephone Encounter (Signed)
Patient aware and states she will take lipitor.  Please advise on nausea- covering pcp

## 2019-07-14 NOTE — Telephone Encounter (Signed)
Patient aware.

## 2019-07-14 NOTE — Telephone Encounter (Signed)
Try taking the metformin with a large meal, this will help the nausea.

## 2019-07-17 ENCOUNTER — Other Ambulatory Visit: Payer: Self-pay

## 2019-07-17 ENCOUNTER — Other Ambulatory Visit: Payer: Medicare Other

## 2019-07-17 DIAGNOSIS — K8020 Calculus of gallbladder without cholecystitis without obstruction: Secondary | ICD-10-CM | POA: Diagnosis not present

## 2019-07-17 DIAGNOSIS — J9811 Atelectasis: Secondary | ICD-10-CM | POA: Diagnosis not present

## 2019-07-17 DIAGNOSIS — E1149 Type 2 diabetes mellitus with other diabetic neurological complication: Secondary | ICD-10-CM

## 2019-07-17 DIAGNOSIS — M48061 Spinal stenosis, lumbar region without neurogenic claudication: Secondary | ICD-10-CM | POA: Diagnosis not present

## 2019-07-17 LAB — BAYER DCA HB A1C WAIVED: HB A1C (BAYER DCA - WAIVED): 7.5 % — ABNORMAL HIGH (ref <7.0)

## 2019-07-19 ENCOUNTER — Telehealth (INDEPENDENT_AMBULATORY_CARE_PROVIDER_SITE_OTHER): Payer: Medicare Other | Admitting: Family Medicine

## 2019-07-19 ENCOUNTER — Encounter: Payer: Self-pay | Admitting: Family Medicine

## 2019-07-19 DIAGNOSIS — E11610 Type 2 diabetes mellitus with diabetic neuropathic arthropathy: Secondary | ICD-10-CM | POA: Diagnosis not present

## 2019-07-19 DIAGNOSIS — E1143 Type 2 diabetes mellitus with diabetic autonomic (poly)neuropathy: Secondary | ICD-10-CM | POA: Diagnosis not present

## 2019-07-19 DIAGNOSIS — E1149 Type 2 diabetes mellitus with other diabetic neurological complication: Secondary | ICD-10-CM | POA: Diagnosis not present

## 2019-07-19 DIAGNOSIS — I10 Essential (primary) hypertension: Secondary | ICD-10-CM | POA: Diagnosis not present

## 2019-07-19 MED ORDER — JARDIANCE 10 MG PO TABS: 10.0000 mg | ORAL_TABLET | Freq: Every day | ORAL | 2 refills | Status: DC

## 2019-07-19 MED ORDER — METFORMIN HCL ER 500 MG PO TB24: 1000.0000 mg | ORAL_TABLET | Freq: Every day | ORAL | 3 refills | Status: DC

## 2019-07-19 NOTE — Progress Notes (Signed)
Virtual Visit via telephone Note  I connected with [name] on 07/19/19 at [facility] by telephone and verified that I am speaking with the correct person using two identifiers. ZAYDAH Haas is currently located at home and no other people are currently with her during visit. The provider, Fransisca Kaufmann Jaelle Campanile, MD is located in their office at time of visit.  Call ended at 0901  I discussed the limitations, risks, security and privacy concerns of performing an evaluation and management service by telephone and the availability of in person appointments. I also discussed with the patient that there may be a patient responsible charge related to this service. The patient expressed understanding and agreed to proceed.   History and Present Illness: Type 2 diabetes mellitus Patient comes in today for recheck of his diabetes. Patient has been currently taking ozempic and metformin. Patient is currently on an ACE inhibitor/ARB. Patient has not seen an ophthalmologist this year. Patient denies any issues with their feet.   Hypertension Patient is currently on lisinopril and hydrochlorathiazide, and their blood pressure today is unknown. Patient denies any lightheadedness or dizziness. Patient denies headaches, blurred vision, chest pains, shortness of breath, or weakness. Denies any side effects from medication and is content with current medication.   Hyperlipidemia Patient is coming in for recheck of his hyperlipidemia. The patient is currently taking lipitor. They deny any issues with myalgias or history of liver damage from it. They deny any focal numbness or weakness or chest pain.   No diagnosis found.  Outpatient Encounter Medications as of 07/19/2019  Medication Sig  . ALPRAZolam (XANAX) 0.5 MG tablet Take 1 tablet (0.5 mg total) by mouth 2 (two) times daily as needed for anxiety. Take 1-2 tabs daily as needed  . ARIPiprazole (ABILIFY) 2 MG tablet Take 1 tablet (2 mg total) by mouth daily.    Marland Kitchen atorvastatin (LIPITOR) 10 MG tablet Take 1 tablet (10 mg total) by mouth daily.  . blood glucose meter kit and supplies KIT Dispense based on patient and insurance preference. Use up to two times daily as directed. (Dx: type 2 DM - E11.9)  . Blood Glucose Monitoring Suppl (ONETOUCH VERIO) w/Device KIT Test BS BID Dx E11.9  . Cholecalciferol (VITAMIN D) 50 MCG (2000 UT) tablet Take 2,000 Units by mouth daily.   Marland Kitchen escitalopram (LEXAPRO) 20 MG tablet Take 1 tablet (20 mg total) by mouth daily.  Marland Kitchen ezetimibe (ZETIA) 10 MG tablet Take 1 tablet (10 mg total) by mouth daily.  Marland Kitchen gabapentin (NEURONTIN) 600 MG tablet Take 1 tablet (600 mg total) by mouth 4 (four) times daily.  Marland Kitchen glucose blood (ONE TOUCH ULTRA TEST) test strip CHECK BLOOD SUGAR UP TO 2 TIMES A DAY  . HYDROcodone-acetaminophen (NORCO/VICODIN) 5-325 MG tablet Take 1 tablet by mouth every 6 (six) hours as needed for moderate pain.  Marland Kitchen lisinopril-hydrochlorothiazide (ZESTORETIC) 20-25 MG tablet Take 1 tablet by mouth daily.  . metFORMIN (GLUCOPHAGE-XR) 500 MG 24 hr tablet Take 2 tablets (1,000 mg total) by mouth in the morning and at bedtime.  . Misc Natural Products (LUTEIN 20 PO) Take by mouth daily.  . nicotine polacrilex (NICORELIEF) 2 MG gum Take 2 mg by mouth daily.  . Omega-3 Fatty Acids (FISH OIL) 1000 MG CAPS Take by mouth. Take 2 pills daily  . ONETOUCH DELICA LANCETS 93Z MISC CHECK BLOOD SUGAR UP TO TWICE DAILY  . pantoprazole (PROTONIX) 40 MG tablet Take 1 tablet (40 mg total) by mouth daily.  Marland Kitchen  Semaglutide, 1 MG/DOSE, (OZEMPIC, 1 MG/DOSE,) 2 MG/1.5ML SOPN Inject 1 mg into the skin once a week.   No facility-administered encounter medications on file as of 07/19/2019.    Review of Systems  Constitutional: Negative for chills and fever.  Eyes: Negative for visual disturbance.  Respiratory: Negative for chest tightness and shortness of breath.   Cardiovascular: Negative for chest pain and leg swelling.  Musculoskeletal:  Negative for back pain and gait problem.  Skin: Negative for rash.  Neurological: Positive for dizziness (will check BP and let me know). Negative for light-headedness and headaches.  Psychiatric/Behavioral: Negative for agitation and behavioral problems.  All other systems reviewed and are negative.   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     I discussed the assessment and treatment plan with the patient. The patient was provided an opportunity to ask questions and all were answered. The patient agreed with the plan and demonstrated an understanding of the instructions.   The patient was advised to call back or seek an in-person evaluation if the symptoms worsen or if the condition fails to improve as anticipated.  The above assessment and management plan was discussed with the patient. The patient verbalized understanding of and has agreed to the management plan. Patient is aware to call the clinic if symptoms persist or worsen. Patient is aware when to return to the clinic for a follow-up visit. Patient educated on when it is  appropriate to go to the emergency department.    I provided 21 minutes of non-face-to-face time during this encounter.    Worthy Rancher, MD

## 2019-07-20 DIAGNOSIS — M5136 Other intervertebral disc degeneration, lumbar region: Secondary | ICD-10-CM | POA: Diagnosis not present

## 2019-07-20 DIAGNOSIS — M5137 Other intervertebral disc degeneration, lumbosacral region: Secondary | ICD-10-CM | POA: Diagnosis not present

## 2019-07-20 DIAGNOSIS — M48062 Spinal stenosis, lumbar region with neurogenic claudication: Secondary | ICD-10-CM | POA: Diagnosis not present

## 2019-08-03 ENCOUNTER — Other Ambulatory Visit: Payer: Self-pay

## 2019-08-03 ENCOUNTER — Ambulatory Visit (INDEPENDENT_AMBULATORY_CARE_PROVIDER_SITE_OTHER): Payer: Medicare Other | Admitting: Family Medicine

## 2019-08-03 ENCOUNTER — Encounter: Payer: Self-pay | Admitting: Family Medicine

## 2019-08-03 VITALS — BP 112/66 | HR 86 | Temp 97.9°F | Ht 66.0 in | Wt 206.4 lb

## 2019-08-03 DIAGNOSIS — E1149 Type 2 diabetes mellitus with other diabetic neurological complication: Secondary | ICD-10-CM

## 2019-08-03 DIAGNOSIS — K219 Gastro-esophageal reflux disease without esophagitis: Secondary | ICD-10-CM

## 2019-08-03 DIAGNOSIS — E1142 Type 2 diabetes mellitus with diabetic polyneuropathy: Secondary | ICD-10-CM

## 2019-08-03 DIAGNOSIS — I10 Essential (primary) hypertension: Secondary | ICD-10-CM

## 2019-08-03 DIAGNOSIS — E1143 Type 2 diabetes mellitus with diabetic autonomic (poly)neuropathy: Secondary | ICD-10-CM

## 2019-08-03 DIAGNOSIS — E11610 Type 2 diabetes mellitus with diabetic neuropathic arthropathy: Secondary | ICD-10-CM | POA: Diagnosis not present

## 2019-08-03 LAB — BAYER DCA HB A1C WAIVED: HB A1C (BAYER DCA - WAIVED): 7.8 % — ABNORMAL HIGH (ref <7.0)

## 2019-08-03 NOTE — Progress Notes (Signed)
BP 112/66   Pulse 86   Temp 97.9 F (36.6 C)   Ht 5' 6"  (1.676 m)   Wt 206 lb 6 oz (93.6 kg)   SpO2 97%   BMI 33.31 kg/m    Subjective:   [name], female    DOB: 05-15-1945, 74 y.o.   MRN: [id number]  HPI: [name] is a 74 y.o. female presenting on 08/03/2019 for Medical Management of Chronic Issues, Hypertension, and Diabetes   HPI Type 2 diabetes mellitus Patient comes in today for recheck of his diabetes. Patient has been currently taking Jardiance and metformin and Ozempic. Patient is currently on an ACE inhibitor/ARB. Patient has not seen an ophthalmologist this year. Patient denies any issues with their feet.   Hypertension Patient is currently on lisinopril hydrochlorothiazide, and their blood pressure today is 112/66. Patient denies any lightheadedness or dizziness. Patient denies headaches, blurred vision, chest pains, shortness of breath, or weakness. Denies any side effects from medication and is content with current medication.   Hyperlipidemia Patient is coming in for recheck of his hyperlipidemia. The patient is currently taking Zetia and Lipitor and fish oil. They deny any issues with myalgias or history of liver damage from it. They deny any focal numbness or weakness or chest pain.   Relevant past medical, surgical, family and social history reviewed and updated as indicated. Interim medical history since our last visit reviewed. Allergies and medications reviewed and updated.  Review of Systems  Constitutional: Negative for chills and fever.  Eyes: Negative for visual disturbance.  Respiratory: Negative for chest tightness and shortness of breath.   Cardiovascular: Negative for chest pain and leg swelling.  Musculoskeletal: Negative for back pain and gait problem.  Skin: Negative for rash.  Neurological: Positive for numbness. Negative for light-headedness and headaches.  Psychiatric/Behavioral: Negative for agitation and behavioral  problems.  All other systems reviewed and are negative.   Per HPI unless specifically indicated above   Allergies as of 08/03/2019      Reactions   Penicillins Other (See Comments)   Blisters in mouth Did it involve swelling of the face/tongue/throat, SOB, or low BP? No Did it involve sudden or severe rash/hives, skin peeling, or any reaction on the inside of your mouth or nose? No Did you need to seek medical attention at a hospital or doctor's office? No When did it last happen?Childhood allergy If all above answers are "NO", may proceed with cephalosporin use.   Codeine Nausea And Vomiting   Erythromycin Nausea And Vomiting   Fenofibrate Other (See Comments)   aching & edema    Lyrica [pregabalin] Other (See Comments)   Edema in feet   Statins Other (See Comments)   Joints ache   Sulfa Antibiotics Nausea And Vomiting      Medication List       Accurate as of August 03, 2019  1:34 PM. If you have any questions, ask your nurse or doctor.        STOP taking these medications   Livalo 2 MG Tabs Generic drug: Pitavastatin Calcium Stopped by: Fransisca Kaufmann Liah Morr, MD     TAKE these medications   ALPRAZolam 0.5 MG tablet Commonly known as: XANAX Take 1 tablet (0.5 mg total) by mouth 2 (two) times daily as needed for anxiety. Take 1-2 tabs daily as needed   ARIPiprazole 2 MG tablet Commonly known as: ABILIFY Take 1 tablet (2 mg total) by mouth daily.   atorvastatin 10  MG tablet Commonly known as: LIPITOR Take 1 tablet (10 mg total) by mouth daily.   blood glucose meter kit and supplies Kit Dispense based on patient and insurance preference. Use up to two times daily as directed. (Dx: type 2 DM - E11.9)   clopidogrel 75 MG tablet Commonly known as: PLAVIX Take 75 mg by mouth daily.   escitalopram 20 MG tablet Commonly known as: LEXAPRO Take 1 tablet (20 mg total) by mouth daily.   ezetimibe 10 MG tablet Commonly known as: ZETIA Take 1 tablet (10 mg total)  by mouth daily.   Fish Oil 1000 MG Caps Take by mouth. Take 2 pills daily   gabapentin 600 MG tablet Commonly known as: NEURONTIN Take 1 tablet (600 mg total) by mouth 4 (four) times daily.   glucose blood test strip Commonly known as: ONE TOUCH ULTRA TEST CHECK BLOOD SUGAR UP TO 2 TIMES A DAY   HYDROcodone-acetaminophen 5-325 MG tablet Commonly known as: NORCO/VICODIN Take 1 tablet by mouth every 6 (six) hours as needed for moderate pain.   Jardiance 10 MG Tabs tablet Generic drug: empagliflozin Take 10 mg by mouth daily before breakfast.   lisinopril-hydrochlorothiazide 20-25 MG tablet Commonly known as: ZESTORETIC Take 1 tablet by mouth daily.   LUTEIN 20 PO Take by mouth daily.   metFORMIN 500 MG 24 hr tablet Commonly known as: GLUCOPHAGE-XR Take 2 tablets (1,000 mg total) by mouth daily with breakfast.   Nicorelief 2 MG gum Generic drug: nicotine polacrilex Take 2 mg by mouth daily.   OneTouch Delica Lancets 11B Misc CHECK BLOOD SUGAR UP TO TWICE DAILY   OneTouch Verio w/Device Kit Test BS BID Dx E11.9   Ozempic (1 MG/DOSE) 2 MG/1.5ML Sopn Generic drug: Semaglutide (1 MG/DOSE) Inject 1 mg into the skin once a week.   pantoprazole 40 MG tablet Commonly known as: Protonix Take 1 tablet (40 mg total) by mouth daily.   Vitamin D 50 MCG (2000 UT) tablet Take 2,000 Units by mouth daily.        Objective:   BP 112/66   Pulse 86   Temp 97.9 F (36.6 C)   Ht 5' 6"  (1.676 m)   Wt 206 lb 6 oz (93.6 kg)   SpO2 97%   BMI 33.31 kg/m   Wt Readings from Last 3 Encounters:  08/03/19 206 lb 6 oz (93.6 kg)  06/15/19 209 lb (94.8 kg)  06/14/19 210 lb (95.3 kg)    Physical Exam Vitals and nursing note reviewed.  Constitutional:      General: She is not in acute distress.    Appearance: She is well-developed. She is not diaphoretic.  Eyes:     Conjunctiva/sclera: Conjunctivae normal.  Cardiovascular:     Rate and Rhythm: Normal rate and regular rhythm.       Heart sounds: Normal heart sounds. No murmur.  Pulmonary:     Effort: Pulmonary effort is normal. No respiratory distress.     Breath sounds: Normal breath sounds. No wheezing.  Musculoskeletal:        General: No tenderness. Normal range of motion.  Skin:    General: Skin is warm and dry.     Findings: No rash.  Neurological:     Mental Status: She is alert and oriented to person, place, and time.     Coordination: Coordination normal.  Psychiatric:        Behavior: Behavior normal.       Assessment & Plan:   Problem  List Items Addressed This Visit      Cardiovascular and Mediastinum   Essential hypertension, benign (Chronic)     Digestive   GERD (gastroesophageal reflux disease)     Endocrine   Diabetes (HCC) (Chronic)   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[place])   Diabetic autonomic neuropathy associated with type 2 diabetes mellitus ([place]) - Primary      Patient is cutting lisinopril in half and it seems like her blood pressures are doing well so keep doing a half a tablet and will monitor it from there.  Her diabetes is not doing well, recommended significant diet and lifestyle change, if not improved by next time then the next recourse would be to start insulin, patient is hesitant towards this and does not want to start it right now.  Will do referral to Lottie Dawson, clinical pharmacist for further diabetic education. Follow up plan: Return in about 3 months (around 11/02/2019), or if symptoms worsen or fail to improve, for Diabetes and hypertension, please also set up an appointment with Lottie Dawson, pharmacist.  Counseling provided for all of the vaccine components No orders of the defined types were placed in this encounter.   Caryl Pina, MD [facility] 08/03/2019, 1:34 PM

## 2019-08-04 ENCOUNTER — Other Ambulatory Visit: Payer: Self-pay | Admitting: *Deleted

## 2019-08-04 MED ORDER — PANTOPRAZOLE SODIUM 40 MG PO TBEC: 40.0000 mg | DELAYED_RELEASE_TABLET | Freq: Every day | ORAL | 0 refills | Status: DC

## 2019-08-15 DIAGNOSIS — M5136 Other intervertebral disc degeneration, lumbar region: Secondary | ICD-10-CM | POA: Diagnosis not present

## 2019-08-18 ENCOUNTER — Ambulatory Visit: Payer: Medicare Other | Admitting: Pharmacist

## 2019-08-21 ENCOUNTER — Other Ambulatory Visit: Payer: Self-pay

## 2019-08-21 ENCOUNTER — Other Ambulatory Visit: Payer: Self-pay | Admitting: Family Medicine

## 2019-08-21 ENCOUNTER — Ambulatory Visit (INDEPENDENT_AMBULATORY_CARE_PROVIDER_SITE_OTHER): Payer: Medicare Other | Admitting: Pharmacist

## 2019-08-21 DIAGNOSIS — E1149 Type 2 diabetes mellitus with other diabetic neurological complication: Secondary | ICD-10-CM

## 2019-08-24 ENCOUNTER — Encounter: Payer: Self-pay | Admitting: Pharmacist

## 2019-08-24 NOTE — Progress Notes (Signed)
     08/21/2019 Name: [name] MRN: [id number] DOB: 11/17/1945  Referred by: Dettinger, Fransisca Kaufmann, MD Reason for referral : Diabetes  S:  74 yoF presents for diabetes evaluation, education, and management Patient was referred and last seen by Primary Care Provider on 08/03/19.  Insurance coverage/medication affordability: BCBS medicare   Patient reports adherence with medications.  Current diabetes medications include: Ozempic, Jardiance, metformin  Current hypertension medications include: lisinopril/hctz  Current hyperlipidemia medications include: zetia, atorvastatin  Patient denies hypoglycemic events.  Patient reported dietary habits: Eats 3 meals/day Discussed meal planning options and Plate method for health eating Avoid sugary drinks and desserts Incorporate balanced protein, non starchy veggies, 1 serving of carbohydrate Increase water intake Increase physical activity as able.  Patient-reported exercise habits: n/a, chronic pain  Patient denies nocturia (nighttime urination).  Patient reports neuropathy (nerve pain).  Patient denies visual changes.  Patient reports self foot exams.     O:    Lab Results  Component Value Date   HGBA1C 7.8 (H) 08/03/2019    Lipid Panel     Component Value Date/Time   CHOL 137 05/04/2019 0550   CHOL 149 10/06/2018 0804   CHOL 223 (H) 10/21/2012 0827   TRIG 137 05/04/2019 0550   TRIG 158 (H) 09/03/2016 0817   TRIG 367 (H) 10/21/2012 0827   HDL 36 (L) 05/04/2019 0550   HDL 50 10/06/2018 0804   HDL 48 09/03/2016 0817   HDL 40 10/21/2012 0827   CHOLHDL 3.8 05/04/2019 0550   VLDL 27 05/04/2019 0550   LDLCALC 74 05/04/2019 0550   LDLCALC 72 10/06/2018 0804   LDLCALC 74 11/28/2013 0855   LDLCALC 110 (H) 10/21/2012 0827    Home fasting blood sugars: 160-212  2 hour post-meal/random blood sugars: n/a.    A/P:  Diabetes currently uncontrolled. Patient is able to verbalize appropriate hypoglycemia management  plan. Patient is adherent with medication. Control is suboptimal due to diet/unable to exercise.  -CONTINUE GLP-1 once weekly  -Pt states she was better controlled on Trulicity --will trial sample to see   -HOLD Ozempic for now  -Instructed patient to call me with results in 2 weeks  -CONTINUE SGLT2 (Jardiance)  -plan to increase to 25mg  daily after Trulicity trial  -sample given:jardiance 25mg  EXP 6/23 LOT# [id number] , [id number] EXP 5/22  -Extensively discussed pathophysiology of diabetes, recommended lifestyle interventions, dietary effects on blood sugar control.  -Counseled on s/sx of and management of hypoglycemia  -Next A1C anticipated July 2021  -Patient with questions regard NuLeaf CBD oil products.  This product has not been evaluated or approved by the FDA, therefore I am unable to make recommendations.  After researching ingredients, there does not appear to be any drug-drug interactions.  All ingredients are represented as organic.     Written patient instructions provided.  Total time in face to face counseling 29 minutes.   Follow up Pharmacist Clinic Visit in 2 week.     Regina Eck, PharmD, BCPS Clinical Pharmacist, [place]  [facility] Phone [phone]

## 2019-08-25 ENCOUNTER — Telehealth: Payer: Self-pay | Admitting: *Deleted

## 2019-08-25 NOTE — Telephone Encounter (Signed)
Livalo was changed to Atorvastatin 10 mg on 06/27/19 - I will fax [facility] to D/C Livalo in their end - so that the PA notifications will stop.

## 2019-09-05 ENCOUNTER — Ambulatory Visit: Payer: Medicare Other | Admitting: Pharmacist

## 2019-09-06 ENCOUNTER — Other Ambulatory Visit: Payer: Self-pay | Admitting: Family Medicine

## 2019-09-06 ENCOUNTER — Ambulatory Visit (INDEPENDENT_AMBULATORY_CARE_PROVIDER_SITE_OTHER): Payer: Medicare Other | Admitting: Pharmacist

## 2019-09-06 DIAGNOSIS — F320 Major depressive disorder, single episode, mild: Secondary | ICD-10-CM

## 2019-09-06 DIAGNOSIS — E1149 Type 2 diabetes mellitus with other diabetic neurological complication: Secondary | ICD-10-CM

## 2019-09-06 NOTE — Progress Notes (Signed)
    09/06/2019 Name: JALEEAH Haas MRN: [id number] DOB: 1945-10-12  Name: [name] MRN: [id number]       DOB: [birth year]  Referred by: Dettinger, Fransisca Kaufmann, MD Reason for referral : Diabetes  S:  61 yoF presents for diabetes evaluation, education, and management Patient was referred and last seen by Primary Care Provider on 08/03/19.  Insurance coverage/medication affordability: BCBS medicare   Patient reports adherence with medications.  Current diabetes medications include: Ozempic, Jardiance, metformin  Current hypertension medications include: lisinopril/hctz  Current hyperlipidemia medications include: zetia, atorvastatin  Patient denies hypoglycemic events.  Patient reported dietary habits: Eats 3 meals/day  Discussed meal planning options and Plate method for health eating  Avoid sugary drinks and desserts  Incorporate balanced protein, non starchy veggies, 1 serving of carbohydrate  Increase water intake  Increase physical activity as able.  Patient-reported exercise habits: n/a, chronic pain  Patient denies nocturia (nighttime urination).  Patient reports neuropathy (nerve pain).  Patient denies visual changes.  Patient reports self foot exams.    O:    Recent Labs       Lab Results  Component Value Date   HGBA1C 7.8 (H) 08/03/2019      Lipid Panel  Labs (Brief)          Component Value Date/Time   CHOL 137 05/04/2019 0550   CHOL 149 10/06/2018 0804   CHOL 223 (H) 10/21/2012 0827   TRIG 137 05/04/2019 0550   TRIG 158 (H) 09/03/2016 0817   TRIG 367 (H) 10/21/2012 0827   HDL 36 (L) 05/04/2019 0550   HDL 50 10/06/2018 0804   HDL 48 09/03/2016 0817   HDL 40 10/21/2012 0827   CHOLHDL 3.8 05/04/2019 0550   VLDL 27 05/04/2019 0550   LDLCALC 74 05/04/2019 0550   LDLCALC 72 10/06/2018 0804   LDLCALC 74 11/28/2013 0855   LDLCALC 110 (H) 10/21/2012 0827      Home fasting blood sugars: 160-212, TODAY  176  2 hour post-meal/random blood sugars: n/a.    A/P:  Diabetes currently uncontrolled. Patient is able to verbalize appropriate hypoglycemia management plan. Patient is adherent with medication. Control is suboptimal due to diet/unable to exercise.  -CONTINUE GLP-1 once weekly             -WILL CONTINUE OZEMPIC              -CONTINUE SGLT2 (Jardiance)             -INCREASE to 25mg  daily    -CONTINUE METFORMIN             -Extensively discussed pathophysiology of diabetes, recommended lifestyle interventions, dietary effects on blood sugar control.  -Counseled on s/sx of and management of hypoglycemia  -Next A1C anticipated 11/09/19     Written patient instructions provided.  Total time in face to face counseling 29 minutes.   Follow up PCP Clinic Visit in AUGUST 2021   Regina Eck, PharmD, BCPS Clinical Pharmacist, [place]  [facility] Phone [phone]

## 2019-09-07 ENCOUNTER — Telehealth: Payer: Self-pay | Admitting: Pharmacist

## 2019-09-07 NOTE — Telephone Encounter (Signed)
Reviewed medications: -Increase Jardiance to 25mg  daily in addition to other meds -pt verbalizes understanding

## 2019-09-12 DIAGNOSIS — Z1231 Encounter for screening mammogram for malignant neoplasm of breast: Secondary | ICD-10-CM | POA: Diagnosis not present

## 2019-09-13 ENCOUNTER — Encounter: Payer: Self-pay | Admitting: Pharmacist

## 2019-09-14 ENCOUNTER — Telehealth: Payer: Self-pay | Admitting: Family Medicine

## 2019-09-14 NOTE — Telephone Encounter (Signed)
I don't see a contract. Please refill Xanax if appropriate.

## 2019-09-14 NOTE — Telephone Encounter (Signed)
To be filled in a visit

## 2019-09-14 NOTE — Telephone Encounter (Signed)
Patient has a follow up appointment scheduled. 

## 2019-09-14 NOTE — Telephone Encounter (Signed)
  Prescription Request  09/14/2019  What is the name of the medication or equipment? Alprazolam  Have you contacted your pharmacy to request a refill? (if applicable) Yes  Which pharmacy would you like this sent to? [facility]  Pt had last appt with Dr Dettinger on 08/03/19 and no refills were called in. Pt requesting refills.   Patient notified that their request is being sent to the clinical staff for review and that they should receive a response within 2 business days.

## 2019-09-15 ENCOUNTER — Other Ambulatory Visit: Payer: Self-pay | Admitting: Family Medicine

## 2019-09-21 ENCOUNTER — Ambulatory Visit (INDEPENDENT_AMBULATORY_CARE_PROVIDER_SITE_OTHER): Payer: Medicare Other | Admitting: Family Medicine

## 2019-09-21 ENCOUNTER — Encounter: Payer: Self-pay | Admitting: Family Medicine

## 2019-09-21 ENCOUNTER — Other Ambulatory Visit: Payer: Self-pay

## 2019-09-21 VITALS — BP 112/68 | HR 77 | Temp 97.7°F | Ht 66.0 in | Wt 203.0 lb

## 2019-09-21 DIAGNOSIS — R569 Unspecified convulsions: Secondary | ICD-10-CM

## 2019-09-21 DIAGNOSIS — Z79899 Other long term (current) drug therapy: Secondary | ICD-10-CM | POA: Diagnosis not present

## 2019-09-21 DIAGNOSIS — F320 Major depressive disorder, single episode, mild: Secondary | ICD-10-CM

## 2019-09-21 MED ORDER — ALPRAZOLAM 0.5 MG PO TABS: 0.5000 mg | ORAL_TABLET | ORAL | 2 refills | Status: DC | PRN

## 2019-09-21 NOTE — Progress Notes (Signed)
BP 112/68    Pulse 77    Temp 97.7 F (36.5 C)    Ht 5' 6"  (1.676 m)    Wt 203 lb (92.1 kg)    SpO2 98%    BMI 32.77 kg/m    Subjective:   [name], female    DOB: 05/19/1945, 74 y.o.   MRN: [id number]  HPI: [name] is a 74 y.o. female presenting on 09/21/2019 for Medical Management of Chronic Issues and Anxiety   HPI  Anxiety and insomnia and fibromyalgia Current rx-alprazolam 0.5 twice daily as needed, will switch to daily as needed # meds rx-30 Effectiveness of current meds-works well, only uses it when she really needs it Adverse reactions form meds-none  Pill count performed-No Last drug screen -N/A, ( high risk q42m moderate risk q669mlow risk yearly ) Urine drug screen today- Yes [place]  If yes were their any concerning findings? -She also gets a low-dose hydrocodone 3 times a day, recommended to start tapering and will reduce the dose of the alprazolam and only take it when she really needs it  No flowsheet data found.   Controlled substance contract signed on: Today  Patient had an episode of left-sided tremors and shaking and weakness the left are fatigued for the rest the day, it happened 2 days ago and she felt it on her whole left side from her arm all the way up to her face that she was shaking tremoring witnessed by a friend who is with her.  She says she did not lose consciousness but she did feel fatigued to the rest the day.  Relevant past medical, surgical, family and social history reviewed and updated as indicated. Interim medical history since our last visit reviewed. Allergies and medications reviewed and updated.  Review of Systems  Constitutional: Negative for chills and fever.  Eyes: Negative for visual disturbance.  Respiratory: Negative for chest tightness and shortness of breath.   Cardiovascular: Negative for chest pain and leg swelling.  Musculoskeletal: Negative for back pain and gait problem.  Skin:  Negative for rash.  Neurological: Positive for tremors. Negative for light-headedness and headaches.  Psychiatric/Behavioral: Positive for dysphoric mood. Negative for agitation and behavioral problems. The patient is nervous/anxious.   All other systems reviewed and are negative.   Per HPI unless specifically indicated above   Allergies as of 09/21/2019      Reactions   Penicillins Other (See Comments)   Blisters in mouth Did it involve swelling of the face/tongue/throat, SOB, or low BP? No Did it involve sudden or severe rash/hives, skin peeling, or any reaction on the inside of your mouth or nose? No Did you need to seek medical attention at a hospital or doctor's office? No When did it last happen?Childhood allergy If all above answers are "NO", may proceed with cephalosporin use.   Codeine Nausea And Vomiting   Erythromycin Nausea And Vomiting   Fenofibrate Other (See Comments)   aching & edema    Lyrica [pregabalin] Other (See Comments)   Edema in feet   Statins Other (See Comments)   Joints ache   Sulfa Antibiotics Nausea And Vomiting      Medication List       Accurate as of September 21, 2019 12:12 PM. If you have any questions, ask your nurse or doctor.        ALPRAZolam 0.5 MG tablet Commonly known as: XANAX Take 1 tablet (0.5 mg total)  by mouth as needed for anxiety. Take 1-2 tabs daily as needed What changed: when to take this Changed by: Worthy Rancher, MD   ARIPiprazole 2 MG tablet Commonly known as: ABILIFY Take 1 tablet (2 mg total) by mouth daily.   atorvastatin 10 MG tablet Commonly known as: LIPITOR Take 1 tablet (10 mg total) by mouth daily.   blood glucose meter kit and supplies Kit Dispense based on patient and insurance preference. Use up to two times daily as directed. (Dx: type 2 DM - E11.9)   clopidogrel 75 MG tablet Commonly known as: PLAVIX TAKE 1 TABLET DAILY   escitalopram 20 MG tablet Commonly known as: LEXAPRO Take 1  tablet (20 mg total) by mouth daily.   ezetimibe 10 MG tablet Commonly known as: ZETIA Take 1 tablet (10 mg total) by mouth daily.   Fish Oil 1000 MG Caps Take by mouth. Take 2 pills daily   gabapentin 600 MG tablet Commonly known as: NEURONTIN Take 1 tablet (600 mg total) by mouth 4 (four) times daily.   glucose blood test strip Commonly known as: ONE TOUCH ULTRA TEST CHECK BLOOD SUGAR UP TO 2 TIMES A DAY   HYDROcodone-acetaminophen 5-325 MG tablet Commonly known as: NORCO/VICODIN Take 1 tablet by mouth every 6 (six) hours as needed for moderate pain.   Jardiance 10 MG Tabs tablet Generic drug: empagliflozin Take 10 mg by mouth daily before breakfast. What changed: how much to take   lisinopril-hydrochlorothiazide 20-25 MG tablet Commonly known as: ZESTORETIC Take 1 tablet by mouth daily.   LUTEIN 20 PO Take by mouth daily.   metFORMIN 500 MG 24 hr tablet Commonly known as: GLUCOPHAGE-XR Take 2 tablets (1,000 mg total) by mouth daily with breakfast.   Nicorelief 2 MG gum Generic drug: nicotine polacrilex Take 2 mg by mouth daily.   OneTouch Delica Lancets 38G Misc CHECK BLOOD SUGAR UP TO TWICE DAILY   OneTouch Verio w/Device Kit Test BS BID Dx E11.9   Ozempic (1 MG/DOSE) 4 MG/3ML Sopn Generic drug: Semaglutide (1 MG/DOSE) INJECT 1 MG ONCE A WEEK AS DIRECTED   pantoprazole 40 MG tablet Commonly known as: Protonix Take 1 tablet (40 mg total) by mouth daily.   Vitamin D 50 MCG (2000 UT) tablet Take 2,000 Units by mouth daily.        Objective:   BP 112/68    Pulse 77    Temp 97.7 F (36.5 C)    Ht 5' 6"  (1.676 m)    Wt 203 lb (92.1 kg)    SpO2 98%    BMI 32.77 kg/m   Wt Readings from Last 3 Encounters:  09/21/19 203 lb (92.1 kg)  08/03/19 206 lb 6 oz (93.6 kg)  06/15/19 209 lb (94.8 kg)    Physical Exam Vitals and nursing note reviewed.  Constitutional:      General: She is not in acute distress.    Appearance: She is well-developed. She is  not diaphoretic.  Eyes:     Extraocular Movements: Extraocular movements intact.     Conjunctiva/sclera: Conjunctivae normal.     Pupils: Pupils are equal, round, and reactive to light.  Skin:    General: Skin is warm and dry.     Findings: No rash.  Neurological:     Mental Status: She is alert and oriented to person, place, and time.     Cranial Nerves: No cranial nerve deficit.     Sensory: No sensory deficit.  Motor: No weakness.     Coordination: Coordination normal.     Gait: Gait normal.  Psychiatric:        Behavior: Behavior normal.       Assessment & Plan:   Problem List Items Addressed This Visit      Other   Depression   Relevant Medications   ALPRAZolam (XANAX) 0.5 MG tablet    Other Visit Diagnoses    Controlled substance agreement signed    -  Primary   Relevant Medications   ALPRAZolam (XANAX) 0.5 MG tablet   Other Relevant Orders   ToxASSURE Select 13 (MW), Urine   Seizure-like activity ([place])       Relevant Orders   Ambulatory referral to Neurology      Will refer to neurology for possible seizure-like activity, and working to tapering alprazolam, will reduce from 45 to 30 pills/month, she is only using as needed right now. Follow up plan: Return in about 3 months (around 12/22/2019), or if symptoms worsen or fail to improve, for Depression and anxiety and blood work.  Counseling provided for all of the vaccine components Orders Placed This Encounter  Procedures   ToxASSURE Select 13 (MW), Urine   Ambulatory referral to Neurology    Caryl Pina, MD [facility] 09/21/2019, 12:12 PM

## 2019-09-26 LAB — TOXASSURE SELECT 13 (MW), URINE

## 2019-10-04 ENCOUNTER — Telehealth: Payer: Self-pay | Admitting: Family Medicine

## 2019-10-04 NOTE — Telephone Encounter (Signed)
Erroneous encounter

## 2019-10-05 ENCOUNTER — Telehealth: Payer: Self-pay | Admitting: Family Medicine

## 2019-10-05 NOTE — Telephone Encounter (Signed)
Pt returning call to the nurse for lab results.  

## 2019-10-06 NOTE — Telephone Encounter (Signed)
Erroneous visit

## 2019-10-06 NOTE — Telephone Encounter (Signed)
Aware of results.  Note sent to provider.

## 2019-10-10 ENCOUNTER — Other Ambulatory Visit: Payer: Self-pay | Admitting: Family Medicine

## 2019-10-11 ENCOUNTER — Other Ambulatory Visit: Payer: Self-pay | Admitting: *Deleted

## 2019-10-11 ENCOUNTER — Other Ambulatory Visit: Payer: Self-pay | Admitting: Family Medicine

## 2019-10-11 MED ORDER — ONETOUCH DELICA LANCETS 33G MISC: 3 refills | Status: DC

## 2019-10-12 ENCOUNTER — Ambulatory Visit: Payer: Medicare Other | Admitting: Family Medicine

## 2019-10-16 ENCOUNTER — Other Ambulatory Visit: Payer: Self-pay | Admitting: Family Medicine

## 2019-10-17 ENCOUNTER — Other Ambulatory Visit: Payer: Self-pay | Admitting: Family Medicine

## 2019-10-17 DIAGNOSIS — E1149 Type 2 diabetes mellitus with other diabetic neurological complication: Secondary | ICD-10-CM

## 2019-10-17 DIAGNOSIS — E11610 Type 2 diabetes mellitus with diabetic neuropathic arthropathy: Secondary | ICD-10-CM

## 2019-10-17 DIAGNOSIS — E1143 Type 2 diabetes mellitus with diabetic autonomic (poly)neuropathy: Secondary | ICD-10-CM

## 2019-10-27 ENCOUNTER — Other Ambulatory Visit: Payer: Self-pay | Admitting: Family Medicine

## 2019-11-09 ENCOUNTER — Ambulatory Visit (INDEPENDENT_AMBULATORY_CARE_PROVIDER_SITE_OTHER): Payer: Medicare Other | Admitting: Family Medicine

## 2019-11-09 ENCOUNTER — Other Ambulatory Visit: Payer: Self-pay

## 2019-11-09 ENCOUNTER — Encounter: Payer: Self-pay | Admitting: Family Medicine

## 2019-11-09 VITALS — BP 82/51 | HR 90 | Temp 98.2°F | Ht 66.0 in | Wt 205.0 lb

## 2019-11-09 DIAGNOSIS — I10 Essential (primary) hypertension: Secondary | ICD-10-CM

## 2019-11-09 DIAGNOSIS — Z79899 Other long term (current) drug therapy: Secondary | ICD-10-CM | POA: Diagnosis not present

## 2019-11-09 DIAGNOSIS — E11610 Type 2 diabetes mellitus with diabetic neuropathic arthropathy: Secondary | ICD-10-CM

## 2019-11-09 DIAGNOSIS — E1149 Type 2 diabetes mellitus with other diabetic neurological complication: Secondary | ICD-10-CM

## 2019-11-09 DIAGNOSIS — F320 Major depressive disorder, single episode, mild: Secondary | ICD-10-CM | POA: Diagnosis not present

## 2019-11-09 DIAGNOSIS — E1143 Type 2 diabetes mellitus with diabetic autonomic (poly)neuropathy: Secondary | ICD-10-CM

## 2019-11-09 LAB — BAYER DCA HB A1C WAIVED: HB A1C (BAYER DCA - WAIVED): 7.5 % — ABNORMAL HIGH (ref <7.0)

## 2019-11-09 MED ORDER — ALPRAZOLAM 0.5 MG PO TABS: 0.5000 mg | ORAL_TABLET | ORAL | 2 refills | Status: DC | PRN

## 2019-11-09 MED ORDER — METFORMIN HCL ER 500 MG PO TB24: 1000.0000 mg | ORAL_TABLET | Freq: Two times a day (BID) | ORAL | 3 refills | Status: DC

## 2019-11-09 NOTE — Progress Notes (Signed)
BP (!) 82/51   Pulse 90   Temp 98.2 F (36.8 C)   Ht 5' 6"  (1.676 m)   Wt 205 lb (93 kg)   SpO2 95%   BMI 33.09 kg/m    Subjective:   [name], female    DOB: [birth year], 74 y.o.   MRN: [id number]  HPI: [name] is a 74 y.o. female presenting on 11/09/2019 for Medical Management of Chronic Issues and Diabetes   HPI Type 2 diabetes mellitus Patient comes in today for recheck of his diabetes. Patient has been currently taking Jardiance and Ozempic and Metformin and glipizide. Patient is currently on an ACE inhibitor/ARB. Patient has not seen an ophthalmologist this year. Patient denies any issues with their feet. The symptom started onset as an adult hypertension and dyslipidemia and neuropathy has been ARE RELATED TO DM   Hypertension Patient is currently on fish oil and Zetia and Lipitor, and their blood pressure today is 82/51. Patient denies any lightheadedness or dizziness. Patient denies headaches, blurred vision, chest pains, shortness of breath, or weakness. Denies any side effects from medication and is content with current medication.   Depression Patient is currently on Lexapro and Abilify and alprazolam for helping with insomnia and anxiety depression. Current rx-alprazolam 0.5 mg nightly as needed # meds rx-30 Effectiveness of current meds-works well, does not use every day Adverse reactions form meds-none  Pill count performed-No Last drug screen -09/21/2019 ( high risk q75m moderate risk q645mlow risk yearly ) Urine drug screen today- No [place]  If yes were their any concerning findings? -None  No flowsheet data found.   Controlled substance contract signed on: 09/21/2019  Relevant past medical, surgical, family and social history reviewed and updated as indicated. Interim medical history since our last visit reviewed. Allergies and medications reviewed and updated.  Review of Systems  Constitutional: Negative for  chills and fever.  Eyes: Negative for visual disturbance.  Respiratory: Negative for chest tightness and shortness of breath.   Cardiovascular: Negative for chest pain and leg swelling.  Musculoskeletal: Negative for back pain and gait problem.  Skin: Negative for rash.  Neurological: Positive for numbness. Negative for light-headedness and headaches.  Psychiatric/Behavioral: Positive for sleep disturbance. Negative for agitation, behavioral problems, dysphoric mood, self-injury and suicidal ideas. The patient is nervous/anxious.   All other systems reviewed and are negative.   Per HPI unless specifically indicated above   Allergies as of 11/09/2019      Reactions   Penicillins Other (See Comments)   Blisters in mouth Did it involve swelling of the face/tongue/throat, SOB, or low BP? No Did it involve sudden or severe rash/hives, skin peeling, or any reaction on the inside of your mouth or nose? No Did you need to seek medical attention at a hospital or doctor's office? No When did it last happen?Childhood allergy If all above answers are "NO", may proceed with cephalosporin use.   Codeine Nausea And Vomiting   Erythromycin Nausea And Vomiting   Fenofibrate Other (See Comments)   aching & edema    Lyrica [pregabalin] Other (See Comments)   Edema in feet   Statins Other (See Comments)   Joints ache   Sulfa Antibiotics Nausea And Vomiting      Medication List       Accurate as of November 09, 2019 11:54 AM. If you have any questions, ask your nurse or doctor.        ALPRAZolam  0.5 MG tablet Commonly known as: XANAX Take 1 tablet (0.5 mg total) by mouth as needed for anxiety. Take 1-2 tabs daily as needed Start taking on: November 29, 2019 What changed: These instructions start on November 29, 2019. If you are unsure what to do until then, ask your doctor or other care provider. Changed by: Fransisca Kaufmann Anja Neuzil, MD   ARIPiprazole 2 MG tablet Commonly known as: ABILIFY Take 1  tablet (2 mg total) by mouth daily.   atorvastatin 10 MG tablet Commonly known as: LIPITOR Take 1 tablet (10 mg total) by mouth daily.   blood glucose meter kit and supplies Kit Dispense based on patient and insurance preference. Use up to two times daily as directed. (Dx: type 2 DM - E11.9)   clopidogrel 75 MG tablet Commonly known as: PLAVIX TAKE 1 TABLET DAILY   escitalopram 20 MG tablet Commonly known as: LEXAPRO Take 1 tablet (20 mg total) by mouth daily.   ezetimibe 10 MG tablet Commonly known as: ZETIA Take 1 tablet (10 mg total) by mouth daily.   Fish Oil 1000 MG Caps Take by mouth. Take 2 pills daily   gabapentin 600 MG tablet Commonly known as: NEURONTIN Take 1 tablet (600 mg total) by mouth 4 (four) times daily.   HYDROcodone-acetaminophen 5-325 MG tablet Commonly known as: NORCO/VICODIN Take 1 tablet by mouth every 6 (six) hours as needed for moderate pain.   Jardiance 10 MG Tabs tablet Generic drug: empagliflozin TAKE (1) TABLET DAILY BE- FORE BREAKFAST.   lisinopril-hydrochlorothiazide 20-25 MG tablet Commonly known as: ZESTORETIC Take 1 tablet by mouth daily.   LUTEIN 20 PO Take by mouth daily.   metFORMIN 500 MG 24 hr tablet Commonly known as: GLUCOPHAGE-XR Take 2 tablets (1,000 mg total) by mouth in the morning and at bedtime. What changed: when to take this Changed by: Fransisca Kaufmann Luzmaria Devaux, MD   Nicorelief 2 MG gum Generic drug: nicotine polacrilex Take 2 mg by mouth daily.   ONE TOUCH ULTRA 2 w/Device Kit CHECK BLOOD SUGAR UP TO TWICE DAILY Dx K48.1   OneTouch Delica Lancets 85U Misc CHECK BLOOD SUGAR UP TO TWICE DAILY Dx E11.9   OneTouch Ultra test strip Generic drug: glucose blood Check BS up to 2 times daily Dx 11.43   Ozempic (1 MG/DOSE) 4 MG/3ML Sopn Generic drug: Semaglutide (1 MG/DOSE) INJECT 1 MG ONCE A WEEK AS DIRECTED   pantoprazole 40 MG tablet Commonly known as: PROTONIX TAKE 1 TABLET DAILY   Vitamin D 50 MCG (2000  UT) tablet Take 2,000 Units by mouth daily.        Objective:   BP (!) 82/51   Pulse 90   Temp 98.2 F (36.8 C)   Ht 5' 6"  (1.676 m)   Wt 205 lb (93 kg)   SpO2 95%   BMI 33.09 kg/m   Wt Readings from Last 3 Encounters:  11/09/19 205 lb (93 kg)  09/21/19 203 lb (92.1 kg)  08/03/19 206 lb 6 oz (93.6 kg)    Physical Exam Vitals and nursing note reviewed.  Constitutional:      General: She is not in acute distress.    Appearance: She is well-developed. She is not diaphoretic.  Eyes:     Conjunctiva/sclera: Conjunctivae normal.  Cardiovascular:     Rate and Rhythm: Normal rate and regular rhythm.     Heart sounds: Normal heart sounds. No murmur heard.   Pulmonary:     Effort: Pulmonary effort is normal. No  respiratory distress.     Breath sounds: Normal breath sounds. No wheezing.  Musculoskeletal:        General: No tenderness. Normal range of motion.  Skin:    General: Skin is warm and dry.     Findings: No rash.  Neurological:     Mental Status: She is alert and oriented to person, place, and time.     Coordination: Coordination normal.  Psychiatric:        Behavior: Behavior normal.       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  No change in medication for now, continue alprazolam.  Follow up plan: Return if symptoms worsen or fail to improve, for Diabetes recheck, 1 month appointment with Almyra Free.  Counseling provided for all of the vaccine components Orders Placed This Encounter  Procedures  . Bayer [facility] Hb A1c [place], MD [facility] 11/09/2019, 11:54 AM

## 2019-11-14 ENCOUNTER — Telehealth: Payer: Self-pay | Admitting: Family Medicine

## 2019-11-14 NOTE — Telephone Encounter (Signed)
Pt questioned if there was a time released Metformin that may be helpful. She is staying sick to her stomach and having frequent diarrhea. Patients sugars are down but cannot keep taking 4 Metformin per day.  She is going to go back to 2 per day. Scheduled an appt with Almyra Free 11/15/2019. Pt is aware that Dr. Warrick Parisian will not be in the office until Thursday to review.

## 2019-11-15 ENCOUNTER — Other Ambulatory Visit: Payer: Self-pay

## 2019-11-15 ENCOUNTER — Encounter: Payer: Self-pay | Admitting: Pharmacist

## 2019-11-15 ENCOUNTER — Ambulatory Visit (INDEPENDENT_AMBULATORY_CARE_PROVIDER_SITE_OTHER): Payer: Medicare Other | Admitting: Pharmacist

## 2019-11-15 ENCOUNTER — Other Ambulatory Visit: Payer: Self-pay | Admitting: Family Medicine

## 2019-11-15 VITALS — BP 119/70 | HR 90

## 2019-11-15 DIAGNOSIS — E1122 Type 2 diabetes mellitus with diabetic chronic kidney disease: Secondary | ICD-10-CM | POA: Diagnosis not present

## 2019-11-15 DIAGNOSIS — N183 Chronic kidney disease, stage 3 unspecified: Secondary | ICD-10-CM

## 2019-11-15 MED ORDER — GLIPIZIDE ER 5 MG PO TB24: 5.0000 mg | ORAL_TABLET | Freq: Every day | ORAL | 2 refills | Status: DC

## 2019-11-15 NOTE — Progress Notes (Signed)
    11/15/2019 Name: [name] MRN: [id number] DOB: 1945/05/17   S:   89 yoF presents for diabetes evaluation, education, and management Patient was referred and last seen by Primary Care Provider on 11/09/19.  Insurance coverage/medication affordability: BCBS medicare  Patient reports adherence with medications. . Current diabetes medications include: Ozempic, Jardiance, metformin . Current hypertension medications include: lisinopril/hctz Goal 130/80 . Current hyperlipidemia medications include:zetia, atorvastatin   Patient denies hypoglycemic events.   Patient reported dietary habits: Eats 3 meals/day Discussed meal planning options and Plate method for healthy eating . Avoid sugary drinks and desserts . Incorporate balanced protein, non starchy veggies, 1 serving of carbohydrate with each meal . Increase water intake . Increase physical activity as able  Patient-reported exercise habits: n/a   O:  Lab Results  Component Value Date   HGBA1C 7.5 (H) 11/09/2019    Vitals:   11/15/19 1128  BP: 119/70  Pulse: 90       Lipid Panel     Component Value Date/Time   CHOL 137 05/04/2019 0550   CHOL 149 10/06/2018 0804   CHOL 223 (H) 10/21/2012 0827   TRIG 137 05/04/2019 0550   TRIG 158 (H) 09/03/2016 0817   TRIG 367 (H) 10/21/2012 0827   HDL 36 (L) 05/04/2019 0550   HDL 50 10/06/2018 0804   HDL 48 09/03/2016 0817   HDL 40 10/21/2012 0827   CHOLHDL 3.8 05/04/2019 0550   VLDL 27 05/04/2019 0550   LDLCALC 74 05/04/2019 0550   LDLCALC 72 10/06/2018 0804   LDLCALC 74 11/28/2013 0855   LDLCALC 110 (H) 10/21/2012 0827     Home fasting blood sugars:  124 124 148 102 172 106 114 170  2 hour post-meal/random blood sugars: 170 (after dessert).   A/P: Diabetes currentlyuncontrolled. Patient is able to verbalize appropriate hypoglycemia management plan. Patientisadherent with medication. Control is suboptimal due to diet/unable to exercise.  -CONTINUE  GLP-1 once weekly -WILL CONTINUE OZEMPIC 1mg  sq weekly  -CONTINUE SGLT2 (Jardiance) -INCREASE to 25mg  daily               -DECREASE METFORMIN back down to 1 tablet twice daily due to GI side effects.  Encouraged patient to titrate metformin as she is able to take it.  It has been helping her sure   -STARTED glipizide XL 5mg  with breakfast, patient may have to increase to 5mg  twice daily; will adjust based on sugars   -Patient does not wish to add insulin at this time --may have to add in the future if not controlled  -Extensively discussed pathophysiology of diabetes, recommended lifestyle interventions, dietary effects on blood sugar control.  -Counseled on s/sx of and management of hypoglycemia  -Next A1C anticipated 3 months   Written patient instructions provided. Total time in face to face counseling 106minutes.     Follow up PCP Clinic Visit ON 12/25/19  Regina Eck, PharmD, BCPS Clinical Pharmacist, [place]  [facility] Phone [phone]

## 2019-11-16 NOTE — Telephone Encounter (Signed)
Patient had an appointment with Almyra Free yesterday Almyra Free made the changes to Metformin extended release and the glipizide.

## 2019-11-21 ENCOUNTER — Institutional Professional Consult (permissible substitution): Payer: Medicare Other | Admitting: Neurology

## 2019-12-16 ENCOUNTER — Other Ambulatory Visit: Payer: Self-pay | Admitting: Family Medicine

## 2019-12-25 ENCOUNTER — Other Ambulatory Visit: Payer: Self-pay

## 2019-12-25 ENCOUNTER — Encounter: Payer: Self-pay | Admitting: Family Medicine

## 2019-12-25 ENCOUNTER — Ambulatory Visit (INDEPENDENT_AMBULATORY_CARE_PROVIDER_SITE_OTHER): Payer: Medicare Other | Admitting: Family Medicine

## 2019-12-25 VITALS — BP 101/61 | HR 78 | Temp 98.0°F | Ht 66.0 in | Wt 203.0 lb

## 2019-12-25 DIAGNOSIS — E1122 Type 2 diabetes mellitus with diabetic chronic kidney disease: Secondary | ICD-10-CM | POA: Diagnosis not present

## 2019-12-25 DIAGNOSIS — Z780 Asymptomatic menopausal state: Secondary | ICD-10-CM

## 2019-12-25 DIAGNOSIS — E1143 Type 2 diabetes mellitus with diabetic autonomic (poly)neuropathy: Secondary | ICD-10-CM | POA: Diagnosis not present

## 2019-12-25 DIAGNOSIS — N183 Chronic kidney disease, stage 3 unspecified: Secondary | ICD-10-CM

## 2019-12-25 DIAGNOSIS — I10 Essential (primary) hypertension: Secondary | ICD-10-CM

## 2019-12-25 DIAGNOSIS — E11610 Type 2 diabetes mellitus with diabetic neuropathic arthropathy: Secondary | ICD-10-CM | POA: Diagnosis not present

## 2019-12-25 DIAGNOSIS — E1149 Type 2 diabetes mellitus with other diabetic neurological complication: Secondary | ICD-10-CM | POA: Diagnosis not present

## 2019-12-25 LAB — BAYER DCA HB A1C WAIVED: HB A1C (BAYER DCA - WAIVED): 6.6 % (ref <7.0)

## 2019-12-25 MED ORDER — PANTOPRAZOLE SODIUM 40 MG PO TBEC: 40.0000 mg | DELAYED_RELEASE_TABLET | Freq: Every day | ORAL | 3 refills | Status: DC

## 2019-12-25 MED ORDER — SYNJARDY XR 25-1000 MG PO TB24: 1.0000 | ORAL_TABLET | Freq: Every day | ORAL | 5 refills | Status: DC

## 2019-12-25 NOTE — Progress Notes (Signed)
BP 101/61   Pulse 78   Temp 98 F (36.7 C)   Ht 5' 6"  (1.676 m)   Wt 203 lb (92.1 kg)   SpO2 96%   BMI 32.77 kg/m    Subjective:   [name], female    DOB: May 04, 1945, 74 y.o.   MRN: [id number]  HPI: [name] is a 74 y.o. female presenting on 12/25/2019 for Medical Management of Chronic Issues and Diabetes   HPI Type 2 diabetes mellitus Patient comes in today for recheck of his diabetes. Patient has been currently taking Ozempic, was placed on Jardiance, also takes Metformin, ran out of the [place].. Patient is currently on an ACE inhibitor/ARB. Patient has not seen an ophthalmologist this year. Patient denies any new issues with their feet. The symptom started onset as an adult history of CVA and hypertension and hyperlipidemia and neuropathy ARE RELATED TO DM   Hypertension Patient is currently on lisinopril hydrochlorothiazide, and their blood pressure today is 101/61. Patient denies any lightheadedness or dizziness. Patient denies headaches, blurred vision, chest pains, shortness of breath, or weakness. Denies any side effects from medication and is content with current medication.   Hyperlipidemia Patient is coming in for recheck of his hyperlipidemia. The patient is currently taking Zetia and atorvastatin and fish oil. They deny any issues with myalgias or history of liver damage from it. They deny any focal numbness or weakness or chest pain.   Relevant past medical, surgical, family and social history reviewed and updated as indicated. Interim medical history since our last visit reviewed. Allergies and medications reviewed and updated.  Review of Systems  Constitutional: Negative for chills and fever.  Eyes: Negative for visual disturbance.  Respiratory: Negative for chest tightness and shortness of breath.   Cardiovascular: Negative for chest pain and leg swelling.  Musculoskeletal: Negative for back pain and gait problem.  Skin: Negative for  rash.  Neurological: Negative for light-headedness and headaches.  Psychiatric/Behavioral: Negative for agitation and behavioral problems.  All other systems reviewed and are negative.   Per HPI unless specifically indicated above   Allergies as of 12/25/2019      Reactions   Penicillins Other (See Comments)   Blisters in mouth Did it involve swelling of the face/tongue/throat, SOB, or low BP? No Did it involve sudden or severe rash/hives, skin peeling, or any reaction on the inside of your mouth or nose? No Did you need to seek medical attention at a hospital or doctor's office? No When did it last happen?Childhood allergy If all above answers are "NO", may proceed with cephalosporin use.   Codeine Nausea And Vomiting   Erythromycin Nausea And Vomiting   Fenofibrate Other (See Comments)   aching & edema    Lyrica [pregabalin] Other (See Comments)   Edema in feet   Statins Other (See Comments)   Joints ache   Sulfa Antibiotics Nausea And Vomiting      Medication List       Accurate as of December 25, 2019  2:12 PM. If you have any questions, ask your nurse or doctor.        STOP taking these medications   Jardiance 10 MG Tabs tablet Generic drug: empagliflozin Stopped by: Fransisca Kaufmann Arianni Gallego, MD   metFORMIN 500 MG 24 hr tablet Commonly known as: GLUCOPHAGE-XR Stopped by: Fransisca Kaufmann Tara Rud, MD     TAKE these medications   ALPRAZolam 0.5 MG tablet Commonly known as: XANAX Take 1 tablet (0.5  mg total) by mouth as needed for anxiety. Take 1-2 tabs daily as needed   ARIPiprazole 2 MG tablet Commonly known as: ABILIFY Take 1 tablet (2 mg total) by mouth daily.   atorvastatin 10 MG tablet Commonly known as: LIPITOR Take 1 tablet (10 mg total) by mouth daily.   blood glucose meter kit and supplies Kit Dispense based on patient and insurance preference. Use up to two times daily as directed. (Dx: type 2 DM - E11.9)   clopidogrel 75 MG tablet Commonly known  as: PLAVIX TAKE 1 TABLET DAILY   escitalopram 20 MG tablet Commonly known as: LEXAPRO Take 1 tablet (20 mg total) by mouth daily.   ezetimibe 10 MG tablet Commonly known as: ZETIA Take 1 tablet (10 mg total) by mouth daily.   Fish Oil 1000 MG Caps Take by mouth. Take 2 pills daily   gabapentin 600 MG tablet Commonly known as: NEURONTIN Take 1 tablet (600 mg total) by mouth 4 (four) times daily.   glipiZIDE 5 MG 24 hr tablet Commonly known as: GLUCOTROL XL Take 1 tablet (5 mg total) by mouth daily with breakfast.   HYDROcodone-acetaminophen 5-325 MG tablet Commonly known as: NORCO/VICODIN Take 1 tablet by mouth every 6 (six) hours as needed for moderate pain.   lisinopril-hydrochlorothiazide 20-25 MG tablet Commonly known as: ZESTORETIC Take 1 tablet by mouth daily.   LUTEIN 20 PO Take by mouth daily.   Nicorelief 2 MG gum Generic drug: nicotine polacrilex Take 2 mg by mouth daily.   ONE TOUCH ULTRA 2 w/Device Kit CHECK BLOOD SUGAR UP TO TWICE DAILY Dx U88.2   OneTouch Delica Lancets 80K Misc CHECK BLOOD SUGAR UP TO TWICE DAILY Dx E11.9   OneTouch Ultra test strip Generic drug: glucose blood Check BS up to 2 times daily Dx 11.43   Ozempic (1 MG/DOSE) 4 MG/3ML Sopn Generic drug: Semaglutide (1 MG/DOSE) INJECT 1 MG ONCE A WEEK AS DIRECTED   pantoprazole 40 MG tablet Commonly known as: PROTONIX Take 1 tablet (40 mg total) by mouth daily.   Synjardy XR 25-1000 MG Tb24 Generic drug: Empagliflozin-metFORMIN HCl ER Take 1 tablet by mouth daily. Started by: Worthy Rancher, MD   Vitamin D 50 MCG (2000 UT) tablet Take 2,000 Units by mouth daily.        Objective:   BP 101/61   Pulse 78   Temp 98 F (36.7 C)   Ht 5' 6"  (1.676 m)   Wt 203 lb (92.1 kg)   SpO2 96%   BMI 32.77 kg/m   Wt Readings from Last 3 Encounters:  12/25/19 203 lb (92.1 kg)  11/09/19 205 lb (93 kg)  09/21/19 203 lb (92.1 kg)    Physical Exam Vitals and nursing note  reviewed.  Constitutional:      General: She is not in acute distress.    Appearance: She is well-developed. She is not diaphoretic.  Eyes:     Conjunctiva/sclera: Conjunctivae normal.  Cardiovascular:     Rate and Rhythm: Normal rate and regular rhythm.     Heart sounds: Normal heart sounds. No murmur heard.   Pulmonary:     Effort: Pulmonary effort is normal. No respiratory distress.     Breath sounds: Normal breath sounds. No wheezing.  Musculoskeletal:        General: No tenderness. Normal range of motion.  Skin:    General: Skin is warm and dry.     Findings: No rash.  Neurological:  Mental Status: She is alert and oriented to person, place, and time.     Coordination: Coordination normal.  Psychiatric:        Behavior: Behavior normal.       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  Increase the dose of the Jardiance to 25 mg, keep the Metformin at thousand XR daily Follow up plan: Return in about 3 months (around 03/25/2020), or if symptoms  worsen or fail to improve, for Diabetes recheck, do 1 month with Almyra Free as well.  Counseling provided for all of the vaccine components Orders Placed This Encounter  Procedures  . DG [facility] DEXA  . Bayer [facility] Hb A1c [place], MD [facility] 12/25/2019, 2:12 PM

## 2020-01-03 DIAGNOSIS — Z0120 Encounter for dental examination and cleaning without abnormal findings: Secondary | ICD-10-CM | POA: Diagnosis not present

## 2020-01-15 ENCOUNTER — Other Ambulatory Visit: Payer: Self-pay | Admitting: Family Medicine

## 2020-01-24 ENCOUNTER — Other Ambulatory Visit: Payer: Self-pay

## 2020-01-24 ENCOUNTER — Ambulatory Visit (INDEPENDENT_AMBULATORY_CARE_PROVIDER_SITE_OTHER): Payer: Medicare Other | Admitting: Pharmacist

## 2020-01-24 ENCOUNTER — Ambulatory Visit (INDEPENDENT_AMBULATORY_CARE_PROVIDER_SITE_OTHER): Payer: Medicare Other

## 2020-01-24 DIAGNOSIS — Z780 Asymptomatic menopausal state: Secondary | ICD-10-CM | POA: Diagnosis not present

## 2020-01-24 DIAGNOSIS — Z23 Encounter for immunization: Secondary | ICD-10-CM

## 2020-01-24 DIAGNOSIS — E119 Type 2 diabetes mellitus without complications: Secondary | ICD-10-CM

## 2020-01-24 NOTE — Progress Notes (Signed)
    01/24/2020 Name: [name] MRN: [id number] DOB: 12/26/1945   S:   30 yoF presents for diabetes evaluation, education, and management Patient was referred and last seen by Primary Care Provider on 12/25/19. Patient is doing great and has made tremendous progress with her blood sugars  Insurance coverage/medication affordability: [place]  Patient reports adherence with medications.  Current diabetes medications include: Ozempic, Jardiance, metformin  Current hypertension medications include: lisinopril/hctz Goal 130/80  Current hyperlipidemia medications include:zetia, atorvastatin   Patient denies hypoglycemic events.   Patient reported dietary habits: Eats 3 meals/day Discussed meal planning options and Plate method for healthy eating  Avoid sugary drinks and desserts  Incorporate balanced protein, non starchy veggies, 1 serving of carbohydrate with each meal  Increase water intake  Increase physical activity as able  Patient-reported exercise habits: n/a    O:  Lab Results  Component Value Date   HGBA1C 6.6 12/25/2019    There were no vitals filed for this visit.     Lipid Panel     Component Value Date/Time   CHOL 137 05/04/2019 0550   CHOL 149 10/06/2018 0804   CHOL 223 (H) 10/21/2012 0827   TRIG 137 05/04/2019 0550   TRIG 158 (H) 09/03/2016 0817   TRIG 367 (H) 10/21/2012 0827   HDL 36 (L) 05/04/2019 0550   HDL 50 10/06/2018 0804   HDL 48 09/03/2016 0817   HDL 40 10/21/2012 0827   CHOLHDL 3.8 05/04/2019 0550   VLDL 27 05/04/2019 0550   LDLCALC 74 05/04/2019 0550   LDLCALC 72 10/06/2018 0804   LDLCALC 74 11/28/2013 0855   LDLCALC 110 (H) 10/21/2012 0827    Home fasting blood sugars: 100-130  2 hour post-meal/random blood sugars: <175.    A/P:  Diabetes currentlyCONTROLLED (A1c 7.8 --> 6.6%). Patient is able to verbalize appropriate hypoglycemia management plan. Patientisadherent with medication. Control is suboptimal due  to diet/unable to exercise.  -CONTINUE GLP-1 once weekly -WILL CONTINUE OZEMPIC1mg  sq weekly  -CONTINUE SGLT2/metformin--Synjardy/met  Patient is doing well and stable on this medication  -CONTINUE glipizide xl 5mg  with breakfast  -Extensively discussed pathophysiology of diabetes, recommended lifestyle interventions, dietary effects on blood sugar control  -Counseled on s/sx of and management of hypoglycemia  -Next A1C anticipated 03/25/20.    Written patient instructions provided.  Total time in face to face counseling 25 minutes.   Follow up PCP Clinic Visit ON 03/25/20.    Regina Eck, PharmD, BCPS Clinical Pharmacist, [place]  [facility] Phone [phone]

## 2020-01-25 DIAGNOSIS — M85852 Other specified disorders of bone density and structure, left thigh: Secondary | ICD-10-CM | POA: Diagnosis not present

## 2020-01-25 DIAGNOSIS — Z780 Asymptomatic menopausal state: Secondary | ICD-10-CM | POA: Diagnosis not present

## 2020-02-07 DIAGNOSIS — M5416 Radiculopathy, lumbar region: Secondary | ICD-10-CM | POA: Diagnosis not present

## 2020-02-07 DIAGNOSIS — Z79899 Other long term (current) drug therapy: Secondary | ICD-10-CM | POA: Diagnosis not present

## 2020-02-09 ENCOUNTER — Other Ambulatory Visit: Payer: Self-pay | Admitting: Family Medicine

## 2020-03-08 ENCOUNTER — Other Ambulatory Visit: Payer: Self-pay | Admitting: Family Medicine

## 2020-03-25 ENCOUNTER — Other Ambulatory Visit: Payer: Self-pay

## 2020-03-25 ENCOUNTER — Ambulatory Visit (INDEPENDENT_AMBULATORY_CARE_PROVIDER_SITE_OTHER): Payer: Medicare Other | Admitting: Family Medicine

## 2020-03-25 ENCOUNTER — Encounter: Payer: Self-pay | Admitting: Family Medicine

## 2020-03-25 VITALS — BP 127/71 | HR 86 | Ht 66.0 in | Wt 208.0 lb

## 2020-03-25 DIAGNOSIS — M797 Fibromyalgia: Secondary | ICD-10-CM

## 2020-03-25 DIAGNOSIS — E11610 Type 2 diabetes mellitus with diabetic neuropathic arthropathy: Secondary | ICD-10-CM

## 2020-03-25 DIAGNOSIS — E1142 Type 2 diabetes mellitus with diabetic polyneuropathy: Secondary | ICD-10-CM | POA: Diagnosis not present

## 2020-03-25 DIAGNOSIS — F320 Major depressive disorder, single episode, mild: Secondary | ICD-10-CM | POA: Diagnosis not present

## 2020-03-25 DIAGNOSIS — I10 Essential (primary) hypertension: Secondary | ICD-10-CM

## 2020-03-25 DIAGNOSIS — E1143 Type 2 diabetes mellitus with diabetic autonomic (poly)neuropathy: Secondary | ICD-10-CM

## 2020-03-25 DIAGNOSIS — Z79899 Other long term (current) drug therapy: Secondary | ICD-10-CM

## 2020-03-25 LAB — BAYER DCA HB A1C WAIVED: HB A1C (BAYER DCA - WAIVED): 6.8 % (ref <7.0)

## 2020-03-25 MED ORDER — ONETOUCH ULTRA 2 W/DEVICE KIT: PACK | 0 refills | Status: AC

## 2020-03-25 NOTE — Addendum Note (Signed)
Addended by: Caryl Pina on: 03/25/2020 12:13 PM   Modules accepted: Orders

## 2020-03-25 NOTE — Progress Notes (Signed)
BP 127/71   Pulse 86   Ht 5' 6"  (1.676 m)   Wt 208 lb (94.3 kg)   SpO2 99%   BMI 33.57 kg/m    Subjective:   [name], female    DOB: 01/07/1946, 74 y.o.   MRN: [id number]  HPI: [name] is a 74 y.o. female presenting on 03/25/2020 for Diabetes   HPI Type 2 diabetes mellitus Patient comes in today for recheck of his diabetes. Patient has been currently taking Synjardy and glipizide and Ozempic. Patient is currently on an ACE inhibitor/ARB. Patient has seen an ophthalmologist this year. Patient denies any new issues with their feet. The symptom started onset as an adult hypertension and neuropathy ARE RELATED TO DM   Hypertension Patient is currently on lisinopril hydrochlorothiazide, and their blood pressure today is 127/71. Patient denies any lightheadedness or dizziness. Patient denies headaches, blurred vision, chest pains, shortness of breath, or weakness. Denies any side effects from medication and is content with current medication.   Patient has fibromyalgia and takes Abilify and Lexapro and says that they are doing okay for her and her mood is stable.  Patient denies any suicidal ideations or thoughts of hurting himself.  Relevant past medical, surgical, family and social history reviewed and updated as indicated. Interim medical history since our last visit reviewed. Allergies and medications reviewed and updated.  Review of Systems  Constitutional: Negative for chills and fever.  Eyes: Negative for visual disturbance.  Respiratory: Negative for chest tightness and shortness of breath.   Cardiovascular: Negative for chest pain and leg swelling.  Musculoskeletal: Positive for myalgias. Negative for back pain and gait problem.  Skin: Negative for rash.  Neurological: Positive for numbness. Negative for dizziness, weakness, light-headedness and headaches.  Psychiatric/Behavioral: Negative for agitation and behavioral problems.  All other systems  reviewed and are negative.   Per HPI unless specifically indicated above   Allergies as of 03/25/2020      Reactions   Penicillins Other (See Comments)   Blisters in mouth Did it involve swelling of the face/tongue/throat, SOB, or low BP? No Did it involve sudden or severe rash/hives, skin peeling, or any reaction on the inside of your mouth or nose? No Did you need to seek medical attention at a hospital or doctor's office? No When did it last happen?Childhood allergy If all above answers are "NO", may proceed with cephalosporin use.   Codeine Nausea And Vomiting   Erythromycin Nausea And Vomiting   Fenofibrate Other (See Comments)   aching & edema    Lyrica [pregabalin] Other (See Comments)   Edema in feet   Statins Other (See Comments)   Joints ache   Sulfa Antibiotics Nausea And Vomiting      Medication List       Accurate as of March 25, 2020 11:39 AM. If you have any questions, ask your nurse or doctor.        ALPRAZolam 0.5 MG tablet Commonly known as: XANAX Take 1 tablet (0.5 mg total) by mouth as needed for anxiety. Take 1-2 tabs daily as needed   ARIPiprazole 2 MG tablet Commonly known as: ABILIFY Take 1 tablet (2 mg total) by mouth daily.   atorvastatin 10 MG tablet Commonly known as: LIPITOR Take 1 tablet (10 mg total) by mouth daily.   blood glucose meter kit and supplies Kit Dispense based on patient and insurance preference. Use up to two times daily as directed. (Dx: type 2 DM -  E11.9)   clopidogrel 75 MG tablet Commonly known as: PLAVIX TAKE 1 TABLET DAILY   escitalopram 20 MG tablet Commonly known as: LEXAPRO Take 1 tablet (20 mg total) by mouth daily.   ezetimibe 10 MG tablet Commonly known as: ZETIA Take 1 tablet (10 mg total) by mouth daily.   Fish Oil 1000 MG Caps Take by mouth. Take 2 pills daily   gabapentin 600 MG tablet Commonly known as: NEURONTIN Take 1 tablet (600 mg total) by mouth 4 (four) times daily.    glipiZIDE 5 MG 24 hr tablet Commonly known as: GLUCOTROL XL Take 1 tablet (5 mg total) by mouth daily with breakfast.   HYDROcodone-acetaminophen 5-325 MG tablet Commonly known as: NORCO/VICODIN Take 1 tablet by mouth every 6 (six) hours as needed for moderate pain.   lisinopril-hydrochlorothiazide 20-25 MG tablet Commonly known as: ZESTORETIC Take 1 tablet by mouth daily.   LUTEIN 20 PO Take by mouth daily.   nicotine polacrilex 2 MG gum Commonly known as: NICORETTE Take 2 mg by mouth daily.   ONE TOUCH ULTRA 2 w/Device Kit CHECK BLOOD SUGAR UP TO TWICE DAILY Dx B35.3   OneTouch Delica Lancets 29J Misc CHECK BLOOD SUGAR UP TO TWICE DAILY Dx E11.9   OneTouch Ultra test strip Generic drug: glucose blood Check BS up to 2 times daily Dx 11.43   Ozempic (1 MG/DOSE) 4 MG/3ML Sopn Generic drug: Semaglutide (1 MG/DOSE) INJECT 1 MG ONCE A WEEK AS DIRECTED   pantoprazole 40 MG tablet Commonly known as: PROTONIX Take 1 tablet (40 mg total) by mouth daily.   Synjardy XR 25-1000 MG Tb24 Generic drug: Empagliflozin-metFORMIN HCl ER Take 1 tablet by mouth daily.   Vitamin D 50 MCG (2000 UT) tablet Take 2,000 Units by mouth daily.        Objective:   BP 127/71   Pulse 86   Ht 5' 6"  (1.676 m)   Wt 208 lb (94.3 kg)   SpO2 99%   BMI 33.57 kg/m   Wt Readings from Last 3 Encounters:  03/25/20 208 lb (94.3 kg)  12/25/19 203 lb (92.1 kg)  11/09/19 205 lb (93 kg)    Physical Exam Vitals and nursing note reviewed.  Constitutional:      General: She is not in acute distress.    Appearance: She is well-developed and well-nourished. She is not diaphoretic.  Eyes:     Extraocular Movements: EOM normal.     Conjunctiva/sclera: Conjunctivae normal.  Cardiovascular:     Rate and Rhythm: Normal rate and regular rhythm.     Pulses: Intact distal pulses.     Heart sounds: Normal heart sounds. No murmur heard.   Pulmonary:     Effort: Pulmonary effort is normal. No  respiratory distress.     Breath sounds: Normal breath sounds. No wheezing.  Musculoskeletal:        General: No tenderness or edema. Normal range of motion.  Skin:    General: Skin is warm and dry.     Findings: No rash.  Neurological:     Mental Status: She is alert and oriented to person, place, and time.     Coordination: Coordination normal.  Psychiatric:        Mood and Affect: Mood and affect normal.        Behavior: Behavior normal.       Assessment & Plan:   Problem List Items Addressed This Visit      Cardiovascular and Mediastinum   Essential  hypertension, benign (Chronic)   Relevant Orders   CMP14+EGFR     Endocrine   Diabetes ([place]) - Primary (Chronic)   Relevant Orders   Bayer DCA Hb A1c Waived   CBC with Differential/Platelet   Lipid panel   Vitamin B12   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HCC)   Diabetic autonomic neuropathy associated with type 2 diabetes mellitus ([place])     Other   Fibromyalgia syndrome (Chronic)   Relevant Orders   CBC with Differential/Platelet   Depression    Other Visit Diagnoses    Controlled substance agreement signed          Alprazolam was filled in August and she has not used up any of those prescriptions, still has 2 more there at the pharmacy. Follow up plan: Return in about 3 months (around 06/23/2020), or if symptoms worsen or fail to improve, for Hypertension and diabetes.  Counseling provided for all of the vaccine components Orders Placed This Encounter  Procedures  . Bayer DCA Hb A1c Waived  . CBC with Differential/Platelet  . CMP14+EGFR  . Lipid panel  . Vitamin B12    Caryl Pina, MD [facility] 03/25/2020, 11:39 AM

## 2020-03-26 LAB — CBC WITH DIFFERENTIAL/PLATELET
Basophils Absolute: 0.1 10*3/uL (ref 0.0–0.2)
Basos: 1 %
EOS (ABSOLUTE): 0.2 10*3/uL (ref 0.0–0.4)
Eos: 3 %
Hematocrit: 40.4 % (ref 34.0–46.6)
Hemoglobin: 12.3 g/dL (ref 11.1–15.9)
Immature Grans (Abs): 0 10*3/uL (ref 0.0–0.1)
Immature Granulocytes: 0 %
Lymphocytes Absolute: 2.5 10*3/uL (ref 0.7–3.1)
Lymphs: 33 %
MCH: 23.8 pg — ABNORMAL LOW (ref 26.6–33.0)
MCHC: 30.4 g/dL — ABNORMAL LOW (ref 31.5–35.7)
MCV: 78 fL — ABNORMAL LOW (ref 79–97)
Monocytes Absolute: 0.9 10*3/uL (ref 0.1–0.9)
Monocytes: 12 %
Neutrophils Absolute: 3.9 10*3/uL (ref 1.4–7.0)
Neutrophils: 51 %
Platelets: 325 10*3/uL (ref 150–450)
RBC: 5.17 x10E6/uL (ref 3.77–5.28)
RDW: 16 % — ABNORMAL HIGH (ref 11.7–15.4)
WBC: 7.6 10*3/uL (ref 3.4–10.8)

## 2020-03-26 LAB — CMP14+EGFR
ALT: 23 IU/L (ref 0–32)
AST: 20 IU/L (ref 0–40)
Albumin/Globulin Ratio: 1.7 (ref 1.2–2.2)
Albumin: 4.4 g/dL (ref 3.7–4.7)
Alkaline Phosphatase: 85 IU/L (ref 44–121)
BUN/Creatinine Ratio: 18 (ref 12–28)
BUN: 12 mg/dL (ref 8–27)
Bilirubin Total: 0.3 mg/dL (ref 0.0–1.2)
CO2: 28 mmol/L (ref 20–29)
Calcium: 9.9 mg/dL (ref 8.7–10.3)
Chloride: 99 mmol/L (ref 96–106)
Creatinine, Ser: 0.66 mg/dL (ref 0.57–1.00)
GFR calc Af Amer: 101 mL/min/{1.73_m2} (ref >59)
GFR calc non Af Amer: 87 mL/min/{1.73_m2} (ref >59)
Globulin, Total: 2.6 g/dL (ref 1.5–4.5)
Glucose: 103 mg/dL — ABNORMAL HIGH (ref 65–99)
Potassium: 5 mmol/L (ref 3.5–5.2)
Sodium: 139 mmol/L (ref 134–144)
Total Protein: 7 g/dL (ref 6.0–8.5)

## 2020-03-26 LAB — VITAMIN B12: Vitamin B-12: 525 pg/mL (ref 232–1245)

## 2020-03-26 LAB — LIPID PANEL
Chol/HDL Ratio: 3.1 ratio (ref 0.0–4.4)
Cholesterol, Total: 138 mg/dL (ref 100–199)
HDL: 44 mg/dL (ref >39)
LDL Chol Calc (NIH): 60 mg/dL (ref 0–99)
Triglycerides: 210 mg/dL — ABNORMAL HIGH (ref 0–149)
VLDL Cholesterol Cal: 34 mg/dL (ref 5–40)

## 2020-04-08 ENCOUNTER — Other Ambulatory Visit: Payer: Self-pay | Admitting: Family Medicine

## 2020-04-30 DIAGNOSIS — Z96653 Presence of artificial knee joint, bilateral: Secondary | ICD-10-CM | POA: Diagnosis not present

## 2020-04-30 DIAGNOSIS — M25552 Pain in left hip: Secondary | ICD-10-CM | POA: Diagnosis not present

## 2020-05-07 ENCOUNTER — Ambulatory Visit: Payer: Medicare Other | Attending: Orthopedic Surgery | Admitting: Physical Therapy

## 2020-05-07 ENCOUNTER — Other Ambulatory Visit: Payer: Self-pay

## 2020-05-07 DIAGNOSIS — M25561 Pain in right knee: Secondary | ICD-10-CM | POA: Insufficient documentation

## 2020-05-07 DIAGNOSIS — M25661 Stiffness of right knee, not elsewhere classified: Secondary | ICD-10-CM

## 2020-05-07 DIAGNOSIS — G8929 Other chronic pain: Secondary | ICD-10-CM

## 2020-05-07 NOTE — Therapy (Signed)
[facility] [place], [place], [address] Phone: [phone]   Fax:  [phone]  Physical Therapy Evaluation  Patient Details  Name: [name] MRN: [id number] Date of Birth: [birth year] Referring Provider (PT): Gaynelle Arabian MD   Encounter Date: 05/07/2020   PT End of Session - 05/07/20 1119    Visit Number 1    Number of Visits 8    Date for PT Re-Evaluation 06/04/20    PT Start Time 0945    PT Stop Time 1032    PT Time Calculation (min) 47 min    Activity Tolerance Patient tolerated treatment well    Behavior During Therapy [facility] for tasks assessed/performed           Past Medical History:  Diagnosis Date  . Allergy    seasonal  . Arthritis    [place]   . Colon polyps   . Complication of anesthesia    per pt, she woke up in the middle of last colon in 2014.  . Depression   . Diabetes mellitus, type 2 ([place]) 06/2010  . Diabetic neuropathy ([place])   . Diverticulosis    pt unaware  . Esophageal stricture   . Fibromyalgia    [place]   . GERD (gastroesophageal reflux disease)   . Hemorrhoids   . Hiatal hernia   . Hyperlipidemia   . Hypertension   . Menopause   . Numbness   . Peripheral vascular insufficiency ([place])   . Retinopathy    Bilateral  . TIA (transient ischemic attack)   . Tremor   . Vitamin D deficiency     Past Surgical History:  Procedure Laterality Date  . ABDOMINAL HYSTERECTOMY     partial and then total  . ANKLE SURGERY Right   . COLONOSCOPY    . ELBOW SURGERY     left  . KNEE ARTHROSCOPY Right 09/12/2018   Procedure: ARTHROSCOPY KNEE; SYNOVECTOMY;  Surgeon: Gaynelle Arabian, MD;  Location: [facility];  Service: Orthopedics;  Laterality: Right;  66min  . REFRACTIVE SURGERY  2003  . TOTAL KNEE ARTHROPLASTY     bilateral  . TOTAL KNEE REVISION Right 03/09/2018   Procedure: RIGHT TOTAL KNEE REVISION;  Surgeon: Gaynelle Arabian, MD;  Location: [facility];  Service: Orthopedics;  Laterality:  Right;  158min with abductor block  . VESICOVAGINAL FISTULA CLOSURE W/ TAH  1981    There were no vitals filed for this visit.    Subjective Assessment - 05/07/20 1104    Subjective COVID-19 screen performed prior to patient entering clinic.  The patient presents to the clinic today with a CC of right knee pain that has been ongoing for a couple of years.  She rates her pain at a 7 today with prolonged standing and walking increasing her pain.  She also reports the development of some left hip pain which she relates to the way she walks due to her right knee pain.  She has a h/o LBP.  She states she is considering an internal stimulator.    Pertinent History H/o LBP, bilateral TKA's and revision on right, fibromyalgia, HTN, right ankle surgery.    How long can you stand comfortably? Short time.    How long can you walk comfortably? Short community distance.    Patient Stated Goals Reduce pain and be bale to do more.    Currently in Pain? Yes    Pain Score 7     Pain Location Knee  Pain Orientation Right    Pain Descriptors / Indicators Throbbing;Sharp;Numbness    Pain Type Chronic pain    Pain Onset More than a month ago    Pain Frequency Constant    Aggravating Factors  Standing and walking.    Pain Relieving Factors Rest.              [facility] PT Assessment - 05/07/20 0001      Assessment   Medical Diagnosis Right total knee arthroplasty.    Referring Provider (PT) Gaynelle Arabian MD    Onset Date/Surgical Date --   Couple years.     Precautions   Precaution Comments No ultrasound.      Restrictions   Weight Bearing Restrictions No      Balance Screen   Has the patient fallen in the past 6 months No    Has the patient had a decrease in activity level because of a fear of falling?  Yes    Is the patient reluctant to leave their home because of a fear of falling?  No      Home [employer] residence      Prior Function   Level of  Independence Independent      ROM / Strength   AROM / PROM / Strength AROM;Strength      AROM   Overall AROM Comments Right knee -18 to 120 degrees.  WFL for right hip and left knee.      Strength   Overall Strength Comments Bilateral hip and right knee 4+/5.      Palpation   Palpation comment Tender to palpation at distal lateral right knee and around periphery of right knee.  She is tender in area of her left greater trochanter and glut med and TFL.      Special Tests   Other special tests Decreased right patellar mobility in all directions.      Ambulation/Gait   Gait Comments Antalgic gait with right knee held in flexion.                      Objective measurements completed on examination: See above findings.       [facility] Adult PT Treatment/Exercise - 05/07/20 0001      Modalities   Modalities Electrical Stimulation;Vasopneumatic      Electrical Stimulation   Electrical Stimulation Location Right distal lateral knee    Electrical Stimulation Action Pre-mod.    Electrical Stimulation Parameters 80-150 Hz x 20 minutes.    Electrical Stimulation Goals Pain      Vasopneumatic   Number Minutes Vasopneumatic  20 minutes    Vasopnuematic Location  --   Right knee.   Vasopneumatic Pressure Low                       PT Long Term Goals - 05/07/20 1137      PT LONG TERM GOAL #1   Title Ind with an HEP.    Time 4    Period Weeks    Status New      PT LONG TERM GOAL #2   Title Improve right knee extension by 10 degrees.    Time 4    Period Weeks    Status New      PT LONG TERM GOAL #3   Title Walk a community distance with pain not > 3-4/10 without an assistive device.    Time 4  Period Weeks    Status New      PT LONG TERM GOAL #4   Title Stand 20 minutes with right knee pain not > 4/10.    Time 4    Period Weeks    Status New                  Plan - 05/07/20 1132    Clinical Impression Statement The patient presenst  to OPPT wiht c/o chronic right knee pain.  She lacks extension.  She has pain complaints at her distal lateral knee and around her kneecap.  She also reports tenderness around her left greater trochanter region, TFL and glut med.  Her patellar mobility is decreased.  She also reports some left knee pain which she related to her gait pattern which is antalgic in nature.  Patient will benefit from skilled physical therapy intervention to address pain and deficits.    Personal Factors and Comorbidities Comorbidity 1;Comorbidity 2;Other    Comorbidities H/o LBP, bilateral TKA's and revision on right, fibromyalgia, HTN, right ankle surgery.    Examination-Activity Limitations Locomotion Level;Stand;Other    Examination-Participation Restrictions Other    Stability/Clinical Decision Making Evolving/Moderate complexity    Clinical Decision Making Low    Rehab Potential Good    PT Frequency 2x / week    PT Duration 4 weeks    PT Treatment/Interventions ADLs/Self Care Home Management;Cryotherapy;Electrical Stimulation;Ultrasound;Moist Heat;Iontophoresis 4mg /ml Dexamethasone;Gait training;Stair training;Functional mobility training;Therapeutic activities;Therapeutic exercise;Manual techniques;Patient/family education;Passive range of motion;Dry needling    PT Next Visit Plan Right knee pain-free ther ex and extension stretching, STW/M to right hamstrings and quads.  Modalites (no [place] to knees) PRN.  Right patellar mobility.    Consulted and Agree with Plan of Care Patient           Patient will benefit from skilled therapeutic intervention in order to improve the following deficits and impairments:  Pain,Decreased activity tolerance,Decreased strength,Decreased range of motion  Visit Diagnosis: Chronic pain of right knee - Plan: PT plan of care cert/re-cert  Stiffness of right knee, not elsewhere classified - Plan: PT plan of care cert/re-cert     Problem List Patient Active Problem List    Diagnosis Date Noted  . Paresthesia 06/14/2019  . Diabetic autonomic neuropathy associated with type 2 diabetes mellitus ([place]) 06/14/2019  . Atypical chest pain 05/03/2019  . TIA (transient ischemic attack) 05/03/2019  . Unsteady gait 11/03/2018  . Cerebrovascular accident (CVA) ([place]) 11/03/2018  . Cerebellar ataxia in diseases classified elsewhere ([place]) 11/03/2018  . Failed total knee arthroplasty ([place]) 03/09/2018  . Aortic atherosclerosis ([place]) 12/08/2017  . Hardening of the aorta (main artery of the heart) ([place]) 12/08/2017  . 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[place]) 01/14/2015  . Depression 02/28/2013  . GERD (gastroesophageal reflux disease) 02/28/2013  . Hiatal hernia with gastroesophageal reflux 02/28/2013  . Peripheral neuropathy 02/28/2013  . DDD (degenerative disc disease), lumbosacral 06/23/2012  . Fibromyalgia syndrome 06/23/2012  . Essential hypertension, benign 06/23/2012  . Osteopenia 06/23/2012  . Diabetes ([place]) 06/23/2012  . Abnormal EKG 10/29/2010  . Obesity due to excess calories 10/29/2010  . 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 DYSPHAGIA 10/11/2009  . PERSONAL HISTORY OF COLONIC POLYPS 10/11/2009    Sritha Chauncey, [place] MPT 05/07/2020, 11:40 AM  [facility] [address] [place], [place], [address] Phone: [phone]   Fax:  [phone]  Name: [name] MRN: [id number] Date of Birth: January 19, 1946

## 2020-05-07 NOTE — Patient Instructions (Signed)
[facility]   DRY NEEDLING CONSENT FORM   Trigger point dry needling is a physical therapy approach to treat Myofascial Pain and Dysfunction.  Dry Needling (DN) is a valuable and effective way to deactivate myofascial trigger points (muscle knots). It is skilled intervention that uses a thin filiform needle to penetrate the skin and stimulate underlying myofascial trigger points, muscular, and connective tissues for the management of neuromusculoskeletal pain and movement impairments.  A local twitch response (LTR) will be elicited.  This can sometimes feel like a deep ache in the muscle during the procedure. Multiple trigger points in multiple muscles can be treated during each treatment.  No medication of any kind is injected.   As with any medical treatment and procedure, there are possible adverse events.  While significant adverse events are uncommon, they do sometimes occur and must be considered prior to giving consent.  1. Dry needling often causes a "post needling soreness".  There can be an increase in pain from a couple of hours to 2-3 days, followed by an improvement in the overall pain state. 2. Any time a needle is used there is a risk of infection.  However, we are using new, sterile, and disposable needles; infections are extremely rare. 3. There is a possibility that you may bleed or bruise.  You may feel tired and some nausea following treatment. 4. There is a rare possibility of a pneumothorax (air in the chest cavity). 5. Allergic reaction to nickel in the stainless steel needle. 6. If a nerve is touched, it may cause paresthesia (a prickling/shock sensation) which is usually brief, but may continue for a couple of days.  PLEASE ANSWER THE FOLLOWING QUESTIONS:  Do you have a lack of sensation?   Y/N  Do you have a phobia or fear of needles  Y/N  Are you pregnant?    Y/N If yes:  How many weeks? __________ Do you have any implanted  devices?  Y/N If yes:  Pacemaker/Spinal Cord Stimulator/Deep Brain Stimulator/Insulin Pump/Other: ________________ Do you have any implants?  Y/N If yes: Breast/Facial/Pecs/Buttocks/Calves/Hip  Replacement/ Knee Replacement/Other: _________ Do you take any blood thinners?   Y/N If yes: Coumadin (Warfarin)/Other: ___________________ Do you have a bleeding disorder?   Y/N If yes: What kind: _________________________________ Do you take any immunosuppressants?  Y/N If yes:   What kind: _________________________________ Do you take anti-inflammatories?   Y/N If yes: What kind: Advil/Aspirin/Other: ________________ Have you ever been diagnosed with Scoliosis? Y/N Have you had back surgery?   Y/N If yes:  Laminectomy/Fusion/Other: ___________________   I have read, or had read to me, the above.  I have had the opportunity to ask any questions.  All of my questions have been answered to my satisfaction and I understand the risks involved with dry needling.  I consent to examination and treatment at [facility], including dry needling, of any and all of my involved and affected muscles.  Patient Signature: ________________________________________   Date: _______________________

## 2020-05-08 ENCOUNTER — Other Ambulatory Visit: Payer: Self-pay | Admitting: Family Medicine

## 2020-05-08 DIAGNOSIS — F320 Major depressive disorder, single episode, mild: Secondary | ICD-10-CM

## 2020-05-08 DIAGNOSIS — Z79899 Other long term (current) drug therapy: Secondary | ICD-10-CM

## 2020-05-09 ENCOUNTER — Other Ambulatory Visit: Payer: Self-pay | Admitting: Family Medicine

## 2020-05-09 DIAGNOSIS — Z79891 Long term (current) use of opiate analgesic: Secondary | ICD-10-CM | POA: Diagnosis not present

## 2020-05-09 DIAGNOSIS — M5416 Radiculopathy, lumbar region: Secondary | ICD-10-CM | POA: Diagnosis not present

## 2020-05-09 DIAGNOSIS — M5136 Other intervertebral disc degeneration, lumbar region: Secondary | ICD-10-CM | POA: Diagnosis not present

## 2020-05-09 DIAGNOSIS — M5137 Other intervertebral disc degeneration, lumbosacral region: Secondary | ICD-10-CM | POA: Diagnosis not present

## 2020-05-09 NOTE — Telephone Encounter (Signed)
Patient last seen in office on 03/25/20. Currently has controlled substance agreement on file. Xanax was not refilled at Dec appt because the note says she had not been using it often. Please review and advise on RF

## 2020-05-10 ENCOUNTER — Ambulatory Visit: Payer: Medicare Other | Admitting: Physical Therapy

## 2020-05-10 ENCOUNTER — Other Ambulatory Visit: Payer: Self-pay

## 2020-05-10 DIAGNOSIS — M25661 Stiffness of right knee, not elsewhere classified: Secondary | ICD-10-CM | POA: Diagnosis not present

## 2020-05-10 DIAGNOSIS — M25561 Pain in right knee: Secondary | ICD-10-CM | POA: Diagnosis not present

## 2020-05-10 DIAGNOSIS — G8929 Other chronic pain: Secondary | ICD-10-CM | POA: Diagnosis not present

## 2020-05-10 NOTE — Patient Instructions (Signed)
[facility]  DRY NEEDLING CONSENT FORM   Trigger point dry needling is a physical therapy approach to treat Myofascial Pain and Dysfunction.  Dry Needling (DN) is a valuable and effective way to deactivate myofascial trigger points (muscle knots/pain). It is skilled intervention that uses a thin filiform needle to penetrate the skin and stimulate underlying myofascial trigger points, muscular, and connective tissues for the management of neuromusculoskeletal pain and movement impairments.  A local twitch response (LTR) will be elicited.  This can sometimes feel like a deep ache in the muscle during the procedure. Multiple trigger points in multiple muscles can be treated during each treatment.  No medication of any kind is injected.   As with any medical treatment and procedure, there are possible adverse events.  While significant adverse events are uncommon, they do sometimes occur and must be considered prior to giving consent.  1. Dry needling often causes a "post needling soreness".  There can be an increase in pain from a couple of hours to 2-3 days, followed by an improvement in the overall pain state. 2. Any time a needle is used there is a risk of infection.  However, we are using new, sterile, and disposable needles; infections are extremely rare. 3. There is a possibility that you may bleed or bruise.  You may feel tired and some nausea following treatment. 4. There is a rare possibility of a pneumothorax (air in the chest cavity). 5. Allergic reaction to nickel in the stainless steel needle. 6. If a nerve is touched, it may cause paresthesia (a prickling/shock sensation) which is usually brief, but may continue for a couple of days.  Following treatment stay hydrated.  Continue regular activities but not too vigorous initially after treatment for 24-48 hours.  Dry Needling is best when combined with other physical therapy interventions such as  strengthening, stretching and other therapeutic modalities.   PLEASE ANSWER THE FOLLOWING QUESTIONS:  Do you have a lack of sensation?   Y/N  Do you have a phobia or fear of needles  Y/N  Are you pregnant?    Y/N If yes:  How many weeks? __________ Do you have any implanted devices?  Y/N If yes:  Pacemaker/Spinal Cord Stimulator/Deep Brain Stimulator/Insulin Pump/Other: ________________ Do you have any implants?  Y/N If yes: Breast/Facial/Pecs/Buttocks/Calves/Hip  Replacement/ Knee Replacement/Other: _________ Do you take any blood thinners?   Y/N If yes: Coumadin (Warfarin)/Other: ___________________ Do you have a bleeding disorder?   Y/N If yes: What kind: _________________________________ Do you take any immunosuppressants?  Y/N If yes:   What kind: _________________________________ Do you take anti-inflammatories?   Y/N If yes: What kind: Advil/Aspirin/Other: ________________ Have you ever been diagnosed with Scoliosis? Y/N Have you had back surgery?   Y/N If yes:  Laminectomy/Fusion/Other: ___________________   I have read, or had read to me, the above.  I have had the opportunity to ask any questions.  All of my questions have been answered to my satisfaction and I understand the risks involved with dry needling.  I consent to examination and treatment at [facility], including dry needling, of any and all of my involved and affected muscles.     Signature: __________________________________     Date:___________________________________________________

## 2020-05-10 NOTE — Therapy (Signed)
[facility] [place], [place], [address] Phone: [phone]   Fax:  [phone]  Physical Therapy Treatment  Patient Details  Name: [name] MRN: [id number] Date of Birth: [birth year] Referring Provider (PT): Gaynelle Arabian MD   Encounter Date: 05/10/2020   PT End of Session - 05/10/20 1256    Visit Number 2    Number of Visits 8    Date for PT Re-Evaluation 06/04/20    PT Start Time 4235    PT Stop Time 1203    PT Time Calculation (min) 47 min    Activity Tolerance Patient tolerated treatment well    Behavior During Therapy [facility] for tasks assessed/performed           Past Medical History:  Diagnosis Date  . Allergy    seasonal  . Arthritis    [place]   . Colon polyps   . Complication of anesthesia    per pt, she woke up in the middle of last colon in 2014.  . Depression   . Diabetes mellitus, type 2 ([place]) 06/2010  . Diabetic neuropathy ([place])   . Diverticulosis    pt unaware  . Esophageal stricture   . Fibromyalgia    [place]   . GERD (gastroesophageal reflux disease)   . Hemorrhoids   . Hiatal hernia   . Hyperlipidemia   . Hypertension   . Menopause   . Numbness   . Peripheral vascular insufficiency ([place])   . Retinopathy    [place]  . TIA (transient ischemic attack)   . Tremor   . Vitamin D deficiency     Past Surgical History:  Procedure Laterality Date  . ABDOMINAL HYSTERECTOMY     partial and then total  . ANKLE SURGERY Right   . COLONOSCOPY    . ELBOW SURGERY     left  . KNEE ARTHROSCOPY Right 09/12/2018   Procedure: ARTHROSCOPY KNEE; SYNOVECTOMY;  Surgeon: Gaynelle Arabian, MD;  Location: [facility];  Service: Orthopedics;  Laterality: Right;  79min  . REFRACTIVE SURGERY  2003  . TOTAL KNEE ARTHROPLASTY     bilateral  . TOTAL KNEE REVISION Right 03/09/2018   Procedure: RIGHT TOTAL KNEE REVISION;  Surgeon: Gaynelle Arabian, MD;  Location[facility];  Service: Orthopedics;  Laterality:  Right;  165min with abductor block  . VESICOVAGINAL FISTULA CLOSURE W/ TAH  1981    There were no vitals filed for this visit.                      [facility] Adult PT Treatment/Exercise - 05/10/20 0001      Vasopneumatic   Number Minutes Vasopneumatic  17 minutes    Vasopnuematic Location  --   Left knee.   Vasopneumatic Pressure Medium      Manual Therapy   Manual Therapy Soft tissue mobilization    Soft tissue mobilization STW/M x 23 minutes to patient's left quadriceps to decrease tone.                       PT Long Term Goals - 05/07/20 1137      PT LONG TERM GOAL #1   Title Ind with an HEP.    Time 4    Period Weeks    Status New      PT LONG TERM GOAL #2   Title Improve right knee extension by 10 degrees.    Time 4    Period  Weeks    Status New      PT LONG TERM GOAL #3   Title Walk a community distance with pain not > 3-4/10 without an assistive device.    Time 4    Period Weeks    Status New      PT LONG TERM GOAL #4   Title Stand 20 minutes with right knee pain not > 4/10.    Time 4    Period Weeks    Status New                 Plan - 05/10/20 1256    Clinical Impression Statement Patient did well with treatment today.  She had trigger points especially over her lateral quads.  Provided patient with a consent form for dry needling.    Personal Factors and Comorbidities Comorbidity 1;Comorbidity 2;Other    Comorbidities H/o LBP, bilateral TKA's and revision on right, fibromyalgia, HTN, right ankle surgery.    Examination-Activity Limitations Locomotion Level;Stand;Other    Examination-Participation Restrictions Other    Stability/Clinical Decision Making Evolving/Moderate complexity    Rehab Potential Good    PT Frequency 2x / week    PT Duration 4 weeks    PT Treatment/Interventions ADLs/Self Care Home Management;Cryotherapy;Electrical Stimulation;Ultrasound;Moist Heat;Iontophoresis 4mg /ml Dexamethasone;Gait  training;Stair training;Functional mobility training;Therapeutic activities;Therapeutic exercise;Manual techniques;Patient/family education;Passive range of motion;Dry needling    PT Next Visit Plan Right knee pain-free ther ex and extension stretching, STW/M to right hamstrings and quads.  Modalites (no [place] to knees) PRN.  Right patellar mobility.    Consulted and Agree with Plan of Care Patient           Patient will benefit from skilled therapeutic intervention in order to improve the following deficits and impairments:  Pain,Decreased activity tolerance,Decreased strength,Decreased range of motion  Visit Diagnosis: Chronic pain of right knee  Stiffness of right knee, not elsewhere classified     Problem List Patient Active Problem List   Diagnosis Date Noted  . Paresthesia 06/14/2019  . Diabetic autonomic neuropathy associated with type 2 diabetes mellitus ([place]) 06/14/2019  . Atypical chest pain 05/03/2019  . TIA (transient ischemic attack) 05/03/2019  . Unsteady gait 11/03/2018  . Cerebrovascular accident (CVA) ([place]) 11/03/2018  . Cerebellar ataxia in diseases classified elsewhere ([place]) 11/03/2018  . 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 Aortic atherosclerosis ([place]) 12/08/2017  . Hardening of the aorta (main artery of the heart) ([place]) 12/08/2017  . 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[place]) 01/14/2015  . Depression 02/28/2013  . GERD (gastroesophageal reflux disease) 02/28/2013  . Hiatal hernia with gastroesophageal reflux 02/28/2013  . Peripheral neuropathy 02/28/2013  . DDD (degenerative disc disease), lumbosacral 06/23/2012  . Fibromyalgia syndrome 06/23/2012  . Essential hypertension, benign 06/23/2012  . Osteopenia 06/23/2012  . Diabetes ([place]) 06/23/2012  . Abnormal EKG 10/29/2010  . Obesity due to excess calories 10/29/2010  . 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 DYSPHAGIA 10/11/2009  . PERSONAL HISTORY OF COLONIC POLYPS 10/11/2009     Vallorie Niccoli, [place] MPT 05/10/2020, 12:59 PM  [facility] [address] [place], [place], [address] Phone: [phone]   Fax:  [phone]  Name: [name] MRN: [id number] Date of Birth: Aug 23, 1945

## 2020-05-13 ENCOUNTER — Other Ambulatory Visit: Payer: Self-pay | Admitting: Family Medicine

## 2020-05-13 DIAGNOSIS — Z79899 Other long term (current) drug therapy: Secondary | ICD-10-CM

## 2020-05-13 DIAGNOSIS — F320 Major depressive disorder, single episode, mild: Secondary | ICD-10-CM

## 2020-05-14 ENCOUNTER — Other Ambulatory Visit: Payer: Self-pay

## 2020-05-14 ENCOUNTER — Ambulatory Visit: Payer: Medicare Other | Admitting: Physical Therapy

## 2020-05-14 DIAGNOSIS — M25661 Stiffness of right knee, not elsewhere classified: Secondary | ICD-10-CM

## 2020-05-14 DIAGNOSIS — G8929 Other chronic pain: Secondary | ICD-10-CM | POA: Diagnosis not present

## 2020-05-14 DIAGNOSIS — M25561 Pain in right knee: Secondary | ICD-10-CM | POA: Diagnosis not present

## 2020-05-14 NOTE — Therapy (Signed)
[facility] [place], [place], [address] Phone: [phone]   Fax:  [phone]  Physical Therapy Treatment  Patient Details  Name: [name] MRN: [id number] Date of Birth: 04/26/1945 Referring Provider (PT): Gaynelle Arabian MD   Encounter Date: 05/14/2020   PT End of Session - 05/14/20 1128    Visit Number 3    Number of Visits 8    Date for PT Re-Evaluation 06/04/20    PT Start Time 0945    PT Stop Time 1022    PT Time Calculation (min) 37 min    Activity Tolerance Patient tolerated treatment well    Behavior During Therapy [facility] for tasks assessed/performed           Past Medical History:  Diagnosis Date  . Allergy    seasonal  . Arthritis    [place]   . Colon polyps   . Complication of anesthesia    per pt, she woke up in the middle of last colon in 2014.  . Depression   . Diabetes mellitus, type 2 ([place]) 06/2010  . Diabetic neuropathy ([place])   . Diverticulosis    pt unaware  . Esophageal stricture   . Fibromyalgia    [place]   . GERD (gastroesophageal reflux disease)   . Hemorrhoids   . Hiatal hernia   . Hyperlipidemia   . Hypertension   . Menopause   . Numbness   . Peripheral vascular insufficiency ([place])   . Retinopathy    Bilateral  . TIA (transient ischemic attack)   . Tremor   . Vitamin D deficiency     Past Surgical History:  Procedure Laterality Date  . ABDOMINAL HYSTERECTOMY     partial and then total  . ANKLE SURGERY Right   . COLONOSCOPY    . ELBOW SURGERY     left  . KNEE ARTHROSCOPY Right 09/12/2018   Procedure: ARTHROSCOPY KNEE; SYNOVECTOMY;  Surgeon: Gaynelle Arabian, MD;  Location: [facility];  Service: Orthopedics;  Laterality: Right;  2min  . REFRACTIVE SURGERY  2003  . TOTAL KNEE ARTHROPLASTY     bilateral  . TOTAL KNEE REVISION Right 03/09/2018   Procedure: RIGHT TOTAL KNEE REVISION;  Surgeon: Gaynelle Arabian, MD;  Location: [facility];  Service: Orthopedics;  Laterality:  Right;  151min with abductor block  . VESICOVAGINAL FISTULA CLOSURE W/ TAH  1981    There were no vitals filed for this visit.   Subjective Assessment - 05/14/20 1109    Subjective COVID-19 screen performed prior to patient entering clinic.    Pertinent History H/o LBP, bilateral TKA's and revision on right, fibromyalgia, HTN, right ankle surgery.    How long can you stand comfortably? Short time.    How long can you walk comfortably? Short community distance.    Patient Stated Goals Reduce pain and be bale to do more.    Currently in Pain? Yes    Pain Score 6     Pain Location Knee    Pain Orientation Right    Pain Descriptors / Indicators Throbbing;Sharp;Numbness    Pain Type Chronic pain    Pain Onset More than a month ago                             [facility] Adult PT Treatment/Exercise - 05/14/20 0001      Exercises   Exercises Knee/Hip      Knee/Hip Exercises: Supine  Short Arc [employer] Limitations 20 minutes facilitated with [place] e'stim with 5 sec extension holds      Manual Therapy   Manual Therapy Soft tissue mobilization    Soft tissue mobilization STW/M x 9 minutes to patient's right quadriceps.                       PT Long Term Goals - 05/07/20 1137      PT LONG TERM GOAL #1   Title Ind with an HEP.    Time 4    Period Weeks    Status New      PT LONG TERM GOAL #2   Title Improve right knee extension by 10 degrees.    Time 4    Period Weeks    Status New      PT LONG TERM GOAL #3   Title Walk a community distance with pain not > 3-4/10 without an assistive device.    Time 4    Period Weeks    Status New      PT LONG TERM GOAL #4   Title Stand 20 minutes with right knee pain not > 4/10.    Time 4    Period Weeks    Status New                  Patient will benefit from skilled therapeutic intervention in order to improve the following deficits and impairments:     Visit Diagnosis: Chronic pain of  right knee  Stiffness of right knee, not elsewhere classified     Problem List Patient Active Problem List   Diagnosis Date Noted  . Paresthesia 06/14/2019  . Diabetic autonomic neuropathy associated with type 2 diabetes mellitus ([place]) 06/14/2019  . Atypical chest pain 05/03/2019  . TIA (transient ischemic attack) 05/03/2019  . Unsteady gait 11/03/2018  . Cerebrovascular accident (CVA) ([place]) 11/03/2018  . Cerebellar ataxia in diseases classified elsewhere ([place]) 11/03/2018  . Failed total knee arthroplasty ([place]) 03/09/2018  . Aortic atherosclerosis ([place]) 12/08/2017  . Hardening of the aorta (main artery of the heart) ([place]) 12/08/2017  . 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[place]) 01/14/2015  . Depression 02/28/2013  . GERD (gastroesophageal reflux disease) 02/28/2013  . Hiatal hernia with gastroesophageal reflux 02/28/2013  . Peripheral neuropathy 02/28/2013  . DDD (degenerative disc disease), lumbosacral 06/23/2012  . Fibromyalgia syndrome 06/23/2012  . Essential hypertension, benign 06/23/2012  . Osteopenia 06/23/2012  . Diabetes ([place]) 06/23/2012  . Abnormal EKG 10/29/2010  . Obesity due to excess calories 10/29/2010  . 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 DYSPHAGIA 10/11/2009  . PERSONAL HISTORY OF COLONIC POLYPS 10/11/2009    Sangita Zani, [place] MPT 05/14/2020, 11:29 AM  [facility] [address] [place], [place], [address] Phone: [phone]   Fax:  [phone]  Name: [name] MRN: [id number] Date of Birth: [birth year]

## 2020-05-16 ENCOUNTER — Ambulatory Visit: Payer: Medicare Other | Admitting: Physical Therapy

## 2020-05-16 ENCOUNTER — Other Ambulatory Visit: Payer: Self-pay

## 2020-05-16 DIAGNOSIS — G8929 Other chronic pain: Secondary | ICD-10-CM

## 2020-05-16 DIAGNOSIS — M25661 Stiffness of right knee, not elsewhere classified: Secondary | ICD-10-CM

## 2020-05-16 DIAGNOSIS — M25561 Pain in right knee: Secondary | ICD-10-CM | POA: Diagnosis not present

## 2020-05-16 NOTE — Therapy (Signed)
[facility] [place], [place], [address] Phone: [phone]   Fax:  [phone]  Physical Therapy Treatment  Patient Details  Name: [name] MRN: [id number] Date of Birth: Dec 21, 1945 Referring Provider (PT): Gaynelle Arabian MD   Encounter Date: 05/16/2020   PT End of Session - 05/16/20 1115    Visit Number 4    Number of Visits 8    Date for PT Re-Evaluation 06/04/20    PT Start Time 0948    PT Stop Time 1034    PT Time Calculation (min) 46 min    Activity Tolerance Patient tolerated treatment well    Behavior During Therapy [facility] for tasks assessed/performed           Past Medical History:  Diagnosis Date  . Allergy    seasonal  . Arthritis    [place]   . Colon polyps   . Complication of anesthesia    per pt, she woke up in the middle of last colon in 2014.  . Depression   . Diabetes mellitus, type 2 ([place]) 06/2010  . Diabetic neuropathy ([place])   . Diverticulosis    pt unaware  . Esophageal stricture   . Fibromyalgia    [place]   . GERD (gastroesophageal reflux disease)   . Hemorrhoids   . Hiatal hernia   . Hyperlipidemia   . Hypertension   . Menopause   . Numbness   . Peripheral vascular insufficiency ([place])   . Retinopathy    Bilateral  . TIA (transient ischemic attack)   . Tremor   . Vitamin D deficiency     Past Surgical History:  Procedure Laterality Date  . ABDOMINAL HYSTERECTOMY     partial and then total  . ANKLE SURGERY Right   . COLONOSCOPY    . ELBOW SURGERY     left  . KNEE ARTHROSCOPY Right 09/12/2018   Procedure: ARTHROSCOPY KNEE; SYNOVECTOMY;  Surgeon: Gaynelle Arabian, MD;  Location: [facility];  Service: Orthopedics;  Laterality: Right;  57min  . REFRACTIVE SURGERY  2003  . TOTAL KNEE ARTHROPLASTY     bilateral  . TOTAL KNEE REVISION Right 03/09/2018   Procedure: RIGHT TOTAL KNEE REVISION;  Surgeon: Gaynelle Arabian, MD;  Location: [facility];  Service: Orthopedics;  Laterality:  Right;  138min with abductor block  . VESICOVAGINAL FISTULA CLOSURE W/ TAH  1981    There were no vitals filed for this visit.   Subjective Assessment - 05/16/20 1115    Subjective COVID-19 screen performed prior to patient entering clinic.  About the same.                             [facility] Adult PT Treatment/Exercise - 05/16/20 0001      Exercises   Exercises Knee/Hip      Knee/Hip Exercises: Aerobic   Stationary Bike Level 3 x 5 minutes.    Nustep Level 3 x 10 minutes.      Modalities   Modalities Electrical Stimulation;Moist Research officer, political party Location RT quadriceps    Electrical Stimulation Action IFC 80-150 Hz    Electrical Stimulation Parameters 40% scan x 20 minutes.    Electrical Stimulation Goals Pain;Tone      Manual Therapy   Manual Therapy Soft tissue mobilization    Soft tissue mobilization STW/M x 8 minutes to patient right quadriceps to decrease toneand pain.  PT Long Term Goals - 05/07/20 1137      PT LONG TERM GOAL #1   Title Ind with an HEP.    Time 4    Period Weeks    Status New      PT LONG TERM GOAL #2   Title Improve right knee extension by 10 degrees.    Time 4    Period Weeks    Status New      PT LONG TERM GOAL #3   Title Walk a community distance with pain not > 3-4/10 without an assistive device.    Time 4    Period Weeks    Status New      PT LONG TERM GOAL #4   Title Stand 20 minutes with right knee pain not > 4/10.    Time 4    Period Weeks    Status New                 Plan - 05/16/20 1118    Clinical Impression Statement Patietn did great with ther ex today with no pain increase.    Personal Factors and Comorbidities Comorbidity 1;Comorbidity 2;Other    Comorbidities H/o LBP, bilateral TKA's and revision on right, fibromyalgia, HTN, right ankle surgery.    Examination-Activity Limitations Locomotion Level;Stand;Other     Examination-Participation Restrictions Other    Stability/Clinical Decision Making Evolving/Moderate complexity    Rehab Potential Good    PT Frequency 2x / week    PT Duration 4 weeks    PT Treatment/Interventions ADLs/Self Care Home Management;Cryotherapy;Electrical Stimulation;Ultrasound;Moist Heat;Iontophoresis 4mg /ml Dexamethasone;Gait training;Stair training;Functional mobility training;Therapeutic activities;Therapeutic exercise;Manual techniques;Patient/family education;Passive range of motion;Dry needling    Consulted and Agree with Plan of Care Patient           Patient will benefit from skilled therapeutic intervention in order to improve the following deficits and impairments:  Pain,Decreased activity tolerance,Decreased strength,Decreased range of motion  Visit Diagnosis: Chronic pain of right knee  Stiffness of right knee, not elsewhere classified     Problem List Patient Active Problem List   Diagnosis Date Noted  . Paresthesia 06/14/2019  . Diabetic autonomic neuropathy associated with type 2 diabetes mellitus ([place]) 06/14/2019  . Atypical chest pain 05/03/2019  . TIA (transient ischemic attack) 05/03/2019  . Unsteady gait 11/03/2018  . Cerebrovascular accident (CVA) ([place]) 11/03/2018  . Cerebellar ataxia in diseases classified elsewhere ([place]) 11/03/2018  . Failed total knee arthroplasty ([place]) 03/09/2018  . Aortic atherosclerosis ([place]) 12/08/2017  . Hardening of the aorta (main artery of the heart) ([place]) 12/08/2017  . 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[place]) 01/14/2015  . Depression 02/28/2013  . GERD (gastroesophageal reflux disease) 02/28/2013  . Hiatal hernia with gastroesophageal reflux 02/28/2013  . Peripheral neuropathy 02/28/2013  . DDD (degenerative disc disease), lumbosacral 06/23/2012  . Fibromyalgia syndrome 06/23/2012  . Essential hypertension, benign 06/23/2012  . Osteopenia 06/23/2012  . Diabetes ([place]) 06/23/2012  . Abnormal EKG 10/29/2010  .  Obesity due to excess calories 10/29/2010  . 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 DYSPHAGIA 10/11/2009  . PERSONAL HISTORY OF COLONIC POLYPS 10/11/2009    Emanuelle Bastos, [place] MPT 05/16/2020, 11:23 AM  [facility] [address] [place], [place], [address] Phone: [phone]   Fax:  [phone]  Name: NEVIA HENKIN MRN: [id number] Date of Birth: 04-02-1946

## 2020-05-17 ENCOUNTER — Ambulatory Visit: Payer: Medicare Other | Admitting: Family Medicine

## 2020-05-17 ENCOUNTER — Encounter: Payer: Self-pay | Admitting: Family Medicine

## 2020-05-17 VITALS — BP 115/61 | HR 92 | Ht 66.0 in | Wt 206.0 lb

## 2020-05-17 DIAGNOSIS — F320 Major depressive disorder, single episode, mild: Secondary | ICD-10-CM | POA: Diagnosis not present

## 2020-05-17 DIAGNOSIS — Z79899 Other long term (current) drug therapy: Secondary | ICD-10-CM | POA: Diagnosis not present

## 2020-05-17 MED ORDER — ALPRAZOLAM 0.5 MG PO TABS: 0.5000 mg | ORAL_TABLET | ORAL | 2 refills | Status: DC | PRN

## 2020-05-17 NOTE — Progress Notes (Signed)
BP 115/61   Pulse 92   Ht _0  (1.676 m)   Wt 206 lb (93.4 kg)   SpO2 97%   BMI 33.25 kg/m    Subjective:   [name], female    DOB: 08/29/1945, 75 y.o.   MRN: [id number]  HPI: [name] is a 75 y.o. female presenting on 05/17/2020 for Medical Management of Chronic Issues and Anxiety (Refill xanax )   HPI anxiety recheck patient is coming in today for anxiety recheck. She says a common source for her anxiety is her pain when it builds up to a certain level. Current rx-Xanax 0.5 mg nightly as needed # meds rx-30 Effectiveness of current meds-works well, she does not use it consistently, only uses it infrequently. Adverse reactions form meds-none currently  Pill count performed-No Last drug screen -09/27/2019 ( high risk q59m moderate risk q620mlow risk yearly ) Urine drug screen today- No Was the [place]-yes  If yes were their any concerning findings? -None, she does not take it every day so her prescriptions do last longer than a month  No flowsheet data found.   Controlled substance contract signed on: 09/27/2019  Relevant past medical, surgical, family and social history reviewed and updated as indicated. Interim medical history since our last visit reviewed. Allergies and medications reviewed and updated.  Review of Systems  Constitutional: Negative for chills and fever.  HENT: Negative for ear pain.   Eyes: Negative for visual disturbance.  Respiratory: Negative for chest tightness and shortness of breath.   Cardiovascular: Negative for chest pain and leg swelling.  Skin: Negative for rash.  Neurological: Negative for light-headedness and headaches.  Psychiatric/Behavioral: Positive for sleep disturbance. Negative for agitation, behavioral problems, self-injury and suicidal ideas. The patient is nervous/anxious.   All other systems reviewed and are negative.   Per HPI unless specifically indicated above   Allergies as of 05/17/2020       Reactions   Penicillins Other (See Comments)   Blisters in mouth Did it involve swelling of the face/tongue/throat, SOB, or low BP? No Did it involve sudden or severe rash/hives, skin peeling, or any reaction on the inside of your mouth or nose? No Did you need to seek medical attention at a hospital or doctor's office? No When did it last happen?Childhood allergy If all above answers are "NO", may proceed with cephalosporin use.   Codeine Nausea And Vomiting   Erythromycin Nausea And Vomiting   Fenofibrate Other (See Comments)   aching & edema    Lyrica [pregabalin] Other (See Comments)   Edema in feet   Statins Other (See Comments)   Joints ache   Sulfa Antibiotics Nausea And Vomiting      Medication List       Accurate as of May 17, 2020 11:52 AM. If you have any questions, ask your nurse or doctor.        ALPRAZolam 0.5 MG tablet Commonly known as: XANAX Take 1 tablet (0.5 mg total) by mouth as needed for anxiety. Take 1-2 tabs daily as needed   ARIPiprazole 2 MG tablet Commonly known as: ABILIFY Take 1 tablet (2 mg total) by mouth daily. What changed: how much to take   atorvastatin 10 MG tablet Commonly known as: LIPITOR Take 1 tablet (10 mg total) by mouth daily.   blood glucose meter kit and supplies Kit Dispense based on patient and insurance preference. Use up to two times daily as directed. (Dx: type  2 DM - E11.9)   clopidogrel 75 MG tablet Commonly known as: PLAVIX TAKE 1 TABLET DAILY   escitalopram 20 MG tablet Commonly known as: LEXAPRO Take 1 tablet (20 mg total) by mouth daily.   ezetimibe 10 MG tablet Commonly known as: ZETIA TAKE 1 TABLET DAILY   Fish Oil 1000 MG Caps Take by mouth. Take 2 pills daily   gabapentin 600 MG tablet Commonly known as: NEURONTIN Take 1 tablet (600 mg total) by mouth 4 (four) times daily.   glipiZIDE 5 MG 24 hr tablet Commonly known as: GLUCOTROL XL Take 1 tablet (5 mg total) by mouth daily  with breakfast.   HYDROcodone-acetaminophen 5-325 MG tablet Commonly known as: NORCO/VICODIN Take 1 tablet by mouth every 6 (six) hours as needed for moderate pain.   lisinopril-hydrochlorothiazide 20-25 MG tablet Commonly known as: ZESTORETIC Take 1 tablet by mouth daily.   LUTEIN 20 PO Take by mouth daily.   nicotine polacrilex 2 MG gum Commonly known as: NICORETTE Take 2 mg by mouth daily.   ONE TOUCH ULTRA 2 w/Device Kit CHECK BLOOD SUGAR UP TO TWICE DAILY Dx O03.5   OneTouch Delica Lancets 59R Misc CHECK BLOOD SUGAR UP TO TWICE DAILY Dx E11.9   OneTouch Ultra test strip Generic drug: glucose blood Check BS up to 2 times daily Dx 11.43   Ozempic (1 MG/DOSE) 4 MG/3ML Sopn Generic drug: Semaglutide (1 MG/DOSE) INJECT 1 MG ONCE A WEEK AS DIRECTED   pantoprazole 40 MG tablet Commonly known as: PROTONIX Take 1 tablet (40 mg total) by mouth daily.   Premarin vaginal cream Generic drug: conjugated estrogens Place 1 Applicatorful vaginally at bedtime as needed.   Synjardy XR 25-1000 MG Tb24 Generic drug: Empagliflozin-metFORMIN HCl ER Take 1 tablet by mouth daily.   Vitamin D 50 MCG (2000 UT) tablet Take 2,000 Units by mouth daily.        Objective:   BP 115/61   Pulse 92   Ht _0  (1.676 m)   Wt 206 lb (93.4 kg)   SpO2 97%   BMI 33.25 kg/m   Wt Readings from Last 3 Encounters:  05/17/20 206 lb (93.4 kg)  03/25/20 208 lb (94.3 kg)  12/25/19 203 lb (92.1 kg)    Physical Exam Vitals and nursing note reviewed.  Constitutional:      General: She is not in acute distress.    Appearance: She is well-developed and well-nourished. She is not diaphoretic.  Eyes:     Extraocular Movements: EOM normal.     Conjunctiva/sclera: Conjunctivae normal.  Cardiovascular:     Pulses: Intact distal pulses.  Musculoskeletal:        General: Tenderness present. No edema.  Skin:    Findings: No rash.  Neurological:     Mental Status: She is alert and oriented to  person, place, and time.     Coordination: Coordination normal.  Psychiatric:        Mood and Affect: Mood is anxious.        Behavior: Behavior normal.        Thought Content: Thought content does not include suicidal ideation. Thought content does not include suicidal plan.       Assessment & Plan:   Problem List Items Addressed This Visit      Other   Depression - Primary   Relevant Medications   ALPRAZolam (XANAX) 0.5 MG tablet    Other Visit Diagnoses    Controlled substance agreement signed  Relevant Medications   ALPRAZolam (XANAX) 0.5 MG tablet      continue current dose, continue to encourage infrequent use. She does state hydrocodone as well and continue to encourage not using them together Follow up plan: Return in about 4 weeks (around 06/14/2020), or if symptoms worsen or fail to improve, for Already has an appointment in 4 weeks.  Counseling provided for all of the vaccine components No orders of the defined types were placed in this encounter.   Caryl Pina, MD [facility] 05/17/2020, 11:52 AM

## 2020-05-21 ENCOUNTER — Ambulatory Visit: Payer: Medicare Other | Admitting: Physical Therapy

## 2020-05-22 ENCOUNTER — Ambulatory Visit: Payer: Medicare Other | Admitting: Physical Therapy

## 2020-05-22 ENCOUNTER — Other Ambulatory Visit: Payer: Self-pay

## 2020-05-22 DIAGNOSIS — M25661 Stiffness of right knee, not elsewhere classified: Secondary | ICD-10-CM

## 2020-05-22 DIAGNOSIS — G8929 Other chronic pain: Secondary | ICD-10-CM | POA: Diagnosis not present

## 2020-05-22 DIAGNOSIS — M25561 Pain in right knee: Secondary | ICD-10-CM | POA: Diagnosis not present

## 2020-05-22 NOTE — Therapy (Signed)
[facility] [place], [place], [address] Phone: [phone]   Fax:  [phone]  Physical Therapy Treatment  Patient Details  Name: [name] MRN: [id number] Date of Birth: 07-29-1945 Referring Provider (PT): Gaynelle Arabian MD   Encounter Date: 05/22/2020   PT End of Session - 05/22/20 1041    Visit Number 5    Number of Visits 8    Date for PT Re-Evaluation 06/04/20    PT Start Time 0947    PT Stop Time 1040    PT Time Calculation (min) 53 min    Activity Tolerance Patient tolerated treatment well    Behavior During Therapy [facility] for tasks assessed/performed           Past Medical History:  Diagnosis Date  . Allergy    seasonal  . Arthritis    [place]   . Colon polyps   . Complication of anesthesia    per pt, she woke up in the middle of last colon in 2014.  . Depression   . Diabetes mellitus, type 2 ([place]) 06/2010  . Diabetic neuropathy ([place])   . Diverticulosis    pt unaware  . Esophageal stricture   . Fibromyalgia    [place]   . GERD (gastroesophageal reflux disease)   . Hemorrhoids   . Hiatal hernia   . Hyperlipidemia   . Hypertension   . Menopause   . Numbness   . Peripheral vascular insufficiency ([place])   . Retinopathy    Bilateral  . TIA (transient ischemic attack)   . Tremor   . Vitamin D deficiency     Past Surgical History:  Procedure Laterality Date  . ABDOMINAL HYSTERECTOMY     partial and then total  . ANKLE SURGERY Right   . COLONOSCOPY    . ELBOW SURGERY     left  . KNEE ARTHROSCOPY Right 09/12/2018   Procedure: ARTHROSCOPY KNEE; SYNOVECTOMY;  Surgeon: Gaynelle Arabian, MD;  Location: [facility];  Service: Orthopedics;  Laterality: Right;  52min  . REFRACTIVE SURGERY  2003  . TOTAL KNEE ARTHROPLASTY     bilateral  . TOTAL KNEE REVISION Right 03/09/2018   Procedure: RIGHT TOTAL KNEE REVISION;  Surgeon: Gaynelle Arabian, MD;  Location: [facility];  Service: Orthopedics;  Laterality:  Right;  174min with abductor block  . VESICOVAGINAL FISTULA CLOSURE W/ TAH  1981    There were no vitals filed for this visit.   Subjective Assessment - 05/22/20 1040    Subjective COVID-19 screen performed prior to patient entering clinic.  Tired but doing okay.  Patient reporting some right hamstring pain today.    Pertinent History H/o LBP, bilateral TKA's and revision on right, fibromyalgia, HTN, right ankle surgery.    How long can you stand comfortably? Short time.    How long can you walk comfortably? Short community distance.    Patient Stated Goals Reduce pain and be bale to do more.    Currently in Pain? Yes    Pain Score 6     Pain Location Knee    Pain Orientation Right    Pain Descriptors / Indicators Numbness    Pain Type Chronic pain    Pain Onset More than a month ago                             [facility] Adult PT Treatment/Exercise - 05/22/20 0001      Exercises  Exercises Knee/Hip      Knee/Hip Exercises: Aerobic   Stationary Bike Level 3 x 10 minutes.      Knee/Hip Exercises: Machines for Strengthening   Cybex Knee Extension 10# x 3 minutes.    Cybex Knee Flexion 30# x 3 minutes.      Vasopneumatic   Number Minutes Vasopneumatic  19 minutes    Vasopnuematic Location  --   Left knee.   Vasopneumatic Pressure Low      Manual Therapy   Manual Therapy Soft tissue mobilization    Soft tissue mobilization ST/W x 7 minutes to patient's right hamstrings.                       PT Long Term Goals - 05/07/20 1137      PT LONG TERM GOAL #1   Title Ind with an HEP.    Time 4    Period Weeks    Status New      PT LONG TERM GOAL #2   Title Improve right knee extension by 10 degrees.    Time 4    Period Weeks    Status New      PT LONG TERM GOAL #3   Title Walk a community distance with pain not > 3-4/10 without an assistive device.    Time 4    Period Weeks    Status New      PT LONG TERM GOAL #4   Title Stand 20  minutes with right knee pain not > 4/10.    Time 4    Period Weeks    Status New                 Plan - 05/22/20 1110    Clinical Impression Statement Added resisted knee extension and hamstring curls.  Excellent technique with no increase in pain.    Personal Factors and Comorbidities Comorbidity 1;Comorbidity 2;Other    Comorbidities H/o LBP, bilateral TKA's and revision on right, fibromyalgia, HTN, right ankle surgery.    Examination-Activity Limitations Locomotion Level;Stand;Other    Examination-Participation Restrictions Other    Stability/Clinical Decision Making Evolving/Moderate complexity    Rehab Potential Good    PT Frequency 2x / week    PT Duration 4 weeks    PT Treatment/Interventions ADLs/Self Care Home Management;Cryotherapy;Electrical Stimulation;Ultrasound;Moist Heat;Iontophoresis 4mg /ml Dexamethasone;Gait training;Stair training;Functional mobility training;Therapeutic activities;Therapeutic exercise;Manual techniques;Patient/family education;Passive range of motion;Dry needling    PT Next Visit Plan Right knee pain-free ther ex and extension stretching, STW/M to right hamstrings and quads.  Modalites (no [place] to knees) PRN.  Right patellar mobility.    Consulted and Agree with Plan of Care Patient           Patient will benefit from skilled therapeutic intervention in order to improve the following deficits and impairments:  Pain,Decreased activity tolerance,Decreased strength,Decreased range of motion  Visit Diagnosis: Chronic pain of right knee  Stiffness of right knee, not elsewhere classified     Problem List Patient Active Problem List   Diagnosis Date Noted  . Paresthesia 06/14/2019  . Diabetic autonomic neuropathy associated with type 2 diabetes mellitus ([place]) 06/14/2019  . Atypical chest pain 05/03/2019  . TIA (transient ischemic attack) 05/03/2019  . Unsteady gait 11/03/2018  . Cerebrovascular accident (CVA) ([place]) 11/03/2018  . Cerebellar  ataxia in diseases classified elsewhere ([place]) 11/03/2018  . Failed total knee arthroplasty ([place]) 03/09/2018  . Aortic atherosclerosis ([place]) 12/08/2017  . Hardening of the aorta (main artery  of the heart) ([place]) 12/08/2017  . 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[place]) 01/14/2015  . Depression 02/28/2013  . GERD (gastroesophageal reflux disease) 02/28/2013  . Hiatal hernia with gastroesophageal reflux 02/28/2013  . Peripheral neuropathy 02/28/2013  . DDD (degenerative disc disease), lumbosacral 06/23/2012  . Fibromyalgia syndrome 06/23/2012  . Essential hypertension, benign 06/23/2012  . Osteopenia 06/23/2012  . Diabetes ([place]) 06/23/2012  . Abnormal EKG 10/29/2010  . Obesity due to excess calories 10/29/2010  . 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 DYSPHAGIA 10/11/2009  . PERSONAL HISTORY OF COLONIC POLYPS 10/11/2009    Cannan Beeck, [place] MPT 05/22/2020, 12:16 PM  [facility] [address] [place], [place], [address] Phone: [phone]   Fax:  [phone]  Name: LESIA MONICA MRN: [id number] Date of Birth: 09/05/1945

## 2020-05-23 ENCOUNTER — Ambulatory Visit: Payer: Medicare Other | Admitting: *Deleted

## 2020-05-23 ENCOUNTER — Other Ambulatory Visit: Payer: Self-pay

## 2020-05-23 DIAGNOSIS — G8929 Other chronic pain: Secondary | ICD-10-CM | POA: Diagnosis not present

## 2020-05-23 DIAGNOSIS — M25561 Pain in right knee: Secondary | ICD-10-CM | POA: Diagnosis not present

## 2020-05-23 DIAGNOSIS — M25661 Stiffness of right knee, not elsewhere classified: Secondary | ICD-10-CM | POA: Diagnosis not present

## 2020-05-23 NOTE — Therapy (Signed)
[facility] [place], [place], [address] Phone: [phone]   Fax:  [phone]  Physical Therapy Treatment  Patient Details  Name: [name] MRN: [id number] Date of Birth: 10/26/1945 Referring Provider (PT): Gaynelle Arabian MD   Encounter Date: 05/23/2020   PT End of Session - 05/23/20 0959    Visit Number 6    Number of Visits 8    Date for PT Re-Evaluation 06/04/20    PT Start Time 0945    PT Stop Time 2831    PT Time Calculation (min) 50 min           Past Medical History:  Diagnosis Date  . Allergy    seasonal  . Arthritis    [place]   . Colon polyps   . Complication of anesthesia    per pt, she woke up in the middle of last colon in 2014.  . Depression   . Diabetes mellitus, type 2 ([place]) 06/2010  . Diabetic neuropathy ([place])   . Diverticulosis    pt unaware  . Esophageal stricture   . Fibromyalgia    [place]   . GERD (gastroesophageal reflux disease)   . Hemorrhoids   . Hiatal hernia   . Hyperlipidemia   . Hypertension   . Menopause   . Numbness   . Peripheral vascular insufficiency ([place])   . Retinopathy    Bilateral  . TIA (transient ischemic attack)   . Tremor   . Vitamin D deficiency     Past Surgical History:  Procedure Laterality Date  . ABDOMINAL HYSTERECTOMY     partial and then total  . ANKLE SURGERY Right   . COLONOSCOPY    . ELBOW SURGERY     left  . KNEE ARTHROSCOPY Right 09/12/2018   Procedure: ARTHROSCOPY KNEE; SYNOVECTOMY;  Surgeon: Gaynelle Arabian, MD;  [facility];  Service: Orthopedics;  Laterality: Right;  85min  . REFRACTIVE SURGERY  2003  . TOTAL KNEE ARTHROPLASTY     bilateral  . TOTAL KNEE REVISION Right 03/09/2018   Procedure: RIGHT TOTAL KNEE REVISION;  Surgeon: Gaynelle Arabian, MD;  [facility];  Service: Orthopedics;  Laterality: Right;  163min with abductor block  . VESICOVAGINAL FISTULA CLOSURE W/ TAH  1981    There were no vitals filed for  this visit.   Subjective Assessment - 05/23/20 0957    Subjective COVID-19 screen performed prior to patient entering clinic. Did ok but sore    Pertinent History H/o LBP, bilateral TKA's and revision on right, fibromyalgia, HTN, right ankle surgery.    How long can you stand comfortably? Short time.    How long can you walk comfortably? Short community distance.    Currently in Pain? Yes    Pain Score 2    6/10 when walking                            [facility] Adult PT Treatment/Exercise - 05/23/20 0001      Knee/Hip Exercises: Aerobic   Stationary Bike Level 3 x 10 minutes.      Knee/Hip Exercises: Machines for Strengthening   Cybex Knee Extension 10# 4 x fatigue    Cybex Knee Flexion 30# 4 x fatigue      Knee/Hip Exercises: Standing   Step Down 3 sets;10 reps;Both;Step Height: 4";Hand Hold: 2   pain free     Vasopneumatic   Number Minutes Vasopneumatic  15  minutes    Vasopnuematic Location  --   Left knee.   Vasopneumatic Pressure Low    Vasopneumatic Temperature  36 for edema      Manual Therapy   Manual Therapy Soft tissue mobilization    Soft tissue mobilization ST/W x 5 inutes to patient's right hamstrings./ quads                       PT Long Term Goals - 05/07/20 1137      PT LONG TERM GOAL #1   Title Ind with an HEP.    Time 4    Period Weeks    Status New      PT LONG TERM GOAL #2   Title Improve right knee extension by 10 degrees.    Time 4    Period Weeks    Status New      PT LONG TERM GOAL #3   Title Walk a community distance with pain not > 3-4/10 without an assistive device.    Time 4    Period Weeks    Status New      PT LONG TERM GOAL #4   Title Stand 20 minutes with right knee pain not > 4/10.    Time 4    Period Weeks    Status New                 Plan - 05/23/20 1027    Clinical Impression Statement Pt arrived today reporting doing ok after last Rx. Rx focused on pain free [place] and CKC exs for  strengthening and did well with mainly fatigue. Normal Vaso response    Personal Factors and Comorbidities Comorbidity 1;Comorbidity 2;Other    Examination-Activity Limitations Locomotion Level;Stand;Other    Rehab Potential Good    PT Frequency 2x / week    PT Duration 4 weeks    PT Treatment/Interventions ADLs/Self Care Home Management;Cryotherapy;Electrical Stimulation;Ultrasound;Moist Heat;Iontophoresis 4mg /ml Dexamethasone;Gait training;Stair training;Functional mobility training;Therapeutic activities;Therapeutic exercise;Manual techniques;Patient/family education;Passive range of motion;Dry needling    PT Next Visit Plan Right knee pain-free ther ex and extension stretching, STW/M to right hamstrings and quads.  Modalites (no [place] to knees) PRN.  Right patellar mobility.    Consulted and Agree with Plan of Care Patient           Patient will benefit from skilled therapeutic intervention in order to improve the following deficits and impairments:  Pain,Decreased activity tolerance,Decreased strength,Decreased range of motion  Visit Diagnosis: Chronic pain of right knee  Stiffness of right knee, not elsewhere classified     Problem List Patient Active Problem List   Diagnosis Date Noted  . Paresthesia 06/14/2019  . Diabetic autonomic neuropathy associated with type 2 diabetes mellitus ([place]) 06/14/2019  . Atypical chest pain 05/03/2019  . TIA (transient ischemic attack) 05/03/2019  . Unsteady gait 11/03/2018  . Cerebrovascular accident (CVA) ([place]) 11/03/2018  . Cerebellar ataxia in diseases classified elsewhere ([place]) 11/03/2018  . Failed total knee arthroplasty ([place]) 03/09/2018  . Aortic atherosclerosis ([place]) 12/08/2017  . Hardening of the aorta (main artery of the heart) ([place]) 12/08/2017  . 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[place]) 01/14/2015  . Depression 02/28/2013  . GERD (gastroesophageal reflux disease) 02/28/2013  . Hiatal hernia with gastroesophageal reflux 02/28/2013   . Peripheral neuropathy 02/28/2013  . DDD (degenerative disc disease), lumbosacral 06/23/2012  . Fibromyalgia syndrome 06/23/2012  . Essential hypertension, benign 06/23/2012  . Osteopenia 06/23/2012  . Diabetes ([place]) 06/23/2012  . Abnormal EKG  10/29/2010  . Obesity due to excess calories 10/29/2010  . 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 DYSPHAGIA 10/11/2009  . PERSONAL HISTORY OF COLONIC POLYPS 10/11/2009    Cassity Christian,CHRIS  PTA 05/23/2020, 3:04 PM  [facility] [place], [place], [address] Phone: [phone]   Fax:  [phone]  Name: [name] MRN: [id number] Date of Birth: [birth year]

## 2020-05-28 ENCOUNTER — Ambulatory Visit: Payer: Medicare Other | Admitting: Physical Therapy

## 2020-05-28 ENCOUNTER — Other Ambulatory Visit: Payer: Self-pay

## 2020-05-28 DIAGNOSIS — G8929 Other chronic pain: Secondary | ICD-10-CM

## 2020-05-28 DIAGNOSIS — M25561 Pain in right knee: Secondary | ICD-10-CM

## 2020-05-28 DIAGNOSIS — M25661 Stiffness of right knee, not elsewhere classified: Secondary | ICD-10-CM

## 2020-05-28 NOTE — Therapy (Signed)
[facility] [place], [place], [address] Phone: [phone]   Fax:  [phone]  Physical Therapy Treatment  Patient Details  Name: [name] MRN: [id number] Date of Birth: December 04, 1945 Referring Provider (PT): Gaynelle Arabian MD   Encounter Date: 05/28/2020   PT End of Session - 05/28/20 1110    Visit Number 7    Number of Visits 8    Date for PT Re-Evaluation 06/04/20    PT Start Time 0946    PT Stop Time 1040    PT Time Calculation (min) 54 min    Activity Tolerance Patient tolerated treatment well    Behavior During Therapy [facility] for tasks assessed/performed           Past Medical History:  Diagnosis Date  . Allergy    seasonal  . Arthritis    [place]   . Colon polyps   . Complication of anesthesia    per pt, she woke up in the middle of last colon in 2014.  . Depression   . Diabetes mellitus, type 2 ([place]) 06/2010  . Diabetic neuropathy ([place])   . Diverticulosis    pt unaware  . Esophageal stricture   . Fibromyalgia    [place]   . GERD (gastroesophageal reflux disease)   . Hemorrhoids   . Hiatal hernia   . Hyperlipidemia   . Hypertension   . Menopause   . Numbness   . Peripheral vascular insufficiency ([place])   . Retinopathy    Bilateral  . TIA (transient ischemic attack)   . Tremor   . Vitamin D deficiency     Past Surgical History:  Procedure Laterality Date  . ABDOMINAL HYSTERECTOMY     partial and then total  . ANKLE SURGERY Right   . COLONOSCOPY    . ELBOW SURGERY     left  . KNEE ARTHROSCOPY Right 09/12/2018   Procedure: ARTHROSCOPY KNEE; SYNOVECTOMY;  Surgeon: Gaynelle Arabian, MD;  [facility];  Service: Orthopedics;  Laterality: Right;  15min  . REFRACTIVE SURGERY  2003  . TOTAL KNEE ARTHROPLASTY     bilateral  . TOTAL KNEE REVISION Right 03/09/2018   Procedure: RIGHT TOTAL KNEE REVISION;  Surgeon: Gaynelle Arabian, MD;  [facility];  Service: Orthopedics;  Laterality:  Right;  121min with abductor block  . VESICOVAGINAL FISTULA CLOSURE W/ TAH  1981    There were no vitals filed for this visit.   Subjective Assessment - 05/28/20 1106    Subjective COVID-19 screen performed prior to patient entering clinic.  Some better.    Pertinent History H/o LBP, bilateral TKA's and revision on right, fibromyalgia, HTN, right ankle surgery.    How long can you stand comfortably? Short time.    How long can you walk comfortably? Short community distance.    Currently in Pain? Yes    Pain Score 2     Pain Orientation Right    Pain Type Chronic pain    Pain Onset More than a month ago                             [facility] Adult PT Treatment/Exercise - 05/28/20 0001      Exercises   Exercises Knee/Hip      Knee/Hip Exercises: Aerobic   Nustep Level 3 x 10 minutes.      Knee/Hip Exercises: Machines for Strengthening   Cybex Knee Extension 10# x 3  minutes.      Moist Heat Therapy   Number Minutes Moist Heat 20 Minutes    Moist Heat Location --   Right quads.     Acupuncturist Location RT quads.    Electrical Stimulation Action IFC at 80-150 Hz.    Electrical Stimulation Parameters 40% scan x 20 minutes.    Electrical Stimulation Goals Pain      Manual Therapy   Manual Therapy Soft tissue mobilization    Soft tissue mobilization STW/M x 10 minutes to patient's right quads.            Trigger Point Dry Needling - 05/28/20 0001    Consent Given? Yes    Education Handout Provided Yes    Muscles Treated Lower Quadrant Rectus femoris   Right proximal Rectus Femoris.                    PT Long Term Goals - 05/07/20 1137      PT LONG TERM GOAL #1   Title Ind with an HEP.    Time 4    Period Weeks    Status New      PT LONG TERM GOAL #2   Title Improve right knee extension by 10 degrees.    Time 4    Period Weeks    Status New      PT LONG TERM GOAL #3   Title Walk a community  distance with pain not > 3-4/10 without an assistive device.    Time 4    Period Weeks    Status New      PT LONG TERM GOAL #4   Title Stand 20 minutes with right knee pain not > 4/10.    Time 4    Period Weeks    Status New                 Plan - 05/28/20 1111    Clinical Impression Statement Patient reporting improvement.  She tolerated dry needling to her right proximal Rectus Femoris without complaint.  Significant reduction in right quad tenderness.    Personal Factors and Comorbidities Comorbidity 1;Comorbidity 2;Other    Comorbidities H/o LBP, bilateral TKA's and revision on right, fibromyalgia, HTN, right ankle surgery.    Examination-Activity Limitations Locomotion Level;Stand;Other    Examination-Participation Restrictions Other    Stability/Clinical Decision Making Evolving/Moderate complexity    Rehab Potential Good    PT Frequency 2x / week    PT Duration 4 weeks    PT Treatment/Interventions ADLs/Self Care Home Management;Cryotherapy;Electrical Stimulation;Ultrasound;Moist Heat;Iontophoresis 4mg /ml Dexamethasone;Gait training;Stair training;Functional mobility training;Therapeutic activities;Therapeutic exercise;Manual techniques;Patient/family education;Passive range of motion;Dry needling    PT Next Visit Plan Right knee pain-free ther ex and extension stretching, STW/M to right hamstrings and quads.  Modalites (no [place] to knees) PRN.  Right patellar mobility.    Consulted and Agree with Plan of Care Patient           Patient will benefit from skilled therapeutic intervention in order to improve the following deficits and impairments:  Pain,Decreased activity tolerance,Decreased strength,Decreased range of motion  Visit Diagnosis: Chronic pain of right knee  Stiffness of right knee, not elsewhere classified     Problem List Patient Active Problem List   Diagnosis Date Noted  . Paresthesia 06/14/2019  . Diabetic autonomic neuropathy associated with  type 2 diabetes mellitus ([place]) 06/14/2019  . Atypical chest pain 05/03/2019  . TIA (transient ischemic attack) 05/03/2019  .  Unsteady gait 11/03/2018  . Cerebrovascular accident (CVA) ([place]) 11/03/2018  . Cerebellar ataxia in diseases classified elsewhere ([place]) 11/03/2018  . 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 Aortic atherosclerosis ([place]) 12/08/2017  . Hardening of the aorta (main artery of the heart) ([place]) 12/08/2017  . 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[place]) 01/14/2015  . Depression 02/28/2013  . GERD (gastroesophageal reflux disease) 02/28/2013  . Hiatal hernia with gastroesophageal reflux 02/28/2013  . Peripheral neuropathy 02/28/2013  . DDD (degenerative disc disease), lumbosacral 06/23/2012  . Fibromyalgia syndrome 06/23/2012  . Essential hypertension, benign 06/23/2012  . Osteopenia 06/23/2012  . Diabetes ([place]) 06/23/2012  . Abnormal EKG 10/29/2010  . Obesity due to excess calories 10/29/2010  . 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 DYSPHAGIA 10/11/2009  . PERSONAL HISTORY OF COLONIC POLYPS 10/11/2009    Manroop Jakubowicz, [place] MPT 05/28/2020, 11:14 AM  [facility] [place], [place], [address] Phone: [phone]   Fax:  [phone]  Name: KIMORI TARTAGLIA MRN: [id number] Date of Birth: 03-Jun-1945

## 2020-05-30 ENCOUNTER — Ambulatory Visit: Payer: Medicare Other | Admitting: *Deleted

## 2020-05-30 ENCOUNTER — Other Ambulatory Visit: Payer: Self-pay

## 2020-05-30 DIAGNOSIS — M25561 Pain in right knee: Secondary | ICD-10-CM

## 2020-05-30 DIAGNOSIS — G8929 Other chronic pain: Secondary | ICD-10-CM

## 2020-05-30 DIAGNOSIS — M25661 Stiffness of right knee, not elsewhere classified: Secondary | ICD-10-CM

## 2020-05-30 NOTE — Therapy (Signed)
[facility] [place], [place], [address] Phone: [phone]   Fax:  [phone]  Physical Therapy Treatment  Patient Details  Name: [name] MRN: [id number] Date of Birth: 1945-09-17 Referring Provider (PT): Gaynelle Arabian MD   Encounter Date: 05/30/2020   PT End of Session - 05/30/20 0950    Visit Number 8    Number of Visits 8    Date for PT Re-Evaluation 06/04/20    PT Start Time 0948    PT Stop Time 1040    PT Time Calculation (min) 52 min           Past Medical History:  Diagnosis Date  . Allergy    seasonal  . Arthritis    [place]   . Colon polyps   . Complication of anesthesia    per pt, she woke up in the middle of last colon in 2014.  . Depression   . Diabetes mellitus, type 2 ([place]) 06/2010  . Diabetic neuropathy ([place])   . Diverticulosis    pt unaware  . Esophageal stricture   . Fibromyalgia    [place]   . GERD (gastroesophageal reflux disease)   . Hemorrhoids   . Hiatal hernia   . Hyperlipidemia   . Hypertension   . Menopause   . Numbness   . Peripheral vascular insufficiency ([place])   . Retinopathy    Bilateral  . TIA (transient ischemic attack)   . Tremor   . Vitamin D deficiency     Past Surgical History:  Procedure Laterality Date  . ABDOMINAL HYSTERECTOMY     partial and then total  . ANKLE SURGERY Right   . COLONOSCOPY    . ELBOW SURGERY     left  . KNEE ARTHROSCOPY Right 09/12/2018   Procedure: ARTHROSCOPY KNEE; SYNOVECTOMY;  Surgeon: Gaynelle Arabian, MD;  Location: [facility];  Service: Orthopedics;  Laterality: Right;  64mn  . REFRACTIVE SURGERY  2003  . TOTAL KNEE ARTHROPLASTY     bilateral  . TOTAL KNEE REVISION Right 03/09/2018   Procedure: RIGHT TOTAL KNEE REVISION;  Surgeon: AGaynelle Arabian MD;  Location: [facility];  Service: Orthopedics;  Laterality: Right;  1239m with abductor block  . VESICOVAGINAL FISTULA CLOSURE W/ TAH  1981    There were no vitals filed for  this visit.   Subjective Assessment - 05/30/20 0949    Subjective COVID-19 screen performed prior to patient entering clinic.  Some better. 5/10 today    Pertinent History H/o LBP, bilateral TKA's and revision on right, fibromyalgia, HTN, right ankle surgery.    How long can you stand comfortably? Short time.    How long can you walk comfortably? Short community distance.    Patient Stated Goals Reduce pain and be bale to do more.    Currently in Pain? Yes    Pain Score 5     Pain Location Knee    Pain Orientation Right                             [facility] Adult PT Treatment/Exercise - 05/30/20 0001      Exercises   Exercises Knee/Hip      Knee/Hip Exercises: Aerobic   Nustep Level 3 x 10 minutes.      Knee/Hip Exercises: Machines for Strengthening   Cybex Knee Extension 10# 3 x fatigue    Cybex Knee Flexion 30# 3x fatigue  Knee/Hip Exercises: Standing   Forward Lunges Right;1 set;15 reps   focused on quad control   Step Down 3 sets;10 reps;Both;Step Height: 4";Hand Hold: 2   pain free     Electrical Stimulation   Electrical Stimulation Location RT knee    Electrical Stimulation Action IFC    Electrical Stimulation Parameters 80-150hz  x 15 mins    Electrical Stimulation Goals Pain      Vasopneumatic   Number Minutes Vasopneumatic  15 minutes    Vasopnuematic Location  --   Left knee.   Vasopneumatic Pressure Low    Vasopneumatic Temperature  36 for edema                       PT Long Term Goals - 05/30/20 1013      PT LONG TERM GOAL #1   Title Ind with an HEP.    Time 4    Period Weeks    Status On-going      PT LONG TERM GOAL #2   Title Improve right knee extension by 10 degrees.    Time 4    Period Weeks    Status On-going      PT LONG TERM GOAL #3   Title Walk a community distance with pain not > 3-4/10 without an assistive device.    Period Weeks    Status Achieved      PT LONG TERM GOAL #4   Title Stand 20 minutes  with right knee pain not > 4/10.    Time 4    Period Weeks    Status Achieved                 Plan - 05/30/20 1001    Clinical Impression Statement Pt arrived today doing fairly well and reports doing 30% better since starting PT. Rx focused on RT LE strengthening and extension ROM. She has met some LTGs and would like to cont. with PT for progression. Recert to cont.    Personal Factors and Comorbidities Comorbidity 1;Comorbidity 2;Other    Comorbidities H/o LBP, bilateral TKA's and revision on right, fibromyalgia, HTN, right ankle surgery.    Examination-Activity Limitations Locomotion Level;Stand;Other    Examination-Participation Restrictions Other    Stability/Clinical Decision Making Evolving/Moderate complexity    Rehab Potential Good    PT Frequency 2x / week    PT Treatment/Interventions ADLs/Self Care Home Management;Cryotherapy;Electrical Stimulation;Ultrasound;Moist Heat;Iontophoresis 61m/ml Dexamethasone;Gait training;Stair training;Functional mobility training;Therapeutic activities;Therapeutic exercise;Manual techniques;Patient/family education;Passive range of motion;Dry needling    PT Next Visit Plan Right knee pain-free ther ex and extension stretching, STW/M to right hamstrings and quads.  Modalites (no [place] knees) PRN.  Right patellar mobility.  Recert to cont. to meet LTGs    Consulted and Agree with Plan of Care Patient           Patient will benefit from skilled therapeutic intervention in order to improve the following deficits and impairments:  Pain,Decreased activity tolerance,Decreased strength,Decreased range of motion  Visit Diagnosis: Chronic pain of right knee  Stiffness of right knee, not elsewhere classified     Problem List Patient Active Problem List   Diagnosis Date Noted  . Paresthesia 06/14/2019  . Diabetic autonomic neuropathy associated with type 2 diabetes mellitus ([place] 06/14/2019  . Atypical chest pain 05/03/2019  . TIA  (transient ischemic attack) 05/03/2019  . Unsteady gait 11/03/2018  . Cerebrovascular accident (CVA) ([place] 11/03/2018  . Cerebellar ataxia in diseases classified elsewhere ([place] 11/03/2018  .  Failed total knee arthroplasty ([place]) 03/09/2018  . Aortic atherosclerosis ([place]) 12/08/2017  . Hardening of the aorta (main artery of the heart) ([place]) 12/08/2017  . 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[place]) 01/14/2015  . Depression 02/28/2013  . GERD (gastroesophageal reflux disease) 02/28/2013  . Hiatal hernia with gastroesophageal reflux 02/28/2013  . Peripheral neuropathy 02/28/2013  . DDD (degenerative disc disease), lumbosacral 06/23/2012  . Fibromyalgia syndrome 06/23/2012  . Essential hypertension, benign 06/23/2012  . Osteopenia 06/23/2012  . Diabetes ([place]) 06/23/2012  . Abnormal EKG 10/29/2010  . Obesity due to excess calories 10/29/2010  . 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 DYSPHAGIA 10/11/2009  . PERSONAL HISTORY OF COLONIC POLYPS 10/11/2009    Kyjuan Gause,CHRIS, PTA 05/30/2020, 10:56 AM  [facility] [place], [place], [address] Phone: [phone]   Fax:  [phone]  Name: [name] MRN: [id number] Date of Birth: Nov 11, 1945

## 2020-05-31 ENCOUNTER — Ambulatory Visit (INDEPENDENT_AMBULATORY_CARE_PROVIDER_SITE_OTHER): Payer: Medicare Other | Admitting: *Deleted

## 2020-05-31 VITALS — BP 120/70 | Ht 66.0 in | Wt 206.0 lb

## 2020-05-31 DIAGNOSIS — Z0000 Encounter for general adult medical examination without abnormal findings: Secondary | ICD-10-CM

## 2020-05-31 NOTE — Patient Instructions (Signed)
  Ms. Kushner , Thank you for taking time to come for your Medicare Wellness Visit. I appreciate your ongoing commitment to your health goals. Please review the following plan we discussed and let me know if I can assist you in the future.   These are the goals we discussed: Goals    . DIET - INCREASE WATER INTAKE     Try to drink 6-8 glasses of water daily.    . Prevent falls     Stay active        This is a list of the screening recommended for you and due dates:  Health Maintenance  Topic Date Due  . COVID-19 Vaccine (3 - Booster for Pfizer series) 06/17/2020*  . Stool Blood Test  11/14/2020*  . Eye exam for diabetics  06/25/2020  . Complete foot exam   08/02/2020  . Hemoglobin A1C  09/23/2020  . Mammogram  09/11/2021  . DEXA scan (bone density measurement)  01/24/2022  . Colon Cancer Screening  11/16/2023  . Tetanus Vaccine  09/04/2026  . Flu Shot  Completed  .  Hepatitis C: One time screening is recommended by Center for Disease Control  (CDC) for  adults born from 18 through 1965.   Completed  . Pneumonia vaccines  Completed  *Topic was postponed. The date shown is not the original due date.

## 2020-05-31 NOTE — Progress Notes (Signed)
MEDICARE ANNUAL WELLNESS VISIT  05/31/2020  Telephone Visit Disclaimer This Medicare AWV was conducted by telephone due to national recommendations for restrictions regarding the COVID-19 Pandemic (e.g. social distancing).  I verified, using two identifiers, that I am speaking with [name] or their authorized healthcare agent. I discussed the limitations, risks, security, and privacy concerns of performing an evaluation and management service by telephone and the potential availability of an in-person appointment in the future. The patient expressed understanding and agreed to proceed.  Location of Patient: in her home Location of Provider (nurse):  In office  Subjective:    [name] is a 75 y.o. female patient of Dettinger, Fransisca Kaufmann, MD who had a Medicare Annual Wellness Visit today via telephone. [name] is Retired and lives with their spouse. she has 2 children. she reports that she is socially active and does interact with friends/family regularly. she is not physically active and enjoys shopping.  Patient Care Team: Dettinger, Fransisca Kaufmann, MD as PCP - General (Family Medicine) Buford Dresser, MD as PCP - Cardiology (Cardiology) Irene Shipper, MD as Consulting Physician (Gastroenterology) Sheralyn Boatman, MD as Consulting Physician (Psychiatry) Suella Broad, MD as Consulting Physician (Physical Medicine and Rehabilitation) Marica Otter, [place] as Consulting Physician (Optometry) Lavera Guise, [facility] (Pharmacist)  Advanced Directives 05/31/2020 05/07/2020 05/03/2019 05/03/2019 12/14/2018 09/08/2018 03/09/2018  Does Patient Have a Medical Advance Directive? Yes Yes No No No Yes No  Type of Advance Directive Living will - - - - Living will;Healthcare Power of Attorney -  Does patient want to make changes to medical advance directive? No - Patient declined - - - - No - Patient declined -  Copy of [place] in [place]  Would patient like  information on creating a medical advance directive? No - Patient declined - No - Patient declined - No - Patient declined - No - Patient declined    Hospital Utilization Over the Past 12 Months: # of hospitalizations or ER visits: 0 # of surgeries: 0  Review of Systems    Patient reports that her overall health is unchanged compared to last year.  General ROS: negative  Patient Reported Readings (BP, Pulse, CBG, Weight, etc) BP 120/70   Ht 5' 6"  (1.676 m)   Wt 206 lb (93.4 kg)   BMI 33.25 kg/m    Pain Assessment       Current Medications & Allergies (verified) Allergies as of 05/31/2020      Reactions   Penicillins Other (See Comments)   Blisters in mouth Did it involve swelling of the face/tongue/throat, SOB, or low BP? No Did it involve sudden or severe rash/hives, skin peeling, or any reaction on the inside of your mouth or nose? No Did you need to seek medical attention at a hospital or doctor's office? No When did it last happen?Childhood allergy If all above answers are "NO", may proceed with cephalosporin use.   Codeine Nausea And Vomiting   Erythromycin Nausea And Vomiting   Fenofibrate Other (See Comments)   aching & edema    Lyrica [pregabalin] Other (See Comments)   Edema in feet   Statins Other (See Comments)   Joints ache   Sulfa Antibiotics Nausea And Vomiting      Medication List       Accurate as of May 31, 2020 11:31 AM. If you have any questions, ask your nurse or doctor.  STOP taking these medications   LUTEIN 20 PO     TAKE these medications   ALPRAZolam 0.5 MG tablet Commonly known as: XANAX Take 1 tablet (0.5 mg total) by mouth as needed for anxiety. Take 1-2 tabs daily as needed   ARIPiprazole 2 MG tablet Commonly known as: ABILIFY Take 1 tablet (2 mg total) by mouth daily. What changed: how much to take   atorvastatin 10 MG tablet Commonly known as: LIPITOR Take 1 tablet (10 mg total) by mouth daily.    blood glucose meter kit and supplies Kit Dispense based on patient and insurance preference. Use up to two times daily as directed. (Dx: type 2 DM - E11.9)   clopidogrel 75 MG tablet Commonly known as: PLAVIX TAKE 1 TABLET DAILY   escitalopram 20 MG tablet Commonly known as: LEXAPRO Take 1 tablet (20 mg total) by mouth daily.   ezetimibe 10 MG tablet Commonly known as: ZETIA TAKE 1 TABLET DAILY   Fish Oil 1000 MG Caps Take by mouth. Take 2 pills daily   gabapentin 600 MG tablet Commonly known as: NEURONTIN Take 1 tablet (600 mg total) by mouth 4 (four) times daily.   glipiZIDE 5 MG 24 hr tablet Commonly known as: GLUCOTROL XL Take 1 tablet (5 mg total) by mouth daily with breakfast.   HYDROcodone-acetaminophen 5-325 MG tablet Commonly known as: NORCO/VICODIN Take 1 tablet by mouth every 6 (six) hours as needed for moderate pain.   lisinopril-hydrochlorothiazide 20-25 MG tablet Commonly known as: ZESTORETIC Take 1 tablet by mouth daily.   nicotine polacrilex 2 MG gum Commonly known as: NICORETTE Take 2 mg by mouth daily.   ONE TOUCH ULTRA 2 w/Device Kit CHECK BLOOD SUGAR UP TO TWICE DAILY Dx Q11.9   OneTouch Delica Lancets 41D Misc CHECK BLOOD SUGAR UP TO TWICE DAILY Dx E11.9   OneTouch Ultra test strip Generic drug: glucose blood Check BS up to 2 times daily Dx 11.43   Ozempic (1 MG/DOSE) 4 MG/3ML Sopn Generic drug: Semaglutide (1 MG/DOSE) INJECT 1 MG ONCE A WEEK AS DIRECTED   pantoprazole 40 MG tablet Commonly known as: PROTONIX Take 1 tablet (40 mg total) by mouth daily.   Premarin vaginal cream Generic drug: conjugated estrogens Place 1 Applicatorful vaginally at bedtime as needed.   Synjardy XR 25-1000 MG Tb24 Generic drug: Empagliflozin-metFORMIN HCl ER Take 1 tablet by mouth daily.   Vitamin D 50 MCG (2000 UT) tablet Take 2,000 Units by mouth daily.       History (reviewed): Past Medical History:  Diagnosis Date  . Allergy    seasonal   . Arthritis    [place]   . Colon polyps   . Complication of anesthesia    per pt, she woke up in the middle of last colon in 2014.  . Depression   . Diabetes mellitus, type 2 ([place]) 06/2010  . Diabetic neuropathy ([place])   . Diverticulosis    pt unaware  . Esophageal stricture   . Fibromyalgia    [place]   . GERD (gastroesophageal reflux disease)   . Hemorrhoids   . Hiatal hernia   . Hyperlipidemia   . Hypertension   . Menopause   . Numbness   . Peripheral vascular insufficiency ([place])   . Retinopathy    Bilateral  . TIA (transient ischemic attack)   . Tremor   . Vitamin D deficiency    Past Surgical History:  Procedure Laterality Date  . ABDOMINAL HYSTERECTOMY  partial and then total  . ANKLE SURGERY Right   . COLONOSCOPY    . ELBOW SURGERY     left  . KNEE ARTHROSCOPY Right 09/12/2018   Procedure: ARTHROSCOPY KNEE; SYNOVECTOMY;  Surgeon: Gaynelle Arabian, MD;  Location: [facility];  Service: Orthopedics;  Laterality: Right;  68mn  . REFRACTIVE SURGERY  2003  . TOTAL KNEE ARTHROPLASTY     bilateral  . TOTAL KNEE REVISION Right 03/09/2018   Procedure: RIGHT TOTAL KNEE REVISION;  Surgeon: AGaynelle Arabian MD;  Location: [facility];  Service: Orthopedics;  Laterality: Right;  125m with abductor block  . VESICOVAGINAL FISTULA CLOSURE W/ TAH  1981   Family History  Problem Relation Age of Onset  . Diabetes Mother   . Heart failure Mother   . Hypertension Mother   . Cirrhosis Father   . Cirrhosis Brother    Social History   Socioeconomic History  . Marital status: Married    Spouse name: DaKasandra Knudsen. Number of children: 2  . Years of education: two years of college  . Highest education level: Some college, no degree  Occupational History  . Occupation: Retired  Tobacco Use  . Smoking status: Former Smoker    Packs/day: 1.00    Years: 10.00    Pack years: 10.00    Types: Cigarettes    Start date: 11/07/1975    Quit date: 06/24/2010    Years since quitting: 9.9  .  Smokeless tobacco: Never Used  Vaping Use  . Vaping Use: Never used  Substance and Sexual Activity  . Alcohol use: Not Currently  . Drug use: No  . Sexual activity: Yes    Birth control/protection: Surgical  Other Topics Concern  . Not on file  Social History Narrative   Lives at home with her husband.   Left-handed.   One can of Diet Coke per day.   Social Determinants of Health   Financial Resource Strain: Not on file  Food Insecurity: Not on file  Transportation Needs: Not on file  Physical Activity: Not on file  Stress: Not on file  Social Connections: Not on file    Activities of Daily Living In your present state of health, do you have any difficulty performing the following activities: 05/31/2020  Hearing? N  Vision? Y  Comment clouds over eyes - seen [facility]  Difficulty concentrating or making decisions? N  Walking or climbing stairs? N  Dressing or bathing? N  Doing errands, shopping? N  Preparing Food and eating ? N  Using the Toilet? N  In the past six months, have you accidently leaked urine? N  Do you have problems with loss of bowel control? N  Managing your Medications? N  Managing your Finances? N  Housekeeping or managing your Housekeeping? N  Some recent data might be hidden    Patient Education/ Literacy    Exercise Current Exercise Habits: The patient does not participate in regular exercise at present (therapy for knee replacement now), Exercise limited by: orthopedic condition(s)  Diet Patient reports consuming 2 meals a day and 1 snack(s) a day Patient reports that her primary diet is: Regular Patient reports that she does have regular access to food.   Depression Screen PHQ 2/9 Scores 05/31/2020 05/17/2020 03/25/2020 12/25/2019 11/09/2019 09/21/2019 08/03/2019  PHQ - 2 Score 1 1 1 1 1 1 1   PHQ- 9 Score - - - - - - -     Fall Risk Fall Risk  05/31/2020 05/17/2020 03/25/2020 12/25/2019  11/09/2019  Falls in the past year? 0 0 0 1 1  Number falls  in past yr: - - - 0 -  Injury with Fall? - - - 0 -  Risk for fall due to : - - - Impaired balance/gait -  Follow up - - - - -  Comment - - - - -     Objective:  [name] seemed alert and oriented and she participated appropriately during our telephone visit.  Blood Pressure Weight BMI  BP Readings from Last 3 Encounters:  05/31/20 120/70  05/17/20 115/61  03/25/20 127/71   Wt Readings from Last 3 Encounters:  05/31/20 206 lb (93.4 kg)  05/17/20 206 lb (93.4 kg)  03/25/20 208 lb (94.3 kg)   BMI Readings from Last 1 Encounters:  05/31/20 33.25 kg/m    *Unable to obtain current vital signs, weight, and BMI due to telephone visit type  Hearing/Vision  . [name] did not seem to have difficulty with hearing/understanding during the telephone conversation . Reports that she has not had a formal eye exam by an eye care professional within the past year . Reports that she has not had a formal hearing evaluation within the past year *Unable to fully assess hearing and vision during telephone visit type  Cognitive Function: 6CIT Screen 05/31/2020 12/14/2018  What Year? 0 points 0 points  What month? 0 points 0 points  What time? 0 points 0 points  Count back from 20 0 points 0 points  Months in reverse 0 points 2 points  Repeat phrase 0 points 0 points  Total Score 0 2   (Normal:0-7, Significant for Dysfunction: >8)  Normal Cognitive Function Screening: Yes   Immunization & Health Maintenance Record Immunization History  Administered Date(s) Administered  . Fluad Quad(high Dose 65+) 01/24/2020  . H1N1 03/26/2008  . Influenza, High Dose Seasonal PF 01/29/2016, 12/31/2017  . Influenza,inj,Quad PF,6+ Mos 01/30/2013, 01/14/2015, 02/06/2019  . Influenza,inj,quad, With Preservative 01/04/2014, 01/27/2017  . Influenza-Unspecified 01/27/2010, 02/05/2011, 01/19/2012, 02/04/2017, 02/06/2019  . PFIZER(Purple Top)SARS-COV-2 Vaccination 04/26/2019, 05/17/2019  . Pneumococcal  Conjugate-13 02/28/2013  . Pneumococcal Polysaccharide-23 02/05/2011  . Tdap 09/03/2016  . Zoster 08/08/2009  . Zoster Recombinat (Shingrix) 11/26/2018, 01/24/2019, 02/06/2019    Health Maintenance  Topic Date Due  . COVID-19 Vaccine (3 - Booster for Pfizer series) 06/17/2020 (Originally 11/14/2019)  . COLON CANCER SCREENING ANNUAL FOBT  11/14/2020 (Originally 04/16/2020)  . OPHTHALMOLOGY EXAM  06/25/2020  . FOOT EXAM  08/02/2020  . HEMOGLOBIN A1C  09/23/2020  . MAMMOGRAM  09/11/2021  . DEXA SCAN  01/24/2022  . COLONOSCOPY (Pts 45-27yr Insurance coverage will need to be confirmed)  11/16/2023  . TETANUS/TDAP  09/04/2026  . INFLUENZA VACCINE  Completed  . Hepatitis C Screening  Completed  . PNA vac Low Risk Adult  Completed       Assessment  This is a routine wellness examination for CMarlowe Haas  Health Maintenance: Due or Overdue There are no preventive care reminders to display for this patient.  [place] not need a referral for Community Assistance: Care Management:   no Social Work:    no Prescription Assistance:  no Nutrition/Diabetes Education:  no   Plan:  Personalized Goals Goals Addressed            This Visit's Progress   . DIET - INCREASE WATER INTAKE   Not on track    Try to drink 6-8 glasses of water daily.    .Marland Kitchen  Prevent falls       Stay active       Personalized Health Maintenance & Screening Recommendations  has had covid booster - will call back with the date  Lung Cancer Screening Recommended: no (Low Dose CT Chest recommended if Age 72-80 years, 30 pack-year currently smoking OR have quit w/in past 15 years) Hepatitis C Screening recommended: no HIV Screening recommended: no  Advanced Directives: Written information was not prepared per patient's request.  Referrals & Orders No orders of the defined types were placed in this encounter.   Follow-up Plan . Follow-up with Dettinger, Fransisca Kaufmann, MD as planned    I have  personally reviewed and noted the following in the patient's chart:   . Medical and social history . Use of alcohol, tobacco or illicit drugs  . Current medications and supplements . Functional ability and status . Nutritional status . Physical activity . Advanced directives . List of other physicians . Hospitalizations, surgeries, and ER visits in previous 12 months . Vitals . Screenings to include cognitive, depression, and falls . Referrals and appointments  In addition, I have reviewed and discussed with [name] certain preventive protocols, quality metrics, and best practice recommendations. A written personalized care plan for preventive services as well as general preventive health recommendations is available and can be mailed to the patient at her request.      [place] Dec  05/31/2020

## 2020-06-02 IMAGING — CT CT CHEST W/O CM
2 of 3 series · 15 of 36 positions shown, 18 images · non-contrast
Comparison: CT chest 07/25/2013, 05/29/2011. The outside chest
x-ray is not available for direct comparison.

CLINICAL DATA: Possible 1 cm nodule at the LEFT lung base on
outside chest x-ray. Former smoker who quit in 9439. Current history
of hypertension and diabetes.

EXAM:
CT CHEST WITHOUT CONTRAST
TECHNIQUE: Multidetector CT imaging of the chest was performed following the
standard protocol without IV contrast.

[Series 2: thorax · axial · 0.75mm/px · z∈[+1246,+1542]mm · 12 of 174 slices shown, 15 images]
[im 13/174  mediastinal]
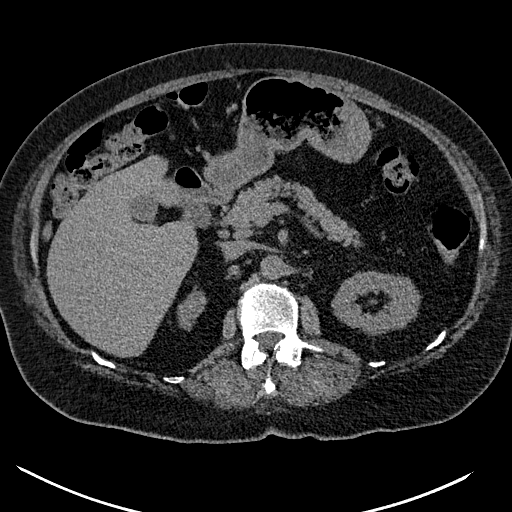
[im 13/174  lung]
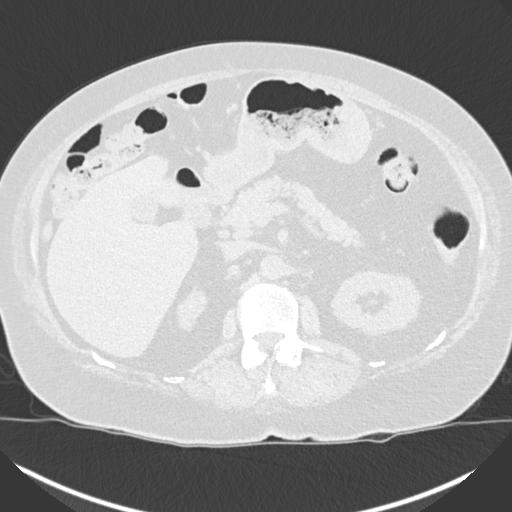
[im 26/174  lung]
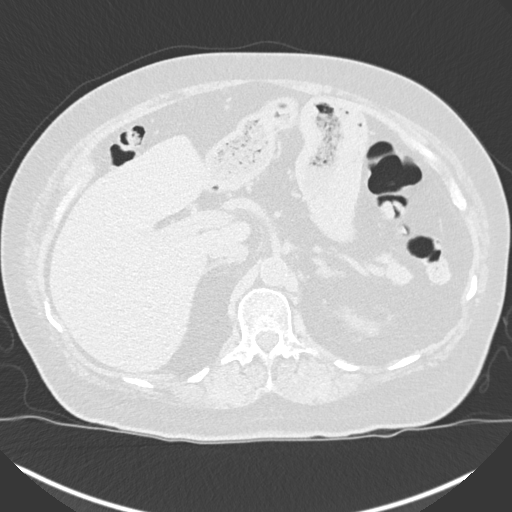
[im 39/174  lung]
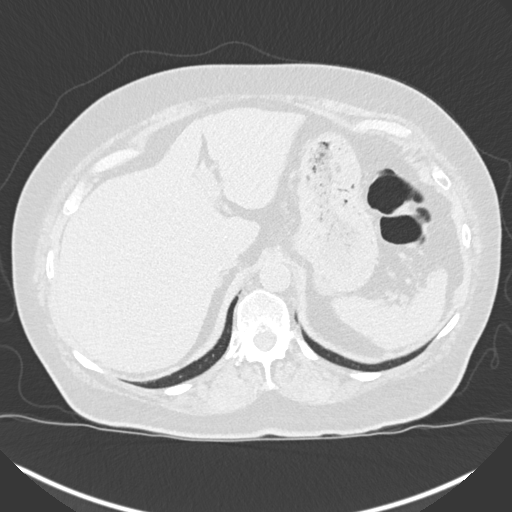
[im 52/174  lung]
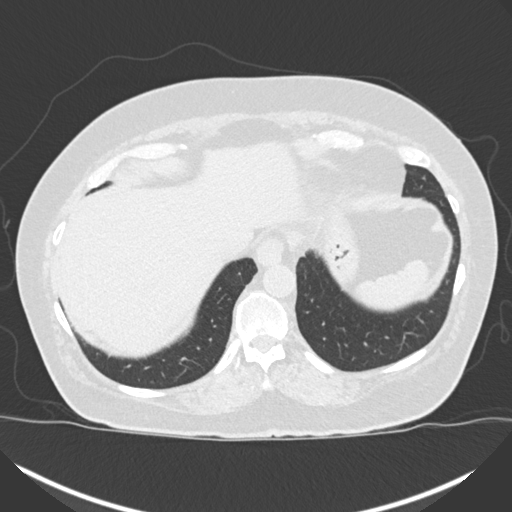
[im 65/174  mediastinal]
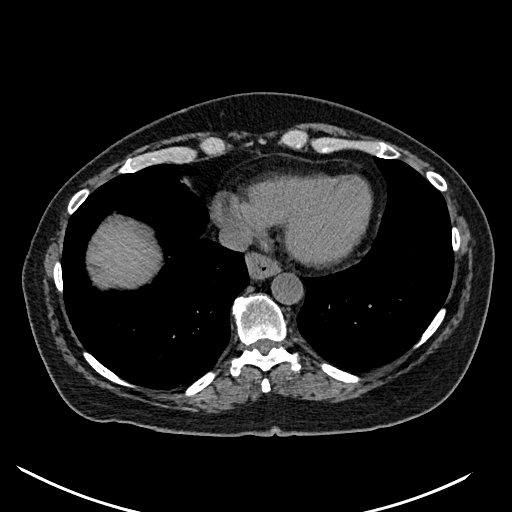
[im 65/174  lung]
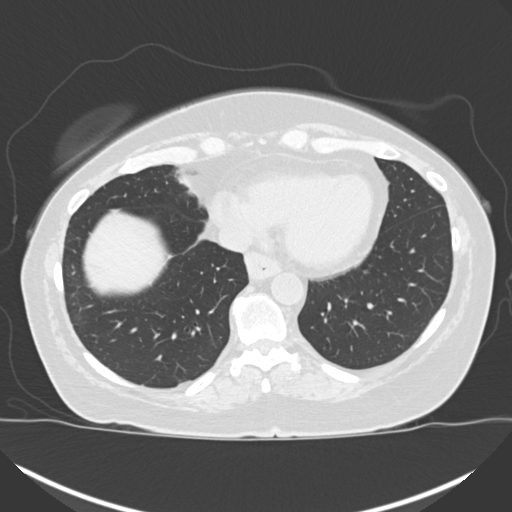
[im 77/174  lung]
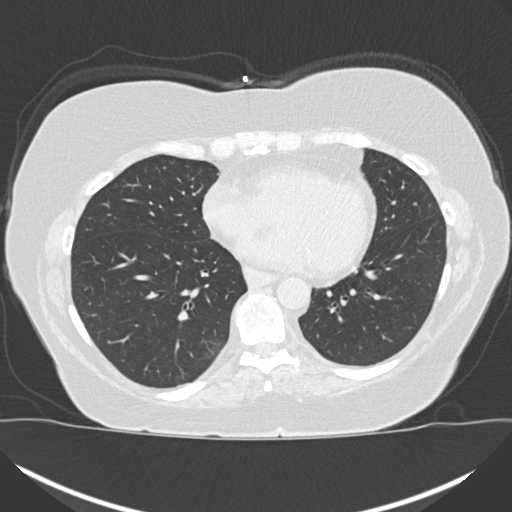
[im 97/174  lung]
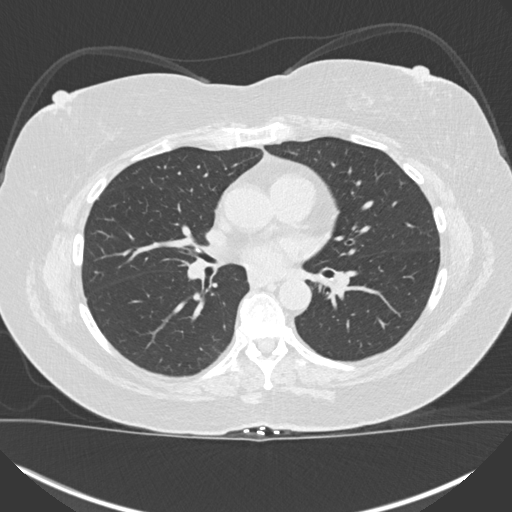
[im 109/174  lung]
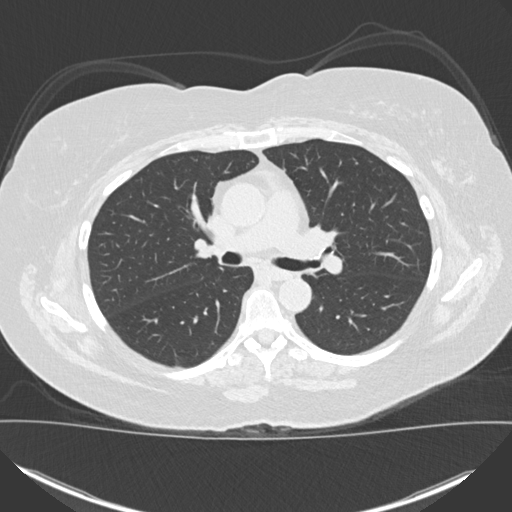
[im 122/174  mediastinal]
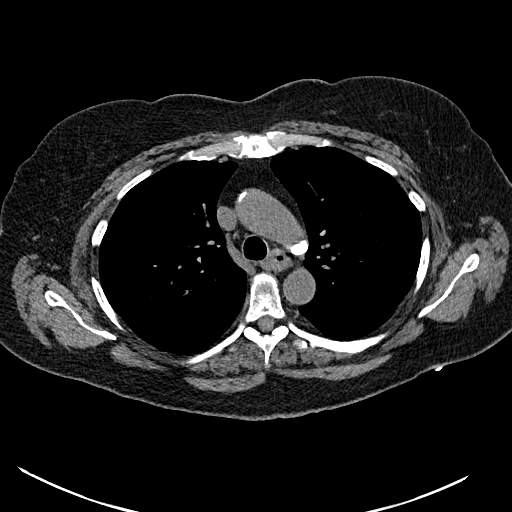
[im 122/174  lung]
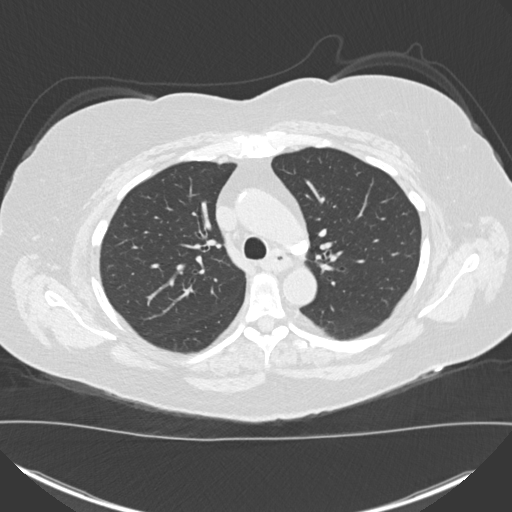
[im 135/174  lung]
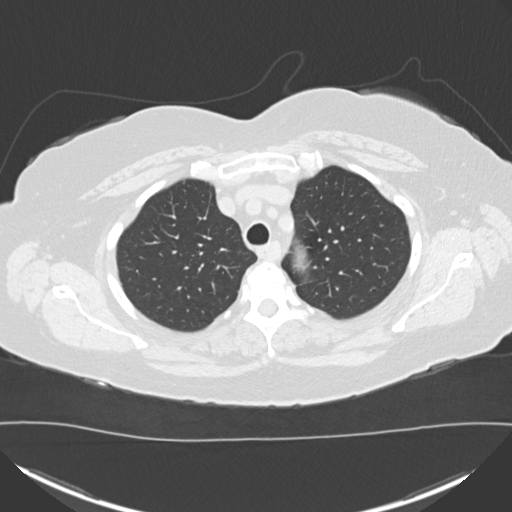
[im 148/174  lung]
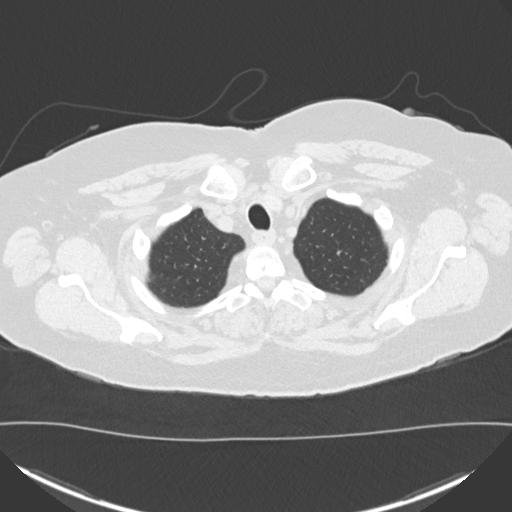
[im 161/174  lung]
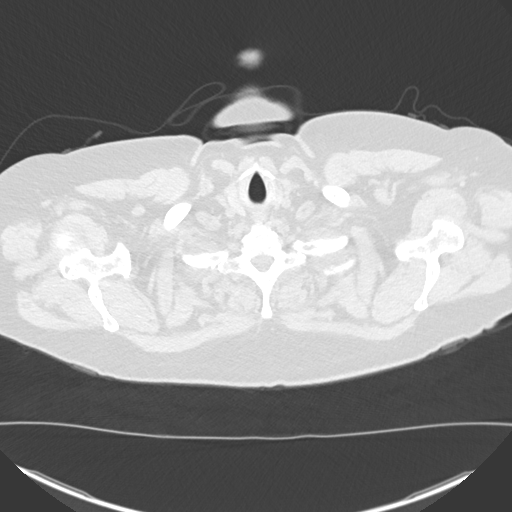

[Series 5: coronal · coronal · 0.68mm/px · 3 of 151 slices shown]
[im 31/151  lung]
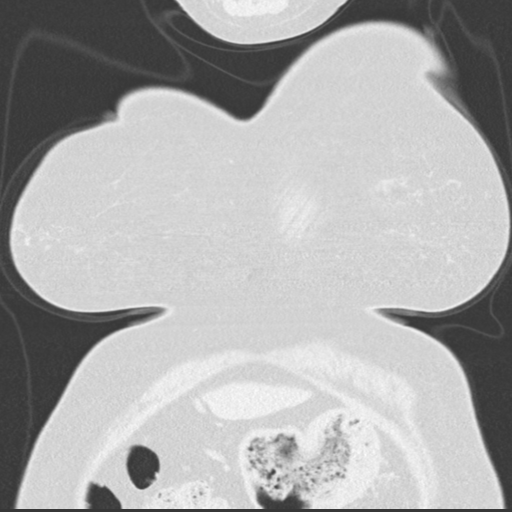
[im 61/151  lung]
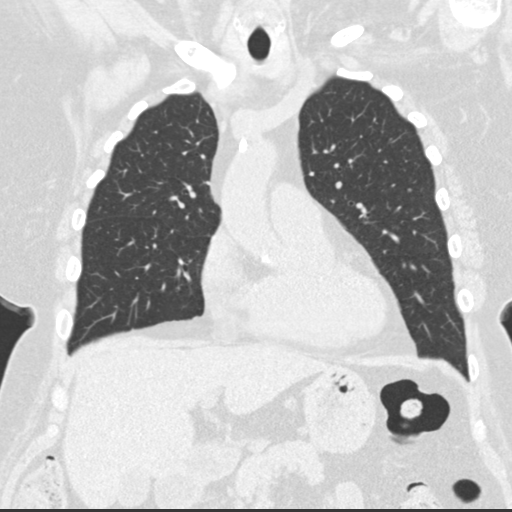
[im 91/151  lung]
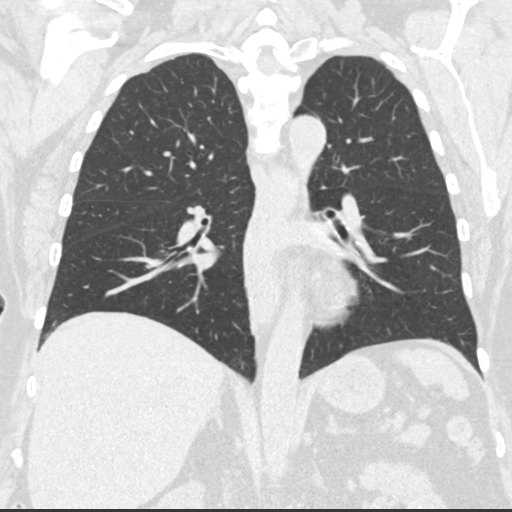

[15 of 36 positions shown; findings below may reference images not displayed]

FINDINGS: Cardiovascular: Stable normal heart size. Prominent epicardial fat.
Mitral annular calcification. Mild LAD coronary atherosclerosis. No
significant pericardial effusion. Mild atherosclerosis involving the
thoracic and proximal abdominal aorta without evidence of aneurysm.

Mediastinum/Nodes: No pathologically enlarged mediastinal, hilar or
axillary lymph nodes. No mediastinal masses. Thickening of the wall
of the distal esophagus without evidence of hiatal hernia. Multiple
small nodules involving the normal sized thyroid gland, the largest
approximately 1.3 cm arising from the lower pole of the LEFT lobe,
unchanged since the 0251 CT.

Lungs/Pleura: No calcified or noncalcified nodules involving either
lung as questioned on the outside chest x-ray. Mild scarring
involving the RIGHT MIDDLE LOBE and minimal scarring involving the
LEFT LOWER LOBE. Lung parenchyma otherwise clear. No evidence of
interstitial lung disease. No pleural effusions. Central airways
patent without significant bronchial wall thickening. Calcified
tracheobronchial cartilages.

Upper Abdomen: Unremarkable for the unenhanced technique.

Musculoskeletal: Benign bone island in the ANTERIOR RIGHT sixth rib
which may account for the finding on the outside chest x-ray and
which is stable over the prior CTs. Degenerative disc disease and
spondylosis involving the visualized LOWER cervical spine. Mild
spondylosis throughout the thoracic spine. No acute findings.
IMPRESSION: 1. No evidence of calcified or noncalcified pulmonary nodules as
questioned on the outside chest x-ray.
2. No acute cardiopulmonary disease.
3. Benign bone island in the ANTERIOR RIGHT sixth rib which may
account for the finding on the outside chest x-ray.
4. Thickening of the wall of the distal esophagus without evidence
of hiatal hernia. Query GE reflux disease.

Aortic Atherosclerosis ([id number]-AC0.0).

## 2020-06-04 ENCOUNTER — Ambulatory Visit: Payer: Medicare Other | Attending: Orthopedic Surgery | Admitting: *Deleted

## 2020-06-04 ENCOUNTER — Other Ambulatory Visit: Payer: Self-pay

## 2020-06-04 DIAGNOSIS — M25561 Pain in right knee: Secondary | ICD-10-CM | POA: Insufficient documentation

## 2020-06-04 DIAGNOSIS — M25661 Stiffness of right knee, not elsewhere classified: Secondary | ICD-10-CM | POA: Diagnosis not present

## 2020-06-04 DIAGNOSIS — G8929 Other chronic pain: Secondary | ICD-10-CM | POA: Diagnosis not present

## 2020-06-04 NOTE — Therapy (Signed)
[facility] [place], [place], [address] Phone: [phone]   Fax:  [phone]  Physical Therapy Treatment  Patient Details  Name: [name] MRN: [id number] Date of Birth: November 28, 1945 Referring Provider (PT): Gaynelle Arabian MD   Encounter Date: 06/04/2020   PT End of Session - 06/04/20 0951    Visit Number 9    Number of Visits 14    Date for PT Re-Evaluation 06/18/20    PT Start Time 0945    PT Stop Time 4403    PT Time Calculation (min) 50 min           Past Medical History:  Diagnosis Date  . Allergy    seasonal  . Arthritis    [place]   . Colon polyps   . Complication of anesthesia    per pt, she woke up in the middle of last colon in 2014.  . Depression   . Diabetes mellitus, type 2 ([place]) 06/2010  . Diabetic neuropathy ([place])   . Diverticulosis    pt unaware  . Esophageal stricture   . Fibromyalgia    [place]   . GERD (gastroesophageal reflux disease)   . Hemorrhoids   . Hiatal hernia   . Hyperlipidemia   . Hypertension   . Menopause   . Numbness   . Peripheral vascular insufficiency ([place])   . Retinopathy    Bilateral  . TIA (transient ischemic attack)   . Tremor   . Vitamin D deficiency     Past Surgical History:  Procedure Laterality Date  . ABDOMINAL HYSTERECTOMY     partial and then total  . ANKLE SURGERY Right   . COLONOSCOPY    . ELBOW SURGERY     left  . KNEE ARTHROSCOPY Right 09/12/2018   Procedure: ARTHROSCOPY KNEE; SYNOVECTOMY;  Surgeon: Gaynelle Arabian, MD;  Location: [facility];  Service: Orthopedics;  Laterality: Right;  59min  . REFRACTIVE SURGERY  2003  . TOTAL KNEE ARTHROPLASTY     bilateral  . TOTAL KNEE REVISION Right 03/09/2018   Procedure: RIGHT TOTAL KNEE REVISION;  Surgeon: Gaynelle Arabian, MD;  Location: [facility];  Service: Orthopedics;  Laterality: Right;  166min with abductor block  . VESICOVAGINAL FISTULA CLOSURE W/ TAH  1981    There were no vitals filed for  this visit.   Subjective Assessment - 06/04/20 0950    Subjective COVID-19 screen performed prior to patient entering clinic. Doing better. 3/10 today    Pertinent History H/o LBP, bilateral TKA's and revision on right, fibromyalgia, HTN, right ankle surgery.    How long can you stand comfortably? Short time.    How long can you walk comfortably? Short community distance.    Patient Stated Goals Reduce pain and be bale to do more.    Currently in Pain? Yes    Pain Score 3     Pain Location Knee    Pain Orientation Right    Pain Descriptors / Indicators Sore    Pain Onset More than a month ago                             [facility] Adult PT Treatment/Exercise - 06/04/20 0001      Exercises   Exercises Knee/Hip      Knee/Hip Exercises: Aerobic   Nustep Level 3-5 x 10 minutes.LEs only      Knee/Hip Exercises: Machines for Strengthening   Cybex Knee  Extension 10# 3 x fatigue    Cybex Knee Flexion 30# 3x fatigue      Knee/Hip Exercises: Standing   Heel Raises Both;1 set;20 reps    Heel Raises Limitations Toe raises x 20    Forward Lunges Right;1 set;15 reps   focused on quad control 14" step   Forward Step Up Right;2 sets;10 reps;Step Height: 6";Hand Hold: 2    Step Down 3 sets;10 reps;Both;Step Height: 4";Hand Hold: 2   pain free     Knee/Hip Exercises: Seated   Sit to Sand 2 sets;10 reps   low mat     Electrical Stimulation   Electrical Stimulation Location RT knee    Electrical Stimulation Action IFC    Electrical Stimulation Parameters 80-150HZ  x 15 mins    Electrical Stimulation Goals Pain      Vasopneumatic   Number Minutes Vasopneumatic  15 minutes    Vasopnuematic Location  Knee    Vasopneumatic Pressure Low    Vasopneumatic Temperature  36 for edema                       PT Long Term Goals - 05/30/20 1013      PT LONG TERM GOAL #1   Title Ind with an HEP.    Time 4    Period Weeks    Status On-going      PT LONG TERM GOAL #2    Title Improve right knee extension by 10 degrees.    Time 4    Period Weeks    Status On-going      PT LONG TERM GOAL #3   Title Walk a community distance with pain not > 3-4/10 without an assistive device.    Period Weeks    Status Achieved      PT LONG TERM GOAL #4   Title Stand 20 minutes with right knee pain not > 4/10.    Time 4    Period Weeks    Status Achieved                 Plan - 06/04/20 1024    Clinical Impression Statement Pt arrived today doing better with No dizziness today. She was able to complete all exs and act's today with no c/o increased pain and just mm fatigue. Normal response with Estim/ Vaso.    Personal Factors and Comorbidities Comorbidity 1;Comorbidity 2;Other    Comorbidities H/o LBP, bilateral TKA's and revision on right, fibromyalgia, HTN, right ankle surgery.    Examination-Activity Limitations Locomotion Level;Stand;Other    Stability/Clinical Decision Making Evolving/Moderate complexity    Rehab Potential Good    PT Frequency 2x / week    PT Duration 4 weeks    PT Treatment/Interventions ADLs/Self Care Home Management;Cryotherapy;Electrical Stimulation;Ultrasound;Moist Heat;Iontophoresis 4mg /ml Dexamethasone;Gait training;Stair training;Functional mobility training;Therapeutic activities;Therapeutic exercise;Manual techniques;Patient/family education;Passive range of motion;Dry needling    PT Next Visit Plan Right knee pain-free ther ex and extension stretching, STW/M to right hamstrings and quads.  Modalites (no [place] to knees) PRN.  Right patellar mobility.  Recert to cont. to meet LTGs    Consulted and Agree with Plan of Care Patient           Patient will benefit from skilled therapeutic intervention in order to improve the following deficits and impairments:  Pain,Decreased activity tolerance,Decreased strength,Decreased range of motion  Visit Diagnosis: Chronic pain of right knee  Stiffness of right knee, not elsewhere  classified     Problem List  Patient Active Problem List   Diagnosis Date Noted  . Paresthesia 06/14/2019  . Diabetic autonomic neuropathy associated with type 2 diabetes mellitus ([place]) 06/14/2019  . Atypical chest pain 05/03/2019  . TIA (transient ischemic attack) 05/03/2019  . Unsteady gait 11/03/2018  . Cerebrovascular accident (CVA) ([place]) 11/03/2018  . Cerebellar ataxia in diseases classified elsewhere ([place]) 11/03/2018  . 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 Aortic atherosclerosis ([place]) 12/08/2017  . Hardening of the aorta (main artery of the heart) ([place]) 12/08/2017  . 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[place]) 01/14/2015  . Depression 02/28/2013  . GERD (gastroesophageal reflux disease) 02/28/2013  . Hiatal hernia with gastroesophageal reflux 02/28/2013  . Peripheral neuropathy 02/28/2013  . DDD (degenerative disc disease), lumbosacral 06/23/2012  . Fibromyalgia syndrome 06/23/2012  . Essential hypertension, benign 06/23/2012  . Osteopenia 06/23/2012  . Diabetes ([place]) 06/23/2012  . Abnormal EKG 10/29/2010  . Obesity due to excess calories 10/29/2010  . 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 DYSPHAGIA 10/11/2009  . PERSONAL HISTORY OF COLONIC POLYPS 10/11/2009    Dorn Hartshorne,CHRIS, PTA 06/04/2020, 10:36 AM  [facility] [place], [place], [address] Phone: [phone]   Fax:  [phone]  Name: [name] MRN: [id number] Date of Birth: [birth year]

## 2020-06-06 ENCOUNTER — Other Ambulatory Visit: Payer: Self-pay

## 2020-06-06 ENCOUNTER — Encounter: Payer: Self-pay | Admitting: Physical Therapy

## 2020-06-06 ENCOUNTER — Other Ambulatory Visit: Payer: Self-pay | Admitting: Family

## 2020-06-06 ENCOUNTER — Other Ambulatory Visit: Payer: Self-pay | Admitting: Family Medicine

## 2020-06-06 ENCOUNTER — Ambulatory Visit: Payer: Medicare Other | Admitting: Physical Therapy

## 2020-06-06 DIAGNOSIS — G8929 Other chronic pain: Secondary | ICD-10-CM | POA: Diagnosis not present

## 2020-06-06 DIAGNOSIS — M25561 Pain in right knee: Secondary | ICD-10-CM | POA: Diagnosis not present

## 2020-06-06 DIAGNOSIS — M25661 Stiffness of right knee, not elsewhere classified: Secondary | ICD-10-CM

## 2020-06-06 DIAGNOSIS — F320 Major depressive disorder, single episode, mild: Secondary | ICD-10-CM

## 2020-06-06 NOTE — Therapy (Signed)
[facility] [place], [place], [address] Phone: [phone]   Fax:  [phone]  Physical Therapy Treatment   Progress Note Reporting Period 05/07/2020 to 06/06/2020  See note below for Objective Data and Assessment of Progress/Goals. Patient responding well to therapy but still with some pain with coming to standing. Gabriela Eves, PT, DPT     Patient Details  Name: [name] MRN: [id number] Date of Birth: Sep 01, 1945 Referring Provider (PT): Gaynelle Arabian MD   Encounter Date: 06/06/2020   PT End of Session - 06/06/20 0947    Visit Number 10    Number of Visits 14    Date for PT Re-Evaluation 06/18/20    PT Start Time 0946    PT Stop Time 1031    PT Time Calculation (min) 45 min    Activity Tolerance Patient tolerated treatment well    Behavior During Therapy [facility] for tasks assessed/performed           Past Medical History:  Diagnosis Date  . Allergy    seasonal  . Arthritis    [place]   . Colon polyps   . Complication of anesthesia    per pt, she woke up in the middle of last colon in 2014.  . Depression   . Diabetes mellitus, type 2 ([place]) 06/2010  . Diabetic neuropathy ([place])   . Diverticulosis    pt unaware  . Esophageal stricture   . Fibromyalgia    [place]   . GERD (gastroesophageal reflux disease)   . Hemorrhoids   . Hiatal hernia   . Hyperlipidemia   . Hypertension   . Menopause   . Numbness   . Peripheral vascular insufficiency ([place])   . Retinopathy    Bilateral  . TIA (transient ischemic attack)   . Tremor   . Vitamin D deficiency     Past Surgical History:  Procedure Laterality Date  . ABDOMINAL HYSTERECTOMY     partial and then total  . ANKLE SURGERY Right   . COLONOSCOPY    . ELBOW SURGERY     left  . KNEE ARTHROSCOPY Right 09/12/2018   Procedure: ARTHROSCOPY KNEE; SYNOVECTOMY;  Surgeon: Gaynelle Arabian, MD;  Location: [facility];  Service: Orthopedics;  Laterality: Right;   63min  . REFRACTIVE SURGERY  2003  . TOTAL KNEE ARTHROPLASTY     bilateral  . TOTAL KNEE REVISION Right 03/09/2018   Procedure: RIGHT TOTAL KNEE REVISION;  Surgeon: Gaynelle Arabian, MD;  Location: [facility];  Service: Orthopedics;  Laterality: Right;  152min with abductor block  . VESICOVAGINAL FISTULA CLOSURE W/ TAH  1981    There were no vitals filed for this visit.   Subjective Assessment - 06/06/20 0948    Subjective COVID-19 screen performed prior to patient entering clinic. Patient arrives doing well, 2/10 today. States she was a little sore after last session but states she really notices a difference with getting up.    Pertinent History H/o LBP, bilateral TKA's and revision on right, fibromyalgia, HTN, right ankle surgery.    How long can you stand comfortably? Short time.    How long can you walk comfortably? Short community distance.    Patient Stated Goals Reduce pain and be bale to do more.    Currently in Pain? Yes    Pain Score 2     Pain Location Knee    Pain Orientation Right    Pain Descriptors / Indicators Sore    Pain Type  Chronic pain    Pain Onset More than a month ago    Pain Frequency Constant              [facility] PT Assessment - 06/06/20 0001      Assessment   Medical Diagnosis Right total knee arthroplasty.    Referring Provider (PT) Gaynelle Arabian MD      Precautions   Precaution Comments No ultrasound.                         [place] Adult PT Treatment/Exercise - 06/06/20 0001      Exercises   Exercises Knee/Hip      Knee/Hip Exercises: Aerobic   Recumbent Bike level 3-5 x10 mins      Knee/Hip Exercises: Machines for Strengthening   Cybex Knee Extension 10# 3 x fatigue    Cybex Knee Flexion 30# 3x fatigue      Knee/Hip Exercises: Standing   Heel Raises Both;1 set;20 reps    Heel Raises Limitations Toe raises x 20      Vasopneumatic   Number Minutes Vasopneumatic  10 minutes    Vasopnuematic Location  Knee    Vasopneumatic  Pressure Low    Vasopneumatic Temperature  36 for edema                       PT Long Term Goals - 06/06/20 1010      PT LONG TERM GOAL #1   Title Ind with an HEP.    Time 4    Period Weeks    Status On-going      PT LONG TERM GOAL #2   Title Improve right knee extension by 10 degrees.    Time 4    Period Weeks    Status On-going      PT LONG TERM GOAL #3   Title Walk a community distance with pain not > 3-4/10 without an assistive device.    Time 4    Period Weeks    Status Achieved      PT LONG TERM GOAL #4   Title Stand 20 minutes with right knee pain not > 4/10.    Time 4    Period Weeks      PT LONG TERM GOAL #5   Title Patient will perform sit to stand transfer with right knee pain less than or equal to 4/10.    Time 4    Period Weeks    Status New                 Plan - 06/06/20 1028    Clinical Impression Statement Patient responded well to therapy session today with just reports of muscle fatigue. PROM provided into right knee extension with good response and no reports of pain with over pressure. ROM measured at 12-125 degrees. HEP goal is ongoing at this time secondary to back pain. Patient stated 4" and 6" step should be arriving soon where she can begin step up strenghtening. Normal response to modalities upon removal.    Personal Factors and Comorbidities Comorbidity 1;Comorbidity 2;Other    Comorbidities H/o LBP, bilateral TKA's and revision on right, fibromyalgia, HTN, right ankle surgery.    Examination-Activity Limitations Locomotion Level;Stand;Other    Examination-Participation Restrictions Other    Stability/Clinical Decision Making Evolving/Moderate complexity    Clinical Decision Making Low    Rehab Potential Good    PT Frequency 2x / week  PT Duration 4 weeks    PT Treatment/Interventions ADLs/Self Care Home Management;Cryotherapy;Electrical Stimulation;Ultrasound;Moist Heat;Iontophoresis 4mg /ml Dexamethasone;Gait  training;Stair training;Functional mobility training;Therapeutic activities;Therapeutic exercise;Manual techniques;Patient/family education;Passive range of motion;Dry needling    PT Next Visit Plan Right knee pain-free ther ex and extension stretching, STW/M to right hamstrings and quads.  Modalites (no [place] to knees) PRN.  Right patellar mobility.  Recert to cont. to meet LTGs    Consulted and Agree with Plan of Care Patient           Patient will benefit from skilled therapeutic intervention in order to improve the following deficits and impairments:  Pain,Decreased activity tolerance,Decreased strength,Decreased range of motion  Visit Diagnosis: Chronic pain of right knee  Stiffness of right knee, not elsewhere classified     Problem List Patient Active Problem List   Diagnosis Date Noted  . Paresthesia 06/14/2019  . Diabetic autonomic neuropathy associated with type 2 diabetes mellitus ([place]) 06/14/2019  . Atypical chest pain 05/03/2019  . TIA (transient ischemic attack) 05/03/2019  . Unsteady gait 11/03/2018  . Cerebrovascular accident (CVA) ([place]) 11/03/2018  . Cerebellar ataxia in diseases classified elsewhere ([place]) 11/03/2018  . 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 Aortic atherosclerosis ([place]) 12/08/2017  . Hardening of the aorta (main artery of the heart) ([place]) 12/08/2017  . 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[place]) 01/14/2015  . Depression 02/28/2013  . GERD (gastroesophageal reflux disease) 02/28/2013  . Hiatal hernia with gastroesophageal reflux 02/28/2013  . Peripheral neuropathy 02/28/2013  . DDD (degenerative disc disease), lumbosacral 06/23/2012  . Fibromyalgia syndrome 06/23/2012  . Essential hypertension, benign 06/23/2012  . Osteopenia 06/23/2012  . Diabetes ([place]) 06/23/2012  . Abnormal EKG 10/29/2010  . Obesity due to excess calories 10/29/2010  . 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 DYSPHAGIA 10/11/2009  . PERSONAL HISTORY OF  COLONIC POLYPS 10/11/2009    Gabriela Eves, PT, DPT 06/06/2020, 11:38 AM  [facility] [address] [place], [place], [address] Phone: [phone]   Fax:  [phone]  Name: [name] MRN: [id number] Date of Birth: 13-Aug-1945

## 2020-06-11 ENCOUNTER — Ambulatory Visit: Payer: Medicare Other | Admitting: Physical Therapy

## 2020-06-11 ENCOUNTER — Telehealth: Payer: Self-pay

## 2020-06-11 DIAGNOSIS — M25661 Stiffness of right knee, not elsewhere classified: Secondary | ICD-10-CM | POA: Diagnosis not present

## 2020-06-11 DIAGNOSIS — G8929 Other chronic pain: Secondary | ICD-10-CM | POA: Diagnosis not present

## 2020-06-11 DIAGNOSIS — M25561 Pain in right knee: Secondary | ICD-10-CM | POA: Diagnosis not present

## 2020-06-11 NOTE — Telephone Encounter (Signed)
[name] talked to patient about her Metformin

## 2020-06-11 NOTE — Therapy (Signed)
[facility] [place], [place], [address] Phone: [phone]   Fax:  [phone]  Physical Therapy Treatment  Patient Details  Name: [name] MRN: [id number] Date of Birth: 1945-10-26 Referring Provider (PT): Gaynelle Arabian MD   Encounter Date: 06/11/2020   PT End of Session - 06/11/20 1103    Visit Number 11    Number of Visits 14    Date for PT Re-Evaluation 06/18/20    PT Start Time 0948    PT Stop Time 1041    PT Time Calculation (min) 53 min    Activity Tolerance Patient tolerated treatment well    Behavior During Therapy [facility] for tasks assessed/performed           Past Medical History:  Diagnosis Date  . Allergy    seasonal  . Arthritis    [place]   . Colon polyps   . Complication of anesthesia    per pt, she woke up in the middle of last colon in 2014.  . Depression   . Diabetes mellitus, type 2 ([place]) 06/2010  . Diabetic neuropathy ([place])   . Diverticulosis    pt unaware  . Esophageal stricture   . Fibromyalgia    [place]   . GERD (gastroesophageal reflux disease)   . Hemorrhoids   . Hiatal hernia   . Hyperlipidemia   . Hypertension   . Menopause   . Numbness   . Peripheral vascular insufficiency ([place])   . Retinopathy    Bilateral  . TIA (transient ischemic attack)   . Tremor   . Vitamin D deficiency     Past Surgical History:  Procedure Laterality Date  . ABDOMINAL HYSTERECTOMY     partial and then total  . ANKLE SURGERY Right   . COLONOSCOPY    . ELBOW SURGERY     left  . KNEE ARTHROSCOPY Right 09/12/2018   Procedure: ARTHROSCOPY KNEE; SYNOVECTOMY;  Surgeon: Gaynelle Arabian, MD;  Location: [facility];  Service: Orthopedics;  Laterality: Right;  83min  . REFRACTIVE SURGERY  2003  . TOTAL KNEE ARTHROPLASTY     bilateral  . TOTAL KNEE REVISION Right 03/09/2018   Procedure: RIGHT TOTAL KNEE REVISION;  Surgeon: Gaynelle Arabian, MD;  Location: [facility];  Service: Orthopedics;  Laterality:  Right;  163min with abductor block  . VESICOVAGINAL FISTULA CLOSURE W/ TAH  1981    There were no vitals filed for this visit.   Subjective Assessment - 06/11/20 1030    Subjective COVID-19 screen performed prior to patient entering clinic.  Treatments helping.    Pertinent History H/o LBP, bilateral TKA's and revision on right, fibromyalgia, HTN, right ankle surgery.    How long can you stand comfortably? Short time.    How long can you walk comfortably? Short community distance.    Patient Stated Goals Reduce pain and be able to do more.    Currently in Pain? Yes    Pain Score 2     Pain Location Knee    Pain Type Chronic pain    Pain Onset More than a month ago                             [facility] Adult PT Treatment/Exercise - 06/11/20 0001      Exercises   Exercises Knee/Hip      Knee/Hip Exercises: Aerobic   Recumbent Bike Level 3 to 5 x 16 minutes.  Knee/Hip Exercises: Machines for Strengthening   Cybex Knee Extension 10# x 3 minutes.    Cybex Knee Flexion 30# x 3 minutes.      Vasopneumatic   Number Minutes Vasopneumatic  15 minutes    Vasopnuematic Location  --   Right knee.   Vasopneumatic Pressure Low      Manual Therapy   Manual Therapy Soft tissue mobilization    Soft tissue mobilization STW/M x 10 minutes to right quads to decrease tone.                       PT Long Term Goals - 06/06/20 1010      PT LONG TERM GOAL #1   Title Ind with an HEP.    Time 4    Period Weeks    Status On-going      PT LONG TERM GOAL #2   Title Improve right knee extension by 10 degrees.    Time 4    Period Weeks    Status On-going      PT LONG TERM GOAL #3   Title Walk a community distance with pain not > 3-4/10 without an assistive device.    Time 4    Period Weeks    Status Achieved      PT LONG TERM GOAL #4   Title Stand 20 minutes with right knee pain not > 4/10.    Time 4    Period Weeks      PT LONG TERM GOAL #5   Title  Patient will perform sit to stand transfer with right knee pain less than or equal to 4/10.    Time 4    Period Weeks    Status New                 Plan - 06/11/20 1057    Clinical Impression Statement Patient doing well with treatments.  She has tenderness to her right vastus lateralis.  She is tolerating ther ex well with increasing resistance.    Personal Factors and Comorbidities Comorbidity 1;Comorbidity 2;Other    Comorbidities H/o LBP, bilateral TKA's and revision on right, fibromyalgia, HTN, right ankle surgery.    Examination-Activity Limitations Locomotion Level;Stand;Other    Examination-Participation Restrictions Other    Stability/Clinical Decision Making Evolving/Moderate complexity    Rehab Potential Good    PT Frequency 2x / week    PT Duration 4 weeks    PT Treatment/Interventions ADLs/Self Care Home Management;Cryotherapy;Electrical Stimulation;Ultrasound;Moist Heat;Iontophoresis 4mg /ml Dexamethasone;Gait training;Stair training;Functional mobility training;Therapeutic activities;Therapeutic exercise;Manual techniques;Patient/family education;Passive range of motion;Dry needling    PT Next Visit Plan Right knee pain-free ther ex and extension stretching, STW/M to right hamstrings and quads.  Modalites (no [place] to knees) PRN.  Right patellar mobility.  Recert to cont. to meet LTGs    Consulted and Agree with Plan of Care Patient           Patient will benefit from skilled therapeutic intervention in order to improve the following deficits and impairments:  Pain,Decreased activity tolerance,Decreased strength,Decreased range of motion  Visit Diagnosis: Chronic pain of right knee  Stiffness of right knee, not elsewhere classified     Problem List Patient Active Problem List   Diagnosis Date Noted  . Paresthesia 06/14/2019  . Diabetic autonomic neuropathy associated with type 2 diabetes mellitus ([place]) 06/14/2019  . Atypical chest pain 05/03/2019  . TIA  (transient ischemic attack) 05/03/2019  . Unsteady gait 11/03/2018  . Cerebrovascular accident (CVA) (  [place]) 11/03/2018  . Cerebellar ataxia in diseases classified elsewhere ([place]) 11/03/2018  . Failed total knee arthroplasty ([place]) 03/09/2018  . Aortic atherosclerosis ([place]) 12/08/2017  . Hardening of the aorta (main artery of the heart) ([place]) 12/08/2017  . 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[place]) 01/14/2015  . Depression 02/28/2013  . GERD (gastroesophageal reflux disease) 02/28/2013  . Hiatal hernia with gastroesophageal reflux 02/28/2013  . Peripheral neuropathy 02/28/2013  . DDD (degenerative disc disease), lumbosacral 06/23/2012  . Fibromyalgia syndrome 06/23/2012  . Essential hypertension, benign 06/23/2012  . Osteopenia 06/23/2012  . Diabetes ([place]) 06/23/2012  . Abnormal EKG 10/29/2010  . Obesity due to excess calories 10/29/2010  . 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 DYSPHAGIA 10/11/2009  . PERSONAL HISTORY OF COLONIC POLYPS 10/11/2009    Jerene Yeager, [place] MPT 06/11/2020, 11:05 AM  [facility] [address] [place], [place], [address] Phone: [phone]   Fax:  [phone]  Name: [name] MRN: [id number] Date of Birth: April 13, 1945

## 2020-06-13 ENCOUNTER — Ambulatory Visit: Payer: Medicare Other | Admitting: Physical Therapy

## 2020-06-13 ENCOUNTER — Other Ambulatory Visit: Payer: Self-pay

## 2020-06-13 DIAGNOSIS — G8929 Other chronic pain: Secondary | ICD-10-CM

## 2020-06-13 DIAGNOSIS — M25661 Stiffness of right knee, not elsewhere classified: Secondary | ICD-10-CM

## 2020-06-13 DIAGNOSIS — M25561 Pain in right knee: Secondary | ICD-10-CM | POA: Diagnosis not present

## 2020-06-13 NOTE — Therapy (Signed)
[facility] [place], [place], [address] Phone: [phone]   Fax:  [phone]  Physical Therapy Treatment  Patient Details  Name: AKAYA PROFFIT MRN: [id number] Date of Birth: 09/19/1945 Referring Provider (PT): Gaynelle Arabian MD   Encounter Date: 06/13/2020   PT End of Session - 06/13/20 1029    Visit Number 12    Number of Visits 14    Date for PT Re-Evaluation 06/18/20    PT Start Time 0946    PT Stop Time 1033    PT Time Calculation (min) 47 min    Activity Tolerance Patient tolerated treatment well    Behavior During Therapy [facility] for tasks assessed/performed           Past Medical History:  Diagnosis Date  . Allergy    seasonal  . Arthritis    [place]   . Colon polyps   . Complication of anesthesia    per pt, she woke up in the middle of last colon in 2014.  . Depression   . Diabetes mellitus, type 2 ([place]) 06/2010  . Diabetic neuropathy ([place])   . Diverticulosis    pt unaware  . Esophageal stricture   . Fibromyalgia    [place]   . GERD (gastroesophageal reflux disease)   . Hemorrhoids   . Hiatal hernia   . Hyperlipidemia   . Hypertension   . Menopause   . Numbness   . Peripheral vascular insufficiency ([place])   . Retinopathy    Bilateral  . TIA (transient ischemic attack)   . Tremor   . Vitamin D deficiency     Past Surgical History:  Procedure Laterality Date  . ABDOMINAL HYSTERECTOMY     partial and then total  . ANKLE SURGERY Right   . COLONOSCOPY    . ELBOW SURGERY     left  . KNEE ARTHROSCOPY Right 09/12/2018   Procedure: ARTHROSCOPY KNEE; SYNOVECTOMY;  Surgeon: Gaynelle Arabian, MD;  Location: [facility];  Service: Orthopedics;  Laterality: Right;  62min  . REFRACTIVE SURGERY  2003  . TOTAL KNEE ARTHROPLASTY     bilateral  . TOTAL KNEE REVISION Right 03/09/2018   Procedure: RIGHT TOTAL KNEE REVISION;  Surgeon: Gaynelle Arabian, MD;  [facility];  Service: Orthopedics;  Laterality:  Right;  161min with abductor block  . VESICOVAGINAL FISTULA CLOSURE W/ TAH  1981    There were no vitals filed for this visit.   Subjective Assessment - 06/13/20 1026    Subjective COVID-19 screen performed prior to patient entering clinic.  No new complaints.    Pertinent History H/o LBP, bilateral TKA's and revision on right, fibromyalgia, HTN, right ankle surgery.    How long can you stand comfortably? Short time.    How long can you walk comfortably? Short community distance.    Currently in Pain? Yes    Pain Score 2     Pain Location Knee    Pain Orientation Right    Pain Descriptors / Indicators Sore    Pain Type Chronic pain    Pain Onset More than a month ago                             [facility] Adult PT Treatment/Exercise - 06/13/20 0001      Exercises   Exercises Knee/Hip      Knee/Hip Exercises: Aerobic   Recumbent Bike Level 5 x 13 minutes.  Knee/Hip Exercises: Machines for Strengthening   Cybex Knee Extension 10# x 2 minutes.    Cybex Knee Flexion 30# x 2 minutes.      Modalities   Modalities Vasopneumatic      Vasopneumatic   Number Minutes Vasopneumatic  15 minutes    Vasopnuematic Location  --   Right knee.   Vasopneumatic Pressure Low      Manual Therapy   Manual Therapy Soft tissue mobilization    Soft tissue mobilization STW/M x 10 minutes to patient's right quad musculature to reduce tone.                       PT Long Term Goals - 06/06/20 1010      PT LONG TERM GOAL #1   Title Ind with an HEP.    Time 4    Period Weeks    Status On-going      PT LONG TERM GOAL #2   Title Improve right knee extension by 10 degrees.    Time 4    Period Weeks    Status On-going      PT LONG TERM GOAL #3   Title Walk a community distance with pain not > 3-4/10 without an assistive device.    Time 4    Period Weeks    Status Achieved      PT LONG TERM GOAL #4   Title Stand 20 minutes with right knee pain not > 4/10.     Time 4    Period Weeks      PT LONG TERM GOAL #5   Title Patient will perform sit to stand transfer with right knee pain less than or equal to 4/10.    Time 4    Period Weeks    Status New                 Plan - 06/13/20 1030    Clinical Impression Statement Patient did well today.  Patient's right knee pain-level has been consistently lower over the last couple of days.    Personal Factors and Comorbidities Comorbidity 1;Comorbidity 2;Other    Comorbidities H/o LBP, bilateral TKA's and revision on right, fibromyalgia, HTN, right ankle surgery.    Examination-Activity Limitations Locomotion Level;Stand;Other    Examination-Participation Restrictions Other    Stability/Clinical Decision Making Evolving/Moderate complexity    Rehab Potential Good    PT Frequency 2x / week    PT Duration 4 weeks    PT Treatment/Interventions ADLs/Self Care Home Management;Cryotherapy;Electrical Stimulation;Ultrasound;Moist Heat;Iontophoresis 4mg /ml Dexamethasone;Gait training;Stair training;Functional mobility training;Therapeutic activities;Therapeutic exercise;Manual techniques;Patient/family education;Passive range of motion;Dry needling    PT Next Visit Plan Right knee pain-free ther ex and extension stretching, STW/M to right hamstrings and quads.  Modalites (no [place] to knees) PRN.  Right patellar mobility.    Consulted and Agree with Plan of Care Patient           Patient will benefit from skilled therapeutic intervention in order to improve the following deficits and impairments:  Pain,Decreased activity tolerance,Decreased strength,Decreased range of motion  Visit Diagnosis: Chronic pain of right knee  Stiffness of right knee, not elsewhere classified     Problem List Patient Active Problem List   Diagnosis Date Noted  . Paresthesia 06/14/2019  . Diabetic autonomic neuropathy associated with type 2 diabetes mellitus ([place]) 06/14/2019  . Atypical chest pain 05/03/2019  . TIA  (transient ischemic attack) 05/03/2019  . Unsteady gait 11/03/2018  . Cerebrovascular accident (CVA) (  [place]) 11/03/2018  . Cerebellar ataxia in diseases classified elsewhere ([place]) 11/03/2018  . Failed total knee arthroplasty ([place]) 03/09/2018  . Aortic atherosclerosis ([place]) 12/08/2017  . Hardening of the aorta (main artery of the heart) ([place]) 12/08/2017  . 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[place]) 01/14/2015  . Depression 02/28/2013  . GERD (gastroesophageal reflux disease) 02/28/2013  . Hiatal hernia with gastroesophageal reflux 02/28/2013  . Peripheral neuropathy 02/28/2013  . DDD (degenerative disc disease), lumbosacral 06/23/2012  . Fibromyalgia syndrome 06/23/2012  . Essential hypertension, benign 06/23/2012  . Osteopenia 06/23/2012  . Diabetes ([place]) 06/23/2012  . Abnormal EKG 10/29/2010  . Obesity due to excess calories 10/29/2010  . 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 DYSPHAGIA 10/11/2009  . PERSONAL HISTORY OF COLONIC POLYPS 10/11/2009    Brennen Gardiner, [place] MPT 06/13/2020, 10:37 AM  [facility] [address] [place], [place], [address] Phone: [phone]   Fax:  [phone]  Name: HARLEA GOETZINGER MRN: [id number] Date of Birth: 04-22-1945

## 2020-06-18 ENCOUNTER — Other Ambulatory Visit: Payer: Self-pay

## 2020-06-18 ENCOUNTER — Ambulatory Visit: Payer: Medicare Other | Admitting: *Deleted

## 2020-06-18 DIAGNOSIS — G8929 Other chronic pain: Secondary | ICD-10-CM | POA: Diagnosis not present

## 2020-06-18 DIAGNOSIS — M25661 Stiffness of right knee, not elsewhere classified: Secondary | ICD-10-CM

## 2020-06-18 DIAGNOSIS — M25561 Pain in right knee: Secondary | ICD-10-CM | POA: Diagnosis not present

## 2020-06-18 NOTE — Therapy (Signed)
[facility] [place], [place], [address] Phone: [phone]   Fax:  [phone]  Physical Therapy Treatment  Patient Details  Name: [name] MRN: [id number] Date of Birth: [birth year] Referring Provider (PT): Gaynelle Arabian MD   Encounter Date: 06/18/2020   PT End of Session - 06/18/20 1019    Visit Number 13    Number of Visits 14    Date for PT Re-Evaluation 06/18/20    PT Start Time 0945    PT Stop Time 2751    PT Time Calculation (min) 44 min           Past Medical History:  Diagnosis Date  . Allergy    seasonal  . Arthritis    [place]   . Colon polyps   . Complication of anesthesia    per pt, she woke up in the middle of last colon in 2014.  . Depression   . Diabetes mellitus, type 2 ([place]) 06/2010  . Diabetic neuropathy ([place])   . Diverticulosis    pt unaware  . Esophageal stricture   . Fibromyalgia    [place]   . GERD (gastroesophageal reflux disease)   . Hemorrhoids   . Hiatal hernia   . Hyperlipidemia   . Hypertension   . Menopause   . Numbness   . Peripheral vascular insufficiency ([place])   . Retinopathy    Bilateral  . TIA (transient ischemic attack)   . Tremor   . Vitamin D deficiency     Past Surgical History:  Procedure Laterality Date  . ABDOMINAL HYSTERECTOMY     partial and then total  . ANKLE SURGERY Right   . COLONOSCOPY    . ELBOW SURGERY     left  . KNEE ARTHROSCOPY Right 09/12/2018   Procedure: ARTHROSCOPY KNEE; SYNOVECTOMY;  Surgeon: Gaynelle Arabian, MD;  [facility];  Service: Orthopedics;  Laterality: Right;  60min  . REFRACTIVE SURGERY  2003  . TOTAL KNEE ARTHROPLASTY     bilateral  . TOTAL KNEE REVISION Right 03/09/2018   Procedure: RIGHT TOTAL KNEE REVISION;  Surgeon: Gaynelle Arabian, MD;  [facility];  Service: Orthopedics;  Laterality: Right;  170min with abductor block  . VESICOVAGINAL FISTULA CLOSURE W/ TAH  1981    There were no vitals filed for  this visit.   Subjective Assessment - 06/18/20 1017    Subjective COVID-19 screen performed prior to patient entering clinic.  Feeling dizzy at times again today. RT knee pain 4/10    Pertinent History H/o LBP, bilateral TKA's and revision on right, fibromyalgia, HTN, right ankle surgery.    How long can you stand comfortably? Short time.    How long can you walk comfortably? Short community distance.    Patient Stated Goals Reduce pain and be able to do more.    Currently in Pain? Yes    Pain Score 4     Pain Location Knee    Pain Orientation Right    Pain Descriptors / Indicators Sore    Pain Type Chronic pain    Pain Onset More than a month ago                                          PT Long Term Goals - 06/06/20 1010      PT LONG TERM GOAL #1   Title Ind  with an HEP.    Time 4    Period Weeks    Status On-going      PT LONG TERM GOAL #2   Title Improve right knee extension by 10 degrees.    Time 4    Period Weeks    Status On-going      PT LONG TERM GOAL #3   Title Walk a community distance with pain not > 3-4/10 without an assistive device.    Time 4    Period Weeks    Status Achieved      PT LONG TERM GOAL #4   Title Stand 20 minutes with right knee pain not > 4/10.    Time 4    Period Weeks      PT LONG TERM GOAL #5   Title Patient will perform sit to stand transfer with right knee pain less than or equal to 4/10.    Time 4    Period Weeks    Status New                 Plan - 06/18/20 1021    Clinical Impression Statement Pt arrived today doing fair. She reports knee pain 4/10 and having some dizzy spells. She was able to perform RT knee strengthening exs and did well.Vaso end of Rx.    Personal Factors and Comorbidities Comorbidity 1;Comorbidity 2;Other    Comorbidities H/o LBP, bilateral TKA's and revision on right, fibromyalgia, HTN, right ankle surgery.    Examination-Participation Restrictions Other     Stability/Clinical Decision Making Evolving/Moderate complexity    Rehab Potential Good    PT Frequency 2x / week    PT Duration 4 weeks    PT Treatment/Interventions ADLs/Self Care Home Management;Cryotherapy;Electrical Stimulation;Ultrasound;Moist Heat;Iontophoresis 4mg /ml Dexamethasone;Gait training;Stair training;Functional mobility training;Therapeutic activities;Therapeutic exercise;Manual techniques;Patient/family education;Passive range of motion;Dry needling    PT Next Visit Plan Right knee pain-free ther ex and extension stretching, STW/M to right hamstrings and quads.  Modalites (no [place] to knees) PRN.  Right patellar mobility.           Patient will benefit from skilled therapeutic intervention in order to improve the following deficits and impairments:  Pain,Decreased activity tolerance,Decreased strength,Decreased range of motion  Visit Diagnosis: Chronic pain of right knee  Stiffness of right knee, not elsewhere classified     Problem List Patient Active Problem List   Diagnosis Date Noted  . Paresthesia 06/14/2019  . Diabetic autonomic neuropathy associated with type 2 diabetes mellitus ([place]) 06/14/2019  . Atypical chest pain 05/03/2019  . TIA (transient ischemic attack) 05/03/2019  . Unsteady gait 11/03/2018  . Cerebrovascular accident (CVA) ([place]) 11/03/2018  . Cerebellar ataxia in diseases classified elsewhere ([place]) 11/03/2018  . Failed total knee arthroplasty ([place]) 03/09/2018  . Aortic atherosclerosis ([place]) 12/08/2017  . Hardening of the aorta (main artery of the heart) ([place]) 12/08/2017  . 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[place]) 01/14/2015  . Depression 02/28/2013  . GERD (gastroesophageal reflux disease) 02/28/2013  . Hiatal hernia with gastroesophageal reflux 02/28/2013  . Peripheral neuropathy 02/28/2013  . DDD (degenerative disc disease), lumbosacral 06/23/2012  . Fibromyalgia syndrome 06/23/2012  . Essential hypertension, benign 06/23/2012  . Osteopenia  06/23/2012  . Diabetes ([place]) 06/23/2012  . Abnormal EKG 10/29/2010  . Obesity due to excess calories 10/29/2010  . 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 DYSPHAGIA 10/11/2009  . PERSONAL HISTORY OF COLONIC POLYPS 10/11/2009    [name],CHRIS, PTA 06/18/2020, 10:30 AM  [facility]  [place], [place], [address] Phone: [phone]   Fax:  [phone]  Name: [name] MRN: [id number] Date of Birth: December 04, 1945

## 2020-06-20 ENCOUNTER — Ambulatory Visit: Payer: Medicare Other | Admitting: *Deleted

## 2020-06-20 ENCOUNTER — Other Ambulatory Visit: Payer: Self-pay

## 2020-06-20 DIAGNOSIS — M25661 Stiffness of right knee, not elsewhere classified: Secondary | ICD-10-CM | POA: Diagnosis not present

## 2020-06-20 DIAGNOSIS — M25561 Pain in right knee: Secondary | ICD-10-CM | POA: Diagnosis not present

## 2020-06-20 DIAGNOSIS — G8929 Other chronic pain: Secondary | ICD-10-CM | POA: Diagnosis not present

## 2020-06-20 NOTE — Therapy (Signed)
[name], [name], [id number] Phone: [phone]   Fax:  [phone]  Physical Therapy Treatment  Patient Details  Name: [name] MRN: [id number] Date of Birth: 1946/01/15 Referring Provider (PT): Gaynelle Arabian MD   Encounter Date: 06/20/2020   PT End of Session - 06/20/20 1001    Visit Number 14    Number of Visits 14    Date for PT Re-Evaluation 06/18/20    PT Start Time 0948    PT Stop Time 1038    PT Time Calculation (min) 50 min           Past Medical History:  Diagnosis Date  . Allergy    seasonal  . Arthritis    [name]   . Colon polyps   . Complication of anesthesia    per pt, she woke up in the middle of last colon in 2014.  . Depression   . Diabetes mellitus, type 2 ([name]) 06/2010  . Diabetic neuropathy ([name])   . Diverticulosis    pt unaware  . Esophageal stricture   . Fibromyalgia    [name]   . GERD (gastroesophageal reflux disease)   . Hemorrhoids   . Hiatal hernia   . Hyperlipidemia   . Hypertension   . Menopause   . Numbness   . Peripheral vascular insufficiency ([name])   . Retinopathy    Bilateral  . TIA (transient ischemic attack)   . Tremor   . Vitamin D deficiency     Past Surgical History:  Procedure Laterality Date  . ABDOMINAL HYSTERECTOMY     partial and then total  . ANKLE SURGERY Right   . COLONOSCOPY    . ELBOW SURGERY     left  . KNEE ARTHROSCOPY Right 09/12/2018   Procedure: ARTHROSCOPY KNEE; SYNOVECTOMY;  Surgeon: Gaynelle Arabian, MD;  Location: [facility];  Service: Orthopedics;  Laterality: Right;  63mn  . REFRACTIVE SURGERY  2003  . TOTAL KNEE ARTHROPLASTY     bilateral  . TOTAL KNEE REVISION Right 03/09/2018   Procedure: RIGHT TOTAL KNEE REVISION;  Surgeon: AGaynelle Arabian MD;  Location: [facility];  Service: Orthopedics;  Laterality: Right;  12108m with abductor block  . VESICOVAGINAL FISTULA CLOSURE W/ TAH  1981    There were no vitals filed for  this visit.   Subjective Assessment - 06/20/20 1000    Subjective COVID-19 screen performed prior to patient entering clinic.  RT knee pain 4/10. DC today    Pertinent History H/o LBP, bilateral TKA's and revision on right, fibromyalgia, HTN, right ankle surgery.                             [facility] 0001      Exercises   Exercises Knee/Hip      Knee/Hip Exercises: Aerobic   Recumbent Bike Level 5 x 10 minutes.      Knee/Hip Exercises: Standing   Heel Raises Both;2 sets    Heel Raises Limitations Toe raises 2 x 20    Forward Step Up Right;1 set;10 reps;Step Height: 6";Hand Hold: 2    Step Down 1 set;10 reps;Step Height: 6";Hand Hold: 2    SLS RT SLS      Knee/Hip Exercises: Seated   Long Arc Quad Right;3 sets;20 reps;Weights    Long Arc Quad Weight 5 lbs.      Modalities   Modalities Vasopneumatic  Vasopneumatic   Number Minutes Vasopneumatic  15 minutes    Vasopnuematic Location  Knee    Vasopneumatic Pressure Low    Vasopneumatic Temperature  36 for edema                       PT Long Term Goals - 06/20/20 1037      PT LONG TERM GOAL #1   Title Ind with an HEP.    Time 4    Period Weeks    Status Achieved      PT LONG TERM GOAL #2   Title Improve right knee extension by 10 degrees.    Time 4    Period Weeks    Status Achieved      PT LONG TERM GOAL #3   Title Walk a community distance with pain not > 3-4/10 without an assistive device.    Time 4    Period Weeks    Status Achieved      PT LONG TERM GOAL #4   Title Stand 20 minutes with right knee pain not > 4/10.    Period Weeks    Status Achieved      PT LONG TERM GOAL #5   Title Patient will perform sit to stand transfer with right knee pain less than or equal to 4/10.    Time 4    Period Weeks    Status Achieved                 Plan - 06/20/20 1040    Clinical Impression Statement Pt arrived today doing better with  less RT knee pain. Today is her last day so Rx focused on independent HEP for RT LE strengthening and balance. Pt did great and was able to meet all LTGs    Personal Factors and Comorbidities Comorbidity 1;Comorbidity 2;Other    Comorbidities H/o LBP, bilateral TKA's and revision on right, fibromyalgia, HTN, right ankle surgery.    Examination-Activity Limitations Locomotion Level;Stand;Other    Examination-Participation Restrictions Other    Stability/Clinical Decision Making Evolving/Moderate complexity    Rehab Potential Good    PT Frequency 2x / week    PT Duration 4 weeks    PT Treatment/Interventions ADLs/Self Care Home Management;Cryotherapy;Electrical Stimulation;Ultrasound;Moist Heat;Iontophoresis 46m/ml Dexamethasone;Gait training;Stair training;Functional mobility training;Therapeutic activities;Therapeutic exercise;Manual techniques;Patient/family education;Passive range of motion;Dry needling    PT Next Visit Plan Right knee pain-free ther ex and extension stretching, STW/M to right hamstrings and quads.  Modalites (no [place] knees) PRN.  Right patellar mobility.    Consulted and Agree with Plan of Care Patient           Patient will benefit from skilled therapeutic intervention in order to improve the following deficits and impairments:  Pain,Decreased activity tolerance,Decreased strength,Decreased range of motion  Visit Diagnosis: Chronic pain of right knee  Stiffness of right knee, not elsewhere classified     Problem List Patient Active Problem List   Diagnosis Date Noted  . Paresthesia 06/14/2019  . Diabetic autonomic neuropathy associated with type 2 diabetes mellitus ([place] 06/14/2019  . Atypical chest pain 05/03/2019  . TIA (transient ischemic attack) 05/03/2019  . Unsteady gait 11/03/2018  . Cerebrovascular accident (CVA) ([facility] [place] 11/03/2018  . Cerebellar ataxia in diseases classified elsewhere ([facility] 11/03/2018  . Failed total knee arthroplasty ([place] 03/09/2018   . Aortic atherosclerosis ([facility] 12/08/2017  . Hardening of the aorta (main artery of the heart) ([facility] 12/08/2017  . 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[facility] 01/14/2015  .  Depression 02/28/2013  . GERD (gastroesophageal reflux disease) 02/28/2013  . Hiatal hernia with gastroesophageal reflux 02/28/2013  . Peripheral neuropathy 02/28/2013  . DDD (degenerative disc disease), lumbosacral 06/23/2012  . Fibromyalgia syndrome 06/23/2012  . Essential hypertension, benign 06/23/2012  . Osteopenia 06/23/2012  . Diabetes ([place]) 06/23/2012  . Abnormal EKG 10/29/2010  . Obesity due to excess calories 10/29/2010  . 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 DYSPHAGIA 10/11/2009  . PERSONAL HISTORY OF COLONIC POLYPS 10/11/2009    Erin Uecker,CHRIS, PTA 06/20/2020, 11:24 AM  [facility] [place], [name], [id number] Phone: [phone]   Fax:  [phone]  Name: SHEILAH RAYOS MRN: [id number] Date of Birth: January 29, 1946  PHYSICAL THERAPY DISCHARGE SUMMARY  Visits from Start of Care: 14.  Current functional level related to goals / functional outcomes: See above.   Remaining deficits: All goals met.   Education / Equipment: HEP. Plan: Patient agrees to discharge.  Patient goals were met. Patient is being discharged due to meeting the stated rehab goals.  ?????          Mali Applegate MPT

## 2020-06-24 ENCOUNTER — Other Ambulatory Visit: Payer: Self-pay

## 2020-06-24 ENCOUNTER — Ambulatory Visit: Payer: Medicare Other | Admitting: Family Medicine

## 2020-06-24 ENCOUNTER — Encounter: Payer: Self-pay | Admitting: Family Medicine

## 2020-06-24 VITALS — BP 120/75 | HR 87 | Ht 66.0 in | Wt 207.0 lb

## 2020-06-24 DIAGNOSIS — E1142 Type 2 diabetes mellitus with diabetic polyneuropathy: Secondary | ICD-10-CM | POA: Diagnosis not present

## 2020-06-24 DIAGNOSIS — E11610 Type 2 diabetes mellitus with diabetic neuropathic arthropathy: Secondary | ICD-10-CM

## 2020-06-24 DIAGNOSIS — I10 Essential (primary) hypertension: Secondary | ICD-10-CM

## 2020-06-24 DIAGNOSIS — E1143 Type 2 diabetes mellitus with diabetic autonomic (poly)neuropathy: Secondary | ICD-10-CM

## 2020-06-24 DIAGNOSIS — F320 Major depressive disorder, single episode, mild: Secondary | ICD-10-CM

## 2020-06-24 DIAGNOSIS — M797 Fibromyalgia: Secondary | ICD-10-CM | POA: Diagnosis not present

## 2020-06-24 DIAGNOSIS — Z780 Asymptomatic menopausal state: Secondary | ICD-10-CM

## 2020-06-24 LAB — BAYER DCA HB A1C WAIVED: HB A1C (BAYER DCA - WAIVED): 7.1 % — ABNORMAL HIGH (ref <7.0)

## 2020-06-24 MED ORDER — ATORVASTATIN CALCIUM 10 MG PO TABS: 10.0000 mg | ORAL_TABLET | Freq: Every day | ORAL | 3 refills | Status: DC

## 2020-06-24 MED ORDER — GABAPENTIN 600 MG PO TABS: 600.0000 mg | ORAL_TABLET | Freq: Four times a day (QID) | ORAL | 3 refills | Status: DC

## 2020-06-24 MED ORDER — OZEMPIC (1 MG/DOSE) 4 MG/3ML ~~LOC~~ SOPN: PEN_INJECTOR | SUBCUTANEOUS | 3 refills | Status: DC

## 2020-06-24 MED ORDER — PREMARIN 0.625 MG/GM VA CREA
TOPICAL_CREAM | Freq: Every day | VAGINAL | 3 refills | Status: DC
Start: 2020-06-24 — End: 2021-09-24

## 2020-06-24 MED ORDER — GLIPIZIDE ER 5 MG PO TB24: 5.0000 mg | ORAL_TABLET | Freq: Every day | ORAL | 3 refills | Status: DC

## 2020-06-24 MED ORDER — ALPHA-LIPOIC ACID 600 MG PO TABS: 600.0000 mg | ORAL_TABLET | Freq: Every day | ORAL | 3 refills | Status: DC

## 2020-06-24 MED ORDER — ESCITALOPRAM OXALATE 20 MG PO TABS: 20.0000 mg | ORAL_TABLET | Freq: Every day | ORAL | 3 refills | Status: DC

## 2020-06-24 MED ORDER — LISINOPRIL-HYDROCHLOROTHIAZIDE 20-25 MG PO TABS: 1.0000 | ORAL_TABLET | Freq: Every day | ORAL | 3 refills | Status: DC

## 2020-06-24 MED ORDER — ARIPIPRAZOLE 2 MG PO TABS: 2.0000 mg | ORAL_TABLET | Freq: Every day | ORAL | 3 refills | Status: DC

## 2020-06-24 MED ORDER — SYNJARDY XR 25-1000 MG PO TB24: 1.0000 | ORAL_TABLET | Freq: Every day | ORAL | 3 refills | Status: DC

## 2020-06-24 NOTE — Progress Notes (Signed)
BP 120/75   Pulse 87   Ht 5' 6"  (1.676 m)   Wt 207 lb (93.9 kg)   SpO2 95%   BMI 33.41 kg/m    Subjective:   [name], female    DOB: [birth year], 75 y.o.   MRN: [id number]  HPI: [name] is a 75 y.o. female presenting on 06/24/2020 for Medical Management of Chronic Issues, Depression, Fatigue, and Shortness of Breath   HPI Patient has been having more back pain stuck at home and gets fatigued a lot easier when she gets up and moves around.  She denies any chest pain or significant short of breath when it happens but just says she gets fatigued easier and gives out.  Hypertension Patient is currently on lisinopril hydrochlorothiazide, and their blood pressure today is 120/75. Patient denies any lightheadedness or dizziness. Patient denies headaches, blurred vision, chest pains, shortness of breath, or weakness. Denies any side effects from medication and is content with current medication.   Type 2 diabetes mellitus Patient comes in today for recheck of his diabetes. Patient has been currently taking glipizide and Synjardy and Ozempic. Patient is currently on an ACE inhibitor/ARB. Patient has not seen an ophthalmologist this year. Patient says her neuropathy is worsened in her feet, she denies any sores or ulcers.. The symptom started onset as an adult peripheral neuropathy and hypertension and history of CVA and aortic atherosclerosis ARE RELATED TO DM   Fibromyalgia anxiety depression Patient says her fibromyalgia and anxiety depression has been doing better, she currently takes Lexapro and Abilify.  She says she gets depressed sometimes but only because of the pain she is fighting not initially because she has been feeling sad as much and she is been feeling a lot better about things.  Relevant past medical, surgical, family and social history reviewed and updated as indicated. Interim medical history since our last visit reviewed. Allergies and medications  reviewed and updated.  Review of Systems  Constitutional: Positive for fatigue. Negative for chills and fever.  HENT: Negative for congestion, ear discharge and ear pain.   Eyes: Negative for visual disturbance.  Respiratory: Negative for chest tightness and shortness of breath.   Cardiovascular: Negative for chest pain and leg swelling.  Genitourinary: Negative for difficulty urinating and dysuria.  Musculoskeletal: Negative for back pain and gait problem.  Skin: Negative for rash.  Neurological: Positive for weakness and numbness. Negative for light-headedness and headaches.  Psychiatric/Behavioral: Negative for agitation and behavioral problems.  All other systems reviewed and are negative.   Per HPI unless specifically indicated above   Allergies as of 06/24/2020      Reactions   Penicillins Other (See Comments)   Blisters in mouth Did it involve swelling of the face/tongue/throat, SOB, or low BP? No Did it involve sudden or severe rash/hives, skin peeling, or any reaction on the inside of your mouth or nose? No Did you need to seek medical attention at a hospital or doctor's office? No When did it last happen?Childhood allergy If all above answers are "NO", may proceed with cephalosporin use.   Codeine Nausea And Vomiting   Erythromycin Nausea And Vomiting   Fenofibrate Other (See Comments)   aching & edema    Lyrica [pregabalin] Other (See Comments)   Edema in feet   Statins Other (See Comments)   Joints ache   Sulfa Antibiotics Nausea And Vomiting      Medication List       Accurate  as of June 24, 2020  2:15 PM. If you have any questions, ask your nurse or doctor.        Alpha-Lipoic Acid 600 MG Tabs Take 600 mg by mouth daily. Started by: Worthy Rancher, MD   ALPRAZolam 0.5 MG tablet Commonly known as: XANAX Take 1 tablet (0.5 mg total) by mouth as needed for anxiety. Take 1-2 tabs daily as needed   ARIPiprazole 2 MG tablet Commonly known as:  ABILIFY Take 1 tablet (2 mg total) by mouth daily. What changed: how much to take   atorvastatin 10 MG tablet Commonly known as: LIPITOR Take 1 tablet (10 mg total) by mouth daily.   blood glucose meter kit and supplies Kit Dispense based on patient and insurance preference. Use up to two times daily as directed. (Dx: type 2 DM - E11.9)   clopidogrel 75 MG tablet Commonly known as: PLAVIX TAKE 1 TABLET DAILY   escitalopram 20 MG tablet Commonly known as: LEXAPRO Take 1 tablet (20 mg total) by mouth daily.   ezetimibe 10 MG tablet Commonly known as: ZETIA TAKE 1 TABLET DAILY   Fish Oil 1000 MG Caps Take by mouth. Take 2 pills daily   gabapentin 600 MG tablet Commonly known as: NEURONTIN Take 1 tablet (600 mg total) by mouth in the morning, at noon, in the evening, and at bedtime. What changed: See the new instructions. Changed by: Fransisca Kaufmann Dettinger, MD   glipiZIDE 5 MG 24 hr tablet Commonly known as: GLUCOTROL XL Take 1 tablet (5 mg total) by mouth daily with breakfast.   HYDROcodone-acetaminophen 5-325 MG tablet Commonly known as: NORCO/VICODIN Take 1 tablet by mouth every 6 (six) hours as needed for moderate pain.   lisinopril-hydrochlorothiazide 20-25 MG tablet Commonly known as: ZESTORETIC Take 1 tablet by mouth daily.   nicotine polacrilex 2 MG gum Commonly known as: NICORETTE Take 2 mg by mouth daily.   ONE TOUCH ULTRA 2 w/Device Kit CHECK BLOOD SUGAR UP TO TWICE DAILY Dx X54.0   OneTouch Delica Lancets 08Q Misc CHECK BLOOD SUGAR UP TO TWICE DAILY Dx E11.9   OneTouch Ultra test strip Generic drug: glucose blood Check BS up to 2 times daily Dx 11.43   Ozempic (1 MG/DOSE) 4 MG/3ML Sopn Generic drug: Semaglutide (1 MG/DOSE) INJECT 1 MG ONCE A WEEK AS DIRECTED   pantoprazole 40 MG tablet Commonly known as: PROTONIX Take 1 tablet (40 mg total) by mouth daily.   Premarin vaginal cream Generic drug: conjugated estrogens Place vaginally at  bedtime. What changed: See the new instructions. Changed by: Fransisca Kaufmann Dettinger, MD   Synjardy XR 25-1000 MG Tb24 Generic drug: Empagliflozin-metFORMIN HCl ER Take 1 tablet by mouth daily.   Vitamin D 50 MCG (2000 UT) tablet Take 2,000 Units by mouth daily.        Objective:   BP 120/75   Pulse 87   Ht 5' 6"  (1.676 m)   Wt 207 lb (93.9 kg)   SpO2 95%   BMI 33.41 kg/m   Wt Readings from Last 3 Encounters:  06/24/20 207 lb (93.9 kg)  05/31/20 206 lb (93.4 kg)  05/17/20 206 lb (93.4 kg)    Physical Exam Vitals and nursing note reviewed.  Constitutional:      General: She is not in acute distress.    Appearance: She is well-developed. She is not diaphoretic.  Eyes:     Conjunctiva/sclera: Conjunctivae normal.     Pupils: Pupils are equal, round, and reactive to  light.  Cardiovascular:     Rate and Rhythm: Normal rate and regular rhythm.     Heart sounds: Normal heart sounds. No murmur heard.   Pulmonary:     Effort: Pulmonary effort is normal. No respiratory distress.     Breath sounds: Normal breath sounds. No wheezing.  Musculoskeletal:        General: No tenderness. Normal range of motion.  Skin:    General: Skin is warm and dry.     Findings: No rash.  Neurological:     Mental Status: She is alert and oriented to person, place, and time.     Coordination: Coordination normal.  Psychiatric:        Behavior: Behavior normal.       Assessment & Plan:   Problem List Items Addressed This Visit      Cardiovascular and Mediastinum   Essential hypertension, benign (Chronic)   Relevant Medications   atorvastatin (LIPITOR) 10 MG tablet   lisinopril-hydrochlorothiazide (ZESTORETIC) 20-25 MG tablet     Endocrine   Diabetes (HCC) - Primary (Chronic)   Relevant Medications   Empagliflozin-metFORMIN HCl ER (SYNJARDY XR) 25-1000 MG TB24   glipiZIDE (GLUCOTROL XL) 5 MG 24 hr tablet   Semaglutide, 1 MG/DOSE, (OZEMPIC, 1 MG/DOSE,) 4 MG/3ML SOPN   atorvastatin  (LIPITOR) 10 MG tablet   lisinopril-hydrochlorothiazide (ZESTORETIC) 20-25 MG tablet   Other Relevant Orders   Bayer DCA Hb A1c Waived   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HCC)   Relevant Medications   ARIPiprazole (ABILIFY) 2 MG tablet   Empagliflozin-metFORMIN HCl ER (SYNJARDY XR) 25-1000 MG TB24   glipiZIDE (GLUCOTROL XL) 5 MG 24 hr tablet   Semaglutide, 1 MG/DOSE, (OZEMPIC, 1 MG/DOSE,) 4 MG/3ML SOPN   atorvastatin (LIPITOR) 10 MG tablet   escitalopram (LEXAPRO) 20 MG tablet   gabapentin (NEURONTIN) 600 MG tablet   lisinopril-hydrochlorothiazide (ZESTORETIC) 20-25 MG tablet   Alpha-Lipoic Acid 600 MG TABS   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)   Relevant Medications   Empagliflozin-metFORMIN HCl ER (SYNJARDY XR) 25-1000 MG TB24   glipiZIDE (GLUCOTROL XL) 5 MG 24 hr tablet   Semaglutide, 1 MG/DOSE, (OZEMPIC, 1 MG/DOSE,) 4 MG/3ML SOPN   atorvastatin (LIPITOR) 10 MG tablet   lisinopril-hydrochlorothiazide (ZESTORETIC) 20-25 MG tablet   Alpha-Lipoic Acid 600 MG TABS     Other   Fibromyalgia syndrome (Chronic)   Relevant Medications   escitalopram (LEXAPRO) 20 MG tablet   gabapentin (NEURONTIN) 600 MG tablet   Other Relevant Orders   CBC with Differential/Platelet   CMP14+EGFR   Lipid panel   VITAMIN D 25 Hydroxy (Vit-D Deficiency, Fractures)   Vitamin B12   Depression   Relevant Medications   ARIPiprazole (ABILIFY) 2 MG tablet   escitalopram (LEXAPRO) 20 MG tablet    Other Visit Diagnoses    Postmenopausal       Relevant Medications   conjugated estrogens (PREMARIN) vaginal cream      Continue current medication, with the neuropathy started, down the arm gabapentin, we said to consider Cymbalta but we have to try to help the Lexapro.  We will try alpha lipoic acid for her neuropathy disease, other studies in the 88 that showed it is a possibility to help.. Follow up plan: Return in about 3 months (around 09/24/2020), or if symptoms  worsen or fail to improve, for Diabetes and hypertension recheck.  Counseling provided for all of the vaccine components Orders Placed This Encounter  Procedures  . Bayer DCA Hb  A1c Waived  . CBC with Differential/Platelet  . CMP14+EGFR  . Lipid panel  . VITAMIN D 25 Hydroxy (Vit-D Deficiency, Fractures)  . Vitamin B12    Caryl Pina, MD [facility] 06/24/2020, 2:15 PM

## 2020-06-25 ENCOUNTER — Telehealth: Payer: Self-pay | Admitting: *Deleted

## 2020-06-25 LAB — CMP14+EGFR
ALT: 27 IU/L (ref 0–32)
AST: 27 IU/L (ref 0–40)
Albumin/Globulin Ratio: 1.8 (ref 1.2–2.2)
Albumin: 4.1 g/dL (ref 3.7–4.7)
Alkaline Phosphatase: 77 IU/L (ref 44–121)
BUN/Creatinine Ratio: 25 (ref 12–28)
BUN: 17 mg/dL (ref 8–27)
Bilirubin Total: 0.2 mg/dL (ref 0.0–1.2)
CO2: 23 mmol/L (ref 20–29)
Calcium: 9.2 mg/dL (ref 8.7–10.3)
Chloride: 98 mmol/L (ref 96–106)
Creatinine, Ser: 0.67 mg/dL (ref 0.57–1.00)
Globulin, Total: 2.3 g/dL (ref 1.5–4.5)
Glucose: 68 mg/dL (ref 65–99)
Potassium: 4.6 mmol/L (ref 3.5–5.2)
Sodium: 138 mmol/L (ref 134–144)
Total Protein: 6.4 g/dL (ref 6.0–8.5)
eGFR: 92 mL/min/{1.73_m2} (ref >59)

## 2020-06-25 LAB — CBC WITH DIFFERENTIAL/PLATELET
Basophils Absolute: 0.1 10*3/uL (ref 0.0–0.2)
Basos: 1 %
EOS (ABSOLUTE): 0.2 10*3/uL (ref 0.0–0.4)
Eos: 3 %
Hematocrit: 38.6 % (ref 34.0–46.6)
Hemoglobin: 12 g/dL (ref 11.1–15.9)
Immature Grans (Abs): 0 10*3/uL (ref 0.0–0.1)
Immature Granulocytes: 0 %
Lymphocytes Absolute: 3.2 10*3/uL — ABNORMAL HIGH (ref 0.7–3.1)
Lymphs: 36 %
MCH: 23.8 pg — ABNORMAL LOW (ref 26.6–33.0)
MCHC: 31.1 g/dL — ABNORMAL LOW (ref 31.5–35.7)
MCV: 77 fL — ABNORMAL LOW (ref 79–97)
Monocytes Absolute: 1 10*3/uL — ABNORMAL HIGH (ref 0.1–0.9)
Monocytes: 12 %
Neutrophils Absolute: 4.3 10*3/uL (ref 1.4–7.0)
Neutrophils: 48 %
Platelets: 309 10*3/uL (ref 150–450)
RBC: 5.04 x10E6/uL (ref 3.77–5.28)
RDW: 17.1 % — ABNORMAL HIGH (ref 11.7–15.4)
WBC: 8.8 10*3/uL (ref 3.4–10.8)

## 2020-06-25 LAB — LIPID PANEL
Chol/HDL Ratio: 3.8 ratio (ref 0.0–4.4)
Cholesterol, Total: 154 mg/dL (ref 100–199)
HDL: 41 mg/dL (ref >39)
LDL Chol Calc (NIH): 72 mg/dL (ref 0–99)
Triglycerides: 251 mg/dL — ABNORMAL HIGH (ref 0–149)
VLDL Cholesterol Cal: 41 mg/dL — ABNORMAL HIGH (ref 5–40)

## 2020-06-25 LAB — VITAMIN B12: Vitamin B-12: 412 pg/mL (ref 232–1245)

## 2020-06-25 LAB — VITAMIN D 25 HYDROXY (VIT D DEFICIENCY, FRACTURES): Vit D, 25-Hydroxy: 39.6 ng/mL (ref 30.0–100.0)

## 2020-06-25 NOTE — Telephone Encounter (Signed)
[employer] called and said that Aripipazole has been approved for a year. If you have any questions call [phone] and press option 5.

## 2020-06-25 NOTE — Telephone Encounter (Signed)
Approvedtoday Effective from 06/25/2020 through 06/25/2021

## 2020-06-25 NOTE — Telephone Encounter (Signed)
Pa in process for Abilify   Key: [id number] - Rx #: [id number]

## 2020-06-26 DIAGNOSIS — G894 Chronic pain syndrome: Secondary | ICD-10-CM | POA: Diagnosis not present

## 2020-07-01 NOTE — Progress Notes (Signed)
Pt r/c about labs 

## 2020-07-04 DIAGNOSIS — H5203 Hypermetropia, bilateral: Secondary | ICD-10-CM | POA: Diagnosis not present

## 2020-07-05 ENCOUNTER — Encounter: Payer: Self-pay | Admitting: Family Medicine

## 2020-07-05 DIAGNOSIS — H524 Presbyopia: Secondary | ICD-10-CM | POA: Diagnosis not present

## 2020-08-12 ENCOUNTER — Telehealth: Payer: Self-pay

## 2020-08-12 NOTE — Telephone Encounter (Signed)
Yes she is okay to stop her Plavix for 5 days prior to procedure and started the day after.

## 2020-08-12 NOTE — Telephone Encounter (Signed)
Pt called stating that she is getting ready to have a stimulator put in her spine and was told by her specialist to call her PCP and confirm with Dr Dettinger that it would be ok to stop taking her Plavix Rx for 5 days?  Please advise and call patient.

## 2020-08-12 NOTE — Telephone Encounter (Signed)
Pt has been informed.

## 2020-08-22 ENCOUNTER — Telehealth: Payer: Self-pay | Admitting: Family Medicine

## 2020-08-22 NOTE — Telephone Encounter (Signed)
I do think it would be helpful to get the second booster sometime before the fall

## 2020-08-22 NOTE — Telephone Encounter (Signed)
Pt made aware

## 2020-08-30 DIAGNOSIS — G894 Chronic pain syndrome: Secondary | ICD-10-CM | POA: Diagnosis not present

## 2020-08-30 DIAGNOSIS — M5416 Radiculopathy, lumbar region: Secondary | ICD-10-CM | POA: Diagnosis not present

## 2020-09-17 DIAGNOSIS — Z1231 Encounter for screening mammogram for malignant neoplasm of breast: Secondary | ICD-10-CM | POA: Diagnosis not present

## 2020-09-18 ENCOUNTER — Ambulatory Visit (INDEPENDENT_AMBULATORY_CARE_PROVIDER_SITE_OTHER): Payer: Medicare Other | Admitting: Family Medicine

## 2020-09-18 ENCOUNTER — Encounter: Payer: Self-pay | Admitting: Family Medicine

## 2020-09-18 DIAGNOSIS — R42 Dizziness and giddiness: Secondary | ICD-10-CM

## 2020-09-18 MED ORDER — MECLIZINE HCL 25 MG PO TABS: 25.0000 mg | ORAL_TABLET | Freq: Three times a day (TID) | ORAL | 1 refills | Status: DC | PRN

## 2020-09-18 NOTE — Progress Notes (Signed)
Virtual Visit via telephone Note  I connected with [name] on 09/18/20 at 1653 by telephone and verified that I am speaking with the correct person using two identifiers. [name] is currently located at home and patient are currently with her during visit. The provider, Fransisca Kaufmann Lorimer Tiberio, MD is located in their office at time of visit.  Call ended at 1658  I discussed the limitations, risks, security and privacy concerns of performing an evaluation and management service by telephone and the availability of in person appointments. I also discussed with the patient that there may be a patient responsible charge related to this service. The patient expressed understanding and agreed to proceed.   History and Present Illness: Patient is calling in for dizzy spells and nausea.  She has not been vomiting.  She has this since yesterday.  She feels like she has the room spinning.  She had this once before. She has ringing in her ears. She is having this when she turns or when she moves or when she tries to walk and having it all the time since yesterday and it does not seem to be wanting to go away.  1. Vertigo     Outpatient Encounter Medications as of 09/18/2020  Medication Sig   meclizine (ANTIVERT) 25 MG tablet Take 1 tablet (25 mg total) by mouth 3 (three) times daily as needed for dizziness.   Alpha-Lipoic Acid 600 MG TABS Take 600 mg by mouth daily.   ALPRAZolam (XANAX) 0.5 MG tablet Take 1 tablet (0.5 mg total) by mouth as needed for anxiety. Take 1-2 tabs daily as needed   ARIPiprazole (ABILIFY) 2 MG tablet Take 1 tablet (2 mg total) by mouth daily.   atorvastatin (LIPITOR) 10 MG tablet Take 1 tablet (10 mg total) by mouth daily.   blood glucose meter kit and supplies KIT Dispense based on patient and insurance preference. Use up to two times daily as directed. (Dx: type 2 DM - E11.9)   Blood Glucose Monitoring Suppl (ONE TOUCH ULTRA 2) w/Device KIT CHECK BLOOD SUGAR UP TO  TWICE DAILY Dx E11.9   Cholecalciferol (VITAMIN D) 50 MCG (2000 UT) tablet Take 2,000 Units by mouth daily.    clopidogrel (PLAVIX) 75 MG tablet TAKE 1 TABLET DAILY   conjugated estrogens (PREMARIN) vaginal cream Place vaginally at bedtime.   Empagliflozin-metFORMIN HCl ER (SYNJARDY XR) 25-1000 MG TB24 Take 1 tablet by mouth daily.   escitalopram (LEXAPRO) 20 MG tablet Take 1 tablet (20 mg total) by mouth daily.   ezetimibe (ZETIA) 10 MG tablet TAKE 1 TABLET DAILY   gabapentin (NEURONTIN) 600 MG tablet Take 1 tablet (600 mg total) by mouth in the morning, at noon, in the evening, and at bedtime.   glipiZIDE (GLUCOTROL XL) 5 MG 24 hr tablet Take 1 tablet (5 mg total) by mouth daily with breakfast.   glucose blood (ONETOUCH ULTRA) test strip Check BS up to 2 times daily Dx 11.43   HYDROcodone-acetaminophen (NORCO/VICODIN) 5-325 MG tablet Take 1 tablet by mouth every 6 (six) hours as needed for moderate pain.   lisinopril-hydrochlorothiazide (ZESTORETIC) 20-25 MG tablet Take 1 tablet by mouth daily.   nicotine polacrilex (NICORETTE) 2 MG gum Take 2 mg by mouth daily.   Omega-3 Fatty Acids (FISH OIL) 1000 MG CAPS Take by mouth. Take 2 pills daily   OneTouch Delica Lancets 16X MISC CHECK BLOOD SUGAR UP TO TWICE DAILY Dx E11.9   pantoprazole (PROTONIX) 40 MG tablet Take  1 tablet (40 mg total) by mouth daily.   Semaglutide, 1 MG/DOSE, (OZEMPIC, 1 MG/DOSE,) 4 MG/3ML SOPN INJECT 1 MG ONCE A WEEK AS DIRECTED   No facility-administered encounter medications on file as of 09/18/2020.    Review of Systems  HENT:  Positive for tinnitus. Negative for congestion, ear discharge and ear pain.   Eyes:  Negative for redness and visual disturbance.  Respiratory:  Negative for chest tightness and shortness of breath.   Cardiovascular:  Negative for chest pain and leg swelling.  Genitourinary:  Negative for difficulty urinating and dysuria.  Skin:  Negative for rash.  Neurological:  Positive for dizziness.  Negative for light-headedness and headaches.  Psychiatric/Behavioral:  Negative for agitation and behavioral problems.   All other systems reviewed and are negative.  Observations/Objective: Patient is comfortable and in no acute distress  Assessment and Plan: Problem List Items Addressed This Visit   None Visit Diagnoses     Vertigo    -  Primary   Relevant Medications   meclizine (ANTIVERT) 25 MG tablet       Gave handout for vertigo that she can pick up tomorrow and also sent medicine for vertigo for her. Follow up plan: Return if symptoms worsen or fail to improve.     I discussed the assessment and treatment plan with the patient. The patient was provided an opportunity to ask questions and all were answered. The patient agreed with the plan and demonstrated an understanding of the instructions.   The patient was advised to call back or seek an in-person evaluation if the symptoms worsen or if the condition fails to improve as anticipated.  The above assessment and management plan was discussed with the patient. The patient verbalized understanding of and has agreed to the management plan. Patient is aware to call the clinic if symptoms persist or worsen. Patient is aware when to return to the clinic for a follow-up visit. Patient educated on when it is appropriate to go to the emergency department.    I provided 5 minutes of non-face-to-face time during this encounter.    Worthy Rancher, MD

## 2020-09-25 ENCOUNTER — Other Ambulatory Visit: Payer: Self-pay

## 2020-09-25 ENCOUNTER — Encounter: Payer: Self-pay | Admitting: Family Medicine

## 2020-09-25 ENCOUNTER — Ambulatory Visit (INDEPENDENT_AMBULATORY_CARE_PROVIDER_SITE_OTHER): Payer: Medicare Other | Admitting: Family Medicine

## 2020-09-25 VITALS — BP 121/72 | HR 91 | Ht 66.0 in | Wt 205.0 lb

## 2020-09-25 DIAGNOSIS — E1142 Type 2 diabetes mellitus with diabetic polyneuropathy: Secondary | ICD-10-CM | POA: Diagnosis not present

## 2020-09-25 DIAGNOSIS — E11610 Type 2 diabetes mellitus with diabetic neuropathic arthropathy: Secondary | ICD-10-CM | POA: Diagnosis not present

## 2020-09-25 DIAGNOSIS — R8290 Unspecified abnormal findings in urine: Secondary | ICD-10-CM

## 2020-09-25 DIAGNOSIS — I10 Essential (primary) hypertension: Secondary | ICD-10-CM

## 2020-09-25 DIAGNOSIS — Z79899 Other long term (current) drug therapy: Secondary | ICD-10-CM

## 2020-09-25 DIAGNOSIS — E1143 Type 2 diabetes mellitus with diabetic autonomic (poly)neuropathy: Secondary | ICD-10-CM

## 2020-09-25 DIAGNOSIS — F320 Major depressive disorder, single episode, mild: Secondary | ICD-10-CM

## 2020-09-25 DIAGNOSIS — J683 Other acute and subacute respiratory conditions due to chemicals, gases, fumes and vapors: Secondary | ICD-10-CM

## 2020-09-25 LAB — URINALYSIS
Bilirubin, UA: NEGATIVE
Ketones, UA: NEGATIVE
Leukocytes,UA: NEGATIVE
Nitrite, UA: NEGATIVE
Protein,UA: NEGATIVE
RBC, UA: NEGATIVE
Specific Gravity, UA: 1.01 (ref 1.005–1.030)
Urobilinogen, Ur: 0.2 mg/dL (ref 0.2–1.0)
pH, UA: 6.5 (ref 5.0–7.5)

## 2020-09-25 LAB — BAYER DCA HB A1C WAIVED: HB A1C (BAYER DCA - WAIVED): 6.6 % (ref <7.0)

## 2020-09-25 MED ORDER — ALPRAZOLAM 0.5 MG PO TABS: 0.5000 mg | ORAL_TABLET | ORAL | 2 refills | Status: DC | PRN

## 2020-09-25 NOTE — Progress Notes (Signed)
BP 121/72   Pulse 91   Ht 5' 6"  (1.676 m)   Wt 205 lb (93 kg)   SpO2 97%   BMI 33.09 kg/m    Subjective:   [name], female    DOB: [birth year], 75 y.o.   MRN: [id number]  HPI: [name] is a 75 y.o. female presenting on 09/25/2020 for Medical Management of Chronic Issues and Bronchitis   HPI Type 2 diabetes mellitus Patient comes in today for recheck of his diabetes. Patient has been currently taking glipizide and Ozempic and Synjardy. Patient is currently on an ACE inhibitor/ARB. Patient has seen an ophthalmologist this year. Patient denies any issues with their feet. The symptom started onset as an adult hypertension and neuropathy ARE RELATED TO DM   Hypertension Patient is currently on lisinopril hydrochlorothiazide, and their blood pressure today is 121/72. Patient denies any lightheadedness or dizziness. Patient denies headaches, blurred vision, chest pains, shortness of breath, or weakness. Denies any side effects from medication and is content with current medication.   Patient feels like she has increased urinary odor and would like to leave urine sample to be tested.  She denies any burning or pain but just the odor and wants to be tested.  Patient says that she has been having more exertional dyspnea over the past couple months.  She says she specifically noticed it she was going into the grocery store and trying to walk around there and she started feeling short of breath and winded and had to sit down for quite a few minutes till it passed.  She says she feels tightness when she breathes and wheezing.  She does have a history of smoking in the past and quit about 12 years ago but smoked for at least 15 years 1 pack a day.  Anxiety and depression recheck Patient is coming in for anxiety and depression recheck.  She uses the alprazolam sometimes.  She seems to be doing well on her medicines as well as the Abilify.  She denies any suicidal ideations or  thoughts of herself.  She is also on Lexapro.  She is content with the medications they work well for her.  Will refill alprazolam although she does not use all the time so that she does have refills.  Relevant past medical, surgical, family and social history reviewed and updated as indicated. Interim medical history since our last visit reviewed. Allergies and medications reviewed and updated.  Review of Systems  Constitutional:  Negative for chills and fever.  Eyes:  Negative for visual disturbance.  Respiratory:  Positive for shortness of breath and wheezing. Negative for cough and chest tightness.   Cardiovascular:  Negative for chest pain and leg swelling.  Genitourinary:  Negative for difficulty urinating, dysuria, flank pain, frequency, hematuria and urgency.  Musculoskeletal:  Negative for back pain and gait problem.  Skin:  Negative for rash.  Neurological:  Negative for light-headedness and headaches.  Psychiatric/Behavioral:  Negative for agitation, behavioral problems, self-injury, sleep disturbance and suicidal ideas. The patient is nervous/anxious.   All other systems reviewed and are negative.  Per HPI unless specifically indicated above   Allergies as of 09/25/2020       Reactions   Penicillins Other (See Comments)   Blisters in mouth Did it involve swelling of the face/tongue/throat, SOB, or low BP? No Did it involve sudden or severe rash/hives, skin peeling, or any reaction on the inside of your mouth or nose? No Did  you need to seek medical attention at a hospital or doctor's office? No When did it last happen?      Childhood allergy If all above answers are "NO", may proceed with cephalosporin use.   Codeine Nausea And Vomiting   Erythromycin Nausea And Vomiting   Fenofibrate Other (See Comments)   aching & edema    Lyrica [pregabalin] Other (See Comments)   Edema in feet   Statins Other (See Comments)   Joints ache   Sulfa Antibiotics Nausea And Vomiting         Medication List        Accurate as of September 25, 2020 11:37 AM. If you have any questions, ask your nurse or doctor.          Alpha-Lipoic Acid 600 MG Tabs Take 600 mg by mouth daily.   ALPRAZolam 0.5 MG tablet Commonly known as: XANAX Take 1 tablet (0.5 mg total) by mouth as needed for anxiety. Take 1-2 tabs daily as needed   ARIPiprazole 2 MG tablet Commonly known as: ABILIFY Take 1 tablet (2 mg total) by mouth daily.   atorvastatin 10 MG tablet Commonly known as: LIPITOR Take 1 tablet (10 mg total) by mouth daily.   blood glucose meter kit and supplies Kit Dispense based on patient and insurance preference. Use up to two times daily as directed. (Dx: type 2 DM - E11.9)   clopidogrel 75 MG tablet Commonly known as: PLAVIX TAKE 1 TABLET DAILY   escitalopram 20 MG tablet Commonly known as: LEXAPRO Take 1 tablet (20 mg total) by mouth daily.   ezetimibe 10 MG tablet Commonly known as: ZETIA TAKE 1 TABLET DAILY   Fish Oil 1000 MG Caps Take by mouth. Take 2 pills daily   gabapentin 600 MG tablet Commonly known as: NEURONTIN Take 1 tablet (600 mg total) by mouth in the morning, at noon, in the evening, and at bedtime.   glipiZIDE 5 MG 24 hr tablet Commonly known as: GLUCOTROL XL Take 1 tablet (5 mg total) by mouth daily with breakfast.   HYDROcodone-acetaminophen 5-325 MG tablet Commonly known as: NORCO/VICODIN Take 1 tablet by mouth every 6 (six) hours as needed for moderate pain.   lisinopril-hydrochlorothiazide 20-25 MG tablet Commonly known as: ZESTORETIC Take 1 tablet by mouth daily.   meclizine 25 MG tablet Commonly known as: ANTIVERT Take 1 tablet (25 mg total) by mouth 3 (three) times daily as needed for dizziness.   nicotine polacrilex 2 MG gum Commonly known as: NICORETTE Take 2 mg by mouth daily.   ONE TOUCH ULTRA 2 w/Device Kit CHECK BLOOD SUGAR UP TO TWICE DAILY Dx S96.2   OneTouch Delica Lancets 83M Misc CHECK BLOOD SUGAR UP  TO TWICE DAILY Dx E11.9   OneTouch Ultra test strip Generic drug: glucose blood Check BS up to 2 times daily Dx 11.43   Ozempic (1 MG/DOSE) 4 MG/3ML Sopn Generic drug: Semaglutide (1 MG/DOSE) INJECT 1 MG ONCE A WEEK AS DIRECTED   pantoprazole 40 MG tablet Commonly known as: PROTONIX Take 1 tablet (40 mg total) by mouth daily.   Premarin vaginal cream Generic drug: conjugated estrogens Place vaginally at bedtime.   Synjardy XR 25-1000 MG Tb24 Generic drug: Empagliflozin-metFORMIN HCl ER Take 1 tablet by mouth daily.   Vitamin D 50 MCG (2000 UT) tablet Take 2,000 Units by mouth daily.         Objective:   BP 121/72   Pulse 91   Ht 5' 6"  (1.676 m)  Wt 205 lb (93 kg)   SpO2 97%   BMI 33.09 kg/m   Wt Readings from Last 3 Encounters:  09/25/20 205 lb (93 kg)  06/24/20 207 lb (93.9 kg)  05/31/20 206 lb (93.4 kg)    Physical Exam Vitals and nursing note reviewed.  Constitutional:      General: She is not in acute distress.    Appearance: She is well-developed. She is not diaphoretic.  Eyes:     Conjunctiva/sclera: Conjunctivae normal.  Cardiovascular:     Rate and Rhythm: Normal rate and regular rhythm.     Heart sounds: Normal heart sounds. No murmur heard. Pulmonary:     Effort: Pulmonary effort is normal. No respiratory distress.     Breath sounds: Normal breath sounds. No wheezing, rhonchi or rales.  Chest:     Chest wall: No tenderness.  Musculoskeletal:        General: No swelling or tenderness. Normal range of motion.  Skin:    General: Skin is warm and dry.     Findings: No rash.  Neurological:     Mental Status: She is alert and oriented to person, place, and time.     Coordination: Coordination normal.  Psychiatric:        Behavior: Behavior normal.      Assessment & Plan:   Problem List Items Addressed This Visit       Cardiovascular and Mediastinum   Essential hypertension, benign (Chronic)     Endocrine   Diabetes ([place]) - Primary  (Chronic)   Relevant Orders   Bayer DCA Hb A1c Waived (Completed)   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HCC)   Relevant Medications   ALPRAZolam (XANAX) 0.5 MG tablet   Diabetic autonomic neuropathy associated with type 2 diabetes mellitus ([place])     Other   Depression   Relevant Medications   ALPRAZolam (XANAX) 0.5 MG tablet   Other Visit Diagnoses     Abnormal urine odor       Relevant Orders   Urine Culture   Urinalysis   Controlled substance agreement signed       Relevant Medications   ALPRAZolam (XANAX) 0.5 MG tablet   Reactive airways dysfunction syndrome ([place])           Patient has a history of smoking almost like a reactive airways.  Gave him a sample of Trelegy that she can try once a day over the next 2 weeks and see how it does for her.  Refill current medication, urinalysis is pending and will watch for. Follow up plan: Return in about 3 months (around 12/26/2020), or if symptoms worsen or fail to improve, for Diabetes and hypertension and anxiety and depression.  Counseling provided for all of the vaccine components Orders Placed This Encounter  Procedures   Urine Culture   Bayer [facility] Hb A1c Waived   Urinalysis    Caryl Pina, MD [facility] 09/25/2020, 11:37 AM

## 2020-09-27 LAB — URINE CULTURE

## 2020-09-28 DIAGNOSIS — M545 Low back pain, unspecified: Secondary | ICD-10-CM | POA: Diagnosis not present

## 2020-10-04 DIAGNOSIS — M545 Low back pain, unspecified: Secondary | ICD-10-CM | POA: Diagnosis not present

## 2020-10-22 ENCOUNTER — Ambulatory Visit: Payer: Medicare Other | Admitting: Family Medicine

## 2020-10-24 ENCOUNTER — Encounter: Payer: Self-pay | Admitting: Family Medicine

## 2020-10-24 ENCOUNTER — Telehealth: Payer: Self-pay | Admitting: Family Medicine

## 2020-10-25 NOTE — Telephone Encounter (Signed)
Pt r/c.

## 2020-10-25 NOTE — Telephone Encounter (Signed)
Appointment scheduled 11/21/20 at 10:55 am with Dr. Warrick Parisian

## 2020-10-28 ENCOUNTER — Other Ambulatory Visit: Payer: Self-pay | Admitting: Family Medicine

## 2020-10-28 DIAGNOSIS — E11610 Type 2 diabetes mellitus with diabetic neuropathic arthropathy: Secondary | ICD-10-CM

## 2020-10-28 DIAGNOSIS — E1143 Type 2 diabetes mellitus with diabetic autonomic (poly)neuropathy: Secondary | ICD-10-CM

## 2020-11-14 ENCOUNTER — Other Ambulatory Visit: Payer: Self-pay | Admitting: Family Medicine

## 2020-11-14 DIAGNOSIS — I10 Essential (primary) hypertension: Secondary | ICD-10-CM

## 2020-11-20 ENCOUNTER — Other Ambulatory Visit: Payer: Self-pay | Admitting: Family Medicine

## 2020-11-20 DIAGNOSIS — E1143 Type 2 diabetes mellitus with diabetic autonomic (poly)neuropathy: Secondary | ICD-10-CM

## 2020-11-20 DIAGNOSIS — E11610 Type 2 diabetes mellitus with diabetic neuropathic arthropathy: Secondary | ICD-10-CM

## 2020-11-21 ENCOUNTER — Other Ambulatory Visit: Payer: Self-pay

## 2020-11-21 ENCOUNTER — Encounter: Payer: Self-pay | Admitting: Family Medicine

## 2020-11-21 ENCOUNTER — Ambulatory Visit (INDEPENDENT_AMBULATORY_CARE_PROVIDER_SITE_OTHER): Payer: Medicare Other | Admitting: Family Medicine

## 2020-11-21 VITALS — BP 115/68 | HR 91 | Ht 66.0 in | Wt 204.0 lb

## 2020-11-21 DIAGNOSIS — M5137 Other intervertebral disc degeneration, lumbosacral region: Secondary | ICD-10-CM

## 2020-11-21 DIAGNOSIS — Z01818 Encounter for other preprocedural examination: Secondary | ICD-10-CM | POA: Diagnosis not present

## 2020-11-21 DIAGNOSIS — R5383 Other fatigue: Secondary | ICD-10-CM

## 2020-11-21 LAB — COAGUCHEK XS/INR WAIVED
INR: 0.9 (ref 0.9–1.1)
Prothrombin Time: 11.1 s

## 2020-11-21 NOTE — Progress Notes (Signed)
BP 115/68   Pulse 91   Ht _0  (1.676 m)   Wt 204 lb (92.5 kg)   SpO2 95%   BMI 32.93 kg/m    Subjective:   [name], female    DOB: [birth year], 75 y.o.   MRN: [id number]  HPI: [name] is a 75 y.o. female presenting on 11/21/2020 for Surgical Clearance (Stimulator for back. EKG needed today) and Fatigue (Staying extremely tired)   HPI Patient is coming in today for preoperative surgical clearance for a back stimulator.  She is planning to have back stimulator done by Dr. Aletha Halim.  Patient says she has fatigue but that is chronic and she has been dealing with it for some time.  She denies any shortness of breath or chest pain.  She can walk distances without getting short of breath including up stairs.  She does have a lot of pain in her back and down through her legs which is why she is getting a stimulator and that limits her walking some when she has pain but not from shortness of breath or chest pain.  Relevant past medical, surgical, family and social history reviewed and updated as indicated. Interim medical history since our last visit reviewed. Allergies and medications reviewed and updated.  Review of Systems  Constitutional:  Negative for chills and fever.  HENT:  Negative for congestion, ear discharge and ear pain.   Eyes:  Negative for redness and visual disturbance.  Respiratory:  Negative for chest tightness and shortness of breath.   Cardiovascular:  Negative for chest pain and leg swelling.  Genitourinary:  Negative for difficulty urinating and dysuria.  Musculoskeletal:  Positive for arthralgias, back pain, myalgias and neck pain. Negative for gait problem.  Skin:  Negative for rash.  Neurological:  Negative for light-headedness and headaches.  Psychiatric/Behavioral:  Negative for agitation and behavioral problems.   All other systems reviewed and are negative.  Per HPI unless specifically indicated above   Allergies as of 11/21/2020        Reactions   Penicillins Other (See Comments)   Blisters in mouth Did it involve swelling of the face/tongue/throat, SOB, or low BP? No Did it involve sudden or severe rash/hives, skin peeling, or any reaction on the inside of your mouth or nose? No Did you need to seek medical attention at a hospital or doctor's office? No When did it last happen?      Childhood allergy If all above answers are "NO", may proceed with cephalosporin use.   Codeine Nausea And Vomiting   Erythromycin Nausea And Vomiting   Fenofibrate Other (See Comments)   aching & edema    Lyrica [pregabalin] Other (See Comments)   Edema in feet   Statins Other (See Comments)   Joints ache   Sulfa Antibiotics Nausea And Vomiting        Medication List        Accurate as of November 21, 2020 11:24 AM. If you have any questions, ask your nurse or doctor.          Alpha-Lipoic Acid 600 MG Tabs Take 600 mg by mouth daily.   ALPRAZolam 0.5 MG tablet Commonly known as: XANAX Take 1 tablet (0.5 mg total) by mouth as needed for anxiety. Take 1-2 tabs daily as needed   ARIPiprazole 2 MG tablet Commonly known as: ABILIFY Take 1 tablet (2 mg total) by mouth daily.   atorvastatin 10 MG tablet Commonly known as: LIPITOR  Take 1 tablet (10 mg total) by mouth daily.   blood glucose meter kit and supplies Kit Dispense based on patient and insurance preference. Use up to two times daily as directed. (Dx: type 2 DM - E11.9)   clopidogrel 75 MG tablet Commonly known as: PLAVIX TAKE 1 TABLET DAILY   escitalopram 20 MG tablet Commonly known as: LEXAPRO Take 1 tablet (20 mg total) by mouth daily.   ezetimibe 10 MG tablet Commonly known as: ZETIA TAKE 1 TABLET DAILY   Fish Oil 1000 MG Caps Take by mouth. Take 2 pills daily   gabapentin 600 MG tablet Commonly known as: NEURONTIN Take 1 tablet (600 mg total) by mouth in the morning, at noon, in the evening, and at bedtime.   glipiZIDE 5 MG 24 hr tablet Commonly  known as: GLUCOTROL XL Take 1 tablet (5 mg total) by mouth daily with breakfast.   HYDROcodone-acetaminophen 5-325 MG tablet Commonly known as: NORCO/VICODIN Take 1 tablet by mouth every 6 (six) hours as needed for moderate pain.   lisinopril-hydrochlorothiazide 20-25 MG tablet Commonly known as: ZESTORETIC Take 1 tablet by mouth daily.   meclizine 25 MG tablet Commonly known as: ANTIVERT Take 1 tablet (25 mg total) by mouth 3 (three) times daily as needed for dizziness.   nicotine polacrilex 2 MG gum Commonly known as: NICORETTE Take 2 mg by mouth daily.   ONE TOUCH ULTRA 2 w/Device Kit CHECK BLOOD SUGAR UP TO TWICE DAILY Dx B71.6   OneTouch Delica Lancets 96V Misc CHECK BLOOD SUGAR UP TO TWICE DAILY Dx E11.9   OneTouch Ultra test strip Generic drug: glucose blood Check BS up to 2 times daily Dx 11.43   Ozempic (1 MG/DOSE) 4 MG/3ML Sopn Generic drug: Semaglutide (1 MG/DOSE) INJECT 1 MG ONCE A WEEK AS DIRECTED   pantoprazole 40 MG tablet Commonly known as: PROTONIX Take 1 tablet (40 mg total) by mouth daily.   Premarin vaginal cream Generic drug: conjugated estrogens Place vaginally at bedtime.   Synjardy XR 25-1000 MG Tb24 Generic drug: Empagliflozin-metFORMIN HCl ER Take 1 tablet by mouth daily.   Vitamin D 50 MCG (2000 UT) tablet Take 2,000 Units by mouth daily.         Objective:   BP 115/68   Pulse 91   Ht _0  (1.676 m)   Wt 204 lb (92.5 kg)   SpO2 95%   BMI 32.93 kg/m   Wt Readings from Last 3 Encounters:  11/21/20 204 lb (92.5 kg)  09/25/20 205 lb (93 kg)  06/24/20 207 lb (93.9 kg)    Physical Exam Vitals and nursing note reviewed.  Constitutional:      General: She is not in acute distress.    Appearance: She is well-developed. She is not diaphoretic.  Eyes:     Conjunctiva/sclera: Conjunctivae normal.  Cardiovascular:     Rate and Rhythm: Normal rate and regular rhythm.     Heart sounds: Normal heart sounds. No murmur  heard. Pulmonary:     Effort: Pulmonary effort is normal. No respiratory distress.     Breath sounds: Normal breath sounds. No wheezing.  Skin:    General: Skin is warm and dry.     Findings: No rash.  Neurological:     Mental Status: She is alert and oriented to person, place, and time.     Coordination: Coordination normal.  Psychiatric:        Behavior: Behavior normal.    EKG: nsr, hr 81  Assessment &  Plan:   Problem List Items Addressed This Visit       Musculoskeletal and Integument   DDD (degenerative disc disease), lumbosacral (Chronic)   Other Visit Diagnoses     Preoperative clearance    -  Primary   Relevant Orders   EKG 12-Lead (Completed)   CBC with Differential/Platelet   CMP14+EGFR   TSH   CoaguChek XS/INR Waived   Other fatigue       Relevant Orders   CBC with Differential/Platelet   CMP14+EGFR   TSH   CoaguChek XS/INR Waived   Vitamin B12       As long as blood work comes back good she will be cleared for surgery. Follow up plan: Return in about 5 weeks (around 12/26/2020), or if symptoms worsen or fail to improve, for anxiety.  Counseling provided for all of the vaccine components Orders Placed This Encounter  Procedures   CBC with Differential/Platelet   CMP14+EGFR   TSH   CoaguChek XS/INR Waived   Vitamin B12   EKG 12-Lead    Caryl Pina, MD [facility] 11/21/2020, 11:24 AM

## 2020-11-22 LAB — TSH: TSH: 1.75 u[IU]/mL (ref 0.450–4.500)

## 2020-11-22 LAB — CBC WITH DIFFERENTIAL/PLATELET
Basophils Absolute: 0.1 10*3/uL (ref 0.0–0.2)
Basos: 1 %
EOS (ABSOLUTE): 0.2 10*3/uL (ref 0.0–0.4)
Eos: 4 %
Hematocrit: 39.2 % (ref 34.0–46.6)
Hemoglobin: 12.1 g/dL (ref 11.1–15.9)
Immature Grans (Abs): 0 10*3/uL (ref 0.0–0.1)
Immature Granulocytes: 0 %
Lymphocytes Absolute: 2 10*3/uL (ref 0.7–3.1)
Lymphs: 31 %
MCH: 24.3 pg — ABNORMAL LOW (ref 26.6–33.0)
MCHC: 30.9 g/dL — ABNORMAL LOW (ref 31.5–35.7)
MCV: 79 fL (ref 79–97)
Monocytes Absolute: 0.6 10*3/uL (ref 0.1–0.9)
Monocytes: 10 %
Neutrophils Absolute: 3.5 10*3/uL (ref 1.4–7.0)
Neutrophils: 54 %
Platelets: 295 10*3/uL (ref 150–450)
RBC: 4.98 x10E6/uL (ref 3.77–5.28)
RDW: 15.6 % — ABNORMAL HIGH (ref 11.7–15.4)
WBC: 6.4 10*3/uL (ref 3.4–10.8)

## 2020-11-22 LAB — CMP14+EGFR
ALT: 21 IU/L (ref 0–32)
AST: 18 IU/L (ref 0–40)
Albumin/Globulin Ratio: 1.5 (ref 1.2–2.2)
Albumin: 3.9 g/dL (ref 3.7–4.7)
Alkaline Phosphatase: 73 IU/L (ref 44–121)
BUN/Creatinine Ratio: 17 (ref 12–28)
BUN: 13 mg/dL (ref 8–27)
Bilirubin Total: 0.3 mg/dL (ref 0.0–1.2)
CO2: 21 mmol/L (ref 20–29)
Calcium: 9.1 mg/dL (ref 8.7–10.3)
Chloride: 96 mmol/L (ref 96–106)
Creatinine, Ser: 0.77 mg/dL (ref 0.57–1.00)
Globulin, Total: 2.6 g/dL (ref 1.5–4.5)
Glucose: 216 mg/dL — ABNORMAL HIGH (ref 65–99)
Potassium: 4.9 mmol/L (ref 3.5–5.2)
Sodium: 134 mmol/L (ref 134–144)
Total Protein: 6.5 g/dL (ref 6.0–8.5)
eGFR: 80 mL/min/{1.73_m2} (ref >59)

## 2020-11-22 LAB — VITAMIN B12: Vitamin B-12: 354 pg/mL (ref 232–1245)

## 2020-11-25 ENCOUNTER — Telehealth: Payer: Self-pay

## 2020-11-25 NOTE — Telephone Encounter (Signed)
Clearance for surgery faxed back to [facility] at [phone]

## 2020-11-29 ENCOUNTER — Ambulatory Visit: Payer: Self-pay | Admitting: Orthopedic Surgery

## 2020-12-02 ENCOUNTER — Other Ambulatory Visit: Payer: Self-pay | Admitting: Family Medicine

## 2020-12-02 DIAGNOSIS — F320 Major depressive disorder, single episode, mild: Secondary | ICD-10-CM

## 2020-12-02 DIAGNOSIS — Z79899 Other long term (current) drug therapy: Secondary | ICD-10-CM

## 2020-12-03 ENCOUNTER — Other Ambulatory Visit: Payer: Self-pay | Admitting: Family Medicine

## 2020-12-03 DIAGNOSIS — F320 Major depressive disorder, single episode, mild: Secondary | ICD-10-CM

## 2020-12-03 DIAGNOSIS — Z79899 Other long term (current) drug therapy: Secondary | ICD-10-CM

## 2020-12-16 ENCOUNTER — Other Ambulatory Visit: Payer: Self-pay | Admitting: Family Medicine

## 2020-12-16 DIAGNOSIS — E11610 Type 2 diabetes mellitus with diabetic neuropathic arthropathy: Secondary | ICD-10-CM

## 2020-12-16 DIAGNOSIS — E1143 Type 2 diabetes mellitus with diabetic autonomic (poly)neuropathy: Secondary | ICD-10-CM

## 2020-12-20 ENCOUNTER — Ambulatory Visit: Payer: Self-pay | Admitting: Orthopedic Surgery

## 2020-12-20 NOTE — H&P (Signed)
Subjective:   Sabryn is a pleasant 75 year old female with chronic low back pain is that significant results with a spinal cord stimulator trial. He has expressed a desire to move forward with permanent implantation of a spinal cord stimulator.  Patient Active Problem List   Diagnosis Date Noted   Paresthesia 06/14/2019   Diabetic autonomic neuropathy associated with type 2 diabetes mellitus ([place]) 06/14/2019   History of CVA (cerebrovascular accident) 11/03/2018   Cerebellar ataxia in diseases classified elsewhere ([place]) 11/03/2018   Failed total knee arthroplasty ([place]) 03/09/2018   Aortic atherosclerosis ([place]) 12/08/2017   Hardening of the aorta (main artery of the heart) ([place]) 12/08/2017   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[place]) 01/14/2015   Depression 02/28/2013   GERD (gastroesophageal reflux disease) 02/28/2013   Hiatal hernia with gastroesophageal reflux 02/28/2013   DDD (degenerative disc disease), lumbosacral 06/23/2012   Fibromyalgia syndrome 06/23/2012   Essential hypertension, benign 06/23/2012   Osteopenia 06/23/2012   Diabetes ([place]) 06/23/2012   Abnormal EKG 10/29/2010   Obesity due to excess calories 10/29/2010   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   DYSPHAGIA 10/11/2009   PERSONAL HISTORY OF COLONIC POLYPS 10/11/2009   Past Medical History:  Diagnosis Date   Allergy    seasonal   Arthritis    [place]    Colon polyps    Complication of anesthesia    per pt, she woke up in the middle of last colon in 2014.   Depression    Diabetes mellitus, type 2 ([place]) 06/2010   Diabetic neuropathy ([place])    Diverticulosis    pt unaware   Esophageal stricture    Fibromyalgia    [place]    GERD (gastroesophageal reflux disease)    Hemorrhoids    Hiatal hernia    Hyperlipidemia    Hypertension    Menopause    Numbness    Peripheral vascular insufficiency ([facility])    Retinopathy    Bilateral   TIA (transient ischemic attack)    Tremor    Vitamin D  deficiency     Past Surgical History:  Procedure Laterality Date   ABDOMINAL HYSTERECTOMY     partial and then total   ANKLE SURGERY Right    COLONOSCOPY     ELBOW SURGERY     left   KNEE ARTHROSCOPY Right 09/12/2018   Procedure: ARTHROSCOPY KNEE; SYNOVECTOMY;  Surgeon: Gaynelle Arabian, MD;  Location: [facility];  Service: Orthopedics;  Laterality: Right;  49mn   REFRACTIVE SURGERY  2003   TOTAL KNEE ARTHROPLASTY     bilateral   TOTAL KNEE REVISION Right 03/09/2018   Procedure: RIGHT TOTAL KNEE REVISION;  Surgeon: AGaynelle Arabian MD;  Location: [facility];  Service: Orthopedics;  Laterality: Right;  1225m with abductor block   VESICOVAGINAL FISTULA CLOSURE W/ TAH  1981    Current Outpatient Medications  Medication Sig Dispense Refill Last Dose   Alpha-Lipoic Acid 600 MG TABS Take 600 mg by mouth daily. 90 tablet 3    ALPRAZolam (XANAX) 0.5 MG tablet Take 1 tablet (0.5 mg total) by mouth as needed for anxiety. Take 1-2 tabs daily as needed 30 tablet 2    ARIPiprazole (ABILIFY) 2 MG tablet Take 1 tablet (2 mg total) by mouth daily. 90 tablet 3    atorvastatin (LIPITOR) 10 MG tablet Take 1 tablet (10 mg total) by mouth daily. 90 tablet 3    blood glucose meter kit and supplies KIT Dispense based on patient and insurance preference. Use up to two times daily  as directed. (Dx: type 2 DM - E11.9) 1 each 0    Blood Glucose Monitoring Suppl (ONE TOUCH ULTRA 2) w/Device KIT CHECK BLOOD SUGAR UP TO TWICE DAILY Dx E11.9 1 kit 0    Cholecalciferol (VITAMIN D) 50 MCG (2000 UT) tablet Take 2,000 Units by mouth daily.       clopidogrel (PLAVIX) 75 MG tablet TAKE 1 TABLET DAILY 90 tablet 0    conjugated estrogens (PREMARIN) vaginal cream Place vaginally at bedtime. 30 g 3    Empagliflozin-metFORMIN HCl ER (SYNJARDY XR) 25-1000 MG TB24 Take 1 tablet by mouth daily. 90 tablet 3    escitalopram (LEXAPRO) 20 MG tablet Take 1 tablet (20 mg total) by mouth daily. 90 tablet 3    ezetimibe (ZETIA) 10 MG tablet TAKE 1  TABLET DAILY 90 tablet 0    gabapentin (NEURONTIN) 600 MG tablet Take 1 tablet (600 mg total) by mouth in the morning, at noon, in the evening, and at bedtime. 360 tablet 3    glipiZIDE (GLUCOTROL XL) 5 MG 24 hr tablet Take 1 tablet (5 mg total) by mouth daily with breakfast. 90 tablet 3    glucose blood (ONETOUCH ULTRA) test strip Check BS up to 2 times daily Dx 11.43 200 strip 3    HYDROcodone-acetaminophen (NORCO/VICODIN) 5-325 MG tablet Take 1 tablet by mouth every 6 (six) hours as needed for moderate pain.      lisinopril-hydrochlorothiazide (ZESTORETIC) 20-25 MG tablet Take 1 tablet by mouth daily. 90 tablet 3    meclizine (ANTIVERT) 25 MG tablet Take 1 tablet (25 mg total) by mouth 3 (three) times daily as needed for dizziness. 30 tablet 1    nicotine polacrilex (NICORETTE) 2 MG gum Take 2 mg by mouth daily.      Omega-3 Fatty Acids (FISH OIL) 1000 MG CAPS Take by mouth. Take 2 pills daily      OneTouch Delica Lancets 87G MISC CHECK BLOOD SUGAR UP TO TWICE DAILY Dx E11.9 200 each 3    OZEMPIC, 1 MG/DOSE, 4 MG/3ML SOPN INJECT 1 MG ONCE A WEEK AS DIRECTED 3 mL 0    pantoprazole (PROTONIX) 40 MG tablet Take 1 tablet (40 mg total) by mouth daily. 90 tablet 3    No current facility-administered medications for this visit.   Allergies  Allergen Reactions   Penicillins Other (See Comments)    Blisters in mouth Did it involve swelling of the face/tongue/throat, SOB, or low BP? No Did it involve sudden or severe rash/hives, skin peeling, or any reaction on the inside of your mouth or nose? No Did you need to seek medical attention at a hospital or doctor's office? No When did it last happen?      Childhood allergy If all above answers are "NO", may proceed with cephalosporin use.    Codeine Nausea And Vomiting   Erythromycin Nausea And Vomiting   Fenofibrate Other (See Comments)    aching & edema     Lyrica [Pregabalin] Other (See Comments)    Edema in feet    Statins Other (See  Comments)    Joints ache   Sulfa Antibiotics Nausea And Vomiting    Social History   Tobacco Use   Smoking status: Former    Packs/day: 1.00    Years: 10.00    Pack years: 10.00    Types: Cigarettes    Start date: 11/07/1975    Quit date: 06/24/2010    Years since quitting: 10.4   Smokeless tobacco: Never  Substance Use Topics   Alcohol use: Not Currently    Family History  Problem Relation Age of Onset   Diabetes Mother    Heart failure Mother    Hypertension Mother    Cirrhosis Father    Cirrhosis Brother     Review of Systems Pertinent items are noted in HPI.  Objective:   Vitals: Ht: 5 ft 6 in 12/20/2020 02:27 pm Wt: 204.5 lbs 12/20/2020 02:28 pm BMI: 33 12/20/2020 02:28 pm BP: 138/88 12/20/2020 02:28 pm O2Sat: 98% 12/20/2020 02:28 pm  Clinical exam: Katriona is a pleasant individual, who appears younger than their stated age. She is alert and orientated 3. No shortness of breath, chest pain.  Heart: Regular rate and rhythm, no rubs, murmurs, or gallops  Lungs: Clear to auscultation bilaterally  Bowel sounds 4. Nondistended, nontender, no rebound tenderness. No loss of bladder or bowel control.  Abdomen is soft and non-tender, negative loss of bowel and bladder control, no rebound tenderness. Negative: skin lesions abrasions contusions Peripheral pulses: 2+ dorsalis pedis/posterior tibialis pulses bilaterally. Compartment soft and nontender.  Gait pattern: Patient ambulates in a forward flexed posture due to comfort. Assistive devices: None  Neuro: 5/5 motor strength in the lower extremity bilaterally. Patient does have gluteal and thigh dysesthesias with extension of the spine relief with forward flexion. Negative Babinski test, no clonus, negative straight leg raise test. 1+ deep tendon reflexes at the knee and absent at the Achilles  Musculoskeletal: Significant low back pain with palpation and range of motion. No SI joint pain. Bilateral total knee  replacements with a revision. Chronic bilateral anterior knee pain is noted.  X-rays of the lumbar spine demonstrate degenerative lumbar disc disease L4-S1 with the grade 1 degenerative slip at L4-5. No fracture is noted. Thoracic MRI: completed on 09/28/20 was reviewed with the patient. [email or web address] I have independently reviewed the images as well as the radiology report. Unremarkable MRI of the thoracic spine. Patient does have's significant degenerative cervical disease C5-7. No cord impingement or cord signal change. Minimal multilevel anterior wedging of the lower thoracic spine most prominent gait most likely degenerative in nature.  Assessment:    [name] is a very pleasant 75 year old he has had chronic low back pain as well as bilateral knee pain. Clinical exam is consistent with lumbar spinal stenosis with neurogenic claudication. Patient recently had a spinal cord stimulator trial and noted 85% improvement in pain in quality-of-life. She presents today for definitive implantation I have gone over the surgical procedure in great detail with her and all of her questions were addressed. Risks and benefits of surgery were discussed with the patient. These include: Infection, bleeding, death, stroke, paralysis, ongoing or worse pain, need for additional surgery, leak of spinal fluid, Failure of the battery requiring reoperation. Inability to place the paddle requiring the surgery to be aborted. Migration of the lead, failure to obtain results similar to the trial.  Plan:    Placement of spinal cord stimulator  We will obtain preoperative medical clearance from the patient's primary care provider.  Patient is on Plavix. She will hold this 7 days prior to procedure and we will plan to resume at 48 hours after. Patient is not using any over-the-counter or prescription NSAIDs or aspirin products. She will hold her vitamins and supplements 7 days prior to surgery as well.  We  have also discussed the post-operative recovery period to include: bathing/showering restrictions, wound healing, activity (and driving) restrictions, medications/pain mangement. Oxycodone makes her  nauseous. She tolerate hydrocodone better. She is currently on hydrocodone/acetaminophen 5/325 from Dr. Nelva Bush. We will plan to give her hydrocodone 10/325 for 5 days postop and then she may get Additional pain medications from Dr. Nelva Bush  We have also discussed post-operative redflags to include: signs and symptoms of postoperative infection, DVT/PE.  Patient given a copy of discharge instructions which were reviewed with her at today's office visit.  All questions invited and answered.

## 2020-12-20 NOTE — H&P (Deleted)
  The note originally documented on this encounter has been moved the the encounter in which it belongs.  

## 2020-12-27 NOTE — Pre-Procedure Instructions (Signed)
Surgical Instructions    Your procedure is scheduled on Wednesday, January 01, 2021 at 7:30 AM.  Report to [facility] Main Entrance "A" at 5:30 A.M., then check in with the Admitting office.  Call this number if you have problems the morning of surgery:  [phone]   If you have any questions prior to your surgery date call [phone]: Open Monday-Friday 8am-4pm    Remember:  Do not eat or drink after midnight the night before your surgery    Take these medicines the morning of surgery with A SIP OF WATER:  atorvastatin (LIPITOR)  escitalopram (LEXAPRO)  gabapentin (NEURONTIN) pantoprazole (PROTONIX)  IF NEEDED: ALPRAZolam (XANAX) HYDROcodone-acetaminophen (NORCO/VICODIN) meclizine (ANTIVERT)  Follow your surgeon's instructions on when to stop clopidogrel (PLAVIX).  If no instructions were given by your surgeon then you will need to call the office to get those instructions.     As of today, STOP taking any Aleve, Naproxen, Ibuprofen, Motrin, Advil, Goody's, BC's, all herbal medications, fish oil, and all vitamins.  WHAT DO I DO ABOUT MY DIABETES MEDICATION?   Do not take glipiZIDE (GLUCOTROL XL) the morning of surgery. Do not take Empagliflozin-metFORMIN HCl ER (SYNJARDY XR) the day before surgery or the morning of surgery.   HOW TO MANAGE YOUR DIABETES BEFORE AND AFTER SURGERY  Why is it important to control my blood sugar before and after surgery? Improving blood sugar levels before and after surgery helps healing and can limit problems. A way of improving blood sugar control is eating a healthy diet by:  Eating less sugar and carbohydrates  Increasing activity/exercise  Talking with your doctor about reaching your blood sugar goals High blood sugars (greater than 180 mg/dL) can raise your risk of infections and slow your recovery, so you will need to focus on controlling your diabetes during the weeks before surgery. Make sure that the doctor who takes care  of your diabetes knows about your planned surgery including the date and location.  How do I manage my blood sugar before surgery? Check your blood sugar at least 4 times a day, starting 2 days before surgery, to make sure that the level is not too high or low.  Check your blood sugar the morning of your surgery when you wake up and every 2 hours until you get to the Short Stay unit.  If your blood sugar is less than 70 mg/dL, you will need to treat for low blood sugar: Do not take insulin. Treat a low blood sugar (less than 70 mg/dL) with  cup of clear juice (cranberry or apple), 4 glucose tablets, OR glucose gel. Recheck blood sugar in 15 minutes after treatment (to make sure it is greater than 70 mg/dL). If your blood sugar is not greater than 70 mg/dL on recheck, call [phone] for further instructions. Report your blood sugar to the short stay nurse when you get to Short Stay.  If you are admitted to the hospital after surgery: Your blood sugar will be checked by the staff and you will probably be given insulin after surgery (instead of oral diabetes medicines) to make sure you have good blood sugar levels. The goal for blood sugar control after surgery is 80-180 mg/dL.                      Do NOT Smoke (Tobacco/Vaping) or drink Alcohol 24 hours prior to your procedure.  If you use a CPAP at night, you may bring all equipment for your  overnight stay.   Contacts, glasses, piercing's, hearing aid's, dentures or partials may not be worn into surgery, please bring cases for these belongings.    For patients admitted to the hospital, discharge time will be determined by your treatment team.   Patients discharged the day of surgery will not be allowed to drive home, and someone needs to stay with them for 24 hours.  ONLY 1 SUPPORT PERSON MAY BE PRESENT WHILE YOU ARE IN SURGERY. IF YOU ARE TO BE ADMITTED ONCE YOU ARE IN YOUR ROOM YOU WILL BE ALLOWED TWO (2) VISITORS.  Minor children  may have two parents present. Special consideration for safety and communication needs will be reviewed on a case by case basis.   Special instructions:   [place]- Preparing For Surgery  Before surgery, you can play an important role. Because skin is not sterile, your skin needs to be as free of germs as possible. You can reduce the number of germs on your skin by washing with CHG (chlorahexidine gluconate) Soap before surgery.  CHG is an antiseptic cleaner which kills germs and bonds with the skin to continue killing germs even after washing.    Oral Hygiene is also important to reduce your risk of infection.  Remember - BRUSH YOUR TEETH THE MORNING OF SURGERY WITH YOUR REGULAR TOOTHPASTE  Please do not use if you have an allergy to CHG or antibacterial soaps. If your skin becomes reddened/irritated stop using the CHG.  Do not shave (including legs and underarms) for at least 48 hours prior to first CHG shower. It is OK to shave your face.  Please follow these instructions carefully.   Shower the NIGHT BEFORE SURGERY and the MORNING OF SURGERY  If you chose to wash your hair, wash your hair first as usual with your normal shampoo.  After you shampoo, rinse your hair and body thoroughly to remove the shampoo.  Use CHG Soap as you would any other liquid soap. You can apply CHG directly to the skin and wash gently with a scrungie or a clean washcloth.   Apply the CHG Soap to your body ONLY FROM THE NECK DOWN.  Do not use on open wounds or open sores. Avoid contact with your eyes, ears, mouth and genitals (private parts). Wash Face and genitals (private parts)  with your normal soap.   Wash thoroughly, paying special attention to the area where your surgery will be performed.  Thoroughly rinse your body with warm water from the neck down.  DO NOT shower/wash with your normal soap after using and rinsing off the CHG Soap.  Pat yourself dry with a CLEAN TOWEL.  Wear CLEAN PAJAMAS to  bed the night before surgery  Place CLEAN SHEETS on your bed the night before your surgery  DO NOT SLEEP WITH PETS.   Day of Surgery: Shower with CHG soap. Do not wear jewelry, make up, nail polish, gel polish, artificial nails, or any other type of covering on natural nails including finger and toenails. If patients have artificial nails, gel coating, etc. that need to be removed by a nail salon please have this removed prior to surgery. Surgery may need to be canceled/delayed if the surgeon/ anesthesia feels like the patient is unable to be adequately monitored. Do not wear lotions, powders, perfumes, or deodorant. Do not shave 48 hours prior to surgery. Do not bring valuables to the hospital. [facility] is not responsible for any belongings or valuables. Wear Clean/Comfortable clothing the morning of  surgery Remember to brush your teeth WITH YOUR REGULAR TOOTHPASTE.   Please read over the following fact sheets that you were given.

## 2020-12-30 ENCOUNTER — Ambulatory Visit (INDEPENDENT_AMBULATORY_CARE_PROVIDER_SITE_OTHER): Payer: Medicare Other | Admitting: Family Medicine

## 2020-12-30 ENCOUNTER — Encounter (HOSPITAL_COMMUNITY): Payer: Self-pay

## 2020-12-30 ENCOUNTER — Encounter (HOSPITAL_COMMUNITY)
Admission: RE | Admit: 2020-12-30 | Discharge: 2020-12-30 | Disposition: A | Discharge status: Home / Self Care | Payer: Medicare Other | Source: Ambulatory Visit | Attending: Orthopedic Surgery | Admitting: Orthopedic Surgery

## 2020-12-30 ENCOUNTER — Other Ambulatory Visit: Payer: Self-pay

## 2020-12-30 ENCOUNTER — Encounter: Payer: Self-pay | Admitting: Family Medicine

## 2020-12-30 VITALS — BP 130/74 | HR 90 | Ht 66.0 in | Wt 204.0 lb

## 2020-12-30 DIAGNOSIS — Z79899 Other long term (current) drug therapy: Secondary | ICD-10-CM | POA: Diagnosis not present

## 2020-12-30 DIAGNOSIS — E1142 Type 2 diabetes mellitus with diabetic polyneuropathy: Secondary | ICD-10-CM | POA: Diagnosis not present

## 2020-12-30 DIAGNOSIS — Z01812 Encounter for preprocedural laboratory examination: Secondary | ICD-10-CM | POA: Diagnosis not present

## 2020-12-30 DIAGNOSIS — I10 Essential (primary) hypertension: Secondary | ICD-10-CM

## 2020-12-30 DIAGNOSIS — Z20822 Contact with and (suspected) exposure to covid-19: Secondary | ICD-10-CM | POA: Diagnosis not present

## 2020-12-30 DIAGNOSIS — F320 Major depressive disorder, single episode, mild: Secondary | ICD-10-CM

## 2020-12-30 DIAGNOSIS — E11610 Type 2 diabetes mellitus with diabetic neuropathic arthropathy: Secondary | ICD-10-CM

## 2020-12-30 DIAGNOSIS — R42 Dizziness and giddiness: Secondary | ICD-10-CM

## 2020-12-30 DIAGNOSIS — E1143 Type 2 diabetes mellitus with diabetic autonomic (poly)neuropathy: Secondary | ICD-10-CM

## 2020-12-30 LAB — BASIC METABOLIC PANEL
Anion gap: 12 (ref 5–15)
BUN: 10 mg/dL (ref 8–23)
CO2: 26 mmol/L (ref 22–32)
Calcium: 9.1 mg/dL (ref 8.9–10.3)
Chloride: 97 mmol/L — ABNORMAL LOW (ref 98–111)
Creatinine, Ser: 0.84 mg/dL (ref 0.44–1.00)
GFR, Estimated: >60 mL/min (ref >60)
Glucose, Bld: 106 mg/dL — ABNORMAL HIGH (ref 70–99)
Potassium: 3.9 mmol/L (ref 3.5–5.1)
Sodium: 135 mmol/L (ref 135–145)

## 2020-12-30 LAB — BAYER DCA HB A1C WAIVED: HB A1C (BAYER DCA - WAIVED): 6.6 % — ABNORMAL HIGH (ref 4.8–5.6)

## 2020-12-30 LAB — CBC
HCT: 39.5 % (ref 36.0–46.0)
Hemoglobin: 12.2 g/dL (ref 12.0–15.0)
MCH: 24.8 pg — ABNORMAL LOW (ref 26.0–34.0)
MCHC: 30.9 g/dL (ref 30.0–36.0)
MCV: 80.4 fL (ref 80.0–100.0)
Platelets: 348 10*3/uL (ref 150–400)
RBC: 4.91 MIL/uL (ref 3.87–5.11)
RDW: 16.6 % — ABNORMAL HIGH (ref 11.5–15.5)
WBC: 8.9 10*3/uL (ref 4.0–10.5)
nRBC: 0 % (ref 0.0–0.2)

## 2020-12-30 LAB — URINALYSIS, ROUTINE W REFLEX MICROSCOPIC
Bacteria, UA: NONE SEEN
Bilirubin Urine: NEGATIVE
Glucose, UA: >=500 mg/dL — AB
Hgb urine dipstick: NEGATIVE
Ketones, ur: NEGATIVE mg/dL
Leukocytes,Ua: NEGATIVE
Nitrite: NEGATIVE
Protein, ur: NEGATIVE mg/dL
Specific Gravity, Urine: 1.007 (ref 1.005–1.030)
pH: 7 (ref 5.0–8.0)

## 2020-12-30 LAB — GLUCOSE, CAPILLARY: Glucose-Capillary: 99 mg/dL (ref 70–99)

## 2020-12-30 LAB — SURGICAL PCR SCREEN
MRSA, PCR: NEGATIVE
Staphylococcus aureus: NEGATIVE

## 2020-12-30 LAB — SARS CORONAVIRUS 2 (TAT 6-24 HRS): SARS Coronavirus 2: NEGATIVE

## 2020-12-30 MED ORDER — PANTOPRAZOLE SODIUM 40 MG PO TBEC: 40.0000 mg | DELAYED_RELEASE_TABLET | Freq: Every day | ORAL | 3 refills | Status: DC

## 2020-12-30 MED ORDER — ALPRAZOLAM 0.5 MG PO TABS: 0.5000 mg | ORAL_TABLET | Freq: Every day | ORAL | 2 refills | Status: DC | PRN

## 2020-12-30 MED ORDER — CLOPIDOGREL BISULFATE 75 MG PO TABS: 75.0000 mg | ORAL_TABLET | Freq: Every day | ORAL | 3 refills | Status: DC

## 2020-12-30 MED ORDER — OZEMPIC (1 MG/DOSE) 4 MG/3ML ~~LOC~~ SOPN: 1.0000 mg | PEN_INJECTOR | SUBCUTANEOUS | 5 refills | Status: DC

## 2020-12-30 MED ORDER — EZETIMIBE 10 MG PO TABS: 10.0000 mg | ORAL_TABLET | Freq: Every day | ORAL | 3 refills | Status: DC

## 2020-12-30 MED ORDER — MECLIZINE HCL 25 MG PO TABS: 25.0000 mg | ORAL_TABLET | Freq: Three times a day (TID) | ORAL | 1 refills | Status: DC | PRN

## 2020-12-30 NOTE — Progress Notes (Signed)
BP 130/74   Pulse 90   Ht 5' 6"  (1.676 m)   Wt 204 lb (92.5 kg)   SpO2 93%   BMI 32.93 kg/m    Subjective:   [name], female    DOB: Jan 28, 1946, 75 y.o.   MRN: [id number]  HPI: [name] is a 75 y.o. female presenting on 12/30/2020 for Anxiety and Dizziness   HPI Anxiety Current rx-Xanax 0.5 mg daily as needed.,  Patient also takes Lexapro and Abilify at night and they do help some as well # meds rx-30/month Effectiveness of current meds-works well Adverse reactions form meds-none  Pill count performed-No Last drug screen -1 year ago, will do today ( high risk q36m moderate risk q630mlow risk yearly ) Urine drug screen today- Yes [place]  If yes were their any concerning findings? -Has still been a little bit early in the last couple months and does have some pain medicine occasionally, recommended to watch the interaction.  We are keeping a close eye  No flowsheet data found.   Controlled substance contract signed on: Today  Type 2 diabetes mellitus Patient comes in today for recheck of his diabetes. Patient has been currently taking Ozempic and Synjardy and glipizide. Patient is currently on an ACE inhibitor/ARB. Patient has seen an ophthalmologist this year. Patient denies any issues with their feet. The symptom started onset as an adult hypertension and hyperlipidemia and neuropathy ARE RELATED TO DM   Hypertension Patient is currently on Toprol hydrochlorothiazide, and their blood pressure today is 130/74. Patient denies any lightheadedness or dizziness. Patient denies headaches, blurred vision, chest pains, shortness of breath, or weakness. Denies any side effects from medication and is content with current medication.   Relevant past medical, surgical, family and social history reviewed and updated as indicated. Interim medical history since our last visit reviewed. Allergies and medications reviewed and updated.  Review of  Systems  Constitutional:  Negative for chills and fever.  Eyes:  Negative for visual disturbance.  Respiratory:  Negative for chest tightness and shortness of breath.   Cardiovascular:  Negative for chest pain and leg swelling.  Musculoskeletal:  Positive for arthralgias and back pain. Negative for gait problem.  Skin:  Negative for rash.  Neurological:  Positive for numbness. Negative for dizziness, weakness, light-headedness and headaches.  Psychiatric/Behavioral:  Negative for agitation and behavioral problems.   All other systems reviewed and are negative.  Per HPI unless specifically indicated above   Allergies as of 12/30/2020       Reactions   Penicillins Other (See Comments)   Blisters in mouth Did it involve swelling of the face/tongue/throat, SOB, or low BP? No Did it involve sudden or severe rash/hives, skin peeling, or any reaction on the inside of your mouth or nose? No Did you need to seek medical attention at a hospital or doctor's office? No When did it last happen?      Childhood allergy If all above answers are "NO", may proceed with cephalosporin use.   Codeine Nausea And Vomiting   Erythromycin Nausea And Vomiting   Fenofibrate Other (See Comments)   aching & edema    Lyrica [pregabalin] Other (See Comments)   Edema in feet   Statins Other (See Comments)   Joints ache   Sulfa Antibiotics Nausea And Vomiting        Medication List        Accurate as of December 30, 2020 11:37 AM.  If you have any questions, ask your nurse or doctor.          Alpha-Lipoic Acid 600 MG Tabs Take 600 mg by mouth daily.   ALPRAZolam 0.5 MG tablet Commonly known as: XANAX Take 1 tablet (0.5 mg total) by mouth daily as needed for anxiety. Take 1-2 tabs daily as needed What changed: when to take this Changed by: Worthy Rancher, MD   ARIPiprazole 2 MG tablet Commonly known as: ABILIFY Take 1 tablet (2 mg total) by mouth daily. What changed:  how much to  take when to take this   Aspercreme Lidocaine 4 % Liqd Generic drug: Lidocaine HCl Apply 1 application topically daily as needed (pain).   atorvastatin 10 MG tablet Commonly known as: LIPITOR Take 1 tablet (10 mg total) by mouth daily.   blood glucose meter kit and supplies Kit Dispense based on patient and insurance preference. Use up to two times daily as directed. (Dx: type 2 DM - E11.9)   clopidogrel 75 MG tablet Commonly known as: PLAVIX Take 1 tablet (75 mg total) by mouth daily.   escitalopram 20 MG tablet Commonly known as: LEXAPRO Take 1 tablet (20 mg total) by mouth daily.   ezetimibe 10 MG tablet Commonly known as: ZETIA Take 1 tablet (10 mg total) by mouth daily.   Fish Oil 1000 MG Caps Take 1,000 mg by mouth daily.   gabapentin 600 MG tablet Commonly known as: NEURONTIN Take 1 tablet (600 mg total) by mouth in the morning, at noon, in the evening, and at bedtime.   glipiZIDE 5 MG 24 hr tablet Commonly known as: GLUCOTROL XL Take 1 tablet (5 mg total) by mouth daily with breakfast.   HYDROcodone-acetaminophen 5-325 MG tablet Commonly known as: NORCO/VICODIN Take 1 tablet by mouth every 6 (six) hours as needed for moderate pain.   lisinopril-hydrochlorothiazide 20-25 MG tablet Commonly known as: ZESTORETIC Take 1 tablet by mouth daily.   meclizine 25 MG tablet Commonly known as: ANTIVERT Take 1 tablet (25 mg total) by mouth 3 (three) times daily as needed for dizziness.   nicotine polacrilex 2 MG gum Commonly known as: NICORETTE Take 2 mg by mouth as needed.   ONE TOUCH ULTRA 2 w/Device Kit CHECK BLOOD SUGAR UP TO TWICE DAILY Dx H29.9   OneTouch Delica Lancets 24Q Misc CHECK BLOOD SUGAR UP TO TWICE DAILY Dx E11.9   OneTouch Ultra test strip Generic drug: glucose blood Check BS up to 2 times daily Dx 11.43   Ozempic (1 MG/DOSE) 4 MG/3ML Sopn Generic drug: Semaglutide (1 MG/DOSE) Inject 1 mg into the skin once a week. What changed: See the  new instructions. Changed by: Fransisca Kaufmann Dejane Scheibe, MD   pantoprazole 40 MG tablet Commonly known as: PROTONIX Take 1 tablet (40 mg total) by mouth daily.   Premarin vaginal cream Generic drug: conjugated estrogens Place vaginally at bedtime. What changed:  how much to take when to take this reasons to take this   Synjardy XR 25-1000 MG Tb24 Generic drug: Empagliflozin-metFORMIN HCl ER Take 1 tablet by mouth daily.   Vitamin D 50 MCG (2000 UT) tablet Take 2,000 Units by mouth daily.         Objective:   BP 130/74   Pulse 90   Ht 5' 6"  (1.676 m)   Wt 204 lb (92.5 kg)   SpO2 93%   BMI 32.93 kg/m   Wt Readings from Last 3 Encounters:  12/30/20 204 lb (92.5 kg)  11/21/20  204 lb (92.5 kg)  09/25/20 205 lb (93 kg)    Physical Exam Vitals and nursing note reviewed.  Constitutional:      General: She is not in acute distress.    Appearance: She is well-developed. She is not diaphoretic.  Eyes:     Conjunctiva/sclera: Conjunctivae normal.  Cardiovascular:     Rate and Rhythm: Normal rate and regular rhythm.     Heart sounds: Normal heart sounds. No murmur heard. Pulmonary:     Effort: Pulmonary effort is normal. No respiratory distress.     Breath sounds: Normal breath sounds. No wheezing.  Musculoskeletal:        General: No swelling or tenderness. Normal range of motion.  Skin:    General: Skin is warm and dry.     Findings: No rash.  Neurological:     Mental Status: She is alert and oriented to person, place, and time.     Coordination: Coordination normal.  Psychiatric:        Behavior: Behavior normal.      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  We will do drug screen and refill medications.  Patient says she gets the occasional vertigo and keeps meclizine on hand.  Follow up plan: No follow-ups on file.  Counseling provided for all of the vaccine components Orders Placed This Encounter  Procedures   ToxASSURE Select 13 (MW), Urine   Bayer DCA Hb A1c Waived    Caryl Pina, MD [facility] 12/30/2020, 11:37 AM

## 2020-12-30 NOTE — Progress Notes (Signed)
Patient's pt/ptt sample hemolized. Will need to recollect day of surgery.

## 2020-12-30 NOTE — Progress Notes (Addendum)
PCP: Caryl Pina, MD Cardiologist: Vashti Hey, MD   EKG: 11/21/20 CXR: na ECHO: 05/04/19 Stress Test: denies Cardiac Cath: denies  Fasting Blood Sugar- 99-180 Checks Blood Sugar_2-3__ times a week  OSA/CPAP: No  ASA: no Blood Thinner: Plavix last dose 12/17/20  Covid test 12/30/20  Anesthesia Review: Yes, per MD order.  On blood Thinner.  Patient denies shortness of breath, fever, cough, and chest pain at PAT appointment.  Patient verbalized understanding of instructions provided today at the PAT appointment.  Patient asked to review instructions at home and day of surgery.

## 2021-01-01 ENCOUNTER — Other Ambulatory Visit: Payer: Self-pay

## 2021-01-01 ENCOUNTER — Ambulatory Visit (HOSPITAL_COMMUNITY): Payer: Medicare Other | Admitting: Physician Assistant

## 2021-01-01 ENCOUNTER — Encounter (HOSPITAL_COMMUNITY): Payer: Self-pay | Admitting: Orthopedic Surgery

## 2021-01-01 ENCOUNTER — Ambulatory Visit (HOSPITAL_COMMUNITY): Payer: Medicare Other

## 2021-01-01 ENCOUNTER — Ambulatory Visit (HOSPITAL_COMMUNITY): Payer: Medicare Other | Admitting: Anesthesiology

## 2021-01-01 ENCOUNTER — Encounter (HOSPITAL_COMMUNITY)
Admission: RE | Disposition: A | Discharge status: Home / Self Care | Payer: Self-pay | Source: Home / Self Care | Attending: Orthopedic Surgery

## 2021-01-01 ENCOUNTER — Observation Stay (HOSPITAL_COMMUNITY)
Admission: RE | Admit: 2021-01-01 | Discharge: 2021-01-03 | Disposition: A | Discharge status: Home / Self Care | Payer: Medicare Other | Attending: Orthopedic Surgery | Admitting: Orthopedic Surgery

## 2021-01-01 DIAGNOSIS — R109 Unspecified abdominal pain: Secondary | ICD-10-CM | POA: Insufficient documentation

## 2021-01-01 DIAGNOSIS — Z79899 Other long term (current) drug therapy: Secondary | ICD-10-CM | POA: Insufficient documentation

## 2021-01-01 DIAGNOSIS — G8929 Other chronic pain: Principal | ICD-10-CM | POA: Diagnosis present

## 2021-01-01 DIAGNOSIS — N2889 Other specified disorders of kidney and ureter: Secondary | ICD-10-CM | POA: Diagnosis not present

## 2021-01-01 DIAGNOSIS — E119 Type 2 diabetes mellitus without complications: Secondary | ICD-10-CM | POA: Diagnosis not present

## 2021-01-01 DIAGNOSIS — R1010 Upper abdominal pain, unspecified: Secondary | ICD-10-CM | POA: Diagnosis not present

## 2021-01-01 DIAGNOSIS — Z96651 Presence of right artificial knee joint: Secondary | ICD-10-CM | POA: Insufficient documentation

## 2021-01-01 DIAGNOSIS — Z4542 Encounter for adjustment and management of neuropacemaker (brain) (peripheral nerve) (spinal cord): Secondary | ICD-10-CM | POA: Diagnosis not present

## 2021-01-01 DIAGNOSIS — Z7984 Long term (current) use of oral hypoglycemic drugs: Secondary | ICD-10-CM | POA: Insufficient documentation

## 2021-01-01 DIAGNOSIS — E785 Hyperlipidemia, unspecified: Secondary | ICD-10-CM | POA: Diagnosis not present

## 2021-01-01 DIAGNOSIS — I10 Essential (primary) hypertension: Secondary | ICD-10-CM | POA: Diagnosis not present

## 2021-01-01 DIAGNOSIS — Z90710 Acquired absence of both cervix and uterus: Secondary | ICD-10-CM | POA: Diagnosis not present

## 2021-01-01 DIAGNOSIS — Z419 Encounter for procedure for purposes other than remedying health state, unspecified: Secondary | ICD-10-CM

## 2021-01-01 DIAGNOSIS — Z87891 Personal history of nicotine dependence: Secondary | ICD-10-CM | POA: Diagnosis not present

## 2021-01-01 DIAGNOSIS — I700 Atherosclerosis of aorta: Secondary | ICD-10-CM | POA: Diagnosis not present

## 2021-01-01 DIAGNOSIS — Z8673 Personal history of transient ischemic attack (TIA), and cerebral infarction without residual deficits: Secondary | ICD-10-CM | POA: Diagnosis not present

## 2021-01-01 DIAGNOSIS — E1143 Type 2 diabetes mellitus with diabetic autonomic (poly)neuropathy: Secondary | ICD-10-CM

## 2021-01-01 DIAGNOSIS — E559 Vitamin D deficiency, unspecified: Secondary | ICD-10-CM | POA: Diagnosis not present

## 2021-01-01 DIAGNOSIS — G894 Chronic pain syndrome: Secondary | ICD-10-CM | POA: Diagnosis not present

## 2021-01-01 DIAGNOSIS — R590 Localized enlarged lymph nodes: Secondary | ICD-10-CM | POA: Diagnosis not present

## 2021-01-01 DIAGNOSIS — G9519 Other vascular myelopathies: Secondary | ICD-10-CM | POA: Diagnosis not present

## 2021-01-01 DIAGNOSIS — E11610 Type 2 diabetes mellitus with diabetic neuropathic arthropathy: Secondary | ICD-10-CM

## 2021-01-01 HISTORY — PX: SPINAL CORD STIMULATOR INSERTION: SHX5378

## 2021-01-01 LAB — GLUCOSE, CAPILLARY
Glucose-Capillary: 106 mg/dL — ABNORMAL HIGH (ref 70–99)
Glucose-Capillary: 122 mg/dL — ABNORMAL HIGH (ref 70–99)
Glucose-Capillary: 119 mg/dL — ABNORMAL HIGH (ref 70–99)
Glucose-Capillary: 161 mg/dL — ABNORMAL HIGH (ref 70–99)

## 2021-01-01 LAB — PROTIME-INR
INR: 0.9 (ref 0.8–1.2)
Prothrombin Time: 12.6 seconds (ref 11.4–15.2)

## 2021-01-01 LAB — APTT: aPTT: 22 seconds — ABNORMAL LOW (ref 24–36)

## 2021-01-01 SURGERY — INSERTION, SPINAL CORD STIMULATOR, LUMBAR
Anesthesia: General

## 2021-01-01 MED ORDER — FENTANYL CITRATE (PF) 250 MCG/5ML IJ SOLN
INTRAMUSCULAR | Status: AC
  Filled 2021-01-01: qty 5

## 2021-01-01 MED ORDER — ROCURONIUM BROMIDE 10 MG/ML (PF) SYRINGE
PREFILLED_SYRINGE | INTRAVENOUS | Status: AC
  Filled 2021-01-01: qty 10

## 2021-01-01 MED ORDER — METFORMIN HCL ER 500 MG PO TB24
1000.0000 mg | ORAL_TABLET | Freq: Every day | ORAL | Status: DC
  Administered 2021-01-02 – 2021-01-03 (×2): 1000 mg via ORAL
  Filled 2021-01-01 (×2): qty 2

## 2021-01-01 MED ORDER — ORAL CARE MOUTH RINSE: 15.0000 mL | Freq: Once | OROMUCOSAL | Status: AC

## 2021-01-01 MED ORDER — GABAPENTIN 600 MG PO TABS
600.0000 mg | ORAL_TABLET | Freq: Two times a day (BID) | ORAL | Status: DC
  Administered 2021-01-01 – 2021-01-02 (×3): 600 mg via ORAL
  Filled 2021-01-01 (×3): qty 1

## 2021-01-01 MED ORDER — ONDANSETRON HCL 4 MG/2ML IJ SOLN
INTRAMUSCULAR | Status: AC
  Filled 2021-01-01: qty 2

## 2021-01-01 MED ORDER — INSULIN ASPART 100 UNIT/ML IJ SOLN: 0.0000 [IU] | Freq: Every day | INTRAMUSCULAR | Status: DC

## 2021-01-01 MED ORDER — ROCURONIUM BROMIDE 100 MG/10ML IV SOLN
INTRAVENOUS | Status: DC | PRN
  Administered 2021-01-01: 10 mg via INTRAVENOUS
  Administered 2021-01-01: 60 mg via INTRAVENOUS

## 2021-01-01 MED ORDER — VANCOMYCIN HCL IN DEXTROSE 1-5 GM/200ML-% IV SOLN
1000.0000 mg | INTRAVENOUS | Status: AC
  Administered 2021-01-01: 1000 mg via INTRAVENOUS
  Filled 2021-01-01: qty 200

## 2021-01-01 MED ORDER — LISINOPRIL 20 MG PO TABS
20.0000 mg | ORAL_TABLET | Freq: Every day | ORAL | Status: DC
  Administered 2021-01-01 – 2021-01-02 (×2): 20 mg via ORAL
  Filled 2021-01-01 (×2): qty 1

## 2021-01-01 MED ORDER — PHENOL 1.4 % MT LIQD: 1.0000 | OROMUCOSAL | Status: DC | PRN

## 2021-01-01 MED ORDER — ONDANSETRON HCL 4 MG/2ML IJ SOLN: 4.0000 mg | Freq: Once | INTRAMUSCULAR | Status: DC | PRN

## 2021-01-01 MED ORDER — LACTATED RINGERS IV SOLN: INTRAVENOUS | Status: DC

## 2021-01-01 MED ORDER — ESCITALOPRAM OXALATE 20 MG PO TABS
20.0000 mg | ORAL_TABLET | Freq: Every day | ORAL | Status: DC
  Administered 2021-01-02: 20 mg via ORAL
  Filled 2021-01-01 (×2): qty 1

## 2021-01-01 MED ORDER — LIDOCAINE HCL (CARDIAC) PF 100 MG/5ML IV SOSY
PREFILLED_SYRINGE | INTRAVENOUS | Status: DC | PRN
Start: 2021-01-01 — End: 2021-01-01
  Administered 2021-01-01: 100 mg via INTRAVENOUS

## 2021-01-01 MED ORDER — ARIPIPRAZOLE 2 MG PO TABS
1.0000 mg | ORAL_TABLET | Freq: Every day | ORAL | Status: DC
  Administered 2021-01-01 – 2021-01-02 (×2): 1 mg via ORAL
  Filled 2021-01-01 (×2): qty 1

## 2021-01-01 MED ORDER — EMPAGLIFLOZIN-METFORMIN HCL ER 25-1000 MG PO TB24: 1.0000 | ORAL_TABLET | Freq: Every day | ORAL | Status: DC

## 2021-01-01 MED ORDER — PROPOFOL 10 MG/ML IV BOLUS
INTRAVENOUS | Status: DC | PRN
  Administered 2021-01-01: 120 mg via INTRAVENOUS

## 2021-01-01 MED ORDER — POLYETHYLENE GLYCOL 3350 17 G PO PACK
17.0000 g | PACK | Freq: Every day | ORAL | Status: DC | PRN
  Administered 2021-01-03: 17 g via ORAL
  Filled 2021-01-01: qty 1

## 2021-01-01 MED ORDER — ACETAMINOPHEN 325 MG PO TABS
650.0000 mg | ORAL_TABLET | ORAL | Status: DC | PRN
  Administered 2021-01-03: 650 mg via ORAL
  Filled 2021-01-01: qty 2

## 2021-01-01 MED ORDER — CEFAZOLIN SODIUM-DEXTROSE 2-4 GM/100ML-% IV SOLN
2.0000 g | Freq: Once | INTRAVENOUS | Status: AC
  Administered 2021-01-01: 2 g via INTRAVENOUS
  Filled 2021-01-01: qty 100

## 2021-01-01 MED ORDER — ALPRAZOLAM 0.5 MG PO TABS
0.5000 mg | ORAL_TABLET | Freq: Every day | ORAL | Status: DC | PRN
  Administered 2021-01-01 – 2021-01-02 (×2): 0.5 mg via ORAL
  Filled 2021-01-01 (×2): qty 1

## 2021-01-01 MED ORDER — METHOCARBAMOL 500 MG PO TABS
500.0000 mg | ORAL_TABLET | Freq: Four times a day (QID) | ORAL | Status: DC | PRN
  Administered 2021-01-01 – 2021-01-03 (×4): 500 mg via ORAL
  Filled 2021-01-01 (×6): qty 1

## 2021-01-01 MED ORDER — THROMBIN 20000 UNITS EX KIT
PACK | CUTANEOUS | Status: AC
  Filled 2021-01-01: qty 1

## 2021-01-01 MED ORDER — MIDAZOLAM HCL 5 MG/5ML IJ SOLN
INTRAMUSCULAR | Status: DC | PRN
  Administered 2021-01-01: 2 mg via INTRAVENOUS

## 2021-01-01 MED ORDER — SODIUM CHLORIDE 0.9% FLUSH: 3.0000 mL | Freq: Two times a day (BID) | INTRAVENOUS | Status: DC

## 2021-01-01 MED ORDER — HYDROCODONE-ACETAMINOPHEN 5-325 MG PO TABS: 1.0000 | ORAL_TABLET | Freq: Four times a day (QID) | ORAL | 0 refills | Status: AC | PRN

## 2021-01-01 MED ORDER — PROPOFOL 10 MG/ML IV BOLUS
INTRAVENOUS | Status: AC
  Filled 2021-01-01: qty 20

## 2021-01-01 MED ORDER — HYDROMORPHONE HCL 1 MG/ML IJ SOLN
0.2500 mg | INTRAMUSCULAR | Status: DC | PRN
  Administered 2021-01-01: 0.5 mg via INTRAVENOUS

## 2021-01-01 MED ORDER — PHENYLEPHRINE HCL-NACL 20-0.9 MG/250ML-% IV SOLN
INTRAVENOUS | Status: DC | PRN
  Administered 2021-01-01: 50 ug/min via INTRAVENOUS

## 2021-01-01 MED ORDER — PANTOPRAZOLE SODIUM 40 MG PO TBEC
40.0000 mg | DELAYED_RELEASE_TABLET | Freq: Every day | ORAL | Status: DC
  Administered 2021-01-02: 40 mg via ORAL
  Filled 2021-01-01: qty 1

## 2021-01-01 MED ORDER — SODIUM CHLORIDE 0.9% FLUSH: 3.0000 mL | INTRAVENOUS | Status: DC | PRN

## 2021-01-01 MED ORDER — HYDROCODONE-ACETAMINOPHEN 7.5-325 MG PO TABS
2.0000 | ORAL_TABLET | ORAL | Status: DC | PRN
  Administered 2021-01-01 – 2021-01-02 (×5): 2 via ORAL
  Filled 2021-01-01 (×5): qty 2

## 2021-01-01 MED ORDER — ONDANSETRON HCL 4 MG PO TABS
4.0000 mg | ORAL_TABLET | Freq: Four times a day (QID) | ORAL | Status: DC | PRN
  Administered 2021-01-02: 4 mg via ORAL
  Filled 2021-01-01: qty 1

## 2021-01-01 MED ORDER — ONDANSETRON HCL 4 MG PO TABS: 4.0000 mg | ORAL_TABLET | Freq: Three times a day (TID) | ORAL | 0 refills | Status: DC | PRN

## 2021-01-01 MED ORDER — BUPIVACAINE-EPINEPHRINE 0.5% -1:200000 IJ SOLN
INTRAMUSCULAR | Status: DC | PRN
  Administered 2021-01-01: 20 mL

## 2021-01-01 MED ORDER — GLIPIZIDE ER 5 MG PO TB24
5.0000 mg | ORAL_TABLET | Freq: Every day | ORAL | Status: DC
  Administered 2021-01-02 – 2021-01-03 (×2): 5 mg via ORAL
  Filled 2021-01-01 (×2): qty 1

## 2021-01-01 MED ORDER — SUGAMMADEX SODIUM 500 MG/5ML IV SOLN
INTRAVENOUS | Status: DC | PRN
  Administered 2021-01-01: 400 mg via INTRAVENOUS

## 2021-01-01 MED ORDER — BUPIVACAINE-EPINEPHRINE 0.5% -1:200000 IJ SOLN
INTRAMUSCULAR | Status: AC
  Filled 2021-01-01: qty 1

## 2021-01-01 MED ORDER — MIDAZOLAM HCL 2 MG/2ML IJ SOLN
INTRAMUSCULAR | Status: AC
  Filled 2021-01-01: qty 2

## 2021-01-01 MED ORDER — METHOCARBAMOL 1000 MG/10ML IJ SOLN
500.0000 mg | Freq: Four times a day (QID) | INTRAVENOUS | Status: DC | PRN
  Filled 2021-01-01: qty 5

## 2021-01-01 MED ORDER — LISINOPRIL-HYDROCHLOROTHIAZIDE 20-25 MG PO TABS: 1.0000 | ORAL_TABLET | Freq: Every day | ORAL | Status: DC

## 2021-01-01 MED ORDER — 0.9 % SODIUM CHLORIDE (POUR BTL) OPTIME
TOPICAL | Status: DC | PRN
  Administered 2021-01-01: 1000 mL

## 2021-01-01 MED ORDER — INSULIN ASPART 100 UNIT/ML IJ SOLN
0.0000 [IU] | Freq: Three times a day (TID) | INTRAMUSCULAR | Status: DC
  Administered 2021-01-02 (×3): 3 [IU] via SUBCUTANEOUS
  Administered 2021-01-03: 2 [IU] via SUBCUTANEOUS

## 2021-01-01 MED ORDER — BUPIVACAINE HCL (PF) 0.25 % IJ SOLN
INTRAMUSCULAR | Status: AC
  Filled 2021-01-01: qty 30

## 2021-01-01 MED ORDER — CHLORHEXIDINE GLUCONATE 0.12 % MT SOLN: 15.0000 mL | Freq: Once | OROMUCOSAL | Status: AC

## 2021-01-01 MED ORDER — EMPAGLIFLOZIN 25 MG PO TABS
25.0000 mg | ORAL_TABLET | Freq: Every day | ORAL | Status: DC
  Administered 2021-01-01 – 2021-01-02 (×2): 25 mg via ORAL
  Filled 2021-01-01 (×2): qty 1

## 2021-01-01 MED ORDER — ACETAMINOPHEN 10 MG/ML IV SOLN
INTRAVENOUS | Status: AC
  Administered 2021-01-01: 1000 mg via INTRAVENOUS
  Filled 2021-01-01: qty 100

## 2021-01-01 MED ORDER — MENTHOL 3 MG MT LOZG: 1.0000 | LOZENGE | OROMUCOSAL | Status: DC | PRN

## 2021-01-01 MED ORDER — ATORVASTATIN CALCIUM 10 MG PO TABS
10.0000 mg | ORAL_TABLET | Freq: Every day | ORAL | Status: DC
  Administered 2021-01-01 – 2021-01-02 (×2): 10 mg via ORAL
  Filled 2021-01-01 (×2): qty 1

## 2021-01-01 MED ORDER — ACETAMINOPHEN 650 MG RE SUPP: 650.0000 mg | RECTAL | Status: DC | PRN

## 2021-01-01 MED ORDER — LIDOCAINE HCL (PF) 2 % IJ SOLN
INTRAMUSCULAR | Status: AC
  Filled 2021-01-01: qty 5

## 2021-01-01 MED ORDER — HYDROMORPHONE HCL 1 MG/ML IJ SOLN
INTRAMUSCULAR | Status: AC
  Administered 2021-01-01: 0.5 mg via INTRAVENOUS
  Filled 2021-01-01: qty 1

## 2021-01-01 MED ORDER — HYDROCODONE-ACETAMINOPHEN 5-325 MG PO TABS
1.0000 | ORAL_TABLET | ORAL | Status: DC | PRN
Start: 2021-01-01 — End: 2021-01-03
  Administered 2021-01-02 (×3): 1 via ORAL
  Filled 2021-01-01 (×3): qty 1

## 2021-01-01 MED ORDER — HEMOSTATIC AGENTS (NO CHARGE) OPTIME
TOPICAL | Status: DC | PRN
  Administered 2021-01-01 (×2): 1 via TOPICAL

## 2021-01-01 MED ORDER — SODIUM CHLORIDE 0.9 % IV SOLN: 250.0000 mL | INTRAVENOUS | Status: DC

## 2021-01-01 MED ORDER — ACETAMINOPHEN 10 MG/ML IV SOLN: 1000.0000 mg | Freq: Once | INTRAVENOUS | Status: DC | PRN

## 2021-01-01 MED ORDER — CHLORHEXIDINE GLUCONATE 0.12 % MT SOLN
OROMUCOSAL | Status: AC
  Administered 2021-01-01: 15 mL via OROMUCOSAL
  Filled 2021-01-01: qty 15

## 2021-01-01 MED ORDER — ONDANSETRON HCL 4 MG/2ML IJ SOLN
4.0000 mg | Freq: Four times a day (QID) | INTRAMUSCULAR | Status: DC | PRN
Start: 2021-01-01 — End: 2021-01-03

## 2021-01-01 MED ORDER — ONDANSETRON HCL 4 MG/2ML IJ SOLN
INTRAMUSCULAR | Status: DC | PRN
  Administered 2021-01-01: 4 mg via INTRAVENOUS

## 2021-01-01 MED ORDER — EZETIMIBE 10 MG PO TABS
10.0000 mg | ORAL_TABLET | Freq: Every day | ORAL | Status: DC
  Administered 2021-01-02: 10 mg via ORAL
  Filled 2021-01-01 (×2): qty 1

## 2021-01-01 MED ORDER — FENTANYL CITRATE (PF) 100 MCG/2ML IJ SOLN
INTRAMUSCULAR | Status: DC | PRN
  Administered 2021-01-01 (×2): 100 ug via INTRAVENOUS

## 2021-01-01 MED ORDER — HYDROCHLOROTHIAZIDE 25 MG PO TABS
25.0000 mg | ORAL_TABLET | Freq: Every day | ORAL | Status: DC
  Administered 2021-01-01 – 2021-01-02 (×2): 25 mg via ORAL
  Filled 2021-01-01 (×2): qty 1

## 2021-01-01 SURGICAL SUPPLY — 62 items
AGENT HMST KT MTR STRL THRMB (HEMOSTASIS) ×2
ANCH LD SWIFT-LCK ×2 IMPLANT
ANCHOR SWIFT LOCK ×2 IMPLANT
BAG COUNTER SPONGE SURGICOUNT (BAG) IMPLANT
BAG SPNG CNTER NS LX DISP (BAG)
CANISTER SUCT 3000ML PPV (MISCELLANEOUS) ×2 IMPLANT
COVER MAYO STAND STRL (DRAPES) ×2 IMPLANT
COVER PROBE W GEL 5X96 (DRAPES) IMPLANT
COVER SURGICAL LIGHT HANDLE (MISCELLANEOUS) ×2 IMPLANT
DRAIN CHANNEL 15F RND FF W/TCR (WOUND CARE) IMPLANT
DRAPE C-ARM 42X72 X-RAY (DRAPES) ×2 IMPLANT
DRAPE SURG 17X23 STRL (DRAPES) ×2 IMPLANT
DRAPE U-SHAPE 47X51 STRL (DRAPES) ×2 IMPLANT
DRSG OPSITE POSTOP 4X6 (GAUZE/BANDAGES/DRESSINGS) ×3 IMPLANT
DURAPREP 26ML APPLICATOR (WOUND CARE) ×2 IMPLANT
ELECT BLADE 4.0 EZ CLEAN MEGAD (MISCELLANEOUS)
ELECT CAUTERY BLADE 6.4 (BLADE) IMPLANT
ELECT PENCIL ROCKER SW 15FT (MISCELLANEOUS) ×2 IMPLANT
ELECT REM PT RETURN 9FT ADLT (ELECTROSURGICAL) ×2
ELECTRODE BLDE 4.0 EZ CLN MEGD (MISCELLANEOUS) IMPLANT
ELECTRODE REM PT RTRN 9FT ADLT (ELECTROSURGICAL) ×1 IMPLANT
GENERATOR PULSE PROCLAIM 5ELIT (Neuro Prosthesis/Implant) IMPLANT
GLOVE SURG ENC MOIS LTX SZ7 (GLOVE) ×2 IMPLANT
GLOVE SURG GAMMEX LF SZ8.5 (GLOVE) ×2 IMPLANT
GLOVE SURG UNDER POLY LF SZ7 (GLOVE) ×2 IMPLANT
GLOVE SURG UNDER POLY LF SZ8.5 (GLOVE) ×2 IMPLANT
GOWN STRL REUS W/ TWL LRG LVL3 (GOWN DISPOSABLE) ×2 IMPLANT
GOWN STRL REUS W/TWL 2XL LVL3 (GOWN DISPOSABLE) ×2 IMPLANT
GOWN STRL REUS W/TWL LRG LVL3 (GOWN DISPOSABLE) ×4
GUIDE ROD (INSTRUMENTS) ×1 IMPLANT
KIT BASIN OR (CUSTOM PROCEDURE TRAY) ×2 IMPLANT
KIT TURNOVER KIT B (KITS) ×2 IMPLANT
LEAD OCTRODE GEN 8CH 60CM (Lead) ×2 IMPLANT
NDL SPNL 18GX3.5 QUINCKE PK (NEEDLE) ×1 IMPLANT
NEEDLE 22X1 1/2 (OR ONLY) (NEEDLE) ×2 IMPLANT
NEEDLE SPNL 18GX3.5 QUINCKE PK (NEEDLE) ×2 IMPLANT
NS IRRIG 1000ML POUR BTL (IV SOLUTION) ×2 IMPLANT
PACK LAMINECTOMY ORTHO (CUSTOM PROCEDURE TRAY) ×2 IMPLANT
PACK UNIVERSAL I (CUSTOM PROCEDURE TRAY) ×2 IMPLANT
PAD ARMBOARD 7.5X6 YLW CONV (MISCELLANEOUS) ×6 IMPLANT
PROGRAMMER DBS W/MAGNET NMRI (MISCELLANEOUS) ×1 IMPLANT
PULSE GENERATOR PROCLAIM 5ELIT (Neuro Prosthesis/Implant) ×2 IMPLANT
SPATULA SILICONE BRAIN 10MM (MISCELLANEOUS) IMPLANT
SPONGE SURGIFOAM ABS GEL 100 (HEMOSTASIS) ×1 IMPLANT
SPONGE T-LAP 18X18 ~~LOC~~+RFID (SPONGE) ×1 IMPLANT
SPONGE T-LAP 4X18 ~~LOC~~+RFID (SPONGE) ×3 IMPLANT
STAPLER VISISTAT 35W (STAPLE) IMPLANT
STRIP CLOSURE SKIN 1/2X4 (GAUZE/BANDAGES/DRESSINGS) ×2 IMPLANT
SURGIFLO W/THROMBIN 8M KIT (HEMOSTASIS) ×3 IMPLANT
SUT BONE WAX W31G (SUTURE) ×1 IMPLANT
SUT ETHIBOND 2 OS 4 DA (SUTURE) ×2 IMPLANT
SUT MNCRL AB 3-0 PS2 18 (SUTURE) ×4 IMPLANT
SUT VIC AB 1 CT1 18XCR BRD 8 (SUTURE) ×2 IMPLANT
SUT VIC AB 1 CT1 8-18 (SUTURE) ×4
SUT VIC AB 2-0 CT1 18 (SUTURE) ×2 IMPLANT
SYR BULB IRRIG 60ML STRL (SYRINGE) ×1 IMPLANT
SYR CONTROL 10ML LL (SYRINGE) ×2 IMPLANT
TOOL TUNNELING 20 (MISCELLANEOUS) ×1 IMPLANT
TOWEL GREEN STERILE (TOWEL DISPOSABLE) ×2 IMPLANT
TOWEL GREEN STERILE FF (TOWEL DISPOSABLE) ×2 IMPLANT
WATER STERILE IRR 1000ML POUR (IV SOLUTION) ×2 IMPLANT
YANKAUER SUCT BULB TIP NO VENT (SUCTIONS) ×2 IMPLANT

## 2021-01-01 NOTE — Transfer of Care (Signed)
Immediate Anesthesia Transfer of Care Note  Patient: [name]  Procedure(s) Performed: SPINAL CORD STIMULATOR PLACEMENT  Patient Location: PACU  Anesthesia Type:General  Level of Consciousness: awake, alert , oriented and patient cooperative  Airway & Oxygen Therapy: Patient Spontanous Breathing and Patient connected to face mask oxygen  Post-op Assessment: Report given to RN, Post -op Vital signs reviewed and stable and Patient moving all extremities X 4  Post vital signs: Reviewed and stable  Last Vitals:  Vitals Value Taken Time  BP 137/102 01/01/21 1014  Temp    Pulse 87 01/01/21 1015  Resp 13 01/01/21 1015  SpO2 95 % 01/01/21 1015  Vitals shown include unvalidated device data.  Last Pain:  Vitals:   01/01/21 0601  TempSrc:   PainSc: 0-No pain         Complications: No notable events documented.

## 2021-01-01 NOTE — Anesthesia Preprocedure Evaluation (Signed)
Anesthesia Evaluation  Patient identified by MRN, date of birth, ID band Patient awake    Reviewed: Allergy & Precautions, NPO status , Patient's Chart, lab work & pertinent test results  Airway Mallampati: II  TM Distance: >3 FB Neck ROM: Full    Dental no notable dental hx.    Pulmonary neg pulmonary ROS, former smoker,    Pulmonary exam normal breath sounds clear to auscultation       Cardiovascular hypertension, Normal cardiovascular exam Rhythm:Regular Rate:Normal     Neuro/Psych Depression TIA   GI/Hepatic Neg liver ROS, GERD  ,  Endo/Other  diabetes  Renal/GU negative Renal ROS  negative genitourinary   Musculoskeletal  (+) Arthritis ,   Abdominal   Peds negative pediatric ROS (+)  Hematology negative hematology ROS (+)   Anesthesia Other Findings   Reproductive/Obstetrics negative OB ROS                             Anesthesia Physical Anesthesia Plan  ASA: 3  Anesthesia Plan: General   Post-op Pain Management:    Induction: Intravenous  PONV Risk Score and Plan: 3 and Ondansetron, Dexamethasone and Treatment may vary due to age or medical condition  Airway Management Planned: Oral ETT  Additional Equipment:   Intra-op Plan:   Post-operative Plan: Extubation in OR  Informed Consent: I have reviewed the patients History and Physical, chart, labs and discussed the procedure including the risks, benefits and alternatives for the proposed anesthesia with the patient or authorized representative who has indicated his/her understanding and acceptance.     Dental advisory given  Plan Discussed with: CRNA and Surgeon  Anesthesia Plan Comments:         Anesthesia Quick Evaluation

## 2021-01-01 NOTE — Discharge Instructions (Addendum)
Spinal Cord Stimulator Care After  This sheet gives you information about how to care for yourself after your procedure. Your health care provider may also give you more specific instructions. If you have problems or questions, contact your health care provider. What can I expect after the procedure? After the procedure, it is common to have: Soreness or pain. Some swelling in the area where the hardware was removed. A small amount of blood or clear fluid coming from your incision. Follow these instructions at home: If you have a cast: Do not stick anything inside the cast to scratch your skin. Doing that increases your risk of infection. Check the skin around the cast every day. Tell your health care provider about any concerns. You may put lotion on dry skin around the edges of the cast. Do not put lotion on the skin underneath the cast. Keep the cast clean and dry. Do not take baths, swim, or use a hot tub until your health care provider approves. Ask your health care provider if you may take showers. You may only be allowed to take sponge baths. Keep the bandage (dressing) dry until your health care provider says it can be removed. Ok to shower in 5 days.    Incision care  Follow instructions from your health care provider about how to take care of your incision. Make sure you: Wash your hands with soap and water before you change your dressing. If soap and water are not available, use hand sanitizer. Change your dressing as told by your health care provider. Leave stitches (sutures), skin glue, or adhesive strips in place. These skin closures may need to stay in place for 2 weeks or longer. If adhesive strip edges start to loosen and curl up, you may trim the loose edges. Do not remove adhesive strips completely unless your health care provider tells you to do that. Check your incision area every day for signs of infection. Check for: Redness. More swelling or pain. More fluid or  blood. Warmth. Pus or a bad smell. Managing pain, stiffness, and swelling  If directed, put ice on the affected area: Put ice in a plastic bag. Place a towel between your skin and the bag. Leave the ice on for 20 minutes, 2-3 times a day. Move your fingers or toes often to avoid stiffness and to lessen swelling.  Driving Do not drive or use heavy machinery while taking prescription pain medicine. Do not drive for 24 hours if you were given a medicine to help you relax (sedative) during your procedure. Ask your health care provider when it is safe to drive if you have a cast, splint, or boot on the affected limb. Activity Ask your health care provider what activities are safe for you during recovery, and ask what activities you need to avoid. Do not use the injured limb to support your body weight until your health care provider says that you can. Do not play contact sports until your health care provider approves. Do exercises as told by your health care provider. Avoid sitting for a long time without moving. Get up and move around at least every few hours. This will help prevent blood clots. General instructions Do not put pressure on any part of the cast or splint until it is fully hardened. This may take several hours. If you are taking prescription pain medicine, take actions to prevent or treat constipation. Your health care provider may recommend that you: Drink enough fluid to keep your  urine pale yellow. Eat foods that are high in fiber, such as fresh fruits and vegetables, whole grains, and beans. Limit foods that are high in fat and processed sugars, such as fried or sweet foods. Take an over-the-counter or prescription medicine for constipation. Do not use any products that contain nicotine or tobacco, such as cigarettes and e-cigarettes. These can delay bone healing after surgery. If you need help quitting, ask your health care provider. Take over-the-counter and prescription  medicines only as told by your health care provider. Keep all follow-up visits as told by your health care provider. This is important. Contact a health care provider if: You have lasting pain. You have redness around your incision. You have more swelling or pain around your incision. You have more fluid or blood coming from your incision. Your incision feels warm to the touch. You have pus or a bad smell coming from your incision. You are unable to do exercises or physical activity as told by your health care provider. Get help right away if: You have difficulty breathing. You have chest pain. You have severe pain. You have a fever or chills. You have numbness for more than 24 hours in the area where the hardware was removed. Summary After the procedure, it is common to have some pain and swelling in the area where the hardware was removed. Follow instructions from your health care provider about how to take care of your incision. Return to your normal activities as told by your health care provider. Ask your health care provider what activities are safe for you. This information is not intended to replace advice given to you by your health care provider. Make sure you discuss any questions you have with your health care provider.    Restart Plavix and 01/03/2021.  Call MD if there is hematoma / bleeding, develop weakness in the lower extremity, or loss in bowel and bladder control.

## 2021-01-01 NOTE — H&P (Signed)
Addendum H&P: Patient continues to have significant chronic debilitating pain.  She had a successful spinal cord stimulator trial, and presents now for permanent implantation.  I have gone over the risks, benefits, and alternatives to surgery and all of her questions were encouraged and addressed.  She is expressed a willingness and a desire to move forward with surgery.  There has been no change in her clinical exam since her last office evaluation on 12/20/2020.

## 2021-01-01 NOTE — Anesthesia Postprocedure Evaluation (Signed)
Anesthesia Post Note  Patient: NESHIA MCKENZIE  Procedure(s) Performed: [place]     Patient location during evaluation: PACU Anesthesia Type: General Level of consciousness: awake and alert Pain management: pain level controlled Vital Signs Assessment: post-procedure vital signs reviewed and stable Respiratory status: spontaneous breathing, nonlabored ventilation, respiratory function stable and patient connected to nasal cannula oxygen Cardiovascular status: blood pressure returned to baseline and stable Postop Assessment: no apparent nausea or vomiting Anesthetic complications: no   No notable events documented.  Last Vitals:  Vitals:   01/01/21 1020 01/01/21 1030  BP: 131/75 (!) 124/57  Pulse: 88 84  Resp: (!) 9 11  Temp:    SpO2: 100% 99%    Last Pain:  Vitals:   01/01/21 1030  TempSrc:   PainSc: 9                  Gwynn Crossley S

## 2021-01-01 NOTE — Op Note (Signed)
OPERATIVE REPORT  DATE OF SURGERY: 01/01/2021  PATIENT NAME:  [name] MRN: [id number] DOB: 10/13/1945  PCP: Dettinger, Fransisca Kaufmann, MD  PRE-OPERATIVE DIAGNOSIS: Chronic pain syndrome -successful trial spinal cord stimulator  POST-OPERATIVE DIAGNOSIS: Same  PROCEDURE:   Permanent spinal cord stimulator placement  SURGEON:  Melina Schools, MD  PHYSICIAN ASSISTANT: Nelson Chimes, PA  ANESTHESIA:   General  EBL: 518 ml   Complications: None  Implants: Abbott spine: Proclaim XR battery.  Percutaneous octrode lead x2  BRIEF HISTORY: [name] is a 75 y.o. female who has had significant back and intermittent neuropathic leg pain for some time.  She recently had a trial spinal cord stimulator placed and noted significant improvement in her quality of life and pain.  As a result of the successful implantation of the trial device we elected to move forward with surgery.  All appropriate risks, benefits and alternatives to permanent spinal cord stimulator implantation were discussed with the patient and consent was obtained.  PROCEDURE DETAILS: Patient was brought into the operating room and was properly positioned on the operating room table.  After induction with general anesthesia the patient was endotracheally intubated.  A timeout was taken to confirm all important data: including patient, procedure, and the level. Teds, SCD's were applied.   Patient was turned prone onto the Wilson frame and all bony prominences were well-padded.  Thoracolumbar spine was prepped and draped in standard fashion.  The incision sites for the battery and the lead were infiltrated with 1/2 percent Marcaine with epinephrine. Incision over the thoracolumbar spine was made and sharp dissection was carried out down to the deep fascia.  I incised the deep fascia and stripped it to expose the inferior portion of the T10 spinous process, the entire T11 spinous process, and superior aspect of the T12.  I stripped  the paraspinal muscles to expose out to the facet complex.  Self-retaining retractor was placed and hemostasis was obtained using bipolar electrocautery.  I then placed a Penfield 4 I asked the T10-11 interspinous process space and took an x-ray to confirm I was at the appropriate level.  Once I confirm the appropriate level I proceeded with the laminotomy of T10 using a double-action Leksell rongeur remove the inferior third of the T10 spinous processes.  I then exposed and removed the underlying ligamentum flavum and used a Kerrison rongeur.  The dorsal epidural fat was now exposed.  I gently dissected through this until I could see the dorsal aspect of the thecal sac.  With this visualized I then obtained the first percutaneously and advanced it under the T10 lamina up to the T8-9 disc space.  I confirmed satisfactory position in the AP and lateral planes.  The lead was able passed without resistance.  I confirmed with the Abbott representative that it was spanning the appropriate location for adequate programming.  A second lead was then placed just to the left side of this.  I again confirmed proper position in both planes as well as with the representative that I was covering the appropriate area for comfort once this was confirmed I then secured the percutaneous leads directly to the T11 spinous process with an Ethibond suture.  Once it was secured I then made a small incision in the T11-12 interspinous process ligament so I could wrap the leads around the T11 spinous process.  With the leads now secured in place I made a second incision in the left gluteal region and created a pocket  approximately 2-1/2 cm deep.  I used the submuscular passer to advance the leads from the thoracic wound to the gluteal wound.  This point both wounds were copiously irrigated with normal saline and I confirmed hemostasis using bipolar cautery and Floseal.  Prior to bringing the battery on the field I disconnected the Bovie and  bipolar to prevent inadvertent to the battery.  The leads were connected to the battery and the excess lead was wrapped underneath the inferior surface of the and was put into the pocket.  I then secured it directly to the deep fascia with two #1 Vicryl sutures.  At this point the representative tested the leads and they were functioning appropriately.  I did tighten the leads into the battery and torqued the locking screw according manufacture standards.  At this point both wounds were copiously irrigated with normal saline and hemostasis was confirmed.  The deep fascia of both wounds were closed with interrupted #1 Vicryl suture.  Final x-rays were taken which demonstrate satisfactory positioning of both leads.  Superficial wounds were closed with interrupted 2-0 Vicryl suture, and 3-0 Monocryl for the skin.  Steri-Strips and dry dressings were applied and the patient was ultimately extubated and transferred the PACU without incident.  The end of the case all needle and sponge counts were correct.  There were no adverse intraoperative events.  Melina Schools, MD 01/01/2021 9:37 AM

## 2021-01-01 NOTE — Anesthesia Procedure Notes (Signed)
Procedure Name: Intubation Date/Time: 01/01/2021 7:46 AM Performed by: Jonna Munro, CRNA Pre-anesthesia Checklist: Patient identified, Emergency Drugs available, Suction available, Patient being monitored and Timeout performed Patient Re-evaluated:Patient Re-evaluated prior to induction Oxygen Delivery Method: Circle system utilized Preoxygenation: Pre-oxygenation with 100% oxygen Induction Type: IV induction Ventilation: Mask ventilation without difficulty Laryngoscope Size: Mac and 3 Grade View: Grade I Tube type: Oral Tube size: 7.0 mm Number of attempts: 1 Airway Equipment and Method: Stylet Placement Confirmation: ETT inserted through vocal cords under direct vision, positive ETCO2 and breath sounds checked- equal and bilateral Secured at: 22 cm Tube secured with: Tape Dental Injury: Teeth and Oropharynx as per pre-operative assessment

## 2021-01-01 NOTE — Progress Notes (Signed)
Pharmacy Antibiotic Note  AZAELA CARACCI is a 75 y.o. female admitted on 01/01/2021 with surgical prophylaxis.  Pharmacy has been consulted for vancomycin dosing if penicillin allergic. Patient 's PCN allergy is from childhood and patient has tolerated cefazolin before.   Plan: Cefazolin 2g x1   Height: 5\' 6"  (167.6 cm) Weight: 92.1 kg (203 lb) IBW/kg (Calculated) : 59.3  Temp (24hrs), Avg:98 F (36.7 C), Min:97.8 F (36.6 C), Max:98.2 F (36.8 C)  Recent Labs  Lab 12/30/20 1423  WBC 8.9  CREATININE 0.84    Estimated Creatinine Clearance: 66.1 mL/min (by C-G formula based on SCr of 0.84 mg/dL).    Allergies  Allergen Reactions   Penicillins Other (See Comments)    Blisters in mouth Did it involve swelling of the face/tongue/throat, SOB, or low BP? No Did it involve sudden or severe rash/hives, skin peeling, or any reaction on the inside of your mouth or nose? No Did you need to seek medical attention at a hospital or doctor's office? No When did it last happen?      Childhood allergy If all above answers are "NO", may proceed with cephalosporin use.    Codeine Nausea And Vomiting   Erythromycin Nausea And Vomiting   Fenofibrate Other (See Comments)    aching & edema     Lyrica [Pregabalin] Other (See Comments)    Edema in feet    Statins Other (See Comments)    Joints ache   Sulfa Antibiotics Nausea And Vomiting      Thank you for allowing pharmacy to be a part of this patient's care.  Benetta Spar, PharmD, BCPS, BCCP Clinical Pharmacist  Please check AMION for all [place] phone numbers After 10:00 PM, call [place] [phone]

## 2021-01-01 NOTE — Brief Op Note (Signed)
01/01/2021  9:48 AM  PATIENT:  [name]  75 y.o. 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

## 2021-01-02 ENCOUNTER — Encounter (HOSPITAL_COMMUNITY): Payer: Self-pay | Admitting: Orthopedic Surgery

## 2021-01-02 LAB — GLUCOSE, CAPILLARY
Glucose-Capillary: 180 mg/dL — ABNORMAL HIGH (ref 70–99)
Glucose-Capillary: 165 mg/dL — ABNORMAL HIGH (ref 70–99)
Glucose-Capillary: 165 mg/dL — ABNORMAL HIGH (ref 70–99)
Glucose-Capillary: 154 mg/dL — ABNORMAL HIGH (ref 70–99)

## 2021-01-02 LAB — TOXASSURE SELECT 13 (MW), URINE

## 2021-01-02 MED ORDER — HYDROCODONE-ACETAMINOPHEN 7.5-325 MG PO TABS
1.0000 | ORAL_TABLET | ORAL | Status: DC | PRN
  Administered 2021-01-02: 1 via ORAL
  Administered 2021-01-03: 2 via ORAL
  Administered 2021-01-03: 1 via ORAL
  Filled 2021-01-02 (×2): qty 2

## 2021-01-02 MED ORDER — SORBITOL 70 % SOLN
30.0000 mL | Freq: Every day | Status: DC | PRN
  Administered 2021-01-02: 30 mL via ORAL
  Filled 2021-01-02: qty 30

## 2021-01-02 MED ORDER — FLEET ENEMA 7-19 GM/118ML RE ENEM
1.0000 | ENEMA | Freq: Every day | RECTAL | Status: DC | PRN
  Administered 2021-01-02 – 2021-01-03 (×2): 1 via RECTAL
  Filled 2021-01-02: qty 1

## 2021-01-02 MED ORDER — BISACODYL 5 MG PO TBEC
5.0000 mg | DELAYED_RELEASE_TABLET | Freq: Every day | ORAL | Status: DC | PRN
  Administered 2021-01-02: 5 mg via ORAL
  Filled 2021-01-02: qty 1

## 2021-01-02 NOTE — Evaluation (Signed)
Occupational Therapy Evaluation/Discharge Patient Details Name: [name] MRN: [id number] DOB: Aug 10, 1945 Today's Date: 01/02/2021   History of Present Illness 75 y.o. female presents to [facility] hospital on 01/01/2021 with chronic debilitating back pain. Pt underwent successful spinal cord stimulator trial prior to admission. Pt underwent permanent spinal cord stimulator placement on 01/01/2021. PMH includes DMII, ataxia, depression, GERD, DDD.   Clinical Impression   PTA, pt lives with spouse and reports Independence with ADLs/mobility without use of AD. Pt presents now primarily limited by pain (initially neck, then lower back with activity). Educated on spinal precautions for bed mobility, transfers and ADLs with pt requiring Setup for UB ADLs and Min A for LB ADLs. Pt able to mobilize to/from bathroom at min guard with pt preference for handheld assist initially. Anticipate once pain improves, pt will return to PLOF quickly without need for OT follow-up. Husband present during session and endorses ability to assist pt as needed. Encouraged use of shower chair at home, as well as consideration of RW use for mobility initially.       Recommendations for follow up therapy are one component of a multi-disciplinary discharge planning process, led by the attending physician.  Recommendations may be updated based on patient status, additional functional criteria and insurance authorization.   Follow Up Recommendations  No OT follow up;Supervision - Intermittent    Equipment Recommendations  None recommended by OT    Recommendations for Other Services       Precautions / Restrictions Precautions Precautions: Fall;Back Precaution Booklet Issued: Yes (comment) Precaution Comments: no brace needed Restrictions Weight Bearing Restrictions: No      Mobility Bed Mobility Overal bed mobility: Modified Independent             General bed mobility comments: HOB slightly elevated, educated on  log rolling but pt reports preference to complete bed mobility a different way    Transfers Overall transfer level: Needs assistance Equipment used: 1 person hand held assist Transfers: Sit to/from Stand Sit to Stand: Min guard         General transfer comment: min guard for sit to stand from bedside with pt reaching to OT's hand. Pt able to stand from regular toilet and exit bathroom without assist    Balance Overall balance assessment: Needs assistance Sitting-balance support: No upper extremity supported;Feet supported Sitting balance-Leahy Scale: Fair     Standing balance support: Single extremity supported;During functional activity Standing balance-Leahy Scale: Fair Standing balance comment: fair static standing, reaches out for support during mobility                           ADL either performed or assessed with clinical judgement   ADL Overall ADL's : Needs assistance/impaired Eating/Feeding: Independent   Grooming: Set up;Standing   Upper Body Bathing: Set up;Sitting   Lower Body Bathing: Minimal assistance;Sit to/from stand   Upper Body Dressing : Set up;Sitting   Lower Body Dressing: Moderate assistance;Sit to/from stand   Toilet Transfer: Min guard;Ambulation;Regular Toilet;Grab bars Toilet Transfer Details (indicate cue type and reason): min guard with light handheld assist preferred by pt. able to ambulate through/out of bathroom doorway without assist Toileting- Clothing Manipulation and Hygiene: Supervision/safety;Sit to/from stand Toileting - Clothing Manipulation Details (indicate cue type and reason): no assist needed       General ADL Comments: Pt with perseveration on pain and internally distracted throughout education on spinal precautions. husband present and engaged in education  Vision Baseline Vision/History: 0 No visual deficits Ability to See in Adequate Light: 0 Adequate Patient Visual Report: No change from  baseline Vision Assessment?: No apparent visual deficits     Perception     Praxis      Pertinent Vitals/Pain Pain Assessment: Faces Faces Pain Scale: Hurts even more Pain Location: initially neck, then low back Pain Descriptors / Indicators: Grimacing;Guarding;Discomfort Pain Intervention(s): Monitored during session;Limited activity within patient's tolerance;Premedicated before session;Repositioned     Hand Dominance Right   Extremity/Trunk Assessment Upper Extremity Assessment Upper Extremity Assessment: Overall WFL for tasks assessed   Lower Extremity Assessment Lower Extremity Assessment: Defer to PT evaluation   Cervical / Trunk Assessment Cervical / Trunk Assessment: Normal   Communication Communication Communication: No difficulties   Cognition Arousal/Alertness: Awake/alert Behavior During Therapy: WFL for tasks assessed/performed (tearful) Overall Cognitive Status: Within Functional Limits for tasks assessed                                 General Comments: WFL cogntively, some difficulty problem solving best positioning in bed. perserverating on pain and tearful, difficult to redirect   General Comments  Husband present during session. Attempted to reposition pt higher in bed to improve joint alignment and decrease pain though pt continued to slide self down towards foot of bed. perseverating on pain and unable to redirect for optimal positioning    Exercises     Shoulder Instructions      Home Living Family/patient expects to be discharged to:: Private residence Living Arrangements: Spouse/significant other Available Help at Discharge: Family;Available 24 hours/day Type of Home: House Home Access: Stairs to enter [employer] of Steps: 2 Entrance Stairs-Rails: None Home Layout: Two level;Able to live on main level with bedroom/bathroom     Bathroom Shower/Tub: Occupational psychologist: Handicapped height     Home  Equipment: Clinical cytogeneticist - 2 wheels;Hand held shower head   Additional Comments: sleeps in recliner      Prior Functioning/Environment Level of Independence: Independent        Comments: no use of AD, independent with ADLs        OT Problem List:        OT Treatment/Interventions:      OT Goals(Current goals can be found in the care plan section) Acute Rehab OT Goals Patient Stated Goal: decrease pain OT Goal Formulation: All assessment and education complete, DC therapy  OT Frequency:     Barriers to D/C:            Co-evaluation              AM-PAC OT "6 Clicks" Daily Activity     Outcome Measure Help from another person eating meals?: None Help from another person taking care of personal grooming?: A Little Help from another person toileting, which includes using toliet, bedpan, or urinal?: A Little Help from another person bathing (including washing, rinsing, drying)?: A Little Help from another person to put on and taking off regular upper body clothing?: A Little Help from another person to put on and taking off regular lower body clothing?: A Little 6 Click Score: 19   End of Session Nurse Communication: Mobility status;Other (comment) (nausea)  Activity Tolerance: Patient limited by pain Patient left: in bed;with call bell/phone within reach;with family/visitor present  OT Visit Diagnosis: Unsteadiness on feet (R26.81);Other abnormalities of gait and mobility (R26.89);Pain Pain - part of  body:  (back)                Time: 5909-3112 OT Time Calculation (min): 18 min Charges:  OT General Charges $OT Visit: 1 Visit OT Evaluation $OT Eval Low Complexity: 1 Low  Malachy Chamber, OTR/L Acute Rehab Services Office: [phone]   Layla Maw 01/02/2021, 8:06 AM

## 2021-01-02 NOTE — Evaluation (Signed)
Physical Therapy Evaluation Patient Details Name: EUSTOLIA DRENNEN MRN: [id number] DOB: [birth year] Today's Date: 01/02/2021  History of Present Illness  75 y.o. female presents to [facility] hospital on 01/01/2021 with chronic debilitating back pain. Pt underwent successful spinal cord stimulator trial prior to admission. Pt underwent permanent spinal cord stimulator placement on 01/01/2021. PMH includes DMII, ataxia, depression, GERD, DDD.  Clinical Impression  Pt presents to PT with deficits in activity tolerance, power, gait, balance, knowledge of back precautions. Pt is able to ambulate household distances and negotiate stairs without physical assistance. Pt is limited by reports of significant low back pain as well as nausea. Pt is encouraged to mobilize frequently to aide in reducing back pain and improving comfort. Pt will benefit from continued acute PT services to reduce falls risk and aide in a return to independence. PT recommends discharge home, no PT services.       Recommendations for follow up therapy are one component of a multi-disciplinary discharge planning process, led by the attending physician.  Recommendations may be updated based on patient status, additional functional criteria and insurance authorization.  Follow Up Recommendations No PT follow up    Equipment Recommendations  None recommended by PT    Recommendations for Other Services       Precautions / Restrictions Precautions Precautions: Fall;Back Precaution Booklet Issued: Yes (comment) Precaution Comments: no brace needed Restrictions Weight Bearing Restrictions: No      Mobility  Bed Mobility Overal bed mobility: Needs Assistance Bed Mobility: Supine to Sit     Supine to sit: Supervision;HOB elevated     General bed mobility comments: PT provides cues for rolling and sidelying to sit however pt performs supine to sit    Transfers Overall transfer level: Needs assistance Equipment used: None Transfers:  Sit to/from Stand Sit to Stand: Supervision         General transfer comment: min guard for sit to stand from bedside with pt reaching to OT's hand. Pt able to stand from regular toilet and exit bathroom without assist  Ambulation/Gait Ambulation/Gait assistance: Supervision Gait Distance (Feet): 300 Feet (2 brief standing rest breaks due to reports of nausea) Assistive device: None Gait Pattern/deviations: Step-through pattern Gait velocity: functional Gait velocity interpretation: >2.62 ft/sec, indicative of community ambulatory General Gait Details: pt with steady step-through gait, mild lateral drift in gait path  Stairs Stairs: Yes Stairs assistance: Supervision Stair Management: Two rails;Step to pattern Number of Stairs: 3    Wheelchair Mobility    Modified Rankin (Stroke Patients Only)       Balance Overall balance assessment: Needs assistance Sitting-balance support: No upper extremity supported;Feet supported Sitting balance-Leahy Scale: Good     Standing balance support: No upper extremity supported Standing balance-Leahy Scale: Fair Standing balance comment: fair static standing, reaches out for support during mobility                             Pertinent Vitals/Pain Pain Assessment: 0-10 Pain Score: 8  Faces Pain Scale: Hurts even more Pain Location: back Pain Descriptors / Indicators: Aching Pain Intervention(s): Monitored during session    Home Living Family/patient expects to be discharged to:: Private residence Living Arrangements: Spouse/significant other Available Help at Discharge: Family;Available 24 hours/day Type of Home: House Home Access: Stairs to enter Entrance Stairs-Rails: None Entrance Stairs-Number of Steps: 2 Home Layout: Two level;Able to live on main level with bedroom/bathroom Home Equipment: Shower seat;Walker - 2 wheels;Hand held  shower head;Cane - single point Additional Comments: sleeps in recliner     Prior Function Level of Independence: Independent         Comments: no use of AD, independent with ADLs     Hand Dominance   Dominant Hand: Right    Extremity/Trunk Assessment   Upper Extremity Assessment Upper Extremity Assessment: Overall WFL for tasks assessed    Lower Extremity Assessment Lower Extremity Assessment: Overall WFL for tasks assessed    Cervical / Trunk Assessment Cervical / Trunk Assessment: Other exceptions Cervical / Trunk Exceptions: dressing clean dry and intact  Communication   Communication: No difficulties  Cognition Arousal/Alertness: Awake/alert Behavior During Therapy: WFL for tasks assessed/performed Overall Cognitive Status: Within Functional Limits for tasks assessed                                 General Comments: WFL cogntively, some difficulty problem solving best positioning in bed. perserverating on pain and tearful, difficult to redirect      General Comments General comments (skin integrity, edema, etc.): VSS on RA    Exercises     Assessment/Plan    PT Assessment Patient needs continued PT services  PT Problem List Decreased activity tolerance;Decreased balance;Decreased mobility;Decreased knowledge of precautions;Pain       PT Treatment Interventions DME instruction;Gait training;Stair training;Therapeutic activities;Functional mobility training;Therapeutic exercise;Balance training;Neuromuscular re-education;Patient/family education    PT Goals (Current goals can be found in the Care Plan section)  Acute Rehab PT Goals Patient Stated Goal: to reduce pain PT Goal Formulation: With patient Time For Goal Achievement: 01/06/21 Potential to Achieve Goals: Good Additional Goals Additional Goal #1: Pt will score >19/24 on DGI to indicate a reduced risk for falls    Frequency Min 5X/week   Barriers to discharge        Co-evaluation               AM-PAC PT "6 Clicks" Mobility  Outcome Measure  Help needed turning from your back to your side while in a flat bed without using bedrails?: A Little Help needed moving from lying on your back to sitting on the side of a flat bed without using bedrails?: A Little Help needed moving to and from a bed to a chair (including a wheelchair)?: A Little Help needed standing up from a chair using your arms (e.g., wheelchair or bedside chair)?: A Little Help needed to walk in hospital room?: A Little Help needed climbing 3-5 steps with a railing? : A Little 6 Click Score: 18    End of Session   Activity Tolerance: Patient limited by pain Patient left: in chair;with call bell/phone within reach;with family/visitor present Nurse Communication: Mobility status PT Visit Diagnosis: Other abnormalities of gait and mobility (R26.89);Pain Pain - part of body:  (back)    Time: 3557-3220 PT Time Calculation (min) (ACUTE ONLY): 11 min   Charges:   PT Evaluation $PT Eval Low Complexity: 1 Low          Zenaida Niece, PT, DPT Acute Rehabilitation Pager: [phone]   Zenaida Niece 01/02/2021, 10:50 AM

## 2021-01-02 NOTE — Progress Notes (Signed)
    Subjective: Procedure(s) (LRB): SPINAL CORD STIMULATOR PLACEMENT (N/A) 1 Day Post-Op  Patient reports pain as 5 on 0-10 scale.  Reports decreased leg pain reports incisional back pain   Positive void Negative bowel movement Negative flatus Negative chest pain or shortness of breath  Objective: Vital signs in last 24 hours: Temp:  [98.2 F (36.8 C)-99.2 F (37.3 C)] 98.4 F (36.9 C) (09/29 0734) Pulse Rate:  [80-96] 88 (09/29 0734) Resp:  [16-20] 18 (09/29 0734) BP: (102-139)/(55-75) 139/72 (09/29 0734) SpO2:  [92 %-95 %] 92 % (09/29 0734)  Intake/Output from previous day: 09/28 0701 - 09/29 0700 In: 1100 [P.O.:600; I.V.:500] Out: 100 [Blood:100]  Labs: Recent Labs    12/30/20 1423  WBC 8.9  RBC 4.91  HCT 39.5  PLT 348   Recent Labs    12/30/20 1423  NA 135  K 3.9  CL 97*  CO2 26  BUN 10  CREATININE 0.84  GLUCOSE 106*  CALCIUM 9.1   Recent Labs    01/01/21 0600  INR 0.9    Physical Exam: Neurologically intact ABD soft Intact pulses distally Incision: dressing C/D/I and no drainage Compartment soft Body mass index is 32.77 kg/m.   Assessment/Plan: Patient stable  Continue mobilization with physical therapy Continue care  Overall patient is stable.  She is complaining of incisional pain but she has no focal neurological deficits.  Plan on discharge today or tomorrow provided she has positive flatus or bowel movement.  She will follow-up with me in 2 weeks.  Melina Schools, MD [facility] [phone]

## 2021-01-03 ENCOUNTER — Encounter (HOSPITAL_COMMUNITY): Payer: Self-pay | Admitting: *Deleted

## 2021-01-03 ENCOUNTER — Emergency Department (HOSPITAL_COMMUNITY)
Admission: EM | Admit: 2021-01-03 | Discharge: 2021-01-04 | Disposition: A | Discharge status: Home / Self Care | Payer: Medicare Other | Source: Home / Self Care | Attending: Emergency Medicine | Admitting: Emergency Medicine

## 2021-01-03 ENCOUNTER — Other Ambulatory Visit: Payer: Self-pay

## 2021-01-03 ENCOUNTER — Ambulatory Visit: Payer: Self-pay

## 2021-01-03 DIAGNOSIS — K219 Gastro-esophageal reflux disease without esophagitis: Secondary | ICD-10-CM | POA: Insufficient documentation

## 2021-01-03 DIAGNOSIS — I10 Essential (primary) hypertension: Secondary | ICD-10-CM | POA: Insufficient documentation

## 2021-01-03 DIAGNOSIS — Z7984 Long term (current) use of oral hypoglycemic drugs: Secondary | ICD-10-CM | POA: Insufficient documentation

## 2021-01-03 DIAGNOSIS — Z87891 Personal history of nicotine dependence: Secondary | ICD-10-CM | POA: Insufficient documentation

## 2021-01-03 DIAGNOSIS — R1010 Upper abdominal pain, unspecified: Secondary | ICD-10-CM

## 2021-01-03 DIAGNOSIS — E119 Type 2 diabetes mellitus without complications: Secondary | ICD-10-CM | POA: Insufficient documentation

## 2021-01-03 DIAGNOSIS — Z79899 Other long term (current) drug therapy: Secondary | ICD-10-CM | POA: Insufficient documentation

## 2021-01-03 DIAGNOSIS — R1084 Generalized abdominal pain: Secondary | ICD-10-CM | POA: Insufficient documentation

## 2021-01-03 DIAGNOSIS — Z96653 Presence of artificial knee joint, bilateral: Secondary | ICD-10-CM | POA: Insufficient documentation

## 2021-01-03 DIAGNOSIS — R52 Pain, unspecified: Secondary | ICD-10-CM

## 2021-01-03 DIAGNOSIS — Z7902 Long term (current) use of antithrombotics/antiplatelets: Secondary | ICD-10-CM | POA: Insufficient documentation

## 2021-01-03 DIAGNOSIS — M545 Low back pain, unspecified: Secondary | ICD-10-CM | POA: Insufficient documentation

## 2021-01-03 DIAGNOSIS — R110 Nausea: Secondary | ICD-10-CM | POA: Insufficient documentation

## 2021-01-03 LAB — CBC WITH DIFFERENTIAL/PLATELET
Abs Immature Granulocytes: 0.06 10*3/uL (ref 0.00–0.07)
Basophils Absolute: 0 10*3/uL (ref 0.0–0.1)
Basophils Relative: 0 %
Eosinophils Absolute: 0.1 10*3/uL (ref 0.0–0.5)
Eosinophils Relative: 1 %
HCT: 38 % (ref 36.0–46.0)
Hemoglobin: 11.4 g/dL — ABNORMAL LOW (ref 12.0–15.0)
Immature Granulocytes: 1 %
Lymphocytes Relative: 13 %
Lymphs Abs: 1.6 10*3/uL (ref 0.7–4.0)
MCH: 23.9 pg — ABNORMAL LOW (ref 26.0–34.0)
MCHC: 30 g/dL (ref 30.0–36.0)
MCV: 79.8 fL — ABNORMAL LOW (ref 80.0–100.0)
Monocytes Absolute: 1.3 10*3/uL — ABNORMAL HIGH (ref 0.1–1.0)
Monocytes Relative: 11 %
Neutro Abs: 9.3 10*3/uL — ABNORMAL HIGH (ref 1.7–7.7)
Neutrophils Relative %: 74 %
Platelets: 287 10*3/uL (ref 150–400)
RBC: 4.76 MIL/uL (ref 3.87–5.11)
RDW: 16.6 % — ABNORMAL HIGH (ref 11.5–15.5)
WBC: 12.4 10*3/uL — ABNORMAL HIGH (ref 4.0–10.5)
nRBC: 0 % (ref 0.0–0.2)

## 2021-01-03 LAB — URINALYSIS, ROUTINE W REFLEX MICROSCOPIC
Bacteria, UA: NONE SEEN
Bilirubin Urine: NEGATIVE
Glucose, UA: >=500 mg/dL — AB
Hgb urine dipstick: NEGATIVE
Ketones, ur: 20 mg/dL — AB
Leukocytes,Ua: NEGATIVE
Nitrite: NEGATIVE
Protein, ur: NEGATIVE mg/dL
Specific Gravity, Urine: 1.027 (ref 1.005–1.030)
pH: 6 (ref 5.0–8.0)

## 2021-01-03 LAB — COMPREHENSIVE METABOLIC PANEL
ALT: 20 U/L (ref 0–44)
AST: 22 U/L (ref 15–41)
Albumin: 3.1 g/dL — ABNORMAL LOW (ref 3.5–5.0)
Alkaline Phosphatase: 57 U/L (ref 38–126)
Anion gap: 14 (ref 5–15)
BUN: 9 mg/dL (ref 8–23)
CO2: 23 mmol/L (ref 22–32)
Calcium: 9.1 mg/dL (ref 8.9–10.3)
Chloride: 98 mmol/L (ref 98–111)
Creatinine, Ser: 0.67 mg/dL (ref 0.44–1.00)
GFR, Estimated: >60 mL/min (ref >60)
Glucose, Bld: 129 mg/dL — ABNORMAL HIGH (ref 70–99)
Potassium: 3.9 mmol/L (ref 3.5–5.1)
Sodium: 135 mmol/L (ref 135–145)
Total Bilirubin: 0.8 mg/dL (ref 0.3–1.2)
Total Protein: 6.6 g/dL (ref 6.5–8.1)

## 2021-01-03 LAB — GLUCOSE, CAPILLARY: Glucose-Capillary: 123 mg/dL — ABNORMAL HIGH (ref 70–99)

## 2021-01-03 LAB — LIPASE, BLOOD: Lipase: 27 U/L (ref 11–51)

## 2021-01-03 MED ORDER — SENNOSIDES-DOCUSATE SODIUM 8.6-50 MG PO TABS: 1.0000 | ORAL_TABLET | Freq: Every evening | ORAL | Status: DC | PRN

## 2021-01-03 MED ORDER — OXYCODONE-ACETAMINOPHEN 5-325 MG PO TABS
1.0000 | ORAL_TABLET | Freq: Once | ORAL | Status: AC
Start: 2021-01-03 — End: 2021-01-03
  Administered 2021-01-03: 1 via ORAL
  Filled 2021-01-03: qty 1

## 2021-01-03 MED ORDER — ONDANSETRON 4 MG PO TBDP
4.0000 mg | ORAL_TABLET | Freq: Once | ORAL | Status: AC
  Administered 2021-01-03: 4 mg via ORAL
  Filled 2021-01-03: qty 1

## 2021-01-03 NOTE — Progress Notes (Signed)
Subjective: 2 Days Post-Op Procedure(s) (LRB): SPINAL CORD STIMULATOR PLACEMENT (N/A) Patient reports pain as moderate.   Incisional pain. No leg pain.  Tolerating PO without N/V. +void +flatus, +BM +ambulation Denies CP, SOB, calf pain, sweats/chills.  Objective: Vital signs in last 24 hours: Temp:  [98 F (36.7 C)-98.9 F (37.2 C)] 98.2 F (36.8 C) (09/30 0306) Pulse Rate:  [84-97] 86 (09/30 0306) Resp:  [18] 18 (09/30 0306) BP: (101-155)/(47-72) 155/72 (09/30 0306) SpO2:  [92 %-97 %] 97 % (09/30 0306)  Intake/Output from previous day: No intake/output data recorded. Intake/Output this shift: No intake/output data recorded.  No results for input(s): HGB in the last 72 hours. No results for input(s): WBC, RBC, HCT, PLT in the last 72 hours. No results for input(s): NA, K, CL, CO2, BUN, CREATININE, GLUCOSE, CALCIUM in the last 72 hours. Recent Labs    01/01/21 0600  INR 0.9    Neurologically intact ABD soft Neurovascular intact Sensation intact distally Intact pulses distally Dorsiflexion/Plantar flexion intact Incision: scant drainage Compartment soft   Assessment/Plan: 2 Days Post-Op Procedure(s) (LRB): SPINAL CORD STIMULATOR PLACEMENT (N/A) Advance diet Up with therapy Encourage IS DVT ppx: Teds, SCDs, ambulation Plan D/C home today.   Follow-up with Dr. Rolena Infante in 2 weeks.     Charlyne Petrin 01/03/2021, 7:21 AM

## 2021-01-03 NOTE — Discharge Summary (Signed)
Patient ID: RIMA BLIZZARD MRN: [id number] DOB/AGE: 12-Jul-1945 75 y.o.  Admit date: 01/01/2021 Discharge date: 01/03/2021  Admission Diagnoses:  Active Problems:   Chronic pain   Discharge Diagnoses:  Active Problems:   Chronic pain  status post Procedure(s): SPINAL CORD STIMULATOR PLACEMENT  Past Medical History:  Diagnosis Date   Allergy    seasonal   Arthritis    [place]    Colon polyps    Complication of anesthesia    per pt, she woke up in the middle of last colon in 2014.   Depression    Diabetes mellitus, type 2 ([place]) 06/2010   Diabetic neuropathy ([place])    Diverticulosis    pt unaware   Esophageal stricture    Fibromyalgia    [place]    GERD (gastroesophageal reflux disease)    Hemorrhoids    Hiatal hernia    Hyperlipidemia    Hypertension    Menopause    Numbness    Peripheral vascular insufficiency ([facility])    Retinopathy    Bilateral   TIA (transient ischemic attack)    Tremor    Vitamin D deficiency     Surgeries: Procedure(s): SPINAL CORD STIMULATOR PLACEMENT on 01/01/2021   Consultants:   Discharged Condition: Improved  Hospital Course: RAMANI RIVA is an 75 y.o. 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 Patient failed conservative treatments (please see the history and physical for the specifics) and had severe unremitting pain that affects sleep, daily activities and work/hobbies. After pre-op clearance, the patient was taken to the operating room on 01/01/2021 and underwent  Procedure(s): [place].    Patient was given perioperative antibiotics:  Anti-infectives (From admission, onward)    Start     Dose/Rate Route Frequency Ordered Stop   01/01/21 1800  ceFAZolin (ANCEF) IVPB 2g/100 mL premix        2 g 200 mL/hr over 30 Minutes Intravenous  Once 01/01/21 1150 01/01/21 1812   01/01/21 0549  vancomycin (VANCOCIN) IVPB 1000 mg/200 mL  premix        1,000 mg 200 mL/hr over 60 Minutes Intravenous 60 min pre-op 01/01/21 0549 01/01/21 0710        Patient was given sequential compression devices and early ambulation to prevent DVT.   Patient benefited maximally from hospital stay and there were no complications. At the time of discharge, the patient was urinating/moving their bowels without difficulty, tolerating a regular diet, pain is controlled with oral pain medications and they have been cleared by PT/OT.   Recent vital signs: Patient Vitals for the past 24 hrs:  BP Temp Temp src Pulse Resp SpO2  01/03/21 0747 (!) 143/70 98.3 F (36.8 C) Oral 86 18 94 %  01/03/21 0306 (!) 155/72 98.2 F (36.8 C) Oral 86 18 97 %  01/02/21 2252 120/60 98.3 F (36.8 C) Oral 84 18 93 %  01/02/21 1921 136/63 98.9 F (37.2 C) Oral 97 18 96 %     Recent laboratory studies:  Recent Labs    01/01/21 0600  INR 0.9     Discharge Medications:   Allergies as of 01/03/2021       Reactions   Penicillins Other (See Comments)   Blisters in mouth Did it involve swelling of the face/tongue/throat, SOB, or low BP? No Did it involve sudden or severe rash/hives, skin peeling, or any reaction on the inside of your mouth or nose? No  Did you need to seek medical attention at a hospital or doctor's office? No When did it last happen?      Childhood allergy If all above answers are "NO", may proceed with cephalosporin use.   Codeine Nausea And Vomiting   Erythromycin Nausea And Vomiting   Fenofibrate Other (See Comments)   aching & edema    Lyrica [pregabalin] Other (See Comments)   Edema in feet   Statins Other (See Comments)   Joints ache   Sulfa Antibiotics Nausea And Vomiting        Medication List     STOP taking these medications    Alpha-Lipoic Acid 600 MG Tabs   Aspercreme Lidocaine 4 % Liqd Generic drug: Lidocaine HCl       TAKE these medications    ALPRAZolam 0.5 MG tablet Commonly known as: XANAX Take 1  tablet (0.5 mg total) by mouth daily as needed for anxiety. Take 1-2 tabs daily as needed   ARIPiprazole 2 MG tablet Commonly known as: ABILIFY Take 1 tablet (2 mg total) by mouth daily. What changed:  how much to take when to take this   atorvastatin 10 MG tablet Commonly known as: LIPITOR Take 1 tablet (10 mg total) by mouth daily.   blood glucose meter kit and supplies Kit Dispense based on patient and insurance preference. Use up to two times daily as directed. (Dx: type 2 DM - E11.9)   clopidogrel 75 MG tablet Commonly known as: PLAVIX Take 1 tablet (75 mg total) by mouth daily.   escitalopram 20 MG tablet Commonly known as: LEXAPRO Take 1 tablet (20 mg total) by mouth daily.   ezetimibe 10 MG tablet Commonly known as: ZETIA Take 1 tablet (10 mg total) by mouth daily.   Fish Oil 1000 MG Caps Take 1,000 mg by mouth daily.   gabapentin 600 MG tablet Commonly known as: NEURONTIN Take 1 tablet (600 mg total) by mouth in the morning, at noon, in the evening, and at bedtime.   glipiZIDE 5 MG 24 hr tablet Commonly known as: GLUCOTROL XL Take 1 tablet (5 mg total) by mouth daily with breakfast.   HYDROcodone-acetaminophen 5-325 MG tablet Commonly known as: Norco Take 1 tablet by mouth every 6 (six) hours as needed for up to 5 days. What changed: reasons to take this   lisinopril-hydrochlorothiazide 20-25 MG tablet Commonly known as: ZESTORETIC Take 1 tablet by mouth daily.   meclizine 25 MG tablet Commonly known as: ANTIVERT Take 1 tablet (25 mg total) by mouth 3 (three) times daily as needed for dizziness.   nicotine polacrilex 2 MG gum Commonly known as: NICORETTE Take 2 mg by mouth as needed.   ondansetron 4 MG tablet Commonly known as: Zofran Take 1 tablet (4 mg total) by mouth every 8 (eight) hours as needed for nausea or vomiting.   ONE TOUCH ULTRA 2 w/Device Kit CHECK BLOOD SUGAR UP TO TWICE DAILY Dx G90.2   OneTouch Delica Lancets 11D Misc CHECK  BLOOD SUGAR UP TO TWICE DAILY Dx E11.9   OneTouch Ultra test strip Generic drug: glucose blood Check BS up to 2 times daily Dx 11.43   Ozempic (1 MG/DOSE) 4 MG/3ML Sopn Generic drug: Semaglutide (1 MG/DOSE) Inject 1 mg into the skin once a week.   pantoprazole 40 MG tablet Commonly known as: PROTONIX Take 1 tablet (40 mg total) by mouth daily.   Premarin vaginal cream Generic drug: conjugated estrogens Place vaginally at bedtime. What changed:  how much  to take when to take this reasons to take this   Synjardy XR 25-1000 MG Tb24 Generic drug: Empagliflozin-metFORMIN HCl ER Take 1 tablet by mouth daily.   Vitamin D 50 MCG (2000 UT) tablet Take 2,000 Units by mouth daily.        Diagnostic Studies: DG THORACOLUMABAR SPINE  Result Date: 01/01/2021 CLINICAL DATA:  Spinal cord stimulator placement EXAM: THORACOLUMBAR SPINE 1V COMPARISON:  None. FINDINGS: Two intraoperative spot images demonstrate placement of spinal cord stimulator in the lower thoracic region. IMPRESSION: As above. Electronically Signed   By: Rolm Baptise M.D.   On: 01/01/2021 12:58   DG C-Arm 1-60 Min-No Report  Result Date: 01/01/2021 Fluoroscopy was utilized by the requesting physician.  No radiographic interpretation.   DG C-Arm 1-60 Min-No Report  Result Date: 01/01/2021 Fluoroscopy was utilized by the requesting physician.  No radiographic interpretation.    Discharge Instructions     Incentive spirometry RT   Complete by: As directed         Follow-up Information     Melina Schools, MD Follow up in 2 week(s).   Specialty: Orthopedic Surgery Why: If symptoms worsen, For suture removal, For wound re-check Contact information: [address] [place] [address] [phone]                 Discharge Plan:  discharge to home  Disposition: stable    Signed: Charlyne Petrin for [facility] PA-C [facility] [phone] 01/03/2021, 4:41 PM

## 2021-01-03 NOTE — ED Triage Notes (Signed)
The pt had a spine stimulator placed by dr brooks on Wednesday  she has had severe pain since the incertion of the stimulator

## 2021-01-03 NOTE — Telephone Encounter (Signed)
Pt.'s husband reports pt. Had a spinal "procedure today and she is having pain. It's too early for her pain pill." States he called [facility] and left a message. Has not heard back. Instructed to call again. Will go to ED for worsening of pain.  Reason for Disposition  [1] SEVERE back pain (e.g., excruciating, unable to do any normal activities) AND [2] not improved 2 hours after pain medicine  Answer Assessment - Initial Assessment Questions 1. ONSET: "When did the pain begin?"      Today 2. LOCATION: "Where does it hurt?" (upper, mid or lower back)     Middle of back 3. SEVERITY: "How bad is the pain?"  (e.g., Scale 1-10; mild, moderate, or severe)   - MILD (1-3): doesn't interfere with normal activities    - MODERATE (4-7): interferes with normal activities or awakens from sleep    - SEVERE (8-10): excruciating pain, unable to do any normal activities      Moderate 4. PATTERN: "Is the pain constant?" (e.g., yes, no; constant, intermittent)      Comes and goes 5. RADIATION: "Does the pain shoot into your legs or elsewhere?"     No 6. CAUSE:  "What do you think is causing the back pain?"      Had a procedure today 7. BACK OVERUSE:  "Any recent lifting of heavy objects, strenuous work or exercise?"     No 8. MEDICATIONS: "What have you taken so far for the pain?" (e.g., nothing, acetaminophen, NSAIDS)     Hydrocodone 9. NEUROLOGIC SYMPTOMS: "Do you have any weakness, numbness, or problems with bowel/bladder control?"     No 10. OTHER SYMPTOMS: "Do you have any other symptoms?" (e.g., fever, abdominal pain, burning with urination, blood in urine)       No 11. PREGNANCY: "Is there any chance you are pregnant?" (e.g., yes, no; LMP)       No  Protocols used: Back Pain-A-AH

## 2021-01-03 NOTE — Progress Notes (Signed)
Physical Therapy Treatment Patient Details Name: [name] MRN: [id number] DOB: [birth year] Today's Date: 01/03/2021   History of Present Illness 75 y.o. female presents to [facility] hospital on 01/01/2021 with chronic debilitating back pain. Pt underwent successful spinal cord stimulator trial prior to admission. Pt underwent permanent spinal cord stimulator placement on 01/01/2021. PMH includes DMII, ataxia, depression, GERD, DDD.    PT Comments    Pt remains limited by reports of back discomfort and nausea. Pt is able to ambulate for household distances at this time, preferring to utilize UE support of counter surface in hallway when available. PT reinforces the need for frequent mobility, ideally hourly, to reduce stiffness and pain in low back. Pt will benefit from continued acute therapies to aide in a return to independence. PT recommends discharge home.   Recommendations for follow up therapy are one component of a multi-disciplinary discharge planning process, led by the attending physician.  Recommendations may be updated based on patient status, additional functional criteria and insurance authorization.  Follow Up Recommendations  No PT follow up     Equipment Recommendations  None recommended by PT    Recommendations for Other Services       Precautions / Restrictions Precautions Precautions: Fall;Back Precaution Booklet Issued: Yes (comment) Precaution Comments: no brace needed Restrictions Weight Bearing Restrictions: No     Mobility  Bed Mobility Overal bed mobility: Needs Assistance Bed Mobility: Supine to Sit;Sit to Supine     Supine to sit: Supervision Sit to supine: Supervision        Transfers Overall transfer level: Needs assistance Equipment used: None Transfers: Sit to/from Stand Sit to Stand: Supervision            Ambulation/Gait Ambulation/Gait assistance: Min guard Gait Distance (Feet): 200 Feet Assistive device: 1 person hand held assist  (PRN use of wall for support) Gait Pattern/deviations: Step-through pattern Gait velocity: reduced Gait velocity interpretation: 1.31 - 2.62 ft/sec, indicative of limited community ambulator General Gait Details: pt with slowed step-through gait, pt electing to utilize UE support of counter surface in hallway when available   Stairs             Wheelchair Mobility    Modified Rankin (Stroke Patients Only)       Balance Overall balance assessment: Needs assistance Sitting-balance support: No upper extremity supported;Feet supported Sitting balance-Leahy Scale: Good     Standing balance support: No upper extremity supported Standing balance-Leahy Scale: Fair                              Cognition Arousal/Alertness: Awake/alert Behavior During Therapy: WFL for tasks assessed/performed Overall Cognitive Status: Within Functional Limits for tasks assessed                                        Exercises      General Comments General comments (skin integrity, edema, etc.): VSS on RA      Pertinent Vitals/Pain Pain Assessment: Faces Faces Pain Scale: Hurts even more Pain Location: back Pain Descriptors / Indicators: Grimacing Pain Intervention(s): Monitored during session    Home Living                      Prior Function            PT Goals (current goals can now  be found in the care plan section) Acute Rehab PT Goals Patient Stated Goal: to reduce pain Progress towards PT goals: Progressing toward goals    Frequency    Min 5X/week      PT Plan Current plan remains appropriate    Co-evaluation              AM-PAC PT "6 Clicks" Mobility   Outcome Measure  Help needed turning from your back to your side while in a flat bed without using bedrails?: A Little Help needed moving from lying on your back to sitting on the side of a flat bed without using bedrails?: A Little Help needed moving to and from a  bed to a chair (including a wheelchair)?: A Little Help needed standing up from a chair using your arms (e.g., wheelchair or bedside chair)?: A Little Help needed to walk in hospital room?: A Little Help needed climbing 3-5 steps with a railing? : A Little 6 Click Score: 18    End of Session   Activity Tolerance: Patient limited by pain Patient left: in bed;with call bell/phone within reach;with family/visitor present Nurse Communication: Mobility status PT Visit Diagnosis: Other abnormalities of gait and mobility (R26.89);Pain Pain - part of body:  (back)     Time: 1062-6948 PT Time Calculation (min) (ACUTE ONLY): 8 min  Charges:  $Gait Training: 8-22 mins                     Zenaida Niece, PT, DPT Acute Rehabilitation Pager: [phone]    Zenaida Niece 01/03/2021, 9:34 AM

## 2021-01-03 NOTE — Plan of Care (Signed)
Patient alert and oriented, mae's well, voiding adequate amount of urine, swallowing without difficulty, no c/o pain at time of discharge. Patient discharged home with family. Script and discharged instructions given to patient. Patient and family stated understanding of instructions given. Patient has an appointment with Dr. Brooks in 2 weeks 

## 2021-01-04 ENCOUNTER — Emergency Department (HOSPITAL_COMMUNITY): Payer: Medicare Other

## 2021-01-04 DIAGNOSIS — R109 Unspecified abdominal pain: Secondary | ICD-10-CM | POA: Diagnosis not present

## 2021-01-04 DIAGNOSIS — I10 Essential (primary) hypertension: Secondary | ICD-10-CM | POA: Diagnosis not present

## 2021-01-04 DIAGNOSIS — Z90710 Acquired absence of both cervix and uterus: Secondary | ICD-10-CM | POA: Diagnosis not present

## 2021-01-04 DIAGNOSIS — N2889 Other specified disorders of kidney and ureter: Secondary | ICD-10-CM | POA: Diagnosis not present

## 2021-01-04 DIAGNOSIS — R590 Localized enlarged lymph nodes: Secondary | ICD-10-CM | POA: Diagnosis not present

## 2021-01-04 DIAGNOSIS — R1010 Upper abdominal pain, unspecified: Secondary | ICD-10-CM | POA: Diagnosis not present

## 2021-01-04 MED ORDER — POLYETHYLENE GLYCOL 3350 17 G PO PACK: 17.0000 g | PACK | Freq: Two times a day (BID) | ORAL | 0 refills | Status: AC | PRN

## 2021-01-04 MED ORDER — SODIUM CHLORIDE 0.9 % IV BOLUS
1000.0000 mL | Freq: Once | INTRAVENOUS | Status: AC
  Administered 2021-01-04: 1000 mL via INTRAVENOUS

## 2021-01-04 MED ORDER — IOHEXOL 350 MG/ML SOLN
75.0000 mL | Freq: Once | INTRAVENOUS | Status: AC | PRN
  Administered 2021-01-04: 75 mL via INTRAVENOUS

## 2021-01-04 MED ORDER — METHOCARBAMOL 500 MG PO TABS
500.0000 mg | ORAL_TABLET | Freq: Once | ORAL | Status: AC
  Administered 2021-01-04: 500 mg via ORAL
  Filled 2021-01-04: qty 1

## 2021-01-04 MED ORDER — HYDROMORPHONE HCL 1 MG/ML IJ SOLN
1.0000 mg | Freq: Once | INTRAMUSCULAR | Status: AC
  Administered 2021-01-04: 1 mg via INTRAVENOUS
  Filled 2021-01-04: qty 1

## 2021-01-04 MED ORDER — METHOCARBAMOL 500 MG PO TABS: 500.0000 mg | ORAL_TABLET | Freq: Two times a day (BID) | ORAL | 0 refills | Status: DC | PRN

## 2021-01-04 MED ORDER — DOCUSATE SODIUM 100 MG PO CAPS: 100.0000 mg | ORAL_CAPSULE | Freq: Two times a day (BID) | ORAL | 0 refills | Status: DC

## 2021-01-04 MED ORDER — ONDANSETRON HCL 4 MG/2ML IJ SOLN
4.0000 mg | Freq: Once | INTRAMUSCULAR | Status: AC | PRN
  Administered 2021-01-04: 4 mg via INTRAVENOUS
  Filled 2021-01-04: qty 2

## 2021-01-04 MED ORDER — HYDROMORPHONE HCL 1 MG/ML IJ SOLN
1.0000 mg | Freq: Once | INTRAMUSCULAR | Status: AC
Start: 2021-01-04 — End: 2021-01-04
  Administered 2021-01-04: 1 mg via INTRAVENOUS
  Filled 2021-01-04: qty 1

## 2021-01-04 MED ORDER — KETAMINE HCL 50 MG/5ML IJ SOSY
0.3000 mg/kg | PREFILLED_SYRINGE | Freq: Once | INTRAMUSCULAR | Status: AC
  Administered 2021-01-04: 28 mg via INTRAVENOUS
  Filled 2021-01-04: qty 5

## 2021-01-04 NOTE — ED Notes (Signed)
Patient transported to X-ray with RN

## 2021-01-04 NOTE — Discharge Instructions (Addendum)
If you develop worsening, continued, or recurrent abdominal pain, uncontrolled vomiting, fever, chest or back pain, or any other new/concerning symptoms then return to the ER for evaluation.  

## 2021-01-04 NOTE — ED Notes (Signed)
Patient transported to CT with RN 

## 2021-01-04 NOTE — ED Provider Notes (Signed)
[facility] EMERGENCY DEPARTMENT Provider Note   CSN: [id number] Arrival date & time: 01/03/21  2111     History Chief Complaint  Patient presents with   Back Pain    [name] is a 75 y.o. female.  HPI 75 year old female presents with severe pain.  Patient states that she had a spinal cord stimulator placed earlier this week.  Had to stay a couple nights in the hospital due to pain as well as not having a bowel movement.  Finally had a bowel movement on 9/30 in the morning and was discharged.  She has been having progressively worsening flank/abdominal pain since.  Is nauseous but cannot vomit.  Minimal gas.  She states the bowel movement she had was pretty small.  She is having pain in her back where she had the surgery but is nothing like the pain in the front.  No incontinence or weakness/numbness in her extremities.  Past Medical History:  Diagnosis Date   Allergy    seasonal   Arthritis    [place]    Colon polyps    Complication of anesthesia    per pt, she woke up in the middle of last colon in 2014.   Depression    Diabetes mellitus, type 2 ([place]) 06/2010   Diabetic neuropathy ([place])    Diverticulosis    pt unaware   Esophageal stricture    Fibromyalgia    [place]    GERD (gastroesophageal reflux disease)    Hemorrhoids    Hiatal hernia    Hyperlipidemia    Hypertension    Menopause    Numbness    Peripheral vascular insufficiency (HCC)    Retinopathy    Bilateral   TIA (transient ischemic attack)    Tremor    Vitamin D deficiency     Patient Active Problem List   Diagnosis Date Noted   Chronic pain 01/01/2021   Paresthesia 06/14/2019   Diabetic autonomic neuropathy associated with type 2 diabetes mellitus ([place]) 06/14/2019   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   Aortic atherosclerosis ([place]) 12/08/2017   Hardening of the aorta (main artery of the heart) ([place]) 12/08/2017   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   Depression 02/28/2013   GERD (gastroesophageal reflux disease) 02/28/2013   Hiatal hernia with gastroesophageal reflux 02/28/2013   DDD (degenerative disc disease), lumbosacral 06/23/2012   Fibromyalgia syndrome 06/23/2012   Essential hypertension, benign 06/23/2012   Osteopenia 06/23/2012   Diabetes ([place]) 06/23/2012   Obesity due to excess calories 10/29/2010   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   PERSONAL HISTORY OF COLONIC POLYPS 10/11/2009    Past Surgical History:  Procedure Laterality Date   ABDOMINAL HYSTERECTOMY     partial and then total   ANKLE SURGERY Right    COLONOSCOPY     ELBOW SURGERY     left   KNEE ARTHROSCOPY Right 09/12/2018   Procedure: ARTHROSCOPY KNEE; SYNOVECTOMY;  Surgeon: Gaynelle Arabian, MD;  Location: [facility];  Service: Orthopedics;  Laterality: Right;  31mn   REFRACTIVE SURGERY  2003   SPINAL CORD STIMULATOR INSERTION N/A 01/01/2021   Procedure: SPINAL CORD STIMULATOR PLACEMENT;  Surgeon: BMelina Schools MD;  Location: [facility]  Service: Orthopedics;  Laterality: N/A;   TOTAL KNEE ARTHROPLASTY     bilateral   TOTAL KNEE REVISION Right 03/09/2018   Procedure: RIGHT TOTAL KNEE REVISION;  Surgeon: AGaynelle Arabian MD;  Location: [facility];  Service: Orthopedics;  Laterality: Right;  12105m with abductor  block   VESICOVAGINAL FISTULA CLOSURE W/ TAH  1981     OB History   No obstetric history on file.     Family History  Problem Relation Age of Onset   Diabetes Mother    Heart failure Mother    Hypertension Mother    Cirrhosis Father    Cirrhosis Brother     Social History   Tobacco Use   Smoking status: Former    Packs/day: 1.00    Years: 10.00    Pack years: 10.00    Types: Cigarettes    Start date: 11/07/1975    Quit date: 06/24/2010    Years since quitting: 10.5   Smokeless tobacco: Never  Vaping Use   Vaping Use: Never used  Substance Use Topics   Alcohol use: Not Currently   Drug use: No    Home  Medications Prior to Admission medications   Medication Sig Start Date End Date Taking? Authorizing Provider  ALPRAZolam Duanne Moron) 0.5 MG tablet Take 1 tablet (0.5 mg total) by mouth daily as needed for anxiety. Take 1-2 tabs daily as needed Patient taking differently: Take 0.5-1 mg by mouth daily as needed for anxiety. 12/30/20  Yes Dettinger, Fransisca Kaufmann, MD  ARIPiprazole (ABILIFY) 2 MG tablet Take 1 tablet (2 mg total) by mouth daily. Patient taking differently: Take 1 mg by mouth at bedtime. 06/24/20  Yes Dettinger, Fransisca Kaufmann, MD  atorvastatin (LIPITOR) 10 MG tablet Take 1 tablet (10 mg total) by mouth daily. 06/24/20  Yes Dettinger, Fransisca Kaufmann, MD  blood glucose meter kit and supplies KIT Dispense based on patient and insurance preference. Use up to two times daily as directed. (Dx: type 2 DM - E11.9) 04/30/16  Yes Chipper Herb, MD  Blood Glucose Monitoring Suppl (ONE TOUCH ULTRA 2) w/Device KIT CHECK BLOOD SUGAR UP TO TWICE DAILY Dx E11.9 03/25/20  Yes Dettinger, Fransisca Kaufmann, MD  Cholecalciferol (VITAMIN D) 50 MCG (2000 UT) tablet Take 2,000 Units by mouth daily.    Yes [provider]  clopidogrel (PLAVIX) 75 MG tablet Take 1 tablet (75 mg total) by mouth daily. 12/30/20  Yes Dettinger, Fransisca Kaufmann, MD  conjugated estrogens (PREMARIN) vaginal cream Place vaginally at bedtime. Patient taking differently: Place 1 applicator vaginally daily as needed (dryness). 06/24/20  Yes Dettinger, Fransisca Kaufmann, MD  docusate sodium (COLACE) 100 MG capsule Take 1 capsule (100 mg total) by mouth every 12 (twelve) hours. 01/04/21  Yes Sherwood Gambler, MD  Empagliflozin-metFORMIN HCl ER (SYNJARDY XR) 25-1000 MG TB24 Take 1 tablet by mouth daily. 06/24/20  Yes Dettinger, Fransisca Kaufmann, MD  escitalopram (LEXAPRO) 20 MG tablet Take 1 tablet (20 mg total) by mouth daily. 06/24/20  Yes Dettinger, Fransisca Kaufmann, MD  ezetimibe (ZETIA) 10 MG tablet Take 1 tablet (10 mg total) by mouth daily. 12/30/20  Yes Dettinger, Fransisca Kaufmann, MD  gabapentin  (NEURONTIN) 600 MG tablet Take 1 tablet (600 mg total) by mouth in the morning, at noon, in the evening, and at bedtime. 06/24/20  Yes Dettinger, Fransisca Kaufmann, MD  glipiZIDE (GLUCOTROL XL) 5 MG 24 hr tablet Take 1 tablet (5 mg total) by mouth daily with breakfast. 06/24/20  Yes Dettinger, Fransisca Kaufmann, MD  glucose blood (ONETOUCH ULTRA) test strip Check BS up to 2 times daily Dx 11.43 10/11/19  Yes Dettinger, Fransisca Kaufmann, MD  HYDROcodone-acetaminophen (NORCO) 5-325 MG tablet Take 1 tablet by mouth every 6 (six) hours as needed for up to 5 days. 01/01/21 01/06/21 Yes Melina Schools,  MD  lisinopril-hydrochlorothiazide (ZESTORETIC) 20-25 MG tablet Take 1 tablet by mouth daily. 06/24/20  Yes Dettinger, Fransisca Kaufmann, MD  meclizine (ANTIVERT) 25 MG tablet Take 1 tablet (25 mg total) by mouth 3 (three) times daily as needed for dizziness. 12/30/20  Yes Dettinger, Fransisca Kaufmann, MD  methocarbamol (ROBAXIN) 500 MG tablet Take 1 tablet (500 mg total) by mouth 2 (two) times daily as needed for muscle spasms. 01/04/21  Yes Sherwood Gambler, MD  nicotine polacrilex (NICORETTE) 2 MG gum Take 2 mg by mouth as needed for smoking cessation.   Yes [provider]  Omega-3 Fatty Acids (FISH OIL) 1000 MG CAPS Take 1,000 mg by mouth daily.   Yes [provider]  ondansetron (ZOFRAN) 4 MG tablet Take 1 tablet (4 mg total) by mouth every 8 (eight) hours as needed for nausea or vomiting. 01/01/21  Yes Melina Schools, MD  OneTouch Delica Lancets 11H MISC CHECK BLOOD SUGAR UP TO TWICE DAILY Dx E11.9 10/11/19  Yes Dettinger, Fransisca Kaufmann, MD  pantoprazole (PROTONIX) 40 MG tablet Take 1 tablet (40 mg total) by mouth daily. 12/30/20  Yes Dettinger, Fransisca Kaufmann, MD  polyethylene glycol (MIRALAX / GLYCOLAX) 17 g packet Take 17 g by mouth 2 (two) times daily as needed (constipation). 01/04/21  Yes Sherwood Gambler, MD  Semaglutide, 1 MG/DOSE, (OZEMPIC, 1 MG/DOSE,) 4 MG/3ML SOPN Inject 1 mg into the skin once a week. Patient taking differently: Inject 1 mg  into the skin every Sunday. 12/30/20  Yes Dettinger, Fransisca Kaufmann, MD    Allergies    Penicillins, Codeine, Erythromycin, Fenofibrate, Lyrica [pregabalin], Statins, and Sulfa antibiotics  Review of Systems   Review of Systems  Constitutional:  Negative for fever.  Gastrointestinal:  Positive for abdominal pain, constipation and nausea. Negative for vomiting.  Musculoskeletal:  Positive for back pain.  Neurological:  Negative for weakness and numbness.  All other systems reviewed and are negative.  Physical Exam Updated Vital Signs BP (!) 153/76   Pulse 88   Temp 97.8 F (36.6 C) (Oral)   Resp 16   Ht 5' 6"  (1.676 m)   Wt 92.1 kg   SpO2 97%   BMI 32.77 kg/m   Physical Exam Vitals and nursing note reviewed.  Constitutional:      Appearance: She is well-developed.  HENT:     Head: Normocephalic and atraumatic.     Right Ear: External ear normal.     Left Ear: External ear normal.     Nose: Nose normal.  Eyes:     General:        Right eye: No discharge.        Left eye: No discharge.  Cardiovascular:     Rate and Rhythm: Normal rate and regular rhythm.     Heart sounds: Normal heart sounds.  Pulmonary:     Effort: Pulmonary effort is normal.     Breath sounds: Normal breath sounds.  Abdominal:     Palpations: Abdomen is soft.     Tenderness: There is abdominal tenderness.     Comments: Generalized  Musculoskeletal:     Comments: There is some mild tenderness to her midline thoracic or lumbar back where she had the surgery but no swelling or significant tenderness.  Skin:    General: Skin is warm and dry.  Neurological:     Mental Status: She is alert.     Comments: Normal strength/sensation in BLE  Psychiatric:        Mood and Affect:  Mood is not anxious.    ED Results / Procedures / Treatments   Labs (all labs ordered are listed, but only abnormal results are displayed) Labs Reviewed  CBC WITH DIFFERENTIAL/PLATELET - Abnormal; Notable for the following  components:      Result Value   WBC 12.4 (*)    Hemoglobin 11.4 (*)    MCV 79.8 (*)    MCH 23.9 (*)    RDW 16.6 (*)    Neutro Abs 9.3 (*)    Monocytes Absolute 1.3 (*)    All other components within normal limits  COMPREHENSIVE METABOLIC PANEL - Abnormal; Notable for the following components:   Glucose, Bld 129 (*)    Albumin 3.1 (*)    All other components within normal limits  URINALYSIS, ROUTINE W REFLEX MICROSCOPIC - Abnormal; Notable for the following components:   Glucose, UA >=500 (*)    Ketones, ur 20 (*)    All other components within normal limits  LIPASE, BLOOD    EKG EKG Interpretation  Date/Time:  Saturday January 04 2021 06:45:28 EDT Ventricular Rate:  88 PR Interval:  162 QRS Duration: 107 QT Interval:  395 QTC Calculation: 478 R Axis:   32 Text Interpretation: Sinus rhythm Low voltage, precordial leads Abnormal R-wave progression, early transition similar to Jan 2021 Confirmed by Sherwood Gambler [phone]) on 01/04/2021 6:47:34 AM  Radiology DG Abd 1 View  Result Date: 01/04/2021 CLINICAL DATA:  Severe pain since spinal cord implant. EXAM: ABDOMEN - 1 VIEW COMPARISON:  Preceding abdominal CT FINDINGS: Spinal cord stimulator with leads over the lower thoracic spine. The wires appear continuous. On prior abdominal CT there is expected soft tissue gas and swelling at the level of implantation. Lumbar spine degeneration. Excreting pyelograms from prior CT. IMPRESSION: Recently placed spinal cord stimulator without complicating feature by preceding CT. Electronically Signed   By: Jorje Guild M.D.   On: 01/04/2021 04:59   CT ABDOMEN PELVIS W CONTRAST  Result Date: 01/04/2021 CLINICAL DATA:  Bowel obstruction suspected EXAM: CT ABDOMEN AND PELVIS WITH CONTRAST TECHNIQUE: Multidetector CT imaging of the abdomen and pelvis was performed using the standard protocol following bolus administration of intravenous contrast. CONTRAST:  59m OMNIPAQUE IOHEXOL 350 MG/ML SOLN  COMPARISON:  Abdominal MRI 03/01/2015 FINDINGS: Lower chest:  No contributory findings. Hepatobiliary: No focal liver abnormality.No evidence of biliary obstruction or stone. Pancreas: Unremarkable. Spleen: Unremarkable. Adrenals/Urinary Tract: Negative adrenals. No hydronephrosis or stone. Intermediate density right lower pole renal lesion measuring 15 mm, benign based on characterization by MRI in 2016. Unremarkable bladder. Stomach/Bowel: No obstruction. No visible bowel inflammation. No identifiable appendix. Vascular/Lymphatic: No acute vascular abnormality. Mildly enlarged lymph nodes along the right pelvic sidewall of doubtful significance in isolation. Reproductive:Hysterectomy. Other: No ascites or pneumoperitoneum. Musculoskeletal: No acute abnormalities. Lumbar spine degeneration with spinal cord stimulator. IMPRESSION: 1. Negative for bowel obstruction or visible inflammation. 2. Chronic findings are described above. Electronically Signed   By: JJorje GuildM.D.   On: 01/04/2021 04:54    Procedures Procedures   Medications Ordered in ED Medications  oxyCODONE-acetaminophen (PERCOCET/ROXICET) 5-325 MG per tablet 1 tablet (1 tablet Oral Given 01/03/21 2204)  ondansetron (ZOFRAN-ODT) disintegrating tablet 4 mg (4 mg Oral Given 01/03/21 2204)  sodium chloride 0.9 % bolus 1,000 mL (0 mLs Intravenous Stopped 01/04/21 0402)  ondansetron (ZOFRAN) injection 4 mg (4 mg Intravenous Given 01/04/21 0254)  HYDROmorphone (DILAUDID) injection 1 mg (1 mg Intravenous Given 01/04/21 0254)  HYDROmorphone (DILAUDID) injection 1 mg (1 mg Intravenous  Given 01/04/21 0351)  ketamine 50 mg in normal saline 5 mL (10 mg/mL) syringe (28 mg Intravenous Given 01/04/21 0431)  iohexol (OMNIPAQUE) 350 MG/ML injection 75 mL (75 mLs Intravenous Contrast Given 01/04/21 0448)  methocarbamol (ROBAXIN) tablet 500 mg (500 mg Oral Given 01/04/21 0532)    ED Course  I have reviewed the triage vital signs and the nursing  notes.  Pertinent labs & imaging results that were available during my care of the patient were reviewed by me and considered in my medical decision making (see chart for details).    MDM Rules/Calculators/A&P                           Patient presents with severe abdominal pain, mostly upper but pre much generalized and wrapping around to her back bilaterally.  However, the middle of her back where she had surgery is not affected.  No acute neuro complaints.  No chest pain/shortness of breath.  ECG is unremarkable.  Nonspecific lab findings with a minimal leukocytosis of unclear etiology.  It took multiple doses of pain medicine and ultimately pain dose ketamine to get her pain well enough that she could lay back for a CT scanner as the seem to induce more pain.  However her CT scan has been reviewed, both the radiology read and images and there is no acute finding.  There is a good amount of stool and perhaps that is related though I do not know if that is the whole cause.  Perhaps this is muscular given the positional complaint.  We will try muscle relaxer as well.  However at this point she is feeling well enough she would like to go home.  I highly doubt an acute spinal cord emergency I do not think further work-up is needed at this point.  We will have her follow-up with her spine surgeon. Given return precautions. Final Clinical Impression(s) / ED Diagnoses Final diagnoses:  Upper abdominal pain    Rx / DC Orders ED Discharge Orders          Ordered    methocarbamol (ROBAXIN) 500 MG tablet  2 times daily PRN        01/04/21 0638    polyethylene glycol (MIRALAX / GLYCOLAX) 17 g packet  2 times daily PRN        01/04/21 0638    docusate sodium (COLACE) 100 MG capsule  Every 12 hours        01/04/21 1586             Sherwood Gambler, MD 01/04/21 [id number]

## 2021-01-13 ENCOUNTER — Other Ambulatory Visit: Payer: Self-pay | Admitting: Family Medicine

## 2021-01-13 DIAGNOSIS — R42 Dizziness and giddiness: Secondary | ICD-10-CM

## 2021-02-19 ENCOUNTER — Ambulatory Visit: Payer: Medicare Other | Attending: Orthopedic Surgery | Admitting: Physical Therapy

## 2021-02-19 DIAGNOSIS — M545 Low back pain, unspecified: Secondary | ICD-10-CM | POA: Insufficient documentation

## 2021-02-19 DIAGNOSIS — G8929 Other chronic pain: Secondary | ICD-10-CM | POA: Insufficient documentation

## 2021-02-19 DIAGNOSIS — R2689 Other abnormalities of gait and mobility: Secondary | ICD-10-CM | POA: Insufficient documentation

## 2021-02-19 DIAGNOSIS — M6281 Muscle weakness (generalized): Secondary | ICD-10-CM | POA: Insufficient documentation

## 2021-02-21 ENCOUNTER — Ambulatory Visit: Payer: Medicare Other | Admitting: *Deleted

## 2021-02-25 DIAGNOSIS — G894 Chronic pain syndrome: Secondary | ICD-10-CM | POA: Diagnosis not present

## 2021-02-25 DIAGNOSIS — M48062 Spinal stenosis, lumbar region with neurogenic claudication: Secondary | ICD-10-CM | POA: Diagnosis not present

## 2021-02-25 DIAGNOSIS — M5136 Other intervertebral disc degeneration, lumbar region: Secondary | ICD-10-CM | POA: Diagnosis not present

## 2021-02-26 ENCOUNTER — Encounter: Payer: Self-pay | Admitting: Physical Therapy

## 2021-02-26 ENCOUNTER — Ambulatory Visit: Payer: Medicare Other | Admitting: Physical Therapy

## 2021-02-26 ENCOUNTER — Other Ambulatory Visit: Payer: Self-pay

## 2021-02-26 DIAGNOSIS — G8929 Other chronic pain: Secondary | ICD-10-CM | POA: Diagnosis not present

## 2021-02-26 DIAGNOSIS — R2689 Other abnormalities of gait and mobility: Secondary | ICD-10-CM | POA: Diagnosis not present

## 2021-02-26 DIAGNOSIS — M545 Low back pain, unspecified: Secondary | ICD-10-CM

## 2021-02-26 DIAGNOSIS — M6281 Muscle weakness (generalized): Secondary | ICD-10-CM | POA: Diagnosis not present

## 2021-02-26 NOTE — Patient Instructions (Signed)
[facility]   DRY NEEDLING CONSENT FORM   Trigger point dry needling is a physical therapy approach to treat Myofascial Pain and Dysfunction.  Dry Needling (DN) is a valuable and effective way to deactivate myofascial trigger points (muscle knots/pain). It is skilled intervention that uses a thin filiform needle to penetrate the skin and stimulate underlying myofascial trigger points, muscular, and connective tissues for the management of neuromusculoskeletal pain and movement impairments.  A local twitch response (LTR) will be elicited.  This can sometimes feel like a deep ache in the muscle during the procedure. Multiple trigger points in multiple muscles can be treated during each treatment.  No medication of any kind is injected.   As with any medical treatment and procedure, there are possible adverse events.  While significant adverse events are uncommon, they do sometimes occur and must be considered prior to giving consent.  Dry needling often causes a "post needling soreness".  There can be an increase in pain from a couple of hours to 2-3 days, followed by an improvement in the overall pain state. Any time a needle is used there is a risk of infection.  However, we are using new, sterile, and disposable needles; infections are extremely rare. There is a possibility that you may bleed or bruise.  You may feel tired and some nausea following treatment. There is a rare possibility of a pneumothorax (air in the chest cavity). Allergic reaction to nickel in the stainless steel needle. If a nerve is touched, it may cause paresthesia (a prickling/shock sensation) which is usually brief, but may continue for a couple of days.  Following treatment stay hydrated.  Continue regular activities but not too vigorous initially after treatment for 24-48 hours.  Dry Needling is best when combined with other physical therapy interventions such as strengthening, stretching  and other therapeutic modalities.   PLEASE ANSWER THE FOLLOWING QUESTIONS:  Do you have a lack of sensation?   Y/N  Do you have a phobia or fear of needles  Y/N  Are you pregnant?    Y/N If yes:  How many weeks? __________ Do you have any implanted devices?  Y/N If yes:  Pacemaker/Spinal Cord Stimulator/Deep Brain Stimulator/Insulin Pump/Other: ________________ Do you have any implants?  Y/N If yes: Breast/Facial/Pecs/Buttocks/Calves/Hip  Replacement/ Knee Replacement/Other: _________ Do you take any blood thinners?   Y/N If yes: Coumadin (Warfarin)/Other: ___________________ Do you have a bleeding disorder?   Y/N If yes: What kind: _________________________________ Do you take any immunosuppressants?  Y/N If yes:   What kind: _________________________________ Do you take anti-inflammatories?   Y/N If yes: What kind: Advil/Aspirin/Other: ________________ Have you ever been diagnosed with Scoliosis? Y/N Have you had back surgery?   Y/N If yes:  Laminectomy/Fusion/Other: ___________________   I have read, or had read to me, the above.  I have had the opportunity to ask any questions.  All of my questions have been answered to my satisfaction and I understand the risks involved with dry needling.  I consent to examination and treatment at [facility], including dry needling, of any and all of my involved and affected muscles.     Signature: __________________________________     Date:___________________________________________________     

## 2021-02-26 NOTE — Therapy (Signed)
[facility] [place], [place], [address] Phone: [phone]   Fax:  [phone]  Physical Therapy Evaluation  Patient Details  Name: [name] MRN: [id number] Date of Birth: 05/09/1945 Referring Provider (PT): Melina Schools MD   Encounter Date: 02/26/2021   PT End of Session - 02/26/21 1121     Visit Number 1    Number of Visits 12    Date for PT Re-Evaluation 04/09/21    PT Start Time 0948    PT Stop Time 1016    PT Time Calculation (min) 28 min    Activity Tolerance Patient tolerated treatment well    Behavior During Therapy [facility] for tasks assessed/performed             Past Medical History:  Diagnosis Date   Allergy    seasonal   Arthritis    [place]    Colon polyps    Complication of anesthesia    per pt, she woke up in the middle of last colon in 2014.   Depression    Diabetes mellitus, type 2 ([place]) 06/2010   Diabetic neuropathy ([place])    Diverticulosis    pt unaware   Esophageal stricture    Fibromyalgia    [place]    GERD (gastroesophageal reflux disease)    Hemorrhoids    Hiatal hernia    Hyperlipidemia    Hypertension    Menopause    Numbness    Peripheral vascular insufficiency (HCC)    Retinopathy    Bilateral   TIA (transient ischemic attack)    Tremor    Vitamin D deficiency     Past Surgical History:  Procedure Laterality Date   ABDOMINAL HYSTERECTOMY     partial and then total   ANKLE SURGERY Right    COLONOSCOPY     ELBOW SURGERY     left   KNEE ARTHROSCOPY Right 09/12/2018   Procedure: ARTHROSCOPY KNEE; SYNOVECTOMY;  Surgeon: Gaynelle Arabian, MD;  Location: [facility];  Service: Orthopedics;  Laterality: Right;  81min   REFRACTIVE SURGERY  2003   SPINAL CORD STIMULATOR INSERTION N/A 01/01/2021   Procedure: SPINAL CORD STIMULATOR PLACEMENT;  Surgeon: Melina Schools, MD;  Location: [place];  Service: Orthopedics;  Laterality: N/A;   TOTAL KNEE ARTHROPLASTY     bilateral    TOTAL KNEE REVISION Right 03/09/2018   Procedure: RIGHT TOTAL KNEE REVISION;  Surgeon: Gaynelle Arabian, MD;  [facility];  Service: Orthopedics;  Laterality: Right;  149min with abductor block   VESICOVAGINAL FISTULA CLOSURE W/ TAH  1981    There were no vitals filed for this visit.    Subjective Assessment - 02/26/21 1053     Subjective COVID-19 screen performed prior to patient entering clinic.  The patient presents to the clinic today with c/o bilateral low back pain.  She had a spinal core stimulator put in on 01/02/21 but at this time she has not found it to be highly effective.  She is hopeful, however, as there are multiple protocols that can be tried.  Her pain at rest is a 4/10 today but can rise to higher levels with increased standing and walking.  Sitting decreases her pain.    Pertinent History Bilateral TKA's, HTN, TIA, Fibromyalgia, DM.    How long can you sit comfortably? Unlimited.    How long can you stand comfortably? Varies.    How long can you walk comfortably? Short community distances.    Patient Stated  Goals Decrease pain and be able to walk and stand longer.    Currently in Pain? Yes    Pain Score 4     Pain Location Back    Pain Orientation Right;Left    Pain Descriptors / Indicators Throbbing    Pain Type Chronic pain    Pain Onset More than a month ago    Pain Frequency Constant    Aggravating Factors  See above.    Pain Relieving Factors See above.                [facility] PT Assessment - 02/26/21 0001       Assessment   Medical Diagnosis Low back pain.    Referring Provider (PT) Melina Schools MD    Onset Date/Surgical Date --   Ongoing.     Restrictions   Weight Bearing Restrictions No      Balance Screen   Has the patient fallen in the past 6 months No    Has the patient had a decrease in activity level because of a fear of falling?  Yes    Is the patient reluctant to leave their home because of a fear of falling?  No      Home  Environment   Living Environment Private residence      Prior Function   Level of Independence Independent      Posture/Postural Control   Posture Comments Bilateral knee flexion and patient standing in some trunk flexion.      ROM / Strength   AROM / PROM / Strength AROM;Strength      AROM   Overall AROM Comments Bilateral knee extension is -20 degress.  Bilateral hip flexion is 115 degrees.  Active lumbar flexion limited by 50% and active extension to 15 degrees.      Strength   Overall Strength Comments Bilateral hip abduction is 4 to 4+/5.  Bilateral knee strength is normal.  Hip bridge performed through 50% ROM.      Palpation   Palpation comment Patient c/o some bilateral lumbar erector spinae mucualture tenderness.  Her musculature is very taut to palapation. She has some tenderness over her lateral hip musculature as well.      Ambulation/Gait   Gait Comments The patient walks with bilateral knees and trunk in some flexion.                        Objective measurements completed on examination: See above findings.                     PT Long Term Goals - 02/26/21 1127       PT LONG TERM GOAL #1   Title Ind with an HEP.    Time 6    Period Weeks    Status New      PT LONG TERM GOAL #2   Title Stand 20 minutes with LBP level not > 4/10.    Time 6    Period Weeks    Status New      PT LONG TERM GOAL #3   Title Walk a community distance with pain not > 4/10.    Time 6    Period Weeks    Status New                    Plan - 02/26/21 1123     Clinical Impression Statement The patient pressents to OPPT with c/o  bilateral low back pain.  She states her doctor believes that som eof this pain may be coming from her spinal musculature.  We discussed focusing on this and treatments include dry needling.  She cannot walk and stand for long periods of time due to pain.  Her lumbar musculature is very taut to palpation and she  has some tenderness over her bilateral hip musculature.  Patient will benefit from skilled physical therapy intervention to address pain and deficits.    Personal Factors and Comorbidities Comorbidity 1;Other    Comorbidities Bilateral TKA's, HTN, TIA, Fibromyalgia, DM.    Examination-Activity Limitations Locomotion Level;Stand    Examination-Participation Restrictions Other;Meal Prep;Cleaning    Stability/Clinical Decision Making Evolving/Moderate complexity    Clinical Decision Making Low    Rehab Potential Good    PT Frequency 2x / week    PT Duration 6 weeks    PT Treatment/Interventions ADLs/Self Care Home Management;Moist Heat;Therapeutic activities;Therapeutic exercise;Manual techniques;Patient/family education;Passive range of motion;Dry needling    PT Next Visit Plan STW/M and dry needling to patient's bilateral lumbar and lateral hip musculature.    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   Aortic atherosclerosis ([place]) 12/08/2017   Hardening of the aorta (main artery of the heart) ([place]) 12/08/2017   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[place]) 01/14/2015   Depression 02/28/2013   GERD (gastroesophageal reflux disease) 02/28/2013   Hiatal hernia with gastroesophageal reflux 02/28/2013   DDD (degenerative disc disease), lumbosacral 06/23/2012   Fibromyalgia syndrome 06/23/2012   Essential  hypertension, benign 06/23/2012   Osteopenia 06/23/2012   Diabetes ([place]) 06/23/2012   Obesity due to excess calories 10/29/2010   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   PERSONAL HISTORY OF COLONIC POLYPS 10/11/2009    Olander Friedl, [place], PT 02/26/2021, 11:29 AM  [facility] [address] [place], [place], [address] Phone: [phone]   Fax:  [phone]  Name: [name] MRN: [id number] Date of Birth: 09-Nov-1945

## 2021-03-04 ENCOUNTER — Ambulatory Visit: Payer: Medicare Other | Admitting: *Deleted

## 2021-03-06 ENCOUNTER — Encounter: Payer: Medicare Other | Admitting: *Deleted

## 2021-03-11 ENCOUNTER — Other Ambulatory Visit: Payer: Self-pay

## 2021-03-11 ENCOUNTER — Ambulatory Visit: Payer: Medicare Other | Attending: Orthopedic Surgery | Admitting: Physical Therapy

## 2021-03-11 ENCOUNTER — Encounter: Payer: Self-pay | Admitting: Physical Therapy

## 2021-03-11 DIAGNOSIS — G8929 Other chronic pain: Secondary | ICD-10-CM | POA: Insufficient documentation

## 2021-03-11 DIAGNOSIS — M25661 Stiffness of right knee, not elsewhere classified: Secondary | ICD-10-CM | POA: Diagnosis not present

## 2021-03-11 DIAGNOSIS — M545 Low back pain, unspecified: Secondary | ICD-10-CM | POA: Insufficient documentation

## 2021-03-11 DIAGNOSIS — M25561 Pain in right knee: Secondary | ICD-10-CM | POA: Insufficient documentation

## 2021-03-11 NOTE — Therapy (Signed)
[facility] [place], [place], [address] Phone: [phone]   Fax:  [phone]  Physical Therapy Treatment  Patient Details  Name: [name] MRN: [id number] Date of Birth: 03/17/1946 Referring Provider (PT): Melina Schools MD   Encounter Date: 03/11/2021   PT End of Session - 03/11/21 1146     Visit Number 2    Number of Visits 12    Date for PT Re-Evaluation 04/09/21    PT Start Time 0945    PT Stop Time 1031    PT Time Calculation (min) 46 min    Activity Tolerance Patient tolerated treatment well    Behavior During Therapy [facility] for tasks assessed/performed             Past Medical History:  Diagnosis Date   Allergy    seasonal   Arthritis    [place]    Colon polyps    Complication of anesthesia    per pt, she woke up in the middle of last colon in 2014.   Depression    Diabetes mellitus, type 2 ([place]) 06/2010   Diabetic neuropathy ([place])    Diverticulosis    pt unaware   Esophageal stricture    Fibromyalgia    [place]    GERD (gastroesophageal reflux disease)    Hemorrhoids    Hiatal hernia    Hyperlipidemia    Hypertension    Menopause    Numbness    Peripheral vascular insufficiency (HCC)    Retinopathy    Bilateral   TIA (transient ischemic attack)    Tremor    Vitamin D deficiency     Past Surgical History:  Procedure Laterality Date   ABDOMINAL HYSTERECTOMY     partial and then total   ANKLE SURGERY Right    COLONOSCOPY     ELBOW SURGERY     left   KNEE ARTHROSCOPY Right 09/12/2018   Procedure: ARTHROSCOPY KNEE; SYNOVECTOMY;  Surgeon: Gaynelle Arabian, MD;  [facility];  Service: Orthopedics;  Laterality: Right;  58min   REFRACTIVE SURGERY  2003   SPINAL CORD STIMULATOR INSERTION N/A 01/01/2021   Procedure: SPINAL CORD STIMULATOR PLACEMENT;  Surgeon: Melina Schools, MD;  Location: [place];  Service: Orthopedics;  Laterality: N/A;   TOTAL KNEE ARTHROPLASTY     bilateral    TOTAL KNEE REVISION Right 03/09/2018   Procedure: RIGHT TOTAL KNEE REVISION;  Surgeon: Gaynelle Arabian, MD;  [facility];  Service: Orthopedics;  Laterality: Right;  198min with abductor block   VESICOVAGINAL FISTULA CLOSURE W/ TAH  1981    There were no vitals filed for this visit.   Subjective Assessment - 03/11/21 1144     Subjective COVID-19 screen performed prior to patient entering clinic.  About the same.    Pertinent History Bilateral TKA's, HTN, TIA, Fibromyalgia, DM.    How long can you sit comfortably? Unlimited.    How long can you stand comfortably? Varies.    How long can you walk comfortably? Short community distances.    Patient Stated Goals Decrease pain and be able to walk and stand longer.    Currently in Pain? Yes    Pain Score 4     Pain Location Back    Pain Orientation Right;Left    Pain Descriptors / Indicators Throbbing    Pain Type Chronic pain    Pain Onset More than a month ago  [facility] Adult PT Treatment/Exercise - 03/11/21 0001       Modalities   Modalities Moist Heat      Moist Heat Therapy   Number Minutes Moist Heat 15 Minutes    Moist Heat Location Lumbar Spine      Manual Therapy   Manual Therapy Soft tissue mobilization    Soft tissue mobilization Prone over pillow:  STW/M x 23 minutes to patient bilateral erector spinae musculature and upper gluts and lateral hip musculature to decrease pain and tone.                          PT Long Term Goals - 02/26/21 1127       PT LONG TERM GOAL #1   Title Ind with an HEP.    Time 6    Period Weeks    Status New      PT LONG TERM GOAL #2   Title Stand 20 minutes with LBP level not > 4/10.    Time 6    Period Weeks    Status New      PT LONG TERM GOAL #3   Title Walk a community distance with pain not > 4/10.    Time 6    Period Weeks    Status New                   Plan - 03/11/21 1154     Clinical  Impression Statement Patient did well with treatment today.  Her bilateral lumbar musculature exhibits significant tone.  She was found to be very tender to palpation over her left upper gluteal musculature.    Personal Factors and Comorbidities Comorbidity 1;Other    Comorbidities Bilateral TKA's, HTN, TIA, Fibromyalgia, DM.    Examination-Activity Limitations Locomotion Level;Stand    Examination-Participation Restrictions Other;Meal Prep;Cleaning    Stability/Clinical Decision Making Evolving/Moderate complexity    Rehab Potential Good    PT Duration 6 weeks    PT Treatment/Interventions ADLs/Self Care Home Management;Moist Heat;Therapeutic activities;Therapeutic exercise;Manual techniques;Patient/family education;Passive range of motion;Dry needling    PT Next Visit Plan STW/M and dry needling to patient's bilateral lumbar and lateral hip musculature.    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   Aortic atherosclerosis ([place]) 12/08/2017   Hardening of the aorta (main artery of the heart) ([place]) 12/08/2017   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[place]) 01/14/2015   Depression 02/28/2013   GERD (gastroesophageal reflux disease) 02/28/2013   Hiatal hernia with gastroesophageal reflux 02/28/2013   DDD (degenerative disc disease), lumbosacral 06/23/2012   Fibromyalgia syndrome 06/23/2012   Essential hypertension, benign 06/23/2012    Osteopenia 06/23/2012   Diabetes ([place]) 06/23/2012   Obesity due to excess calories 10/29/2010   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   PERSONAL HISTORY OF COLONIC POLYPS 10/11/2009    Quanta Roher, [place], PT 03/11/2021, 11:56 AM  [facility] [place]  [place], [place], [address] Phone: [phone]   Fax:  [phone]  Name: [name] MRN: [id number] Date of Birth: [birth year]

## 2021-03-13 ENCOUNTER — Other Ambulatory Visit: Payer: Self-pay

## 2021-03-13 ENCOUNTER — Ambulatory Visit: Payer: Medicare Other | Admitting: *Deleted

## 2021-03-13 DIAGNOSIS — M25561 Pain in right knee: Secondary | ICD-10-CM | POA: Diagnosis not present

## 2021-03-13 DIAGNOSIS — M25661 Stiffness of right knee, not elsewhere classified: Secondary | ICD-10-CM | POA: Diagnosis not present

## 2021-03-13 DIAGNOSIS — M545 Low back pain, unspecified: Secondary | ICD-10-CM | POA: Diagnosis not present

## 2021-03-13 DIAGNOSIS — G8929 Other chronic pain: Secondary | ICD-10-CM

## 2021-03-13 NOTE — Therapy (Signed)
[facility] [place], [place], [address] Phone: [phone]   Fax:  [phone]  Physical Therapy Treatment  Patient Details  Name: [name] MRN: [id number] Date of Birth: 06/13/1945 Referring Provider (PT): Melina Schools MD   Encounter Date: 03/13/2021   PT End of Session - 03/13/21 1755     Visit Number 3    Number of Visits 12    Date for PT Re-Evaluation 04/09/21    PT Start Time 0945    PT Stop Time 1033    PT Time Calculation (min) 48 min             Past Medical History:  Diagnosis Date   Allergy    seasonal   Arthritis    [place]    Colon polyps    Complication of anesthesia    per pt, she woke up in the middle of last colon in 2014.   Depression    Diabetes mellitus, type 2 ([place]) 06/2010   Diabetic neuropathy ([place])    Diverticulosis    pt unaware   Esophageal stricture    Fibromyalgia    [place]    GERD (gastroesophageal reflux disease)    Hemorrhoids    Hiatal hernia    Hyperlipidemia    Hypertension    Menopause    Numbness    Peripheral vascular insufficiency (HCC)    Retinopathy    Bilateral   TIA (transient ischemic attack)    Tremor    Vitamin D deficiency     Past Surgical History:  Procedure Laterality Date   ABDOMINAL HYSTERECTOMY     partial and then total   ANKLE SURGERY Right    COLONOSCOPY     ELBOW SURGERY     left   KNEE ARTHROSCOPY Right 09/12/2018   Procedure: ARTHROSCOPY KNEE; SYNOVECTOMY;  Surgeon: Gaynelle Arabian, MD;  [facility];  Service: Orthopedics;  Laterality: Right;  69min   REFRACTIVE SURGERY  2003   SPINAL CORD STIMULATOR INSERTION N/A 01/01/2021   Procedure: SPINAL CORD STIMULATOR PLACEMENT;  Surgeon: Melina Schools, MD;  Location: [place];  Service: Orthopedics;  Laterality: N/A;   TOTAL KNEE ARTHROPLASTY     bilateral   TOTAL KNEE REVISION Right 03/09/2018   Procedure: RIGHT TOTAL KNEE REVISION;  Surgeon: Gaynelle Arabian, MD;  [facility];  Service: Orthopedics;  Laterality: Right;  126min with abductor block   VESICOVAGINAL FISTULA CLOSURE W/ TAH  1981    There were no vitals filed for this visit.   Subjective Assessment - 03/13/21 0945     Subjective COVID-19 screen performed prior to patient entering clinic.  LBP mainly with ADL's and walking    Pertinent History Bilateral TKA's, HTN, TIA, Fibromyalgia, DM.    How long can you sit comfortably? Unlimited.    How long can you stand comfortably? Varies.    How long can you walk comfortably? Short community distances.    Patient Stated Goals Decrease pain and be able to walk and stand longer.    Currently in Pain? Yes    Pain Score 5     Pain Location Back    Pain Orientation Right;Left    Pain Descriptors / Indicators Throbbing    Pain Onset More than a month ago                               [facility] Adult PT Treatment/Exercise -  03/13/21 0001       Self-Care   Self-Care ADL's    ADL's Discussed time management with ADL's and to stop before pain starts. We also discussed intermittent walking throughout the day, but stop before pain starts. She is to time herself today for walking and get a walking time to work with.      Modalities   Modalities Moist Heat      Moist Heat Therapy   Number Minutes Moist Heat 15 Minutes    Moist Heat Location Lumbar Spine      Manual Therapy   Manual Therapy Soft tissue mobilization    Soft tissue mobilization Prone over pillow:  STW/M x 25 minutes to patient bilateral erector spinae musculature and upper glutes and lateral hip musculature to decrease pain and tone.                          PT Long Term Goals - 02/26/21 1127       PT LONG TERM GOAL #1   Title Ind with an HEP.    Time 6    Period Weeks    Status New      PT LONG TERM GOAL #2   Title Stand 20 minutes with LBP level not > 4/10.    Time 6    Period Weeks    Status New      PT LONG TERM GOAL #3   Title Walk a  community distance with pain not > 4/10.    Time 6    Period Weeks    Status New                   Plan - 03/13/21 0947     Clinical Impression Statement Pt arrived today doing fairly at the moment with low pain levels that rise with ADLs. STW was performed to Bil LB paras while in prone position over 2 pillows. ADL modifications and time management were discussed with Pt to help decrease LBP episodes as well as start a intermittent daily walking program. Pt was advised to time her walks to figure out how long she can walk before pain starts and then start her walking program and stop before pain starts.    Personal Factors and Comorbidities Comorbidity 1;Other    Comorbidities Bilateral TKA's, HTN, TIA, Fibromyalgia, DM.    Examination-Activity Limitations Locomotion Level;Stand    Examination-Participation Restrictions Other;Meal Prep;Cleaning    Stability/Clinical Decision Making Evolving/Moderate complexity    Rehab Potential Good    PT Frequency 2x / week    PT Duration 6 weeks    PT Next Visit Plan STW/M and dry needling to patient's bilateral lumbar and lateral hip musculature.    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   Aortic atherosclerosis ([place]) 12/08/2017   Hardening of the aorta (main artery of the heart) ([place]) 12/08/2017   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  [place]) 01/14/2015   Depression 02/28/2013    GERD (gastroesophageal reflux disease) 02/28/2013   Hiatal hernia with gastroesophageal reflux 02/28/2013   DDD (degenerative disc disease), lumbosacral 06/23/2012   Fibromyalgia syndrome 06/23/2012   Essential hypertension, benign 06/23/2012   Osteopenia 06/23/2012   Diabetes ([place]) 06/23/2012   Obesity due to excess calories 10/29/2010   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   PERSONAL HISTORY OF COLONIC POLYPS 10/11/2009    Deannah Rossi,CHRIS, PTA 03/13/2021, 6:03 PM  [facility] [address] [place], [place], [address] Phone: [phone]   Fax:  [phone]  Name: [name] MRN: [id number] Date of Birth: 12-29-1945

## 2021-03-18 ENCOUNTER — Other Ambulatory Visit: Payer: Self-pay

## 2021-03-18 ENCOUNTER — Ambulatory Visit: Payer: Medicare Other

## 2021-03-18 DIAGNOSIS — M545 Low back pain, unspecified: Secondary | ICD-10-CM | POA: Diagnosis not present

## 2021-03-18 DIAGNOSIS — M25561 Pain in right knee: Secondary | ICD-10-CM | POA: Diagnosis not present

## 2021-03-18 DIAGNOSIS — G8929 Other chronic pain: Secondary | ICD-10-CM | POA: Diagnosis not present

## 2021-03-18 DIAGNOSIS — M25661 Stiffness of right knee, not elsewhere classified: Secondary | ICD-10-CM | POA: Diagnosis not present

## 2021-03-18 NOTE — Therapy (Signed)
[facility] [place], [place], [address] Phone: [phone]   Fax:  [phone]  Physical Therapy Treatment  Patient Details  Name: [name] MRN: [id number] Date of Birth: [birth year] Referring Provider (PT): Mary Schools MD   Encounter Date: 03/18/2021   PT End of Session - 03/18/21 0946     Visit Number 4    Number of Visits 12    Date for PT Re-Evaluation 04/09/21    PT Start Time 0945    PT Stop Time 1030    PT Time Calculation (min) 45 min    Activity Tolerance Patient tolerated treatment well    Behavior During Therapy [facility] for tasks assessed/performed             Past Medical History:  Diagnosis Date   Allergy    seasonal   Arthritis    [place]    Colon polyps    Complication of anesthesia    per pt, she woke up in the middle of last colon in 2014.   Depression    Diabetes mellitus, type 2 ([place]) 06/2010   Diabetic neuropathy ([place])    Diverticulosis    pt unaware   Esophageal stricture    Fibromyalgia    [place]    GERD (gastroesophageal reflux disease)    Hemorrhoids    Hiatal hernia    Hyperlipidemia    Hypertension    Menopause    Numbness    Peripheral vascular insufficiency (HCC)    Retinopathy    Bilateral   TIA (transient ischemic attack)    Tremor    Vitamin D deficiency     Past Surgical History:  Procedure Laterality Date   ABDOMINAL HYSTERECTOMY     partial and then total   ANKLE SURGERY Right    COLONOSCOPY     ELBOW SURGERY     left   KNEE ARTHROSCOPY Right 09/12/2018   Procedure: ARTHROSCOPY KNEE; SYNOVECTOMY;  Surgeon: Mary Arabian, MD;  Location: [facility];  Service: Orthopedics;  Laterality: Right;  55min   REFRACTIVE SURGERY  2003   SPINAL CORD STIMULATOR INSERTION N/A 01/01/2021   Procedure: SPINAL CORD STIMULATOR PLACEMENT;  Surgeon: Mary Schools, MD;  Location: [facility];  Service: Orthopedics;  Laterality: N/A;   TOTAL KNEE ARTHROPLASTY     bilateral    TOTAL KNEE REVISION Right 03/09/2018   Procedure: RIGHT TOTAL KNEE REVISION;  Surgeon: Mary Arabian, MD;  [facility];  Service: Orthopedics;  Laterality: Right;  173min with abductor block   VESICOVAGINAL FISTULA CLOSURE W/ TAH  1981    There were no vitals filed for this visit.   Subjective Assessment - 03/18/21 0946     Subjective COVID-19 screen performed prior to patient entering clinic.  Patient reports that her back feels about the same today. She notes that her back felt a little better after her last appointment for the rest of the day.    Pertinent History Bilateral TKA's, HTN, TIA, Fibromyalgia, DM.    How long can you sit comfortably? Unlimited.    How long can you stand comfortably? Varies.    How long can you walk comfortably? Short community distances.    Patient Stated Goals Decrease pain and be able to walk and stand longer.    Currently in Pain? Yes    Pain Score 4     Pain Location Back    Pain Onset More than a month ago  [facility] Adult PT Treatment/Exercise - 03/18/21 0001       Exercises   Exercises Knee/Hip;Lumbar      Lumbar Exercises: Stretches   Lower Trunk Rotation Other (comment)   3 minutes     Knee/Hip Exercises: Supine   Straight Leg Raises Strengthening;Both;3 sets;10 reps    Other Supine Knee/Hip Exercises Clam   2 minutes; no resistance   Other Supine Knee/Hip Exercises Marching   3x10 each     Moist Heat Therapy   Number Minutes Moist Heat 15 Minutes    Moist Heat Location Lumbar Spine      Manual Therapy   Manual Therapy Soft tissue mobilization    Soft tissue mobilization Bilateral lumbar paraspinals and QL   prone over pillow                         PT Long Term Goals - 02/26/21 1127       PT LONG TERM GOAL #1   Title Ind with an HEP.    Time 6    Period Weeks    Status New      PT LONG TERM GOAL #2   Title Stand 20 minutes with LBP level not > 4/10.     Time 6    Period Weeks    Status New      PT LONG TERM GOAL #3   Title Walk a community distance with pain not > 4/10.    Time 6    Period Weeks    Status New                   Plan - 03/18/21 0946     Clinical Impression Statement Treatment was initiated with manual therapy to her lumbar spine for reduced familiar pain and discomfort with soft tissue mobilization to the left QL being the most effective. This was followed by appropriately matched interventions for improved lumbar mobility with moist heat on her low back. She required minimal cuing to maintain knee extension with both lower extremities. She reported feeling good upon the conclusion of treatment. She continues to require skilled physical therapy to address her remaining impairments to return to her prior level of function    Personal Factors and Comorbidities Comorbidity 1;Other    Comorbidities Bilateral TKA's, HTN, TIA, Fibromyalgia, DM.    Examination-Activity Limitations Locomotion Level;Stand    Examination-Participation Restrictions Other;Meal Prep;Cleaning    Stability/Clinical Decision Making Evolving/Moderate complexity    Rehab Potential Good    PT Frequency 2x / week    PT Duration 6 weeks    PT Next Visit Plan STW/M and dry needling to patient's bilateral lumbar and lateral hip musculature.    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    Aortic atherosclerosis ([place]) 12/08/2017   Hardening of the aorta (main artery of the heart) ([place]) 12/08/2017   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[place]) 01/14/2015  Depression 02/28/2013   GERD (gastroesophageal reflux disease) 02/28/2013   Hiatal hernia with gastroesophageal reflux 02/28/2013   DDD (degenerative disc disease), lumbosacral 06/23/2012   Fibromyalgia syndrome 06/23/2012   Essential hypertension, benign 06/23/2012   Osteopenia 06/23/2012   Diabetes ([place]) 06/23/2012   Obesity due to excess calories 10/29/2010   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   PERSONAL HISTORY OF COLONIC POLYPS 10/11/2009    Darlin Coco, PT 03/18/2021, 12:18 PM  [facility] [address] [place], [place], [address] Phone: [phone]   Fax:  [phone]  Name: [name] MRN: [id number] Date of Birth: 07/06/1945

## 2021-03-20 ENCOUNTER — Other Ambulatory Visit: Payer: Self-pay

## 2021-03-20 ENCOUNTER — Encounter: Payer: Self-pay | Admitting: Physical Therapy

## 2021-03-20 ENCOUNTER — Ambulatory Visit: Payer: Medicare Other | Admitting: Physical Therapy

## 2021-03-20 DIAGNOSIS — M545 Low back pain, unspecified: Secondary | ICD-10-CM | POA: Diagnosis not present

## 2021-03-20 DIAGNOSIS — M25661 Stiffness of right knee, not elsewhere classified: Secondary | ICD-10-CM | POA: Diagnosis not present

## 2021-03-20 DIAGNOSIS — M25561 Pain in right knee: Secondary | ICD-10-CM | POA: Diagnosis not present

## 2021-03-20 DIAGNOSIS — G8929 Other chronic pain: Secondary | ICD-10-CM | POA: Diagnosis not present

## 2021-03-20 NOTE — Therapy (Signed)
[facility] [place], [place], [address] Phone: [phone]   Fax:  [phone]  Physical Therapy Treatment  Patient Details  Name: [name] MRN: [id number] Date of Birth: 06-04-1945 Referring Provider (PT): Melina Schools MD   Encounter Date: 03/20/2021   PT End of Session - 03/20/21 0950     Visit Number 5    Number of Visits 12    Date for PT Re-Evaluation 04/09/21    PT Start Time 0950    PT Stop Time 1030    PT Time Calculation (min) 40 min    Activity Tolerance Patient tolerated treatment well    Behavior During Therapy [facility] for tasks assessed/performed             Past Medical History:  Diagnosis Date   Allergy    seasonal   Arthritis    [place]    Colon polyps    Complication of anesthesia    per pt, she woke up in the middle of last colon in 2014.   Depression    Diabetes mellitus, type 2 ([place]) 06/2010   Diabetic neuropathy ([place])    Diverticulosis    pt unaware   Esophageal stricture    Fibromyalgia    [place]    GERD (gastroesophageal reflux disease)    Hemorrhoids    Hiatal hernia    Hyperlipidemia    Hypertension    Menopause    Numbness    Peripheral vascular insufficiency (HCC)    Retinopathy    Bilateral   TIA (transient ischemic attack)    Tremor    Vitamin D deficiency     Past Surgical History:  Procedure Laterality Date   ABDOMINAL HYSTERECTOMY     partial and then total   ANKLE SURGERY Right    COLONOSCOPY     ELBOW SURGERY     left   KNEE ARTHROSCOPY Right 09/12/2018   Procedure: ARTHROSCOPY KNEE; SYNOVECTOMY;  Surgeon: Gaynelle Arabian, MD;  [facility];  Service: Orthopedics;  Laterality: Right;  85min   REFRACTIVE SURGERY  2003   SPINAL CORD STIMULATOR INSERTION N/A 01/01/2021   Procedure: SPINAL CORD STIMULATOR PLACEMENT;  Surgeon: Melina Schools, MD;  Location: [facility];  Service: Orthopedics;  Laterality: N/A;   TOTAL KNEE ARTHROPLASTY     bilateral    TOTAL KNEE REVISION Right 03/09/2018   Procedure: RIGHT TOTAL KNEE REVISION;  Surgeon: Gaynelle Arabian, MD;  [facility];  Service: Orthopedics;  Laterality: Right;  120min with abductor block   VESICOVAGINAL FISTULA CLOSURE W/ TAH  1981    There were no vitals filed for this visit.   Subjective Assessment - 03/20/21 0949     Subjective COVID-19 screen performed prior to patient entering clinic. Reports continued pain.    Pertinent History Bilateral TKA's, HTN, TIA, Fibromyalgia, DM.    How long can you sit comfortably? Unlimited.    How long can you stand comfortably? Varies.    How long can you walk comfortably? Short community distances.    Patient Stated Goals Decrease pain and be able to walk and stand longer.    Currently in Pain? No/denies                [facility] PT Assessment - 03/20/21 0001       Assessment   Medical Diagnosis Low back pain.    Referring Provider (PT) Melina Schools MD  [place] Adult PT Treatment/Exercise - 03/20/21 0001       Modalities   Modalities Moist Heat      Moist Heat Therapy   Number Minutes Moist Heat 15 Minutes    Moist Heat Location Lumbar Spine      Manual Therapy   Manual Therapy Soft tissue mobilization    Soft tissue mobilization STW to B lumbar paraspinals, QL, glutes to reduce pain and TPs                          PT Long Term Goals - 02/26/21 1127       PT LONG TERM GOAL #1   Title Ind with an HEP.    Time 6    Period Weeks    Status New      PT LONG TERM GOAL #2   Title Stand 20 minutes with LBP level not > 4/10.    Time 6    Period Weeks    Status New      PT LONG TERM GOAL #3   Title Walk a community distance with pain not > 4/10.    Time 6    Period Weeks    Status New                   Plan - 03/20/21 1046     Clinical Impression Statement Patient presented in clinic with continued LBP and reports that surgeon's office is still  trying to find a program that affects her pain. Patient cannot stand or walk for greater than 5 minutes which limits preparing meals, etc. TPs notable in B glutes. Good mobility of the latest incision for application of stimulator. Normal moist heat response.    Personal Factors and Comorbidities Comorbidity 1;Other    Comorbidities Bilateral TKA's, HTN, TIA, Fibromyalgia, DM.    Examination-Activity Limitations Locomotion Level;Stand    Examination-Participation Restrictions Other;Meal Prep;Cleaning    Stability/Clinical Decision Making Evolving/Moderate complexity    Rehab Potential Good    PT Frequency 2x / week    PT Duration 6 weeks    PT Treatment/Interventions ADLs/Self Care Home Management;Moist Heat;Therapeutic activities;Therapeutic exercise;Manual techniques;Patient/family education;Passive range of motion;Dry needling    PT Next Visit Plan STW/M and dry needling to patient's bilateral lumbar and lateral hip musculature.    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   Aortic atherosclerosis ([place]) 12/08/2017   Hardening of the aorta (main artery of the heart) ([place]) 12/08/2017   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[place]) 01/14/2015   Depression 02/28/2013   GERD (gastroesophageal reflux disease) 02/28/2013   Hiatal hernia with gastroesophageal reflux 02/28/2013   DDD (degenerative disc disease), lumbosacral 06/23/2012    Fibromyalgia syndrome 06/23/2012   Essential hypertension, benign 06/23/2012   Osteopenia 06/23/2012   Diabetes ([place]) 06/23/2012   Obesity due to excess calories 10/29/2010   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   PERSONAL HISTORY OF COLONIC  POLYPS 10/11/2009    Standley Brooking, PTA 03/20/2021, 10:52 AM  [facility] [address] [place], [place], [address] Phone: [phone]   Fax:  [phone]  Name: COOPER STAMP MRN: [id number] Date of Birth: [birth year]

## 2021-03-25 ENCOUNTER — Other Ambulatory Visit: Payer: Self-pay

## 2021-03-25 ENCOUNTER — Ambulatory Visit: Payer: Medicare Other | Admitting: *Deleted

## 2021-03-25 DIAGNOSIS — M25661 Stiffness of right knee, not elsewhere classified: Secondary | ICD-10-CM | POA: Diagnosis not present

## 2021-03-25 DIAGNOSIS — M545 Low back pain, unspecified: Secondary | ICD-10-CM | POA: Diagnosis not present

## 2021-03-25 DIAGNOSIS — M25561 Pain in right knee: Secondary | ICD-10-CM | POA: Diagnosis not present

## 2021-03-25 DIAGNOSIS — G8929 Other chronic pain: Secondary | ICD-10-CM

## 2021-03-25 NOTE — Therapy (Signed)
[facility] [place], [place], [address] Phone: [phone]   Fax:  [phone]  Physical Therapy Treatment  Patient Details  Name: [name] MRN: [id number] Date of Birth: Jul 23, 1945 Referring Provider (PT): Melina Schools MD   Encounter Date: 03/25/2021   PT End of Session - 03/25/21 0951     Visit Number 6    Number of Visits 12    Date for PT Re-Evaluation 04/09/21    PT Start Time 0945    PT Stop Time 1033    PT Time Calculation (min) 48 min             Past Medical History:  Diagnosis Date   Allergy    seasonal   Arthritis    [place]    Colon polyps    Complication of anesthesia    per pt, she woke up in the middle of last colon in 2014.   Depression    Diabetes mellitus, type 2 ([place]) 06/2010   Diabetic neuropathy ([place])    Diverticulosis    pt unaware   Esophageal stricture    Fibromyalgia    [place]    GERD (gastroesophageal reflux disease)    Hemorrhoids    Hiatal hernia    Hyperlipidemia    Hypertension    Menopause    Numbness    Peripheral vascular insufficiency (HCC)    Retinopathy    Bilateral   TIA (transient ischemic attack)    Tremor    Vitamin D deficiency     Past Surgical History:  Procedure Laterality Date   ABDOMINAL HYSTERECTOMY     partial and then total   ANKLE SURGERY Right    COLONOSCOPY     ELBOW SURGERY     left   KNEE ARTHROSCOPY Right 09/12/2018   Procedure: ARTHROSCOPY KNEE; SYNOVECTOMY;  Surgeon: Gaynelle Arabian, MD;  [facility]: [facility];  Service: Orthopedics;  Laterality: Right;  34min   REFRACTIVE SURGERY  2003   SPINAL CORD STIMULATOR INSERTION N/A 01/01/2021   Procedure: SPINAL CORD STIMULATOR PLACEMENT;  Surgeon: Melina Schools, MD;  Location: [facility];  Service: Orthopedics;  Laterality: N/A;   TOTAL KNEE ARTHROPLASTY     bilateral   TOTAL KNEE REVISION Right 03/09/2018   Procedure: RIGHT TOTAL KNEE REVISION;  Surgeon: Gaynelle Arabian, MD;  [facility];  Service: Orthopedics;  Laterality: Right;  143min with abductor block   VESICOVAGINAL FISTULA CLOSURE W/ TAH  1981    There were no vitals filed for this visit.                      [facility] Adult PT Treatment/Exercise - 03/25/21 0001       Exercises   Exercises Knee/Hip;Lumbar      Modalities   Modalities Moist Heat      Moist Heat Therapy   Number Minutes Moist Heat 15 Minutes    Moist Heat Location Lumbar Spine      Manual Therapy   Manual Therapy Soft tissue mobilization    Soft tissue mobilization STW to B lumbar paraspinals, QL, glutes to reduce pain/tension and TPs while prone                          PT Long Term Goals - 02/26/21 1127       PT LONG TERM GOAL #1   Title Ind with an HEP.    Time 6  Period Weeks    Status New      PT LONG TERM GOAL #2   Title Stand 20 minutes with LBP level not > 4/10.    Time 6    Period Weeks    Status New      PT LONG TERM GOAL #3   Title Walk a community distance with pain not > 4/10.    Time 6    Period Weeks    Status New                   Plan - 03/25/21 7782     Clinical Impression Statement Pt arrived today doing fair today with not much change in standing pain levels. She also reports having program changed in her stimulator. She does well in sitting , but pain increases the longer she stands. She did well with STW and had decreased mm tension after session.    Personal Factors and Comorbidities Comorbidity 1;Other    Comorbidities Bilateral TKA's, HTN, TIA, Fibromyalgia, DM.    Examination-Activity Limitations Locomotion Level;Stand    Examination-Participation Restrictions Other;Meal Prep;Cleaning    Stability/Clinical Decision Making Evolving/Moderate complexity    Rehab Potential Good    PT Frequency 2x / week    PT Duration 6 weeks    PT Treatment/Interventions ADLs/Self Care Home Management;Moist Heat;Therapeutic activities;Therapeutic exercise;Manual  techniques;Patient/family education;Passive range of motion;Dry needling    PT Next Visit Plan STW/M and dry needling to patient's bilateral lumbar and lateral hip musculature.    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   Aortic atherosclerosis ([place]) 12/08/2017   Hardening of the aorta (main artery of the heart) ([place]) 12/08/2017   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[place]) 01/14/2015   Depression 02/28/2013   GERD (gastroesophageal reflux disease) 02/28/2013   Hiatal hernia with gastroesophageal reflux 02/28/2013   DDD (degenerative disc disease), lumbosacral 06/23/2012   Fibromyalgia syndrome 06/23/2012   Essential hypertension, benign 06/23/2012   Osteopenia 06/23/2012   Diabetes ([place]) 06/23/2012   Obesity due to excess calories 10/29/2010   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   PERSONAL HISTORY OF COLONIC POLYPS 10/11/2009    [name],CHRIS, PTA 03/25/2021, 11:46 AM  [facility] [address] [place], [place], [address] Phone: [phone]   Fax:  [phone]  Name: [name] MRN: [id number] Date of Birth: 1945/12/09

## 2021-03-27 ENCOUNTER — Other Ambulatory Visit: Payer: Self-pay

## 2021-03-27 ENCOUNTER — Ambulatory Visit: Payer: Medicare Other | Admitting: *Deleted

## 2021-03-27 DIAGNOSIS — G8929 Other chronic pain: Secondary | ICD-10-CM

## 2021-03-27 DIAGNOSIS — M25661 Stiffness of right knee, not elsewhere classified: Secondary | ICD-10-CM | POA: Diagnosis not present

## 2021-03-27 DIAGNOSIS — M25561 Pain in right knee: Secondary | ICD-10-CM | POA: Diagnosis not present

## 2021-03-27 DIAGNOSIS — M545 Low back pain, unspecified: Secondary | ICD-10-CM | POA: Diagnosis not present

## 2021-03-27 NOTE — Therapy (Signed)
[facility] [place], [place], [address] Phone: [phone]   Fax:  [phone]  Physical Therapy Treatment  Patient Details  Name: [name] MRN: [id number] Date of Birth: 11-14-1945 Referring Provider (PT): Melina Schools MD   Encounter Date: 03/27/2021   PT End of Session - 03/27/21 0853     Visit Number 7    Number of Visits 12    Date for PT Re-Evaluation 04/09/21    PT Start Time 0815    PT Stop Time 0856    PT Time Calculation (min) 41 min             Past Medical History:  Diagnosis Date   Allergy    seasonal   Arthritis    [place]    Colon polyps    Complication of anesthesia    per pt, she woke up in the middle of last colon in 2014.   Depression    Diabetes mellitus, type 2 ([facility]) 06/2010   Diabetic neuropathy ([place])    Diverticulosis    pt unaware   Esophageal stricture    Fibromyalgia    [place]    GERD (gastroesophageal reflux disease)    Hemorrhoids    Hiatal hernia    Hyperlipidemia    Hypertension    Menopause    Numbness    Peripheral vascular insufficiency (HCC)    Retinopathy    Bilateral   TIA (transient ischemic attack)    Tremor    Vitamin D deficiency     Past Surgical History:  Procedure Laterality Date   ABDOMINAL HYSTERECTOMY     partial and then total   ANKLE SURGERY Right    COLONOSCOPY     ELBOW SURGERY     left   KNEE ARTHROSCOPY Right 09/12/2018   Procedure: ARTHROSCOPY KNEE; SYNOVECTOMY;  Surgeon: Gaynelle Arabian, MD;  Location: [facility];  Service: Orthopedics;  Laterality: Right;  71min   REFRACTIVE SURGERY  2003   SPINAL CORD STIMULATOR INSERTION N/A 01/01/2021   Procedure: SPINAL CORD STIMULATOR PLACEMENT;  Surgeon: Melina Schools, MD;  Location: [facility];  Service: Orthopedics;  Laterality: N/A;   TOTAL KNEE ARTHROPLASTY     bilateral   TOTAL KNEE REVISION Right 03/09/2018   Procedure: RIGHT TOTAL KNEE REVISION;  Surgeon: Gaynelle Arabian, MD;  Location:  [facility];  Service: Orthopedics;  Laterality: Right;  1107min with abductor block   VESICOVAGINAL FISTULA CLOSURE W/ TAH  1981    There were no vitals filed for this visit.   Subjective Assessment - 03/27/21 0811     Subjective COVID-19 screen performed prior to patient entering clinic. Pt arrived today doing better after last Rx and reports being able to stand a little longer    Pertinent History Bilateral TKA's, HTN, TIA, Fibromyalgia, DM.    How long can you sit comfortably? Unlimited.    How long can you stand comfortably? Varies.    How long can you walk comfortably? Short community distances.    Patient Stated Goals Decrease pain and be able to walk and stand longer.    Currently in Pain? Yes    Pain Score 2     Pain Location Back    Pain Orientation Left;Right    Pain Descriptors / Indicators Throbbing    Pain Type Chronic pain    Pain Onset More than a month ago  [facility] Adult PT Treatment/Exercise - 03/27/21 0001       Exercises   Exercises Knee/Hip;Lumbar      Modalities   Modalities Moist Heat      Moist Heat Therapy   Number Minutes Moist Heat 15 Minutes    Moist Heat Location Lumbar Spine      Manual Therapy   Manual Therapy Soft tissue mobilization    Soft tissue mobilization STW to B lumbar paraspinals, QL, glutes to reduce pain/tension and TPs while prone                          PT Long Term Goals - 02/26/21 1127       PT LONG TERM GOAL #1   Title Ind with an HEP.    Time 6    Period Weeks    Status New      PT LONG TERM GOAL #2   Title Stand 20 minutes with LBP level not > 4/10.    Time 6    Period Weeks    Status New      PT LONG TERM GOAL #3   Title Walk a community distance with pain not > 4/10.    Time 6    Period Weeks    Status New                   Plan - 03/27/21 4650     Clinical Impression Statement Pt arrived today doing better with decreased mm  tension/pain in LB and reports being able to stand a little longer with less pain. She had notable decreased soreness in posterior musclature especially LT side glutes.    Personal Factors and Comorbidities Comorbidity 1;Other    Comorbidities Bilateral TKA's, HTN, TIA, Fibromyalgia, DM.    Examination-Participation Restrictions Other;Meal Prep;Cleaning    Stability/Clinical Decision Making Evolving/Moderate complexity    Rehab Potential Good    PT Frequency 2x / week    PT Duration 6 weeks    PT Treatment/Interventions ADLs/Self Care Home Management;Moist Heat;Therapeutic activities;Therapeutic exercise;Manual techniques;Patient/family education;Passive range of motion;Dry needling    PT Next Visit Plan STW/M and dry needling to patient's bilateral lumbar and lateral hip musculature.    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   Aortic atherosclerosis ([place]) 12/08/2017   Hardening of the aorta (main artery of the heart) ([place]) 12/08/2017   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[place]) 01/14/2015   Depression 02/28/2013   GERD (gastroesophageal reflux disease) 02/28/2013   Hiatal hernia with gastroesophageal reflux 02/28/2013   DDD (degenerative disc disease), lumbosacral 06/23/2012   Fibromyalgia syndrome 06/23/2012   Essential hypertension, benign 06/23/2012   Osteopenia 06/23/2012   Diabetes ([place]) 06/23/2012   Obesity due to excess calories  10/29/2010   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   PERSONAL HISTORY OF COLONIC POLYPS 10/11/2009    [name],CHRIS, PTA 03/27/2021, 10:19 AM  [facility] [place], [place], [address] Phone: [phone]   Fax:  [phone]  Name: [name] MRN: [id number] Date of Birth: 1945-11-14

## 2021-03-28 ENCOUNTER — Encounter: Payer: Self-pay | Admitting: Family Medicine

## 2021-03-28 ENCOUNTER — Ambulatory Visit (INDEPENDENT_AMBULATORY_CARE_PROVIDER_SITE_OTHER): Payer: Medicare Other | Admitting: Family Medicine

## 2021-03-28 VITALS — BP 123/69 | HR 98 | Ht 66.0 in | Wt 201.0 lb

## 2021-03-28 DIAGNOSIS — E1142 Type 2 diabetes mellitus with diabetic polyneuropathy: Secondary | ICD-10-CM

## 2021-03-28 DIAGNOSIS — E11610 Type 2 diabetes mellitus with diabetic neuropathic arthropathy: Secondary | ICD-10-CM

## 2021-03-28 DIAGNOSIS — I10 Essential (primary) hypertension: Secondary | ICD-10-CM | POA: Diagnosis not present

## 2021-03-28 DIAGNOSIS — F320 Major depressive disorder, single episode, mild: Secondary | ICD-10-CM

## 2021-03-28 DIAGNOSIS — Z79899 Other long term (current) drug therapy: Secondary | ICD-10-CM

## 2021-03-28 DIAGNOSIS — E1143 Type 2 diabetes mellitus with diabetic autonomic (poly)neuropathy: Secondary | ICD-10-CM

## 2021-03-28 LAB — BAYER DCA HB A1C WAIVED: HB A1C (BAYER DCA - WAIVED): 6.3 % — ABNORMAL HIGH (ref 4.8–5.6)

## 2021-03-28 MED ORDER — ALPRAZOLAM 0.5 MG PO TABS: 0.5000 mg | ORAL_TABLET | Freq: Every day | ORAL | 2 refills | Status: DC | PRN

## 2021-03-28 NOTE — Progress Notes (Signed)
BP 123/69    Pulse 98    Ht 5' 6"  (1.676 m)    Wt 201 lb (91.2 kg)    SpO2 96%    BMI 32.44 kg/m    Subjective:   [name], female    DOB: 1945/10/15, 75 y.o.   MRN: [id number]  HPI: [name] is a 75 y.o. female presenting on 03/28/2021 for Medical Management of Chronic Issues and Diabetes   HPI Anxiety and fibromyalgia recheck Current rx-Xanax and Abilify and Lexapro and gabapentin # meds rx- 0.5-1 daily prn Effectiveness of current meds-works well Adverse reactions form meds-none  Pill count performed-No Last drug screen -01/15/2021 ( high risk q45m moderate risk q668mlow risk yearly ) Urine drug screen today- No [place]  If yes were their any concerning findings? -None  No flowsheet data found.   Controlled substance contract signed on: 01/15/2021  Type 2 diabetes mellitus Patient comes in today for recheck of his diabetes. Patient has been currently taking glipizide and Synjardy and Ozempic. Patient is currently on an ACE inhibitor/ARB. Patient has seen an ophthalmologist this year. Patient denies any issues with their feet. The symptom started onset as an adult hypertension and atherosclerosis and neuropathy ARE RELATED TO DM   Hypertension Patient is currently on lisinopril hydrochlorothiazide, and their blood pressure today is 123/69. Patient denies any lightheadedness or dizziness. Patient denies headaches, blurred vision, chest pains, shortness of breath, or weakness. Denies any side effects from medication and is content with current medication.   Relevant past medical, surgical, family and social history reviewed and updated as indicated. Interim medical history since our last visit reviewed. Allergies and medications reviewed and updated.  Review of Systems  Constitutional:  Negative for chills and fever.  Eyes:  Negative for visual disturbance.  Respiratory:  Negative for chest tightness and shortness of breath.    Cardiovascular:  Negative for chest pain and leg swelling.  Musculoskeletal:  Positive for arthralgias and myalgias. Negative for back pain and gait problem.  Skin:  Negative for rash.  Neurological:  Negative for light-headedness and headaches.  Psychiatric/Behavioral:  Positive for sleep disturbance. Negative for agitation and behavioral problems.   All other systems reviewed and are negative.  Per HPI unless specifically indicated above   Allergies as of 03/28/2021       Reactions   Penicillins Other (See Comments)   Blisters in mouth Did it involve swelling of the face/tongue/throat, SOB, or low BP? No Did it involve sudden or severe rash/hives, skin peeling, or any reaction on the inside of your mouth or nose? No Did you need to seek medical attention at a hospital or doctor's office? No When did it last happen?      Childhood allergy If all above answers are "NO", may proceed with cephalosporin use.   Codeine Nausea And Vomiting   Erythromycin Nausea And Vomiting   Fenofibrate Other (See Comments)   aching & edema    Lyrica [pregabalin] Other (See Comments)   Edema in feet   Statins Other (See Comments)   Joints ache   Sulfa Antibiotics Nausea And Vomiting        Medication List        Accurate as of March 28, 2021 11:08 AM. If you have any questions, ask your nurse or doctor.          ALPRAZolam 0.5 MG tablet Commonly known as: XANAX Take 1-2 tablets (0.5-1 mg total) by  mouth daily as needed for anxiety. Take 1-2 tabs daily as needed What changed: how much to take Changed by: Fransisca Kaufmann Thaily Hackworth, MD   ARIPiprazole 2 MG tablet Commonly known as: ABILIFY Take 1 tablet (2 mg total) by mouth daily. What changed:  how much to take when to take this   atorvastatin 10 MG tablet Commonly known as: LIPITOR Take 1 tablet (10 mg total) by mouth daily.   blood glucose meter kit and supplies Kit Dispense based on patient and insurance preference. Use up to  two times daily as directed. (Dx: type 2 DM - E11.9)   clopidogrel 75 MG tablet Commonly known as: PLAVIX Take 1 tablet (75 mg total) by mouth daily.   docusate sodium 100 MG capsule Commonly known as: COLACE Take 1 capsule (100 mg total) by mouth every 12 (twelve) hours.   escitalopram 20 MG tablet Commonly known as: LEXAPRO Take 1 tablet (20 mg total) by mouth daily.   ezetimibe 10 MG tablet Commonly known as: ZETIA Take 1 tablet (10 mg total) by mouth daily.   Fish Oil 1000 MG Caps Take 1,000 mg by mouth daily.   gabapentin 600 MG tablet Commonly known as: NEURONTIN Take 1 tablet (600 mg total) by mouth in the morning, at noon, in the evening, and at bedtime.   glipiZIDE 5 MG 24 hr tablet Commonly known as: GLUCOTROL XL Take 1 tablet (5 mg total) by mouth daily with breakfast.   lisinopril-hydrochlorothiazide 20-25 MG tablet Commonly known as: ZESTORETIC Take 1 tablet by mouth daily.   meclizine 25 MG tablet Commonly known as: ANTIVERT TAKE ONE TABLET THREE TIMES DAILY AS NEEDED FOR DIZZINESS   methocarbamol 500 MG tablet Commonly known as: ROBAXIN Take 1 tablet (500 mg total) by mouth 2 (two) times daily as needed for muscle spasms.   nicotine polacrilex 2 MG gum Commonly known as: NICORETTE Take 2 mg by mouth as needed for smoking cessation.   ondansetron 4 MG tablet Commonly known as: Zofran Take 1 tablet (4 mg total) by mouth every 8 (eight) hours as needed for nausea or vomiting.   ONE TOUCH ULTRA 2 w/Device Kit CHECK BLOOD SUGAR UP TO TWICE DAILY Dx N17.0   OneTouch Delica Lancets 01V Misc CHECK BLOOD SUGAR UP TO TWICE DAILY Dx E11.9   OneTouch Ultra test strip Generic drug: glucose blood Check BS up to 2 times daily Dx 11.43   Ozempic (1 MG/DOSE) 4 MG/3ML Sopn Generic drug: Semaglutide (1 MG/DOSE) Inject 1 mg into the skin once a week. What changed: when to take this   pantoprazole 40 MG tablet Commonly known as: PROTONIX Take 1 tablet (40  mg total) by mouth daily.   polyethylene glycol 17 g packet Commonly known as: MIRALAX / GLYCOLAX Take 17 g by mouth 2 (two) times daily as needed (constipation).   Premarin vaginal cream Generic drug: conjugated estrogens Place vaginally at bedtime. What changed:  how much to take when to take this reasons to take this   Synjardy XR 25-1000 MG Tb24 Generic drug: Empagliflozin-metFORMIN HCl ER Take 1 tablet by mouth daily.   Vitamin D 50 MCG (2000 UT) tablet Take 2,000 Units by mouth daily.         Objective:   BP 123/69    Pulse 98    Ht 5' 6"  (1.676 m)    Wt 201 lb (91.2 kg)    SpO2 96%    BMI 32.44 kg/m   Wt Readings from Last 3  Encounters:  03/28/21 201 lb (91.2 kg)  01/03/21 203 lb 0.7 oz (92.1 kg)  01/01/21 203 lb (92.1 kg)    Physical Exam Vitals and nursing note reviewed.  Constitutional:      General: She is not in acute distress.    Appearance: She is well-developed. She is not diaphoretic.  Eyes:     Conjunctiva/sclera: Conjunctivae normal.  Cardiovascular:     Rate and Rhythm: Normal rate and regular rhythm.     Heart sounds: Normal heart sounds. No murmur heard. Pulmonary:     Effort: Pulmonary effort is normal. No respiratory distress.     Breath sounds: Normal breath sounds. No wheezing.  Musculoskeletal:        General: No swelling or tenderness. Normal range of motion.  Skin:    General: Skin is warm and dry.     Findings: No rash.  Neurological:     Mental Status: She is alert and oriented to person, place, and time.     Coordination: Coordination normal.  Psychiatric:        Behavior: Behavior normal.      Assessment & Plan:   Problem List Items Addressed This Visit       Cardiovascular and Mediastinum   Essential hypertension, benign (Chronic)     Endocrine   Diabetes ([place]) - Primary (Chronic)   Relevant Orders   Bayer DCA Hb A1c Waived   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HCC)   Relevant Medications   ALPRAZolam (XANAX)  0.5 MG tablet   Diabetic autonomic neuropathy associated with type 2 diabetes mellitus ([place])     Other   Depression   Relevant Medications   ALPRAZolam (XANAX) 0.5 MG tablet   Other Visit Diagnoses     Controlled substance agreement signed       Relevant Medications   ALPRAZolam (XANAX) 0.5 MG tablet       Continue current medicine, no changes, focus on diet.  A1c looks good at 6.3 Follow up plan: Return in about 3 months (around 06/26/2021), or if symptoms worsen or fail to improve, for Diabetes and anxiety recheck.  Counseling provided for all of the vaccine components Orders Placed This Encounter  Procedures   Bayer [place] Hb A1c [place] Won Kreuzer, MD [facility] 03/28/2021, 11:08 AM

## 2021-04-01 ENCOUNTER — Other Ambulatory Visit: Payer: Self-pay

## 2021-04-01 ENCOUNTER — Encounter: Payer: Self-pay | Admitting: Physical Therapy

## 2021-04-01 ENCOUNTER — Ambulatory Visit: Payer: Medicare Other | Admitting: Physical Therapy

## 2021-04-01 DIAGNOSIS — M25561 Pain in right knee: Secondary | ICD-10-CM | POA: Diagnosis not present

## 2021-04-01 DIAGNOSIS — M545 Low back pain, unspecified: Secondary | ICD-10-CM | POA: Diagnosis not present

## 2021-04-01 DIAGNOSIS — M25661 Stiffness of right knee, not elsewhere classified: Secondary | ICD-10-CM | POA: Diagnosis not present

## 2021-04-01 DIAGNOSIS — G8929 Other chronic pain: Secondary | ICD-10-CM | POA: Diagnosis not present

## 2021-04-01 NOTE — Therapy (Signed)
[facility] [place], [place], [address] Phone: [phone]   Fax:  [phone]  Physical Therapy Treatment  Patient Details  Name: [name] MRN: [id number] Date of Birth: 04-17-1945 Referring Provider (PT): Melina Schools MD   Encounter Date: 04/01/2021   PT End of Session - 04/01/21 1037     Visit Number 8    Number of Visits 12    Date for PT Re-Evaluation 04/09/21    PT Start Time 0945    PT Stop Time 1023    PT Time Calculation (min) 38 min    Activity Tolerance Patient tolerated treatment well    Behavior During Therapy [facility] for tasks assessed/performed             Past Medical History:  Diagnosis Date   Allergy    seasonal   Arthritis    [place]    Colon polyps    Complication of anesthesia    per pt, she woke up in the middle of last colon in 2014.   Depression    Diabetes mellitus, type 2 ([place]) 06/2010   Diabetic neuropathy ([place])    Diverticulosis    pt unaware   Esophageal stricture    Fibromyalgia    [place]    GERD (gastroesophageal reflux disease)    Hemorrhoids    Hiatal hernia    Hyperlipidemia    Hypertension    Menopause    Numbness    Peripheral vascular insufficiency (HCC)    Retinopathy    Bilateral   TIA (transient ischemic attack)    Tremor    Vitamin D deficiency     Past Surgical History:  Procedure Laterality Date   ABDOMINAL HYSTERECTOMY     partial and then total   ANKLE SURGERY Right    COLONOSCOPY     ELBOW SURGERY     left   KNEE ARTHROSCOPY Right 09/12/2018   Procedure: ARTHROSCOPY KNEE; SYNOVECTOMY;  Surgeon: Gaynelle Arabian, MD;  Location: [facility];  Service: Orthopedics;  Laterality: Right;  60min   REFRACTIVE SURGERY  2003   SPINAL CORD STIMULATOR INSERTION N/A 01/01/2021   Procedure: SPINAL CORD STIMULATOR PLACEMENT;  Surgeon: Melina Schools, MD;  Location: [facility];  Service: Orthopedics;  Laterality: N/A;   TOTAL KNEE ARTHROPLASTY     bilateral    TOTAL KNEE REVISION Right 03/09/2018   Procedure: RIGHT TOTAL KNEE REVISION;  Surgeon: Gaynelle Arabian, MD;  [facility];  Service: Orthopedics;  Laterality: Right;  158min with abductor block   VESICOVAGINAL FISTULA CLOSURE W/ TAH  1981    There were no vitals filed for this visit.   Subjective Assessment - 04/01/21 1036     Subjective COVID-19 screen performed prior to patient entering clinic.  Feels better.  Most pain on left.    Pertinent History Bilateral TKA's, HTN, TIA, Fibromyalgia, DM.    How long can you sit comfortably? Unlimited.    How long can you stand comfortably? Varies.    How long can you walk comfortably? Short community distances.    Patient Stated Goals Decrease pain and be able to walk and stand longer.    Currently in Pain? Yes    Pain Score 2     Pain Location Back    Pain Orientation Left                               [facility] Adult PT  Treatment/Exercise - 04/01/21 0001       Modalities   Modalities Moist Heat      Moist Heat Therapy   Number Minutes Moist Heat 10 Minutes    Moist Heat Location Lumbar Spine      Manual Therapy   Manual Therapy Soft tissue mobilization    Soft tissue mobilization Prone over pillow for comfort:  STW/M x23 minutes to patient's left low back and SIJ                          PT Long Term Goals - 02/26/21 1127       PT LONG TERM GOAL #1   Title Ind with an HEP.    Time 6    Period Weeks    Status New      PT LONG TERM GOAL #2   Title Stand 20 minutes with LBP level not > 4/10.    Time 6    Period Weeks    Status New      PT LONG TERM GOAL #3   Title Walk a community distance with pain not > 4/10.    Time 6    Period Weeks    Status New                   Plan - 04/01/21 1241     Clinical Impression Statement Patient did very well today.  No c/o right-sided low back pain today.  Most pain localized to her left SIJ.    Personal Factors and Comorbidities  Comorbidity 1;Other    Comorbidities Bilateral TKA's, HTN, TIA, Fibromyalgia, DM.    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   Aortic atherosclerosis ([place]) 12/08/2017   Hardening of the aorta (main artery of the heart) ([place]) 12/08/2017   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[place]) 01/14/2015   Depression 02/28/2013   GERD (gastroesophageal reflux disease) 02/28/2013   Hiatal hernia with gastroesophageal reflux 02/28/2013   DDD (degenerative disc disease), lumbosacral 06/23/2012   Fibromyalgia syndrome 06/23/2012   Essential hypertension, benign 06/23/2012   Osteopenia 06/23/2012   Diabetes ([place]) 06/23/2012   Obesity due to excess calories 10/29/2010   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   PERSONAL HISTORY OF COLONIC POLYPS 10/11/2009    Samariah Hokenson, [place], PT 04/01/2021, 12:43 PM  [facility] [address] [place], [place], [address] Phone: [phone]   Fax:  [phone]  Name: [name] MRN: [id number] Date of Birth: Dec 17, 1945

## 2021-04-03 ENCOUNTER — Other Ambulatory Visit: Payer: Self-pay

## 2021-04-03 ENCOUNTER — Ambulatory Visit: Payer: Medicare Other | Admitting: *Deleted

## 2021-04-03 DIAGNOSIS — M25661 Stiffness of right knee, not elsewhere classified: Secondary | ICD-10-CM | POA: Diagnosis not present

## 2021-04-03 DIAGNOSIS — M545 Low back pain, unspecified: Secondary | ICD-10-CM | POA: Diagnosis not present

## 2021-04-03 DIAGNOSIS — G8929 Other chronic pain: Secondary | ICD-10-CM | POA: Diagnosis not present

## 2021-04-03 DIAGNOSIS — M25561 Pain in right knee: Secondary | ICD-10-CM | POA: Diagnosis not present

## 2021-04-03 NOTE — Therapy (Signed)
[facility] [place], [place], [address] Phone: [phone]   Fax:  [phone]  Physical Therapy Treatment  Patient Details  Name: [name] MRN: [id number] Date of Birth: 01-07-1946 Referring Provider (PT): Melina Schools MD   Encounter Date: 04/03/2021   PT End of Session - 04/03/21 1156     Visit Number 9    Number of Visits 12    Date for PT Re-Evaluation 04/09/21    PT Start Time 0945    PT Stop Time 1030    PT Time Calculation (min) 45 min             Past Medical History:  Diagnosis Date   Allergy    seasonal   Arthritis    [name]    Colon polyps    Complication of anesthesia    per pt, she woke up in the middle of last colon in 2014.   Depression    Diabetes mellitus, type 2 ([place]) 06/2010   Diabetic neuropathy ([name])    Diverticulosis    pt unaware   Esophageal stricture    Fibromyalgia    [name]    GERD (gastroesophageal reflux disease)    Hemorrhoids    Hiatal hernia    Hyperlipidemia    Hypertension    Menopause    Numbness    Peripheral vascular insufficiency (HCC)    Retinopathy    Bilateral   TIA (transient ischemic attack)    Tremor    Vitamin D deficiency     Past Surgical History:  Procedure Laterality Date   ABDOMINAL HYSTERECTOMY     partial and then total   ANKLE SURGERY Right    COLONOSCOPY     ELBOW SURGERY     left   KNEE ARTHROSCOPY Right 09/12/2018   Procedure: ARTHROSCOPY KNEE; SYNOVECTOMY;  Surgeon: Gaynelle Arabian, MD;  Location: [facility];  Service: Orthopedics;  Laterality: Right;  23min   REFRACTIVE SURGERY  2003   SPINAL CORD STIMULATOR INSERTION N/A 01/01/2021   Procedure: SPINAL CORD STIMULATOR PLACEMENT;  Surgeon: Melina Schools, MD;  Location: [name];  Service: Orthopedics;  Laterality: N/A;   TOTAL KNEE ARTHROPLASTY     bilateral   TOTAL KNEE REVISION Right 03/09/2018   Procedure: RIGHT TOTAL KNEE REVISION;  Surgeon: Gaynelle Arabian, MD;  Location:  [facility];  Service: Orthopedics;  Laterality: Right;  180min with abductor block   VESICOVAGINAL FISTULA CLOSURE W/ TAH  1981    There were no vitals filed for this visit.   Subjective Assessment - 04/03/21 1155     Subjective COVID-19 screen performed prior to patient entering clinic.  Feels better.  Most pain on left.    Pertinent History Bilateral TKA's, HTN, TIA, Fibromyalgia, DM.    How long can you sit comfortably? Unlimited.    How long can you stand comfortably? Varies.    How long can you walk comfortably? Short community distances.    Patient Stated Goals Decrease pain and be able to walk and stand longer.    Currently in Pain? Yes    Pain Score 2     Pain Location Back    Pain Orientation Left    Pain Descriptors / Indicators Throbbing    Pain Type Chronic pain    Pain Onset More than a month ago  [name] Adult PT Treatment/Exercise - 04/03/21 0001       Modalities   Modalities Moist Heat      Moist Heat Therapy   Number Minutes Moist Heat 10 Minutes    Moist Heat Location Lumbar Spine      Manual Therapy   Manual Therapy Soft tissue mobilization    Soft tissue mobilization Prone over pillow for comfort:  STW/M  to patient's  low back paras, glutes  and SIJ                          PT Long Term Goals - 02/26/21 1127       PT LONG TERM GOAL #1   Title Ind with an HEP.    Time 6    Period Weeks    Status New      PT LONG TERM GOAL #2   Title Stand 20 minutes with LBP level not > 4/10.    Time 6    Period Weeks    Status New      PT LONG TERM GOAL #3   Title Walk a community distance with pain not > 4/10.    Time 6    Period Weeks    Status New                   Plan - 04/03/21 1158     Clinical Impression Statement Pt arrived today doing better with decreased pain. STW focused on Bil. LB paras and LT SIJ with Pt prone over pillows.Decreased mm tightness after session.     Personal Factors and Comorbidities Comorbidity 1;Other    Comorbidities Bilateral TKA's, HTN, TIA, Fibromyalgia, DM.    Examination-Activity Limitations Locomotion Level;Stand    Examination-Participation Restrictions Other;Meal Prep;Cleaning    Stability/Clinical Decision Making Evolving/Moderate complexity    Rehab Potential Good    PT Frequency 2x / week    PT Duration 6 weeks    PT Treatment/Interventions ADLs/Self Care Home Management;Moist Heat;Therapeutic activities;Therapeutic exercise;Manual techniques;Patient/family education;Passive range of motion;Dry needling    PT Next Visit Plan STW/M and dry needling to patient's bilateral lumbar and lateral hip musculature.             Patient will benefit from skilled therapeutic intervention in order to improve the following deficits and impairments:  Pain, Abnormal gait, Decreased activity tolerance, Decreased strength, Increased muscle spasms  Visit Diagnosis: Chronic bilateral low back pain without sciatica     Problem List Patient Active Problem List   Diagnosis Date Noted   Chronic pain 01/01/2021   Paresthesia 06/14/2019   Diabetic autonomic neuropathy associated with type 2 diabetes mellitus ([name]) 06/14/2019   Failed total knee arthroplasty ([name]) 03/09/2018   Aortic atherosclerosis ([name]) 12/08/2017   Hardening of the aorta (main artery of the heart) ([name]) 12/08/2017   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[name]) 01/14/2015   Depression 02/28/2013   GERD (gastroesophageal reflux disease) 02/28/2013   Hiatal hernia with gastroesophageal reflux 02/28/2013   DDD (degenerative disc disease), lumbosacral 06/23/2012   Fibromyalgia syndrome 06/23/2012   Essential hypertension, benign 06/23/2012   Osteopenia 06/23/2012   Diabetes ([name]) 06/23/2012   Obesity due to excess calories 10/29/2010   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   PERSONAL HISTORY OF COLONIC POLYPS 10/11/2009    [name],CHRIS, PTA 04/03/2021, 12:06  PM  [name] [address] [name], [place], [address] Phone: [phone]   Fax:  [phone]  Name: [name] MRN:  [id number] Date of Birth: 23-Nov-1945

## 2021-04-08 ENCOUNTER — Ambulatory Visit: Payer: Medicare Other | Attending: Orthopedic Surgery | Admitting: Physical Therapy

## 2021-04-08 ENCOUNTER — Other Ambulatory Visit: Payer: Self-pay

## 2021-04-08 DIAGNOSIS — M545 Low back pain, unspecified: Secondary | ICD-10-CM | POA: Insufficient documentation

## 2021-04-08 DIAGNOSIS — M25661 Stiffness of right knee, not elsewhere classified: Secondary | ICD-10-CM | POA: Insufficient documentation

## 2021-04-08 DIAGNOSIS — M25561 Pain in right knee: Secondary | ICD-10-CM | POA: Diagnosis not present

## 2021-04-08 DIAGNOSIS — G8929 Other chronic pain: Secondary | ICD-10-CM | POA: Diagnosis not present

## 2021-04-08 NOTE — Therapy (Signed)
[facility] [place], [place], [address] Phone: [phone]   Fax:  [phone]  Physical Therapy Treatment  Patient Details  Name: [name] MRN: [id number] Date of Birth: [birth year] Referring Provider (PT): Melina Schools MD   Encounter Date: 04/08/2021   PT End of Session - 04/08/21 1035     Visit Number 10    Number of Visits 12    Date for PT Re-Evaluation 04/09/21    PT Start Time 0948    PT Stop Time 1024    PT Time Calculation (min) 36 min    Activity Tolerance Patient tolerated treatment well    Behavior During Therapy [facility] for tasks assessed/performed             Past Medical History:  Diagnosis Date   Allergy    seasonal   Arthritis    [place]    Colon polyps    Complication of anesthesia    per pt, she woke up in the middle of last colon in 2014.   Depression    Diabetes mellitus, type 2 ([place]) 06/2010   Diabetic neuropathy ([place])    Diverticulosis    pt unaware   Esophageal stricture    Fibromyalgia    [place]    GERD (gastroesophageal reflux disease)    Hemorrhoids    Hiatal hernia    Hyperlipidemia    Hypertension    Menopause    Numbness    Peripheral vascular insufficiency (HCC)    Retinopathy    Bilateral   TIA (transient ischemic attack)    Tremor    Vitamin D deficiency     Past Surgical History:  Procedure Laterality Date   ABDOMINAL HYSTERECTOMY     partial and then total   ANKLE SURGERY Right    COLONOSCOPY     ELBOW SURGERY     left   KNEE ARTHROSCOPY Right 09/12/2018   Procedure: ARTHROSCOPY KNEE; SYNOVECTOMY;  Surgeon: Gaynelle Arabian, MD;  [facility]: [facility];  Service: Orthopedics;  Laterality: Right;  17mn   REFRACTIVE SURGERY  2003   SPINAL CORD STIMULATOR INSERTION N/A 01/01/2021   Procedure: SPINAL CORD STIMULATOR PLACEMENT;  Surgeon: BMelina Schools MD;  [facility]  Service: Orthopedics;  Laterality: N/A;   TOTAL KNEE ARTHROPLASTY     bilateral    TOTAL KNEE REVISION Right 03/09/2018   Procedure: RIGHT TOTAL KNEE REVISION;  Surgeon: AGaynelle Arabian MD;  [facility];  Service: Orthopedics;  Laterality: Right;  1252m with abductor block   VESICOVAGINAL FISTULA CLOSURE W/ TAH  1981    There were no vitals filed for this visit.   Subjective Assessment - 04/08/21 1036     Subjective COVID-19 screen performed prior to patient entering clinic.  Bought a stimulator for foot neuropathy and it made her knees hurt badly.  Back better than it was.    Pertinent History Bilateral TKA's, HTN, TIA, Fibromyalgia, DM.    How long can you sit comfortably? Unlimited.    How long can you stand comfortably? Varies.    How long can you walk comfortably? Short community distances.    Patient Stated Goals Decrease pain and be able to walk and stand longer.    Currently in Pain? Yes    Pain Score 3     Pain Location Back    Pain Descriptors / Indicators Throbbing    Pain Type Chronic pain    Pain Onset More than a month ago  [facility] Adult PT Treatment/Exercise - 04/08/21 0001       Modalities   Modalities Moist Heat      Moist Heat Therapy   Number Minutes Moist Heat 8 Minutes    Moist Heat Location Lumbar Spine      Manual Therapy   Manual Therapy Soft tissue mobilization    Soft tissue mobilization Prone over pillow for comfort:  STW/M x 23 minutes to patient's lumbar musculature and left SIJ.                          PT Long Term Goals - 04/08/21 1038       PT LONG TERM GOAL #1   Title Ind with an HEP.    Time 6    Period Weeks    Status Achieved      PT LONG TERM GOAL #2   Title Stand 20 minutes with LBP level not > 4/10.    Time 6    Period Weeks    Status Partially Met      PT LONG TERM GOAL #3   Title Walk a community distance with pain not > 4/10.    Time 6    Period Weeks    Status Partially Met                   Plan - 04/08/21 1039      Clinical Impression Statement See "Therapy Note" section.    Personal Factors and Comorbidities Comorbidity 1;Other    Comorbidities Bilateral TKA's, HTN, TIA, Fibromyalgia, DM.    Examination-Activity Limitations Locomotion Level;Stand    Examination-Participation Restrictions Other;Meal Prep;Cleaning    Stability/Clinical Decision Making Evolving/Moderate complexity    Rehab Potential Good    PT Frequency 2x / week    PT Duration 6 weeks    PT Treatment/Interventions ADLs/Self Care Home Management;Moist Heat;Therapeutic activities;Therapeutic exercise;Manual techniques;Patient/family education;Passive range of motion;Dry needling    PT Next Visit Plan STW/M and dry needling to patient's bilateral lumbar and lateral hip musculature.    Consulted and Agree with Plan of Care Patient             Patient will benefit from skilled therapeutic intervention in order to improve the following deficits and impairments:  Pain, Abnormal gait, Decreased activity tolerance, Decreased strength, Increased muscle spasms  Visit Diagnosis: Chronic bilateral low back pain without sciatica  Chronic pain of right knee  Stiffness of right knee, not elsewhere classified     Problem List Patient Active Problem List   Diagnosis Date Noted   Chronic pain 01/01/2021   Paresthesia 06/14/2019   Diabetic autonomic neuropathy associated with type 2 diabetes mellitus ([place]) 06/14/2019   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   Aortic atherosclerosis ([place]) 12/08/2017   Hardening of the aorta (main artery of the heart) ([place]) 12/08/2017   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[place]) 01/14/2015   Depression 02/28/2013   GERD (gastroesophageal reflux disease) 02/28/2013   Hiatal hernia with gastroesophageal reflux 02/28/2013   DDD (degenerative disc disease), lumbosacral 06/23/2012   Fibromyalgia syndrome 06/23/2012   Essential hypertension, benign 06/23/2012   Osteopenia 06/23/2012   Diabetes ([place])  06/23/2012   Obesity due to excess calories 10/29/2010   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   PERSONAL HISTORY OF COLONIC POLYPS 10/11/2009   Progress Note Reporting Period 02/26/21 to 04/08/21  See note below for Objective Data and Assessment of Progress/Goals. Patient reporting an overall decrease in her low back pain-level.  LTG's generally partially met at this time.  She reports her knees are now more problematic than her low back.     Masae Lukacs, [place], PT 04/08/2021, 10:44 AM  [facility] [address] [place], [place], [address] Phone: [phone]   Fax:  [phone]  Name: LEXINE JASPERS MRN: [id number] Date of Birth: 03-09-1946

## 2021-04-10 ENCOUNTER — Encounter: Payer: Medicare Other | Admitting: *Deleted

## 2021-04-10 DIAGNOSIS — M25562 Pain in left knee: Secondary | ICD-10-CM | POA: Diagnosis not present

## 2021-04-10 DIAGNOSIS — Z96653 Presence of artificial knee joint, bilateral: Secondary | ICD-10-CM | POA: Diagnosis not present

## 2021-04-10 DIAGNOSIS — M25561 Pain in right knee: Secondary | ICD-10-CM | POA: Diagnosis not present

## 2021-04-15 ENCOUNTER — Other Ambulatory Visit: Payer: Self-pay

## 2021-04-15 ENCOUNTER — Ambulatory Visit: Payer: Medicare Other | Admitting: Physical Therapy

## 2021-04-15 ENCOUNTER — Encounter: Payer: Self-pay | Admitting: Physical Therapy

## 2021-04-15 DIAGNOSIS — G8929 Other chronic pain: Secondary | ICD-10-CM | POA: Diagnosis not present

## 2021-04-15 DIAGNOSIS — M25561 Pain in right knee: Secondary | ICD-10-CM | POA: Diagnosis not present

## 2021-04-15 DIAGNOSIS — M25661 Stiffness of right knee, not elsewhere classified: Secondary | ICD-10-CM | POA: Diagnosis not present

## 2021-04-15 DIAGNOSIS — M545 Low back pain, unspecified: Secondary | ICD-10-CM | POA: Diagnosis not present

## 2021-04-15 NOTE — Therapy (Signed)
[facility] [place], [place], [address] Phone: [phone]   Fax:  [phone]  Physical Therapy Treatment  Patient Details  Name: [name] MRN: [id number] Date of Birth: Mar 22, 1946 Referring Provider (PT): Melina Schools MD   Encounter Date: 04/15/2021   PT End of Session - 04/15/21 1016     Visit Number 11    Number of Visits 12    Date for PT Re-Evaluation 04/09/21    PT Start Time 0951    PT Stop Time 1027    PT Time Calculation (min) 36 min    Activity Tolerance Patient tolerated treatment well    Behavior During Therapy [facility] for tasks assessed/performed             Past Medical History:  Diagnosis Date   Allergy    seasonal   Arthritis    [place]    Colon polyps    Complication of anesthesia    per pt, she woke up in the middle of last colon in 2014.   Depression    Diabetes mellitus, type 2 ([place]) 06/2010   Diabetic neuropathy ([place])    Diverticulosis    pt unaware   Esophageal stricture    Fibromyalgia    [place]    GERD (gastroesophageal reflux disease)    Hemorrhoids    Hiatal hernia    Hyperlipidemia    Hypertension    Menopause    Numbness    Peripheral vascular insufficiency (HCC)    Retinopathy    Bilateral   TIA (transient ischemic attack)    Tremor    Vitamin D deficiency     Past Surgical History:  Procedure Laterality Date   ABDOMINAL HYSTERECTOMY     partial and then total   ANKLE SURGERY Right    COLONOSCOPY     ELBOW SURGERY     left   KNEE ARTHROSCOPY Right 09/12/2018   Procedure: ARTHROSCOPY KNEE; SYNOVECTOMY;  Surgeon: Gaynelle Arabian, MD;  [facility];  Service: Orthopedics;  Laterality: Right;  64mn   REFRACTIVE SURGERY  2003   SPINAL CORD STIMULATOR INSERTION N/A 01/01/2021   Procedure: SPINAL CORD STIMULATOR PLACEMENT;  Surgeon: BMelina Schools MD;  Location: [facility]  Service: Orthopedics;  Laterality: N/A;   TOTAL KNEE ARTHROPLASTY     bilateral    TOTAL KNEE REVISION Right 03/09/2018   Procedure: RIGHT TOTAL KNEE REVISION;  Surgeon: AGaynelle Arabian MD;  [facility];  Service: Orthopedics;  Laterality: Right;  1228m with abductor block   VESICOVAGINAL FISTULA CLOSURE W/ TAH  1981    There were no vitals filed for this visit.   Subjective Assessment - 04/15/21 0947     Subjective COVID-19 screen performed prior to patient entering clinic.  Has been released back to Dr. RaNelva Bushnd no known appt at this time.    Pertinent History Bilateral TKA's, HTN, TIA, Fibromyalgia, DM.    How long can you sit comfortably? Unlimited.    How long can you stand comfortably? Varies.    How long can you walk comfortably? 1 hr as long as she has a buggy to hold to    Patient Stated Goals Decrease pain and be able to walk and stand longer.    Currently in Pain? Yes    Pain Location Back                [facility] PT Assessment - 04/15/21 0001       Assessment  Medical Diagnosis Low back pain.    Referring Provider (PT) Melina Schools MD                           [facility] Adult PT Treatment/Exercise - 04/15/21 0001       Modalities   Modalities Moist Heat      Moist Heat Therapy   Number Minutes Moist Heat 10 Minutes    Moist Heat Location Lumbar Spine      Manual Therapy   Manual Therapy Soft tissue mobilization    Soft tissue mobilization Prone over pillow for comfort:  STW/M x 23 minutes to patient's lumbar musculature and left SIJ/glute                          PT Long Term Goals - 04/08/21 1038       PT LONG TERM GOAL #1   Title Ind with an HEP.    Time 6    Period Weeks    Status Achieved      PT LONG TERM GOAL #2   Title Stand 20 minutes with LBP level not > 4/10.    Time 6    Period Weeks    Status Partially Met      PT LONG TERM GOAL #3   Title Walk a community distance with pain not > 4/10.    Time 6    Period Weeks    Status Partially Met                   Plan -  04/15/21 1026     Clinical Impression Statement Patient continues to experience some L LBP but is continually adjusting the setting of the stimulator. Patient more tender to L glute just inferior the location the stimulator. Moderate muscle tightness palpable in L glute region. Normal moist heat tolerance.    Personal Factors and Comorbidities Comorbidity 1;Other    Comorbidities Bilateral TKA's, HTN, TIA, Fibromyalgia, DM.    Examination-Activity Limitations Locomotion Level;Stand    Examination-Participation Restrictions Other;Meal Prep;Cleaning    Stability/Clinical Decision Making Evolving/Moderate complexity    Rehab Potential Good    PT Frequency 2x / week    PT Duration 6 weeks    PT Treatment/Interventions ADLs/Self Care Home Management;Moist Heat;Therapeutic activities;Therapeutic exercise;Manual techniques;Patient/family education;Passive range of motion;Dry needling    PT Next Visit Plan STW/M and dry needling to patient's bilateral lumbar and lateral hip musculature.    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[place]) 01/14/2015   Depression 02/28/2013   GERD (gastroesophageal reflux disease) 02/28/2013   Hiatal hernia with gastroesophageal reflux 02/28/2013   DDD  (degenerative disc disease), lumbosacral 06/23/2012   Fibromyalgia syndrome 06/23/2012   Essential hypertension, benign  06/23/2012   Osteopenia 06/23/2012   Diabetes ([place]) 06/23/2012   Obesity due to excess calories 10/29/2010   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   PERSONAL HISTORY OF COLONIC POLYPS 10/11/2009    Standley Brooking, PTA 04/15/2021, 10:46 AM  [facility] [address] [place], [place], [address] Phone: [phone]   Fax:  [phone]  Name: [name] MRN: [id number] Date of Birth: [birth year]

## 2021-04-17 ENCOUNTER — Encounter: Payer: Medicare Other | Admitting: Physical Therapy

## 2021-04-22 ENCOUNTER — Other Ambulatory Visit: Payer: Self-pay

## 2021-04-22 ENCOUNTER — Ambulatory Visit: Payer: Medicare Other | Admitting: *Deleted

## 2021-04-22 DIAGNOSIS — G8929 Other chronic pain: Secondary | ICD-10-CM

## 2021-04-22 DIAGNOSIS — M25561 Pain in right knee: Secondary | ICD-10-CM | POA: Diagnosis not present

## 2021-04-22 DIAGNOSIS — M545 Low back pain, unspecified: Secondary | ICD-10-CM

## 2021-04-22 DIAGNOSIS — M25661 Stiffness of right knee, not elsewhere classified: Secondary | ICD-10-CM | POA: Diagnosis not present

## 2021-04-22 NOTE — Therapy (Signed)
[facility] [place], [place], [address] Phone: [phone]   Fax:  [phone]  Physical Therapy Treatment  Patient Details  Name: [name] MRN: [id number] Date of Birth: Jun 09, 1945 Referring Provider (PT): Melina Schools MD   Encounter Date: 04/22/2021   PT End of Session - 04/22/21 1010     Visit Number 12    Number of Visits 12    Date for PT Re-Evaluation 04/09/21    PT Start Time 0945    PT Stop Time 1010    PT Time Calculation (min) 25 min             Past Medical History:  Diagnosis Date   Allergy    seasonal   Arthritis    [place]    Colon polyps    Complication of anesthesia    per pt, she woke up in the middle of last colon in 2014.   Depression    Diabetes mellitus, type 2 ([place]) 06/2010   Diabetic neuropathy ([place])    Diverticulosis    pt unaware   Esophageal stricture    Fibromyalgia    [place]    GERD (gastroesophageal reflux disease)    Hemorrhoids    Hiatal hernia    Hyperlipidemia    Hypertension    Menopause    Numbness    Peripheral vascular insufficiency (HCC)    Retinopathy    Bilateral   TIA (transient ischemic attack)    Tremor    Vitamin D deficiency     Past Surgical History:  Procedure Laterality Date   ABDOMINAL HYSTERECTOMY     partial and then total   ANKLE SURGERY Right    COLONOSCOPY     ELBOW SURGERY     left   KNEE ARTHROSCOPY Right 09/12/2018   Procedure: ARTHROSCOPY KNEE; SYNOVECTOMY;  Surgeon: Gaynelle Arabian, MD;  Location: [facility];  Service: Orthopedics;  Laterality: Right;  54mn   REFRACTIVE SURGERY  2003   SPINAL CORD STIMULATOR INSERTION N/A 01/01/2021   Procedure: SPINAL CORD STIMULATOR PLACEMENT;  Surgeon: BMelina Schools MD;  [place] [facility]  Service: Orthopedics;  Laterality: N/A;   TOTAL KNEE ARTHROPLASTY     bilateral   TOTAL KNEE REVISION Right 03/09/2018   Procedure: RIGHT TOTAL KNEE REVISION;  Surgeon: AGaynelle Arabian MD;  Location:  [facility];  Service: Orthopedics;  Laterality: Right;  1265m with abductor block   VESICOVAGINAL FISTULA CLOSURE W/ TAH  1981    There were no vitals filed for this visit.   Subjective Assessment - 04/22/21 0950     Subjective COVID-19 screen performed prior to patient entering clinic. Back is a little better. RT knee hurts today. Not feeling good today    Pertinent History Bilateral TKA's, HTN, TIA, Fibromyalgia, DM.    How long can you sit comfortably? Unlimited.    How long can you stand comfortably? Varies.    How long can you walk comfortably? 1 hr as long as she has a buggy to hold to    Patient Stated Goals Decrease pain and be able to walk and stand longer.    Currently in Pain? Yes    Pain Score 2     Pain Location Back                               [facility] Adult PT Treatment/Exercise - 04/22/21 0001       Manual  Therapy   Manual Therapy Soft tissue mobilization    Soft tissue mobilization Prone over pillow for comfort:  STW/M x 15 minutes to patient's lumbar musculature and left SIJ/glute                          PT Long Term Goals - 04/22/21 1003       PT LONG TERM GOAL #1   Title Ind with an HEP.    Time 6    Period Weeks    Status Achieved      PT LONG TERM GOAL #2   Title Stand 20 minutes with LBP level not > 4/10.    Time 6    Period Weeks    Status Partially Met   Pain is less, but >4/10     PT LONG TERM GOAL #3   Title Walk a community distance with pain not > 4/10.    Time 6    Period Weeks    Status Partially Met   NM less pain, but > 4/10 still                  Plan - 04/22/21 1016     Clinical Impression Statement Pt arrived with decreased LBP today 2-3/10, but with high knee pain and not feeling well. Manual STW was performed to LB paras and glutes and had minimal soreness today. Rx was ended early due to Pt not feeling well. HEP LTG met, but others NM due to pain with walking and stand > than 20  mins pain increases > 4/10. DC to HEP    Personal Factors and Comorbidities Comorbidity 1;Other    Comorbidities Bilateral TKA's, HTN, TIA, Fibromyalgia, DM.    Examination-Activity Limitations Locomotion Level;Stand    Examination-Participation Restrictions Other;Meal Prep;Cleaning    Stability/Clinical Decision Making Evolving/Moderate complexity    Rehab Potential Good    PT Frequency 2x / week    PT Treatment/Interventions ADLs/Self Care Home Management;Moist Heat;Therapeutic activities;Therapeutic exercise;Manual techniques;Patient/family education;Passive range of motion;Dry needling    PT Next Visit Plan DC to HEP    Consulted and Agree with Plan of Care Patient             Patient will benefit from skilled therapeutic intervention in order to improve the following deficits and impairments:  Pain, Abnormal gait, Decreased activity tolerance, Decreased strength, Increased muscle spasms  Visit Diagnosis: Chronic bilateral low back pain without sciatica     Problem List Patient Active Problem List   Diagnosis Date Noted   Chronic pain 01/01/2021   Paresthesia 06/14/2019   Diabetic autonomic neuropathy associated with type 2 diabetes mellitus ([place]) 06/14/2019   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   Aortic atherosclerosis ([place]) 12/08/2017   Hardening of the aorta (main artery of the heart) ([place]) 12/08/2017   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[place]) 01/14/2015   Depression 02/28/2013   GERD (gastroesophageal reflux disease) 02/28/2013   Hiatal hernia with gastroesophageal reflux 02/28/2013   DDD (degenerative disc disease), lumbosacral 06/23/2012   Fibromyalgia syndrome 06/23/2012   Essential hypertension, benign 06/23/2012   Osteopenia 06/23/2012   Diabetes ([place]) 06/23/2012   Obesity due to excess calories 10/29/2010   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   PERSONAL HISTORY OF COLONIC POLYPS 10/11/2009    Katniss Weedman,CHRIS, PTA 04/22/2021, 10:22  AM  [facility] [address] [place], [place], [address] Phone: [phone]   Fax:  [phone]  Name: [name] MRN: [id number]  Date of Birth: Mar 22, 1946   PHYSICAL THERAPY DISCHARGE SUMMARY  Visits from Start of Care: 12.  Current functional level related to goals / functional outcomes: See above.   Remaining deficits: See goal section.   Education / Equipment: HEP.   Patient agrees to discharge. Patient goals were partially met. Patient is being discharged due to lack of progress.    Mali Applegate MPT

## 2021-05-16 ENCOUNTER — Telehealth: Payer: Self-pay

## 2021-05-16 ENCOUNTER — Ambulatory Visit (INDEPENDENT_AMBULATORY_CARE_PROVIDER_SITE_OTHER): Payer: Medicare Other | Admitting: Family Medicine

## 2021-05-16 ENCOUNTER — Encounter: Payer: Self-pay | Admitting: Family Medicine

## 2021-05-16 VITALS — BP 118/63 | HR 104 | Ht 66.0 in | Wt 203.0 lb

## 2021-05-16 DIAGNOSIS — Z01818 Encounter for other preprocedural examination: Secondary | ICD-10-CM

## 2021-05-16 LAB — BAYER DCA HB A1C WAIVED: HB A1C (BAYER DCA - WAIVED): 6.7 % — ABNORMAL HIGH (ref 4.8–5.6)

## 2021-05-16 LAB — COAGUCHEK XS/INR WAIVED
INR: 0.9 (ref 0.9–1.1)
Prothrombin Time: 12.3 s

## 2021-05-16 NOTE — Progress Notes (Signed)
BP 118/63    Pulse (!) 104    Ht 5' 6"  (1.676 m)    Wt 203 lb (92.1 kg)    SpO2 97%    BMI 32.77 kg/m    Subjective:   [name], female    DOB: 08/08/1945, 76 y.o.   MRN: [id number]  HPI: [name] is a 76 y.o. female presenting on 05/16/2021 for Surgical clearance   HPI Preoperative clearance Patient is coming in for preoperative clearance for right knee arthroscopy and synovectomy.  She does get some short of breath with walking distances but a lot of that is from deconditioning.  She denies any chest pain or palpitations with that short of breath but more just on exertion she does get some short of breath.  Relevant past medical, surgical, family and social history reviewed and updated as indicated. Interim medical history since our last visit reviewed. Allergies and medications reviewed and updated.  Review of Systems  Constitutional:  Negative for chills and fever.  HENT:  Negative for congestion, ear discharge and ear pain.   Eyes:  Negative for redness and visual disturbance.  Respiratory:  Positive for shortness of breath (Exertional). Negative for chest tightness.   Cardiovascular:  Negative for chest pain and leg swelling.  Genitourinary:  Negative for difficulty urinating and dysuria.  Musculoskeletal:  Negative for back pain and gait problem.  Skin:  Negative for rash.  Neurological:  Negative for light-headedness and headaches.  Psychiatric/Behavioral:  Negative for agitation and behavioral problems.   All other systems reviewed and are negative.  Per HPI unless specifically indicated above   Allergies as of 05/16/2021       Reactions   Penicillins Other (See Comments)   Blisters in mouth Did it involve swelling of the face/tongue/throat, SOB, or low BP? No Did it involve sudden or severe rash/hives, skin peeling, or any reaction on the inside of your mouth or nose? No Did you need to seek medical attention at a hospital or doctor's office?  No When did it last happen?      Childhood allergy If all above answers are "NO", may proceed with cephalosporin use.   Codeine Nausea And Vomiting   Erythromycin Nausea And Vomiting   Fenofibrate Other (See Comments)   aching & edema    Lyrica [pregabalin] Other (See Comments)   Edema in feet   Statins Other (See Comments)   Joints ache   Sulfa Antibiotics Nausea And Vomiting        Medication List        Accurate as of May 16, 2021  1:14 PM. If you have any questions, ask your nurse or doctor.          ALPRAZolam 0.5 MG tablet Commonly known as: XANAX Take 1-2 tablets (0.5-1 mg total) by mouth daily as needed for anxiety. Take 1-2 tabs daily as needed   ARIPiprazole 2 MG tablet Commonly known as: ABILIFY Take 1 tablet (2 mg total) by mouth daily. What changed:  how much to take when to take this   atorvastatin 10 MG tablet Commonly known as: LIPITOR Take 1 tablet (10 mg total) by mouth daily.   blood glucose meter kit and supplies Kit Dispense based on patient and insurance preference. Use up to two times daily as directed. (Dx: type 2 DM - E11.9)   clopidogrel 75 MG tablet Commonly known as: PLAVIX Take 1 tablet (75 mg total) by mouth daily.   docusate  sodium 100 MG capsule Commonly known as: COLACE Take 1 capsule (100 mg total) by mouth every 12 (twelve) hours.   escitalopram 20 MG tablet Commonly known as: LEXAPRO Take 1 tablet (20 mg total) by mouth daily.   ezetimibe 10 MG tablet Commonly known as: ZETIA Take 1 tablet (10 mg total) by mouth daily.   Fish Oil 1000 MG Caps Take 1,000 mg by mouth daily.   gabapentin 600 MG tablet Commonly known as: NEURONTIN Take 1 tablet (600 mg total) by mouth in the morning, at noon, in the evening, and at bedtime.   glipiZIDE 5 MG 24 hr tablet Commonly known as: GLUCOTROL XL Take 1 tablet (5 mg total) by mouth daily with breakfast.   lisinopril-hydrochlorothiazide 20-25 MG tablet Commonly known as:  ZESTORETIC Take 1 tablet by mouth daily.   meclizine 25 MG tablet Commonly known as: ANTIVERT TAKE ONE TABLET THREE TIMES DAILY AS NEEDED FOR DIZZINESS   methocarbamol 500 MG tablet Commonly known as: ROBAXIN Take 1 tablet (500 mg total) by mouth 2 (two) times daily as needed for muscle spasms.   nicotine polacrilex 2 MG gum Commonly known as: NICORETTE Take 2 mg by mouth as needed for smoking cessation.   ondansetron 4 MG tablet Commonly known as: Zofran Take 1 tablet (4 mg total) by mouth every 8 (eight) hours as needed for nausea or vomiting.   ONE TOUCH ULTRA 2 w/Device Kit CHECK BLOOD SUGAR UP TO TWICE DAILY Dx X72.6   OneTouch Delica Lancets 20B Misc CHECK BLOOD SUGAR UP TO TWICE DAILY Dx E11.9   OneTouch Ultra test strip Generic drug: glucose blood Check BS up to 2 times daily Dx 11.43   Ozempic (1 MG/DOSE) 4 MG/3ML Sopn Generic drug: Semaglutide (1 MG/DOSE) Inject 1 mg into the skin once a week. What changed: when to take this   pantoprazole 40 MG tablet Commonly known as: PROTONIX Take 1 tablet (40 mg total) by mouth daily.   polyethylene glycol 17 g packet Commonly known as: MIRALAX / GLYCOLAX Take 17 g by mouth 2 (two) times daily as needed (constipation).   Premarin vaginal cream Generic drug: conjugated estrogens Place vaginally at bedtime. What changed:  how much to take when to take this reasons to take this   Synjardy XR 25-1000 MG Tb24 Generic drug: Empagliflozin-metFORMIN HCl ER Take 1 tablet by mouth daily.   Vitamin D 50 MCG (2000 UT) tablet Take 2,000 Units by mouth daily.         Objective:   BP 118/63    Pulse (!) 104    Ht 5' 6"  (1.676 m)    Wt 203 lb (92.1 kg)    SpO2 97%    BMI 32.77 kg/m   Wt Readings from Last 3 Encounters:  05/16/21 203 lb (92.1 kg)  03/28/21 201 lb (91.2 kg)  01/03/21 203 lb 0.7 oz (92.1 kg)    Physical Exam Vitals and nursing note reviewed.  Constitutional:      General: She is not in acute  distress.    Appearance: She is well-developed. She is not diaphoretic.  Eyes:     Conjunctiva/sclera: Conjunctivae normal.     Pupils: Pupils are equal, round, and reactive to light.  Cardiovascular:     Rate and Rhythm: Normal rate and regular rhythm.     Heart sounds: Normal heart sounds. No murmur heard. Pulmonary:     Effort: Pulmonary effort is normal. No respiratory distress.     Breath sounds: Normal  breath sounds. No wheezing.  Musculoskeletal:        General: No tenderness. Normal range of motion.  Skin:    General: Skin is warm and dry.     Findings: No rash.  Neurological:     Mental Status: She is alert and oriented to person, place, and time.     Coordination: Coordination normal.  Psychiatric:        Behavior: Behavior normal.      Assessment & Plan:   Problem List Items Addressed This Visit   None Visit Diagnoses     Preop examination    -  Primary   Relevant Orders   CBC with Differential/Platelet   CMP14+EGFR   CoaguChek XS/INR Waived   Bayer DCA Hb A1c Waived     Recommend to stop Plavix and fish oil for 5 days before surgery.  Otherwise cleared for surgery.  As long as blood work comes back good.  Follow up plan: Return if symptoms worsen or fail to improve.  Counseling provided for all of the vaccine components Orders Placed This Encounter  Procedures   CBC with Differential/Platelet   CMP14+EGFR   CoaguChek XS/INR Waived   Bayer [facility] Hb A1c Waived    Mary Pina, MD [facility] 05/16/2021, 1:14 PM

## 2021-05-16 NOTE — Telephone Encounter (Signed)
Faxed surgical clearance form to [employer] @ [phone]. Original sent to be scanned.

## 2021-05-17 LAB — CBC WITH DIFFERENTIAL/PLATELET
Basophils Absolute: 0.1 10*3/uL (ref 0.0–0.2)
Basos: 1 %
EOS (ABSOLUTE): 0.2 10*3/uL (ref 0.0–0.4)
Eos: 3 %
Hematocrit: 37.3 % (ref 34.0–46.6)
Hemoglobin: 11.4 g/dL (ref 11.1–15.9)
Immature Grans (Abs): 0 10*3/uL (ref 0.0–0.1)
Immature Granulocytes: 0 %
Lymphocytes Absolute: 2.5 10*3/uL (ref 0.7–3.1)
Lymphs: 35 %
MCH: 22.5 pg — ABNORMAL LOW (ref 26.6–33.0)
MCHC: 30.6 g/dL — ABNORMAL LOW (ref 31.5–35.7)
MCV: 74 fL — ABNORMAL LOW (ref 79–97)
Monocytes Absolute: 0.9 10*3/uL (ref 0.1–0.9)
Monocytes: 13 %
Neutrophils Absolute: 3.4 10*3/uL (ref 1.4–7.0)
Neutrophils: 48 %
Platelets: 288 10*3/uL (ref 150–450)
RBC: 5.06 x10E6/uL (ref 3.77–5.28)
RDW: 16.9 % — ABNORMAL HIGH (ref 11.7–15.4)
WBC: 7.2 10*3/uL (ref 3.4–10.8)

## 2021-05-17 LAB — CMP14+EGFR
ALT: 25 IU/L (ref 0–32)
AST: 23 IU/L (ref 0–40)
Albumin/Globulin Ratio: 1.7 (ref 1.2–2.2)
Albumin: 4.2 g/dL (ref 3.7–4.7)
Alkaline Phosphatase: 89 IU/L (ref 44–121)
BUN/Creatinine Ratio: 14 (ref 12–28)
BUN: 10 mg/dL (ref 8–27)
Bilirubin Total: 0.2 mg/dL (ref 0.0–1.2)
CO2: 25 mmol/L (ref 20–29)
Calcium: 9.4 mg/dL (ref 8.7–10.3)
Chloride: 98 mmol/L (ref 96–106)
Creatinine, Ser: 0.71 mg/dL (ref 0.57–1.00)
Globulin, Total: 2.5 g/dL (ref 1.5–4.5)
Glucose: 106 mg/dL — ABNORMAL HIGH (ref 70–99)
Potassium: 4.9 mmol/L (ref 3.5–5.2)
Sodium: 137 mmol/L (ref 134–144)
Total Protein: 6.7 g/dL (ref 6.0–8.5)
eGFR: 89 mL/min/{1.73_m2} (ref >59)

## 2021-05-27 DIAGNOSIS — Z79891 Long term (current) use of opiate analgesic: Secondary | ICD-10-CM | POA: Diagnosis not present

## 2021-05-27 DIAGNOSIS — M5136 Other intervertebral disc degeneration, lumbar region: Secondary | ICD-10-CM | POA: Diagnosis not present

## 2021-05-27 DIAGNOSIS — M48062 Spinal stenosis, lumbar region with neurogenic claudication: Secondary | ICD-10-CM | POA: Diagnosis not present

## 2021-05-29 DIAGNOSIS — H04123 Dry eye syndrome of bilateral lacrimal glands: Secondary | ICD-10-CM | POA: Diagnosis not present

## 2021-05-29 DIAGNOSIS — H524 Presbyopia: Secondary | ICD-10-CM | POA: Diagnosis not present

## 2021-05-29 DIAGNOSIS — H5203 Hypermetropia, bilateral: Secondary | ICD-10-CM | POA: Diagnosis not present

## 2021-05-29 DIAGNOSIS — H52223 Regular astigmatism, bilateral: Secondary | ICD-10-CM | POA: Diagnosis not present

## 2021-05-30 ENCOUNTER — Encounter: Payer: Self-pay | Admitting: Family Medicine

## 2021-05-30 ENCOUNTER — Ambulatory Visit (INDEPENDENT_AMBULATORY_CARE_PROVIDER_SITE_OTHER): Payer: Medicare Other | Admitting: Family Medicine

## 2021-05-30 DIAGNOSIS — U071 COVID-19: Secondary | ICD-10-CM

## 2021-05-30 MED ORDER — MOLNUPIRAVIR EUA 200MG CAPSULE: 4.0000 | ORAL_CAPSULE | Freq: Two times a day (BID) | ORAL | 0 refills | Status: AC

## 2021-05-30 NOTE — Progress Notes (Signed)
Virtual Visit via Telephone Note  I connected with [name] on 05/30/21 at 3:24 PM by telephone and verified that I am speaking with the correct person using two identifiers. [name] is currently located at home and nobody is currently with her during this visit. The provider, Loman Brooklyn, FNP is located in their office at time of visit.  I discussed the limitations, risks, security and privacy concerns of performing an evaluation and management service by telephone and the availability of in person appointments. I also discussed with the patient that there may be a patient responsible charge related to this service. The patient expressed understanding and agreed to proceed.  Subjective: PCP: Dettinger, Fransisca Kaufmann, MD  Chief Complaint  Patient presents with   Covid Positive   Patient complains of cough, head congestion, headache, runny nose, sneezing, sore throat, postnasal drainage, and body aches . Onset of symptoms was 1 day ago, gradually worsening since that time. She is drinking plenty of fluids. Evaluation to date: at home COVID test positive x2. Treatment to date: none. She does not smoke.    ROS: Per HPI  Current Outpatient Medications:    ALPRAZolam (XANAX) 0.5 MG tablet, Take 1-2 tablets (0.5-1 mg total) by mouth daily as needed for anxiety. Take 1-2 tabs daily as needed, Disp: 30 tablet, Rfl: 2   ARIPiprazole (ABILIFY) 2 MG tablet, Take 1 tablet (2 mg total) by mouth daily. (Patient taking differently: Take 1 mg by mouth at bedtime.), Disp: 90 tablet, Rfl: 3   atorvastatin (LIPITOR) 10 MG tablet, Take 1 tablet (10 mg total) by mouth daily., Disp: 90 tablet, Rfl: 3   blood glucose meter kit and supplies KIT, Dispense based on patient and insurance preference. Use up to two times daily as directed. (Dx: type 2 DM - E11.9), Disp: 1 each, Rfl: 0   Blood Glucose Monitoring Suppl (ONE TOUCH ULTRA 2) w/Device KIT, CHECK BLOOD SUGAR UP TO TWICE DAILY Dx E11.9, Disp: 1 kit,  Rfl: 0   Cholecalciferol (VITAMIN D) 50 MCG (2000 UT) tablet, Take 2,000 Units by mouth daily. , Disp: , Rfl:    clopidogrel (PLAVIX) 75 MG tablet, Take 1 tablet (75 mg total) by mouth daily., Disp: 90 tablet, Rfl: 3   conjugated estrogens (PREMARIN) vaginal cream, Place vaginally at bedtime. (Patient taking differently: Place 1 applicator vaginally daily as needed (dryness).), Disp: 30 g, Rfl: 3   docusate sodium (COLACE) 100 MG capsule, Take 1 capsule (100 mg total) by mouth every 12 (twelve) hours., Disp: 60 capsule, Rfl: 0   Empagliflozin-metFORMIN HCl ER (SYNJARDY XR) 25-1000 MG TB24, Take 1 tablet by mouth daily., Disp: 90 tablet, Rfl: 3   escitalopram (LEXAPRO) 20 MG tablet, Take 1 tablet (20 mg total) by mouth daily., Disp: 90 tablet, Rfl: 3   ezetimibe (ZETIA) 10 MG tablet, Take 1 tablet (10 mg total) by mouth daily., Disp: 90 tablet, Rfl: 3   gabapentin (NEURONTIN) 600 MG tablet, Take 1 tablet (600 mg total) by mouth in the morning, at noon, in the evening, and at bedtime., Disp: 360 tablet, Rfl: 3   glipiZIDE (GLUCOTROL XL) 5 MG 24 hr tablet, Take 1 tablet (5 mg total) by mouth daily with breakfast., Disp: 90 tablet, Rfl: 3   glucose blood (ONETOUCH ULTRA) test strip, Check BS up to 2 times daily Dx 11.43, Disp: 200 strip, Rfl: 3   lisinopril-hydrochlorothiazide (ZESTORETIC) 20-25 MG tablet, Take 1 tablet by mouth daily., Disp: 90 tablet, Rfl: 3  meclizine (ANTIVERT) 25 MG tablet, TAKE ONE TABLET THREE TIMES DAILY AS NEEDED FOR DIZZINESS, Disp: 30 tablet, Rfl: 1   methocarbamol (ROBAXIN) 500 MG tablet, Take 1 tablet (500 mg total) by mouth 2 (two) times daily as needed for muscle spasms., Disp: 10 tablet, Rfl: 0   nicotine polacrilex (NICORETTE) 2 MG gum, Take 2 mg by mouth as needed for smoking cessation., Disp: , Rfl:    Omega-3 Fatty Acids (FISH OIL) 1000 MG CAPS, Take 1,000 mg by mouth daily., Disp: , Rfl:    ondansetron (ZOFRAN) 4 MG tablet, Take 1 tablet (4 mg total) by mouth every  8 (eight) hours as needed for nausea or vomiting., Disp: 20 tablet, Rfl: 0   OneTouch Delica Lancets 92Z MISC, CHECK BLOOD SUGAR UP TO TWICE DAILY Dx E11.9, Disp: 200 each, Rfl: 3   pantoprazole (PROTONIX) 40 MG tablet, Take 1 tablet (40 mg total) by mouth daily., Disp: 90 tablet, Rfl: 3   polyethylene glycol (MIRALAX / GLYCOLAX) 17 g packet, Take 17 g by mouth 2 (two) times daily as needed (constipation)., Disp: 14 each, Rfl: 0   Semaglutide, 1 MG/DOSE, (OZEMPIC, 1 MG/DOSE,) 4 MG/3ML SOPN, Inject 1 mg into the skin once a week. (Patient taking differently: Inject 1 mg into the skin every Sunday.), Disp: 3 mL, Rfl: 5  Allergies  Allergen Reactions   Penicillins Other (See Comments)    Blisters in mouth Did it involve swelling of the face/tongue/throat, SOB, or low BP? No Did it involve sudden or severe rash/hives, skin peeling, or any reaction on the inside of your mouth or nose? No Did you need to seek medical attention at a hospital or doctor's office? No When did it last happen?      Childhood allergy If all above answers are "NO", may proceed with cephalosporin use.    Codeine Nausea And Vomiting   Erythromycin Nausea And Vomiting   Fenofibrate Other (See Comments)    aching & edema     Lyrica [Pregabalin] Other (See Comments)    Edema in feet    Statins Other (See Comments)    Joints ache   Sulfa Antibiotics Nausea And Vomiting   Past Medical History:  Diagnosis Date   Allergy    seasonal   Arthritis    [place]    Colon polyps    Complication of anesthesia    per pt, she woke up in the middle of last colon in 2014.   Depression    Diabetes mellitus, type 2 ([place]) 06/2010   Diabetic neuropathy ([place])    Diverticulosis    pt unaware   Esophageal stricture    Fibromyalgia    [place]    GERD (gastroesophageal reflux disease)    Hemorrhoids    Hiatal hernia    Hyperlipidemia    Hypertension    Menopause    Numbness    Peripheral vascular insufficiency (HCC)     Retinopathy    Bilateral   TIA (transient ischemic attack)    Tremor    Vitamin D deficiency     Observations/Objective: A&O  No respiratory distress or wheezing audible over the phone Mood, judgement, and thought processes all WNL  Assessment and Plan: 1. COVID-19 Discussed symptom management.  - molnupiravir EUA (LAGEVRIO) 200 mg CAPS capsule; Take 4 capsules (800 mg total) by mouth 2 (two) times daily for 5 days.  Dispense: 40 capsule; Refill: 0   Follow Up Instructions:  I discussed the assessment and treatment plan with  the patient. The patient was provided an opportunity to ask questions and all were answered. The patient agreed with the plan and demonstrated an understanding of the instructions.   The patient was advised to call back or seek an in-person evaluation if the symptoms worsen or if the condition fails to improve as anticipated.  The above assessment and management plan was discussed with the patient. The patient verbalized understanding of and has agreed to the management plan. Patient is aware to call the clinic if symptoms persist or worsen. Patient is aware when to return to the clinic for a follow-up visit. Patient educated on when it is appropriate to go to the emergency department.   Time call ended: 3:35 PM  I provided 11 minutes of non-face-to-face time during this encounter.  Hendricks Limes, MSN, APRN, FNP-C [facility] 05/30/21

## 2021-06-06 ENCOUNTER — Telehealth: Payer: Self-pay | Admitting: Family Medicine

## 2021-06-06 NOTE — Telephone Encounter (Signed)
Left message for patient to call back and schedule Medicare Annual Wellness Visit (AWV) to be completed by video or phone.  ? ?Last AWV: 05/31/2020 ? ?Please schedule at anytime with [place] ? ?45 minute appointment ? ?Any questions, please contact me at [phone]  ?  ?  ?

## 2021-06-18 ENCOUNTER — Other Ambulatory Visit: Payer: Self-pay | Admitting: Family Medicine

## 2021-06-18 DIAGNOSIS — F320 Major depressive disorder, single episode, mild: Secondary | ICD-10-CM

## 2021-06-20 ENCOUNTER — Other Ambulatory Visit: Payer: Self-pay | Admitting: Family Medicine

## 2021-06-20 DIAGNOSIS — F320 Major depressive disorder, single episode, mild: Secondary | ICD-10-CM

## 2021-07-16 ENCOUNTER — Ambulatory Visit (INDEPENDENT_AMBULATORY_CARE_PROVIDER_SITE_OTHER): Payer: Medicare Other

## 2021-07-16 VITALS — Ht 66.0 in | Wt 203.0 lb

## 2021-07-16 DIAGNOSIS — Z0000 Encounter for general adult medical examination without abnormal findings: Secondary | ICD-10-CM

## 2021-07-16 DIAGNOSIS — Z87891 Personal history of nicotine dependence: Secondary | ICD-10-CM

## 2021-07-16 NOTE — Patient Instructions (Signed)
[name] , ?Thank you for taking time to come for your Medicare Wellness Visit. I appreciate your ongoing commitment to your health goals. Please review the following plan we discussed and let me know if I can assist you in the future.  ? ?Screening recommendations/referrals: ?Colonoscopy: Done 11/16/2018. No longer required. ?Mammogram: Done 2022. Repeat annually ? ?Bone Density: Done 01/25/2020. Repeat every 2 years ? ?Recommended yearly ophthalmology/optometry visit for glaucoma screening and checkup ?Recommended yearly dental visit for hygiene and checkup ? ?Vaccinations: ?Influenza vaccine: Done 02/01/2021 Repeat annually ? ?Pneumococcal vaccine: Done 02/28/2013, 02/05/2011 ?Tdap vaccine: Done 09/03/2016 Repeat in 10 years ? ?Shingles vaccine: Done 02/06/2019, 11/26/2018 and 08/08/2009.   ?Covid-19:Done 09/24/2020, 05/17/2019 and 04/26/2019. ? ?Advanced directives: Please bring a copy of your health care power of attorney and living will to the office to be added to your chart at your convenience. ? ? ?Conditions/risks identified: Aim for 30 minutes of exercise, 6-8 glasses of water, and 5 servings of fruits and vegetables each day. ? ? ?Next appointment: Follow up in one year for your annual wellness visit 2024. ? ? ?Preventive Care 39 Years and Older, Female ?Preventive care refers to lifestyle choices and visits with your health care provider that can promote health and wellness. ?What does preventive care include? ?A yearly physical exam. This is also called an annual well check. ?Dental exams once or twice a year. ?Routine eye exams. Ask your health care provider how often you should have your eyes checked. ?Personal lifestyle choices, including: ?Daily care of your teeth and gums. ?Regular physical activity. ?Eating a healthy diet. ?Avoiding tobacco and drug use. ?Limiting alcohol use. ?Practicing safe sex. ?Taking low-dose aspirin every day. ?Taking vitamin and mineral supplements as recommended by your health  care provider. ?What happens during an annual well check? ?The services and screenings done by your health care provider during your annual well check will depend on your age, overall health, lifestyle risk factors, and family history of disease. ?Counseling  ?Your health care provider may ask you questions about your: ?Alcohol use. ?Tobacco use. ?Drug use. ?Emotional well-being. ?Home and relationship well-being. ?Sexual activity. ?Eating habits. ?History of falls. ?Memory and ability to understand (cognition). ?Work and work Statistician. ?Reproductive health. ?Screening  ?You may have the following tests or measurements: ?Height, weight, and BMI. ?Blood pressure. ?Lipid and cholesterol levels. These may be checked every 5 years, or more frequently if you are over 66 years old. ?Skin check. ?Lung cancer screening. You may have this screening every year starting at age 57 if you have a 30-pack-year history of smoking and currently smoke or have quit within the past 15 years. ?Fecal occult blood test (FOBT) of the stool. You may have this test every year starting at age 53. ?Flexible sigmoidoscopy or colonoscopy. You may have a sigmoidoscopy every 5 years or a colonoscopy every 10 years starting at age 97. ?Hepatitis C blood test. ?Hepatitis B blood test. ?Sexually transmitted disease (STD) testing. ?Diabetes screening. This is done by checking your blood sugar (glucose) after you have not eaten for a while (fasting). You may have this done every 1-3 years. ?Bone density scan. This is done to screen for osteoporosis. You may have this done starting at age 67. ?Mammogram. This may be done every 1-2 years. Talk to your health care provider about how often you should have regular mammograms. ?Talk with your health care provider about your test results, treatment options, and if necessary, the need for more tests. ?Vaccines  ?  Your health care provider may recommend certain vaccines, such as: ?Influenza vaccine. This is  recommended every year. ?Tetanus, diphtheria, and acellular pertussis (Tdap, Td) vaccine. You may need a Td booster every 10 years. ?Zoster vaccine. You may need this after age 70. ?Pneumococcal 13-valent conjugate (PCV13) vaccine. One dose is recommended after age 28. ?Pneumococcal polysaccharide (PPSV23) vaccine. One dose is recommended after age 44. ?Talk to your health care provider about which screenings and vaccines you need and how often you need them. ?This information is not intended to replace advice given to you by your health care provider. Make sure you discuss any questions you have with your health care provider. ?Document Released: 04/19/2015 Document Revised: 12/11/2015 Document Reviewed: 01/22/2015 ?Elsevier Interactive Patient Education ? 2017 [place]. ? ?Fall Prevention in the Home ?Falls can cause injuries. They can happen to people of all ages. There are many things you can do to make your home safe and to help prevent falls. ?What can I do on the outside of my home? ?Regularly fix the edges of walkways and driveways and fix any cracks. ?Remove anything that might make you trip as you walk through a door, such as a raised step or threshold. ?Trim any bushes or trees on the path to your home. ?Use bright outdoor lighting. ?Clear any walking paths of anything that might make someone trip, such as rocks or tools. ?Regularly check to see if handrails are loose or broken. Make sure that both sides of any steps have handrails. ?Any raised decks and porches should have guardrails on the edges. ?Have any leaves, snow, or ice cleared regularly. ?Use sand or salt on walking paths during winter. ?Clean up any spills in your garage right away. This includes oil or grease spills. ?What can I do in the bathroom? ?Use night lights. ?Install grab bars by the toilet and in the tub and shower. Do not use towel bars as grab bars. ?Use non-skid mats or decals in the tub or shower. ?If you need to sit down in  the shower, use a plastic, non-slip stool. ?Keep the floor dry. Clean up any water that spills on the floor as soon as it happens. ?Remove soap buildup in the tub or shower regularly. ?Attach bath mats securely with double-sided non-slip rug tape. ?Do not have throw rugs and other things on the floor that can make you trip. ?What can I do in the bedroom? ?Use night lights. ?Make sure that you have a light by your bed that is easy to reach. ?Do not use any sheets or blankets that are too big for your bed. They should not hang down onto the floor. ?Have a firm chair that has side arms. You can use this for support while you get dressed. ?Do not have throw rugs and other things on the floor that can make you trip. ?What can I do in the kitchen? ?Clean up any spills right away. ?Avoid walking on wet floors. ?Keep items that you use a lot in easy-to-reach places. ?If you need to reach something above you, use a strong step stool that has a grab bar. ?Keep electrical cords out of the way. ?Do not use floor polish or wax that makes floors slippery. If you must use wax, use non-skid floor wax. ?Do not have throw rugs and other things on the floor that can make you trip. ?What can I do with my stairs? ?Do not leave any items on the stairs. ?Make sure that there are  handrails on both sides of the stairs and use them. Fix handrails that are broken or loose. Make sure that handrails are as long as the stairways. ?Check any carpeting to make sure that it is firmly attached to the stairs. Fix any carpet that is loose or worn. ?Avoid having throw rugs at the top or bottom of the stairs. If you do have throw rugs, attach them to the floor with carpet tape. ?Make sure that you have a light switch at the top of the stairs and the bottom of the stairs. If you do not have them, ask someone to add them for you. ?What else can I do to help prevent falls? ?Wear shoes that: ?Do not have high heels. ?Have rubber bottoms. ?Are comfortable  and fit you well. ?Are closed at the toe. Do not wear sandals. ?If you use a stepladder: ?Make sure that it is fully opened. Do not climb a closed stepladder. ?Make sure that both sides of the stepladder a

## 2021-07-16 NOTE — Progress Notes (Signed)
? ?Subjective:  ? [name] is a 76 y.o. female who presents for Medicare Annual (Subsequent) preventive examination. ?Virtual Visit via Telephone Note ? ?I connected with  [name] on 07/16/21 at  3:30 PM EDT by telephone and verified that I am speaking with the correct person using two identifiers. ? ?Location: ?Patient: HOME ?Provider: [facility] ?Persons participating in the virtual visit: patient/Nurse Health Advisor ?  ?I discussed the limitations, risks, security and privacy concerns of performing an evaluation and management service by telephone and the availability of in person appointments. The patient expressed understanding and agreed to proceed. ? ?Interactive audio and video telecommunications were attempted between this nurse and patient, however failed, due to patient having technical difficulties OR patient did not have access to video capability.  We continued and completed visit with audio only. ? ?Some vital signs may be absent or patient reported.  ? ?[name], LPN ? ?Review of Systems    ? ?Cardiac Risk Factors include: diabetes mellitus;hypertension;advanced age (>67mn, >>100women);obesity (BMI >30kg/m2);sedentary lifestyle;Other (see comment), Risk factor comments: DDD, FIBROMYALGIA ? ?   ?Objective:  ?  ?Today's Vitals  ? 07/16/21 1532 07/16/21 1534  ?Weight: 203 lb (92.1 kg)   ?Height: _0  (1.676 m)   ?PainSc:  5   ? ?Body mass index is 32.77 kg/m?. ? ? ?  07/16/2021  ?  3:44 PM 01/03/2021  ?  9:47 PM 12/30/2020  ?  2:12 PM 05/31/2020  ? 11:22 AM 05/07/2020  ? 11:04 AM 05/03/2019  ? 11:00 PM 05/03/2019  ?  5:16 PM  ?Advanced Directives  ?Does Patient Have a Medical Advance Directive? Yes No No Yes Yes No No  ?Type of Advance Directive Living will   Living will     ?Does patient want to make changes to medical advance directive?    No - Patient declined     ?Would patient like information on creating a medical advance directive?    No - Patient declined  No - Patient declined    ? ? ?Current Medications (verified) ?Outpatient Encounter Medications as of 07/16/2021  ?Medication Sig  ? ALPRAZolam (XANAX) 0.5 MG tablet Take 1-2 tablets (0.5-1 mg total) by mouth daily as needed for anxiety. Take 1-2 tabs daily as needed  ? ARIPiprazole (ABILIFY) 2 MG tablet TAKE 1 TABLET DAILY  ? atorvastatin (LIPITOR) 10 MG tablet Take 1 tablet (10 mg total) by mouth daily.  ? blood glucose meter kit and supplies KIT Dispense based on patient and insurance preference. Use up to two times daily as directed. (Dx: type 2 DM - E11.9)  ? Blood Glucose Monitoring Suppl (ONE TOUCH ULTRA 2) w/Device KIT CHECK BLOOD SUGAR UP TO TWICE DAILY Dx E11.9  ? Cholecalciferol (VITAMIN D) 50 MCG (2000 UT) tablet Take 2,000 Units by mouth daily.   ? clopidogrel (PLAVIX) 75 MG tablet Take 1 tablet (75 mg total) by mouth daily.  ? conjugated estrogens (PREMARIN) vaginal cream Place vaginally at bedtime. (Patient taking differently: Place 1 applicator vaginally daily as needed (dryness).)  ? docusate sodium (COLACE) 100 MG capsule Take 1 capsule (100 mg total) by mouth every 12 (twelve) hours.  ? Empagliflozin-metFORMIN HCl ER (SYNJARDY XR) 25-1000 MG TB24 Take 1 tablet by mouth daily.  ? escitalopram (LEXAPRO) 20 MG tablet Take 1 tablet (20 mg total) by mouth daily.  ? ezetimibe (ZETIA) 10 MG tablet Take 1 tablet (10 mg total) by mouth daily.  ? gabapentin (NEURONTIN) 600  MG tablet Take 1 tablet (600 mg total) by mouth in the morning, at noon, in the evening, and at bedtime.  ? glipiZIDE (GLUCOTROL XL) 5 MG 24 hr tablet Take 1 tablet (5 mg total) by mouth daily with breakfast.  ? glucose blood (ONETOUCH ULTRA) test strip Check BS up to 2 times daily Dx 11.43  ? HYDROcodone-acetaminophen (NORCO) 10-325 MG tablet hydrocodone 10 mg-acetaminophen 325 mg tablet ? TAKE ONE TABLET BY MOUTH TWICE DAILY AS NEEDED  ? lisinopril-hydrochlorothiazide (ZESTORETIC) 20-25 MG tablet Take 1 tablet by mouth daily.  ? meclizine (ANTIVERT) 25 MG tablet  TAKE ONE TABLET THREE TIMES DAILY AS NEEDED FOR DIZZINESS  ? methocarbamol (ROBAXIN) 500 MG tablet Take 1 tablet (500 mg total) by mouth 2 (two) times daily as needed for muscle spasms.  ? nicotine polacrilex (NICORETTE) 2 MG gum Take 2 mg by mouth as needed for smoking cessation.  ? Omega-3 Fatty Acids (FISH OIL) 1000 MG CAPS Take 1,000 mg by mouth daily.  ? ondansetron (ZOFRAN) 4 MG tablet Take 1 tablet (4 mg total) by mouth every 8 (eight) hours as needed for nausea or vomiting.  ? OneTouch Delica Lancets 60Y MISC CHECK BLOOD SUGAR UP TO TWICE DAILY Dx E11.9  ? pantoprazole (PROTONIX) 40 MG tablet Take 1 tablet (40 mg total) by mouth daily.  ? polyethylene glycol (MIRALAX / GLYCOLAX) 17 g packet Take 17 g by mouth 2 (two) times daily as needed (constipation).  ? Semaglutide, 1 MG/DOSE, (OZEMPIC, 1 MG/DOSE,) 4 MG/3ML SOPN Inject 1 mg into the skin once a week. (Patient taking differently: Inject 1 mg into the skin every Sunday.)  ? traMADol (ULTRAM) 50 MG tablet tramadol 50 mg tablet ? TAKE ONE TABLET EVERY 6 HOURS AS NEEDED FOR PAIN  ? [DISCONTINUED] gabapentin (NEURONTIN) 600 MG tablet gabapentin 600 mg tablet ?  600 mg 4 times a day by oral route.  ? ?No facility-administered encounter medications on file as of 07/16/2021.  ? ? ?Allergies (verified) ?Penicillins, Codeine, Erythromycin, Fenofibrate, Lyrica [pregabalin], Statins, and Sulfa antibiotics  ? ?History: ?Past Medical History:  ?Diagnosis Date  ? Allergy   ? seasonal  ? Arthritis   ? [place]   ? Colon polyps   ? Complication of anesthesia   ? per pt, she woke up in the middle of last colon in 2014.  ? Depression   ? Diabetes mellitus, type 2 ([place]) 06/2010  ? Diabetic neuropathy ([place])   ? Diverticulosis   ? pt unaware  ? Esophageal stricture   ? Fibromyalgia   ? [place]   ? GERD (gastroesophageal reflux disease)   ? Hemorrhoids   ? Hiatal hernia   ? Hyperlipidemia   ? Hypertension   ? Menopause   ? Numbness   ? Peripheral vascular insufficiency ([facility])    ? Retinopathy   ? Bilateral  ? TIA (transient ischemic attack)   ? Tremor   ? Vitamin D deficiency   ? ?Past Surgical History:  ?Procedure Laterality Date  ? ABDOMINAL HYSTERECTOMY    ? partial and then total  ? ANKLE SURGERY Right   ? COLONOSCOPY    ? ELBOW SURGERY    ? left  ? KNEE ARTHROSCOPY Right 09/12/2018  ? Procedure: ARTHROSCOPY KNEE; SYNOVECTOMY;  Surgeon: Gaynelle Arabian, MD;  Location: [facility];  Service: Orthopedics;  Laterality: Right;  54mn  ? REFRACTIVE SURGERY  2003  ? SPINAL CORD STIMULATOR INSERTION N/A 01/01/2021  ? Procedure: SPINAL CORD STIMULATOR PLACEMENT;  Surgeon: BMelina Schools  MD;  Location: [place];  Service: Orthopedics;  Laterality: N/A;  ? TOTAL KNEE ARTHROPLASTY    ? bilateral  ? TOTAL KNEE REVISION Right 03/09/2018  ? Procedure: RIGHT TOTAL KNEE REVISION;  Surgeon: Gaynelle Arabian, MD;  Location: [facility];  Service: Orthopedics;  Laterality: Right;  139mn ?with abductor block  ? VESICOVAGINAL FISTULA CLOSURE W/ TAH  1981  ? ?Family History  ?Problem Relation Age of Onset  ? Diabetes Mother   ? Heart failure Mother   ? Hypertension Mother   ? Cirrhosis Father   ? Cirrhosis Brother   ? ?Social History  ? ?Socioeconomic History  ? Marital status: Married  ?  Spouse name: DKasandra Knudsen ? Number of children: 2  ? Years of education: two years of college  ? Highest education level: Some college, no degree  ?Occupational History  ? Occupation: Retired  ?Tobacco Use  ? Smoking status: Former  ?  Packs/day: 1.00  ?  Years: 10.00  ?  Pack years: 10.00  ?  Types: Cigarettes  ?  Start date: 11/07/1975  ?  Quit date: 06/24/2010  ?  Years since quitting: 11.0  ? Smokeless tobacco: Never  ?Vaping Use  ? Vaping Use: Never used  ?Substance and Sexual Activity  ? Alcohol use: Not Currently  ? Drug use: No  ? Sexual activity: Yes  ?  Birth control/protection: Surgical  ?Other Topics Concern  ? Not on file  ?Social History Narrative  ? Lives at home with her husband.  ? Left-handed.  ? One can of Diet Coke per day.   ? ?Social Determinants of Health  ? ?Financial Resource Strain: Low Risk   ? Difficulty of Paying Living Expenses: Not hard at all  ?Food Insecurity: No Food Insecurity  ? Worried About [employer] fundraiser

## 2021-07-17 ENCOUNTER — Other Ambulatory Visit: Payer: Self-pay | Admitting: Family Medicine

## 2021-07-17 DIAGNOSIS — H52223 Regular astigmatism, bilateral: Secondary | ICD-10-CM | POA: Diagnosis not present

## 2021-07-17 DIAGNOSIS — H5203 Hypermetropia, bilateral: Secondary | ICD-10-CM | POA: Diagnosis not present

## 2021-07-17 DIAGNOSIS — E1143 Type 2 diabetes mellitus with diabetic autonomic (poly)neuropathy: Secondary | ICD-10-CM

## 2021-07-17 DIAGNOSIS — E11610 Type 2 diabetes mellitus with diabetic neuropathic arthropathy: Secondary | ICD-10-CM

## 2021-07-17 DIAGNOSIS — H04123 Dry eye syndrome of bilateral lacrimal glands: Secondary | ICD-10-CM | POA: Diagnosis not present

## 2021-07-17 DIAGNOSIS — H524 Presbyopia: Secondary | ICD-10-CM | POA: Diagnosis not present

## 2021-07-30 ENCOUNTER — Other Ambulatory Visit: Payer: Self-pay | Admitting: Family Medicine

## 2021-07-30 DIAGNOSIS — E11610 Type 2 diabetes mellitus with diabetic neuropathic arthropathy: Secondary | ICD-10-CM

## 2021-07-30 DIAGNOSIS — E1143 Type 2 diabetes mellitus with diabetic autonomic (poly)neuropathy: Secondary | ICD-10-CM

## 2021-08-11 ENCOUNTER — Other Ambulatory Visit: Payer: Self-pay | Admitting: Family Medicine

## 2021-08-11 DIAGNOSIS — E11610 Type 2 diabetes mellitus with diabetic neuropathic arthropathy: Secondary | ICD-10-CM

## 2021-08-12 ENCOUNTER — Other Ambulatory Visit: Payer: Self-pay | Admitting: Family Medicine

## 2021-08-12 DIAGNOSIS — I10 Essential (primary) hypertension: Secondary | ICD-10-CM

## 2021-08-18 ENCOUNTER — Other Ambulatory Visit: Payer: Self-pay | Admitting: Family Medicine

## 2021-08-18 DIAGNOSIS — E1142 Type 2 diabetes mellitus with diabetic polyneuropathy: Secondary | ICD-10-CM

## 2021-08-18 DIAGNOSIS — E1143 Type 2 diabetes mellitus with diabetic autonomic (poly)neuropathy: Secondary | ICD-10-CM

## 2021-08-18 DIAGNOSIS — E11610 Type 2 diabetes mellitus with diabetic neuropathic arthropathy: Secondary | ICD-10-CM

## 2021-08-18 DIAGNOSIS — F320 Major depressive disorder, single episode, mild: Secondary | ICD-10-CM

## 2021-08-18 NOTE — Telephone Encounter (Signed)
30 days given ntbs  

## 2021-08-19 ENCOUNTER — Other Ambulatory Visit: Payer: Self-pay | Admitting: Family Medicine

## 2021-08-19 DIAGNOSIS — E1143 Type 2 diabetes mellitus with diabetic autonomic (poly)neuropathy: Secondary | ICD-10-CM

## 2021-08-19 DIAGNOSIS — E11610 Type 2 diabetes mellitus with diabetic neuropathic arthropathy: Secondary | ICD-10-CM

## 2021-08-19 NOTE — Telephone Encounter (Signed)
MADE APPT FOR A MONTH. ?

## 2021-08-26 DIAGNOSIS — M65861 Other synovitis and tenosynovitis, right lower leg: Secondary | ICD-10-CM | POA: Diagnosis not present

## 2021-08-26 DIAGNOSIS — M65862 Other synovitis and tenosynovitis, left lower leg: Secondary | ICD-10-CM | POA: Diagnosis not present

## 2021-09-02 ENCOUNTER — Other Ambulatory Visit: Payer: Self-pay

## 2021-09-02 DIAGNOSIS — Z87891 Personal history of nicotine dependence: Secondary | ICD-10-CM

## 2021-09-02 DIAGNOSIS — Z122 Encounter for screening for malignant neoplasm of respiratory organs: Secondary | ICD-10-CM

## 2021-09-03 ENCOUNTER — Other Ambulatory Visit: Payer: Self-pay | Admitting: Family Medicine

## 2021-09-03 DIAGNOSIS — E11610 Type 2 diabetes mellitus with diabetic neuropathic arthropathy: Secondary | ICD-10-CM

## 2021-09-03 DIAGNOSIS — E1143 Type 2 diabetes mellitus with diabetic autonomic (poly)neuropathy: Secondary | ICD-10-CM

## 2021-09-04 NOTE — Patient Instructions (Signed)
DUE TO COVID-19 ONLY TWO VISITORS  (aged 76 and older)  ARE ALLOWED TO COME WITH YOU AND STAY IN THE WAITING ROOM ONLY DURING PRE OP AND PROCEDURE.   **NO VISITORS ARE ALLOWED IN THE SHORT STAY AREA OR RECOVERY ROOM!!**  IF YOU WILL BE ADMITTED INTO THE HOSPITAL YOU ARE ALLOWED ONLY FOUR SUPPORT PEOPLE DURING VISITATION HOURS ONLY (7 AM -8PM)   The support person(s) must pass our screening, gel in and out, and wear a mask at all times, including in the patient's room. Patients must also wear a mask when staff or their support person are in the room. Visitors GUEST BADGE MUST BE WORN VISIBLY  One adult visitor may remain with you overnight and MUST be in the room by 8 P.M.     Your procedure is scheduled on: 09/10/21   Report to [facility] Main Entrance    Report to admitting at : 2:15 PM   Call this number if you have problems the morning of surgery [phone]   Do not eat food :After Midnight.   After Midnight you may have the following liquids until: 1:30 PM DAY OF SURGERY  Water Black Coffee (sugar ok, NO MILK/CREAM OR CREAMERS)  Tea (sugar ok, NO MILK/CREAM OR CREAMERS) regular and decaf                             Plain Jell-O (NO RED)                                           Fruit ices (not with fruit pulp, NO RED)                                     Popsicles (NO RED)                                                                  Juice: apple, WHITE grape, WHITE cranberry Sports drinks like Gatorade (NO RED) Clear broth(vegetable,chicken,beef)   Oral Hygiene is also important to reduce your risk of infection.                                    Remember - BRUSH YOUR TEETH THE MORNING OF SURGERY WITH YOUR REGULAR TOOTHPASTE   Do NOT smoke after Midnight   Take these medicines the morning of surgery with A SIP OF WATER: gabapentin,escitalopram,aripiprazole,pantoprazole.Alprazolam,meclizine as needed.  How to Manage Your Diabetes Before and After Surgery  Why  is it important to control my blood sugar before and after surgery? Improving blood sugar levels before and after surgery helps healing and can limit problems. A way of improving blood sugar control is eating a healthy diet by:  Eating less sugar and carbohydrates  Increasing activity/exercise  Talking with your doctor about reaching your blood sugar goals High blood sugars (greater than 180 mg/dL) can raise your risk of infections and slow your recovery, so you will need to  focus on controlling your diabetes during the weeks before surgery. Make sure that the doctor who takes care of your diabetes knows about your planned surgery including the date and location.  How do I manage my blood sugar before surgery? Check your blood sugar at least 4 times a day, starting 2 days before surgery, to make sure that the level is not too high or low. Check your blood sugar the morning of your surgery when you wake up and every 2 hours until you get to the Short Stay unit. If your blood sugar is less than 70 mg/dL, you will need to treat for low blood sugar: Do not take insulin. Treat a low blood sugar (less than 70 mg/dL) with  cup of clear juice (cranberry or apple), 4 glucose tablets, OR glucose gel. Recheck blood sugar in 15 minutes after treatment (to make sure it is greater than 70 mg/dL). If your blood sugar is not greater than 70 mg/dL on recheck, call [phone] for further instructions. Report your blood sugar to the short stay nurse when you get to Short Stay.  If you are admitted to the hospital after surgery: Your blood sugar will be checked by the staff and you will probably be given insulin after surgery (instead of oral diabetes medicines) to make sure you have good blood sugar levels. The goal for blood sugar control after surgery is 80-180 mg/dL.   WHAT DO I DO ABOUT MY DIABETES MEDICATION?  Do not take oral diabetes medicines (pills) the morning of surgery.  THE DAY BEFORE  SURGERY, take glipizide as usual,DO NOT take synjardy    units of       insulin.       THE MORNING OF SURGERY,DO NOT TAKE ANY ORAL DIABETIC MEDICATIONS DAY OF YOUR SURGERY  The day of surgery, do not take other diabetes injectables, including Byetta (exenatide), Bydureon (exenatide ER), Victoza (liraglutide), or Trulicity (dulaglutide).  Bring CPAP mask and tubing day of surgery.                              You may not have any metal on your body including hair pins, jewelry, and body piercing             Do not wear make-up, lotions, powders, perfumes/cologne, or deodorant  Do not wear nail polish including gel and S&S, artificial/acrylic nails, or any other type of covering on natural nails including finger and toenails. If you have artificial nails, gel coating, etc. that needs to be removed by a nail salon please have this removed prior to surgery or surgery may need to be canceled/ delayed if the surgeon/ anesthesia feels like they are unable to be safely monitored.   Do not shave  48 hours prior to surgery.   Do not bring valuables to the hospital. [place].   Contacts, dentures or bridgework may not be worn into surgery.   Bring small overnight bag day of surgery.    Patients discharged on the day of surgery will not be allowed to drive home.  Someone NEEDS to stay with you for the first 24 hours after anesthesia.   Special Instructions: Bring a copy of your healthcare power of attorney and living will documents         the day of surgery if you haven't  scanned them before.              Please read over the following fact sheets you were given: IF YOU HAVE QUESTIONS ABOUT YOUR PRE-OP INSTRUCTIONS PLEASE CALL [phone]     [facility] - Preparing for Surgery Before surgery, you can play an important role.  Because skin is not sterile, your skin needs to be as free of germs as possible.  You can reduce the number of germs on  your skin by washing with CHG (chlorahexidine gluconate) soap before surgery.  CHG is an antiseptic cleaner which kills germs and bonds with the skin to continue killing germs even after washing. Please DO NOT use if you have an allergy to CHG or antibacterial soaps.  If your skin becomes reddened/irritated stop using the CHG and inform your nurse when you arrive at Short Stay. Do not shave (including legs and underarms) for at least 48 hours prior to the first CHG shower.  You may shave your face/neck. Please follow these instructions carefully:  1.  Shower with CHG Soap the night before surgery and the  morning of Surgery.  2.  If you choose to wash your hair, wash your hair first as usual with your  normal  shampoo.  3.  After you shampoo, rinse your hair and body thoroughly to remove the  shampoo.                           4.  Use CHG as you would any other liquid soap.  You can apply chg directly  to the skin and wash                       Gently with a scrungie or clean washcloth.  5.  Apply the CHG Soap to your body ONLY FROM THE NECK DOWN.   Do not use on face/ open                           Wound or open sores. Avoid contact with eyes, ears mouth and genitals (private parts).                       Wash face,  Genitals (private parts) with your normal soap.             6.  Wash thoroughly, paying special attention to the area where your surgery  will be performed.  7.  Thoroughly rinse your body with warm water from the neck down.  8.  DO NOT shower/wash with your normal soap after using and rinsing off  the CHG Soap.                9.  Pat yourself dry with a clean towel.            10.  Wear clean pajamas.            11.  Place clean sheets on your bed the night of your first shower and do not  sleep with pets. Day of Surgery : Do not apply any lotions/deodorants the morning of surgery.  Please wear clean clothes to the hospital/surgery center.  FAILURE TO FOLLOW THESE INSTRUCTIONS MAY  RESULT IN THE CANCELLATION OF YOUR SURGERY PATIENT SIGNATURE_________________________________  NURSE SIGNATURE__________________________________  ________________________________________________________________________

## 2021-09-05 ENCOUNTER — Encounter (HOSPITAL_COMMUNITY)
Admission: RE | Admit: 2021-09-05 | Discharge: 2021-09-05 | Disposition: A | Discharge status: Home / Self Care | Payer: Medicare Other | Source: Ambulatory Visit | Attending: Orthopedic Surgery | Admitting: Orthopedic Surgery

## 2021-09-05 ENCOUNTER — Encounter (HOSPITAL_COMMUNITY): Payer: Self-pay

## 2021-09-05 ENCOUNTER — Other Ambulatory Visit: Payer: Self-pay

## 2021-09-05 VITALS — BP 117/64 | HR 64 | Temp 98.1°F | Ht 66.0 in | Wt 202.0 lb

## 2021-09-05 DIAGNOSIS — I10 Essential (primary) hypertension: Secondary | ICD-10-CM | POA: Diagnosis not present

## 2021-09-05 DIAGNOSIS — Z01818 Encounter for other preprocedural examination: Secondary | ICD-10-CM | POA: Insufficient documentation

## 2021-09-05 DIAGNOSIS — E11610 Type 2 diabetes mellitus with diabetic neuropathic arthropathy: Secondary | ICD-10-CM | POA: Diagnosis not present

## 2021-09-05 DIAGNOSIS — E1143 Type 2 diabetes mellitus with diabetic autonomic (poly)neuropathy: Secondary | ICD-10-CM

## 2021-09-05 HISTORY — DX: Anxiety disorder, unspecified: F41.9

## 2021-09-05 LAB — CBC
HCT: 37.6 % (ref 36.0–46.0)
Hemoglobin: 11.1 g/dL — ABNORMAL LOW (ref 12.0–15.0)
MCH: 22.7 pg — ABNORMAL LOW (ref 26.0–34.0)
MCHC: 29.5 g/dL — ABNORMAL LOW (ref 30.0–36.0)
MCV: 76.9 fL — ABNORMAL LOW (ref 80.0–100.0)
Platelets: 260 10*3/uL (ref 150–400)
RBC: 4.89 MIL/uL (ref 3.87–5.11)
RDW: 18.5 % — ABNORMAL HIGH (ref 11.5–15.5)
WBC: 7.9 10*3/uL (ref 4.0–10.5)
nRBC: 0 % (ref 0.0–0.2)

## 2021-09-05 LAB — BASIC METABOLIC PANEL
Anion gap: 8 (ref 5–15)
BUN: 18 mg/dL (ref 8–23)
CO2: 27 mmol/L (ref 22–32)
Calcium: 9 mg/dL (ref 8.9–10.3)
Chloride: 103 mmol/L (ref 98–111)
Creatinine, Ser: 0.78 mg/dL (ref 0.44–1.00)
GFR, Estimated: >60 mL/min (ref >60)
Glucose, Bld: 130 mg/dL — ABNORMAL HIGH (ref 70–99)
Potassium: 4.5 mmol/L (ref 3.5–5.1)
Sodium: 138 mmol/L (ref 135–145)

## 2021-09-05 LAB — SURGICAL PCR SCREEN
MRSA, PCR: NEGATIVE
Staphylococcus aureus: NEGATIVE

## 2021-09-05 LAB — GLUCOSE, CAPILLARY: Glucose-Capillary: 136 mg/dL — ABNORMAL HIGH (ref 70–99)

## 2021-09-05 LAB — HEMOGLOBIN A1C
Hgb A1c MFr Bld: 7.5 % — ABNORMAL HIGH (ref 4.8–5.6)
Mean Plasma Glucose: 168.55 mg/dL

## 2021-09-05 NOTE — Progress Notes (Addendum)
For Short Stay: [place] appointment date: Date of COVID positive in last 16 days:  Bowel Prep reminder:   For Anesthesia: PCP - Dr. Vonna Kotyk [place] Cardiologist - Dr. Buford Dresser. LOV: 02/25/18  Chest x-ray -  EKG -  Stress Test -  ECHO -  Cardiac Cath -  Pacemaker/ICD device last checked: Pacemaker orders received: Device Rep notified:  Spinal Cord Stimulator: Yes  Sleep Study - CPAP -   Fasting Blood Sugar - 100's - 200's Checks Blood Sugar ___1__ times a week Date and result of last Hgb A1c-6.7: 05/16/21  Blood Thinner Instructions: Dr. Wynelle Link Aspirin Instructions: Aspirin and plavix on hold since 09/04/21 Last Dose:  Activity level: Can go up a flight of stairs and activities of daily living without stopping and without chest pain and/or shortness of breath   Able to exercise without chest pain and/or shortness of breath   Unable to go up a flight of stairs without chest pain and/or shortness of breath     Anesthesia review: Hx: HTN,DIA,TIA's  Patient denies shortness of breath, fever, cough and chest pain at PAT appointment   Patient verbalized understanding of instructions that were given to them at the PAT appointment. Patient was also instructed that they will need to review over the PAT instructions again at home before surgery.

## 2021-09-05 NOTE — Progress Notes (Signed)
Pt. Uses Nicotine gums every 2 hours,she probably will need Nicotine patch during her stay in the hospital,as per pharmacy suggestion.

## 2021-09-09 NOTE — Anesthesia Preprocedure Evaluation (Signed)
Anesthesia Evaluation  Patient identified by MRN, date of birth, ID band Patient awake    Reviewed: Allergy & Precautions, NPO status , Patient's Chart, lab work & pertinent test results  Airway Mallampati: II  TM Distance: >3 FB Neck ROM: Full    Dental no notable dental hx. (+) Teeth Intact, Dental Advisory Given, Missing,    Pulmonary former smoker,    Pulmonary exam normal breath sounds clear to auscultation       Cardiovascular hypertension, + Peripheral Vascular Disease  Normal cardiovascular exam Rhythm:Regular Rate:Normal  04/2019 Echo 1. Left ventricular ejection fraction, by visual estimation, is 60 to  65%. The left ventricle has normal function. There is no left ventricular  hypertrophy.  2. Left ventricular diastolic parameters are consistent with Grade I  diastolic dysfunction (impaired relaxation).  3. The left ventricle has no regional wall motion abnormalities.  4. Global right ventricle has normal systolic function.The right  ventricular size is normal. No increase in right ventricular wall  thickness.  5. Left atrial size was normal.  6. Right atrial size was normal.  7. Mild mitral annular calcification.  8. The mitral valve is grossly normal. No evidence of mitral valve  regurgitation.  9. The tricuspid valve is grossly normal.  10. The tricuspid valve is grossly normal. Tricuspid valve regurgitation  is trivial.  11. The aortic valve is tricuspid. Aortic valve regurgitation is not  visualized. No evidence of aortic valve sclerosis or stenosis.  12. The pulmonic valve was grossly normal. Pulmonic valve regurgitation is  trivial.  13. Normal pulmonary artery systolic pressure.  14. The inferior vena cava is normal in size with greater than 50%  respiratory variability, suggesting right atrial pressure of 3 mmHg.  15. No PFO (bubble study performed).    Neuro/Psych    GI/Hepatic hiatal hernia,  GERD  ,  Endo/Other  diabetes  Renal/GU Lab Results      Component                Value               Date                      CREATININE               0.78                09/05/2021               K                        4.5                 09/05/2021                  Musculoskeletal  (+) Arthritis , Osteoarthritis,  Fibromyalgia -  Abdominal   Peds  Hematology Lab Results      Component                Value               Date                      WBC                      7.9  09/05/2021                HGB                      11.1 (L)            09/05/2021                HCT                      37.6                09/05/2021                MCV                      76.9 (L)            09/05/2021                PLT                      260                 09/05/2021              Anesthesia Other Findings All: See list  Reproductive/Obstetrics                            Anesthesia Physical Anesthesia Plan  ASA: 3  Anesthesia Plan: Spinal and Regional   Post-op Pain Management: Regional block* and Minimal or no pain anticipated   Induction: Intravenous  PONV Risk Score and Plan: 3 and Treatment may vary due to age or medical condition and Ondansetron  Airway Management Planned: Natural Airway and Nasal Cannula  Additional Equipment: None  Intra-op Plan:   Post-operative Plan:   Informed Consent: I have reviewed the patients History and Physical, chart, labs and discussed the procedure including the risks, benefits and alternatives for the proposed anesthesia with the patient or authorized representative who has indicated his/her understanding and acceptance.     Dental advisory given  Plan Discussed with: CRNA  Anesthesia Plan Comments: (Sp w R adductor)       Anesthesia Quick Evaluation

## 2021-09-10 ENCOUNTER — Inpatient Hospital Stay (HOSPITAL_BASED_OUTPATIENT_CLINIC_OR_DEPARTMENT_OTHER): Payer: Medicare Other | Admitting: Anesthesiology

## 2021-09-10 ENCOUNTER — Inpatient Hospital Stay (HOSPITAL_COMMUNITY)
Admission: RE | Admit: 2021-09-10 | Discharge: 2021-09-11 | DRG: 465 | Disposition: A | Discharge status: Home / Self Care | Payer: Medicare Other | Attending: Orthopedic Surgery | Admitting: Orthopedic Surgery

## 2021-09-10 ENCOUNTER — Encounter (HOSPITAL_COMMUNITY): Payer: Self-pay | Admitting: Orthopedic Surgery

## 2021-09-10 ENCOUNTER — Encounter (HOSPITAL_COMMUNITY)
Admission: RE | Disposition: A | Discharge status: Home / Self Care | Payer: Self-pay | Source: Home / Self Care | Attending: Orthopedic Surgery

## 2021-09-10 ENCOUNTER — Inpatient Hospital Stay (HOSPITAL_COMMUNITY): Payer: Medicare Other | Admitting: Emergency Medicine

## 2021-09-10 ENCOUNTER — Other Ambulatory Visit: Payer: Self-pay

## 2021-09-10 DIAGNOSIS — Z880 Allergy status to penicillin: Secondary | ICD-10-CM

## 2021-09-10 DIAGNOSIS — Z87891 Personal history of nicotine dependence: Secondary | ICD-10-CM

## 2021-09-10 DIAGNOSIS — Z96659 Presence of unspecified artificial knee joint: Secondary | ICD-10-CM

## 2021-09-10 DIAGNOSIS — T84018A Broken internal joint prosthesis, other site, initial encounter: Principal | ICD-10-CM

## 2021-09-10 DIAGNOSIS — Z79890 Hormone replacement therapy (postmenopausal): Secondary | ICD-10-CM | POA: Diagnosis not present

## 2021-09-10 DIAGNOSIS — Z8673 Personal history of transient ischemic attack (TIA), and cerebral infarction without residual deficits: Secondary | ICD-10-CM

## 2021-09-10 DIAGNOSIS — E1151 Type 2 diabetes mellitus with diabetic peripheral angiopathy without gangrene: Secondary | ICD-10-CM | POA: Diagnosis present

## 2021-09-10 DIAGNOSIS — K449 Diaphragmatic hernia without obstruction or gangrene: Secondary | ICD-10-CM

## 2021-09-10 DIAGNOSIS — F32A Depression, unspecified: Secondary | ICD-10-CM | POA: Diagnosis present

## 2021-09-10 DIAGNOSIS — J302 Other seasonal allergic rhinitis: Secondary | ICD-10-CM | POA: Diagnosis present

## 2021-09-10 DIAGNOSIS — Z6832 Body mass index (BMI) 32.0-32.9, adult: Secondary | ICD-10-CM

## 2021-09-10 DIAGNOSIS — M797 Fibromyalgia: Secondary | ICD-10-CM | POA: Diagnosis not present

## 2021-09-10 DIAGNOSIS — E119 Type 2 diabetes mellitus without complications: Secondary | ICD-10-CM | POA: Diagnosis not present

## 2021-09-10 DIAGNOSIS — Z7982 Long term (current) use of aspirin: Secondary | ICD-10-CM

## 2021-09-10 DIAGNOSIS — Z7984 Long term (current) use of oral hypoglycemic drugs: Secondary | ICD-10-CM

## 2021-09-10 DIAGNOSIS — Y792 Prosthetic and other implants, materials and accessory orthopedic devices associated with adverse incidents: Secondary | ICD-10-CM | POA: Diagnosis present

## 2021-09-10 DIAGNOSIS — Z7902 Long term (current) use of antithrombotics/antiplatelets: Secondary | ICD-10-CM

## 2021-09-10 DIAGNOSIS — Z888 Allergy status to other drugs, medicaments and biological substances status: Secondary | ICD-10-CM

## 2021-09-10 DIAGNOSIS — G8918 Other acute postprocedural pain: Secondary | ICD-10-CM | POA: Diagnosis not present

## 2021-09-10 DIAGNOSIS — E11610 Type 2 diabetes mellitus with diabetic neuropathic arthropathy: Secondary | ICD-10-CM | POA: Diagnosis present

## 2021-09-10 DIAGNOSIS — I10 Essential (primary) hypertension: Secondary | ICD-10-CM | POA: Diagnosis present

## 2021-09-10 DIAGNOSIS — Z882 Allergy status to sulfonamides status: Secondary | ICD-10-CM

## 2021-09-10 DIAGNOSIS — E11319 Type 2 diabetes mellitus with unspecified diabetic retinopathy without macular edema: Secondary | ICD-10-CM | POA: Diagnosis not present

## 2021-09-10 DIAGNOSIS — I700 Atherosclerosis of aorta: Secondary | ICD-10-CM | POA: Diagnosis not present

## 2021-09-10 DIAGNOSIS — Z90710 Acquired absence of both cervix and uterus: Secondary | ICD-10-CM

## 2021-09-10 DIAGNOSIS — T84032A Mechanical loosening of internal right knee prosthetic joint, initial encounter: Secondary | ICD-10-CM

## 2021-09-10 DIAGNOSIS — K219 Gastro-esophageal reflux disease without esophagitis: Secondary | ICD-10-CM | POA: Diagnosis not present

## 2021-09-10 DIAGNOSIS — Z96651 Presence of right artificial knee joint: Secondary | ICD-10-CM | POA: Diagnosis not present

## 2021-09-10 DIAGNOSIS — G8929 Other chronic pain: Secondary | ICD-10-CM | POA: Diagnosis not present

## 2021-09-10 DIAGNOSIS — Z8249 Family history of ischemic heart disease and other diseases of the circulatory system: Secondary | ICD-10-CM

## 2021-09-10 DIAGNOSIS — E1143 Type 2 diabetes mellitus with diabetic autonomic (poly)neuropathy: Secondary | ICD-10-CM | POA: Diagnosis present

## 2021-09-10 DIAGNOSIS — Z885 Allergy status to narcotic agent status: Secondary | ICD-10-CM

## 2021-09-10 DIAGNOSIS — Z8719 Personal history of other diseases of the digestive system: Secondary | ICD-10-CM

## 2021-09-10 DIAGNOSIS — Z7985 Long-term (current) use of injectable non-insulin antidiabetic drugs: Secondary | ICD-10-CM | POA: Diagnosis not present

## 2021-09-10 DIAGNOSIS — Z96652 Presence of left artificial knee joint: Secondary | ICD-10-CM | POA: Diagnosis present

## 2021-09-10 DIAGNOSIS — Z9682 Presence of neurostimulator: Secondary | ICD-10-CM

## 2021-09-10 DIAGNOSIS — Z79899 Other long term (current) drug therapy: Secondary | ICD-10-CM

## 2021-09-10 DIAGNOSIS — E6609 Other obesity due to excess calories: Secondary | ICD-10-CM | POA: Diagnosis not present

## 2021-09-10 DIAGNOSIS — E559 Vitamin D deficiency, unspecified: Secondary | ICD-10-CM | POA: Diagnosis present

## 2021-09-10 DIAGNOSIS — Z881 Allergy status to other antibiotic agents status: Secondary | ICD-10-CM

## 2021-09-10 DIAGNOSIS — Z833 Family history of diabetes mellitus: Secondary | ICD-10-CM

## 2021-09-10 HISTORY — PX: PATELLA REVISION: SHX6526

## 2021-09-10 LAB — GLUCOSE, CAPILLARY
Glucose-Capillary: 155 mg/dL — ABNORMAL HIGH (ref 70–99)
Glucose-Capillary: 119 mg/dL — ABNORMAL HIGH (ref 70–99)
Glucose-Capillary: 279 mg/dL — ABNORMAL HIGH (ref 70–99)

## 2021-09-10 SURGERY — REVISION, PROCEDURE, PATELLA
Anesthesia: Regional | Site: Knee | Laterality: Right

## 2021-09-10 MED ORDER — POLYETHYLENE GLYCOL 3350 17 G PO PACK: 17.0000 g | PACK | Freq: Every day | ORAL | Status: DC | PRN

## 2021-09-10 MED ORDER — ONDANSETRON HCL 4 MG/2ML IJ SOLN
INTRAMUSCULAR | Status: AC
  Filled 2021-09-10: qty 2

## 2021-09-10 MED ORDER — ACETAMINOPHEN 500 MG PO TABS
1000.0000 mg | ORAL_TABLET | Freq: Four times a day (QID) | ORAL | Status: DC
  Administered 2021-09-10 – 2021-09-11 (×3): 1000 mg via ORAL
  Filled 2021-09-10 (×3): qty 2

## 2021-09-10 MED ORDER — BUPIVACAINE LIPOSOME 1.3 % IJ SUSP
20.0000 mL | Freq: Once | INTRAMUSCULAR | Status: DC
Start: 2021-09-10 — End: 2021-09-10

## 2021-09-10 MED ORDER — INSULIN ASPART 100 UNIT/ML IJ SOLN
0.0000 [IU] | Freq: Every day | INTRAMUSCULAR | Status: DC
  Administered 2021-09-10: 3 [IU] via SUBCUTANEOUS

## 2021-09-10 MED ORDER — BUPIVACAINE LIPOSOME 1.3 % IJ SUSP
INTRAMUSCULAR | Status: AC
  Filled 2021-09-10: qty 20

## 2021-09-10 MED ORDER — BUPIVACAINE LIPOSOME 1.3 % IJ SUSP
INTRAMUSCULAR | Status: DC | PRN
  Administered 2021-09-10: 20 mL

## 2021-09-10 MED ORDER — CHLORHEXIDINE GLUCONATE 0.12 % MT SOLN
15.0000 mL | Freq: Once | OROMUCOSAL | Status: AC
  Administered 2021-09-10: 15 mL via OROMUCOSAL

## 2021-09-10 MED ORDER — FENTANYL CITRATE (PF) 100 MCG/2ML IJ SOLN
INTRAMUSCULAR | Status: AC
  Filled 2021-09-10: qty 2

## 2021-09-10 MED ORDER — METHOCARBAMOL 500 MG PO TABS: 500.0000 mg | ORAL_TABLET | Freq: Four times a day (QID) | ORAL | Status: DC | PRN

## 2021-09-10 MED ORDER — GABAPENTIN 300 MG PO CAPS
600.0000 mg | ORAL_CAPSULE | Freq: Two times a day (BID) | ORAL | Status: DC
  Administered 2021-09-10 – 2021-09-11 (×2): 600 mg via ORAL
  Filled 2021-09-10 (×2): qty 2

## 2021-09-10 MED ORDER — ARIPIPRAZOLE 2 MG PO TABS
1.0000 mg | ORAL_TABLET | Freq: Every day | ORAL | Status: DC
  Administered 2021-09-10: 1 mg via ORAL
  Filled 2021-09-10: qty 1

## 2021-09-10 MED ORDER — PROPOFOL 500 MG/50ML IV EMUL
INTRAVENOUS | Status: DC | PRN
  Administered 2021-09-10: 50 ug/kg/min via INTRAVENOUS

## 2021-09-10 MED ORDER — DEXAMETHASONE SODIUM PHOSPHATE 10 MG/ML IJ SOLN
INTRAMUSCULAR | Status: AC
Start: 2021-09-10 — End: ?
  Filled 2021-09-10: qty 1

## 2021-09-10 MED ORDER — LIDOCAINE 2% (20 MG/ML) 5 ML SYRINGE
INTRAMUSCULAR | Status: DC | PRN
  Administered 2021-09-10: 100 mg via INTRAVENOUS

## 2021-09-10 MED ORDER — ASPIRIN 325 MG PO TBEC
325.0000 mg | DELAYED_RELEASE_TABLET | Freq: Every day | ORAL | Status: DC
  Administered 2021-09-11: 325 mg via ORAL
  Filled 2021-09-10: qty 1

## 2021-09-10 MED ORDER — MECLIZINE HCL 25 MG PO TABS: 25.0000 mg | ORAL_TABLET | Freq: Two times a day (BID) | ORAL | Status: DC | PRN

## 2021-09-10 MED ORDER — PROPOFOL 10 MG/ML IV BOLUS
INTRAVENOUS | Status: DC | PRN
  Administered 2021-09-10: 20 mg via INTRAVENOUS

## 2021-09-10 MED ORDER — TRANEXAMIC ACID-NACL 1000-0.7 MG/100ML-% IV SOLN
1000.0000 mg | INTRAVENOUS | Status: AC
  Administered 2021-09-10: 1000 mg via INTRAVENOUS
  Filled 2021-09-10: qty 100

## 2021-09-10 MED ORDER — MIDAZOLAM HCL 2 MG/2ML IJ SOLN
1.0000 mg | Freq: Once | INTRAMUSCULAR | Status: DC
  Filled 2021-09-10: qty 2

## 2021-09-10 MED ORDER — SODIUM CHLORIDE (PF) 0.9 % IJ SOLN
INTRAMUSCULAR | Status: DC | PRN
  Administered 2021-09-10: 10 mL

## 2021-09-10 MED ORDER — DEXAMETHASONE SODIUM PHOSPHATE 10 MG/ML IJ SOLN
INTRAMUSCULAR | Status: AC
  Filled 2021-09-10: qty 1

## 2021-09-10 MED ORDER — PROPOFOL 10 MG/ML IV BOLUS
INTRAVENOUS | Status: AC
  Filled 2021-09-10: qty 20

## 2021-09-10 MED ORDER — POVIDONE-IODINE 10 % EX SWAB
2.0000 "application " | Freq: Once | CUTANEOUS | Status: AC
  Administered 2021-09-10: 2 via TOPICAL

## 2021-09-10 MED ORDER — INSULIN ASPART 100 UNIT/ML IJ SOLN
0.0000 [IU] | Freq: Three times a day (TID) | INTRAMUSCULAR | Status: DC
  Administered 2021-09-11: 8 [IU] via SUBCUTANEOUS

## 2021-09-10 MED ORDER — MENTHOL 3 MG MT LOZG: 1.0000 | LOZENGE | OROMUCOSAL | Status: DC | PRN

## 2021-09-10 MED ORDER — ONDANSETRON HCL 4 MG/2ML IJ SOLN: 4.0000 mg | Freq: Four times a day (QID) | INTRAMUSCULAR | Status: DC | PRN

## 2021-09-10 MED ORDER — ACETAMINOPHEN 10 MG/ML IV SOLN
1000.0000 mg | Freq: Four times a day (QID) | INTRAVENOUS | Status: DC
  Administered 2021-09-10: 1000 mg via INTRAVENOUS
  Filled 2021-09-10: qty 100

## 2021-09-10 MED ORDER — ROPIVACAINE HCL 5 MG/ML IJ SOLN
INTRAMUSCULAR | Status: DC | PRN
  Administered 2021-09-10: 30 mL via PERINEURAL

## 2021-09-10 MED ORDER — ONDANSETRON HCL 4 MG PO TABS: 4.0000 mg | ORAL_TABLET | Freq: Four times a day (QID) | ORAL | Status: DC | PRN

## 2021-09-10 MED ORDER — PANTOPRAZOLE SODIUM 40 MG PO TBEC
40.0000 mg | DELAYED_RELEASE_TABLET | Freq: Every morning | ORAL | Status: DC
  Administered 2021-09-11: 40 mg via ORAL
  Filled 2021-09-10: qty 1

## 2021-09-10 MED ORDER — DOCUSATE SODIUM 100 MG PO CAPS
100.0000 mg | ORAL_CAPSULE | Freq: Two times a day (BID) | ORAL | Status: DC
  Administered 2021-09-10 – 2021-09-11 (×2): 100 mg via ORAL
  Filled 2021-09-10 (×2): qty 1

## 2021-09-10 MED ORDER — DEXAMETHASONE SODIUM PHOSPHATE 10 MG/ML IJ SOLN: 8.0000 mg | Freq: Once | INTRAMUSCULAR | Status: DC

## 2021-09-10 MED ORDER — ATORVASTATIN CALCIUM 10 MG PO TABS
10.0000 mg | ORAL_TABLET | ORAL | Status: DC
  Filled 2021-09-10: qty 1

## 2021-09-10 MED ORDER — LACTATED RINGERS IV SOLN: INTRAVENOUS | Status: DC

## 2021-09-10 MED ORDER — DEXAMETHASONE SODIUM PHOSPHATE 10 MG/ML IJ SOLN
INTRAMUSCULAR | Status: DC | PRN
  Administered 2021-09-10: 10 mg via INTRAVENOUS

## 2021-09-10 MED ORDER — PROPOFOL 500 MG/50ML IV EMUL
INTRAVENOUS | Status: AC
  Filled 2021-09-10: qty 50

## 2021-09-10 MED ORDER — 0.9 % SODIUM CHLORIDE (POUR BTL) OPTIME
TOPICAL | Status: DC | PRN
  Administered 2021-09-10: 1000 mL

## 2021-09-10 MED ORDER — METOCLOPRAMIDE HCL 5 MG PO TABS: 5.0000 mg | ORAL_TABLET | Freq: Three times a day (TID) | ORAL | Status: DC | PRN

## 2021-09-10 MED ORDER — BUPIVACAINE-EPINEPHRINE (PF) 0.25% -1:200000 IJ SOLN
INTRAMUSCULAR | Status: DC | PRN
  Administered 2021-09-10: 30 mL

## 2021-09-10 MED ORDER — PHENOL 1.4 % MT LIQD: 1.0000 | OROMUCOSAL | Status: DC | PRN

## 2021-09-10 MED ORDER — CLONIDINE HCL (ANALGESIA) 100 MCG/ML EP SOLN
EPIDURAL | Status: DC | PRN
  Administered 2021-09-10: 100 ug

## 2021-09-10 MED ORDER — OXYCODONE HCL 5 MG PO TABS: 5.0000 mg | ORAL_TABLET | ORAL | Status: DC | PRN

## 2021-09-10 MED ORDER — CEFAZOLIN SODIUM-DEXTROSE 2-4 GM/100ML-% IV SOLN
2.0000 g | INTRAVENOUS | Status: AC
  Administered 2021-09-10: 2 g via INTRAVENOUS
  Filled 2021-09-10: qty 100

## 2021-09-10 MED ORDER — SODIUM CHLORIDE 0.9 % IV SOLN: INTRAVENOUS | Status: DC

## 2021-09-10 MED ORDER — KETOROLAC TROMETHAMINE 30 MG/ML IJ SOLN
INTRAMUSCULAR | Status: AC
  Filled 2021-09-10: qty 1

## 2021-09-10 MED ORDER — BUPIVACAINE IN DEXTROSE 0.75-8.25 % IT SOLN
INTRATHECAL | Status: DC | PRN
  Administered 2021-09-10: 11.25 mg via INTRATHECAL

## 2021-09-10 MED ORDER — DIPHENHYDRAMINE HCL 12.5 MG/5ML PO ELIX: 12.5000 mg | ORAL_SOLUTION | ORAL | Status: DC | PRN

## 2021-09-10 MED ORDER — MORPHINE SULFATE (PF) 2 MG/ML IV SOLN: 1.0000 mg | INTRAVENOUS | Status: DC | PRN

## 2021-09-10 MED ORDER — EZETIMIBE 10 MG PO TABS
10.0000 mg | ORAL_TABLET | Freq: Every morning | ORAL | Status: DC
  Administered 2021-09-11: 10 mg via ORAL
  Filled 2021-09-10: qty 1

## 2021-09-10 MED ORDER — NICOTINE 14 MG/24HR TD PT24
14.0000 mg | MEDICATED_PATCH | Freq: Every day | TRANSDERMAL | Status: DC
  Administered 2021-09-10 – 2021-09-11 (×2): 14 mg via TRANSDERMAL
  Filled 2021-09-10 (×2): qty 1

## 2021-09-10 MED ORDER — CEFAZOLIN SODIUM-DEXTROSE 2-4 GM/100ML-% IV SOLN
2.0000 g | Freq: Four times a day (QID) | INTRAVENOUS | Status: AC
  Administered 2021-09-10 – 2021-09-11 (×2): 2 g via INTRAVENOUS
  Filled 2021-09-10 (×2): qty 100

## 2021-09-10 MED ORDER — METHOCARBAMOL 500 MG IVPB - SIMPLE MED: 500.0000 mg | Freq: Four times a day (QID) | INTRAVENOUS | Status: DC | PRN

## 2021-09-10 MED ORDER — GLIPIZIDE ER 5 MG PO TB24
5.0000 mg | ORAL_TABLET | Freq: Every day | ORAL | Status: DC
  Administered 2021-09-11: 5 mg via ORAL
  Filled 2021-09-10: qty 1

## 2021-09-10 MED ORDER — BUPIVACAINE-EPINEPHRINE (PF) 0.25% -1:200000 IJ SOLN
INTRAMUSCULAR | Status: AC
  Filled 2021-09-10: qty 30

## 2021-09-10 MED ORDER — PHENYLEPHRINE HCL-NACL 20-0.9 MG/250ML-% IV SOLN
INTRAVENOUS | Status: DC | PRN
  Administered 2021-09-10: 50 ug/min via INTRAVENOUS

## 2021-09-10 MED ORDER — ONDANSETRON HCL 4 MG/2ML IJ SOLN
INTRAMUSCULAR | Status: AC
Start: 2021-09-10 — End: ?
  Filled 2021-09-10: qty 2

## 2021-09-10 MED ORDER — ORAL CARE MOUTH RINSE: 15.0000 mL | Freq: Once | OROMUCOSAL | Status: AC

## 2021-09-10 MED ORDER — BISACODYL 10 MG RE SUPP: 10.0000 mg | Freq: Every day | RECTAL | Status: DC | PRN

## 2021-09-10 MED ORDER — PHENYLEPHRINE HCL-NACL 20-0.9 MG/250ML-% IV SOLN
INTRAVENOUS | Status: AC
  Filled 2021-09-10: qty 250

## 2021-09-10 MED ORDER — ESCITALOPRAM OXALATE 20 MG PO TABS
20.0000 mg | ORAL_TABLET | Freq: Every morning | ORAL | Status: DC
  Administered 2021-09-11: 20 mg via ORAL
  Filled 2021-09-10: qty 1

## 2021-09-10 MED ORDER — FLEET ENEMA 7-19 GM/118ML RE ENEM
1.0000 | ENEMA | Freq: Once | RECTAL | Status: DC | PRN
Start: 2021-09-10 — End: 2021-09-11

## 2021-09-10 MED ORDER — KETOROLAC TROMETHAMINE 30 MG/ML IJ SOLN
INTRAMUSCULAR | Status: DC | PRN
  Administered 2021-09-10: 30 mg

## 2021-09-10 MED ORDER — METOCLOPRAMIDE HCL 5 MG/ML IJ SOLN: 5.0000 mg | Freq: Three times a day (TID) | INTRAMUSCULAR | Status: DC | PRN

## 2021-09-10 MED ORDER — ONDANSETRON HCL 4 MG/2ML IJ SOLN
INTRAMUSCULAR | Status: DC | PRN
  Administered 2021-09-10: 4 mg via INTRAVENOUS

## 2021-09-10 MED ORDER — CLOPIDOGREL BISULFATE 75 MG PO TABS
75.0000 mg | ORAL_TABLET | Freq: Every day | ORAL | Status: DC
  Administered 2021-09-11: 75 mg via ORAL
  Filled 2021-09-10: qty 1

## 2021-09-10 MED ORDER — SODIUM CHLORIDE (PF) 0.9 % IJ SOLN
INTRAMUSCULAR | Status: AC
  Filled 2021-09-10: qty 20

## 2021-09-10 MED ORDER — FENTANYL CITRATE PF 50 MCG/ML IJ SOSY
50.0000 ug | PREFILLED_SYRINGE | Freq: Once | INTRAMUSCULAR | Status: AC
  Administered 2021-09-10: 50 ug via INTRAVENOUS
  Filled 2021-09-10: qty 2

## 2021-09-10 MED ORDER — FENTANYL CITRATE (PF) 100 MCG/2ML IJ SOLN
INTRAMUSCULAR | Status: DC | PRN
  Administered 2021-09-10: 25 ug via INTRAVENOUS

## 2021-09-10 MED ORDER — PROPOFOL 1000 MG/100ML IV EMUL
INTRAVENOUS | Status: AC
  Filled 2021-09-10: qty 100

## 2021-09-10 SURGICAL SUPPLY — 48 items
BAG COUNTER SPONGE SURGICOUNT (BAG) ×1 IMPLANT
BAG SPEC THK2 15X12 ZIP CLS (MISCELLANEOUS) ×1
BAG SPNG CNTER NS LX DISP (BAG) ×1
BAG ZIPLOCK 12X15 (MISCELLANEOUS) ×1 IMPLANT
BLADE SAW SGTL 13.0X1.19X90.0M (BLADE) ×2 IMPLANT
BNDG ELASTIC 6X5.8 VLCR STR LF (GAUZE/BANDAGES/DRESSINGS) ×2 IMPLANT
BOWL SMART MIX CTS (DISPOSABLE) ×2 IMPLANT
CEMENT HV SMART SET (Cement) IMPLANT
COVER SURGICAL LIGHT HANDLE (MISCELLANEOUS) ×2 IMPLANT
CUFF TOURN SGL QUICK 34 (TOURNIQUET CUFF) ×2
CUFF TRNQT CYL 34X4.125X (TOURNIQUET CUFF) ×1 IMPLANT
DRAPE INCISE IOBAN 66X45 STRL (DRAPES) ×6 IMPLANT
DRAPE U-SHAPE 47X51 STRL (DRAPES) ×2 IMPLANT
DRSG AQUACEL AG ADV 3.5X10 (GAUZE/BANDAGES/DRESSINGS) ×2 IMPLANT
DURAPREP 26ML APPLICATOR (WOUND CARE) ×4 IMPLANT
ELECT REM PT RETURN 15FT ADLT (MISCELLANEOUS) ×2 IMPLANT
GLOVE BIO SURGEON STRL SZ 6 (GLOVE) ×3 IMPLANT
GLOVE BIO SURGEON STRL SZ8 (GLOVE) ×3 IMPLANT
GLOVE BIOGEL M 7.0 STRL (GLOVE) ×3 IMPLANT
GLOVE BIOGEL PI IND STRL 6.5 (GLOVE) ×2 IMPLANT
GLOVE BIOGEL PI IND STRL 8 (GLOVE) ×1 IMPLANT
GLOVE BIOGEL PI INDICATOR 6.5 (GLOVE) ×2
GLOVE BIOGEL PI INDICATOR 8 (GLOVE) ×1
GLOVE ECLIPSE 7.5 STRL STRAW (GLOVE) ×2 IMPLANT
GOWN STRL REUS W/ TWL LRG LVL3 (GOWN DISPOSABLE) ×2 IMPLANT
GOWN STRL REUS W/TWL LRG LVL3 (GOWN DISPOSABLE) ×4
HANDPIECE INTERPULSE COAX TIP (DISPOSABLE) ×2
IMMOBILIZER KNEE 20 (SOFTGOODS) ×2
IMMOBILIZER KNEE 20 THIGH 36 (SOFTGOODS) IMPLANT
KIT TURNOVER KIT A (KITS) ×1 IMPLANT
MANIFOLD NEPTUNE II (INSTRUMENTS) ×2 IMPLANT
PACK TOTAL KNEE CUSTOM (KITS) ×2 IMPLANT
PADDING CAST ABS 6INX4YD NS (CAST SUPPLIES) ×1
PADDING CAST ABS COTTON 6X4 NS (CAST SUPPLIES) IMPLANT
PROTECTOR NERVE ULNAR (MISCELLANEOUS) ×2 IMPLANT
SET HNDPC FAN SPRY TIP SCT (DISPOSABLE) ×1 IMPLANT
SET PAD KNEE POSITIONER (MISCELLANEOUS) ×2 IMPLANT
STRIP CLOSURE SKIN 1/2X4 (GAUZE/BANDAGES/DRESSINGS) ×2 IMPLANT
SUT MNCRL AB 4-0 PS2 18 (SUTURE) ×2 IMPLANT
SUT STRATAFIX 0 PDS 27 VIOLET (SUTURE) ×2
SUT VIC AB 1 CT1 36 (SUTURE) ×2 IMPLANT
SUT VIC AB 2-0 CT1 27 (SUTURE) ×6
SUT VIC AB 2-0 CT1 TAPERPNT 27 (SUTURE) ×3 IMPLANT
SUT VLOC 180 0 24IN GS25 (SUTURE) ×2 IMPLANT
SUTURE STRATFX 0 PDS 27 VIOLET (SUTURE) IMPLANT
TRAY FOLEY MTR SLVR 16FR STAT (SET/KITS/TRAYS/PACK) ×2 IMPLANT
WATER STERILE IRR 1000ML POUR (IV SOLUTION) ×2 IMPLANT
WRAP KNEE MAXI GEL POST OP (GAUZE/BANDAGES/DRESSINGS) ×2 IMPLANT

## 2021-09-10 NOTE — Brief Op Note (Signed)
09/10/2021  3:10 PM  PATIENT:  [name]  76 y.o. female  PRE-OPERATIVE DIAGNOSIS:  patellar loosening right total knee arthroplasty  POST-OPERATIVE DIAGNOSIS:  * No post-op diagnosis entered *  PROCEDURE:  Procedure(s): Resection arthroplasty right patella (Right)  SURGEON:  Surgeon(s) and Role:    Gaynelle Arabian, MD - Primary  PHYSICIAN ASSISTANT:   ASSISTANTS: Kristie edmisten, PA-C   ANESTHESIA:   spinal  EBL:  25 mL   BLOOD ADMINISTERED:none  DRAINS: none   LOCAL MEDICATIONS USED:  OTHER Exparel  COUNTS:  YES  TOURNIQUET:   Total Tourniquet Time Documented: Thigh (Right) - 24 minutes Total: Thigh (Right) - 24 minutes   DICTATION: .Other Dictation: Dictation Number [id number]  PLAN OF CARE: Admit for overnight observation  PATIENT DISPOSITION:  PACU - hemodynamically stable.

## 2021-09-10 NOTE — Transfer of Care (Signed)
Immediate Anesthesia Transfer of Care Note  Patient: [name]  Procedure(s) Performed: Resection arthroplasty right patella (Right: Knee)  Patient Location: PACU  Anesthesia Type:Spinal  Level of Consciousness: awake  Airway & Oxygen Therapy: Patient Spontanous Breathing and Patient connected to face mask oxygen  Post-op Assessment: Report given to RN and Post -op Vital signs reviewed and stable  Post vital signs: Reviewed and stable  Last Vitals:  Vitals Value Taken Time  BP 110/61 09/10/21 1546  Temp    Pulse 74 09/10/21 1548  Resp 17 09/10/21 1548  SpO2 98 % 09/10/21 1548  Vitals shown include unvalidated device data.  Last Pain:  Vitals:   09/10/21 1347  TempSrc: Oral  PainSc: 0-No pain      Patients Stated Pain Goal: 4 (32/54/98 2641)  Complications: No notable events documented.

## 2021-09-10 NOTE — Anesthesia Procedure Notes (Signed)
Anesthesia Regional Block: Adductor canal block   Pre-Anesthetic Checklist: , timeout performed,  Correct Patient, Correct Site, Correct Laterality,  Correct Procedure, Correct Position, site marked,  Risks and benefits discussed,  Surgical consent,  Pre-op evaluation,  At surgeon's request and post-op pain management  Laterality: Lower and Right  Prep: chloraprep       Needles:  Injection technique: Single-shot  Needle Type: Echogenic Needle     Needle Length: 9cm  Needle Gauge: 22     Additional Needles:   Procedures:,,,, ultrasound used (permanent image in chart),,    Narrative:  Start time: 09/10/2021 1:52 PM End time: 09/10/2021 1:58 PM Injection made incrementally with aspirations every 5 mL.  Performed by: Personally  Anesthesiologist: Barnet Glasgow, MD  Additional Notes: Block assessed prior to surgery. Pt tolerated procedure well.

## 2021-09-10 NOTE — Interval H&P Note (Signed)
History and Physical Interval Note:  09/10/2021 1:07 PM  [name]  has presented today for surgery, with the diagnosis of patellar loosening right total knee arthroplasty.  The various methods of treatment have been discussed with the patient and family. After consideration of risks, benefits and other options for treatment, the patient has consented to  Procedure(s): Right knee patellar revision versus component resection (Right) as a surgical intervention.  The patient's history has been reviewed, patient examined, no change in status, stable for surgery.  I have reviewed the patient's chart and labs.  Questions were answered to the patient's satisfaction.     Pilar Plate Dyanne Yorks

## 2021-09-10 NOTE — Op Note (Signed)
NAME: [name], HAPP MEDICAL RECORD NO: [id number] ACCOUNT NO: [id number] DATE OF BIRTH: 04/27/1945 FACILITY: [facility] LOCATION: [id number] PHYSICIAN: Dione Plover. 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  PROCEDURE:  Resection arthroplasty, right patella.  SURGEON:  Dione Plover. Lisia Westbay, MD  ASSISTANT:  Theresa Duty, PA-C   ANESTHESIA: Spinal.  ESTIMATED BLOOD LOSS:  25 mL  DRAINS:  None.  TOURNIQUET TIME:  24 minutes at 300 mmHg.  COMPLICATIONS:  None.  CONDITION:  Stable to recovery.  BRIEF CLINICAL NOTE:  The patient is a 76 year old female who had a right total knee arthroplasty revision done in 2019.  Her patellar component was stable at the time and it was not revised.  She presented to the office several months ago with painful  popping of the knee consistent with patellar clunk syndrome.  She was taken for an arthroscopy and synovectomy 2 weeks ago when at the time of the arthroscopy, was noted to have loosening of the patellar component.  She is taken to the operating room now  for resection versus revision of the patellar component.  Note that her preoperative x-rays did not show any loosening of the patella.  DESCRIPTION OF PROCEDURE:  After successful administration of spinal anesthetic, a tourniquet was placed on her right thigh.  Right lower extremity was prepped and draped in the usual sterile fashion.  The extremity was wrapped in Esmarch and tourniquet  inflated to 300 mmHg.  Midline incision was made with a 10 blade through subcutaneous tissue to the level of the extensor mechanism.  Fresh blade was used to make a medial parapatellar arthrotomy.  There was minimal fluid encountered in the joint.  The  scar tissue and the extensor mechanism is resected both medially and laterally.  I elevated  the fat pad up from distal to proximal and left it attached at the inferior pole of the patella to potentially use as a soft tissue interpositional graft if the  patella was too thin to have another component.  I removed the patellar component, which was grossly loose.  There was a tremendous amount of fibrous tissue left in the bony shell.  Once I got all the fibrous tissue out, all that was left was essentially  a cortical shell of the patella, which was not enough to house a new component.  I put the knee through range of motion.  It was tracking normally without any popping.  I rotated the fat pad up to cover the raw surface of the patella and we sutured it  back down to tissue with Ethibond suture.  Again, this tracked normally and there was no audible catching present.  The wound was copiously irrigated with saline solution and pulsatile lavage.  20 mL of Exparel mixed with 60 mL of saline injected into  the extensor mechanism, periosteum of the femur, subcutaneous tissues.  The arthrotomy was then closed with running 0 Stratafix suture.  Flexion against gravity was about 130 degrees.  Tourniquet was released for a total tourniquet time of 24 minutes.   Subcutaneous was then closed with interrupted 2-0 Vicryl and subcuticular running 4-0 Monocryl.  Incisions cleaned and dried and Steri-Strips and a sterile dressing applied.  She was then awakened and transported to recovery in stable condition.      PAA D: 09/10/2021 3:15:25 pm T: 09/10/2021 10:16:00 pm  JOB: [id number]

## 2021-09-10 NOTE — Discharge Instructions (Addendum)
Gaynelle Arabian, MD Total [facility] Specialist [facility] [address] [place], [place] [address] [phone]  PATELLA REVISION POSTOPERATIVE DIRECTIONS  Knee Rehabilitation, Guidelines Following Surgery  Results after knee surgery are often greatly improved when you follow the exercise, range of motion and muscle strengthening exercises prescribed by your doctor. Safety measures are also important to protect the knee from further injury. If any of these exercises cause you to have increased pain or swelling in your knee joint, decrease the amount until you are comfortable again and slowly increase them. If you have problems or questions, call your caregiver or physical therapist for advice.   BLOOD CLOT PREVENTION Take a 325 mg Aspirin once a day for three weeks following surgery with your usual Plavix dose. Then resume one 81 mg aspirin once a day with your usual Plavix dose.  You may resume your vitamins/supplements upon discharge from the hospital. Do not take any NSAIDs (Advil, Aleve, Ibuprofen, Meloxicam, etc.) until you have discontinued the 325 mg Aspirin.   HOME CARE INSTRUCTIONS  Remove items at home which could result in a fall. This includes throw rugs or furniture in walking pathways.  ICE to the affected knee as much as tolerated. Icing helps control swelling. If the swelling is well controlled you will be more comfortable and rehab easier. Continue to use ice on the knee for pain and swelling from surgery. You may notice swelling that will progress down to the foot and ankle. This is normal after surgery. Elevate the leg when you are not up walking on it.    Continue to use the breathing machine which will help keep your temperature down. It is common for your temperature to cycle up and down following surgery, especially at night when you are not up moving around and exerting yourself. The breathing machine keeps your lungs expanded and your  temperature down. Do not place pillow under the operative knee, focus on keeping the knee straight while resting  DIET You may resume your previous home diet once you are discharged from the hospital.  DRESSING / WOUND CARE / SHOWERING Keep your bulky bandage on for 2 days. On the third post-operative day you may remove the Ace bandage and gauze. There is a waterproof adhesive bandage on your skin which will stay in place until your first follow-up appointment. Once you remove this you will not need to place another bandage You may begin showering 3 days following surgery, but do not submerge the incision under water.  ACTIVITY For the first 5 days, the key is rest and control of pain and swelling Do your home exercises twice a day starting on post-operative day 3. On the days you go to physical therapy, just do the home exercises once that day. You should rest, ice and elevate the leg for 50 minutes out of every hour. Get up and walk/stretch for 10 minutes per hour. After 5 days you can increase your activity slowly as tolerated. Walk with your walker as instructed. Use the walker until you are comfortable transitioning to a cane. Walk with the cane in the opposite hand of the operative leg. You may discontinue the cane once you are comfortable and walking steadily. Avoid periods of inactivity such as sitting longer than an hour when not asleep. This helps prevent blood clots.  You may discontinue the knee immobilizer once you are able to perform a straight leg raise while lying down. You may resume a sexual relationship in  one month or when given the OK by your doctor.  You may return to work once you are cleared by your doctor.  Do not drive a car for 6 weeks or until released by your surgeon.  Do not drive while taking narcotics.  TED HOSE STOCKINGS Wear the elastic stockings on both legs for three weeks following surgery during the day. You may remove them at night for sleeping.  WEIGHT  BEARING Weight bearing as tolerated with assist device (walker, cane, etc) as directed, use it as long as suggested by your surgeon or therapist, typically at least 4-6 weeks.  POSTOPERATIVE CONSTIPATION PROTOCOL Constipation - defined medically as fewer than three stools per week and severe constipation as less than one stool per week.  One of the most common issues patients have following surgery is constipation.  Even if you have a regular bowel pattern at home, your normal regimen is likely to be disrupted due to multiple reasons following surgery.  Combination of anesthesia, postoperative narcotics, change in appetite and fluid intake all can affect your bowels.  In order to avoid complications following surgery, here are some recommendations in order to help you during your recovery period.  Colace (docusate) - Pick up an over-the-counter form of Colace or another stool softener and take twice a day as long as you are requiring postoperative pain medications.  Take with a full glass of water daily.  If you experience loose stools or diarrhea, hold the colace until you stool forms back up. If your symptoms do not get better within 1 week or if they get worse, check with your doctor. Dulcolax (bisacodyl) - Pick up over-the-counter and take as directed by the product packaging as needed to assist with the movement of your bowels.  Take with a full glass of water.  Use this product as needed if not relieved by Colace only.  MiraLax (polyethylene glycol) - Pick up over-the-counter to have on hand. MiraLax is a solution that will increase the amount of water in your bowels to assist with bowel movements.  Take as directed and can mix with a glass of water, juice, soda, coffee, or tea. Take if you go more than two days without a movement. Do not use MiraLax more than once per day. Call your doctor if you are still constipated or irregular after using this medication for 7 days in a row.  If you continue  to have problems with postoperative constipation, please contact the office for further assistance and recommendations.  If you experience "the worst abdominal pain ever" or develop nausea or vomiting, please contact the office immediatly for further recommendations for treatment.  ITCHING If you experience itching with your medications, try taking only a single pain pill, or even half a pain pill at a time.  You can also use Benadryl over the counter for itching or also to help with sleep.   MEDICATIONS See your medication summary on the "After Visit Summary" that the nursing staff will review with you prior to discharge.  You may have some home medications which will be placed on hold until you complete the course of blood thinner medication.  It is important for you to complete the blood thinner medication as prescribed by your surgeon.  Continue your approved medications as instructed at time of discharge.  PRECAUTIONS If you experience chest pain or shortness of breath - call 911 immediately for transfer to the hospital emergency department.  If you develop a fever greater that   101 F, purulent drainage from wound, increased redness or drainage from wound, foul odor from the wound/dressing, or calf pain - CONTACT YOUR SURGEON.                                                   FOLLOW-UP APPOINTMENTS Make sure you keep all of your appointments after your operation with your surgeon and caregivers. You should call the office at the above phone number and make an appointment for approximately two weeks after the date of your surgery or on the date instructed by your surgeon outlined in the "After Visit Summary".  RANGE OF MOTION AND STRENGTHENING EXERCISES  Rehabilitation of the knee is important following a knee injury or an operation. After just a few days of immobilization, the muscles of the thigh which control the knee become weakened and shrink (atrophy). Knee exercises are designed to build up  the tone and strength of the thigh muscles and to improve knee motion. Often times heat used for twenty to thirty minutes before working out will loosen up your tissues and help with improving the range of motion but do not use heat for the first two weeks following surgery. These exercises can be done on a training (exercise) mat, on the floor, on a table or on a bed. Use what ever works the best and is most comfortable for you Knee exercises include:  Leg Lifts - While your knee is still immobilized in a splint or cast, you can do straight leg raises. Lift the leg to 60 degrees, hold for 3 sec, and slowly lower the leg. Repeat 10-20 times 2-3 times daily. Perform this exercise against resistance later as your knee gets better.  Quad and Hamstring Sets - Tighten up the muscle on the front of the thigh (Quad) and hold for 5-10 sec. Repeat this 10-20 times hourly. Hamstring sets are done by pushing the foot backward against an object and holding for 5-10 sec. Repeat as with quad sets.  Leg Slides: Lying on your back, slowly slide your foot toward your buttocks, bending your knee up off the floor (only go as far as is comfortable). Then slowly slide your foot back down until your leg is flat on the floor again. Angel Wings: Lying on your back spread your legs to the side as far apart as you can without causing discomfort.  A rehabilitation program following serious knee injuries can speed recovery and prevent re-injury in the future due to weakened muscles. Contact your doctor or a physical therapist for more information on knee rehabilitation.   POST-OPERATIVE OPIOID TAPER INSTRUCTIONS: It is important to wean off of your opioid medication as soon as possible. If you do not need pain medication after your surgery it is ok to stop day one. Opioids include: Codeine, Hydrocodone(Norco, Vicodin), Oxycodone(Percocet, oxycontin) and hydromorphone amongst others.  Long term and even short term use of opiods can  cause: Increased pain response Dependence Constipation Depression Respiratory depression And more.  Withdrawal symptoms can include Flu like symptoms Nausea, vomiting And more Techniques to manage these symptoms Hydrate well Eat regular healthy meals Stay active Use relaxation techniques(deep breathing, meditating, yoga) Do Not substitute Alcohol to help with tapering If you have been on opioids for less than two weeks and do not have pain than it is ok to stop all together.  Plan to wean off of opioids This plan should start within one week post op of your joint replacement. Maintain the same interval or time between taking each dose and first decrease the dose.  Cut the total daily intake of opioids by one tablet each day Next start to increase the time between doses. The last dose that should be eliminated is the evening dose.   IF YOU ARE TRANSFERRED TO A SKILLED REHAB FACILITY If the patient is transferred to a skilled rehab facility following release from the hospital, a list of the current medications will be sent to the facility for the patient to continue.  When discharged from the skilled rehab facility, please have the facility set up the patient's [place] prior to being released. Also, the skilled facility will be responsible for providing the patient with their medications at time of release from the facility to include their pain medication, the muscle relaxants, and their blood thinner medication. If the patient is still at the rehab facility at time of the two week follow up appointment, the skilled rehab facility will also need to assist the patient in arranging follow up appointment in our office and any transportation needs.  MAKE SURE YOU:  Understand these instructions.  Get help right away if you are not doing well or get worse.   DENTAL ANTIBIOTICS:  In most cases prophylactic antibiotics for Dental procdeures after total joint surgery are  not necessary.  Exceptions are as follows:  1. History of prior total joint infection  2. Severely immunocompromised (Organ Transplant, cancer chemotherapy, Rheumatoid biologic medications such as Pettus)  3. Poorly controlled diabetes (A1C &gt; 8.0, blood glucose over 200)  If you have one of these conditions, contact your surgeon for an antibiotic prescription, prior to your dental procedure.    Pick up stool softner and laxative for home use following surgery while on pain medications. Do not submerge incision under water. Please use good hand washing techniques while changing dressing each day. May shower starting three days after surgery. Please use a clean towel to pat the incision dry following showers. Continue to use ice for pain and swelling after surgery. Do not use any lotions or creams on the incision until instructed by your surgeon.

## 2021-09-10 NOTE — Progress Notes (Signed)
Orthopedic Tech Progress Note Patient Details:  [name] [birth year] [id number]  [name], female   DOB: November 25, 1945, 76 y.o.   MRN: [id number]  Kennis Carina 09/10/2021, 7:54 PM Cpm removed

## 2021-09-10 NOTE — Anesthesia Procedure Notes (Signed)
Spinal  Patient location during procedure: OR Start time: 09/10/2021 2:12 PM End time: 09/10/2021 2:18 PM Reason for block: surgical anesthesia Staffing Performed: anesthesiologist  Anesthesiologist: Barnet Glasgow, MD Preanesthetic Checklist Completed: patient identified, IV checked, risks and benefits discussed, surgical consent, monitors and equipment checked, pre-op evaluation and timeout performed Spinal Block Patient position: sitting Prep: DuraPrep and site prepped and draped Patient monitoring: heart rate, cardiac monitor, continuous pulse ox and blood pressure Approach: midline Location: L3-4 Injection technique: single-shot Needle Needle type: Pencan  Needle gauge: 24 G Needle length: 10 cm Needle insertion depth: 7 cm Assessment Sensory level: T4 Events: CSF return Additional Notes  1Attempt (s). Pt tolerated procedure well.

## 2021-09-10 NOTE — H&P (Signed)
ADMISSION H&P   Subjective:  HPI: [name], 76 y.o. female presents today for right knee patellar revision versus component resection. She is just recently status post right knee arthroscopy with synovectomy, where the loose patellar component was discovered. Postoperatively she has had continued soreness, especially with walking.   Patient Active Problem List   Diagnosis Date Noted   Chronic pain 01/01/2021   Paresthesia 06/14/2019   Diabetic autonomic neuropathy associated with type 2 diabetes mellitus ([place]) 06/14/2019   Failed total knee arthroplasty ([place]) 03/09/2018   Aortic atherosclerosis ([place]) 12/08/2017   Hardening of the aorta (main artery of the heart) ([place]) 12/08/2017   History of total knee replacement, bilateral 11/04/2017   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[place]) 01/14/2015   Depression 02/28/2013   GERD (gastroesophageal reflux disease) 02/28/2013   Hiatal hernia with gastroesophageal reflux 02/28/2013   DDD (degenerative disc disease), lumbosacral 06/23/2012   Fibromyalgia syndrome 06/23/2012   Essential hypertension, benign 06/23/2012   Osteopenia 06/23/2012   Diabetes ([place]) 06/23/2012   Obesity due to excess calories 10/29/2010   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   PERSONAL HISTORY OF COLONIC POLYPS 10/11/2009    Past Medical History:  Diagnosis Date   Allergy    seasonal   Anxiety    Arthritis    [place]    Colon polyps    Complication of anesthesia    per pt, she woke up in the middle of last colon in 2014.   Depression    Diabetes mellitus, type 2 ([place]) 06/2010   Diabetic neuropathy ([place])    Diverticulosis    pt unaware   Esophageal stricture    Fibromyalgia    [place]    GERD (gastroesophageal reflux disease)    Hemorrhoids    Hiatal hernia    Hyperlipidemia    Hypertension    Menopause    Numbness    Peripheral vascular insufficiency ([facility])    Retinopathy    Bilateral   TIA (transient ischemic attack)    Tremor     Vitamin D deficiency     Past Surgical History:  Procedure Laterality Date   ABDOMINAL HYSTERECTOMY     partial and then total   ANKLE SURGERY Right    BACK SURGERY     COLONOSCOPY     ELBOW SURGERY     left   KNEE ARTHROSCOPY Right 09/12/2018   Procedure: ARTHROSCOPY KNEE; SYNOVECTOMY;  Surgeon: Gaynelle Arabian, MD;  Location: [facility];  Service: Orthopedics;  Laterality: Right;  75mn   REFRACTIVE SURGERY  2003   SPINAL CORD STIMULATOR INSERTION N/A 01/01/2021   Procedure: SPINAL CORD STIMULATOR PLACEMENT;  Surgeon: BMelina Schools MD;  [place] [facility]  Service: Orthopedics;  Laterality: N/A;   TOTAL KNEE ARTHROPLASTY     bilateral   TOTAL KNEE REVISION Right 03/09/2018   Procedure: RIGHT TOTAL KNEE REVISION;  Surgeon: AGaynelle Arabian MD;  Location: [facility];  Service: Orthopedics;  Laterality: Right;  1264m with abductor block   VESICOVAGINAL FISTULA CLOSURE W/ TAH  1981    Prior to Admission medications   Medication Sig Start Date End Date Taking? Authorizing Provider  ALPRAZolam (XDuanne Moron0.5 MG tablet Take 1-2 tablets (0.5-1 mg total) by mouth daily as needed for anxiety. Take 1-2 tabs daily as needed Patient taking differently: Take 0.5 mg by mouth at bedtime. 03/28/21  Yes Dettinger, JoFransisca KaufmannMD  ARIPiprazole (ABILIFY) 2 MG tablet TAKE 1 TABLET DAILY Patient taking differently: Take 1 mg by mouth at bedtime. 06/20/21  Yes Dettinger, Fransisca Kaufmann, MD  atorvastatin (LIPITOR) 10 MG tablet Take 1 tablet (10 mg total) by mouth daily. Needs office visit for further refills Patient taking differently: Take 10 mg by mouth every Monday, Wednesday, and Friday. Mornings. Needs office visit for further refills 08/18/21  Yes Dettinger, Fransisca Kaufmann, MD  Cholecalciferol (VITAMIN D) 50 MCG (2000 UT) tablet Take 2,000 Units by mouth every morning.   Yes [provider]  conjugated estrogens (PREMARIN) vaginal cream Place vaginally at bedtime. Patient taking differently: Place 1 applicator  vaginally at bedtime as needed (dryness). 06/24/20  Yes Dettinger, Fransisca Kaufmann, MD  docusate sodium (COLACE) 100 MG capsule Take 1 capsule (100 mg total) by mouth every 12 (twelve) hours. Patient taking differently: Take 100 mg by mouth daily. 01/04/21  Yes Sherwood Gambler, MD  escitalopram (LEXAPRO) 20 MG tablet Take 1 tablet (20 mg total) by mouth daily. Needs office visit for further refills Patient taking differently: Take 20 mg by mouth every morning. Needs office visit for further refills 08/18/21  Yes Dettinger, Fransisca Kaufmann, MD  ezetimibe (ZETIA) 10 MG tablet Take 1 tablet (10 mg total) by mouth daily. Patient taking differently: Take 10 mg by mouth every morning. 12/30/20  Yes Dettinger, Fransisca Kaufmann, MD  gabapentin (NEURONTIN) 600 MG tablet Take 1 tablet (600 mg total) by mouth in the morning, at noon, in the evening, and at bedtime. (NEEDS TO BE SEEN BEFORE NEXT REFILL) Patient taking differently: Take 600 mg by mouth 2 (two) times daily. (NEEDS TO BE SEEN BEFORE NEXT REFILL) 08/11/21  Yes Dettinger, Fransisca Kaufmann, MD  glipiZIDE (GLUCOTROL XL) 5 MG 24 hr tablet TAKE 1 TABLET DAILY WITH BREAKFAST Patient taking differently: 5 mg daily with breakfast. 09/03/21  Yes Dettinger, Fransisca Kaufmann, MD  HYDROcodone-acetaminophen (NORCO/VICODIN) 5-325 MG tablet Take 1 tablet by mouth daily as needed (pain). 08/20/21  Yes [provider]  Lidocaine HCl (ASPERCREME LIDOCAINE) 4 % LIQD Apply 1 spray topically at bedtime as needed (foot pain).   Yes [provider]  lisinopril-hydrochlorothiazide (ZESTORETIC) 20-25 MG tablet Take 1 tablet by mouth daily. (NEEDS TO BE SEEN BEFORE NEXT REFILL) Patient taking differently: Take 1 tablet by mouth every morning. (NEEDS TO BE SEEN BEFORE NEXT REFILL) 08/13/21  Yes Dettinger, Fransisca Kaufmann, MD  meclizine (ANTIVERT) 25 MG tablet TAKE ONE TABLET THREE TIMES DAILY AS NEEDED FOR DIZZINESS Patient taking differently: Take 25 mg by mouth 2 (two) times daily as needed for dizziness.  01/13/21  Yes Dettinger, Fransisca Kaufmann, MD  methocarbamol (ROBAXIN) 500 MG tablet Take 1 tablet (500 mg total) by mouth 2 (two) times daily as needed for muscle spasms. 01/04/21  Yes Sherwood Gambler, MD  nicotine polacrilex (NICORETTE) 2 MG gum Take 2 mg by mouth every 2 (two) hours.   Yes [provider]  pantoprazole (PROTONIX) 40 MG tablet Take 1 tablet (40 mg total) by mouth daily. Patient taking differently: Take 40 mg by mouth every morning. 12/30/20  Yes Dettinger, Fransisca Kaufmann, MD  polyethylene glycol (MIRALAX / GLYCOLAX) 17 g packet Take 17 g by mouth 2 (two) times daily as needed (constipation). 01/04/21  Yes Sherwood Gambler, MD  Semaglutide, 1 MG/DOSE, (OZEMPIC, 1 MG/DOSE,) 4 MG/3ML SOPN Inject 1 mg into the skin once a week. (NEEDS TO BE SEEN BEFORE NEXT REFILL) Patient taking differently: Inject 1 mg into the skin every Sunday. (NEEDS TO BE SEEN BEFORE NEXT REFILL) 08/18/21  Yes Dettinger, Fransisca Kaufmann, MD  SYNJARDY XR 25-1000 MG TB24 TAKE 1  TABLET DAILY Patient taking differently: Take 1 tablet by mouth every morning. 08/19/21  Yes Dettinger, Fransisca Kaufmann, MD  Tea Tree Oil (AUSTRALIAN TEA TREE) 100 % OIL Apply 1 application. topically at bedtime as needed (foot pain).   Yes [provider]  aspirin EC 81 MG tablet Take 81 mg by mouth daily. Swallow whole. Patient not taking: Reported on 09/05/2021    [provider]  blood glucose meter kit and supplies KIT Dispense based on patient and insurance preference. Use up to two times daily as directed. (Dx: type 2 DM - E11.9) 04/30/16   Chipper Herb, MD  Blood Glucose Monitoring Suppl (ONE TOUCH ULTRA 2) w/Device KIT CHECK BLOOD SUGAR UP TO TWICE DAILY Dx E11.9 03/25/20   Dettinger, Fransisca Kaufmann, MD  clopidogrel (PLAVIX) 75 MG tablet Take 1 tablet (75 mg total) by mouth daily. Patient not taking: Reported on 09/05/2021 12/30/20   Dettinger, Fransisca Kaufmann, MD  glucose blood (ONETOUCH ULTRA) test strip Check BS up to 2 times daily Dx 11.43 10/11/19    Dettinger, Fransisca Kaufmann, MD  OneTouch Delica Lancets 67T MISC CHECK BLOOD SUGAR UP TO TWICE DAILY Dx E11.9 10/11/19   Dettinger, Fransisca Kaufmann, MD    Allergies  Allergen Reactions   Penicillins Other (See Comments)    Blisters in mouth Did it involve swelling of the face/tongue/throat, SOB, or low BP? No Did it involve sudden or severe rash/hives, skin peeling, or any reaction on the inside of your mouth or nose? No Did you need to seek medical attention at a hospital or doctor's office? No When did it last happen?      Childhood allergy If all above answers are "NO", may proceed with cephalosporin use.    Codeine Nausea And Vomiting   Erythromycin Nausea And Vomiting   Fenofibrate Other (See Comments)    aching & edema     Lyrica [Pregabalin] Other (See Comments)    Edema in feet    Statins Other (See Comments)    Joints ache   Sulfa Antibiotics Nausea And Vomiting    Social History   Socioeconomic History   Marital status: Married    Spouse name: Kasandra Knudsen   Number of children: 2   Years of education: two years of college   Highest education level: Some college, no degree  Occupational History   Occupation: Retired  Tobacco Use   Smoking status: Former    Packs/day: 1.00    Years: 10.00    Pack years: 10.00    Types: Cigarettes    Start date: 11/07/1975    Quit date: 06/24/2010    Years since quitting: 11.2   Smokeless tobacco: Never  Vaping Use   Vaping Use: Never used  Substance and Sexual Activity   Alcohol use: Not Currently   Drug use: No   Sexual activity: Yes    Birth control/protection: Surgical  Other Topics Concern   Not on file  Social History Narrative   Lives at home with her husband.   Left-handed.   One can of Diet Coke per day.   Social Determinants of Health   Financial Resource Strain: Low Risk    Difficulty of Paying Living Expenses: Not hard at all  Food Insecurity: No Food Insecurity   Worried About Charity fundraiser in the Last Year: Never  true   [place] in the Last Year: Never true  Transportation Needs: No Transportation Needs   Lack of Transportation (Medical): No  Lack of Transportation (Non-Medical): No  Physical Activity: Inactive   Days of Exercise per Week: 0 days   Minutes of Exercise per Session: 0 min  Stress: No Stress Concern Present   Feeling of Stress : Not at all  Social Connections: Socially Integrated   Frequency of Communication with Friends and Family: More than three times a week   Frequency of Social Gatherings with Friends and Family: More than three times a week   Attends Religious Services: More than 4 times per year   Active Member of [employer] or Organizations: Yes   Attends Music therapist: More than 4 times per year   Marital Status: Married  Human resources officer Violence: Not At Risk   Fear of Current or Ex-Partner: No   Emotionally Abused: No   Physically Abused: No   Sexually Abused: No    Tobacco Use: Medium Risk   Smoking Tobacco Use: Former   Smokeless Tobacco Use: Never   Passive Exposure: Not on file   Social History   Substance and Sexual Activity  Alcohol Use Not Currently    Family History  Problem Relation Age of Onset   Diabetes Mother    Heart failure Mother    Hypertension Mother    Cirrhosis Father    Cirrhosis Brother     Review of Systems  Constitutional:  Negative for chills and fever.  HENT:  Negative for congestion, sore throat and tinnitus.   Eyes:  Negative for double vision, photophobia and pain.  Respiratory:  Negative for cough, shortness of breath and wheezing.   Cardiovascular:  Negative for chest pain, palpitations and orthopnea.  Gastrointestinal:  Negative for heartburn, nausea and vomiting.  Genitourinary:  Negative for dysuria, frequency and urgency.  Musculoskeletal:  Positive for joint pain.  Neurological:  Negative for dizziness, weakness and headaches.   Objective:  Physical Exam: The patient is a pleasant,  well-developed female alert and oriented in no apparent distress.  Right knee exam: Patient has well-healed incisions, no erythema, no exudate, dry, and nontender. Sutures are removed. There is no effusion. The portal sites are well healed. The range of motion: 0 to 115 degrees. Mild crepitus on range of motion of the knee.  The patient's sensation and motor function are intact in their lower extremities. Their distal pulses are 2+. The bilateral calves are soft and nontender.    Assessment/Plan:  ASSESSMENT  Status post right knee arthroscopy with synovectomy.      PLAN  We discussed the patient's presenting complaints, history, and treatment options. At the time of the surgery, we noted that her patellar component was loose. When she had her knee revision back in 2019 we did not revise the patellar component as it was stable and not worn. Unfortunately, in the 4-year interim, it has loosened. She is going to need surgery to remove the polyethylene. If the patella is thick enough, we could place a new prosthesis. If not, then we would just leave the bony shell intact without a new prosthesis which will be an intraoperative decision. We discussed that in detail. Risks and benefits of the surgery were discussed with the patient and she elects to proceed.  Theresa Duty, PA-C Orthopedic Surgery [facility] [place]

## 2021-09-10 NOTE — Anesthesia Procedure Notes (Signed)
Date/Time: 09/10/2021 2:14 PM Performed by: Sharlette Dense, CRNA Oxygen Delivery Method: Simple face mask

## 2021-09-10 NOTE — Plan of Care (Signed)
  Problem: Education: Goal: Knowledge of General Education information will improve Description Including pain rating scale, medication(s)/side effects and non-pharmacologic comfort measures Outcome: Progressing   Problem: Health Behavior/Discharge Planning: Goal: Ability to manage health-related needs will improve Outcome: Progressing   

## 2021-09-10 NOTE — Progress Notes (Signed)
Orthopedic Tech Progress Note Patient Details:  [name] 1945/05/11 [id number]  Patient ID: [name], female   DOB: 1946-01-20, 76 y.o.   MRN: [id number]  Kennis Carina 09/10/2021, 4:04 PM Cpm applied in pacu

## 2021-09-10 NOTE — Anesthesia Postprocedure Evaluation (Signed)
Anesthesia Post Note  Patient: [name]  Procedure(s) Performed: Resection arthroplasty right patella (Right: Knee)     Patient location during evaluation: Nursing Unit Anesthesia Type: Regional and Spinal Level of consciousness: oriented and awake and alert Pain management: pain level controlled Vital Signs Assessment: post-procedure vital signs reviewed and stable Respiratory status: spontaneous breathing and respiratory function stable Cardiovascular status: blood pressure returned to baseline and stable Postop Assessment: no headache, no backache, no apparent nausea or vomiting and patient able to bend at knees Anesthetic complications: no   No notable events documented.  Last Vitals:  Vitals:   09/10/21 1615 09/10/21 1630  BP: (!) 115/54 (!) 114/50  Pulse: 77 72  Resp: 16 13  Temp:    SpO2: 94% 94%    Last Pain:  Vitals:   09/10/21 1630  TempSrc:   PainSc: 0-No pain                 Barnet Glasgow

## 2021-09-10 NOTE — Progress Notes (Signed)
Assisted Dr. Valma Cava with right, adductor canal, ultrasound guided block. Side rails up, monitors on throughout procedure. See vital signs in flow sheet. Tolerated Procedure well.

## 2021-09-11 ENCOUNTER — Other Ambulatory Visit: Payer: Self-pay | Admitting: Family Medicine

## 2021-09-11 ENCOUNTER — Encounter (HOSPITAL_COMMUNITY): Payer: Self-pay | Admitting: Orthopedic Surgery

## 2021-09-11 DIAGNOSIS — I10 Essential (primary) hypertension: Secondary | ICD-10-CM

## 2021-09-11 DIAGNOSIS — E1151 Type 2 diabetes mellitus with diabetic peripheral angiopathy without gangrene: Secondary | ICD-10-CM | POA: Diagnosis present

## 2021-09-11 DIAGNOSIS — J302 Other seasonal allergic rhinitis: Secondary | ICD-10-CM | POA: Diagnosis present

## 2021-09-11 DIAGNOSIS — Z7985 Long-term (current) use of injectable non-insulin antidiabetic drugs: Secondary | ICD-10-CM | POA: Diagnosis not present

## 2021-09-11 DIAGNOSIS — E6609 Other obesity due to excess calories: Secondary | ICD-10-CM | POA: Diagnosis present

## 2021-09-11 DIAGNOSIS — G8929 Other chronic pain: Secondary | ICD-10-CM | POA: Diagnosis present

## 2021-09-11 DIAGNOSIS — E11319 Type 2 diabetes mellitus with unspecified diabetic retinopathy without macular edema: Secondary | ICD-10-CM | POA: Diagnosis present

## 2021-09-11 DIAGNOSIS — I700 Atherosclerosis of aorta: Secondary | ICD-10-CM | POA: Diagnosis present

## 2021-09-11 DIAGNOSIS — E11610 Type 2 diabetes mellitus with diabetic neuropathic arthropathy: Secondary | ICD-10-CM

## 2021-09-11 DIAGNOSIS — Z7982 Long term (current) use of aspirin: Secondary | ICD-10-CM | POA: Diagnosis not present

## 2021-09-11 DIAGNOSIS — E559 Vitamin D deficiency, unspecified: Secondary | ICD-10-CM | POA: Diagnosis present

## 2021-09-11 DIAGNOSIS — Y792 Prosthetic and other implants, materials and accessory orthopedic devices associated with adverse incidents: Secondary | ICD-10-CM | POA: Diagnosis present

## 2021-09-11 DIAGNOSIS — M797 Fibromyalgia: Secondary | ICD-10-CM | POA: Diagnosis present

## 2021-09-11 DIAGNOSIS — Z6832 Body mass index (BMI) 32.0-32.9, adult: Secondary | ICD-10-CM | POA: Diagnosis not present

## 2021-09-11 DIAGNOSIS — Z90710 Acquired absence of both cervix and uterus: Secondary | ICD-10-CM | POA: Diagnosis not present

## 2021-09-11 DIAGNOSIS — T84032A Mechanical loosening of internal right knee prosthetic joint, initial encounter: Secondary | ICD-10-CM | POA: Diagnosis present

## 2021-09-11 DIAGNOSIS — Z79890 Hormone replacement therapy (postmenopausal): Secondary | ICD-10-CM | POA: Diagnosis not present

## 2021-09-11 DIAGNOSIS — Z8719 Personal history of other diseases of the digestive system: Secondary | ICD-10-CM | POA: Diagnosis not present

## 2021-09-11 DIAGNOSIS — K219 Gastro-esophageal reflux disease without esophagitis: Secondary | ICD-10-CM | POA: Diagnosis present

## 2021-09-11 DIAGNOSIS — F32A Depression, unspecified: Secondary | ICD-10-CM | POA: Diagnosis present

## 2021-09-11 DIAGNOSIS — E1143 Type 2 diabetes mellitus with diabetic autonomic (poly)neuropathy: Secondary | ICD-10-CM | POA: Diagnosis present

## 2021-09-11 DIAGNOSIS — Z7984 Long term (current) use of oral hypoglycemic drugs: Secondary | ICD-10-CM | POA: Diagnosis not present

## 2021-09-11 DIAGNOSIS — Z96652 Presence of left artificial knee joint: Secondary | ICD-10-CM | POA: Diagnosis present

## 2021-09-11 DIAGNOSIS — Z8673 Personal history of transient ischemic attack (TIA), and cerebral infarction without residual deficits: Secondary | ICD-10-CM | POA: Diagnosis not present

## 2021-09-11 DIAGNOSIS — Z9682 Presence of neurostimulator: Secondary | ICD-10-CM | POA: Diagnosis not present

## 2021-09-11 LAB — GLUCOSE, CAPILLARY: Glucose-Capillary: 332 mg/dL — ABNORMAL HIGH (ref 70–99)

## 2021-09-11 LAB — CBC
HCT: 34.3 % — ABNORMAL LOW (ref 36.0–46.0)
Hemoglobin: 10 g/dL — ABNORMAL LOW (ref 12.0–15.0)
MCH: 22.5 pg — ABNORMAL LOW (ref 26.0–34.0)
MCHC: 29.2 g/dL — ABNORMAL LOW (ref 30.0–36.0)
MCV: 77.3 fL — ABNORMAL LOW (ref 80.0–100.0)
Platelets: 242 10*3/uL (ref 150–400)
RBC: 4.44 MIL/uL (ref 3.87–5.11)
RDW: 18.1 % — ABNORMAL HIGH (ref 11.5–15.5)
WBC: 9.9 10*3/uL (ref 4.0–10.5)
nRBC: 0 % (ref 0.0–0.2)

## 2021-09-11 LAB — BASIC METABOLIC PANEL
Anion gap: 10 (ref 5–15)
BUN: 19 mg/dL (ref 8–23)
CO2: 22 mmol/L (ref 22–32)
Calcium: 8.7 mg/dL — ABNORMAL LOW (ref 8.9–10.3)
Chloride: 103 mmol/L (ref 98–111)
Creatinine, Ser: 0.89 mg/dL (ref 0.44–1.00)
GFR, Estimated: >60 mL/min (ref >60)
Glucose, Bld: 343 mg/dL — ABNORMAL HIGH (ref 70–99)
Potassium: 4.5 mmol/L (ref 3.5–5.1)
Sodium: 135 mmol/L (ref 135–145)

## 2021-09-11 MED ORDER — ASPIRIN 325 MG PO TBEC
325.0000 mg | DELAYED_RELEASE_TABLET | Freq: Every day | ORAL | 0 refills | Status: AC
Start: 2021-09-11 — End: 2021-10-01

## 2021-09-11 MED ORDER — METHOCARBAMOL 500 MG PO TABS: 500.0000 mg | ORAL_TABLET | Freq: Four times a day (QID) | ORAL | 0 refills | Status: DC | PRN

## 2021-09-11 NOTE — Progress Notes (Signed)
Subjective: 1 Day Post-Op Procedure(s) (LRB): Resection arthroplasty right patella (Right) Patient seen in rounds by Dr. Wynelle Link. Patient is well, and has had no acute complaints or problems. Foley cath removed this AM. Denies SOB or chest pain. Patient reports pain as mild.   We will start therapy today.  Objective: Vital signs in last 24 hours: Temp:  [97.6 F (36.4 C)-98.4 F (36.9 C)] 97.6 F (36.4 C) (06/08 0607) Pulse Rate:  [65-98] 98 (06/08 0607) Resp:  [9-22] 18 (06/08 0607) BP: (96-139)/(50-81) 139/75 (06/08 0607) SpO2:  [91 %-100 %] 96 % (06/08 0607) Weight:  [91.6 kg] 91.6 kg (06/07 1257)  Intake/Output from previous day:  Intake/Output Summary (Last 24 hours) at 09/11/2021 0716 Last data filed at 09/11/2021 0610 Gross per 24 hour  Intake 1262.25 ml  Output 1775 ml  Net -512.75 ml     Intake/Output this shift: No intake/output data recorded.  Labs: Recent Labs    09/11/21 0329  HGB 10.0*   Recent Labs    09/11/21 0329  WBC 9.9  RBC 4.44  HCT 34.3*  PLT 242   Recent Labs    09/11/21 0329  NA 135  K 4.5  CL 103  CO2 22  BUN 19  CREATININE 0.89  GLUCOSE 343*  CALCIUM 8.7*   No results for input(s): "LABPT", "INR" in the last 72 hours.  Exam: General - Patient is Alert and Oriented Extremity - Neurologically intact Neurovascular intact Sensation intact distally Dorsiflexion/Plantar flexion intact Dressing - dressing C/D/I Motor Function - intact, moving foot and toes well on exam.  Past Medical History:  Diagnosis Date   Allergy    seasonal   Anxiety    Arthritis    [place]    Colon polyps    Complication of anesthesia    per pt, she woke up in the middle of last colon in 2014.   Depression    Diabetes mellitus, type 2 ([place]) 06/2010   Diabetic neuropathy ([place])    Diverticulosis    pt unaware   Esophageal stricture    Fibromyalgia    [place]    GERD (gastroesophageal reflux disease)    Hemorrhoids    Hiatal hernia     Hyperlipidemia    Hypertension    Menopause    Numbness    Peripheral vascular insufficiency ([facility])    Retinopathy    Bilateral   TIA (transient ischemic attack)    Tremor    Vitamin D deficiency     Assessment/Plan: 1 Day Post-Op Procedure(s) (LRB): Resection arthroplasty right patella (Right) Principal Problem:   Failed total knee arthroplasty ([place])  Estimated body mass index is 32.6 kg/m as calculated from the following:   Height as of this encounter: '5\' 6"'$  (1.676 m).   Weight as of this encounter: 91.6 kg. Advance diet Up with therapy D/C IV fluids  Patient's anticipated LOS is less than 2 midnights, meeting these requirements: - Lives within 1 hour of care - Has a competent adult at home to recover with post-op - NO history of  - Chronic pain requiring opioids  - Coronary Artery Disease  - Heart failure  - Heart attack  - Stroke  - DVT/VTE  - Cardiac arrhythmia  - Respiratory Failure/COPD  - Renal failure  - Anemia  - Advanced Liver disease  DVT Prophylaxis - Plavix and Aspirin Weight bearing as tolerated. Start physical therapy.  Plan is to go Home after hospital stay. Expected discharge today after one session of  physical therapy. She will do a HEP once discharged. Follow-up in clinic in 2 weeks.  Rainey Pines, PA-C Orthopedic Surgery [phone] 09/11/2021, 7:16 AM

## 2021-09-11 NOTE — Plan of Care (Signed)
  Problem: Activity: Goal: Risk for activity intolerance will decrease Outcome: Progressing   Problem: Pain Managment: Goal: General experience of comfort will improve Outcome: Progressing   Problem: Safety: Goal: Ability to remain free from injury will improve Outcome: Progressing   

## 2021-09-11 NOTE — Progress Notes (Signed)
Reviewed dc instructions with patient. Pt verbalized understanding.

## 2021-09-11 NOTE — TOC Transition Note (Signed)
Transition of Care [facility]) - CM/SW Discharge Note   Patient Details  Name: [name] MRN: [id number] Date of Birth: 1945-09-10  [facility] CM/SW Contact:  Lennart Pall, LCSW Phone Number: 09/11/2021, 9:29 AM   Clinical Narrative:    Met with pt and confirming she has all needed DME at home.  Plan for HEP.  No TOC needs.   Final next level of care: Home/Self Care Barriers to Discharge: No Barriers Identified   Patient Goals and CMS Choice Patient states their goals for this hospitalization and ongoing recovery are:: return home      Discharge Placement                       Discharge Plan and Services                DME Arranged: N/A DME Agency: NA                  Social Determinants of Health (SDOH) Interventions     Readmission Risk Interventions    09/11/2021    9:28 AM  Readmission Risk Prevention Plan  Post Dischage Appt Complete  Medication Screening Complete  Transportation Screening Complete

## 2021-09-11 NOTE — Evaluation (Signed)
Physical Therapy Evaluation Patient Details Name: [name] MRN: [id number] DOB: 10/04/1945 Today's Date: 09/11/2021  History of Present Illness  Pt s/p R TKR patellar resection and with hx of DM, fibromyalgia, TIA , Retinopathy, Tremor, back surgery and s/p spinal cord stimulator  Clinical Impression  Pt admitted as above and presenting with functional mobility limitations 2* decreased R LE strength/ROM and post op pain.  Pt currently mobilizing at sup/min guard assist including bed mobility, ambulation and stairs.  Pt performed HEP with written instruction provided.  Pt eager for dc home this date.     Recommendations for follow up therapy are one component of a multi-disciplinary discharge planning process, led by the attending physician.  Recommendations may be updated based on patient status, additional functional criteria and insurance authorization.  Follow Up Recommendations Follow physician's recommendations for discharge plan and follow up therapies    Assistance Recommended at Discharge Intermittent Supervision/Assistance  Patient can return home with the following  A little help with walking and/or transfers;A little help with bathing/dressing/bathroom;Assist for transportation;Assistance with cooking/housework    Equipment Recommendations None recommended by PT  Recommendations for Other Services       Functional Status Assessment Patient has had a recent decline in their functional status and demonstrates the ability to make significant improvements in function in a reasonable and predictable amount of time.     Precautions / Restrictions Precautions Precautions: Fall Restrictions Weight Bearing Restrictions: No      Mobility  Bed Mobility Overal bed mobility: Modified Independent             General bed mobility comments: no physical assist    Transfers Overall transfer level: Needs assistance Equipment used: Rolling walker (2 wheels) Transfers: Sit  to/from Stand Sit to Stand: Min guard, Supervision           General transfer comment: cues for LE management and use of UEs to self assist    Ambulation/Gait Ambulation/Gait assistance: Min guard, Supervision Gait Distance (Feet): 140 Feet Assistive device: Rolling walker (2 wheels) Gait Pattern/deviations: Step-to pattern, Step-through pattern, Decreased step length - right, Decreased step length - left, Shuffle, Trunk flexed       General Gait Details: min cues for posture and position from RW  Stairs Stairs: Yes Stairs assistance: Min guard Stair Management: No rails Number of Stairs: 2 General stair comments: cues for sequence, use of door frame and RW at top of 2 steps to assist  Wheelchair Mobility    Modified Rankin (Stroke Patients Only)       Balance Overall balance assessment: Mild deficits observed, not formally tested                                           Pertinent Vitals/Pain Pain Assessment Pain Assessment: 0-10 Pain Score: 2  Pain Location: R knee Pain Descriptors / Indicators: Aching, Sore Pain Intervention(s): Limited activity within patient's tolerance, Monitored during session, Premedicated before session, Ice applied    Home Living Family/patient expects to be discharged to:: Private residence Living Arrangements: Spouse/significant other Available Help at Discharge: Family;Available 24 hours/day Type of Home: House Home Access: Stairs to enter Entrance Stairs-Rails: None Entrance Stairs-Number of Steps: 2 Alternate Level Stairs-Number of Steps: 14 Home Layout: Two level;Able to live on main level with bedroom/bathroom Home Equipment: Rolling Walker (2 wheels);Shower seat;Cane - single point Additional Comments: sleeps in  recliner    Prior Function Prior Level of Function : Independent/Modified Independent                     Hand Dominance   Dominant Hand: Right    Extremity/Trunk Assessment    Upper Extremity Assessment Upper Extremity Assessment: Overall WFL for tasks assessed    Lower Extremity Assessment Lower Extremity Assessment: RLE deficits/detail RLE Deficits / Details: AAROM at knee -5 - 105 with 3/5 quads    Cervical / Trunk Assessment Cervical / Trunk Assessment: Normal  Communication   Communication: No difficulties  Cognition Arousal/Alertness: Awake/alert Behavior During Therapy: WFL for tasks assessed/performed Overall Cognitive Status: Within Functional Limits for tasks assessed                                          General Comments      Exercises Total Joint Exercises Ankle Circles/Pumps: AROM, Both, 15 reps, Supine Quad Sets: AROM, Both, 10 reps, Supine Heel Slides: AAROM, Right, 15 reps, Supine Hip ABduction/ADduction: AROM, Right, 10 reps, Supine Straight Leg Raises: AAROM, AROM, Right, 15 reps, Supine Long Arc Quad: AROM, Right, 10 reps, Seated   Assessment/Plan    PT Assessment Patient needs continued PT services  PT Problem List Decreased strength;Decreased range of motion;Decreased activity tolerance;Decreased mobility;Decreased knowledge of use of DME;Pain       PT Treatment Interventions DME instruction;Gait training;Stair training;Functional mobility training;Therapeutic activities;Therapeutic exercise;Balance training;Patient/family education    PT Goals (Current goals can be found in the Care Plan section)  Acute Rehab PT Goals Patient Stated Goal: Regain IND PT Goal Formulation: With patient Time For Goal Achievement: 09/18/21 Potential to Achieve Goals: Good    Frequency Min 1X/week     Co-evaluation               AM-PAC PT "6 Clicks" Mobility  Outcome Measure Help needed turning from your back to your side while in a flat bed without using bedrails?: A Little Help needed moving from lying on your back to sitting on the side of a flat bed without using bedrails?: A Little Help needed moving to  and from a bed to a chair (including a wheelchair)?: A Little Help needed standing up from a chair using your arms (e.g., wheelchair or bedside chair)?: A Little Help needed to walk in hospital room?: A Little Help needed climbing 3-5 steps with a railing? : A Little 6 Click Score: 18    End of Session Equipment Utilized During Treatment: Gait belt Activity Tolerance: Patient tolerated treatment well Patient left: in chair;with call bell/phone within reach;with chair alarm set;with family/visitor present Nurse Communication: Mobility status PT Visit Diagnosis: Difficulty in walking, not elsewhere classified (R26.2)    Time: 0827-0905 PT Time Calculation (min) (ACUTE ONLY): 38 min   Charges:   PT Evaluation $PT Eval Low Complexity: 1 Low PT Treatments $Gait Training: 8-22 mins $Therapeutic Exercise: 8-22 mins        Debe Coder PT Acute Rehabilitation Services Pager [phone] Office [phone]   Aarthi Uyeno 09/11/2021, 10:43 AM

## 2021-09-12 NOTE — Discharge Summary (Signed)
Physician Discharge Summary   [name] MRN: [id number] DOB/AGE: March 30, 1946 76 y.o.  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   Depression    Diabetes mellitus, type 2 ([place]) 06/2010   Diabetic neuropathy ([place])    Diverticulosis    pt unaware   Esophageal stricture    Fibromyalgia    [place]    GERD (gastroesophageal reflux disease)    Hemorrhoids    Hiatal hernia    Hyperlipidemia    Hypertension    Menopause    Numbness    Peripheral vascular insufficiency ([facility])    Retinopathy    Bilateral   TIA (transient ischemic attack)    Tremor    Vitamin D deficiency    Discharge Diagnoses:   Principal Problem:   Failed total knee arthroplasty ([place])  Estimated body mass index is 32.6 kg/m as calculated from the following:   Height as of this encounter: 5' 6"  (1.676 m).   Weight as of this encounter: 91.6 kg.  Procedure:  Procedure(s) (LRB): Resection arthroplasty right patella (Right)   Consults: None  HPI: The patient is a 76 year old female who had a right total knee arthroplasty revision done in 2019. Her patellar component was stable at the time and it was not revised.  She presented to the office several months ago with painful popping of the knee consistent with patellar clunk syndrome.  She was taken for an arthroscopy and synovectomy 2 weeks ago when at the time of the arthroscopy, was noted to have loosening of the patellar component.  She is taken to the operating room now for resection versus revision of the patellar component.  Laboratory Data: Admission on 09/10/2021, Discharged on 09/11/2021  Component Date Value Ref Range Status    Glucose-Capillary 09/10/2021 155 (H)  70 - 99 mg/dL Final   Glucose reference range applies only to samples taken after fasting for at least 8 hours.   Comment 1 09/10/2021 Notify RN   Final   Comment 2 09/10/2021 Document in Chart   Final   Glucose-Capillary 09/10/2021 119 (H)  70 - 99 mg/dL Final   Glucose reference range applies only to samples taken after fasting for at least 8 hours.   WBC 09/11/2021 9.9  4.0 - 10.5 K/uL Final   RBC 09/11/2021 4.44  3.87 - 5.11 MIL/uL Final   Hemoglobin 09/11/2021 10.0 (L)  12.0 - 15.0 g/dL Final   HCT 09/11/2021 34.3 (L)  36.0 - 46.0 % Final   MCV 09/11/2021 77.3 (L)  80.0 - 100.0 fL Final   MCH 09/11/2021 22.5 (L)  26.0 - 34.0 pg Final   MCHC 09/11/2021 29.2 (L)  30.0 - 36.0 g/dL Final   RDW 09/11/2021 18.1 (H)  11.5 - 15.5 % Final   Platelets 09/11/2021 242  150 - 400 K/uL Final   nRBC 09/11/2021 0.0  0.0 - 0.2 % Final   Performed at [facility], [place] [address], [place], [place] [address]   Sodium 09/11/2021 135  135 - 145 mmol/L Final   Potassium 09/11/2021 4.5  3.5 - 5.1 mmol/L Final  Chloride 09/11/2021 103  98 - 111 mmol/L Final   CO2 09/11/2021 22  22 - 32 mmol/L Final   Glucose, Bld 09/11/2021 343 (H)  70 - 99 mg/dL Final   Glucose reference range applies only to samples taken after fasting for at least 8 hours.   BUN 09/11/2021 19  8 - 23 mg/dL Final   Creatinine, Ser 09/11/2021 0.89  0.44 - 1.00 mg/dL Final   Calcium 09/11/2021 8.7 (L)  8.9 - 10.3 mg/dL Final   GFR, Estimated 09/11/2021 >60  >60 mL/min Final   Comment: (NOTE) Calculated using the CKD-EPI Creatinine Equation (2021)    Anion gap 09/11/2021 10  5 - 15 Final   Performed at [facility], [place] [address] [place], [place] [address]   Glucose-Capillary 09/10/2021 279 (H)  70 - 99 mg/dL Final   Glucose reference range applies only to samples taken after fasting for at least 8 hours.   Glucose-Capillary 09/11/2021 332 (H)  70 - 99 mg/dL  Final   Glucose reference range applies only to samples taken after fasting for at least 8 hours.  Hospital Outpatient Visit on 09/05/2021  Component Date Value Ref Range Status   Sodium 09/05/2021 138  135 - 145 mmol/L Final   Potassium 09/05/2021 4.5  3.5 - 5.1 mmol/L Final   Chloride 09/05/2021 103  98 - 111 mmol/L Final   CO2 09/05/2021 27  22 - 32 mmol/L Final   Glucose, Bld 09/05/2021 130 (H)  70 - 99 mg/dL Final   Glucose reference range applies only to samples taken after fasting for at least 8 hours.   BUN 09/05/2021 18  8 - 23 mg/dL Final   Creatinine, Ser 09/05/2021 0.78  0.44 - 1.00 mg/dL Final   Calcium 09/05/2021 9.0  8.9 - 10.3 mg/dL Final   GFR, Estimated 09/05/2021 >60  >60 mL/min Final   Comment: (NOTE) Calculated using the CKD-EPI Creatinine Equation (2021)    Anion gap 09/05/2021 8  5 - 15 Final   Performed at [facility], [place] [address], [place], [place] [address]   WBC 09/05/2021 7.9  4.0 - 10.5 K/uL Final   RBC 09/05/2021 4.89  3.87 - 5.11 MIL/uL Final   Hemoglobin 09/05/2021 11.1 (L)  12.0 - 15.0 g/dL Final   HCT 09/05/2021 37.6  36.0 - 46.0 % Final   MCV 09/05/2021 76.9 (L)  80.0 - 100.0 fL Final   MCH 09/05/2021 22.7 (L)  26.0 - 34.0 pg Final   MCHC 09/05/2021 29.5 (L)  30.0 - 36.0 g/dL Final   RDW 09/05/2021 18.5 (H)  11.5 - 15.5 % Final   Platelets 09/05/2021 260  150 - 400 K/uL Final   nRBC 09/05/2021 0.0  0.0 - 0.2 % Final   Performed at [facility], [place] [address], [place], [place] [address]   MRSA, PCR 09/05/2021 NEGATIVE  NEGATIVE Final   Staphylococcus aureus 09/05/2021 NEGATIVE  NEGATIVE Final   Comment: (NOTE) The Xpert SA Assay (FDA approved for NASAL specimens in patients 76 years of age and older), is one component of a comprehensive surveillance program. It is not intended to diagnose infection nor to guide or monitor treatment. Performed at [facility], [place] [address], [place],  [address]    Glucose-Capillary 09/05/2021 136 (H)  70 - 99 mg/dL Final   Glucose reference range applies only to samples taken after fasting for at least 8 hours.   Hgb A1c MFr  Bld 09/05/2021 7.5 (H)  4.8 - 5.6 % Final   Comment: (NOTE) Pre diabetes:          5.7%-6.4%  Diabetes:              >6.4%  Glycemic control for   <7.0% adults with diabetes    Mean Plasma Glucose 09/05/2021 168.55  mg/dL Final   Performed at [facility], [place] [address] [place], [place] [address]     X-Rays:No results found.  EKG: Orders placed or performed during the hospital encounter of 09/05/21   EKG 12 lead per protocol   EKG 12 lead per protocol     Hospital Course: [name] is a 76 y.o. who was admitted to [facility]. They were brought to the operating room on 09/10/2021 and underwent Procedure(s): Resection arthroplasty right patella.  Patient tolerated the procedure well and was later transferred to the recovery room and then to the orthopaedic floor for postoperative care. They were given PO and IV analgesics for pain control following their surgery. They were given 24 hours of postoperative antibiotics of  Anti-infectives (From admission, onward)    Start     Dose/Rate Route Frequency Ordered Stop   09/11/21 0600  ceFAZolin (ANCEF) IVPB 2g/100 mL premix        2 g 200 mL/hr over 30 Minutes Intravenous On call to O.R. 09/10/21 1244 09/10/21 1425   09/10/21 2000  ceFAZolin (ANCEF) IVPB 2g/100 mL premix        2 g 200 mL/hr over 30 Minutes Intravenous Every 6 hours 09/10/21 1735 09/11/21 0237      and started on DVT prophylaxis in the form of  Plavix and Aspirin .   PT and OT were ordered for total joint protocol. Discharge planning consulted to help with postop disposition and equipment needs.  Patient had a good night on the evening of surgery. Patient was seen during rounds and was ready to go home pending progress with therapy.  Patient worked with  therapy on POD #1 and was meeting her goals. Patient was discharged to home later that day in stable condition.  Diet: Cardiac diet and Diabetic diet Activity: WBAT Follow-up: in 2 weeks Disposition: Home Discharged Condition: stable   Discharge Instructions     Call MD / Call 911   Complete by: As directed    If you experience chest pain or shortness of breath, CALL 911 and be transported to the hospital emergency room.  If you develope a fever above 101 F, pus (white drainage) or increased drainage or redness at the wound, or calf pain, call your surgeon's office.   Change dressing   Complete by: As directed    You may remove the bulky bandage (ACE wrap and gauze) two days after surgery. You will have an adhesive waterproof bandage underneath. Leave this in place until your first follow-up appointment.   Constipation Prevention   Complete by: As directed    Drink plenty of fluids.  Prune juice may be helpful.  You may use a stool softener, such as Colace (over the counter) 100 mg twice a day.  Use MiraLax (over the counter) for constipation as needed.   Diet - low sodium heart healthy   Complete by: As directed    Do not put a pillow under the knee. Place it under the heel.   Complete by: As directed    Driving restrictions   Complete by: As directed  No driving for two weeks   Post-operative opioid taper instructions:   Complete by: As directed    POST-OPERATIVE OPIOID TAPER INSTRUCTIONS: It is important to wean off of your opioid medication as soon as possible. If you do not need pain medication after your surgery it is ok to stop day one. Opioids include: Codeine, Hydrocodone(Norco, Vicodin), Oxycodone(Percocet, oxycontin) and hydromorphone amongst others.  Long term and even short term use of opiods can cause: Increased pain response Dependence Constipation Depression Respiratory depression And more.  Withdrawal symptoms can include Flu like symptoms Nausea,  vomiting And more Techniques to manage these symptoms Hydrate well Eat regular healthy meals Stay active Use relaxation techniques(deep breathing, meditating, yoga) Do Not substitute Alcohol to help with tapering If you have been on opioids for less than two weeks and do not have pain than it is ok to stop all together.  Plan to wean off of opioids This plan should start within one week post op of your joint replacement. Maintain the same interval or time between taking each dose and first decrease the dose.  Cut the total daily intake of opioids by one tablet each day Next start to increase the time between doses. The last dose that should be eliminated is the evening dose.      TED hose   Complete by: As directed    Use stockings (TED hose) for three weeks on both leg(s).  You may remove them at night for sleeping.   Weight bearing as tolerated   Complete by: As directed       Allergies as of 09/11/2021       Reactions   Penicillins Other (See Comments)   Blisters in mouth Did it involve swelling of the face/tongue/throat, SOB, or low BP? No Did it involve sudden or severe rash/hives, skin peeling, or any reaction on the inside of your mouth or nose? No Did you need to seek medical attention at a hospital or doctor's office? No When did it last happen?      Childhood allergy If all above answers are "NO", may proceed with cephalosporin use.   Codeine Nausea And Vomiting   Erythromycin Nausea And Vomiting   Fenofibrate Other (See Comments)   aching & edema    Lyrica [pregabalin] Other (See Comments)   Edema in feet   Statins Other (See Comments)   Joints ache   Sulfa Antibiotics Nausea And Vomiting        Medication List     TAKE these medications    ALPRAZolam 0.5 MG tablet Commonly known as: XANAX Take 1-2 tablets (0.5-1 mg total) by mouth daily as needed for anxiety. Take 1-2 tabs daily as needed What changed:  how much to take when to take this additional  instructions   ARIPiprazole 2 MG tablet Commonly known as: ABILIFY TAKE 1 TABLET DAILY What changed:  how much to take when to take this   Aspercreme Lidocaine 4 % Liqd Generic drug: Lidocaine HCl Apply 1 spray topically at bedtime as needed (foot pain).   aspirin EC 325 MG tablet Take 1 tablet (325 mg total) by mouth daily for 20 days. Then resume one 81 mg ASA. What changed:  medication strength how much to take additional instructions   atorvastatin 10 MG tablet Commonly known as: LIPITOR Take 1 tablet (10 mg total) by mouth daily. Needs office visit for further refills What changed:  when to take this additional instructions   [place] Tea Tree 100 %  Oil Apply 1 application. topically at bedtime as needed (foot pain).   blood glucose meter kit and supplies Kit Dispense based on patient and insurance preference. Use up to two times daily as directed. (Dx: type 2 DM - E11.9)   clopidogrel 75 MG tablet Commonly known as: PLAVIX Take 1 tablet (75 mg total) by mouth daily.   docusate sodium 100 MG capsule Commonly known as: COLACE Take 1 capsule (100 mg total) by mouth every 12 (twelve) hours. What changed: when to take this   escitalopram 20 MG tablet Commonly known as: LEXAPRO Take 1 tablet (20 mg total) by mouth daily. Needs office visit for further refills What changed: when to take this   ezetimibe 10 MG tablet Commonly known as: ZETIA Take 1 tablet (10 mg total) by mouth daily. What changed: when to take this   glipiZIDE 5 MG 24 hr tablet Commonly known as: GLUCOTROL XL TAKE 1 TABLET DAILY WITH BREAKFAST What changed: how to take this   HYDROcodone-acetaminophen 5-325 MG tablet Commonly known as: NORCO/VICODIN Take 1 tablet by mouth daily as needed (pain).   meclizine 25 MG tablet Commonly known as: ANTIVERT TAKE ONE TABLET THREE TIMES DAILY AS NEEDED FOR DIZZINESS What changed: See the new instructions.   methocarbamol 500 MG tablet Commonly  known as: ROBAXIN Take 1 tablet (500 mg total) by mouth every 6 (six) hours as needed for muscle spasms. What changed: when to take this   nicotine polacrilex 2 MG gum Commonly known as: NICORETTE Take 2 mg by mouth every 2 (two) hours.   ONE TOUCH ULTRA 2 w/Device Kit CHECK BLOOD SUGAR UP TO TWICE DAILY Dx Y85.0   OneTouch Delica Lancets 27X Misc CHECK BLOOD SUGAR UP TO TWICE DAILY Dx E11.9   OneTouch Ultra test strip Generic drug: glucose blood Check BS up to 2 times daily Dx 11.43   Ozempic (1 MG/DOSE) 4 MG/3ML Sopn Generic drug: Semaglutide (1 MG/DOSE) Inject 1 mg into the skin once a week. (NEEDS TO BE SEEN BEFORE NEXT REFILL) What changed: when to take this   pantoprazole 40 MG tablet Commonly known as: PROTONIX Take 1 tablet (40 mg total) by mouth daily. What changed: when to take this   polyethylene glycol 17 g packet Commonly known as: MIRALAX / GLYCOLAX Take 17 g by mouth 2 (two) times daily as needed (constipation).   Premarin vaginal cream Generic drug: conjugated estrogens Place vaginally at bedtime. What changed:  how much to take when to take this reasons to take this   Synjardy XR 25-1000 MG Tb24 Generic drug: Empagliflozin-metFORMIN HCl ER TAKE 1 TABLET DAILY What changed: when to take this   Vitamin D 50 MCG (2000 UT) tablet Take 2,000 Units by mouth every morning.       ASK your doctor about these medications    gabapentin 600 MG tablet Commonly known as: NEURONTIN TAKE 1 TABLET EVERY MORNING, NOON EVENING & AT BEDTIME Ask about: Which instructions should I use?   lisinopril-hydrochlorothiazide 20-25 MG tablet Commonly known as: ZESTORETIC TAKE 1 TABLET DAILY Ask about: Which instructions should I use?               Discharge Care Instructions  (From admission, onward)           Start     Ordered   09/11/21 0000  Weight bearing as tolerated        09/11/21 0727   09/11/21 0000  Change dressing  Comments: You may  remove the bulky bandage (ACE wrap and gauze) two days after surgery. You will have an adhesive waterproof bandage underneath. Leave this in place until your first follow-up appointment.   09/11/21 4001            Follow-up Information     Gaynelle Arabian, MD. Schedule an appointment as soon as possible for a visit in 2 week(s).   Specialty: Orthopedic Surgery Contact information: [address] [place] [address] [phone]                 Signed: R. Jaynie Bream, PA-C Orthopedic Surgery 09/12/2021, 6:36 PM

## 2021-09-13 ENCOUNTER — Other Ambulatory Visit: Payer: Self-pay | Admitting: Family Medicine

## 2021-09-13 DIAGNOSIS — E11610 Type 2 diabetes mellitus with diabetic neuropathic arthropathy: Secondary | ICD-10-CM

## 2021-09-15 ENCOUNTER — Other Ambulatory Visit: Payer: Self-pay | Admitting: Family Medicine

## 2021-09-15 DIAGNOSIS — E11610 Type 2 diabetes mellitus with diabetic neuropathic arthropathy: Secondary | ICD-10-CM

## 2021-09-15 DIAGNOSIS — E1143 Type 2 diabetes mellitus with diabetic autonomic (poly)neuropathy: Secondary | ICD-10-CM

## 2021-09-16 ENCOUNTER — Other Ambulatory Visit: Payer: Self-pay | Admitting: Family Medicine

## 2021-09-16 DIAGNOSIS — E11610 Type 2 diabetes mellitus with diabetic neuropathic arthropathy: Secondary | ICD-10-CM

## 2021-09-17 ENCOUNTER — Other Ambulatory Visit: Payer: Self-pay | Admitting: Family Medicine

## 2021-09-17 DIAGNOSIS — E1142 Type 2 diabetes mellitus with diabetic polyneuropathy: Secondary | ICD-10-CM

## 2021-09-17 DIAGNOSIS — F320 Major depressive disorder, single episode, mild: Secondary | ICD-10-CM

## 2021-09-23 DIAGNOSIS — M5136 Other intervertebral disc degeneration, lumbar region: Secondary | ICD-10-CM | POA: Diagnosis not present

## 2021-09-23 DIAGNOSIS — Z1231 Encounter for screening mammogram for malignant neoplasm of breast: Secondary | ICD-10-CM | POA: Diagnosis not present

## 2021-09-23 DIAGNOSIS — G8929 Other chronic pain: Secondary | ICD-10-CM | POA: Diagnosis not present

## 2021-09-23 DIAGNOSIS — M48062 Spinal stenosis, lumbar region with neurogenic claudication: Secondary | ICD-10-CM | POA: Diagnosis not present

## 2021-09-23 DIAGNOSIS — Z79891 Long term (current) use of opiate analgesic: Secondary | ICD-10-CM | POA: Diagnosis not present

## 2021-09-24 ENCOUNTER — Encounter: Payer: Self-pay | Admitting: Family Medicine

## 2021-09-24 ENCOUNTER — Ambulatory Visit (INDEPENDENT_AMBULATORY_CARE_PROVIDER_SITE_OTHER): Payer: Medicare Other | Admitting: Family Medicine

## 2021-09-24 DIAGNOSIS — E1143 Type 2 diabetes mellitus with diabetic autonomic (poly)neuropathy: Secondary | ICD-10-CM

## 2021-09-24 DIAGNOSIS — Z79899 Other long term (current) drug therapy: Secondary | ICD-10-CM

## 2021-09-24 DIAGNOSIS — F320 Major depressive disorder, single episode, mild: Secondary | ICD-10-CM | POA: Diagnosis not present

## 2021-09-24 DIAGNOSIS — Z780 Asymptomatic menopausal state: Secondary | ICD-10-CM | POA: Diagnosis not present

## 2021-09-24 DIAGNOSIS — E1142 Type 2 diabetes mellitus with diabetic polyneuropathy: Secondary | ICD-10-CM | POA: Diagnosis not present

## 2021-09-24 DIAGNOSIS — E11610 Type 2 diabetes mellitus with diabetic neuropathic arthropathy: Secondary | ICD-10-CM

## 2021-09-24 DIAGNOSIS — I10 Essential (primary) hypertension: Secondary | ICD-10-CM

## 2021-09-24 DIAGNOSIS — R42 Dizziness and giddiness: Secondary | ICD-10-CM

## 2021-09-24 MED ORDER — PREMARIN 0.625 MG/GM VA CREA: TOPICAL_CREAM | Freq: Every day | VAGINAL | 3 refills | Status: DC

## 2021-09-24 MED ORDER — ALPRAZOLAM 0.5 MG PO TABS: 0.5000 mg | ORAL_TABLET | Freq: Every day | ORAL | 2 refills | Status: DC | PRN

## 2021-09-24 MED ORDER — MECLIZINE HCL 25 MG PO TABS: ORAL_TABLET | ORAL | 1 refills | Status: DC

## 2021-09-24 MED ORDER — CLOPIDOGREL BISULFATE 75 MG PO TABS
75.0000 mg | ORAL_TABLET | Freq: Every day | ORAL | 3 refills | Status: DC
Start: 2021-09-24 — End: 2022-08-03

## 2021-09-24 MED ORDER — EZETIMIBE 10 MG PO TABS: 10.0000 mg | ORAL_TABLET | Freq: Every day | ORAL | 3 refills | Status: DC

## 2021-09-24 MED ORDER — OZEMPIC (1 MG/DOSE) 4 MG/3ML ~~LOC~~ SOPN: PEN_INJECTOR | SUBCUTANEOUS | 3 refills | Status: DC

## 2021-09-24 MED ORDER — LISINOPRIL-HYDROCHLOROTHIAZIDE 20-25 MG PO TABS: 1.0000 | ORAL_TABLET | Freq: Every day | ORAL | 3 refills | Status: DC

## 2021-09-24 MED ORDER — PANTOPRAZOLE SODIUM 40 MG PO TBEC
40.0000 mg | DELAYED_RELEASE_TABLET | Freq: Every day | ORAL | 3 refills | Status: DC
Start: 2021-09-24 — End: 2022-10-01

## 2021-09-24 MED ORDER — ATORVASTATIN CALCIUM 10 MG PO TABS: 10.0000 mg | ORAL_TABLET | Freq: Every day | ORAL | 3 refills | Status: DC

## 2021-09-24 MED ORDER — ONETOUCH ULTRA VI STRP: ORAL_STRIP | 3 refills | Status: DC

## 2021-09-24 MED ORDER — SYNJARDY XR 25-1000 MG PO TB24
1.0000 | ORAL_TABLET | Freq: Every day | ORAL | 3 refills | Status: DC
Start: 2021-09-24 — End: 2022-06-16

## 2021-09-24 MED ORDER — ESCITALOPRAM OXALATE 20 MG PO TABS: 20.0000 mg | ORAL_TABLET | Freq: Every day | ORAL | 3 refills | Status: DC

## 2021-09-24 MED ORDER — ONETOUCH DELICA LANCETS 33G MISC: 3 refills | Status: DC

## 2021-09-24 MED ORDER — GLIPIZIDE ER 5 MG PO TB24: 5.0000 mg | ORAL_TABLET | Freq: Every day | ORAL | 3 refills | Status: DC

## 2021-09-24 MED ORDER — GABAPENTIN 600 MG PO TABS: ORAL_TABLET | ORAL | 3 refills | Status: DC

## 2021-09-24 NOTE — Addendum Note (Signed)
Addended by: Caryl Pina on: 09/24/2021 04:30 PM   Modules accepted: Orders

## 2021-09-24 NOTE — Addendum Note (Signed)
Addended by: Alphonzo Dublin on: 09/24/2021 04:15 PM   Modules accepted: Orders

## 2021-09-24 NOTE — Progress Notes (Signed)
BP 101/63   Pulse 87   Temp 98 F (36.7 C)   Ht 5' 6"  (1.676 m)   Wt 203 lb (92.1 kg)   SpO2 96%   BMI 32.77 kg/m    Subjective:   [name], female    DOB: March 09, 1946, 76 y.o.   MRN: [id number]  HPI: [name] is a 76 y.o. female presenting on 09/24/2021 for Medical Management of Chronic Issues, Diabetes, and Anxiety   HPI Anxiety depression recheck Current rx-alprazolam 0.5 mg 1 to 2 tablets daily as needed # meds rx-30 Effectiveness of current meds-works well except for sometimes she still cannot sleep recently.  She says she does wake up to urinate. Adverse reactions form meds-none  Pill count performed-No Last drug screen -01/15/2021 ( high risk q86m moderate risk q685mlow risk yearly ) Urine drug screen today- No [place]  If yes were their any concerning findings? -Patient also gets the occasional hydrocodone but does not take consistently, has been warned not to use together.  No flowsheet data found.   Controlled substance contract signed on: 01/15/2021  Hypertension Patient is currently on lisinopril hydrochlorothiazide, and their blood pressure today is 101/63. Patient denies any lightheadedness or dizziness. Patient denies headaches, blurred vision, chest pains, shortness of breath, or weakness. Denies any side effects from medication and is content with current medication.   Type 2 diabetes mellitus Patient comes in today for recheck of his diabetes. Patient has been currently taking glipizide and Ozempic and Synjardy. Patient is currently on an ACE inhibitor/ARB. Patient has not seen an ophthalmologist this year. Patient denies any issues with their feet. The symptom started onset as an adult hypertension and neuropathy ARE RELATED TO DM     Relevant past medical, surgical, family and social history reviewed and updated as indicated. Interim medical history since our last visit reviewed. Allergies and medications  reviewed and updated.  Review of Systems  Constitutional:  Negative for chills and fever.  Eyes:  Negative for visual disturbance.  Respiratory:  Negative for chest tightness and shortness of breath.   Cardiovascular:  Negative for chest pain and leg swelling.  Musculoskeletal:  Positive for arthralgias. Negative for back pain, gait problem and myalgias.  Skin:  Negative for rash.  Neurological:  Negative for light-headedness and headaches.  Psychiatric/Behavioral:  Positive for dysphoric mood and sleep disturbance. Negative for agitation, behavioral problems, self-injury and suicidal ideas. The patient is nervous/anxious.   All other systems reviewed and are negative.   Per HPI unless specifically indicated above   Allergies as of 09/24/2021       Reactions   Penicillins Other (See Comments)   Blisters in mouth Did it involve swelling of the face/tongue/throat, SOB, or low BP? No Did it involve sudden or severe rash/hives, skin peeling, or any reaction on the inside of your mouth or nose? No Did you need to seek medical attention at a hospital or doctor's office? No When did it last happen?      Childhood allergy If all above answers are "NO", may proceed with cephalosporin use.   Codeine Nausea And Vomiting   Erythromycin Nausea And Vomiting   Fenofibrate Other (See Comments)   aching & edema    Lyrica [pregabalin] Other (See Comments)   Edema in feet   Statins Other (See Comments)   Joints ache   Sulfa Antibiotics Nausea And Vomiting        Medication List  Accurate as of September 24, 2021  3:47 PM. If you have any questions, ask your nurse or doctor.          ALPRAZolam 0.5 MG tablet Commonly known as: XANAX Take 1-2 tablets (0.5-1 mg total) by mouth daily as needed for anxiety. Take 1-2 tabs daily as needed What changed:  how much to take when to take this reasons to take this additional instructions   ARIPiprazole 2 MG tablet Commonly known as:  ABILIFY TAKE 1 TABLET DAILY What changed:  how much to take when to take this   Aspercreme Lidocaine 4 % Liqd Generic drug: Lidocaine HCl Apply 1 spray topically at bedtime as needed (foot pain).   aspirin EC 325 MG tablet Take 1 tablet (325 mg total) by mouth daily for 20 days. Then resume one 81 mg ASA.   atorvastatin 10 MG tablet Commonly known as: LIPITOR Take 1 tablet (10 mg total) by mouth daily.   Australian Tea Tree 100 % Oil Apply 1 application. topically at bedtime as needed (foot pain).   blood glucose meter kit and supplies Kit Dispense based on patient and insurance preference. Use up to two times daily as directed. (Dx: type 2 DM - E11.9)   clopidogrel 75 MG tablet Commonly known as: PLAVIX Take 1 tablet (75 mg total) by mouth daily.   docusate sodium 100 MG capsule Commonly known as: COLACE Take 1 capsule (100 mg total) by mouth every 12 (twelve) hours. What changed: when to take this   escitalopram 20 MG tablet Commonly known as: LEXAPRO Take 1 tablet (20 mg total) by mouth daily.   ezetimibe 10 MG tablet Commonly known as: ZETIA Take 1 tablet (10 mg total) by mouth daily. What changed: when to take this   gabapentin 600 MG tablet Commonly known as: NEURONTIN TAKE 1 TABLET EVERY MORNING, NOON EVENING & AT BEDTIME   glipiZIDE 5 MG 24 hr tablet Commonly known as: GLUCOTROL XL Take 1 tablet (5 mg total) by mouth daily with breakfast. What changed: how to take this   HYDROcodone-acetaminophen 5-325 MG tablet Commonly known as: NORCO/VICODIN Take 1 tablet by mouth daily as needed (pain).   lisinopril-hydrochlorothiazide 20-25 MG tablet Commonly known as: ZESTORETIC Take 1 tablet by mouth daily.   meclizine 25 MG tablet Commonly known as: ANTIVERT TAKE ONE TABLET THREE TIMES DAILY AS NEEDED FOR DIZZINESS Strength: 25 mg What changed: See the new instructions. Changed by: Fransisca Kaufmann Breyonna Nault, MD   methocarbamol 500 MG tablet Commonly known as:  ROBAXIN Take 1 tablet (500 mg total) by mouth every 6 (six) hours as needed for muscle spasms.   nicotine polacrilex 2 MG gum Commonly known as: NICORETTE Take 2 mg by mouth every 2 (two) hours.   ONE TOUCH ULTRA 2 w/Device Kit CHECK BLOOD SUGAR UP TO TWICE DAILY Dx G01.7   OneTouch Delica Lancets 49S Misc CHECK BLOOD SUGAR UP TO TWICE DAILY Dx E11.9   OneTouch Ultra test strip Generic drug: glucose blood Check BS up to 2 times daily Dx 11.43   Ozempic (1 MG/DOSE) 4 MG/3ML Sopn Generic drug: Semaglutide (1 MG/DOSE) INJECT 1MG ONCE A WEEK   pantoprazole 40 MG tablet Commonly known as: PROTONIX Take 1 tablet (40 mg total) by mouth daily. What changed: when to take this   polyethylene glycol 17 g packet Commonly known as: MIRALAX / GLYCOLAX Take 17 g by mouth 2 (two) times daily as needed (constipation).   Premarin vaginal cream Generic drug: conjugated estrogens  Place vaginally at bedtime. What changed:  how much to take when to take this reasons to take this   Synjardy XR 25-1000 MG Tb24 Generic drug: Empagliflozin-metFORMIN HCl ER Take 1 tablet by mouth daily. What changed: when to take this   Vitamin D 50 MCG (2000 UT) tablet Take 2,000 Units by mouth every morning.         Objective:   BP 101/63   Pulse 87   Temp 98 F (36.7 C)   Ht 5' 6"  (1.676 m)   Wt 203 lb (92.1 kg)   SpO2 96%   BMI 32.77 kg/m   Wt Readings from Last 3 Encounters:  09/24/21 203 lb (92.1 kg)  09/10/21 202 lb (91.6 kg)  09/05/21 202 lb (91.6 kg)    Physical Exam Vitals and nursing note reviewed.  Constitutional:      General: She is not in acute distress.    Appearance: She is well-developed. She is not diaphoretic.  Eyes:     Conjunctiva/sclera: Conjunctivae normal.  Cardiovascular:     Rate and Rhythm: Normal rate and regular rhythm.     Heart sounds: Normal heart sounds. No murmur heard. Pulmonary:     Effort: Pulmonary effort is normal. No respiratory distress.      Breath sounds: Normal breath sounds. No wheezing.  Musculoskeletal:        General: No swelling or tenderness. Normal range of motion.  Skin:    General: Skin is warm and dry.     Findings: No rash.  Neurological:     Mental Status: She is alert and oriented to person, place, and time.     Coordination: Coordination normal.  Psychiatric:        Behavior: Behavior normal.     Results for orders placed or performed during the hospital encounter of 09/10/21  Glucose, capillary  Result Value Ref Range   Glucose-Capillary 155 (H) 70 - 99 mg/dL   Comment 1 Notify RN    Comment 2 Document in Chart   Glucose, capillary  Result Value Ref Range   Glucose-Capillary 119 (H) 70 - 99 mg/dL  CBC  Result Value Ref Range   WBC 9.9 4.0 - 10.5 K/uL   RBC 4.44 3.87 - 5.11 MIL/uL   Hemoglobin 10.0 (L) 12.0 - 15.0 g/dL   HCT 34.3 (L) 36.0 - 46.0 %   MCV 77.3 (L) 80.0 - 100.0 fL   MCH 22.5 (L) 26.0 - 34.0 pg   MCHC 29.2 (L) 30.0 - 36.0 g/dL   RDW 18.1 (H) 11.5 - 15.5 %   Platelets 242 150 - 400 K/uL   nRBC 0.0 0.0 - 0.2 %  Basic metabolic panel  Result Value Ref Range   Sodium 135 135 - 145 mmol/L   Potassium 4.5 3.5 - 5.1 mmol/L   Chloride 103 98 - 111 mmol/L   CO2 22 22 - 32 mmol/L   Glucose, Bld 343 (H) 70 - 99 mg/dL   BUN 19 8 - 23 mg/dL   Creatinine, Ser 0.89 0.44 - 1.00 mg/dL   Calcium 8.7 (L) 8.9 - 10.3 mg/dL   GFR, Estimated >60 >60 mL/min   Anion gap 10 5 - 15  Glucose, capillary  Result Value Ref Range   Glucose-Capillary 279 (H) 70 - 99 mg/dL  Glucose, capillary  Result Value Ref Range   Glucose-Capillary 332 (H) 70 - 99 mg/dL    Assessment & Plan:   Problem List Items Addressed This Visit  Cardiovascular and Mediastinum   Essential hypertension, benign (Chronic)   Relevant Medications   atorvastatin (LIPITOR) 10 MG tablet   ezetimibe (ZETIA) 10 MG tablet   lisinopril-hydrochlorothiazide (ZESTORETIC) 20-25 MG tablet     Endocrine   Diabetes (HCC)  (Chronic)   Relevant Medications   atorvastatin (LIPITOR) 10 MG tablet   ezetimibe (ZETIA) 10 MG tablet   glipiZIDE (GLUCOTROL XL) 5 MG 24 hr tablet   lisinopril-hydrochlorothiazide (ZESTORETIC) 20-25 MG tablet   Semaglutide, 1 MG/DOSE, (OZEMPIC, 1 MG/DOSE,) 4 MG/3ML SOPN   Empagliflozin-metFORMIN HCl ER (SYNJARDY XR) 25-1000 MG TB24   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HCC)   Relevant Medications   ALPRAZolam (XANAX) 0.5 MG tablet   atorvastatin (LIPITOR) 10 MG tablet   escitalopram (LEXAPRO) 20 MG tablet   ezetimibe (ZETIA) 10 MG tablet   gabapentin (NEURONTIN) 600 MG tablet   glipiZIDE (GLUCOTROL XL) 5 MG 24 hr tablet   lisinopril-hydrochlorothiazide (ZESTORETIC) 20-25 MG tablet   OneTouch Delica Lancets 99Y MISC   Semaglutide, 1 MG/DOSE, (OZEMPIC, 1 MG/DOSE,) 4 MG/3ML SOPN   Empagliflozin-metFORMIN HCl ER (SYNJARDY XR) 25-1000 MG TB24   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)   Relevant Medications   atorvastatin (LIPITOR) 10 MG tablet   glipiZIDE (GLUCOTROL XL) 5 MG 24 hr tablet   lisinopril-hydrochlorothiazide (ZESTORETIC) 20-25 MG tablet   OneTouch Delica Lancets 72J MISC   Semaglutide, 1 MG/DOSE, (OZEMPIC, 1 MG/DOSE,) 4 MG/3ML SOPN   Empagliflozin-metFORMIN HCl ER (SYNJARDY XR) 25-1000 MG TB24     Other   Depression   Relevant Medications   ALPRAZolam (XANAX) 0.5 MG tablet   escitalopram (LEXAPRO) 20 MG tablet   Other Visit Diagnoses     Controlled substance agreement signed       Relevant Medications   ALPRAZolam (XANAX) 0.5 MG tablet   Postmenopausal       Relevant Medications   conjugated estrogens (PREMARIN) vaginal cream   Vertigo       Relevant Medications   meclizine (ANTIVERT) 25 MG tablet       Continue current medicine, no changes.  Patient says she is not smoking anymore doing nicotine patches. Follow up plan: Return in about 3 months (around 12/25/2021), or if symptoms worsen or fail to improve, for Diabetes and  hypertension.  Counseling provided for all of the vaccine components No orders of the defined types were placed in this encounter.   Caryl Pina, MD [facility] 09/24/2021, 3:47 PM

## 2021-10-09 ENCOUNTER — Other Ambulatory Visit: Payer: Self-pay | Admitting: Family Medicine

## 2021-10-09 DIAGNOSIS — E11610 Type 2 diabetes mellitus with diabetic neuropathic arthropathy: Secondary | ICD-10-CM

## 2021-10-09 DIAGNOSIS — E1143 Type 2 diabetes mellitus with diabetic autonomic (poly)neuropathy: Secondary | ICD-10-CM

## 2021-10-15 ENCOUNTER — Encounter: Payer: Self-pay | Admitting: Acute Care

## 2021-10-15 ENCOUNTER — Ambulatory Visit (INDEPENDENT_AMBULATORY_CARE_PROVIDER_SITE_OTHER): Payer: Medicare Other | Admitting: Acute Care

## 2021-10-15 ENCOUNTER — Ambulatory Visit: Payer: Medicare Other | Admitting: Acute Care

## 2021-10-15 DIAGNOSIS — Z87891 Personal history of nicotine dependence: Secondary | ICD-10-CM | POA: Diagnosis not present

## 2021-10-15 NOTE — Progress Notes (Signed)
Virtual Visit via Telephone Note  I connected with [name] on 10/15/21 at 10:30 AM EDT by telephone and verified that I am speaking with the correct person using two identifiers.  Location: Patient:  At home Provider:  [place], [place], [place], [address]    I discussed the limitations, risks, security and privacy concerns of performing an evaluation and management service by telephone and the availability of in person appointments. I also discussed with the patient that there may be a patient responsible charge related to this service. The patient expressed understanding and agreed to proceed.    Shared Decision Making Visit Lung Cancer Screening Program [phone])   Eligibility: Age 76 y.o. Pack Years Smoking History Calculation 40 pack year smoking history (# packs/per year x # years smoked) Recent History of coughing up blood  no Unexplained weight loss? no ( >Than 15 pounds within the last 6 months ) Prior History Lung / other cancer no (Diagnosis within the last 5 years already requiring surveillance chest CT Scans). Smoking Status Former Smoker Former Smokers: Years since quit: 11 years  Quit Date: 06/24/2010  Visit Components: Discussion included one or more decision making aids. yes Discussion included risk/benefits of screening. yes Discussion included potential follow up diagnostic testing for abnormal scans. yes Discussion included meaning and risk of over diagnosis. yes Discussion included meaning and risk of False Positives. yes Discussion included meaning of total radiation exposure. yes  Counseling Included: Importance of adherence to annual lung cancer LDCT screening. yes Impact of comorbidities on ability to participate in the program. yes Ability and willingness to under diagnostic treatment. yes  Smoking Cessation Counseling: Current Smokers:  Discussed importance of smoking cessation. yes Information about tobacco cessation classes and  interventions provided to patient. yes Patient provided with "ticket" for LDCT Scan. yes Symptomatic Patient. no  Counseling NA Diagnosis Code: Tobacco Use Z72.0 Asymptomatic Patient yes  Counseling (Intermediate counseling: > three minutes counseling) [id number] Former Smokers:  Discussed the importance of maintaining cigarette abstinence. yes Diagnosis Code: Personal History of Nicotine Dependence. G18.299 Information about tobacco cessation classes and interventions provided to patient. Yes Patient provided with "ticket" for LDCT Scan. yes Written Order for Lung Cancer Screening with LDCT placed in Epic. Yes (CT Chest Lung Cancer Screening Low Dose W/O CM) [id number] Z12.2-Screening of respiratory organs Z87.891-Personal history of nicotine dependence  I spent 25 minutes of face to face time/virtual visit time  with  [name] discussing the risks and benefits of lung cancer screening. We took the time to pause the power point at intervals to allow for questions to be asked and answered to ensure understanding. We discussed that she had taken the single most powerful action possible to decrease her risk of developing lung cancer when she quit smoking. I counseled her to remain smoke free, and to contact me if she ever had the desire to smoke again so that I can provide resources and tools to help support the effort to remain smoke free. We discussed the time and location of the scan, and that either  Doroteo Glassman RN, Joella Prince, RN or I  or I will call / send a letter with the results within  24-72 hours of receiving them. She has the office contact information in the event she needs to speak with me,  she verbalized understanding of all of the above and had no further questions upon leaving the office.     I explained to the patient that there  has been a high incidence of coronary artery disease noted on these exams. I explained that this is a non-gated exam therefore degree or severity cannot  be determined. This patient is on statin therapy. I have asked the patient to follow-up with their PCP regarding any incidental finding of coronary artery disease and management with diet or medication as they feel is clinically indicated. The patient verbalized understanding of the above and had no further questions.     Magdalen Spatz, NP 10/15/2021

## 2021-10-15 NOTE — Patient Instructions (Signed)
Thank you for participating in the [facility] Cancer Screening Program. It was our pleasure to meet you today. We will call you with the results of your scan within the next few days. Your scan will be assigned a Lung RADS category score by the physicians reading the scans.  This Lung RADS score determines follow up scanning.  See below for description of categories, and follow up screening recommendations. We will be in touch to schedule your follow up screening annually or based on recommendations of our providers. We will fax a copy of your scan results to your Primary Care Physician, or the physician who referred you to the program, to ensure they have the results. Please call the office if you have any questions or concerns regarding your scanning experience or results.  Our office number is [phone]. Please speak with Denise Phelps, RN. , or  Denise Buckner RN, They are  our Lung Cancer Screening RN.'s If They are unavailable when you call, Please leave a message on the voice mail. We will return your call at our earliest convenience.This voice mail is monitored several times a day.  Remember, if your scan is normal, we will scan you annually as long as you continue to meet the criteria for the program. (Age 55-77, Current smoker or smoker who has quit within the last 15 years). If you are a smoker, remember, quitting is the single most powerful action that you can take to decrease your risk of lung cancer and other pulmonary, breathing related problems. We know quitting is hard, and we are here to help.  Please let us know if there is anything we can do to help you meet your goal of quitting. If you are a former smoker, congratulations. We are proud of you! Remain smoke free! Remember you can refer friends or family members through the number above.  We will screen them to make sure they meet criteria for the program. Thank you for helping us take better care of you by  participating in Lung Screening.  You can receive free nicotine replacement therapy ( patches, gum or mints) by calling 1-800-QUIT NOW. Please call so we can get you on the path to becoming  a non-smoker. I know it is hard, but you can do this!  Lung RADS Categories:  Lung RADS 1: no nodules or definitely non-concerning nodules.  Recommendation is for a repeat annual scan in 12 months.  Lung RADS 2:  nodules that are non-concerning in appearance and behavior with a very low likelihood of becoming an active cancer. Recommendation is for a repeat annual scan in 12 months.  Lung RADS 3: nodules that are probably non-concerning , includes nodules with a low likelihood of becoming an active cancer.  Recommendation is for a 6-month repeat screening scan. Often noted after an upper respiratory illness. We will be in touch to make sure you have no questions, and to schedule your 6-month scan.  Lung RADS 4 A: nodules with concerning findings, recommendation is most often for a follow up scan in 3 months or additional testing based on our provider's assessment of the scan. We will be in touch to make sure you have no questions and to schedule the recommended 3 month follow up scan.  Lung RADS 4 B:  indicates findings that are concerning. We will be in touch with you to schedule additional diagnostic testing based on our provider's  assessment of the scan.  Other options for assistance in smoking cessation (   As covered by your insurance benefits)  Hypnosis for smoking cessation  [employer] [phone]  Acupuncture for smoking cessation  [facility] [phone]   

## 2021-10-21 ENCOUNTER — Ambulatory Visit (HOSPITAL_COMMUNITY)
Admission: RE | Admit: 2021-10-21 | Discharge: 2021-10-21 | Disposition: A | Discharge status: Home / Self Care | Payer: Medicare Other | Source: Ambulatory Visit | Attending: Internal Medicine | Admitting: Internal Medicine

## 2021-10-21 DIAGNOSIS — Z87891 Personal history of nicotine dependence: Secondary | ICD-10-CM | POA: Diagnosis not present

## 2021-10-21 DIAGNOSIS — Z122 Encounter for screening for malignant neoplasm of respiratory organs: Secondary | ICD-10-CM | POA: Diagnosis not present

## 2021-10-23 ENCOUNTER — Other Ambulatory Visit: Payer: Self-pay

## 2021-10-23 DIAGNOSIS — Z122 Encounter for screening for malignant neoplasm of respiratory organs: Secondary | ICD-10-CM

## 2021-10-23 DIAGNOSIS — Z87891 Personal history of nicotine dependence: Secondary | ICD-10-CM

## 2021-10-28 ENCOUNTER — Other Ambulatory Visit: Payer: Self-pay

## 2021-10-28 ENCOUNTER — Encounter: Payer: Self-pay | Admitting: Physical Therapy

## 2021-10-28 ENCOUNTER — Ambulatory Visit: Payer: Medicare Other | Attending: Orthopedic Surgery | Admitting: Physical Therapy

## 2021-10-28 DIAGNOSIS — M6281 Muscle weakness (generalized): Secondary | ICD-10-CM | POA: Insufficient documentation

## 2021-10-28 DIAGNOSIS — M25661 Stiffness of right knee, not elsewhere classified: Secondary | ICD-10-CM | POA: Diagnosis not present

## 2021-10-28 DIAGNOSIS — G8929 Other chronic pain: Secondary | ICD-10-CM | POA: Insufficient documentation

## 2021-10-28 DIAGNOSIS — M25561 Pain in right knee: Secondary | ICD-10-CM | POA: Insufficient documentation

## 2021-10-28 DIAGNOSIS — R600 Localized edema: Secondary | ICD-10-CM | POA: Insufficient documentation

## 2021-10-28 NOTE — Therapy (Signed)
OUTPATIENT PHYSICAL THERAPY LOWER EXTREMITY EVALUATION   Patient Name: [name] MRN: [id number] DOB:November 20, 1945, 76 y.o., female Today's Date: 10/28/2021   PT End of Session - 10/28/21 1200     Visit Number 1    Number of Visits 8    Date for PT Re-Evaluation 11/25/21    PT Start Time 0951    PT Stop Time 1040    PT Time Calculation (min) 49 min    Activity Tolerance Patient tolerated treatment well    Behavior During Therapy [facility] for tasks assessed/performed             Past Medical History:  Diagnosis Date   Allergy    seasonal   Anxiety    Arthritis    [place]    Colon polyps    Complication of anesthesia    per pt, she woke up in the middle of last colon in 2014.   Depression    Diabetes mellitus, type 2 ([place]) 06/2010   Diabetic neuropathy ([place])    Diverticulosis    pt unaware   Esophageal stricture    Fibromyalgia    [place]    GERD (gastroesophageal reflux disease)    Hemorrhoids    Hiatal hernia    Hyperlipidemia    Hypertension    Menopause    Numbness    Peripheral vascular insufficiency (HCC)    Retinopathy    Bilateral   TIA (transient ischemic attack)    Tremor    Vitamin D deficiency    Past Surgical History:  Procedure Laterality Date   ABDOMINAL HYSTERECTOMY     partial and then total   ANKLE SURGERY Right    BACK SURGERY     COLONOSCOPY     ELBOW SURGERY     left   KNEE ARTHROSCOPY Right 09/12/2018   Procedure: ARTHROSCOPY KNEE; SYNOVECTOMY;  Surgeon: Gaynelle Arabian, MD;  [facility];  Service: Orthopedics;  Laterality: Right;  51mn   PATELLA REVISION Right 09/10/2021   Procedure: Resection arthroplasty right patella;  Surgeon: AGaynelle Arabian MD;  [facility];  Service: Orthopedics;  Laterality: Right;   REFRACTIVE SURGERY  2003   SPINAL CORD STIMULATOR INSERTION N/A 01/01/2021   Procedure: SPINAL CORD STIMULATOR PLACEMENT;  Surgeon: BMelina Schools MD;  [place]  Service: Orthopedics;  Laterality: N/A;    TOTAL KNEE ARTHROPLASTY     bilateral   TOTAL KNEE REVISION Right 03/09/2018   Procedure: RIGHT TOTAL KNEE REVISION;  Surgeon: AGaynelle Arabian MD;  [facility];  Service: Orthopedics;  Laterality: Right;  1253m with abductor block   VESICOVAGINAL FISTULA CLOSURE W/ TAH  1981   Patient Active Problem List   Diagnosis Date Noted   Chronic pain 01/01/2021   Paresthesia 06/14/2019   Diabetic autonomic neuropathy associated with type 2 diabetes mellitus (HCJoseph[place]03/01/2020   Failed total knee arthroplasty (HCKill[place]12/07/2017   Aortic atherosclerosis (HCEd[place]09/07/2017   Hardening of the aorta (main artery of the heart) (HCLa[place]09/07/2017   History of total knee replacement, bilateral 11/04/2017   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HCWars[place]10/01/2015   Depression 02/28/2013   GERD (gastroesophageal reflux disease) 02/28/2013   Hiatal hernia with gastroesophageal reflux 02/28/2013   DDD (degenerative disc disease), lumbosacral 06/23/2012   Fibromyalgia syndrome 06/23/2012   Essential hypertension, benign 06/23/2012   Osteopenia 06/23/2012   Diabetes (HCLagunitas[facility]oll[facility]03/20/2014   Obesity due to excess calories 10/29/2010   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   Personal history of colonic polyps 10/11/2009  REFERRING PROVIDER: Gaynelle Arabian MD  REFERRING DIAG: Aftercare following joint replacement surgery.  THERAPY DIAG:  Chronic pain of right knee  Stiffness of right knee, not elsewhere classified  Localized edema  Muscle weakness (generalized)  Rationale for Evaluation and Treatment Rehabilitation  ONSET DATE: 09/10/21 (surgery date).  SUBJECTIVE:   SUBJECTIVE STATEMENT: The patient presents to the clinic today s/p right knee patellar resection performed on 09/10/21.  Her pain at rest is a 6/10 today with going from sit to stand and prolonged walking producing very high pain-levels.  Resting in her recliner decreases her pain.  PERTINENT HISTORY: Multiple right knee surgeries.   Lumbar stimulator.  PAIN:  Are you having pain? 6/10, right knee, ache, sore, throb.  PRECAUTIONS: Other: No ultrasound.  WEIGHT BEARING RESTRICTIONS No  FALLS:  Has patient fallen in last 6 months? No  LIVING ENVIRONMENT: Lives with: lives with their spouse Lives in: House/apartment Has following equipment at home: None  OCCUPATION: Retired.  PLOF: Independent with basic ADLs  PATIENT GOALS Decrease pain and be able to do more.   OBJECTIVE:   EDEMA:  Circumferential: Right 2 cms > left.  PALPATION: C/o diffuse anterior knee pain around periphery of patella.  LOWER EXTREMITY ROM:  Right knee AROM:  -20 to 130 degrees.  LOWER EXTREMITY MMT:                      Notable right quadriceps atrophy and decreased volitional contraction per contralateral comparison.    GAIT: Antalgic with decreased stance time over right LE.    TODAY'S TREATMENT: VMS to patient's right quadriceps x 20 minutes for neuro re-education with 10 sec extension holds f/b a 10 sec rest. ASSESSMENT:  CLINICAL IMPRESSION: The patient presents to the clinic today s/p right patellar resection performed on 09/10/21.  She continues to report a great deal of pain especially when transitioning from sit to stand and prolonged walking.  She is lacking some extension but her flexion is excellent.  She has some mild edema.  She has decreased volitional activation of her right quadriceps per contralateral comparison.  She c/o diffuse right anterior knee pain.  Patient will benefit from skilled physical therapy intervention to address pain and deficits.  OBJECTIVE IMPAIRMENTS Abnormal gait, decreased activity tolerance, difficulty walking, decreased ROM, decreased strength, increased edema, and pain.   ACTIVITY LIMITATIONS standing, stairs, and locomotion level  PARTICIPATION LIMITATIONS: meal prep, cleaning, laundry, and yard work  PERSONAL FACTORS 1 comorbidity: multiple right knee surgeries.  are also  affecting patient's functional outcome.   REHAB POTENTIAL: Fair    CLINICAL DECISION MAKING: Stable/uncomplicated  EVALUATION COMPLEXITY: Low   GOALS:  LONG TERM GOALS: Target date: 11/25/2021   Independent with an HEP. Baseline:  Goal status: INITIAL  2.  Sit to stand x 5 minutes with right knee pain not > 4/10. Baseline:  Goal status: INITIAL  3.  Walk a community distance with right knee pain not > 4/10. Baseline:  Goal status: INITIAL   PLAN: PT FREQUENCY: 2x/week  PT DURATION: 4 weeks  PLANNED INTERVENTIONS: Therapeutic exercises, Therapeutic activity, Neuromuscular re-education, Balance training, Gait training, Patient/Family education, Electrical stimulation, Moist heat, Vasopneumatic device, and Manual therapy  PLAN FOR NEXT SESSION: 4 electrode co-contract VMS to patient's right quadriceps, no pain increase right LE there ex (O and CKC).   Heyli Min, [place], PT 10/28/2021, 12:03 PM

## 2021-10-30 ENCOUNTER — Encounter: Payer: Self-pay | Admitting: *Deleted

## 2021-10-30 ENCOUNTER — Ambulatory Visit: Payer: Medicare Other | Admitting: *Deleted

## 2021-10-30 DIAGNOSIS — M25661 Stiffness of right knee, not elsewhere classified: Secondary | ICD-10-CM

## 2021-10-30 DIAGNOSIS — R600 Localized edema: Secondary | ICD-10-CM | POA: Diagnosis not present

## 2021-10-30 DIAGNOSIS — M6281 Muscle weakness (generalized): Secondary | ICD-10-CM | POA: Diagnosis not present

## 2021-10-30 DIAGNOSIS — G8929 Other chronic pain: Secondary | ICD-10-CM | POA: Diagnosis not present

## 2021-10-30 DIAGNOSIS — M25561 Pain in right knee: Secondary | ICD-10-CM | POA: Diagnosis not present

## 2021-10-30 NOTE — Therapy (Signed)
OUTPATIENT PHYSICAL THERAPY LOWER EXTREMITY TREATMENT   Patient Name: [name] MRN: [id number] DOB:[birth year], 76 y.o., female Today's Date: 10/30/2021   PT End of Session - 10/30/21 0954     Visit Number 2    Number of Visits 8    Date for PT Re-Evaluation 11/25/21    PT Start Time 0945             Past Medical History:  Diagnosis Date   Allergy    seasonal   Anxiety    Arthritis    [place]    Colon polyps    Complication of anesthesia    per pt, she woke up in the middle of last colon in 2014.   Depression    Diabetes mellitus, type 2 ([place]) 06/2010   Diabetic neuropathy ([place])    Diverticulosis    pt unaware   Esophageal stricture    Fibromyalgia    [place]    GERD (gastroesophageal reflux disease)    Hemorrhoids    Hiatal hernia    Hyperlipidemia    Hypertension    Menopause    Numbness    Peripheral vascular insufficiency (HCC)    Retinopathy    Bilateral   TIA (transient ischemic attack)    Tremor    Vitamin D deficiency    Past Surgical History:  Procedure Laterality Date   ABDOMINAL HYSTERECTOMY     partial and then total   ANKLE SURGERY Right    BACK SURGERY     COLONOSCOPY     ELBOW SURGERY     left   KNEE ARTHROSCOPY Right 09/12/2018   Procedure: ARTHROSCOPY KNEE; SYNOVECTOMY;  Surgeon: Gaynelle Arabian, MD;  [facility];  Service: Orthopedics;  Laterality: Right;  65mn   PATELLA REVISION Right 09/10/2021   Procedure: Resection arthroplasty right patella;  Surgeon: AGaynelle Arabian MD;  [facility];  Service: Orthopedics;  Laterality: Right;   REFRACTIVE SURGERY  2003   SPINAL CORD STIMULATOR INSERTION N/A 01/01/2021   Procedure: SPINAL CORD STIMULATOR PLACEMENT;  Surgeon: BMelina Schools MD;  [place]  Service: Orthopedics;  Laterality: N/A;   TOTAL KNEE ARTHROPLASTY     bilateral   TOTAL KNEE REVISION Right 03/09/2018   Procedure: RIGHT TOTAL KNEE REVISION;  Surgeon: AGaynelle Arabian MD;  [facility];   Service: Orthopedics;  Laterality: Right;  1257m with abductor block   VESICOVAGINAL FISTULA CLOSURE W/ TAH  1981   Patient Active Problem List   Diagnosis Date Noted   Chronic pain 01/01/2021   Paresthesia 06/14/2019   Diabetic autonomic neuropathy associated with type 2 diabetes mellitus (HCMur[place]03/01/2020   Failed total knee arthroplasty (HCMoskowite Corner12/07/2017   Aortic atherosclerosis (HCMart[place]09/07/2017   Hardening of the aorta (main artery of the heart) (HCUpland09/07/2017   History of total knee replacement, bilateral 11/04/2017   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HCTabor10/01/2015   Depression 02/28/2013   GERD (gastroesophageal reflux disease) 02/28/2013   Hiatal hernia with gastroesophageal reflux 02/28/2013   DDD (degenerative disc disease), lumbosacral 06/23/2012   Fibromyalgia syndrome 06/23/2012   Essential hypertension, benign 06/23/2012   Osteopenia 06/23/2012   Diabetes (HCAllent[place]03/20/2014   Obesity due to excess calories 10/29/2010   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   Personal history of colonic polyps 10/11/2009   REFERRING PROVIDER: FrGaynelle ArabianD  REFERRING DIAG: Aftercare following joint replacement surgery.  THERAPY DIAG:  Chronic pain of right knee  Stiffness of right knee, not elsewhere classified  Localized edema  Muscle weakness (generalized)  Rationale for Evaluation and Treatment Rehabilitation  ONSET DATE: 09/10/21 (surgery date).  SUBJECTIVE:   SUBJECTIVE STATEMENT: The patient presents to the clinic today s/p right knee patellar resection performed on 09/10/21.     PERTINENT HISTORY: Multiple right knee surgeries.  Lumbar stimulator.  PAIN:  Are you having pain? 6/10, right knee, ache, sore, throb.  PRECAUTIONS: Other: No ultrasound.    OBJECTIVE:   LOWER EXTREMITY ROM:  Right knee AROM:  -20 to 130 degrees.      TODAY'S TREATMENT:         10-30-21     [place] x 10 mins L4  VMS to patient's right quadriceps x 5 minutes  for neuro re-education with 10 sec extension holds f/b a 10 sec rest.  Then with SAQ x15    mins (last 3 mins with 2# wt) Vaso x 10 mins Edema       ASSESSMENT:  CLINICAL IMPRESSION: The patient presents to the clinic today s/p right patellar resection performed on 09/10/21. Her pain is 6/10. She reports doing okay after eval.  Rx focused on quad activation and  strengthening with [place] e-stim to facilitate quads. Pt did well with good mm contraction today and vaso end of session.       OBJECTIVE IMPAIRMENTS Abnormal gait, decreased activity tolerance, difficulty walking, decreased ROM, decreased strength, increased edema, and pain.   ACTIVITY LIMITATIONS standing, stairs, and locomotion level  PARTICIPATION LIMITATIONS: meal prep, cleaning, laundry, and yard work  PERSONAL FACTORS 1 comorbidity: multiple right knee surgeries.  are also affecting patient's functional outcome.   REHAB POTENTIAL: Fair    CLINICAL DECISION MAKING: Stable/uncomplicated  EVALUATION COMPLEXITY: Low   GOALS:  LONG TERM GOALS: Target date: 11/27/2021   Independent with an HEP. Baseline:  Goal status: INITIAL  2.  Sit to stand x 5 minutes with right knee pain not > 4/10. Baseline:  Goal status: INITIAL  3.  Walk a community distance with right knee pain not > 4/10. Baseline:  Goal status: INITIAL   PLAN: PT FREQUENCY: 2x/week  PT DURATION: 4 weeks  PLANNED INTERVENTIONS: Therapeutic exercises, Therapeutic activity, Neuromuscular re-education, Balance training, Gait training, Patient/Family education, Electrical stimulation, Moist heat, Vasopneumatic device, and Manual therapy  PLAN FOR NEXT SESSION: 4 electrode co-contract VMS to patient's right quadriceps, no pain increase right LE there ex (O and CKC).   Corlis Angelica,CHRIS, PTA 10/30/2021, 9:56 AM

## 2021-11-04 ENCOUNTER — Encounter: Payer: Self-pay | Admitting: Physical Therapy

## 2021-11-04 ENCOUNTER — Ambulatory Visit: Payer: Medicare Other | Attending: Orthopedic Surgery | Admitting: Physical Therapy

## 2021-11-04 DIAGNOSIS — M25661 Stiffness of right knee, not elsewhere classified: Secondary | ICD-10-CM | POA: Insufficient documentation

## 2021-11-04 DIAGNOSIS — M25561 Pain in right knee: Secondary | ICD-10-CM | POA: Diagnosis not present

## 2021-11-04 DIAGNOSIS — R600 Localized edema: Secondary | ICD-10-CM | POA: Diagnosis not present

## 2021-11-04 DIAGNOSIS — G8929 Other chronic pain: Secondary | ICD-10-CM | POA: Insufficient documentation

## 2021-11-04 DIAGNOSIS — M6281 Muscle weakness (generalized): Secondary | ICD-10-CM | POA: Insufficient documentation

## 2021-11-04 NOTE — Therapy (Signed)
OUTPATIENT PHYSICAL THERAPY LOWER EXTREMITY TREATMENT   Patient Name: [name] MRN: [id number] DOB:1945/08/22, 76 y.o., female Today's Date: 11/04/2021   PT End of Session - 11/04/21 1013     Visit Number 3    Number of Visits 8    Date for PT Re-Evaluation 11/25/21    PT Start Time 0950    PT Stop Time 1038    PT Time Calculation (min) 48 min    Activity Tolerance Patient tolerated treatment well    Behavior During Therapy [facility] for tasks assessed/performed             Past Medical History:  Diagnosis Date   Allergy    seasonal   Anxiety    Arthritis    [place]    Colon polyps    Complication of anesthesia    per pt, she woke up in the middle of last colon in 2014.   Depression    Diabetes mellitus, type 2 ([place]) 06/2010   Diabetic neuropathy ([place])    Diverticulosis    pt unaware   Esophageal stricture    Fibromyalgia    [place]    GERD (gastroesophageal reflux disease)    Hemorrhoids    Hiatal hernia    Hyperlipidemia    Hypertension    Menopause    Numbness    Peripheral vascular insufficiency (HCC)    Retinopathy    Bilateral   TIA (transient ischemic attack)    Tremor    Vitamin D deficiency    Past Surgical History:  Procedure Laterality Date   ABDOMINAL HYSTERECTOMY     partial and then total   ANKLE SURGERY Right    BACK SURGERY     COLONOSCOPY     ELBOW SURGERY     left   KNEE ARTHROSCOPY Right 09/12/2018   Procedure: ARTHROSCOPY KNEE; SYNOVECTOMY;  Surgeon: Gaynelle Arabian, MD;  [facility];  Service: Orthopedics;  Laterality: Right;  69mn   PATELLA REVISION Right 09/10/2021   Procedure: Resection arthroplasty right patella;  Surgeon: AGaynelle Arabian MD;  [facility];  Service: Orthopedics;  Laterality: Right;   REFRACTIVE SURGERY  2003   SPINAL CORD STIMULATOR INSERTION N/A 01/01/2021   Procedure: SPINAL CORD STIMULATOR PLACEMENT;  Surgeon: BMelina Schools MD;  [place]  Service: Orthopedics;  Laterality: N/A;    TOTAL KNEE ARTHROPLASTY     bilateral   TOTAL KNEE REVISION Right 03/09/2018   Procedure: RIGHT TOTAL KNEE REVISION;  Surgeon: AGaynelle Arabian MD;  [facility];  Service: Orthopedics;  Laterality: Right;  1260m with abductor block   VESICOVAGINAL FISTULA CLOSURE W/ TAH  1981   Patient Active Problem List   Diagnosis Date Noted   Chronic pain 01/01/2021   Paresthesia 06/14/2019   Diabetic autonomic neuropathy associated with type 2 diabetes mellitus (HCDef[place]03/01/2020   Failed total knee arthroplasty (HCOakes[place]12/07/2017   Aortic atherosclerosis (HCGall[place]09/07/2017   Hardening of the aorta (main artery of the heart) (HCTraverse City09/07/2017   History of total knee replacement, bilateral 11/04/2017   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HCP[place]10/01/2015   Depression 02/28/2013   GERD (gastroesophageal reflux disease) 02/28/2013   Hiatal hernia with gastroesophageal reflux 02/28/2013   DDD (degenerative disc disease), lumbosacral 06/23/2012   Fibromyalgia syndrome 06/23/2012   Essential hypertension, benign 06/23/2012   Osteopenia 06/23/2012   Diabetes (HCHackle[place]03/20/2014   Obesity due to excess calories 10/29/2010   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   Personal history of colonic polyps 10/11/2009  REFERRING PROVIDER: Gaynelle Arabian MD  REFERRING DIAG: Aftercare following joint replacement surgery.  THERAPY DIAG:  Chronic pain of right knee  Stiffness of right knee, not elsewhere classified  Localized edema  Rationale for Evaluation and Treatment Rehabilitation  ONSET DATE: 09/10/21 (surgery date).  SUBJECTIVE:   SUBJECTIVE STATEMENT: No new complaints.   PRECAUTIONS: Other: No ultrasound.    OBJECTIVE:         TODAY'S TREATMENT:         11/04/21     Nustep x 10 mins L4  VMS to patient's right quadriceps (4 electrodes [place] e'stim 10 sec on f/b 10 sec rest) x 16 minutes for neuro re-education f/b Vaso x 15 mins for edema       ASSESSMENT:  CLINICAL  IMPRESSION: The patient presents to the clinic today s/p right patellar resection performed on 09/10/21. Her pain is 6/10. She reports doing okay after eval.  Rx focused on quad activation and  strengthening with [place] e-stim to facilitate quads. Pt did well with good mm contraction today and vaso end of session.       OBJECTIVE IMPAIRMENTS Abnormal gait, decreased activity tolerance, difficulty walking, decreased ROM, decreased strength, increased edema, and pain.   ACTIVITY LIMITATIONS standing, stairs, and locomotion level  PARTICIPATION LIMITATIONS: meal prep, cleaning, laundry, and yard work  PERSONAL FACTORS 1 comorbidity: multiple right knee surgeries.  are also affecting patient's functional outcome.   REHAB POTENTIAL: Fair    CLINICAL DECISION MAKING: Stable/uncomplicated  EVALUATION COMPLEXITY: Low   GOALS:  LONG TERM GOALS: Target date: 12/02/2021   Independent with an HEP. Baseline:  Goal status: INITIAL  2.  Sit to stand x 5 minutes with right knee pain not > 4/10. Baseline:  Goal status: INITIAL  3.  Walk a community distance with right knee pain not > 4/10. Baseline:  Goal status: INITIAL   PLAN: PT FREQUENCY: 2x/week  PT DURATION: 4 weeks  PLANNED INTERVENTIONS: Therapeutic exercises, Therapeutic activity, Neuromuscular re-education, Balance training, Gait training, Patient/Family education, Electrical stimulation, Moist heat, Vasopneumatic device, and Manual therapy  PLAN FOR NEXT SESSION: 4 electrode co-contract VMS to patient's right quadriceps, no pain increase right LE there ex (O and CKC).   Aljean Horiuchi, [place], PT 11/04/2021, 10:43 AM

## 2021-11-11 ENCOUNTER — Encounter: Payer: Self-pay | Admitting: Physical Therapy

## 2021-11-11 ENCOUNTER — Ambulatory Visit: Payer: Medicare Other | Admitting: Physical Therapy

## 2021-11-11 DIAGNOSIS — M25561 Pain in right knee: Secondary | ICD-10-CM | POA: Diagnosis not present

## 2021-11-11 DIAGNOSIS — M25661 Stiffness of right knee, not elsewhere classified: Secondary | ICD-10-CM

## 2021-11-11 DIAGNOSIS — G8929 Other chronic pain: Secondary | ICD-10-CM | POA: Diagnosis not present

## 2021-11-11 DIAGNOSIS — R600 Localized edema: Secondary | ICD-10-CM | POA: Diagnosis not present

## 2021-11-11 DIAGNOSIS — M6281 Muscle weakness (generalized): Secondary | ICD-10-CM | POA: Diagnosis not present

## 2021-11-11 NOTE — Therapy (Signed)
OUTPATIENT PHYSICAL THERAPY LOWER EXTREMITY TREATMENT   Patient Name: [name] MRN: [id number] DOB:[birth year], 76 y.o., female Today's Date: 11/11/2021   PT End of Session - 11/11/21 1046     Visit Number 4    Number of Visits 8    Date for PT Re-Evaluation 11/25/21    PT Start Time 0951    PT Stop Time 1024    PT Time Calculation (min) 33 min    Activity Tolerance Patient tolerated treatment well    Behavior During Therapy [facility] for tasks assessed/performed             Past Medical History:  Diagnosis Date   Allergy    seasonal   Anxiety    Arthritis    [place]    Colon polyps    Complication of anesthesia    per pt, she woke up in the middle of last colon in 2014.   Depression    Diabetes mellitus, type 2 ([place]) 06/2010   Diabetic neuropathy ([facility])    Diverticulosis    pt unaware   Esophageal stricture    Fibromyalgia    [place]    GERD (gastroesophageal reflux disease)    Hemorrhoids    Hiatal hernia    Hyperlipidemia    Hypertension    Menopause    Numbness    Peripheral vascular insufficiency (HCC)    Retinopathy    Bilateral   TIA (transient ischemic attack)    Tremor    Vitamin D deficiency    Past Surgical History:  Procedure Laterality Date   ABDOMINAL HYSTERECTOMY     partial and then total   ANKLE SURGERY Right    BACK SURGERY     COLONOSCOPY     ELBOW SURGERY     left   KNEE ARTHROSCOPY Right 09/12/2018   Procedure: ARTHROSCOPY KNEE; SYNOVECTOMY;  Surgeon: Gaynelle Arabian, MD;  [facility];  Service: Orthopedics;  Laterality: Right;  52mn   PATELLA REVISION Right 09/10/2021   Procedure: Resection arthroplasty right patella;  Surgeon: AGaynelle Arabian MD;  [facility];  Service: Orthopedics;  Laterality: Right;   REFRACTIVE SURGERY  2003   SPINAL CORD STIMULATOR INSERTION N/A 01/01/2021   Procedure: SPINAL CORD STIMULATOR PLACEMENT;  Surgeon: BMelina Schools MD;  [place]  Service: Orthopedics;  Laterality: N/A;    TOTAL KNEE ARTHROPLASTY     bilateral   TOTAL KNEE REVISION Right 03/09/2018   Procedure: RIGHT TOTAL KNEE REVISION;  Surgeon: AGaynelle Arabian MD;  [facility];  Service: Orthopedics;  Laterality: Right;  1234m with abductor block   VESICOVAGINAL FISTULA CLOSURE W/ TAH  1981   Patient Active Problem List   Diagnosis Date Noted   Chronic pain 01/01/2021   Paresthesia 06/14/2019   Diabetic autonomic neuropathy associated with type 2 diabetes mellitus (HCBuena03/01/2020   Failed total knee arthroplasty (HCMarin[place]12/07/2017   Aortic atherosclerosis (HCLake Mary09/07/2017   Hardening of the aorta (main artery of the heart) (HCBernice09/07/2017   History of total knee replacement, bilateral 11/04/2017   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HCR[place]10/01/2015   Depression 02/28/2013   GERD (gastroesophageal reflux disease) 02/28/2013   Hiatal hernia with gastroesophageal reflux 02/28/2013   DDD (degenerative disc disease), lumbosacral 06/23/2012   Fibromyalgia syndrome 06/23/2012   Essential hypertension, benign 06/23/2012   Osteopenia 06/23/2012   Diabetes (HCK[place]03/20/2014   Obesity due to excess calories 10/29/2010   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   Personal history of colonic polyps 10/11/2009  REFERRING PROVIDER: Gaynelle Arabian MD  REFERRING DIAG: Aftercare following joint replacement surgery.  THERAPY DIAG:  Chronic pain of right knee  Stiffness of right knee, not elsewhere classified  Localized edema  Rationale for Evaluation and Treatment Rehabilitation  ONSET DATE: 09/10/21 (surgery date).  SUBJECTIVE:   SUBJECTIVE STATEMENT: Hurting a lot.  Just got back from beach.  Even had to use a wheelchair to avoid walking long distances. PRECAUTIONS: Other: No ultrasound.    OBJECTIVE: TODAY'S TREATMENT:     11/11/21      [place] to patient's right quadriceps (4 electrodes [place] e'stim 10 sec on f/b 10 sec rest) x 16 minutes for neuro re-education f/b STW/M x 10 minutes  to reduce tone in patient's right quadriceps.     ASSESSMENT:  CLINICAL IMPRESSION: Increased pain today due to vacation and increased activity at beach.  She did well with STW/M and had a notable trigger point in her right vastus lateralis.    OBJECTIVE IMPAIRMENTS Abnormal gait, decreased activity tolerance, difficulty walking, decreased ROM, decreased strength, increased edema, and pain.   ACTIVITY LIMITATIONS standing, stairs, and locomotion level  PARTICIPATION LIMITATIONS: meal prep, cleaning, laundry, and yard work  PERSONAL FACTORS 1 comorbidity: multiple right knee surgeries.  are also affecting patient's functional outcome.   REHAB POTENTIAL: Fair    CLINICAL DECISION MAKING: Stable/uncomplicated  EVALUATION COMPLEXITY: Low   GOALS:  LONG TERM GOALS: Target date: 12/09/2021   Independent with an HEP. Baseline:  Goal status: INITIAL  2.  Sit to stand x 5 minutes with right knee pain not > 4/10. Baseline:  Goal status: INITIAL  3.  Walk a community distance with right knee pain not > 4/10. Baseline:  Goal status: INITIAL   PLAN: PT FREQUENCY: 2x/week  PT DURATION: 4 weeks  PLANNED INTERVENTIONS: Therapeutic exercises, Therapeutic activity, Neuromuscular re-education, Balance training, Gait training, Patient/Family education, Electrical stimulation, Moist heat, Vasopneumatic device, and Manual therapy  PLAN FOR NEXT SESSION: 4 electrode co-contract VMS to patient's right quadriceps, no pain increase right LE there ex (O and CKC).   Tajee Savant, [place], PT 11/11/2021, 10:54 AM

## 2021-11-13 ENCOUNTER — Encounter: Payer: Self-pay | Admitting: *Deleted

## 2021-11-13 ENCOUNTER — Ambulatory Visit: Payer: Medicare Other | Admitting: *Deleted

## 2021-11-13 DIAGNOSIS — M6281 Muscle weakness (generalized): Secondary | ICD-10-CM

## 2021-11-13 DIAGNOSIS — M25561 Pain in right knee: Secondary | ICD-10-CM | POA: Diagnosis not present

## 2021-11-13 DIAGNOSIS — G8929 Other chronic pain: Secondary | ICD-10-CM | POA: Diagnosis not present

## 2021-11-13 DIAGNOSIS — R600 Localized edema: Secondary | ICD-10-CM

## 2021-11-13 DIAGNOSIS — M25661 Stiffness of right knee, not elsewhere classified: Secondary | ICD-10-CM | POA: Diagnosis not present

## 2021-11-13 NOTE — Therapy (Signed)
OUTPATIENT PHYSICAL THERAPY LOWER EXTREMITY TREATMENT   Patient Name: [name] MRN: [id number] DOB:[birth year], 76 y.o., female Today's Date: 11/13/2021   PT End of Session - 11/13/21 1015     Visit Number 5    Number of Visits 8    Date for PT Re-Evaluation 11/25/21    PT Start Time 0945    PT Stop Time 1035    PT Time Calculation (min) 50 min             Past Medical History:  Diagnosis Date   Allergy    seasonal   Anxiety    Arthritis    [place]    Colon polyps    Complication of anesthesia    per pt, she woke up in the middle of last colon in 2014.   Depression    Diabetes mellitus, type 2 ([place]) 06/2010   Diabetic neuropathy ([place])    Diverticulosis    pt unaware   Esophageal stricture    Fibromyalgia    [place]    GERD (gastroesophageal reflux disease)    Hemorrhoids    Hiatal hernia    Hyperlipidemia    Hypertension    Menopause    Numbness    Peripheral vascular insufficiency (HCC)    Retinopathy    Bilateral   TIA (transient ischemic attack)    Tremor    Vitamin D deficiency    Past Surgical History:  Procedure Laterality Date   ABDOMINAL HYSTERECTOMY     partial and then total   ANKLE SURGERY Right    BACK SURGERY     COLONOSCOPY     ELBOW SURGERY     left   KNEE ARTHROSCOPY Right 09/12/2018   Procedure: ARTHROSCOPY KNEE; SYNOVECTOMY;  Surgeon: Gaynelle Arabian, MD;  [facility];  Service: Orthopedics;  Laterality: Right;  41mn   PATELLA REVISION Right 09/10/2021   Procedure: Resection arthroplasty right patella;  Surgeon: AGaynelle Arabian MD;  [facility];  Service: Orthopedics;  Laterality: Right;   REFRACTIVE SURGERY  2003   SPINAL CORD STIMULATOR INSERTION N/A 01/01/2021   Procedure: SPINAL CORD STIMULATOR PLACEMENT;  Surgeon: BMelina Schools MD;  [facility]  Service: Orthopedics;  Laterality: N/A;   TOTAL KNEE ARTHROPLASTY     bilateral   TOTAL KNEE REVISION Right 03/09/2018   Procedure: RIGHT TOTAL KNEE  REVISION;  Surgeon: AGaynelle Arabian MD;  [facility];  Service: Orthopedics;  Laterality: Right;  128m with abductor block   VESICOVAGINAL FISTULA CLOSURE W/ TAH  1981   Patient Active Problem List   Diagnosis Date Noted   Chronic pain 01/01/2021   Paresthesia 06/14/2019   Diabetic autonomic neuropathy associated with type 2 diabetes mellitus (HCPrairie City03/01/2020   Failed total knee arthroplasty (HCWeeki [place]12/07/2017   Aortic atherosclerosis (HCUtah09/07/2017   Hardening of the aorta (main artery of the heart) (HCOneida09/07/2017   History of total knee replacement, bilateral 11/04/2017   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HCVandergr[place]10/01/2015   Depression 02/28/2013   GERD (gastroesophageal reflux disease) 02/28/2013   Hiatal hernia with gastroesophageal reflux 02/28/2013   DDD (degenerative disc disease), lumbosacral 06/23/2012   Fibromyalgia syndrome 06/23/2012   Essential hypertension, benign 06/23/2012   Osteopenia 06/23/2012   Diabetes (HCAbiquiu03/20/2014   Obesity due to excess calories 10/29/2010   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   Personal history of colonic polyps 10/11/2009   REFERRING PROVIDER: FrGaynelle ArabianD  REFERRING DIAG: Aftercare following joint replacement surgery.  THERAPY DIAG:  Chronic  pain of right knee  Stiffness of right knee, not elsewhere classified  Localized edema  Muscle weakness (generalized)  Rationale for Evaluation and Treatment Rehabilitation  ONSET DATE: 09/10/21 (surgery date).  SUBJECTIVE:   SUBJECTIVE STATEMENT:     Pt reports doing a little better today with knee pain 4-5/10  PRECAUTIONS: Other: No ultrasound.    OBJECTIVE: TODAY'S TREATMENT:     11/13/21  LAQ's 5# x15 hold 5 secs with ball/adductor squeeze Manual resistance knee ext. Isometrics 2x10 hold 5 secs at 90 degree   [place] to patient's right quadriceps (4 electrodes [place] e'stim 10 sec on f/b 10 sec rest) x 12 minutes for neuro re-education with SAQ's 2 # wt.    f/b STW/M x 83mnutes to reduce tone in patient's right quadriceps.  Modalities:  Vaso x 10 mins 34 degrees RT knee.   ASSESSMENT:  CLINICAL IMPRESSION: Pt arrived today doing fair with RT knee pain less 5-6/10 today. Rx focused on RT quad activation as well as strengthening at different angles using RTur[place]stim for end-range facilitation. Vaso end of session for pain/edema control  OBJECTIVE IMPAIRMENTS Abnormal gait, decreased activity tolerance, difficulty walking, decreased ROM, decreased strength, increased edema, and pain.   ACTIVITY LIMITATIONS standing, stairs, and locomotion level  PARTICIPATION LIMITATIONS: meal prep, cleaning, laundry, and yard work  PERSONAL FACTORS 1 comorbidity: multiple right knee surgeries.  are also affecting patient's functional outcome.   REHAB POTENTIAL: Fair    CLINICAL DECISION MAKING: Stable/uncomplicated  EVALUATION COMPLEXITY: Low   GOALS:  LONG TERM GOALS: Target date: 12/11/2021   Independent with an HEP. Baseline:  Goal status: INITIAL  2.  Sit to stand x 5 minutes with right knee pain not > 4/10. Baseline:  Goal status: INITIAL  3.  Walk a community distance with right knee pain not > 4/10. Baseline:  Goal status: INITIAL   PLAN: PT FREQUENCY: 2x/week  PT DURATION: 4 weeks  PLANNED INTERVENTIONS: Therapeutic exercises, Therapeutic activity, Neuromuscular re-education, Balance training, Gait training, Patient/Family education, Electrical stimulation, Moist heat, Vasopneumatic device, and Manual therapy  PLAN FOR NEXT SESSION: 4 electrode co-contract VMS to patient's right quadriceps, no pain increase right LE there ex (O and CKC).   Bob Daversa,CHRIS, PTA 11/13/2021, 1:05 PM

## 2021-11-17 ENCOUNTER — Other Ambulatory Visit: Payer: Self-pay | Admitting: Family Medicine

## 2021-11-17 DIAGNOSIS — F320 Major depressive disorder, single episode, mild: Secondary | ICD-10-CM

## 2021-11-17 DIAGNOSIS — Z79899 Other long term (current) drug therapy: Secondary | ICD-10-CM

## 2021-11-18 ENCOUNTER — Ambulatory Visit: Payer: Medicare Other | Admitting: *Deleted

## 2021-11-18 ENCOUNTER — Encounter: Payer: Self-pay | Admitting: *Deleted

## 2021-11-18 DIAGNOSIS — G8929 Other chronic pain: Secondary | ICD-10-CM

## 2021-11-18 DIAGNOSIS — R600 Localized edema: Secondary | ICD-10-CM

## 2021-11-18 DIAGNOSIS — M25561 Pain in right knee: Secondary | ICD-10-CM | POA: Diagnosis not present

## 2021-11-18 DIAGNOSIS — M25661 Stiffness of right knee, not elsewhere classified: Secondary | ICD-10-CM | POA: Diagnosis not present

## 2021-11-18 DIAGNOSIS — M6281 Muscle weakness (generalized): Secondary | ICD-10-CM

## 2021-11-18 NOTE — Therapy (Signed)
OUTPATIENT PHYSICAL THERAPY LOWER EXTREMITY TREATMENT   Patient Name: [name] MRN: [id number] DOB:December 15, 1945, 76 y.o., female Today's Date: 11/18/2021   PT End of Session - 11/18/21 0949     Visit Number 6    Number of Visits 8    Date for PT Re-Evaluation 11/25/21    PT Start Time 0945    PT Stop Time 1034    PT Time Calculation (min) 49 min             Past Medical History:  Diagnosis Date   Allergy    seasonal   Anxiety    Arthritis    [place]    Colon polyps    Complication of anesthesia    per pt, she woke up in the middle of last colon in 2014.   Depression    Diabetes mellitus, type 2 ([place]) 06/2010   Diabetic neuropathy ([place])    Diverticulosis    pt unaware   Esophageal stricture    Fibromyalgia    [place]    GERD (gastroesophageal reflux disease)    Hemorrhoids    Hiatal hernia    Hyperlipidemia    Hypertension    Menopause    Numbness    Peripheral vascular insufficiency (HCC)    Retinopathy    Bilateral   TIA (transient ischemic attack)    Tremor    Vitamin D deficiency    Past Surgical History:  Procedure Laterality Date   ABDOMINAL HYSTERECTOMY     partial and then total   ANKLE SURGERY Right    BACK SURGERY     COLONOSCOPY     ELBOW SURGERY     left   KNEE ARTHROSCOPY Right 09/12/2018   Procedure: ARTHROSCOPY KNEE; SYNOVECTOMY;  Surgeon: Gaynelle Arabian, MD;  [facility];  Service: Orthopedics;  Laterality: Right;  1mn   PATELLA REVISION Right 09/10/2021   Procedure: Resection arthroplasty right patella;  Surgeon: AGaynelle Arabian MD;  [facility];  Service: Orthopedics;  Laterality: Right;   REFRACTIVE SURGERY  2003   SPINAL CORD STIMULATOR INSERTION N/A 01/01/2021   Procedure: SPINAL CORD STIMULATOR PLACEMENT;  Surgeon: BMelina Schools MD;  [facility]  Service: Orthopedics;  Laterality: N/A;   TOTAL KNEE ARTHROPLASTY     bilateral   TOTAL KNEE REVISION Right 03/09/2018   Procedure: RIGHT TOTAL KNEE  REVISION;  Surgeon: AGaynelle Arabian MD;  [facility];  Service: Orthopedics;  Laterality: Right;  1253m with abductor block   VESICOVAGINAL FISTULA CLOSURE W/ TAH  1981   Patient Active Problem List   Diagnosis Date Noted   Chronic pain 01/01/2021   Paresthesia 06/14/2019   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arlyss03/01/2020   Failed total knee arthroplasty (HCGlencoe12/07/2017   Aortic atherosclerosis (HCArtond[place]09/07/2017   Hardening of the aorta (main artery of the heart) (HCWinf[place]09/07/2017   History of total knee replacement, bilateral 11/04/2017   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HCSim[place]10/01/2015   Depression 02/28/2013   GERD (gastroesophageal reflux disease) 02/28/2013   Hiatal hernia with gastroesophageal reflux 02/28/2013   DDD (degenerative disc disease), lumbosacral 06/23/2012   Fibromyalgia syndrome 06/23/2012   Essential hypertension, benign 06/23/2012   Osteopenia 06/23/2012   Diabetes (HCColby03/20/2014   Obesity due to excess calories 10/29/2010   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   Personal history of colonic polyps 10/11/2009   REFERRING PROVIDER: FrGaynelle ArabianD  REFERRING DIAG: Aftercare following joint replacement surgery.  THERAPY DIAG:  Chronic  pain of right knee  Stiffness of right knee, not elsewhere classified  Localized edema  Muscle weakness (generalized)  Rationale for Evaluation and Treatment Rehabilitation  ONSET DATE: 09/10/21 (surgery date).  SUBJECTIVE:   SUBJECTIVE STATEMENT:     Pt reports doing a little better after last Rx. Today with knee pain 4-5/10  PRECAUTIONS: Other: No ultrasound.    OBJECTIVE: TODAY'S TREATMENT:     11/18/21 Standing at door toe ups with quad set x 10 hold 5 secs LAQ's 5# 2x10 hold 10secs with ball/adductor squeeze Manual resistance knee ext. Isometrics 2x10 hold 5 secs at 90 degree Glute isometrics x10 hold 5 secs and sit to stand with glute activation x10 to  decrease knee pressure/pain  [place] to patient's right quadriceps (4 electrodes [place] e'stim 10 sec on f/b 10 sec rest) x 12 minutes for neuro re-education with SAQ's 2 # wt.     Modalities:  Vaso x 10 mins 34 degrees RT knee.   ASSESSMENT:  CLINICAL IMPRESSION: Pt arrived today reporting doing some better after last Rx. Going from sit to stand is still the hardest due to pain and tightness. Rx focused on quad activation as well as glute engagement with sit to stand. [place] stim used to facilitate RT VMO activation. Vaso end of session.    OBJECTIVE IMPAIRMENTS Abnormal gait, decreased activity tolerance, difficulty walking, decreased ROM, decreased strength, increased edema, and pain.   ACTIVITY LIMITATIONS standing, stairs, and locomotion level  PARTICIPATION LIMITATIONS: meal prep, cleaning, laundry, and yard work  PERSONAL FACTORS 1 comorbidity: multiple right knee surgeries.  are also affecting patient's functional outcome.   REHAB POTENTIAL: Fair    CLINICAL DECISION MAKING: Stable/uncomplicated  EVALUATION COMPLEXITY: Low   GOALS:  LONG TERM GOALS: Target date: 12/16/2021   Independent with an HEP. Baseline:  Goal status: INITIAL  2.  Sit to stand x 5 minutes with right knee pain not > 4/10. Baseline:  Goal status: INITIAL  3.  Walk a community distance with right knee pain not > 4/10. Baseline:  Goal status: INITIAL   PLAN: PT FREQUENCY: 2x/week  PT DURATION: 4 weeks  PLANNED INTERVENTIONS: Therapeutic exercises, Therapeutic activity, Neuromuscular re-education, Balance training, Gait training, Patient/Family education, Electrical stimulation, Moist heat, Vasopneumatic device, and Manual therapy  PLAN FOR NEXT SESSION: 4 electrode co-contract VMS to patient's right quadriceps, no pain increase right LE there ex (O and CKC).   Lorren Rossetti,CHRIS, PTA 11/18/2021, 10:47 AM

## 2021-11-20 ENCOUNTER — Ambulatory Visit: Payer: Medicare Other | Admitting: *Deleted

## 2021-11-20 ENCOUNTER — Encounter: Payer: Self-pay | Admitting: *Deleted

## 2021-11-20 DIAGNOSIS — M6281 Muscle weakness (generalized): Secondary | ICD-10-CM | POA: Diagnosis not present

## 2021-11-20 DIAGNOSIS — M25661 Stiffness of right knee, not elsewhere classified: Secondary | ICD-10-CM | POA: Diagnosis not present

## 2021-11-20 DIAGNOSIS — R600 Localized edema: Secondary | ICD-10-CM

## 2021-11-20 DIAGNOSIS — M25561 Pain in right knee: Secondary | ICD-10-CM | POA: Diagnosis not present

## 2021-11-20 DIAGNOSIS — G8929 Other chronic pain: Secondary | ICD-10-CM | POA: Diagnosis not present

## 2021-11-20 NOTE — Therapy (Signed)
OUTPATIENT PHYSICAL THERAPY LOWER EXTREMITY TREATMENT   Patient Name: [name] MRN: [id number] DOB:07/17/1945, 76 y.o., female Today's Date: 11/20/2021   PT End of Session - 11/20/21 0957     Visit Number 7    Number of Visits 8    Date for PT Re-Evaluation 11/25/21    PT Start Time 0947    PT Stop Time 1038    PT Time Calculation (min) 51 min             Past Medical History:  Diagnosis Date   Allergy    seasonal   Anxiety    Arthritis    [place]    Colon polyps    Complication of anesthesia    per pt, she woke up in the middle of last colon in 2014.   Depression    Diabetes mellitus, type 2 ([place]) 06/2010   Diabetic neuropathy ([place])    Diverticulosis    pt unaware   Esophageal stricture    Fibromyalgia    [place]    GERD (gastroesophageal reflux disease)    Hemorrhoids    Hiatal hernia    Hyperlipidemia    Hypertension    Menopause    Numbness    Peripheral vascular insufficiency (HCC)    Retinopathy    Bilateral   TIA (transient ischemic attack)    Tremor    Vitamin D deficiency    Past Surgical History:  Procedure Laterality Date   ABDOMINAL HYSTERECTOMY     partial and then total   ANKLE SURGERY Right    BACK SURGERY     COLONOSCOPY     ELBOW SURGERY     left   KNEE ARTHROSCOPY Right 09/12/2018   Procedure: ARTHROSCOPY KNEE; SYNOVECTOMY;  Surgeon: Gaynelle Arabian, MD;  [facility];  Service: Orthopedics;  Laterality: Right;  68mn   PATELLA REVISION Right 09/10/2021   Procedure: Resection arthroplasty right patella;  Surgeon: AGaynelle Arabian MD;  [facility];  Service: Orthopedics;  Laterality: Right;   REFRACTIVE SURGERY  2003   SPINAL CORD STIMULATOR INSERTION N/A 01/01/2021   Procedure: SPINAL CORD STIMULATOR PLACEMENT;  Surgeon: BMelina Schools MD;  [facility]  Service: Orthopedics;  Laterality: N/A;   TOTAL KNEE ARTHROPLASTY     bilateral   TOTAL KNEE REVISION Right 03/09/2018   Procedure: RIGHT TOTAL KNEE  REVISION;  Surgeon: AGaynelle Arabian MD;  [facility];  Service: Orthopedics;  Laterality: Right;  1273m with abductor block   VESICOVAGINAL FISTULA CLOSURE W/ TAH  1981   Patient Active Problem List   Diagnosis Date Noted   Chronic pain 01/01/2021   Paresthesia 06/14/2019   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aswell [place]03/01/2020   Failed total knee arthroplasty (HCWashington12/07/2017   Aortic atherosclerosis (HCFredonia09/07/2017   Hardening of the aorta (main artery of the heart) (HCArcadia09/07/2017   History of total knee replacement, bilateral 11/04/2017   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HCFox Chapel10/01/2015   Depression 02/28/2013   GERD (gastroesophageal reflux disease) 02/28/2013   Hiatal hernia with gastroesophageal reflux 02/28/2013   DDD (degenerative disc disease), lumbosacral 06/23/2012   Fibromyalgia syndrome 06/23/2012   Essential hypertension, benign 06/23/2012   Osteopenia 06/23/2012   Diabetes (HCWilliamsburg03/20/2014   Obesity due to excess calories 10/29/2010   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   Personal history of colonic polyps 10/11/2009   REFERRING PROVIDER: FrGaynelle ArabianD  REFERRING DIAG: Aftercare following joint replacement surgery.  THERAPY DIAG:  Chronic  pain of right knee  Stiffness of right knee, not elsewhere classified  Localized edema  Muscle weakness (generalized)  Rationale for Evaluation and Treatment Rehabilitation  ONSET DATE: 09/10/21 (surgery date).  SUBJECTIVE:   SUBJECTIVE STATEMENT:     Pt reports doing a little better after last Rx and seeing MD and he wants her to continue with PT. MD feels Pt's patella is still hrealing underneath  PRECAUTIONS: Other: No ultrasound.    OBJECTIVE: TODAY'S TREATMENT:     11/20/21 Standing at door toe ups with quad set 2x 10 hold 5 secs LAQ's 5# 3x10 hold 10secs with ball/adductor squeeze Quad sets with knees flexed at 90 degrees and then 100 degrees x 10 hold 5  secs in sitting  Glute isometrics x10 hold 5 secs and sit to stand with glute activation 2 x10 to decrease knee pressure/pain Used  UEs to assist to decrease knee pain and focus on glutes  [place] to patient's right quadriceps (4 electrodes [place] e'stim 10 sec on f/b 10 sec rest) x 10. minutes for neuro re-education with SAQ's 5 # wt.     Modalities:  Vaso x 10 mins 34 degrees RT knee.   ASSESSMENT:  CLINICAL IMPRESSION: Pt arrived today reporting doing some better after last Rx 10-15%. She had F/U with MD and he wants her to continue with PT and reports to her that the underside of her patella is still healing and is probably still causing some pain.  Going from sit to stand is still the hardest due to pain and tightness so Rx focused on quad and glute activation and strengthening.   OBJECTIVE IMPAIRMENTS Abnormal gait, decreased activity tolerance, difficulty walking, decreased ROM, decreased strength, increased edema, and pain.   ACTIVITY LIMITATIONS standing, stairs, and locomotion level  PARTICIPATION LIMITATIONS: meal prep, cleaning, laundry, and yard work  PERSONAL FACTORS 1 comorbidity: multiple right knee surgeries.  are also affecting patient's functional outcome.   REHAB POTENTIAL: Fair    CLINICAL DECISION MAKING: Stable/uncomplicated  EVALUATION COMPLEXITY: Low   GOALS:  LONG TERM GOALS: Target date: 12/18/2021   Independent with an HEP. Baseline:  Goal status: Partially met  2.  Sit to stand x 5 minutes with right knee pain not > 4/10. Baseline:  Goal status: Ongoing  3.  Walk a community distance with right knee pain not > 4/10. Baseline:  Goal status: Ongoing   PLAN: PT FREQUENCY: 2x/week  PT DURATION: 4 weeks  PLANNED INTERVENTIONS: Therapeutic exercises, Therapeutic activity, Neuromuscular re-education, Balance training, Gait training, Patient/Family education, Electrical stimulation, Moist heat, Vasopneumatic device, and Manual therapy  PLAN  FOR NEXT SESSION: 4 electrode co-contract VMS to patient's right quadriceps, no pain increase right LE there ex (O and CKC). Recert for more visits   Casara Perrier,CHRIS, PTA 11/20/2021, 10:42 AM

## 2021-11-25 ENCOUNTER — Ambulatory Visit: Payer: Medicare Other | Admitting: *Deleted

## 2021-11-25 ENCOUNTER — Encounter: Payer: Self-pay | Admitting: *Deleted

## 2021-11-25 DIAGNOSIS — M25561 Pain in right knee: Secondary | ICD-10-CM | POA: Diagnosis not present

## 2021-11-25 DIAGNOSIS — M6281 Muscle weakness (generalized): Secondary | ICD-10-CM | POA: Diagnosis not present

## 2021-11-25 DIAGNOSIS — M25661 Stiffness of right knee, not elsewhere classified: Secondary | ICD-10-CM

## 2021-11-25 DIAGNOSIS — G8929 Other chronic pain: Secondary | ICD-10-CM

## 2021-11-25 DIAGNOSIS — R600 Localized edema: Secondary | ICD-10-CM

## 2021-11-25 NOTE — Therapy (Signed)
OUTPATIENT PHYSICAL THERAPY LOWER EXTREMITY TREATMENT   Patient Name: [name] MRN: [id number] DOB:04-26-1945, 76 y.o., female Today's Date: 11/25/2021   PT End of Session - 11/25/21 0950     Visit Number 9    Number of Visits 12    Date for PT Re-Evaluation 12/09/21    PT Start Time 0948    PT Stop Time 1035    PT Time Calculation (min) 47 min             Past Medical History:  Diagnosis Date   Allergy    seasonal   Anxiety    Arthritis    [place]    Colon polyps    Complication of anesthesia    per pt, she woke up in the middle of last colon in 2014.   Depression    Diabetes mellitus, type 2 ([place]) 06/2010   Diabetic neuropathy ([place])    Diverticulosis    pt unaware   Esophageal stricture    Fibromyalgia    [place]    GERD (gastroesophageal reflux disease)    Hemorrhoids    Hiatal hernia    Hyperlipidemia    Hypertension    Menopause    Numbness    Peripheral vascular insufficiency (HCC)    Retinopathy    Bilateral   TIA (transient ischemic attack)    Tremor    Vitamin D deficiency    Past Surgical History:  Procedure Laterality Date   ABDOMINAL HYSTERECTOMY     partial and then total   ANKLE SURGERY Right    BACK SURGERY     COLONOSCOPY     ELBOW SURGERY     left   KNEE ARTHROSCOPY Right 09/12/2018   Procedure: ARTHROSCOPY KNEE; SYNOVECTOMY;  Surgeon: Gaynelle Arabian, MD;  [facility];  Service: Orthopedics;  Laterality: Right;  17mn   PATELLA REVISION Right 09/10/2021   Procedure: Resection arthroplasty right patella;  Surgeon: AGaynelle Arabian MD;  [facility];  Service: Orthopedics;  Laterality: Right;   REFRACTIVE SURGERY  2003   SPINAL CORD STIMULATOR INSERTION N/A 01/01/2021   Procedure: SPINAL CORD STIMULATOR PLACEMENT;  Surgeon: BMelina Schools MD;  [facility]  Service: Orthopedics;  Laterality: N/A;   TOTAL KNEE ARTHROPLASTY     bilateral   TOTAL KNEE REVISION Right 03/09/2018   Procedure: RIGHT TOTAL KNEE  REVISION;  Surgeon: AGaynelle Arabian MD;  [facility];  Service: Orthopedics;  Laterality: Right;  1222m with abductor block   VESICOVAGINAL FISTULA CLOSURE W/ TAH  1981   Patient Active Problem List   Diagnosis Date Noted   Chronic pain 01/01/2021   Paresthesia 06/14/2019   Diabetic autonomic neuropathy associated with type 2 diabetes mellitus (HCEl [place]03/01/2020   Failed total knee arthroplasty (HCGolovin12/07/2017   Aortic atherosclerosis (HCNew York09/07/2017   Hardening of the aorta (main artery of the heart) (HCCedar Ridge09/07/2017   History of total knee replacement, bilateral 11/04/2017   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HCCopperton10/01/2015   Depression 02/28/2013   GERD (gastroesophageal reflux disease) 02/28/2013   Hiatal hernia with gastroesophageal reflux 02/28/2013   DDD (degenerative disc disease), lumbosacral 06/23/2012   Fibromyalgia syndrome 06/23/2012   Essential hypertension, benign 06/23/2012   Osteopenia 06/23/2012   Diabetes (HCLa [place]iga03/20/2014   Obesity due to excess calories 10/29/2010   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   Personal history of colonic polyps 10/11/2009   REFERRING PROVIDER: FrGaynelle ArabianD  REFERRING DIAG: Aftercare following joint replacement surgery.  THERAPY DIAG:  Chronic  pain of right knee  Stiffness of right knee, not elsewhere classified  Localized edema  Muscle weakness (generalized)  Rationale for Evaluation and Treatment Rehabilitation  ONSET DATE: 09/10/21 (surgery date).  SUBJECTIVE:   SUBJECTIVE STATEMENT:      Pt arrived today with RT knee pain 6/10.        PRECAUTIONS: Other: No ultrasound.    OBJECTIVE: TODAY'S TREATMENT:     11/25/21 Standing at door toe ups with quad set 2x 10 hold 5 secs LAQ's 5# 3x10 hold 10secs with ball/adductor squeeze Quad sets with knees flexed at 90 degrees and then 100 degrees x 10 hold 5 secs in sitting with manual resist  Glute isometrics x10 hold 5 secs and sit to stand with glute  activation 2 x10 to decrease knee pressure/pain Used  UEs to assist to decrease knee pain and focus on glutes  [place] to patient's right quadriceps (4 electrodes [place] e'stim 10 sec on f/b 10 sec rest) x 10. minutes for neuro re-education with SAQ's 5 # wt.     Modalities:  Vaso x 10 mins 34 degrees RT knee.   ASSESSMENT:  CLINICAL IMPRESSION:     Pt arrived today doing fairly well, but still with knee pain at times. Pt was able to continue with RT knee exs for quad and glute activation and strengthening. Pt did well with Rx, but challenged with glute activation with sit to stand. Pt arrived today  OBJECTIVE IMPAIRMENTS Abnormal gait, decreased activity tolerance, difficulty walking, decreased ROM, decreased strength, increased edema, and pain.   ACTIVITY LIMITATIONS standing, stairs, and locomotion level  PARTICIPATION LIMITATIONS: meal prep, cleaning, laundry, and yard work  PERSONAL FACTORS 1 comorbidity: multiple right knee surgeries.  are also affecting patient's functional outcome.   REHAB POTENTIAL: Fair    CLINICAL DECISION MAKING: Stable/uncomplicated  EVALUATION COMPLEXITY: Low   GOALS:  LONG TERM GOALS: Target date: 12/23/2021   Independent with an HEP. Baseline:  Goal status: Partially met  2.  Sit to stand x 5 minutes with right knee pain not > 4/10. Baseline:  Goal status: Ongoing  3.  Walk a community distance with right knee pain not > 4/10. Baseline:  Goal status: Ongoing   PLAN: PT FREQUENCY: 2x/week  PT DURATION: 4 weeks  PLANNED INTERVENTIONS: Therapeutic exercises, Therapeutic activity, Neuromuscular re-education, Balance training, Gait training, Patient/Family education, Electrical stimulation, Moist heat, Vasopneumatic device, and Manual therapy  PLAN FOR NEXT SESSION: 4 electrode co-contract VMS to patient's right quadriceps, no pain increase right LE there ex (O and CKC). Recert for more visits   Royce Sciara,CHRIS, PTA 11/25/2021, 12:59 PM

## 2021-11-27 ENCOUNTER — Encounter: Payer: Self-pay | Admitting: *Deleted

## 2021-11-27 ENCOUNTER — Ambulatory Visit: Payer: Medicare Other | Admitting: *Deleted

## 2021-11-27 DIAGNOSIS — G8929 Other chronic pain: Secondary | ICD-10-CM

## 2021-11-27 DIAGNOSIS — M25661 Stiffness of right knee, not elsewhere classified: Secondary | ICD-10-CM

## 2021-11-27 DIAGNOSIS — M6281 Muscle weakness (generalized): Secondary | ICD-10-CM

## 2021-11-27 DIAGNOSIS — R600 Localized edema: Secondary | ICD-10-CM

## 2021-11-27 DIAGNOSIS — M25561 Pain in right knee: Secondary | ICD-10-CM | POA: Diagnosis not present

## 2021-11-27 NOTE — Therapy (Signed)
OUTPATIENT PHYSICAL THERAPY LOWER EXTREMITY TREATMENT   Patient Name: [name] MRN: [id number] DOB:July 13, 1945, 76 y.o., female Today's Date: 11/27/2021   PT End of Session - 11/27/21 0949     Visit Number 10    Number of Visits 12    Date for PT Re-Evaluation 12/09/21    PT Start Time 0946    PT Stop Time 1042    PT Time Calculation (min) 56 min             Past Medical History:  Diagnosis Date   Allergy    seasonal   Anxiety    Arthritis    [place]    Colon polyps    Complication of anesthesia    per pt, she woke up in the middle of last colon in 2014.   Depression    Diabetes mellitus, type 2 ([place]) 06/2010   Diabetic neuropathy ([place])    Diverticulosis    pt unaware   Esophageal stricture    Fibromyalgia    [place]    GERD (gastroesophageal reflux disease)    Hemorrhoids    Hiatal hernia    Hyperlipidemia    Hypertension    Menopause    Numbness    Peripheral vascular insufficiency (HCC)    Retinopathy    Bilateral   TIA (transient ischemic attack)    Tremor    Vitamin D deficiency    Past Surgical History:  Procedure Laterality Date   ABDOMINAL HYSTERECTOMY     partial and then total   ANKLE SURGERY Right    BACK SURGERY     COLONOSCOPY     ELBOW SURGERY     left   KNEE ARTHROSCOPY Right 09/12/2018   Procedure: ARTHROSCOPY KNEE; SYNOVECTOMY;  Surgeon: Gaynelle Arabian, MD;  [facility];  Service: Orthopedics;  Laterality: Right;  50mn   PATELLA REVISION Right 09/10/2021   Procedure: Resection arthroplasty right patella;  Surgeon: AGaynelle Arabian MD;  [facility];  Service: Orthopedics;  Laterality: Right;   REFRACTIVE SURGERY  2003   SPINAL CORD STIMULATOR INSERTION N/A 01/01/2021   Procedure: SPINAL CORD STIMULATOR PLACEMENT;  Surgeon: BMelina Schools MD;  [facility]  Service: Orthopedics;  Laterality: N/A;   TOTAL KNEE ARTHROPLASTY     bilateral   TOTAL KNEE REVISION Right 03/09/2018   Procedure: RIGHT TOTAL KNEE  REVISION;  Surgeon: AGaynelle Arabian MD;  [facility];  Service: Orthopedics;  Laterality: Right;  1257m with abductor block   VESICOVAGINAL FISTULA CLOSURE W/ TAH  1981   Patient Active Problem List   Diagnosis Date Noted   Chronic pain 01/01/2021   Paresthesia 06/14/2019   Diabetic autonomic neuropathy associated with type 2 diabetes mellitus (HCGlencoe03/01/2020   Failed total knee arthroplasty (HCAltoona12/07/2017   Aortic atherosclerosis (HCCheney09/07/2017   Hardening of the aorta (main artery of the heart) (HCCross Anchor09/07/2017   History of total knee replacement, bilateral 11/04/2017   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HCOsakis10/01/2015   Depression 02/28/2013   GERD (gastroesophageal reflux disease) 02/28/2013   Hiatal hernia with gastroesophageal reflux 02/28/2013   DDD (degenerative disc disease), lumbosacral 06/23/2012   Fibromyalgia syndrome 06/23/2012   Essential hypertension, benign 06/23/2012   Osteopenia 06/23/2012   Diabetes (HCLake [place]03/20/2014   Obesity due to excess calories 10/29/2010   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   Personal history of colonic polyps 10/11/2009   REFERRING PROVIDER: FrGaynelle ArabianD  REFERRING DIAG: Aftercare following joint replacement surgery.  THERAPY DIAG:  Chronic  pain of right knee  Stiffness of right knee, not elsewhere classified  Localized edema  Muscle weakness (generalized)  Rationale for Evaluation and Treatment Rehabilitation  ONSET DATE: 09/10/21 (surgery date).  SUBJECTIVE:   SUBJECTIVE STATEMENT:      Pt arrived today with RT knee pain 5 /10.  20% better since starting PT       PRECAUTIONS: Other: No ultrasound.    OBJECTIVE: TODAY'S TREATMENT:     11/27/21  Standing at door toe ups with quad set 2x 10 hold 5 secs LAQ's 5# 3x10 hold 10secs with ball/adductor squeeze Quad sets with knees flexed at 90 and 100 degrees and x 10 hold 5 secs in sitting with manual resist HS curls x30 green tband RT knee Glute  isometrics x10 hold 5 secs and sit to stand with glute activation 2 x10 to decrease knee pressure/pain Used  UEs to assist to decrease knee pain and focus on glutes  [place] to patient's right quadriceps (4 electrodes [place] e'stim 10 sec on f/b 10 sec rest) x 10. minutes for neuro re-education with SAQ's 5 # wt.     Modalities:  Vaso x 10 mins 34 degrees RT knee.   ASSESSMENT:  CLINICAL IMPRESSION:     Pt arrived today doing fairly well, and reports 20% improvement since starting PT. Rx focused on RT knee strengthening at 90-100 degrees of flexion as well as TKE. Russian stim used with SAQ's with good activation. Pt's cc is when she first gets up and then tries to walk. Pain is 7-8/10, but then decreases to 3-4/10.   OBJECTIVE IMPAIRMENTS Abnormal gait, decreased activity tolerance, difficulty walking, decreased ROM, decreased strength, increased edema, and pain.   ACTIVITY LIMITATIONS standing, stairs, and locomotion level  PARTICIPATION LIMITATIONS: meal prep, cleaning, laundry, and yard work  PERSONAL FACTORS 1 comorbidity: multiple right knee surgeries.  are also affecting patient's functional outcome.   REHAB POTENTIAL: Fair    CLINICAL DECISION MAKING: Stable/uncomplicated  EVALUATION COMPLEXITY: Low   GOALS:  LONG TERM GOALS: Target date: 12/25/2021   Independent with an HEP. Baseline:  Goal status: Partially met  2.  Sit to stand x 5  with right knee pain not > 4/10. Baseline:  Goal status: Ongoing      7-8/10 at times 8-24  3.  Walk a community distance with right knee pain not > 4/10. Baseline:  Goal status: Ongoing  due to pain 7-8/10 when first walking then gets less   PLAN: PT FREQUENCY: 2x/week  PT DURATION: 4 weeks  PLANNED INTERVENTIONS: Therapeutic exercises, Therapeutic activity, Neuromuscular re-education, Balance training, Gait training, Patient/Family education, Electrical stimulation, Moist heat, Vasopneumatic device, and Manual therapy  PLAN  FOR NEXT SESSION: 4 electrode co-contract VMS to patient's right quadriceps, no pain increase right LE there ex (O and CKC). Recert for more visits   Letesha Klecker,CHRIS, PTA 11/27/2021, 10:56 AM   Progress Note Reporting Period 10/28/21 to 11/27/21  See note below for Objective Data and Assessment of Progress/Goals. Patient progressing with a subjective improvement rating of 20%.   Mali Applegate MPT

## 2021-12-02 ENCOUNTER — Encounter: Payer: Self-pay | Admitting: *Deleted

## 2021-12-02 ENCOUNTER — Ambulatory Visit: Payer: Medicare Other | Admitting: *Deleted

## 2021-12-02 DIAGNOSIS — G8929 Other chronic pain: Secondary | ICD-10-CM

## 2021-12-02 DIAGNOSIS — R600 Localized edema: Secondary | ICD-10-CM

## 2021-12-02 DIAGNOSIS — M25561 Pain in right knee: Secondary | ICD-10-CM | POA: Diagnosis not present

## 2021-12-02 DIAGNOSIS — M6281 Muscle weakness (generalized): Secondary | ICD-10-CM | POA: Diagnosis not present

## 2021-12-02 DIAGNOSIS — M25661 Stiffness of right knee, not elsewhere classified: Secondary | ICD-10-CM | POA: Diagnosis not present

## 2021-12-02 NOTE — Therapy (Signed)
OUTPATIENT PHYSICAL THERAPY LOWER EXTREMITY TREATMENT   Patient Name: [name] MRN: [id number] DOB:[birth year], 76 y.o., female Today's Date: 12/02/2021   PT End of Session - 12/02/21 0952     Visit Number 11    Number of Visits 12    Date for PT Re-Evaluation 12/09/21    PT Start Time 0945    PT Stop Time 1040    PT Time Calculation (min) 55 min             Past Medical History:  Diagnosis Date   Allergy    seasonal   Anxiety    Arthritis    [place]    Colon polyps    Complication of anesthesia    per pt, she woke up in the middle of last colon in 2014.   Depression    Diabetes mellitus, type 2 ([place]) 06/2010   Diabetic neuropathy ([place])    Diverticulosis    pt unaware   Esophageal stricture    Fibromyalgia    [place]    GERD (gastroesophageal reflux disease)    Hemorrhoids    Hiatal hernia    Hyperlipidemia    Hypertension    Menopause    Numbness    Peripheral vascular insufficiency (HCC)    Retinopathy    Bilateral   TIA (transient ischemic attack)    Tremor    Vitamin D deficiency    Past Surgical History:  Procedure Laterality Date   ABDOMINAL HYSTERECTOMY     partial and then total   ANKLE SURGERY Right    BACK SURGERY     COLONOSCOPY     ELBOW SURGERY     left   KNEE ARTHROSCOPY Right 09/12/2018   Procedure: ARTHROSCOPY KNEE; SYNOVECTOMY;  Surgeon: Gaynelle Arabian, MD;  [facility];  Service: Orthopedics;  Laterality: Right;  82mn   PATELLA REVISION Right 09/10/2021   Procedure: Resection arthroplasty right patella;  Surgeon: AGaynelle Arabian MD;  [facility];  Service: Orthopedics;  Laterality: Right;   REFRACTIVE SURGERY  2003   SPINAL CORD STIMULATOR INSERTION N/A 01/01/2021   Procedure: SPINAL CORD STIMULATOR PLACEMENT;  Surgeon: BMelina Schools MD;  [facility]  Service: Orthopedics;  Laterality: N/A;   TOTAL KNEE ARTHROPLASTY     bilateral   TOTAL KNEE REVISION Right 03/09/2018   Procedure: RIGHT TOTAL KNEE  REVISION;  Surgeon: AGaynelle Arabian MD;  [facility];  Service: Orthopedics;  Laterality: Right;  1234m with abductor block   VESICOVAGINAL FISTULA CLOSURE W/ TAH  1981   Patient Active Problem List   Diagnosis Date Noted   Chronic pain 01/01/2021   Paresthesia 06/14/2019   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olstrip03/01/2020   Failed total knee arthroplasty (HCGreasy12/07/2017   Aortic atherosclerosis (HCMa[place]on09/07/2017   Hardening of the aorta (main artery of the heart) (HCArroyo Hondo09/07/2017   History of total knee replacement, bilateral 11/04/2017   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HCBerlin10/01/2015   Depression 02/28/2013   GERD (gastroesophageal reflux disease) 02/28/2013   Hiatal hernia with gastroesophageal reflux 02/28/2013   DDD (degenerative disc disease), lumbosacral 06/23/2012   Fibromyalgia syndrome 06/23/2012   Essential hypertension, benign 06/23/2012   Osteopenia 06/23/2012   Diabetes (HCChagrin Falls03/20/2014   Obesity due to excess calories 10/29/2010   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   Personal history of colonic polyps 10/11/2009   REFERRING PROVIDER: FrGaynelle ArabianD  REFERRING DIAG: Aftercare following joint replacement surgery.  THERAPY DIAG:  Chronic  pain of right knee  Stiffness of right knee, not elsewhere classified  Localized edema  Muscle weakness (generalized)  Rationale for Evaluation and Treatment Rehabilitation  ONSET DATE: 09/10/21 (surgery date).  SUBJECTIVE:   SUBJECTIVE STATEMENT:      Pt arrived today with RT knee pain 4-5 /10.       PRECAUTIONS: Other: No ultrasound.    OBJECTIVE: TODAY'S TREATMENT:     12/02/21  Standing at door toe ups with quad set x 15  hold 10 secs LAQ's 2# 3x10 hold 10secs HS curls x30 green tband RT knee Glute isometrics x10 hold 5 secs and sit to stand with glute activation 2 x10 to decrease knee pressure/pain Used  UEs to assist to decrease knee pain and focus on  glutes RT knee tibial IRwith knee flexed at 90 degrees 2x10  [place] to patient's right quadriceps (4 electrodes [place] e'stim 10 sec on f/b 10 sec rest) x 16. minutes for neuro re-education with     X8 mins with QS and heel prop and 8 mins SAQ  MANUAL:  Tibial IR with knee flexion and tibial ER with knee extension. PROM for extension. HS isometrics with Knee fully flexed, Quad isometrics with knee fully extended    Modalities:  Vaso x 10 mins 34 degrees RT knee.   ASSESSMENT:  CLINICAL IMPRESSION:     Pt arrived today doing fairly well with RT knee. Rx focused on ROM and muscle action at flexion end-ranges as well as extension. [place] Estim used for [employer]. Finalize HEP next visit and DC  OBJECTIVE IMPAIRMENTS Abnormal gait, decreased activity tolerance, difficulty walking, decreased ROM, decreased strength, increased edema, and pain.   ACTIVITY LIMITATIONS standing, stairs, and locomotion level  PARTICIPATION LIMITATIONS: meal prep, cleaning, laundry, and yard work  PERSONAL FACTORS 1 comorbidity: multiple right knee surgeries.  are also affecting patient's functional outcome.   REHAB POTENTIAL: Fair    CLINICAL DECISION MAKING: Stable/uncomplicated  EVALUATION COMPLEXITY: Low   GOALS:  LONG TERM GOALS: Target date: 12/30/2021   Independent with an HEP. Baseline:  Goal status: Partially met  2.  Sit to stand x 5  with right knee pain not > 4/10. Baseline:  Goal status: Ongoing      7-8/10 at times 8-24  3.  Walk a community distance with right knee pain not > 4/10. Baseline:  Goal status: Ongoing  due to pain 7-8/10 when first walking then gets less   PLAN: PT FREQUENCY: 2x/week  PT DURATION: 4 weeks  PLANNED INTERVENTIONS: Therapeutic exercises, Therapeutic activity, Neuromuscular re-education, Balance training, Gait training, Patient/Family education, Electrical stimulation, Moist heat, Vasopneumatic device, and Manual therapy  PLAN FOR NEXT  SESSION: 4 electrode co-contract VMS to patient's right quadriceps, no pain increase right LE there ex (O and CKC). Recert for more visits   [place]night [place],CHRIS, PTA 12/02/2021, 11:16 AM   Reporting Period 10/28/21 to 11/27/21  See note below for Objective Data and Assessment of Progress/Goals. Patient progressing with a subjective improvement rating of 20%.   Mali Applegate MPT

## 2021-12-04 ENCOUNTER — Ambulatory Visit: Payer: Medicare Other | Admitting: *Deleted

## 2021-12-04 ENCOUNTER — Encounter: Payer: Self-pay | Admitting: *Deleted

## 2021-12-04 DIAGNOSIS — M25561 Pain in right knee: Secondary | ICD-10-CM | POA: Diagnosis not present

## 2021-12-04 DIAGNOSIS — M25661 Stiffness of right knee, not elsewhere classified: Secondary | ICD-10-CM

## 2021-12-04 DIAGNOSIS — R600 Localized edema: Secondary | ICD-10-CM

## 2021-12-04 DIAGNOSIS — M6281 Muscle weakness (generalized): Secondary | ICD-10-CM | POA: Diagnosis not present

## 2021-12-04 DIAGNOSIS — G8929 Other chronic pain: Secondary | ICD-10-CM | POA: Diagnosis not present

## 2021-12-04 NOTE — Therapy (Signed)
OUTPATIENT PHYSICAL THERAPY LOWER EXTREMITY TREATMENT   Patient Name: [name] MRN: [id number] DOB:1945/10/14, 76 y.o., female Today's Date: 12/04/2021   PT End of Session - 12/04/21 1012     Visit Number 12    Number of Visits 12    Date for PT Re-Evaluation 12/09/21    PT Start Time 0946    PT Stop Time 1035    PT Time Calculation (min) 49 min             Past Medical History:  Diagnosis Date   Allergy    seasonal   Anxiety    Arthritis    [place]    Colon polyps    Complication of anesthesia    per pt, she woke up in the middle of last colon in 2014.   Depression    Diabetes mellitus, type 2 ([place]) 06/2010   Diabetic neuropathy ([place])    Diverticulosis    pt unaware   Esophageal stricture    Fibromyalgia    [place]    GERD (gastroesophageal reflux disease)    Hemorrhoids    Hiatal hernia    Hyperlipidemia    Hypertension    Menopause    Numbness    Peripheral vascular insufficiency (HCC)    Retinopathy    Bilateral   TIA (transient ischemic attack)    Tremor    Vitamin D deficiency    Past Surgical History:  Procedure Laterality Date   ABDOMINAL HYSTERECTOMY     partial and then total   ANKLE SURGERY Right    BACK SURGERY     COLONOSCOPY     ELBOW SURGERY     left   KNEE ARTHROSCOPY Right 09/12/2018   Procedure: ARTHROSCOPY KNEE; SYNOVECTOMY;  Surgeon: Gaynelle Arabian, MD;  [facility];  Service: Orthopedics;  Laterality: Right;  60mn   PATELLA REVISION Right 09/10/2021   Procedure: Resection arthroplasty right patella;  Surgeon: AGaynelle Arabian MD;  [facility];  Service: Orthopedics;  Laterality: Right;   REFRACTIVE SURGERY  2003   SPINAL CORD STIMULATOR INSERTION N/A 01/01/2021   Procedure: SPINAL CORD STIMULATOR PLACEMENT;  Surgeon: BMelina Schools MD;  [facility]  Service: Orthopedics;  Laterality: N/A;   TOTAL KNEE ARTHROPLASTY     bilateral   TOTAL KNEE REVISION Right 03/09/2018   Procedure: RIGHT TOTAL KNEE  REVISION;  Surgeon: AGaynelle Arabian MD;  [facility];  Service: Orthopedics;  Laterality: Right;  1275m with abductor block   VESICOVAGINAL FISTULA CLOSURE W/ TAH  1981   Patient Active Problem List   Diagnosis Date Noted   Chronic pain 01/01/2021   Paresthesia 06/14/2019   Diabetic autonomic neuropathy associated with type 2 diabetes mellitus (HCBloomsbury03/01/2020   Failed total knee arthroplasty (HCRosemount12/07/2017   Aortic atherosclerosis (HCGilman City09/07/2017   Hardening of the aorta (main artery of the heart) (HCMount Blanchard09/07/2017   History of total knee replacement, bilateral 11/04/2017   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HCHelotes10/01/2015   Depression 02/28/2013   GERD (gastroesophageal reflux disease) 02/28/2013   Hiatal hernia with gastroesophageal reflux 02/28/2013   DDD (degenerative disc disease), lumbosacral 06/23/2012   Fibromyalgia syndrome 06/23/2012   Essential hypertension, benign 06/23/2012   Osteopenia 06/23/2012   Diabetes (HCCaruthersville03/20/2014   Obesity due to excess calories 10/29/2010   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   Personal history of colonic polyps 10/11/2009   REFERRING PROVIDER: FrGaynelle ArabianD  REFERRING DIAG: Aftercare following joint replacement surgery.  THERAPY DIAG:  Chronic  pain of right knee  Stiffness of right knee, not elsewhere classified  Localized edema  Muscle weakness (generalized)  Rationale for Evaluation and Treatment Rehabilitation  ONSET DATE: 09/10/21 (surgery date).  SUBJECTIVE:   SUBJECTIVE STATEMENT:      Pt arrived today with RT knee pain 4 /10.       PRECAUTIONS: Other: No ultrasound.    OBJECTIVE: TODAY'S TREATMENT:     12/04/21  Standing at door toe ups with quad set x 15  hold 10 secs  HEP: Glute isometrics x10 hold 5 secs and sit to stand with glute activation 2 x10 to decrease knee pressure/pain Used  UEs to assist to decrease knee pain and focus on glutes and LAQs with3-5#s 3x10 hold 5 secs  RT knee  tibial IRwith knee flexed at 90 degrees 2x10  [place] to patient's right quadriceps (4 electrodes [place] e'stim 10 sec on f/b 10 sec rest) x 13. minutes for neuro re-education with      SAQ 5#s  MANUAL:  Tibial IR with knee flexion x15 and tibial ER in sitting and  with knee extensionx 15 in supine. PROM for extension. HS isometrics with Knee fully flexed, Quad isometrics with knee fully extended    Modalities:  Vaso x 10 mins 34 degrees RT knee.   ASSESSMENT:  CLINICAL IMPRESSION:     Pt arrived today doing fairly well with RT knee and reports pain is less with sit to stand now ,but the first 6-8 steps the pain is 7-8/10 and then decreases the longer she walks. She has progressed with PT and was able to meet 2  LTG's and partially meet 1 due to pain. Pt did well with Rx and will be DC with HEP.      OBJECTIVE IMPAIRMENTS Abnormal gait, decreased activity tolerance, difficulty walking, decreased ROM, decreased strength, increased edema, and pain.   ACTIVITY LIMITATIONS standing, stairs, and locomotion level  PARTICIPATION LIMITATIONS: meal prep, cleaning, laundry, and yard work  PERSONAL FACTORS 1 comorbidity: multiple right knee surgeries.  are also affecting patient's functional outcome.   REHAB POTENTIAL: Fair    CLINICAL DECISION MAKING: Stable/uncomplicated  EVALUATION COMPLEXITY: Low   GOALS:  LONG TERM GOALS: Target date: 01/01/2022   Independent with an HEP. Baseline:  Goal status:  Met  2.  Sit to stand x 5  with right knee pain not > 4/10. Baseline:  Goal status: MET      2/10 8-31  3.  Walk a community distance with right knee pain not > 4/10. Baseline:  Goal status: Partially-MET first 6-8 steps 7-8/10 then decreases   PLAN: PT FREQUENCY: 2x/week  PT DURATION: 4 weeks  PLANNED INTERVENTIONS: Therapeutic exercises, Therapeutic activity, Neuromuscular re-education, Balance training, Gait training, Patient/Family education, Electrical stimulation, Moist  heat, Vasopneumatic device, and Manual therapy  PLAN FOR NEXT SESSION: 4 electrode co-contract VMS to patient's right quadriceps, no pain increase right LE there ex (O and CKC). Recert for more visits   ,CHRIS, PTA 12/04/2021, 1:30 PM   PHYSICAL THERAPY DISCHARGE SUMMARY  Visits from Start of Care: 12.  Current functional level related to goals / functional outcomes: See above.   Remaining deficits: LTG's #1 and 2 met.  LTG #3 partially met.   Education / Equipment: HEP.   Patient agrees to discharge. Patient goals were partially met. Patient is being discharged due to  completed all scheduled PT visits.    

## 2021-12-09 ENCOUNTER — Encounter: Payer: Medicare Other | Admitting: Physical Therapy

## 2021-12-16 ENCOUNTER — Other Ambulatory Visit: Payer: Self-pay | Admitting: Family Medicine

## 2021-12-16 DIAGNOSIS — F320 Major depressive disorder, single episode, mild: Secondary | ICD-10-CM

## 2021-12-16 DIAGNOSIS — Z79899 Other long term (current) drug therapy: Secondary | ICD-10-CM

## 2021-12-23 DIAGNOSIS — H52223 Regular astigmatism, bilateral: Secondary | ICD-10-CM | POA: Diagnosis not present

## 2021-12-23 DIAGNOSIS — H5203 Hypermetropia, bilateral: Secondary | ICD-10-CM | POA: Diagnosis not present

## 2021-12-23 DIAGNOSIS — H25813 Combined forms of age-related cataract, bilateral: Secondary | ICD-10-CM | POA: Diagnosis not present

## 2021-12-23 DIAGNOSIS — H524 Presbyopia: Secondary | ICD-10-CM | POA: Diagnosis not present

## 2021-12-23 LAB — HM DIABETES EYE EXAM

## 2021-12-26 ENCOUNTER — Encounter: Payer: Self-pay | Admitting: Family Medicine

## 2021-12-26 ENCOUNTER — Ambulatory Visit (INDEPENDENT_AMBULATORY_CARE_PROVIDER_SITE_OTHER): Payer: Medicare Other | Admitting: Family Medicine

## 2021-12-26 VITALS — BP 106/64 | HR 87 | Temp 98.0°F | Ht 66.0 in | Wt 201.0 lb

## 2021-12-26 DIAGNOSIS — Z23 Encounter for immunization: Secondary | ICD-10-CM

## 2021-12-26 DIAGNOSIS — F320 Major depressive disorder, single episode, mild: Secondary | ICD-10-CM

## 2021-12-26 DIAGNOSIS — E11610 Type 2 diabetes mellitus with diabetic neuropathic arthropathy: Secondary | ICD-10-CM

## 2021-12-26 DIAGNOSIS — Z79899 Other long term (current) drug therapy: Secondary | ICD-10-CM | POA: Diagnosis not present

## 2021-12-26 DIAGNOSIS — E1143 Type 2 diabetes mellitus with diabetic autonomic (poly)neuropathy: Secondary | ICD-10-CM

## 2021-12-26 DIAGNOSIS — E1142 Type 2 diabetes mellitus with diabetic polyneuropathy: Secondary | ICD-10-CM

## 2021-12-26 DIAGNOSIS — I10 Essential (primary) hypertension: Secondary | ICD-10-CM

## 2021-12-26 LAB — BAYER DCA HB A1C WAIVED: HB A1C (BAYER DCA - WAIVED): 6.5 % — ABNORMAL HIGH (ref 4.8–5.6)

## 2021-12-26 MED ORDER — ALPRAZOLAM 0.5 MG PO TABS: 0.5000 mg | ORAL_TABLET | Freq: Every day | ORAL | 2 refills | Status: DC | PRN

## 2021-12-26 NOTE — Progress Notes (Signed)
BP 106/64   Pulse 87   Temp 98 F (36.7 C)   Ht _0  (1.676 m)   Wt 201 lb (91.2 kg)   BMI 32.44 kg/m    Subjective:   [name], female    DOB: 20-May-1945, 76 y.o.   MRN: [id number]  HPI: [name] is a 76 y.o. female presenting on 12/26/2021 for Medical Management of Chronic Issues, Diabetes, and Anxiety   HPI Type 2 diabetes mellitus Patient comes in today for recheck of his diabetes. Patient has been currently taking glipizide and Ozempic and Synjardy. Patient is currently on an ACE inhibitor/ARB. Patient has seen an ophthalmologist this year. Patient denies any new issues with their feet. The symptom started onset as an adult neuropathy and hypertension and anxiety ARE RELATED TO DM   Hypertension Patient is currently on lisinopril hydrochlorothiazide, and their blood pressure today is 106/64. Patient denies any lightheadedness or dizziness. Patient denies headaches, blurred vision, chest pains, shortness of breath, or weakness. Denies any side effects from medication and is content with current medication.   Anxiety and depression recheck Patient is currently on Abilify and alprazolam Current rx-alprazolam 0.5 mg, 1 to 2 tablets daily as needed for anxiety. # meds rx-30/month Effectiveness of current meds-work Adverse reactions form meds-none  Pill count performed-No Last drug screen -01/15/2021 ( high risk q48m moderate risk q690mlow risk yearly ) Urine drug screen today- Yes [place] if yes were their any concerning findings? - none except she got 1 small prescription of pain medicine recently  No flowsheet data found.   Controlled substance contract signed on: Today  Relevant past medical, surgical, family and social history reviewed and updated as indicated. Interim medical history since our last visit reviewed. Allergies and medications reviewed and updated.  Review of Systems  Constitutional:  Negative for chills and  fever.  Eyes:  Negative for redness and visual disturbance.  Respiratory:  Negative for chest tightness and shortness of breath.   Cardiovascular:  Negative for chest pain and leg swelling.  Genitourinary:  Negative for difficulty urinating and dysuria.  Musculoskeletal:  Positive for back pain. Negative for gait problem.  Skin:  Negative for rash.  Neurological:  Negative for light-headedness and headaches.  Psychiatric/Behavioral:  Negative for agitation and behavioral problems.   All other systems reviewed and are negative.   Per HPI unless specifically indicated above   Allergies as of 12/26/2021       Reactions   Penicillins Other (See Comments)   Blisters in mouth Did it involve swelling of the face/tongue/throat, SOB, or low BP? No Did it involve sudden or severe rash/hives, skin peeling, or any reaction on the inside of your mouth or nose? No Did you need to seek medical attention at a hospital or doctor's office? No When did it last happen?      Childhood allergy If all above answers are "NO", may proceed with cephalosporin use.   Codeine Nausea And Vomiting   Erythromycin Nausea And Vomiting   Fenofibrate Other (See Comments)   aching & edema    Lyrica [pregabalin] Other (See Comments)   Edema in feet   Statins Other (See Comments)   Joints ache   Sulfa Antibiotics Nausea And Vomiting        Medication List        Accurate as of December 26, 2021 10:34 AM. If you have any questions, ask your nurse or doctor.  ALPRAZolam 0.5 MG tablet Commonly known as: XANAX Take 1-2 tablets (0.5-1 mg total) by mouth daily as needed for anxiety. Take 1-2 tabs daily as needed   ARIPiprazole 2 MG tablet Commonly known as: ABILIFY TAKE 1 TABLET DAILY What changed:  how much to take when to take this   Aspercreme Lidocaine 4 % Liqd Generic drug: Lidocaine HCl Apply 1 spray topically at bedtime as needed (foot pain).   atorvastatin 10 MG tablet Commonly  known as: LIPITOR Take 1 tablet (10 mg total) by mouth daily.   Australian Tea Tree 100 % Oil Apply 1 application. topically at bedtime as needed (foot pain).   blood glucose meter kit and supplies Kit Dispense based on patient and insurance preference. Use up to two times daily as directed. (Dx: type 2 DM - E11.9)   clopidogrel 75 MG tablet Commonly known as: PLAVIX Take 1 tablet (75 mg total) by mouth daily.   docusate sodium 100 MG capsule Commonly known as: COLACE Take 1 capsule (100 mg total) by mouth every 12 (twelve) hours. What changed: when to take this   escitalopram 20 MG tablet Commonly known as: LEXAPRO Take 1 tablet (20 mg total) by mouth daily.   ezetimibe 10 MG tablet Commonly known as: ZETIA Take 1 tablet (10 mg total) by mouth daily.   gabapentin 600 MG tablet Commonly known as: NEURONTIN TAKE 1 TABLET EVERY MORNING, NOON EVENING & AT BEDTIME   glipiZIDE 5 MG 24 hr tablet Commonly known as: GLUCOTROL XL TAKE 1 TABLET DAILY WITH BREAKFAST   HYDROcodone-acetaminophen 5-325 MG tablet Commonly known as: NORCO/VICODIN Take 1 tablet by mouth daily as needed (pain).   lisinopril-hydrochlorothiazide 20-25 MG tablet Commonly known as: ZESTORETIC Take 1 tablet by mouth daily.   meclizine 25 MG tablet Commonly known as: ANTIVERT TAKE ONE TABLET THREE TIMES DAILY AS NEEDED FOR DIZZINESS Strength: 25 mg   methocarbamol 500 MG tablet Commonly known as: ROBAXIN Take 1 tablet (500 mg total) by mouth every 6 (six) hours as needed for muscle spasms.   nicotine polacrilex 2 MG gum Commonly known as: NICORETTE Take 2 mg by mouth every 2 (two) hours.   ONE TOUCH ULTRA 2 w/Device Kit CHECK BLOOD SUGAR UP TO TWICE DAILY Dx T41.9   OneTouch Delica Lancets 62I Misc CHECK BLOOD SUGAR UP TO TWICE DAILY Dx E11.9   OneTouch Ultra test strip Generic drug: glucose blood Check BS up to 2 times daily Dx 11.43   Ozempic (1 MG/DOSE) 4 MG/3ML Sopn Generic drug:  Semaglutide (1 MG/DOSE) INJECT 1MG ONCE A WEEK   pantoprazole 40 MG tablet Commonly known as: PROTONIX Take 1 tablet (40 mg total) by mouth daily.   polyethylene glycol 17 g packet Commonly known as: MIRALAX / GLYCOLAX Take 17 g by mouth 2 (two) times daily as needed (constipation).   Premarin vaginal cream Generic drug: conjugated estrogens Place vaginally at bedtime.   Synjardy XR 25-1000 MG Tb24 Generic drug: Empagliflozin-metFORMIN HCl ER Take 1 tablet by mouth daily.   Vitamin D 50 MCG (2000 UT) tablet Take 2,000 Units by mouth every morning.         Objective:   BP 106/64   Pulse 87   Temp 98 F (36.7 C)   Ht _0  (1.676 m)   Wt 201 lb (91.2 kg)   BMI 32.44 kg/m   Wt Readings from Last 3 Encounters:  12/26/21 201 lb (91.2 kg)  09/24/21 203 lb (92.1 kg)  09/10/21 202 lb (  91.6 kg)    Physical Exam Vitals and nursing note reviewed.  Constitutional:      General: She is not in acute distress.    Appearance: She is well-developed. She is not diaphoretic.  Eyes:     Conjunctiva/sclera: Conjunctivae normal.  Cardiovascular:     Rate and Rhythm: Normal rate and regular rhythm.     Heart sounds: Normal heart sounds. No murmur heard. Pulmonary:     Effort: Pulmonary effort is normal. No respiratory distress.     Breath sounds: Normal breath sounds. No wheezing.  Musculoskeletal:        General: No swelling or tenderness. Normal range of motion.  Skin:    General: Skin is warm and dry.     Findings: No rash.  Neurological:     Mental Status: She is alert and oriented to person, place, and time.     Coordination: Coordination normal.  Psychiatric:        Behavior: Behavior normal.       Assessment & Plan:   Problem List Items Addressed This Visit       Cardiovascular and Mediastinum   Essential hypertension, benign (Chronic)     Endocrine   Diabetes ([place]) - Primary (Chronic)   Relevant Orders   CBC with Differential/Platelet   CMP14+EGFR    Lipid panel   Bayer DCA Hb A1c Waived   Microalbumin / creatinine urine ratio   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HCC)   Relevant Medications   ALPRAZolam (XANAX) 0.5 MG tablet   Diabetic autonomic neuropathy associated with type 2 diabetes mellitus ([place])     Other   Depression   Relevant Medications   ALPRAZolam (XANAX) 0.5 MG tablet   Other Visit Diagnoses     Controlled substance agreement signed       Relevant Medications   ALPRAZolam (XANAX) 0.5 MG tablet   Other Relevant Orders   ToxASSURE Select 13 (MW), Urine     A1c looks good at 6.5, no change medication, continue current medicine.  Follow up plan: Return in about 3 months (around 03/27/2022), or if symptoms worsen or fail to improve, for Diabetes recheck and anxiety depression.  Counseling provided for all of the vaccine components Orders Placed This Encounter  Procedures   CBC with Differential/Platelet   CMP14+EGFR   Lipid panel   Bayer DCA Hb A1c Waived   ToxASSURE Select 13 (MW), Urine   Microalbumin / creatinine urine ratio    Caryl Pina, MD [facility] 12/26/2021, 10:34 AM

## 2021-12-27 LAB — CMP14+EGFR
ALT: 16 IU/L (ref 0–32)
AST: 17 IU/L (ref 0–40)
Albumin/Globulin Ratio: 1.6 (ref 1.2–2.2)
Albumin: 3.9 g/dL (ref 3.8–4.8)
Alkaline Phosphatase: 74 IU/L (ref 44–121)
BUN/Creatinine Ratio: 11 — ABNORMAL LOW (ref 12–28)
BUN: 9 mg/dL (ref 8–27)
Bilirubin Total: 0.3 mg/dL (ref 0.0–1.2)
CO2: 22 mmol/L (ref 20–29)
Calcium: 9.4 mg/dL (ref 8.7–10.3)
Chloride: 96 mmol/L (ref 96–106)
Creatinine, Ser: 0.82 mg/dL (ref 0.57–1.00)
Globulin, Total: 2.5 g/dL (ref 1.5–4.5)
Glucose: 172 mg/dL — ABNORMAL HIGH (ref 70–99)
Potassium: 4.6 mmol/L (ref 3.5–5.2)
Sodium: 136 mmol/L (ref 134–144)
Total Protein: 6.4 g/dL (ref 6.0–8.5)
eGFR: 74 mL/min/{1.73_m2} (ref >59)

## 2021-12-27 LAB — CBC WITH DIFFERENTIAL/PLATELET
Basophils Absolute: 0.1 10*3/uL (ref 0.0–0.2)
Basos: 1 %
EOS (ABSOLUTE): 0.3 10*3/uL (ref 0.0–0.4)
Eos: 5 %
Hematocrit: 35.3 % (ref 34.0–46.6)
Hemoglobin: 10.5 g/dL — ABNORMAL LOW (ref 11.1–15.9)
Immature Grans (Abs): 0 10*3/uL (ref 0.0–0.1)
Immature Granulocytes: 0 %
Lymphocytes Absolute: 1.7 10*3/uL (ref 0.7–3.1)
Lymphs: 31 %
MCH: 21.3 pg — ABNORMAL LOW (ref 26.6–33.0)
MCHC: 29.7 g/dL — ABNORMAL LOW (ref 31.5–35.7)
MCV: 72 fL — ABNORMAL LOW (ref 79–97)
Monocytes Absolute: 0.5 10*3/uL (ref 0.1–0.9)
Monocytes: 8 %
Neutrophils Absolute: 3 10*3/uL (ref 1.4–7.0)
Neutrophils: 55 %
Platelets: 277 10*3/uL (ref 150–450)
RBC: 4.93 x10E6/uL (ref 3.77–5.28)
RDW: 16.8 % — ABNORMAL HIGH (ref 11.7–15.4)
WBC: 5.6 10*3/uL (ref 3.4–10.8)

## 2021-12-27 LAB — LIPID PANEL
Chol/HDL Ratio: 3.3 ratio (ref 0.0–4.4)
Cholesterol, Total: 128 mg/dL (ref 100–199)
HDL: 39 mg/dL — ABNORMAL LOW (ref >39)
LDL Chol Calc (NIH): 53 mg/dL (ref 0–99)
Triglycerides: 225 mg/dL — ABNORMAL HIGH (ref 0–149)
VLDL Cholesterol Cal: 36 mg/dL (ref 5–40)

## 2021-12-27 LAB — MICROALBUMIN / CREATININE URINE RATIO
Creatinine, Urine: 57.8 mg/dL
Microalb/Creat Ratio: <5 mg/g creat (ref 0–29)
Microalbumin, Urine: <3 ug/mL

## 2021-12-31 DIAGNOSIS — Z4789 Encounter for other orthopedic aftercare: Secondary | ICD-10-CM | POA: Diagnosis not present

## 2022-01-01 LAB — TOXASSURE SELECT 13 (MW), URINE

## 2022-01-14 ENCOUNTER — Other Ambulatory Visit: Payer: Self-pay | Admitting: Family Medicine

## 2022-01-14 DIAGNOSIS — E1143 Type 2 diabetes mellitus with diabetic autonomic (poly)neuropathy: Secondary | ICD-10-CM

## 2022-01-14 DIAGNOSIS — E11610 Type 2 diabetes mellitus with diabetic neuropathic arthropathy: Secondary | ICD-10-CM

## 2022-01-20 DIAGNOSIS — Z79899 Other long term (current) drug therapy: Secondary | ICD-10-CM | POA: Diagnosis not present

## 2022-01-20 DIAGNOSIS — Z5181 Encounter for therapeutic drug level monitoring: Secondary | ICD-10-CM | POA: Diagnosis not present

## 2022-01-20 DIAGNOSIS — M5136 Other intervertebral disc degeneration, lumbar region: Secondary | ICD-10-CM | POA: Diagnosis not present

## 2022-02-13 ENCOUNTER — Other Ambulatory Visit: Payer: Self-pay | Admitting: Family Medicine

## 2022-02-13 DIAGNOSIS — Z79899 Other long term (current) drug therapy: Secondary | ICD-10-CM

## 2022-02-13 DIAGNOSIS — F320 Major depressive disorder, single episode, mild: Secondary | ICD-10-CM

## 2022-02-13 DIAGNOSIS — E11610 Type 2 diabetes mellitus with diabetic neuropathic arthropathy: Secondary | ICD-10-CM

## 2022-02-13 DIAGNOSIS — E1143 Type 2 diabetes mellitus with diabetic autonomic (poly)neuropathy: Secondary | ICD-10-CM

## 2022-02-16 ENCOUNTER — Other Ambulatory Visit: Payer: Self-pay | Admitting: Family Medicine

## 2022-02-16 DIAGNOSIS — Z780 Asymptomatic menopausal state: Secondary | ICD-10-CM

## 2022-02-20 ENCOUNTER — Other Ambulatory Visit: Payer: Self-pay | Admitting: Sports Medicine

## 2022-02-20 DIAGNOSIS — M5417 Radiculopathy, lumbosacral region: Secondary | ICD-10-CM | POA: Diagnosis not present

## 2022-02-20 DIAGNOSIS — M25561 Pain in right knee: Secondary | ICD-10-CM | POA: Diagnosis not present

## 2022-02-20 DIAGNOSIS — M5451 Vertebrogenic low back pain: Secondary | ICD-10-CM | POA: Diagnosis not present

## 2022-02-23 DIAGNOSIS — M5451 Vertebrogenic low back pain: Secondary | ICD-10-CM | POA: Diagnosis not present

## 2022-02-23 DIAGNOSIS — M5417 Radiculopathy, lumbosacral region: Secondary | ICD-10-CM | POA: Diagnosis not present

## 2022-03-04 DIAGNOSIS — M5417 Radiculopathy, lumbosacral region: Secondary | ICD-10-CM | POA: Diagnosis not present

## 2022-03-04 DIAGNOSIS — M5451 Vertebrogenic low back pain: Secondary | ICD-10-CM | POA: Diagnosis not present

## 2022-03-18 DIAGNOSIS — G8929 Other chronic pain: Secondary | ICD-10-CM | POA: Diagnosis not present

## 2022-03-18 DIAGNOSIS — M5451 Vertebrogenic low back pain: Secondary | ICD-10-CM | POA: Diagnosis not present

## 2022-03-20 ENCOUNTER — Other Ambulatory Visit: Payer: Self-pay | Admitting: Family Medicine

## 2022-03-20 DIAGNOSIS — Z79899 Other long term (current) drug therapy: Secondary | ICD-10-CM

## 2022-03-20 DIAGNOSIS — F320 Major depressive disorder, single episode, mild: Secondary | ICD-10-CM

## 2022-03-27 ENCOUNTER — Encounter: Payer: Self-pay | Admitting: Family Medicine

## 2022-03-27 ENCOUNTER — Ambulatory Visit (INDEPENDENT_AMBULATORY_CARE_PROVIDER_SITE_OTHER): Payer: Medicare Other | Admitting: Family Medicine

## 2022-03-27 ENCOUNTER — Ambulatory Visit (INDEPENDENT_AMBULATORY_CARE_PROVIDER_SITE_OTHER): Payer: Medicare Other

## 2022-03-27 VITALS — BP 124/73 | HR 85 | Temp 97.0°F | Ht 66.0 in | Wt 195.4 lb

## 2022-03-27 DIAGNOSIS — I10 Essential (primary) hypertension: Secondary | ICD-10-CM

## 2022-03-27 DIAGNOSIS — Z79899 Other long term (current) drug therapy: Secondary | ICD-10-CM | POA: Diagnosis not present

## 2022-03-27 DIAGNOSIS — Z780 Asymptomatic menopausal state: Secondary | ICD-10-CM

## 2022-03-27 DIAGNOSIS — F320 Major depressive disorder, single episode, mild: Secondary | ICD-10-CM | POA: Diagnosis not present

## 2022-03-27 DIAGNOSIS — E1142 Type 2 diabetes mellitus with diabetic polyneuropathy: Secondary | ICD-10-CM | POA: Diagnosis not present

## 2022-03-27 LAB — BAYER DCA HB A1C WAIVED: HB A1C (BAYER DCA - WAIVED): 6.9 % — ABNORMAL HIGH (ref 4.8–5.6)

## 2022-03-27 MED ORDER — ALPRAZOLAM 0.5 MG PO TABS: 0.5000 mg | ORAL_TABLET | Freq: Every day | ORAL | 2 refills | Status: DC | PRN

## 2022-03-27 NOTE — Progress Notes (Signed)
BP 124/73   Pulse 85   Temp (!) 97 F (36.1 C) (Temporal)   Ht _0  (1.676 m)   Wt 195 lb 6.4 oz (88.6 kg)   SpO2 95%   BMI 31.54 kg/m    Subjective:   [name], female    DOB: October 15, 1945, 76 y.o.   MRN: [id number]  HPI: [name] is a 76 y.o. female presenting on 03/27/2022 for Diabetes, Anxiety, and Depression (3 month follow up)   HPI Anxiety depression recheck Current rx-Lexapro and Abilify and alprazolam 0.5 mg daily as needed. # meds rx-30/month Effectiveness of current meds-works well, does not use regularly Adverse reactions form meds-none  Pill count performed-No Last drug screen -01/15/2021 ( high risk q14m moderate risk q658mlow risk yearly ) Urine drug screen today- No [place]  If yes were their any concerning findings? -None except for she is also on an opiate but she does not use this medicine regularly  No flowsheet data found.   Controlled substance contract signed on: 01/15/2021  Hypertension Patient is currently on lisinopril hydrochlorothiazide, and their blood pressure today is 124/73. Patient denies any lightheadedness or dizziness. Patient denies headaches, blurred vision, chest pains, shortness of breath, or weakness. Denies any side effects from medication and is content with current medication.   Type 2 diabetes mellitus Patient comes in today for recheck of his diabetes. Patient has been currently taking glipizide and Ozempic and Synjardy. Patient is currently on an ACE inhibitor/ARB. Patient has seen an ophthalmologist this year. Patient denies any new issues with their feet. The symptom started onset as an adult neuropathy and hypertension and atherosclerosis of the aorta ARE RELATED TO DM   Relevant past medical, surgical, family and social history reviewed and updated as indicated. Interim medical history since our last visit reviewed. Allergies and medications reviewed and updated.  Review of  Systems  Constitutional:  Negative for chills and fever.  HENT:  Negative for congestion, ear discharge and ear pain.   Eyes:  Negative for redness and visual disturbance.  Respiratory:  Negative for chest tightness and shortness of breath.   Cardiovascular:  Negative for chest pain and leg swelling.  Genitourinary:  Negative for difficulty urinating and dysuria.  Musculoskeletal:  Negative for back pain and gait problem.  Skin:  Negative for rash.  Neurological:  Negative for dizziness, light-headedness and headaches.  Psychiatric/Behavioral:  Negative for agitation and behavioral problems.   All other systems reviewed and are negative.   Per HPI unless specifically indicated above   Allergies as of 03/27/2022       Reactions   Penicillins Other (See Comments)   Blisters in mouth Did it involve swelling of the face/tongue/throat, SOB, or low BP? No Did it involve sudden or severe rash/hives, skin peeling, or any reaction on the inside of your mouth or nose? No Did you need to seek medical attention at a hospital or doctor's office? No When did it last happen?      Childhood allergy If all above answers are "NO", may proceed with cephalosporin use.   Codeine Nausea And Vomiting   Erythromycin Nausea And Vomiting   Fenofibrate Other (See Comments)   aching & edema    Lyrica [pregabalin] Other (See Comments)   Edema in feet   Statins Other (See Comments)   Joints ache   Sulfa Antibiotics Nausea And Vomiting        Medication List  Accurate as of March 27, 2022  3:07 PM. If you have any questions, ask your nurse or doctor.          ALPRAZolam 0.5 MG tablet Commonly known as: XANAX Take 1-2 tablets (0.5-1 mg total) by mouth daily as needed for anxiety. Take 1-2 tabs daily as needed   ARIPiprazole 2 MG tablet Commonly known as: ABILIFY TAKE 1 TABLET DAILY What changed:  how much to take when to take this   Aspercreme Lidocaine 4 % Liqd Generic drug:  Lidocaine HCl Apply 1 spray topically at bedtime as needed (foot pain).   atorvastatin 10 MG tablet Commonly known as: LIPITOR Take 1 tablet (10 mg total) by mouth daily.   Australian Tea Tree 100 % Oil Apply 1 application. topically at bedtime as needed (foot pain).   blood glucose meter kit and supplies Kit Dispense based on patient and insurance preference. Use up to two times daily as directed. (Dx: type 2 DM - E11.9)   clopidogrel 75 MG tablet Commonly known as: PLAVIX Take 1 tablet (75 mg total) by mouth daily.   docusate sodium 100 MG capsule Commonly known as: COLACE Take 1 capsule (100 mg total) by mouth every 12 (twelve) hours. What changed: when to take this   escitalopram 20 MG tablet Commonly known as: LEXAPRO Take 1 tablet (20 mg total) by mouth daily.   ezetimibe 10 MG tablet Commonly known as: ZETIA Take 1 tablet (10 mg total) by mouth daily.   gabapentin 600 MG tablet Commonly known as: NEURONTIN TAKE ONE TABLET BY MOUTH EVERY MORNING, AT NOON, IN THE EVENING AND AT BEDTIME   glipiZIDE 5 MG 24 hr tablet Commonly known as: GLUCOTROL XL TAKE 1 TABLET DAILY WITH BREAKFAST   HYDROcodone-acetaminophen 5-325 MG tablet Commonly known as: NORCO/VICODIN Take 1 tablet by mouth daily as needed (pain).   lisinopril-hydrochlorothiazide 20-25 MG tablet Commonly known as: ZESTORETIC Take 1 tablet by mouth daily.   meclizine 25 MG tablet Commonly known as: ANTIVERT TAKE ONE TABLET THREE TIMES DAILY AS NEEDED FOR DIZZINESS Strength: 25 mg   methocarbamol 500 MG tablet Commonly known as: ROBAXIN Take 1 tablet (500 mg total) by mouth every 6 (six) hours as needed for muscle spasms.   nicotine polacrilex 2 MG gum Commonly known as: NICORETTE Take 2 mg by mouth every 2 (two) hours.   ONE TOUCH ULTRA 2 w/Device Kit CHECK BLOOD SUGAR UP TO TWICE DAILY Dx V89.3   OneTouch Delica Lancets 81O Misc CHECK BLOOD SUGAR UP TO TWICE DAILY Dx E11.9   OneTouch Ultra  test strip Generic drug: glucose blood Check BS up to 2 times daily Dx 11.43   Ozempic (1 MG/DOSE) 4 MG/3ML Sopn Generic drug: Semaglutide (1 MG/DOSE) INJECT ONE MG ONCE WEEKLY AS DIRECTED   pantoprazole 40 MG tablet Commonly known as: PROTONIX Take 1 tablet (40 mg total) by mouth daily.   polyethylene glycol 17 g packet Commonly known as: MIRALAX / GLYCOLAX Take 17 g by mouth 2 (two) times daily as needed (constipation).   Premarin vaginal cream Generic drug: conjugated estrogens USE VAGINALLY AT BEDTIME AS DIRECTED   Synjardy XR 25-1000 MG Tb24 Generic drug: Empagliflozin-metFORMIN HCl ER Take 1 tablet by mouth daily.   Vitamin D 50 MCG (2000 UT) tablet Take 2,000 Units by mouth every morning.         Objective:   BP 124/73   Pulse 85   Temp (!) 97 F (36.1 C) (Temporal)   Ht 5'  6" (1.676 m)   Wt 195 lb 6.4 oz (88.6 kg)   SpO2 95%   BMI 31.54 kg/m   Wt Readings from Last 3 Encounters:  03/27/22 195 lb 6.4 oz (88.6 kg)  12/26/21 201 lb (91.2 kg)  09/24/21 203 lb (92.1 kg)    Physical Exam Vitals and nursing note reviewed.  Constitutional:      General: She is not in acute distress.    Appearance: She is well-developed. She is not diaphoretic.  Eyes:     Conjunctiva/sclera: Conjunctivae normal.  Cardiovascular:     Rate and Rhythm: Normal rate and regular rhythm.     Heart sounds: Normal heart sounds. No murmur heard. Pulmonary:     Effort: Pulmonary effort is normal. No respiratory distress.     Breath sounds: Normal breath sounds. No wheezing.  Musculoskeletal:        General: No tenderness. Normal range of motion.  Skin:    General: Skin is warm and dry.     Findings: No rash.  Neurological:     Mental Status: She is alert and oriented to person, place, and time.     Coordination: Coordination normal.  Psychiatric:        Behavior: Behavior normal.       Assessment & Plan:   Problem List Items Addressed This Visit        Cardiovascular and Mediastinum   Essential hypertension, benign (Chronic)   Relevant Orders   Lipid panel   CBC with Differential/Platelet   CMP14+EGFR     Endocrine   Diabetes ([place]) - Primary (Chronic)   Relevant Orders   Bayer DCA Hb A1c Waived     Other   Depression   Relevant Medications   ALPRAZolam (XANAX) 0.5 MG tablet   Other Visit Diagnoses     Controlled substance agreement signed       Relevant Medications   ALPRAZolam (XANAX) 0.5 MG tablet   Postmenopausal       Relevant Orders   DG [facility] DEXA       A1c looks decent at 6.9.  No changes Follow up plan: Return in about 3 months (around 06/26/2022), or if symptoms worsen or fail to improve, for Diabetes and hypertension.  Counseling provided for all of the vaccine components Orders Placed This Encounter  Procedures   DG [facility] DEXA   Lipid panel   CBC with Differential/Platelet   CMP14+EGFR   Bayer DCA Hb A1c Waived    Caryl Pina, MD [facility] 03/27/2022, 3:07 PM

## 2022-03-28 LAB — LIPID PANEL
Chol/HDL Ratio: 3.7 ratio (ref 0.0–4.4)
Cholesterol, Total: 148 mg/dL (ref 100–199)
HDL: 40 mg/dL (ref >39)
LDL Chol Calc (NIH): 69 mg/dL (ref 0–99)
Triglycerides: 237 mg/dL — ABNORMAL HIGH (ref 0–149)
VLDL Cholesterol Cal: 39 mg/dL (ref 5–40)

## 2022-03-28 LAB — CMP14+EGFR
ALT: 18 IU/L (ref 0–32)
AST: 19 IU/L (ref 0–40)
Albumin/Globulin Ratio: 1.6 (ref 1.2–2.2)
Albumin: 4 g/dL (ref 3.8–4.8)
Alkaline Phosphatase: 80 IU/L (ref 44–121)
BUN/Creatinine Ratio: 11 — ABNORMAL LOW (ref 12–28)
BUN: 9 mg/dL (ref 8–27)
Bilirubin Total: <0.2 mg/dL (ref 0.0–1.2)
CO2: 24 mmol/L (ref 20–29)
Calcium: 9.6 mg/dL (ref 8.7–10.3)
Chloride: 97 mmol/L (ref 96–106)
Creatinine, Ser: 0.85 mg/dL (ref 0.57–1.00)
Globulin, Total: 2.5 g/dL (ref 1.5–4.5)
Glucose: 68 mg/dL — ABNORMAL LOW (ref 70–99)
Potassium: 4.4 mmol/L (ref 3.5–5.2)
Sodium: 139 mmol/L (ref 134–144)
Total Protein: 6.5 g/dL (ref 6.0–8.5)
eGFR: 71 mL/min/{1.73_m2} (ref >59)

## 2022-03-28 LAB — CBC WITH DIFFERENTIAL/PLATELET
Basophils Absolute: 0.1 10*3/uL (ref 0.0–0.2)
Basos: 1 %
EOS (ABSOLUTE): 0.3 10*3/uL (ref 0.0–0.4)
Eos: 4 %
Hematocrit: 39.9 % (ref 34.0–46.6)
Hemoglobin: 12.5 g/dL (ref 11.1–15.9)
Immature Grans (Abs): 0 10*3/uL (ref 0.0–0.1)
Immature Granulocytes: 0 %
Lymphocytes Absolute: 2.8 10*3/uL (ref 0.7–3.1)
Lymphs: 34 %
MCH: 24.4 pg — ABNORMAL LOW (ref 26.6–33.0)
MCHC: 31.3 g/dL — ABNORMAL LOW (ref 31.5–35.7)
MCV: 78 fL — ABNORMAL LOW (ref 79–97)
Monocytes Absolute: 0.9 10*3/uL (ref 0.1–0.9)
Monocytes: 12 %
Neutrophils Absolute: 4 10*3/uL (ref 1.4–7.0)
Neutrophils: 49 %
Platelets: 275 10*3/uL (ref 150–450)
RBC: 5.13 x10E6/uL (ref 3.77–5.28)
RDW: 20.4 % — ABNORMAL HIGH (ref 11.7–15.4)
WBC: 8.1 10*3/uL (ref 3.4–10.8)

## 2022-03-31 DIAGNOSIS — M85852 Other specified disorders of bone density and structure, left thigh: Secondary | ICD-10-CM | POA: Diagnosis not present

## 2022-03-31 DIAGNOSIS — M85851 Other specified disorders of bone density and structure, right thigh: Secondary | ICD-10-CM | POA: Diagnosis not present

## 2022-04-08 DIAGNOSIS — M5451 Vertebrogenic low back pain: Secondary | ICD-10-CM | POA: Diagnosis not present

## 2022-04-08 DIAGNOSIS — M5417 Radiculopathy, lumbosacral region: Secondary | ICD-10-CM | POA: Diagnosis not present

## 2022-04-15 ENCOUNTER — Other Ambulatory Visit: Payer: Self-pay | Admitting: Family Medicine

## 2022-04-15 DIAGNOSIS — E11610 Type 2 diabetes mellitus with diabetic neuropathic arthropathy: Secondary | ICD-10-CM

## 2022-04-15 DIAGNOSIS — E1143 Type 2 diabetes mellitus with diabetic autonomic (poly)neuropathy: Secondary | ICD-10-CM

## 2022-04-15 DIAGNOSIS — M5451 Vertebrogenic low back pain: Secondary | ICD-10-CM | POA: Diagnosis not present

## 2022-04-15 DIAGNOSIS — M5417 Radiculopathy, lumbosacral region: Secondary | ICD-10-CM | POA: Diagnosis not present

## 2022-04-20 ENCOUNTER — Telehealth: Payer: Self-pay | Admitting: Family Medicine

## 2022-04-20 NOTE — Telephone Encounter (Signed)
Patient calling because she has called around to different pharmacies and none of them have [place]. She would like to know if we have samples or if something else can be called in instead.

## 2022-04-20 NOTE — Telephone Encounter (Signed)
Left detailed message no samples at this time.

## 2022-04-22 DIAGNOSIS — M5451 Vertebrogenic low back pain: Secondary | ICD-10-CM | POA: Diagnosis not present

## 2022-04-22 DIAGNOSIS — M5417 Radiculopathy, lumbosacral region: Secondary | ICD-10-CM | POA: Diagnosis not present

## 2022-04-23 DIAGNOSIS — Z96651 Presence of right artificial knee joint: Secondary | ICD-10-CM | POA: Diagnosis not present

## 2022-04-23 DIAGNOSIS — Z4789 Encounter for other orthopedic aftercare: Secondary | ICD-10-CM | POA: Diagnosis not present

## 2022-04-29 DIAGNOSIS — M5417 Radiculopathy, lumbosacral region: Secondary | ICD-10-CM | POA: Diagnosis not present

## 2022-04-29 DIAGNOSIS — M5451 Vertebrogenic low back pain: Secondary | ICD-10-CM | POA: Diagnosis not present

## 2022-05-06 DIAGNOSIS — M5417 Radiculopathy, lumbosacral region: Secondary | ICD-10-CM | POA: Diagnosis not present

## 2022-05-06 DIAGNOSIS — M5451 Vertebrogenic low back pain: Secondary | ICD-10-CM | POA: Diagnosis not present

## 2022-05-13 DIAGNOSIS — M5417 Radiculopathy, lumbosacral region: Secondary | ICD-10-CM | POA: Diagnosis not present

## 2022-05-13 DIAGNOSIS — M5451 Vertebrogenic low back pain: Secondary | ICD-10-CM | POA: Diagnosis not present

## 2022-05-20 DIAGNOSIS — M5417 Radiculopathy, lumbosacral region: Secondary | ICD-10-CM | POA: Diagnosis not present

## 2022-05-20 DIAGNOSIS — M5451 Vertebrogenic low back pain: Secondary | ICD-10-CM | POA: Diagnosis not present

## 2022-05-21 DIAGNOSIS — Z96651 Presence of right artificial knee joint: Secondary | ICD-10-CM | POA: Diagnosis not present

## 2022-05-21 DIAGNOSIS — M48062 Spinal stenosis, lumbar region with neurogenic claudication: Secondary | ICD-10-CM | POA: Diagnosis not present

## 2022-05-21 DIAGNOSIS — Z79891 Long term (current) use of opiate analgesic: Secondary | ICD-10-CM | POA: Diagnosis not present

## 2022-05-21 DIAGNOSIS — M546 Pain in thoracic spine: Secondary | ICD-10-CM | POA: Diagnosis not present

## 2022-05-21 DIAGNOSIS — M5136 Other intervertebral disc degeneration, lumbar region: Secondary | ICD-10-CM | POA: Diagnosis not present

## 2022-06-11 DIAGNOSIS — Z96651 Presence of right artificial knee joint: Secondary | ICD-10-CM | POA: Diagnosis not present

## 2022-06-11 DIAGNOSIS — Z471 Aftercare following joint replacement surgery: Secondary | ICD-10-CM | POA: Diagnosis not present

## 2022-06-16 ENCOUNTER — Other Ambulatory Visit: Payer: Self-pay | Admitting: Family Medicine

## 2022-06-16 DIAGNOSIS — E1143 Type 2 diabetes mellitus with diabetic autonomic (poly)neuropathy: Secondary | ICD-10-CM

## 2022-06-16 DIAGNOSIS — E11610 Type 2 diabetes mellitus with diabetic neuropathic arthropathy: Secondary | ICD-10-CM

## 2022-06-24 ENCOUNTER — Other Ambulatory Visit: Payer: Self-pay | Admitting: Family Medicine

## 2022-06-24 DIAGNOSIS — H04123 Dry eye syndrome of bilateral lacrimal glands: Secondary | ICD-10-CM | POA: Diagnosis not present

## 2022-06-24 DIAGNOSIS — Z79899 Other long term (current) drug therapy: Secondary | ICD-10-CM

## 2022-06-24 DIAGNOSIS — H524 Presbyopia: Secondary | ICD-10-CM | POA: Diagnosis not present

## 2022-06-24 DIAGNOSIS — F320 Major depressive disorder, single episode, mild: Secondary | ICD-10-CM

## 2022-06-25 ENCOUNTER — Telehealth: Payer: Self-pay | Admitting: Family Medicine

## 2022-06-25 NOTE — Telephone Encounter (Signed)
Spoke with patient, blood sugar this morning was 187, yesterday it was low but she cannot remember exactly what it was.  She has not felt well the last  couple of days and would like to be seen.  Appointment scheduled 06/26/22 with Dr. Warrick Parisian.

## 2022-06-26 ENCOUNTER — Encounter: Payer: Self-pay | Admitting: Family Medicine

## 2022-06-26 ENCOUNTER — Ambulatory Visit (INDEPENDENT_AMBULATORY_CARE_PROVIDER_SITE_OTHER): Payer: Medicare Other | Admitting: Family Medicine

## 2022-06-26 VITALS — BP 141/82 | HR 98 | Ht 66.0 in | Wt 194.0 lb

## 2022-06-26 DIAGNOSIS — F320 Major depressive disorder, single episode, mild: Secondary | ICD-10-CM

## 2022-06-26 DIAGNOSIS — Z79899 Other long term (current) drug therapy: Secondary | ICD-10-CM

## 2022-06-26 DIAGNOSIS — I10 Essential (primary) hypertension: Secondary | ICD-10-CM | POA: Diagnosis not present

## 2022-06-26 DIAGNOSIS — E1142 Type 2 diabetes mellitus with diabetic polyneuropathy: Secondary | ICD-10-CM

## 2022-06-26 DIAGNOSIS — E1143 Type 2 diabetes mellitus with diabetic autonomic (poly)neuropathy: Secondary | ICD-10-CM | POA: Diagnosis not present

## 2022-06-26 DIAGNOSIS — E11610 Type 2 diabetes mellitus with diabetic neuropathic arthropathy: Secondary | ICD-10-CM

## 2022-06-26 LAB — BMP8+EGFR
BUN/Creatinine Ratio: 13 (ref 12–28)
BUN: 10 mg/dL (ref 8–27)
CO2: 20 mmol/L (ref 20–29)
Calcium: 9.9 mg/dL (ref 8.7–10.3)
Chloride: 99 mmol/L (ref 96–106)
Creatinine, Ser: 0.79 mg/dL (ref 0.57–1.00)
Glucose: 145 mg/dL — ABNORMAL HIGH (ref 70–99)
Potassium: 4.3 mmol/L (ref 3.5–5.2)
Sodium: 138 mmol/L (ref 134–144)
eGFR: 77 mL/min/{1.73_m2} (ref >59)

## 2022-06-26 LAB — BAYER DCA HB A1C WAIVED: HB A1C (BAYER DCA - WAIVED): 6.9 % — ABNORMAL HIGH (ref 4.8–5.6)

## 2022-06-26 MED ORDER — OZEMPIC (1 MG/DOSE) 4 MG/3ML ~~LOC~~ SOPN: 1.0000 mg | PEN_INJECTOR | SUBCUTANEOUS | 3 refills | Status: DC

## 2022-06-26 MED ORDER — SYNJARDY XR 25-1000 MG PO TB24: 1.0000 | ORAL_TABLET | Freq: Every day | ORAL | 3 refills | Status: DC

## 2022-06-26 MED ORDER — ALPRAZOLAM 0.5 MG PO TABS: 0.5000 mg | ORAL_TABLET | Freq: Every day | ORAL | 2 refills | Status: DC | PRN

## 2022-06-26 MED ORDER — GLIPIZIDE ER 5 MG PO TB24: 5.0000 mg | ORAL_TABLET | Freq: Every day | ORAL | 3 refills | Status: DC

## 2022-06-26 NOTE — Progress Notes (Signed)
BP (!) 141/82   Pulse 98   Ht 5\' 6"  (1.676 m)   Wt 194 lb (88 kg)   SpO2 95%   BMI 31.31 kg/m    Subjective:   [name], female    DOB: September 06, 1945, 77 y.o.   MRN: [id number]  HPI: [name] is a 77 y.o. female presenting on 06/26/2022 for Medical Management of Chronic Issues, Diabetes, and Pain (Back, neuropathy feet)   HPI Anxiety and fibromyalgia and neuropathy Current rx-alprazolam and Abilify.  She takes alprazolam 0.5 mg nightly 1 to 2 tablets each night as needed # meds rx-30/month Effectiveness of current meds-works well, she does not use it every night Adverse reactions form meds-none  Pill count performed-No Last drug screen -01/15/2021 ( high risk q11m, moderate risk q70m, low risk yearly ) Urine drug screen today- No Was the [place] reviewed-yes  If yes were their any concerning findings? -None  No flowsheet data found.   Controlled substance contract signed on: 01/15/2021  Type 2 diabetes mellitus Patient comes in today for recheck of his diabetes. Patient has been currently taking glipizide and Ozempic and Synjardy. Patient is currently on an ACE inhibitor/ARB. Patient has not seen an ophthalmologist this year. Patient denies any issues with their feet. The symptom started onset as an adult hypertension and hyperlipidemia and atherosclerosis of the aorta ARE RELATED TO DM   Hypertension Patient is currently on lisinopril hydrochlorothiazide, and their blood pressure today is 141/82. Patient denies any lightheadedness or dizziness. Patient denies headaches, blurred vision, chest pains, shortness of breath, or weakness. Denies any side effects from medication and is content with current medication.  Patient is coming in today with complaints of feeling jittery and shaky and dizzy and not feeling well and has been going on for the past 3 or 4 days.  She does admit that she was having some dizziness before and she stopped her gabapentin because of  the side effect of dizziness and stopped it completely cold [place] without tapering and she has been feeling this way since she stopped it over the past 3 to 4 days.  She stopped it just a little under a week ago.  Relevant past medical, surgical, family and social history reviewed and updated as indicated. Interim medical history since our last visit reviewed. Allergies and medications reviewed and updated.  Review of Systems  Constitutional:  Negative for chills and fever.  HENT:  Negative for congestion, ear discharge and ear pain.   Eyes:  Negative for redness and visual disturbance.  Respiratory:  Negative for chest tightness and shortness of breath.   Cardiovascular:  Negative for chest pain and leg swelling.  Genitourinary:  Negative for difficulty urinating and dysuria.  Musculoskeletal:  Negative for back pain and gait problem.  Skin:  Negative for rash.  Neurological:  Positive for dizziness and tremors. Negative for weakness, light-headedness and headaches.  Psychiatric/Behavioral:  Negative for agitation and behavioral problems.   All other systems reviewed and are negative.   Per HPI unless specifically indicated above   Allergies as of 06/26/2022       Reactions   Penicillins Other (See Comments)   Blisters in mouth Did it involve swelling of the face/tongue/throat, SOB, or low BP? No Did it involve sudden or severe rash/hives, skin peeling, or any reaction on the inside of your mouth or nose? No Did you need to seek medical attention at a hospital or doctor's office? No When did it  last happen?      Childhood allergy If all above answers are "NO", may proceed with cephalosporin use.   Codeine Nausea And Vomiting   Erythromycin Nausea And Vomiting   Fenofibrate Other (See Comments)   aching & edema    Lyrica [pregabalin] Other (See Comments)   Edema in feet   Statins Other (See Comments)   Joints ache   Sulfa Antibiotics Nausea And Vomiting        Medication  List        Accurate as of June 26, 2022  9:52 AM. If you have any questions, ask your nurse or doctor.          STOP taking these medications    gabapentin 600 MG tablet Commonly known as: NEURONTIN Stopped by: Fransisca Kaufmann Althia Egolf, MD       TAKE these medications    ALPRAZolam 0.5 MG tablet Commonly known as: XANAX Take 1-2 tablets (0.5-1 mg total) by mouth daily as needed for anxiety. Take 1-2 tabs daily as needed   ARIPiprazole 2 MG tablet Commonly known as: ABILIFY TAKE 1 TABLET DAILY What changed:  how much to take when to take this   Aspercreme Lidocaine 4 % Liqd Generic drug: Lidocaine HCl Apply 1 spray topically at bedtime as needed (foot pain).   atorvastatin 10 MG tablet Commonly known as: LIPITOR Take 1 tablet (10 mg total) by mouth daily.   Australian Tea Tree 100 % Oil Apply 1 application. topically at bedtime as needed (foot pain).   blood glucose meter kit and supplies Kit Dispense based on patient and insurance preference. Use up to two times daily as directed. (Dx: type 2 DM - E11.9)   clopidogrel 75 MG tablet Commonly known as: PLAVIX Take 1 tablet (75 mg total) by mouth daily.   docusate sodium 100 MG capsule Commonly known as: COLACE Take 1 capsule (100 mg total) by mouth every 12 (twelve) hours. What changed: when to take this   escitalopram 20 MG tablet Commonly known as: LEXAPRO Take 1 tablet (20 mg total) by mouth daily.   ezetimibe 10 MG tablet Commonly known as: ZETIA Take 1 tablet (10 mg total) by mouth daily.   glipiZIDE 5 MG 24 hr tablet Commonly known as: GLUCOTROL XL Take 1 tablet (5 mg total) by mouth daily with breakfast. What changed: See the new instructions. Changed by: Worthy Rancher, MD   HYDROcodone-acetaminophen 5-325 MG tablet Commonly known as: NORCO/VICODIN Take 1 tablet by mouth daily as needed (pain).   lisinopril-hydrochlorothiazide 20-25 MG tablet Commonly known as: ZESTORETIC Take 1 tablet  by mouth daily.   meclizine 25 MG tablet Commonly known as: ANTIVERT TAKE ONE TABLET THREE TIMES DAILY AS NEEDED FOR DIZZINESS Strength: 25 mg   methocarbamol 500 MG tablet Commonly known as: ROBAXIN Take 1 tablet (500 mg total) by mouth every 6 (six) hours as needed for muscle spasms.   nicotine polacrilex 2 MG gum Commonly known as: NICORETTE Take 2 mg by mouth every 2 (two) hours.   ONE TOUCH ULTRA 2 w/Device Kit CHECK BLOOD SUGAR UP TO TWICE DAILY Dx XX123456   OneTouch Delica Lancets 99991111 Misc CHECK BLOOD SUGAR UP TO TWICE DAILY Dx E11.9   OneTouch Ultra test strip Generic drug: glucose blood Check BS up to 2 times daily Dx 11.43   Ozempic (1 MG/DOSE) 4 MG/3ML Sopn Generic drug: Semaglutide (1 MG/DOSE) Inject 1 mg into the skin once a week. What changed: See the new instructions. Changed  by: Worthy Rancher, MD   pantoprazole 40 MG tablet Commonly known as: PROTONIX Take 1 tablet (40 mg total) by mouth daily.   polyethylene glycol 17 g packet Commonly known as: MIRALAX / GLYCOLAX Take 17 g by mouth 2 (two) times daily as needed (constipation).   Premarin vaginal cream Generic drug: conjugated estrogens USE VAGINALLY AT BEDTIME AS DIRECTED   Synjardy XR 25-1000 MG Tb24 Generic drug: Empagliflozin-metFORMIN HCl ER Take 1 tablet by mouth daily.   Vitamin D 50 MCG (2000 UT) tablet Take 2,000 Units by mouth every morning.         Objective:   BP (!) 141/82   Pulse 98   Ht 5\' 6"  (1.676 m)   Wt 194 lb (88 kg)   SpO2 95%   BMI 31.31 kg/m   Wt Readings from Last 3 Encounters:  06/26/22 194 lb (88 kg)  03/27/22 195 lb 6.4 oz (88.6 kg)  12/26/21 201 lb (91.2 kg)    Physical Exam Vitals and nursing note reviewed.  Constitutional:      General: She is not in acute distress.    Appearance: She is well-developed. She is not diaphoretic.  Eyes:     Conjunctiva/sclera: Conjunctivae normal.  Cardiovascular:     Rate and Rhythm: Normal rate and regular  rhythm.     Heart sounds: Normal heart sounds. No murmur heard. Pulmonary:     Effort: Pulmonary effort is normal. No respiratory distress.     Breath sounds: Normal breath sounds. No wheezing.  Musculoskeletal:        General: No swelling or tenderness. Normal range of motion.  Skin:    General: Skin is warm and dry.     Findings: No rash.  Neurological:     Mental Status: She is alert and oriented to person, place, and time.     Coordination: Coordination normal.  Psychiatric:        Behavior: Behavior normal.       Assessment & Plan:   Problem List Items Addressed This Visit       Cardiovascular and Mediastinum   Essential hypertension, benign (Chronic)     Endocrine   Diabetes ([place]) - Primary (Chronic)   Relevant Medications   Empagliflozin-metFORMIN HCl ER (SYNJARDY XR) 25-1000 MG TB24   glipiZIDE (GLUCOTROL XL) 5 MG 24 hr tablet   Semaglutide, 1 MG/DOSE, (OZEMPIC, 1 MG/DOSE,) 4 MG/3ML SOPN   Other Relevant Orders   BMP8+EGFR   Bayer DCA Hb A1c Waived   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HCC)   Relevant Medications   ALPRAZolam (XANAX) 0.5 MG tablet   Empagliflozin-metFORMIN HCl ER (SYNJARDY XR) 25-1000 MG TB24   glipiZIDE (GLUCOTROL XL) 5 MG 24 hr tablet   Semaglutide, 1 MG/DOSE, (OZEMPIC, 1 MG/DOSE,) 4 MG/3ML SOPN   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  She says currently she was taking 600 mg daily and I told her to take the 300 mg  daily for 2 weeks and then go down  to 300 mg every other day for a week and then come off of it.  Follow up plan: Return in about 3 months (around 09/26/2022), or if symptoms worsen or fail to improve, for Diabetes and neuropathy and anxiety.  Counseling provided for all of the vaccine components Orders Placed This Encounter  Procedures   BMP8+EGFR   Bayer DCA Hb A1c [place], MD [facility] 06/26/2022, 9:52 AM

## 2022-06-29 ENCOUNTER — Ambulatory Visit (INDEPENDENT_AMBULATORY_CARE_PROVIDER_SITE_OTHER): Payer: Medicare Other | Admitting: Family Medicine

## 2022-06-29 ENCOUNTER — Encounter: Payer: Self-pay | Admitting: Family Medicine

## 2022-06-29 VITALS — BP 114/66 | HR 96 | Ht 66.0 in | Wt 194.0 lb

## 2022-06-29 DIAGNOSIS — Z7985 Long-term (current) use of injectable non-insulin antidiabetic drugs: Secondary | ICD-10-CM | POA: Diagnosis not present

## 2022-06-29 DIAGNOSIS — Z79899 Other long term (current) drug therapy: Secondary | ICD-10-CM | POA: Diagnosis not present

## 2022-06-29 DIAGNOSIS — E11610 Type 2 diabetes mellitus with diabetic neuropathic arthropathy: Secondary | ICD-10-CM

## 2022-06-29 DIAGNOSIS — F320 Major depressive disorder, single episode, mild: Secondary | ICD-10-CM | POA: Diagnosis not present

## 2022-06-29 MED ORDER — ALPRAZOLAM 0.5 MG PO TABS: 0.5000 mg | ORAL_TABLET | Freq: Every day | ORAL | 2 refills | Status: DC | PRN

## 2022-06-29 NOTE — Progress Notes (Signed)
BP 114/66   Pulse 96   Ht 5\' 6"  (1.676 m)   Wt 194 lb (88 kg)   SpO2 97%   BMI 31.31 kg/m    Subjective:   [name], female    DOB: 03-Jun-1945, 77 y.o.   MRN: [id number]  HPI: [name] is a 77 y.o. female presenting on 06/29/2022 for Medical Management of Chronic Issues, Diabetes, degenerative disc disease, and Anxiety   HPI Diabetic neuropathy and anxiety Patient had stopped her gabapentin cold [place] and now we had restarted at a lower dose and gave her some tapering instructions how to get off it appropriately and she is says she is feeling a lot better even in the past few days and doing better.  Her anxiety is doing better and her tremors and shakiness is doing better she still has neuropathy but she is trying some [place] oil in the meantime and then she will let us know if we need something in the future.  Relevant past medical, surgical, family and social history reviewed and updated as indicated. Interim medical history since our last visit reviewed. Allergies and medications reviewed and updated.  Review of Systems  Constitutional:  Negative for chills and fever.  Eyes:  Negative for visual disturbance.  Respiratory:  Negative for chest tightness and shortness of breath.   Cardiovascular:  Negative for chest pain and leg swelling.  Musculoskeletal:  Negative for back pain and gait problem.  Skin:  Negative for rash.  Neurological:  Positive for tremors. Negative for dizziness, light-headedness and headaches.  Psychiatric/Behavioral:  Negative for agitation and behavioral problems.   All other systems reviewed and are negative.   Per HPI unless specifically indicated above   Allergies as of 06/29/2022       Reactions   Penicillins Other (See Comments)   Blisters in mouth Did it involve swelling of the face/tongue/throat, SOB, or low BP? No Did it involve sudden or severe rash/hives, skin peeling, or any reaction on the inside of your mouth  or nose? No Did you need to seek medical attention at a hospital or doctor's office? No When did it last happen?      Childhood allergy If all above answers are "NO", may proceed with cephalosporin use.   Codeine Nausea And Vomiting   Erythromycin Nausea And Vomiting   Fenofibrate Other (See Comments)   aching & edema    Lyrica [pregabalin] Other (See Comments)   Edema in feet   Statins Other (See Comments)   Joints ache   Sulfa Antibiotics Nausea And Vomiting        Medication List        Accurate as of June 29, 2022 11:52 AM. If you have any questions, ask your nurse or doctor.          ALPRAZolam 0.5 MG tablet Commonly known as: XANAX Take 1-2 tablets (0.5-1 mg total) by mouth daily as needed for anxiety. Take 1-2 tabs daily as needed   ARIPiprazole 2 MG tablet Commonly known as: ABILIFY TAKE 1 TABLET DAILY What changed:  how much to take when to take this   Aspercreme Lidocaine 4 % Liqd Generic drug: Lidocaine HCl Apply 1 spray topically at bedtime as needed (foot pain).   atorvastatin 10 MG tablet Commonly known as: LIPITOR Take 1 tablet (10 mg total) by mouth daily.   Australian Tea Tree 100 % Oil Apply 1 application. topically at bedtime as needed (foot pain).  blood glucose meter kit and supplies Kit Dispense based on patient and insurance preference. Use up to two times daily as directed. (Dx: type 2 DM - E11.9)   clopidogrel 75 MG tablet Commonly known as: PLAVIX Take 1 tablet (75 mg total) by mouth daily.   docusate sodium 100 MG capsule Commonly known as: COLACE Take 1 capsule (100 mg total) by mouth every 12 (twelve) hours. What changed: when to take this   escitalopram 20 MG tablet Commonly known as: LEXAPRO Take 1 tablet (20 mg total) by mouth daily.   ezetimibe 10 MG tablet Commonly known as: ZETIA Take 1 tablet (10 mg total) by mouth daily.   glipiZIDE 5 MG 24 hr tablet Commonly known as: GLUCOTROL XL Take 1 tablet (5 mg total)  by mouth daily with breakfast.   HYDROcodone-acetaminophen 5-325 MG tablet Commonly known as: NORCO/VICODIN Take 1 tablet by mouth daily as needed (pain).   lisinopril-hydrochlorothiazide 20-25 MG tablet Commonly known as: ZESTORETIC Take 1 tablet by mouth daily.   meclizine 25 MG tablet Commonly known as: ANTIVERT TAKE ONE TABLET THREE TIMES DAILY AS NEEDED FOR DIZZINESS Strength: 25 mg   methocarbamol 500 MG tablet Commonly known as: ROBAXIN Take 1 tablet (500 mg total) by mouth every 6 (six) hours as needed for muscle spasms.   nicotine polacrilex 2 MG gum Commonly known as: NICORETTE Take 2 mg by mouth every 2 (two) hours.   ONE TOUCH ULTRA 2 w/Device Kit CHECK BLOOD SUGAR UP TO TWICE DAILY Dx XX123456   OneTouch Delica Lancets 99991111 Misc CHECK BLOOD SUGAR UP TO TWICE DAILY Dx E11.9   OneTouch Ultra test strip Generic drug: glucose blood Check BS up to 2 times daily Dx 11.43   Ozempic (1 MG/DOSE) 4 MG/3ML Sopn Generic drug: Semaglutide (1 MG/DOSE) Inject 1 mg into the skin once a week.   pantoprazole 40 MG tablet Commonly known as: PROTONIX Take 1 tablet (40 mg total) by mouth daily.   polyethylene glycol 17 g packet Commonly known as: MIRALAX / GLYCOLAX Take 17 g by mouth 2 (two) times daily as needed (constipation).   Premarin vaginal cream Generic drug: conjugated estrogens USE VAGINALLY AT BEDTIME AS DIRECTED   Synjardy XR 25-1000 MG Tb24 Generic drug: Empagliflozin-metFORMIN HCl ER Take 1 tablet by mouth daily.   Vitamin D 50 MCG (2000 UT) tablet Take 2,000 Units by mouth every morning.         Objective:   BP 114/66   Pulse 96   Ht 5\' 6"  (1.676 m)   Wt 194 lb (88 kg)   SpO2 97%   BMI 31.31 kg/m   Wt Readings from Last 3 Encounters:  06/29/22 194 lb (88 kg)  06/26/22 194 lb (88 kg)  03/27/22 195 lb 6.4 oz (88.6 kg)    Physical Exam Vitals and nursing note reviewed.  Constitutional:      General: She is not in acute distress.     Appearance: She is well-developed. She is not diaphoretic.  Eyes:     Conjunctiva/sclera: Conjunctivae normal.  Cardiovascular:     Rate and Rhythm: Normal rate and regular rhythm.     Heart sounds: Normal heart sounds. No murmur heard. Pulmonary:     Effort: Pulmonary effort is normal. No respiratory distress.     Breath sounds: Normal breath sounds. No wheezing.  Musculoskeletal:        General: No swelling or tenderness. Normal range of motion.  Skin:    General: Skin is  warm and dry.     Findings: No rash.  Neurological:     Mental Status: She is alert and oriented to person, place, and time.     Coordination: Coordination normal.  Psychiatric:        Behavior: Behavior normal.     Results for orders placed or performed in visit on 06/26/22  [facility]  Result Value Ref Range   Glucose 145 (H) 70 - 99 mg/dL   BUN 10 8 - 27 mg/dL   Creatinine, Ser 0.79 0.57 - 1.00 mg/dL   eGFR 77 >59 mL/min/1.73   BUN/Creatinine Ratio 13 12 - 28   Sodium 138 134 - 144 mmol/L   Potassium 4.3 3.5 - 5.2 mmol/L   Chloride 99 96 - 106 mmol/L   CO2 20 20 - 29 mmol/L   Calcium 9.9 8.7 - 10.3 mg/dL  Bayer DCA Hb A1c Waived  Result Value Ref Range   HB A1C (BAYER DCA - WAIVED) 6.9 (H) 4.8 - 5.6 %    Assessment & Plan:   Problem List Items Addressed This Visit       Endocrine   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[place]) - Primary   Relevant Medications   ALPRAZolam (XANAX) 0.5 MG tablet     Other   Depression   Relevant Medications   ALPRAZolam (XANAX) 0.5 MG tablet   Other Visit Diagnoses     Controlled substance agreement signed       Relevant Medications   ALPRAZolam (XANAX) 0.5 MG tablet     Patient doing good off of taper and feeling better today.  Will continue with the taper of gabapentin and she is using estrogen oral for now for the neuropathy but Will discuss options in the future if needed.  Follow up plan: Return in about 3 months (around 09/29/2022), or if symptoms  worsen or fail to improve, for Diabetes recheck.  Counseling provided for all of the vaccine components No orders of the defined types were placed in this encounter.   Caryl Pina, MD [facility] 06/29/2022, 11:52 AM

## 2022-07-20 ENCOUNTER — Other Ambulatory Visit: Payer: Self-pay | Admitting: Family Medicine

## 2022-07-20 DIAGNOSIS — E1142 Type 2 diabetes mellitus with diabetic polyneuropathy: Secondary | ICD-10-CM

## 2022-07-20 DIAGNOSIS — K080 Exfoliation of teeth due to systemic causes: Secondary | ICD-10-CM | POA: Diagnosis not present

## 2022-07-20 DIAGNOSIS — E11610 Type 2 diabetes mellitus with diabetic neuropathic arthropathy: Secondary | ICD-10-CM

## 2022-07-20 DIAGNOSIS — E1143 Type 2 diabetes mellitus with diabetic autonomic (poly)neuropathy: Secondary | ICD-10-CM

## 2022-07-21 NOTE — Progress Notes (Addendum)
PCP - LOV 06-29-22 epic Arville Care, MD Cardiologist - Jodelle Red, MD LOV 02-25-18 epic  no longer sees Pulm- 10-15-21 epic Kandice Robinsons, NP  PPM/ICD -  Device Orders -  Rep Notified -   Chest x-ray - CT chest 10-22-21 epic EKG - 09-05-21 epic Stress Test - stress echo 05-04-19  ECHO - 2020 Cardiac Cath -  HgbA1c- 06-26-22 epic  6.9  Sleep Study -  CPAP -   Fasting Blood Sugar -  Checks Blood Sugar _____ times a day  Last dose of GLP1 agonist- 07-26-22 GLP1 instructions: ozempic  hold 1 week none after 07-26-22  Synjardy- hold 72 hours none after 07-30-22   Blood Thinner Instructions:Plavix Aspirin Instructions:  ERAS Protcol - PRE-SURGERY  G2-   COVID vaccine-x3  Activity- Able to complete ADL's without SOB or CP Anesthesia review: HTN,PVD, DMII, TIA   Patient denies shortness of breath, fever, cough and chest pain at PAT appointment   All instructions explained to the patient, with a verbal understanding of the material. Patient agrees to go over the instructions while at home for a better understanding. Patient also instructed to self quarantine after being tested for COVID-19. The opportunity to ask questions was provided.

## 2022-07-21 NOTE — Patient Instructions (Addendum)
SURGICAL WAITING ROOM VISITATION  Patients having surgery or a procedure may have no more than 2 support people in the waiting area - these visitors may rotate.    Children under the age of 46 must have an adult with them who is not the patient.  Due to an increase in RSV and influenza rates and associated hospitalizations, children ages 51 and under may not visit patients in [facility] hospitals.  If the patient needs to stay at the hospital during part of their recovery, the visitor guidelines for inpatient rooms apply. Pre-op nurse will coordinate an appropriate time for 1 support person to accompany patient in pre-op.  This support person may not rotate.    Please refer to the [facility] website for the visitor guidelines for Inpatients (after your surgery is over and you are in a regular room).       Your procedure is scheduled on: 08-03-22   Report to [facility] Main Entrance    Report to admitting at       1:45  PM   Call this number if you have problems the morning of surgery [phone]   Do not eat food :After Midnight.   After Midnight you may have the following liquids until __1:00 PM____DAY OF SURGERY  Water Non-Citrus Juices (without pulp, NO RED-Apple, White grape, White cranberry) Black Coffee (NO MILK/CREAM OR CREAMERS, sugar ok)  Clear Tea (NO MILK/CREAM OR CREAMERS, sugar ok) regular and decaf                             Plain Jell-O (NO RED)                                           Fruit ices (not with fruit pulp, NO RED)                                     Popsicles (NO RED)                                                               Sports drinks like Gatorade (NO RED)                      The day of surgery:  Drink ONE (1) Pre-SurgeryG2 at     1245 PM the morning of surgery. Drink in one sitting. Do not sip.  This drink was given to you during your hospital  pre-op appointment visit. Nothing else to drink after completing the   Pre-Surgery  G2.  By 1:00 PM          If you have questions, please contact your surgeon's office.   FOLLOW  ANY ADDITIONAL PRE OP INSTRUCTIONS YOU RECEIVED FROM YOUR SURGEON'S OFFICE!!!     Oral Hygiene is also important to reduce your risk of infection.  Remember - BRUSH YOUR TEETH THE MORNING OF SURGERY WITH YOUR REGULAR TOOTHPASTE  DENTURES WILL BE REMOVED PRIOR TO SURGERY PLEASE DO NOT APPLY "Poly grip" OR ADHESIVES!!!   Do NOT smoke after Midnight   Take these medicines the morning of surgery with A SIP OF WATER: Xanax if needed, atorvastatin, pantoprazole, hydrocodone if needed, zetia, lexapro              DO NOT TAKE ANY ORAL DIABETIC MEDICATIONS DAY OF YOUR SURGERY  How to Manage Your Diabetes Before and After Surgery  Why is it important to control my blood sugar before and after surgery? Improving blood sugar levels before and after surgery helps healing and can limit problems. A way of improving blood sugar control is eating a healthy diet by:  Eating less sugar and carbohydrates  Increasing activity/exercise  Talking with your doctor about reaching your blood sugar goals High blood sugars (greater than 180 mg/dL) can raise your risk of infections and slow your recovery, so you will need to focus on controlling your diabetes during the weeks before surgery. Make sure that the doctor who takes care of your diabetes knows about your planned surgery including the date and location.  How do I manage my blood sugar before surgery? Check your blood sugar at least 4 times a day, starting 2 days before surgery, to make sure that the level is not too high or low. Check your blood sugar the morning of your surgery when you wake up and every 2 hours until you get to the Short Stay unit. If your blood sugar is less than 70 mg/dL, you will need to treat for low blood sugar: Do not take insulin. Treat a low blood sugar (less than 70 mg/dL) with   cup of clear juice (cranberry or apple), 4 glucose tablets, OR glucose gel. Recheck blood sugar in 15 minutes after treatment (to make sure it is greater than 70 mg/dL). If your blood sugar is not greater than 70 mg/dL on recheck, call [phone] for further instructions. Report your blood sugar to the short stay nurse when you get to Short Stay.  If you are admitted to the hospital after surgery: Your blood sugar will be checked by the staff and you will probably be given insulin after surgery (instead of oral diabetes medicines) to make sure you have good blood sugar levels. The goal for blood sugar control after surgery is 80-180 mg/dL.   WHAT DO I DO ABOUT MY DIABETES MEDICATION?  Do not take oral diabetes medicines (pills) the morning of surgery.  HOLD SYNJARDY 72 HOURS PRIOR TO SURGERY LAST DOSE-07-30-22 NO ozempic after 07-26-22  DO NOT TAKE THE FOLLOWING 7 DAYS PRIOR TO SURGERY: Ozempic, Wegovy, Rybelsus (Semaglutide), Byetta (exenatide), Bydureon (exenatide ER), Victoza, Saxenda (liraglutide), or Trulicity (dulaglutide) Mounjaro (Tirzepatide) Adlyxin (Lixisenatide), Polyethylene Glycol Loxenatide.     Bring CPAP mask and tubing day of surgery.                              You may not have any metal on your body including hair pins, jewelry, and body piercing             Do not wear make-up, lotions, powders, perfumes, or deodorant  Do not wear nail polish including gel and S&S, artificial/acrylic nails, or any other type of covering on natural nails including finger and toenails. If you have artificial nails, gel coating, etc. that  needs to be removed by a nail salon please have this removed prior to surgery or surgery may need to be canceled/ delayed if the surgeon/ anesthesia feels like they are unable to be safely monitored.   Do not shave  48 hours prior to surgery.                Do not bring valuables to the hospital. [facility] IS NOT             RESPONSIBLE   FOR  VALUABLES.   Contacts, glasses, dentures or bridgework may not be worn into surgery.   Bring small overnight bag day of surgery.   DO NOT BRING YOUR HOME MEDICATIONS TO THE HOSPITAL. PHARMACY WILL DISPENSE MEDICATIONS LISTED ON YOUR MEDICATION LIST TO YOU DURING YOUR ADMISSION IN THE HOSPITAL!    Patients discharged on the day of surgery will not be allowed to drive home.  Someone NEEDS to stay with you for the first 24 hours after anesthesia.                Please read over the following fact sheets you were given: IF YOU HAVE QUESTIONS ABOUT YOUR PRE-OP INSTRUCTIONS PLEASE CALL [phone]    If you test positive for Covid or have been in contact with anyone that has tested positive in the last 10 days please notify you surgeon.    [facility] - Preparing for Surgery Before surgery, you can play an important role.  Because skin is not sterile, your skin needs to be as free of germs as possible.  You can reduce the number of germs on your skin by washing with CHG (chlorahexidine gluconate) soap before surgery.  CHG is an antiseptic cleaner which kills germs and bonds with the skin to continue killing germs even after washing. Please DO NOT use if you have an allergy to CHG or antibacterial soaps.  If your skin becomes reddened/irritated stop using the CHG and inform your nurse when you arrive at Short Stay. Do not shave (including legs and underarms) for at least 48 hours prior to the first CHG shower.  You may shave your face/neck. Please follow these instructions carefully:  1.  Shower with CHG Soap the night before surgery and the  morning of Surgery.  2.  If you choose to wash your hair, wash your hair first as usual with your  normal  shampoo.  3.  After you shampoo, rinse your hair and body thoroughly to remove the  shampoo.                           4.  Use CHG as you would any other liquid soap.  You can apply chg directly  to the skin and wash                       Gently with a  scrungie or clean washcloth.  5.  Apply the CHG Soap to your body ONLY FROM THE NECK DOWN.   Do not use on face/ open                           Wound or open sores. Avoid contact with eyes, ears mouth and genitals (private parts).                       Wash face,  Medical illustrator (private  parts) with your normal soap.             6.  Wash thoroughly, paying special attention to the area where your surgery  will be performed.  7.  Thoroughly rinse your body with warm water from the neck down.  8.  DO NOT shower/wash with your normal soap after using and rinsing off  the CHG Soap.                9.  Pat yourself dry with a clean towel.            10.  Wear clean pajamas.            11.  Place clean sheets on your bed the night of your first shower and do not  sleep with pets. Day of Surgery : Do not apply any lotions/deodorants the morning of surgery.  Please wear clean clothes to the hospital/surgery center.  FAILURE TO FOLLOW THESE INSTRUCTIONS MAY RESULT IN THE CANCELLATION OF YOUR SURGERY PATIENT SIGNATURE_________________________________  NURSE SIGNATURE__________________________________  ________________________________________________________________________  [name]  An incentive spirometer is a tool that can help keep your lungs clear and active. This tool measures how well you are filling your lungs with each breath. Taking long deep breaths may help reverse or decrease the chance of developing breathing (pulmonary) problems (especially infection) following: A long period of time when you are unable to move or be active. BEFORE THE PROCEDURE  If the spirometer includes an indicator to show your best effort, your nurse or respiratory therapist will set it to a desired goal. If possible, sit up straight or lean slightly forward. Try not to slouch. Hold the incentive spirometer in an upright position. INSTRUCTIONS FOR USE  Sit on the edge of your bed if possible, or sit up as  far as you can in bed or on a chair. Hold the incentive spirometer in an upright position. Breathe out normally. Place the mouthpiece in your mouth and seal your lips tightly around it. Breathe in slowly and as deeply as possible, raising the piston or the ball toward the top of the column. Hold your breath for 3-5 seconds or for as long as possible. Allow the piston or ball to fall to the bottom of the column. Remove the mouthpiece from your mouth and breathe out normally. Rest for a few seconds and repeat Steps 1 through 7 at least 10 times every 1-2 hours when you are awake. Take your time and take a few normal breaths between deep breaths. The spirometer may include an indicator to show your best effort. Use the indicator as a goal to work toward during each repetition. After each set of 10 deep breaths, practice coughing to be sure your lungs are clear. If you have an incision (the cut made at the time of surgery), support your incision when coughing by placing a pillow or rolled up towels firmly against it. Once you are able to get out of bed, walk around indoors and cough well. You may stop using the incentive spirometer when instructed by your caregiver.  RISKS AND COMPLICATIONS Take your time so you do not get dizzy or light-headed. If you are in pain, you may need to take or ask for pain medication before doing incentive spirometry. It is harder to take a deep breath if you are having pain. AFTER USE Rest and breathe slowly and easily. It can be helpful to keep track of a log of your progress. Your  caregiver can provide you with a simple table to help with this. If you are using the spirometer at home, follow these instructions: SEEK MEDICAL CARE IF:  You are having difficultly using the spirometer. You have trouble using the spirometer as often as instructed. Your pain medication is not giving enough relief while using the spirometer. You develop fever of 100.5 F (38.1 C) or  higher. SEEK IMMEDIATE MEDICAL CARE IF:  You cough up bloody sputum that had not been present before. You develop fever of 102 F (38.9 C) or greater. You develop worsening pain at or near the incision site. MAKE SURE YOU:  Understand these instructions. Will watch your condition. Will get help right away if you are not doing well or get worse. Document Released: 08/03/2006 Document Revised: 06/15/2011 Document Reviewed: 10/04/2006 [facility] Patient Information 2014 [place], [place].   ________________________________________________________________________

## 2022-07-22 ENCOUNTER — Telehealth: Payer: Self-pay

## 2022-07-22 ENCOUNTER — Ambulatory Visit (INDEPENDENT_AMBULATORY_CARE_PROVIDER_SITE_OTHER): Payer: Medicare Other

## 2022-07-22 VITALS — Ht 66.0 in | Wt 194.0 lb

## 2022-07-22 DIAGNOSIS — Z0000 Encounter for general adult medical examination without abnormal findings: Secondary | ICD-10-CM | POA: Diagnosis not present

## 2022-07-22 NOTE — Progress Notes (Signed)
Subjective:   [name] is a 77 y.o. female who presents for Medicare Annual (Subsequent) preventive examination.  I connected with  Arlana Pouch on 07/22/22 by a audio enabled telemedicine application and verified that I am speaking with the correct person using two identifiers.  Patient Location: Home  Provider Location: Home Office  I discussed the limitations of evaluation and management by telemedicine. The patient expressed understanding and agreed to proceed.  Review of Systems     Cardiac Risk Factors include: diabetes mellitus;advanced age (>5men, >55 women);hypertension;dyslipidemia;sedentary lifestyle     Objective:    Today's Vitals   07/22/22 1340  Weight: 194 lb (88 kg)  Height: 5\' 6"  (1.676 m)   Body mass index is 31.31 kg/m.     07/22/2022    1:46 PM 10/28/2021   11:58 AM 09/10/2021   12:54 PM 09/05/2021    9:17 AM 07/16/2021    3:44 PM 01/03/2021    9:47 PM 12/30/2020    2:12 PM  Advanced Directives  Does Patient Have a Medical Advance Directive? Yes Yes Yes Yes Yes No No  Type of Advance Directive Living will  Living will Living will Living will    Does patient want to make changes to medical advance directive? No - Patient declined  No - Patient declined        Current Medications (verified) Outpatient Encounter Medications as of 07/22/2022  Medication Sig   ALPRAZolam (XANAX) 0.5 MG tablet Take 1-2 tablets (0.5-1 mg total) by mouth daily as needed for anxiety. Take 1-2 tabs daily as needed   ARIPiprazole (ABILIFY) 2 MG tablet TAKE 1 TABLET DAILY (Patient taking differently: Take 1 mg by mouth at bedtime.)   atorvastatin (LIPITOR) 10 MG tablet Take 1 tablet (10 mg total) by mouth daily. (Patient taking differently: Take 10 mg by mouth every Monday, Wednesday, and Friday.)   blood glucose meter kit and supplies KIT Dispense based on patient and insurance preference. Use up to two times daily as directed. (Dx: type 2 DM - E11.9)   Blood Glucose  Monitoring Suppl (ONE TOUCH ULTRA 2) w/Device KIT CHECK BLOOD SUGAR UP TO TWICE DAILY Dx E11.9   Cholecalciferol (VITAMIN D) 50 MCG (2000 UT) tablet Take 2,000 Units by mouth every morning.   clopidogrel (PLAVIX) 75 MG tablet Take 1 tablet (75 mg total) by mouth daily.   conjugated estrogens (PREMARIN) vaginal cream USE VAGINALLY AT BEDTIME AS DIRECTED (Patient taking differently: Place 1 applicator vaginally daily as needed (irritation).)   docusate sodium (COLACE) 100 MG capsule Take 1 capsule (100 mg total) by mouth every 12 (twelve) hours. (Patient taking differently: Take 100 mg by mouth daily as needed for mild constipation.)   Empagliflozin-metFORMIN HCl ER (SYNJARDY XR) 25-1000 MG TB24 TAKE 1 TABLET BY MOUTH DAILY   escitalopram (LEXAPRO) 20 MG tablet Take 1 tablet (20 mg total) by mouth daily.   ezetimibe (ZETIA) 10 MG tablet Take 1 tablet (10 mg total) by mouth daily.   glipiZIDE (GLUCOTROL XL) 5 MG 24 hr tablet Take 1 tablet (5 mg total) by mouth daily with breakfast.   glucose blood (ONETOUCH ULTRA) test strip Check BS up to 2 times daily Dx 11.43   HYDROcodone-acetaminophen (NORCO/VICODIN) 5-325 MG tablet Take 1 tablet by mouth every 6 (six) hours as needed for moderate pain.   Lidocaine HCl (ASPERCREME LIDOCAINE) 4 % LIQD Apply 1 spray topically at bedtime as needed (foot pain).   lisinopril-hydrochlorothiazide (ZESTORETIC) 20-25 MG tablet Take  1 tablet by mouth daily.   meclizine (ANTIVERT) 25 MG tablet TAKE ONE TABLET THREE TIMES DAILY AS NEEDED FOR DIZZINESS Strength: 25 mg   methocarbamol (ROBAXIN) 500 MG tablet Take 1 tablet (500 mg total) by mouth every 6 (six) hours as needed for muscle spasms.   nicotine polacrilex (NICORETTE) 2 MG gum Take 2 mg by mouth as needed for smoking cessation.   OneTouch Delica Lancets 33G MISC CHECK BLOOD SUGAR UP TO TWICE DAILY Dx E11.9   pantoprazole (PROTONIX) 40 MG tablet Take 1 tablet (40 mg total) by mouth daily.   polyethylene glycol (MIRALAX  / GLYCOLAX) 17 g packet Take 17 g by mouth 2 (two) times daily as needed (constipation).   Semaglutide, 1 MG/DOSE, (OZEMPIC, 1 MG/DOSE,) 4 MG/3ML SOPN Inject 1 mg into the skin once a week.   Tea Tree Oil (AUSTRALIAN TEA TREE) 100 % OIL Apply 1 application  topically at bedtime as needed (foot pain).   No facility-administered encounter medications on file as of 07/22/2022.    Allergies (verified) Penicillins, Codeine, Erythromycin, Fenofibrate, Lyrica [pregabalin], Statins, and Sulfa antibiotics   History: Past Medical History:  Diagnosis Date   Allergy    seasonal   Anxiety    Arthritis    [place]    Colon polyps    Complication of anesthesia    per pt, she woke up in the middle of last colon in 2014.   Depression    Diabetes mellitus, type 2 06/2010   Diabetic neuropathy    Diverticulosis    pt unaware   Esophageal stricture    Fibromyalgia    [place]    GERD (gastroesophageal reflux disease)    Hemorrhoids    Hiatal hernia    Hyperlipidemia    Hypertension    Menopause    Numbness    Peripheral vascular insufficiency    Retinopathy    Bilateral   TIA (transient ischemic attack)    Tremor    Vitamin D deficiency    Past Surgical History:  Procedure Laterality Date   ABDOMINAL HYSTERECTOMY     partial and then total   ANKLE SURGERY Right    BACK SURGERY     COLONOSCOPY     ELBOW SURGERY     left   KNEE ARTHROSCOPY Right 09/12/2018   Procedure: ARTHROSCOPY KNEE; SYNOVECTOMY;  Surgeon: Ollen Gross, MD;  Location: [facility];  Service: Orthopedics;  Laterality: Right;    PATELLA REVISION Right 09/10/2021   Procedure: Resection arthroplasty right patella;  Surgeon: Ollen Gross, MD;  Location: [facility];  Service: Orthopedics;  Laterality: Right;   REFRACTIVE SURGERY  2003   SPINAL CORD STIMULATOR INSERTION N/A 01/01/2021   Procedure: SPINAL CORD STIMULATOR PLACEMENT;  Surgeon: Venita Lick, MD;  Location: [facility];  Service: Orthopedics;  Laterality:  N/A;   TOTAL KNEE ARTHROPLASTY     bilateral   TOTAL KNEE REVISION Right 03/09/2018   Procedure: RIGHT TOTAL KNEE REVISION;  Surgeon: Ollen Gross, MD;  Location: [facility];  Service: Orthopedics;  Laterality: Right;  with abductor block   VESICOVAGINAL FISTULA CLOSURE W/ TAH  1981   Family History  Problem Relation Age of Onset   Diabetes Mother    Heart failure Mother    Hypertension Mother    Cirrhosis Father    Cirrhosis Brother    Social History   Socioeconomic History   Marital status: Married    Spouse name: Dannielle Huh   Number of children: 2  Years of education: two years of college   Highest education level: Some college, no degree  Occupational History   Occupation: Retired  Tobacco Use   Smoking status: Former    Packs/day: 1.00    Years: 10.00    Additional pack years: 0.00    Total pack years: 10.00    Types: Cigarettes    Start date: 11/07/1975    Quit date: 06/24/2010    Years since quitting: 12.0   Smokeless tobacco: Never  Vaping Use   Vaping Use: Never used  Substance and Sexual Activity   Alcohol use: Not Currently   Drug use: No   Sexual activity: Yes    Birth control/protection: Surgical  Other Topics Concern   Not on file  Social History Narrative   Lives at home with her husband.   Left-handed.   One can of Diet Coke per day.   Social Determinants of Health   Financial Resource Strain: Low Risk  (07/16/2021)   Overall Financial Resource Strain (CARDIA)    Difficulty of Paying Living Expenses: Not hard at all  Food Insecurity: No Food Insecurity (07/16/2021)   Hunger Vital Sign    Worried About Running Out of Food in the Last Year: Never true    Ran Out of Food in the Last Year: Never true  Transportation Needs: No Transportation Needs (07/16/2021)   PRAPARE - Administrator, Civil Service (Medical): No    Lack of Transportation (Non-Medical): No  Physical Activity: Inactive (07/16/2021)   Exercise Vital Sign    Days of  Exercise per Week: 0 days    Minutes of Exercise per Session: 0 min  Stress: No Stress Concern Present (07/16/2021)   [employer] of Occupational Health - Occupational Stress Questionnaire    Feeling of Stress : Not at all  Social Connections: Socially Integrated (07/16/2021)   Social Connection and Isolation Panel [NHANES]    Frequency of Communication with Friends and Family: More than three times a week    Frequency of Social Gatherings with Friends and Family: More than three times a week    Attends Religious Services: More than 4 times per year    Active Member of [employer] or Organizations: Yes    Attends Engineer, structural: More than 4 times per year    Marital Status: Married    Tobacco Counseling Counseling given: Not Answered   Clinical Intake:  Pre-visit preparation completed: Yes  Pain : No/denies pain  Diabetes: Yes CBG done?: No Did pt. bring in CBG monitor from home?: No  How often do you need to have someone help you when you read instructions, pamphlets, or other written materials from your doctor or pharmacy?: 1 - Never  Diabetic?Yes   Nutrition Risk Assessment:  Has the patient had any N/V/D within the last 2 months?  No  Does the patient have any non-healing wounds?  No  Has the patient had any unintentional weight loss or weight gain?  No   Diabetes:  Is the patient diabetic?  Yes  If diabetic, was a CBG obtained today?  No  Did the patient bring in their glucometer from home?  No  How often do you monitor your CBG's? daily.   Financial Strains and Diabetes Management:  Are you having any financial strains with the device, your supplies or your medication? Yes . Cost of Ozempic and Synjardy are expensive  Does the patient want to be seen by Chronic Care Management for management  of their diabetes?  No  Would the patient like to be referred to a Nutritionist or for Diabetic Management?  No   Diabetic Exams:  Diabetic Eye Exam:  Completed 09/08/21 Diabetic Foot Exam: Completed 12/26/21   Interpreter Needed?: No  Information entered by :: Kandis Fantasia LPN   Activities of Daily Living    07/22/2022    1:41 PM 09/10/2021    5:43 PM  In your present state of health, do you have any difficulty performing the following activities:  Hearing? 0 0  Vision? 0 0  Difficulty concentrating or making decisions? 0 0  Walking or climbing stairs? 1 0  Dressing or bathing? 0 0  Doing errands, shopping? 1 0  Preparing Food and eating ? N   Using the Toilet? N   In the past six months, have you accidently leaked urine? N   Do you have problems with loss of bowel control? N   Managing your Medications? N   Managing your Finances? N   Housekeeping or managing your Housekeeping? Y     Patient Care Team: Dettinger, Elige Radon, MD as PCP - General (Family Medicine) Jodelle Red, MD as PCP - Cardiology (Cardiology) Hilarie Fredrickson, MD as Consulting Physician (Gastroenterology) Andee Poles, MD as Consulting Physician (Psychiatry) Sheran Luz, MD as Consulting Physician (Physical Medicine and Rehabilitation) Blima Ledger, OD as Consulting Physician (Optometry) Danella Maiers, [facility] (Pharmacist)  Indicate any recent Medical Services you may have received from other than [facility] providers in the past year (date may be approximate).     Assessment:   This is a routine wellness examination for Sha.  Hearing/Vision screen Hearing Screening - Comments:: Denies hearing difficulties    Vision Screening - Comments:: up to date with routine eye exams with Dr. Hyacinth Meeker    Dietary issues and exercise activities discussed: Current Exercise Habits: The patient does not participate in regular exercise at present, Time (Minutes): 30   Goals Addressed   None   Depression Screen    06/29/2022   11:31 AM 06/26/2022    9:08 AM 03/27/2022    2:24 PM 12/26/2021    9:58 AM 09/24/2021    3:13 PM 07/16/2021    3:41 PM  05/16/2021    1:10 PM  PHQ 2/9 Scores  PHQ - 2 Score 0 1  PHQ- 9 Score Fall Risk    07/22/2022    1:41 PM 06/29/2022   11:31 AM 06/26/2022    9:07 AM 03/27/2022    2:24 PM 12/26/2021    9:58 AM  Fall Risk   Falls in the past year? 0 0 0 0 0  Number falls in past yr: 0      Injury with Fall? 0      Risk for fall due to : No Fall Risks      Follow up Falls prevention discussed;Education provided;Falls evaluation completed        FALL RISK PREVENTION PERTAINING TO THE HOME:  Any stairs in or around the home? No  If so, are there any without handrails? No  Home free of loose throw rugs in walkways, pet beds, electrical cords, etc? Yes  Adequate lighting in your home to reduce risk of falls? Yes   ASSISTIVE DEVICES UTILIZED TO PREVENT FALLS:  Life alert? No  Use of a cane, walker or w/c? Yes  Grab bars in the bathroom? Yes  Shower  chair or bench in shower? No  Elevated toilet seat or a handicapped toilet? Yes   TIMED UP AND GO:  Was the test performed? No . Telephonic visit   Cognitive Function:    04/30/2016   11:23 AM 03/11/2015    8:24 AM  MMSE - Mini Mental State Exam  Orientation to time 5 5  Orientation to Place 5 5  Registration 3 3  Attention/ Calculation 4 5  Recall 3 3  Language- name 2 objects 2 2  Language- repeat 1 1  Language- follow 3 step command 3 3  Language- read & follow direction 1 1  Write a sentence 1 1  Copy design 1 1  Total score 29 30        07/22/2022    1:46 PM 05/31/2020   11:25 AM 12/14/2018    1:39 PM  6CIT Screen  What Year? 0 points 0 points 0 points  What month? 0 points 0 points 0 points  What time? 0 points 0 points 0 points  Count back from 20 0 points 0 points 0 points  Months in reverse 0 points 0 points 2 points  Repeat phrase 0 points 0 points 0 points  Total Score 0 points 0 points 2 points    Immunizations Immunization History  Administered Date(s) Administered   Fluad Quad(high  Dose 65+) 01/24/2020, 02/01/2021, 12/26/2021   H1N1 03/26/2008   Influenza, High Dose Seasonal PF 01/29/2016, 12/31/2017   Influenza,inj,Quad PF,6+ Mos 01/30/2013, 01/14/2015, 02/06/2019   Influenza,inj,quad, With Preservative 01/04/2014, 01/27/2017   Influenza-Unspecified 01/27/2010, 02/05/2011, 01/19/2012, 02/04/2017, 02/06/2019   Moderna Sars-Covid-2 Vaccination 09/24/2020   PFIZER(Purple Top)SARS-COV-2 Vaccination 04/26/2019, 05/17/2019   Pneumococcal Conjugate-13 02/28/2013   Pneumococcal Polysaccharide-23 02/05/2011   Tdap 09/03/2016   Zoster Recombinat (Shingrix) 11/26/2018, 01/24/2019, 02/06/2019   Zoster, Live 08/08/2009    TDAP status: Up to date  Flu Vaccine status: Up to date  Pneumococcal vaccine status: Up to date  Covid-19 vaccine status: Information provided on how to obtain vaccines.   Qualifies for Shingles Vaccine? Yes   Zostavax completed Yes   Shingrix Completed?: Yes  Screening Tests Health Maintenance  Topic Date Due   COVID-19 Vaccine (4 - 2023-24 season) 12/05/2021   OPHTHALMOLOGY EXAM  09/09/2022   INFLUENZA VACCINE  11/05/2022   Diabetic kidney evaluation - Urine ACR  12/27/2022   FOOT EXAM  12/27/2022   HEMOGLOBIN A1C  12/27/2022   Diabetic kidney evaluation - eGFR measurement  06/26/2023   Medicare Annual Wellness (AWV)  07/22/2023   COLONOSCOPY (Pts 45-71yrs Insurance coverage will need to be confirmed)  11/16/2023   DEXA SCAN  03/31/2024   DTaP/Tdap/Td (2 - Td or Tdap) 09/04/2026   Pneumonia Vaccine 64+ Years old  Completed   Hepatitis C Screening  Completed   Zoster Vaccines- Shingrix  Completed   HPV VACCINES  Aged Out    Health Maintenance  Health Maintenance Due  Topic Date Due   COVID-19 Vaccine (4 - 2023-24 season) 12/05/2021    Colorectal cancer screening: Type of screening: Colonoscopy. Completed 11/16/18. Repeat every 5 years  Mammogram status: Completed 09/23/21. Repeat every year  Bone Density status: Completed  03/31/22. Results reflect: Bone density results: OSTEOPENIA. Repeat every 2 years.  Lung Cancer Screening: (Low Dose CT Chest recommended if Age 41-80 years, 30 pack-year currently smoking OR have quit w/in 15years.) does not qualify.   Lung Cancer Screening Referral: n/a  Additional Screening:  Hepatitis C Screening: does qualify; Completed 02/07/15  Vision  Screening: Recommended annual ophthalmology exams for early detection of glaucoma and other disorders of the eye. Is the patient up to date with their annual eye exam?  Yes  Who is the provider or what is the name of the office in which the patient attends annual eye exams? Dr. Hyacinth Meeker If pt is not established with a provider, would they like to be referred to a provider to establish care? No .   Dental Screening: Recommended annual dental exams for proper oral hygiene  Community Resource Referral / Chronic Care Management: CRR required this visit?  No   CCM required this visit?  No      Plan:     I have personally reviewed and noted the following in the patient's chart:   Medical and social history Use of alcohol, tobacco or illicit drugs  Current medications and supplements including opioid prescriptions. Patient is currently taking opioid prescriptions. Information provided to patient regarding non-opioid alternatives. Patient advised to discuss non-opioid treatment plan with their provider. Functional ability and status Nutritional status Physical activity Advanced directives List of other physicians Hospitalizations, surgeries, and ER visits in previous 12 months Vitals Screenings to include cognitive, depression, and falls Referrals and appointments  In addition, I have reviewed and discussed with patient certain preventive protocols, quality metrics, and best practice recommendations. A written personalized care plan for preventive services as well as general preventive health recommendations were provided to  patient.     Durwin Nora, [place]   1/61/0960   Due to this being a virtual visit, the after visit summary with patients personalized plan was offered to patient via mail or my-chart.  per request, patient was mailed a copy of AVS  Nurse Notes: See telephone note with patient concern

## 2022-07-22 NOTE — Patient Instructions (Signed)
[name] , Thank you for taking time to come for your Medicare Wellness Visit. I appreciate your ongoing commitment to your health goals. Please review the following plan we discussed and let me know if I can assist you in the future.   These are the goals we discussed:  Goals      DIET - INCREASE WATER INTAKE     Try to drink 6-8 glasses of water daily.     Prevent falls     Stay active         This is a list of the screening recommended for you and due dates:  Health Maintenance  Topic Date Due   COVID-19 Vaccine (4 - 2023-24 season) 12/05/2021   Eye exam for diabetics  09/09/2022   Flu Shot  11/05/2022   Yearly kidney health urinalysis for diabetes  12/27/2022   Complete foot exam   12/27/2022   Hemoglobin A1C  12/27/2022   Yearly kidney function blood test for diabetes  06/26/2023   Medicare Annual Wellness Visit  07/22/2023   Colon Cancer Screening  11/16/2023   DEXA scan (bone density measurement)  03/31/2024   DTaP/Tdap/Td vaccine (2 - Td or Tdap) 09/04/2026   Pneumonia Vaccine  Completed   Hepatitis C Screening: USPSTF Recommendation to screen - Ages 29-79 yo.  Completed   Zoster (Shingles) Vaccine  Completed   HPV Vaccine  Aged Out    Advanced directives: Please bring a copy of your health care power of attorney and living will to the office to be added to your chart at your convenience.   Conditions/risks identified: Aim for 30 minutes of exercise or brisk walking, 6-8 glasses of water, and 5 servings of fruits and vegetables each day.   Next appointment: Follow up in one year for your annual wellness visit    Preventive Care 65 Years and Older, Female Preventive care refers to lifestyle choices and visits with your health care provider that can promote health and wellness. What does preventive care include? A yearly physical exam. This is also called an annual well check. Dental exams once or twice a year. Routine eye exams. Ask your health care provider how  often you should have your eyes checked. Personal lifestyle choices, including: Daily care of your teeth and gums. Regular physical activity. Eating a healthy diet. Avoiding tobacco and drug use. Limiting alcohol use. Practicing safe sex. Taking low-dose aspirin every day. Taking vitamin and mineral supplements as recommended by your health care provider. What happens during an annual well check? The services and screenings done by your health care provider during your annual well check will depend on your age, overall health, lifestyle risk factors, and family history of disease. Counseling  Your health care provider may ask you questions about your: Alcohol use. Tobacco use. Drug use. Emotional well-being. Home and relationship well-being. Sexual activity. Eating habits. History of falls. Memory and ability to understand (cognition). Work and work Astronomer. Reproductive health. Screening  You may have the following tests or measurements: Height, weight, and BMI. Blood pressure. Lipid and cholesterol levels. These may be checked every 5 years, or more frequently if you are over 57 years old. Skin check. Lung cancer screening. You may have this screening every year starting at age 50 if you have a 30-pack-year history of smoking and currently smoke or have quit within the past 15 years. Fecal occult blood test (FOBT) of the stool. You may have this test every year starting at age 92.  Flexible sigmoidoscopy or colonoscopy. You may have a sigmoidoscopy every 5 years or a colonoscopy every 10 years starting at age 41. Hepatitis C blood test. Hepatitis B blood test. Sexually transmitted disease (STD) testing. Diabetes screening. This is done by checking your blood sugar (glucose) after you have not eaten for a while (fasting). You may have this done every 1-3 years. Bone density scan. This is done to screen for osteoporosis. You may have this done starting at age 67. Mammogram.  This may be done every 1-2 years. Talk to your health care provider about how often you should have regular mammograms. Talk with your health care provider about your test results, treatment options, and if necessary, the need for more tests. Vaccines  Your health care provider may recommend certain vaccines, such as: Influenza vaccine. This is recommended every year. Tetanus, diphtheria, and acellular pertussis (Tdap, Td) vaccine. You may need a Td booster every 10 years. Zoster vaccine. You may need this after age 52. Pneumococcal 13-valent conjugate (PCV13) vaccine. One dose is recommended after age 26. Pneumococcal polysaccharide (PPSV23) vaccine. One dose is recommended after age 29. Talk to your health care provider about which screenings and vaccines you need and how often you need them. This information is not intended to replace advice given to you by your health care provider. Make sure you discuss any questions you have with your health care provider. Document Released: 04/19/2015 Document Revised: 12/11/2015 Document Reviewed: 01/22/2015 Elsevier Interactive Patient Education  2017 [place] Prevention in the Home Falls can cause injuries. They can happen to people of all ages. There are many things you can do to make your home safe and to help prevent falls. What can I do on the outside of my home? Regularly fix the edges of walkways and driveways and fix any cracks. Remove anything that might make you trip as you walk through a door, such as a raised step or threshold. Trim any bushes or trees on the path to your home. Use bright outdoor lighting. Clear any walking paths of anything that might make someone trip, such as rocks or tools. Regularly check to see if handrails are loose or broken. Make sure that both sides of any steps have handrails. Any raised decks and porches should have guardrails on the edges. Have any leaves, snow, or ice cleared regularly. Use sand  or salt on walking paths during winter. Clean up any spills in your garage right away. This includes oil or grease spills. What can I do in the bathroom? Use night lights. Install grab bars by the toilet and in the tub and shower. Do not use towel bars as grab bars. Use non-skid mats or decals in the tub or shower. If you need to sit down in the shower, use a plastic, non-slip stool. Keep the floor dry. Clean up any water that spills on the floor as soon as it happens. Remove soap buildup in the tub or shower regularly. Attach bath mats securely with double-sided non-slip rug tape. Do not have throw rugs and other things on the floor that can make you trip. What can I do in the bedroom? Use night lights. Make sure that you have a light by your bed that is easy to reach. Do not use any sheets or blankets that are too big for your bed. They should not hang down onto the floor. Have a firm chair that has side arms. You can use this for support while you get  dressed. Do not have throw rugs and other things on the floor that can make you trip. What can I do in the kitchen? Clean up any spills right away. Avoid walking on wet floors. Keep items that you use a lot in easy-to-reach places. If you need to reach something above you, use a strong step stool that has a grab bar. Keep electrical cords out of the way. Do not use floor polish or wax that makes floors slippery. If you must use wax, use non-skid floor wax. Do not have throw rugs and other things on the floor that can make you trip. What can I do with my stairs? Do not leave any items on the stairs. Make sure that there are handrails on both sides of the stairs and use them. Fix handrails that are broken or loose. Make sure that handrails are as long as the stairways. Check any carpeting to make sure that it is firmly attached to the stairs. Fix any carpet that is loose or worn. Avoid having throw rugs at the top or bottom of the stairs.  If you do have throw rugs, attach them to the floor with carpet tape. Make sure that you have a light switch at the top of the stairs and the bottom of the stairs. If you do not have them, ask someone to add them for you. What else can I do to help prevent falls? Wear shoes that: Do not have high heels. Have rubber bottoms. Are comfortable and fit you well. Are closed at the toe. Do not wear sandals. If you use a stepladder: Make sure that it is fully opened. Do not climb a closed stepladder. Make sure that both sides of the stepladder are locked into place. Ask someone to hold it for you, if possible. Clearly mark and make sure that you can see: Any grab bars or handrails. First and last steps. Where the edge of each step is. Use tools that help you move around (mobility aids) if they are needed. These include: Canes. Walkers. Scooters. Crutches. Turn on the lights when you go into a dark area. Replace any light bulbs as soon as they burn out. Set up your furniture so you have a clear path. Avoid moving your furniture around. If any of your floors are uneven, fix them. If there are any pets around you, be aware of where they are. Review your medicines with your doctor. Some medicines can make you feel dizzy. This can increase your chance of falling. Ask your doctor what other things that you can do to help prevent falls. This information is not intended to replace advice given to you by your health care provider. Make sure you discuss any questions you have with your health care provider. Document Released: 01/17/2009 Document Revised: 08/29/2015 Document Reviewed: 04/27/2014 Elsevier Interactive Patient Education  2017 [employer].

## 2022-07-22 NOTE — Telephone Encounter (Signed)
Patient seen for AWV and is asking if an alternative can be sent in to replace Gabapentin.  Please advise.

## 2022-07-22 NOTE — Telephone Encounter (Signed)
Pt informed. Appt scheduled for 4/26 at 4:10pm

## 2022-07-22 NOTE — Telephone Encounter (Signed)
We could discuss alternatives to gabapentin, would have to be discussed in a visit, to discuss risks and benefits and decide which agent she would like to try.

## 2022-07-24 ENCOUNTER — Other Ambulatory Visit: Payer: Self-pay

## 2022-07-24 ENCOUNTER — Telehealth: Payer: Self-pay | Admitting: Family Medicine

## 2022-07-24 ENCOUNTER — Encounter (HOSPITAL_COMMUNITY)
Admission: RE | Admit: 2022-07-24 | Discharge: 2022-07-24 | Disposition: A | Discharge status: Home / Self Care | Payer: Medicare Other | Source: Ambulatory Visit | Attending: Orthopedic Surgery | Admitting: Orthopedic Surgery

## 2022-07-24 ENCOUNTER — Encounter (HOSPITAL_COMMUNITY): Payer: Self-pay

## 2022-07-24 VITALS — BP 102/59 | HR 84 | Temp 98.3°F | Resp 16 | Ht 66.0 in | Wt 194.0 lb

## 2022-07-24 DIAGNOSIS — Z01812 Encounter for preprocedural laboratory examination: Secondary | ICD-10-CM | POA: Diagnosis not present

## 2022-07-24 DIAGNOSIS — E1142 Type 2 diabetes mellitus with diabetic polyneuropathy: Secondary | ICD-10-CM | POA: Diagnosis not present

## 2022-07-24 DIAGNOSIS — I10 Essential (primary) hypertension: Secondary | ICD-10-CM | POA: Diagnosis not present

## 2022-07-24 DIAGNOSIS — M25561 Pain in right knee: Secondary | ICD-10-CM | POA: Diagnosis not present

## 2022-07-24 DIAGNOSIS — M25571 Pain in right ankle and joints of right foot: Secondary | ICD-10-CM | POA: Diagnosis not present

## 2022-07-24 HISTORY — DX: Polyneuropathy in diseases classified elsewhere: G63

## 2022-07-24 LAB — CBC
HCT: 46.4 % — ABNORMAL HIGH (ref 36.0–46.0)
Hemoglobin: 14.8 g/dL (ref 12.0–15.0)
MCH: 28.5 pg (ref 26.0–34.0)
MCHC: 31.9 g/dL (ref 30.0–36.0)
MCV: 89.2 fL (ref 80.0–100.0)
Platelets: 244 10*3/uL (ref 150–400)
RBC: 5.2 MIL/uL — ABNORMAL HIGH (ref 3.87–5.11)
RDW: 14.6 % (ref 11.5–15.5)
WBC: 7.9 10*3/uL (ref 4.0–10.5)
nRBC: 0 % (ref 0.0–0.2)

## 2022-07-24 LAB — BASIC METABOLIC PANEL
Anion gap: 12 (ref 5–15)
BUN: 13 mg/dL (ref 8–23)
CO2: 23 mmol/L (ref 22–32)
Calcium: 9.5 mg/dL (ref 8.9–10.3)
Chloride: 98 mmol/L (ref 98–111)
Creatinine, Ser: 0.84 mg/dL (ref 0.44–1.00)
GFR, Estimated: >60 mL/min (ref >60)
Glucose, Bld: 153 mg/dL — ABNORMAL HIGH (ref 70–99)
Potassium: 4.3 mmol/L (ref 3.5–5.1)
Sodium: 133 mmol/L — ABNORMAL LOW (ref 135–145)

## 2022-07-24 LAB — GLUCOSE, CAPILLARY: Glucose-Capillary: 191 mg/dL — ABNORMAL HIGH (ref 70–99)

## 2022-07-24 NOTE — Telephone Encounter (Signed)
Per Dettinger pt should hold Plavix 5d prior to the procedure.  Pt has been informed. Dr. Tana Felts also was notified in February.

## 2022-07-24 NOTE — Telephone Encounter (Signed)
Patient has knee surgery scheduled for 4/29 and wants to know when she needs to stop taking her clopidogrel (PLAVIX) 75 MG tablet

## 2022-07-28 NOTE — Progress Notes (Addendum)
Case: [id number] Date/Time: 08/03/22 1459   Procedure: RIGHT KNEE PATELLECTOMY (Right: Knee)   Anesthesia type: Choice   Pre-op diagnosis: right knee patellar osteonecrosis   Location: [facility] ROOM 10 / [facility]   Surgeons: Ollen Gross, MD       DISCUSSION: [name] is a 77 yo female who presents to PAT clinic for surgery listed above. PMH significant for DM, HTN, hx of TIA, GERD, former smoker (quit 2012).   Has had spinal anesthesia in the past with knee surgeries. Had spinal cord stimulator placed in 2022. Patient advised to bring remote.   Prior complication from anesthesia includes waking up during prior colonoscopy in 2014 although this was not noted in procedure report.  #HTN -Followed by PCP. Controlled on current regimen  #DM #Polyneuropathy -Followed by PCP. Controlled on current regimen. Instructed to hold ozempic for one weeks and synjardy for 72 hours -Last HgA1c on 06/26/22 was 6.9  #Hx of TIA -Previously saw Neurology in 2020-2021. Now just follows with PCP. Takes Plavix and was advised to hold for 5 days. 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  #Hiatal hernia #GERD -On Protonix  #Anxiety -Takes Xanax, Abilify, Lexapro   VS: BP (!) 102/59   Pulse 84   Temp 36.8 C (Oral)   Resp 16   Ht 5\' 6"  (1.676 m)   Wt 88 kg   SpO2 98%   BMI 31.31 kg/m   PROVIDERS: Dettinger, Elige Radon, MD   LABS: Labs reviewed: Acceptable for surgery. (all labs ordered are listed, but only abnormal results are displayed)  Labs Reviewed  BASIC METABOLIC PANEL - Abnormal; Notable for the following components:      Result Value   Sodium 133 (*)    Glucose, Bld 153 (*)    All other components within normal limits  CBC - Abnormal; Notable for the following components:   RBC 5.20 (*)    HCT 46.4 (*)    All other components within normal  limits  GLUCOSE, CAPILLARY - Abnormal; Notable for the following components:   Glucose-Capillary 191 (*)    All other components within normal limits     IMAGES:  CT chest lung cancer screening 10/22/21:  IMPRESSION: 1. Lung-RADS 1, negative. Continue annual screening with low-dose chest CT without contrast in 12 months. 2. Stable circumferential wall thickening in the distal esophagus. Esophagitis would be a consideration. 3.  Emphysema (ICD10-J43.9) and Aortic Atherosclerosis (ICD10-170.0)   EKG 09/05/21:  NSR Poor R wave progression   CV:  Echo 05/04/19:  IMPRESSIONS     1. Left ventricular ejection fraction, by visual estimation, is 60 to  65%. The left ventricle has normal function. There is no left ventricular  hypertrophy.   2. Left ventricular diastolic parameters are consistent with Grade I  diastolic dysfunction (impaired relaxation).   3. The left ventricle has no regional wall motion abnormalities.   4. Global right ventricle has normal systolic function.The right  ventricular size is normal. No increase in right ventricular wall  thickness.   5. Left atrial size was normal.   6. Right atrial size was normal.   7. Mild mitral annular calcification.   8. The mitral valve is grossly normal. No evidence of mitral valve  regurgitation.   9. The tricuspid valve is grossly normal.  10. The tricuspid valve is grossly normal. Tricuspid  valve regurgitation  is trivial.  11. The aortic valve is tricuspid. Aortic valve regurgitation is not  visualized. No evidence of aortic valve sclerosis or stenosis.  12. The pulmonic valve was grossly normal. Pulmonic valve regurgitation is  trivial.  13. Normal pulmonary artery systolic pressure.  14. The inferior vena cava is normal in size with greater than 50%  respiratory variability, suggesting right atrial pressure of 3 mmHg.  15. No PFO (bubble study performed).   Past Medical History:  Diagnosis Date   Allergy     seasonal   Anxiety    Arthritis    [place]    Colon polyps    Complication of anesthesia    per pt, she woke up in the middle of last colon in  2014 and it makes her constipated                   2014.   Depression    Diabetes mellitus, type 2 06/2010   Diabetic neuropathy    Diverticulosis    pt unaware   Esophageal stricture    Fibromyalgia    [place]    GERD (gastroesophageal reflux disease)    Hemorrhoids    Hiatal hernia    Hyperlipidemia    Hypertension    Menopause    Neuropathy due to medical condition    Numbness    Peripheral vascular insufficiency    Retinopathy    Bilateral   TIA (transient ischemic attack)    left clouds in eyes went to [facility] happened after a knee surgery   Tremor    Vitamin D deficiency     Past Surgical History:  Procedure Laterality Date   ABDOMINAL HYSTERECTOMY     partial and then total   ANKLE SURGERY Right    BACK SURGERY     COLONOSCOPY     ELBOW SURGERY     left   KNEE ARTHROSCOPY Right 09/12/2018   Procedure: ARTHROSCOPY KNEE; SYNOVECTOMY;  Surgeon: Ollen Gross, MD;  Location: [facility];  Service: Orthopedics;  Laterality: Right;    PATELLA REVISION Right 09/10/2021   Procedure: Resection arthroplasty right patella;  Surgeon: Ollen Gross, MD;  Location: [facility];  Service: Orthopedics;  Laterality: Right;   REFRACTIVE SURGERY  2003   SPINAL CORD STIMULATOR INSERTION N/A 01/01/2021   Procedure: SPINAL CORD STIMULATOR PLACEMENT;  Surgeon: Venita Lick, MD;  Location: [facility];  Service: Orthopedics;  Laterality: N/A;   TOTAL KNEE ARTHROPLASTY     bilateral   TOTAL KNEE REVISION Right 03/09/2018   Procedure: RIGHT TOTAL KNEE REVISION;  Surgeon: Ollen Gross, MD;  Location: [facility];  Service: Orthopedics;  Laterality: Right;  with abductor block   VESICOVAGINAL FISTULA CLOSURE W/ TAH  1981    MEDICATIONS:  ALPRAZolam (XANAX) 0.5 MG tablet   ARIPiprazole (ABILIFY) 2 MG tablet   atorvastatin (LIPITOR) 10  MG tablet   blood glucose meter kit and supplies KIT   Blood Glucose Monitoring Suppl (ONE TOUCH ULTRA 2) w/Device KIT   Cholecalciferol (VITAMIN D) 50 MCG (2000 UT) tablet   clopidogrel (PLAVIX) 75 MG tablet   conjugated estrogens (PREMARIN) vaginal cream   docusate sodium (COLACE) 100 MG capsule   Empagliflozin-metFORMIN HCl ER (SYNJARDY XR) 25-1000 MG TB24   escitalopram (LEXAPRO) 20 MG tablet   ezetimibe (ZETIA) 10 MG tablet   glipiZIDE (GLUCOTROL XL) 5 MG 24 hr tablet   glucose blood (ONETOUCH ULTRA) test strip   HYDROcodone-acetaminophen (NORCO/VICODIN) 5-325 MG tablet  Lidocaine HCl (ASPERCREME LIDOCAINE) 4 % LIQD   lisinopril-hydrochlorothiazide (ZESTORETIC) 20-25 MG tablet   meclizine (ANTIVERT) 25 MG tablet   methocarbamol (ROBAXIN) 500 MG tablet   nicotine polacrilex (NICORETTE) 2 MG gum   OneTouch Delica Lancets 33G MISC   pantoprazole (PROTONIX) 40 MG tablet   polyethylene glycol (MIRALAX / GLYCOLAX) 17 g packet   Semaglutide, 1 MG/DOSE, (OZEMPIC, 1 MG/DOSE,) 4 MG/3ML SOPN   Tea Tree Oil (AUSTRALIAN TEA TREE) 100 % OIL   No current facility-administered medications for this encounter.    Marcille Blanco [facility] Short Stay/Anesthesiology [facility] Phone [phone] 07/28/2022 3:43 PM

## 2022-07-28 NOTE — Anesthesia Preprocedure Evaluation (Addendum)
Anesthesia Evaluation  Patient identified by MRN, date of birth, ID band Patient awake    Reviewed: Allergy & Precautions, NPO status , Patient's Chart, lab work & pertinent test results  Airway Mallampati: II  TM Distance: >3 FB Neck ROM: Full    Dental  (+) Dental Advisory Given   Pulmonary former smoker   breath sounds clear to auscultation       Cardiovascular hypertension, Pt. on medications + Peripheral Vascular Disease   Rhythm:Regular Rate:Normal     Neuro/Psych S/p spinal cord stimulator  TIA Neuromuscular disease    GI/Hepatic Neg liver ROS, hiatal hernia,GERD  ,,  Endo/Other  diabetes, Type 2    Renal/GU negative Renal ROS     Musculoskeletal  (+) Arthritis ,  Fibromyalgia -  Abdominal   Peds  Hematology negative hematology ROS (+)   Anesthesia Other Findings   Reproductive/Obstetrics                              Lab Results  Component Value Date   WBC 7.9 07/24/2022   HGB 14.8 07/24/2022   HCT 46.4 (H) 07/24/2022   MCV 89.2 07/24/2022   PLT 244 07/24/2022   Lab Results  Component Value Date   CREATININE 0.84 07/24/2022   BUN 13 07/24/2022   NA 133 (L) 07/24/2022   K 4.3 07/24/2022   CL 98 07/24/2022   CO2 23 07/24/2022    Anesthesia Physical Anesthesia Plan  ASA: 3  Anesthesia Plan: General   Post-op Pain Management: Ofirmev IV (intra-op)* and Regional block*   Induction: Intravenous  PONV Risk Score and Plan: 3 and Dexamethasone, Ondansetron, Midazolam and Treatment may vary due to age or medical condition  Airway Management Planned: LMA  Additional Equipment:   Intra-op Plan:   Post-operative Plan: Extubation in OR  Informed Consent: I have reviewed the patients History and Physical, chart, labs and discussed the procedure including the risks, benefits and alternatives for the proposed anesthesia with the patient or authorized representative who  has indicated his/her understanding and acceptance.     Dental advisory given  Plan Discussed with: CRNA  Anesthesia Plan Comments:          Anesthesia Quick Evaluation

## 2022-07-31 ENCOUNTER — Encounter: Payer: Self-pay | Admitting: Family Medicine

## 2022-07-31 ENCOUNTER — Ambulatory Visit (INDEPENDENT_AMBULATORY_CARE_PROVIDER_SITE_OTHER): Payer: Medicare Other | Admitting: Family Medicine

## 2022-07-31 VITALS — BP 115/64 | HR 90 | Temp 98.6°F | Ht 66.0 in | Wt 197.6 lb

## 2022-07-31 DIAGNOSIS — E1143 Type 2 diabetes mellitus with diabetic autonomic (poly)neuropathy: Secondary | ICD-10-CM | POA: Diagnosis not present

## 2022-07-31 DIAGNOSIS — Z7985 Long-term (current) use of injectable non-insulin antidiabetic drugs: Secondary | ICD-10-CM | POA: Diagnosis not present

## 2022-07-31 MED ORDER — PREGABALIN 50 MG PO CAPS
50.0000 mg | ORAL_CAPSULE | Freq: Two times a day (BID) | ORAL | 1 refills | Status: DC
Start: 2022-07-31 — End: 2022-10-01

## 2022-07-31 NOTE — Progress Notes (Signed)
BP 115/64   Pulse 90   Temp 98.6 F (37 C)   Ht 5\' 6"  (1.676 m)   Wt 197 lb 9.6 oz (89.6 kg)   SpO2 97%   BMI 31.89 kg/m    Subjective:   [name], female    DOB: 27-May-1945, 77 y.o.   MRN: [id number]  HPI: [name] is a 77 y.o. female presenting on 07/31/2022 for Medication Problem   HPI Diabetic neuropathy Patient is coming in for diabetic neuropathy.  She has been on gabapentin and tapered off of it and has been doing better off of it, she is no longer having the withdrawals but now she is having the neuropathy at nighttime and she wonders if there is something that she can do to help with it.  She has tried Lyrica in the past and caused swelling.  She has not tried Cymbalta we discussed that but since she is on another antidepressant we would have to transition and she does not know if she wants to transition off of another antidepressant because it has been doing good for her.  Relevant past medical, surgical, family and social history reviewed and updated as indicated. Interim medical history since our last visit reviewed. Allergies and medications reviewed and updated.  Review of Systems  Constitutional:  Negative for chills and fever.  Eyes:  Negative for visual disturbance.  Respiratory:  Negative for chest tightness and shortness of breath.   Cardiovascular:  Negative for chest pain and leg swelling.  Musculoskeletal:  Positive for arthralgias and gait problem.  Neurological:  Positive for numbness. Negative for light-headedness and headaches.  Psychiatric/Behavioral:  Positive for sleep disturbance. Negative for agitation and behavioral problems.   All other systems reviewed and are negative.   Per HPI unless specifically indicated above   Allergies as of 07/31/2022       Reactions   Penicillins Other (See Comments)   Blisters in mouth Did it involve swelling of the face/tongue/throat, SOB, or low BP? No Did it involve sudden or severe  rash/hives, skin peeling, or any reaction on the inside of your mouth or nose? No Did you need to seek medical attention at a hospital or doctor's office? No When did it last happen?      Childhood allergy If all above answers are "NO", may proceed with cephalosporin use.   Codeine Nausea And Vomiting   Erythromycin Nausea And Vomiting   Fenofibrate Swelling   aching & edema    Lyrica [pregabalin] Swelling   Edema in feet   Statins Other (See Comments)   Joints ache   Sulfa Antibiotics Nausea And Vomiting        Medication List        Accurate as of July 31, 2022  4:51 PM. If you have any questions, ask your nurse or doctor.          ALPRAZolam 0.5 MG tablet Commonly known as: XANAX Take 1-2 tablets (0.5-1 mg total) by mouth daily as needed for anxiety. Take 1-2 tabs daily as needed   ARIPiprazole 2 MG tablet Commonly known as: ABILIFY TAKE 1 TABLET DAILY What changed:  how much to take when to take this   Aspercreme Lidocaine 4 % Liqd Generic drug: Lidocaine HCl Apply 1 spray topically at bedtime as needed (foot pain).   atorvastatin 10 MG tablet Commonly known as: LIPITOR Take 1 tablet (10 mg total) by mouth daily. What changed: when to take this  Australian Tea Tree 100 % Oil Apply 1 application  topically at bedtime as needed (foot pain).   blood glucose meter kit and supplies Kit Dispense based on patient and insurance preference. Use up to two times daily as directed. (Dx: type 2 DM - E11.9)   clopidogrel 75 MG tablet Commonly known as: PLAVIX Take 1 tablet (75 mg total) by mouth daily.   docusate sodium 100 MG capsule Commonly known as: COLACE Take 1 capsule (100 mg total) by mouth every 12 (twelve) hours. What changed:  when to take this reasons to take this   escitalopram 20 MG tablet Commonly known as: LEXAPRO Take 1 tablet (20 mg total) by mouth daily.   ezetimibe 10 MG tablet Commonly known as: ZETIA Take 1 tablet (10 mg total) by  mouth daily.   glipiZIDE 5 MG 24 hr tablet Commonly known as: GLUCOTROL XL Take 1 tablet (5 mg total) by mouth daily with breakfast.   HYDROcodone-acetaminophen 5-325 MG tablet Commonly known as: NORCO/VICODIN Take 1 tablet by mouth every 6 (six) hours as needed for moderate pain.   lisinopril-hydrochlorothiazide 20-25 MG tablet Commonly known as: ZESTORETIC Take 1 tablet by mouth daily.   meclizine 25 MG tablet Commonly known as: ANTIVERT TAKE ONE TABLET THREE TIMES DAILY AS NEEDED FOR DIZZINESS Strength: 25 mg   methocarbamol 500 MG tablet Commonly known as: ROBAXIN Take 1 tablet (500 mg total) by mouth every 6 (six) hours as needed for muscle spasms.   nicotine polacrilex 2 MG gum Commonly known as: NICORETTE Take 2 mg by mouth as needed for smoking cessation.   ONE TOUCH ULTRA 2 w/Device Kit CHECK BLOOD SUGAR UP TO TWICE DAILY Dx E11.9   OneTouch Delica Lancets 33G Misc CHECK BLOOD SUGAR UP TO TWICE DAILY Dx E11.9   OneTouch Ultra test strip Generic drug: glucose blood Check BS up to 2 times daily Dx 11.43   Ozempic (1 MG/DOSE) 4 MG/3ML Sopn Generic drug: Semaglutide (1 MG/DOSE) Inject 1 mg into the skin once a week.   pantoprazole 40 MG tablet Commonly known as: PROTONIX Take 1 tablet (40 mg total) by mouth daily.   polyethylene glycol 17 g packet Commonly known as: MIRALAX / GLYCOLAX Take 17 g by mouth 2 (two) times daily as needed (constipation).   pregabalin 50 MG capsule Commonly known as: Lyrica Take 1 capsule (50 mg total) by mouth 2 (two) times daily. Started by: Elige Radon Jodene Polyak, MD   Premarin vaginal cream Generic drug: conjugated estrogens USE VAGINALLY AT BEDTIME AS DIRECTED What changed: See the new instructions.   Synjardy XR 25-1000 MG Tb24 Generic drug: Empagliflozin-metFORMIN HCl ER TAKE 1 TABLET BY MOUTH DAILY   Vitamin D 50 MCG (2000 UT) tablet Take 2,000 Units by mouth every morning.         Objective:   BP 115/64    Pulse 90   Temp 98.6 F (37 C)   Ht 5\' 6"  (1.676 m)   Wt 197 lb 9.6 oz (89.6 kg)   SpO2 97%   BMI 31.89 kg/m   Wt Readings from Last 3 Encounters:  07/31/22 197 lb 9.6 oz (89.6 kg)  07/24/22 194 lb (88 kg)  07/22/22 194 lb (88 kg)    Physical Exam Vitals and nursing note reviewed.  Constitutional:      Appearance: Normal appearance.  Neurological:     Mental Status: She is alert.  Psychiatric:        Mood and Affect: Mood normal.  Behavior: Behavior normal.        Thought Content: Thought content normal.       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 Follow up plan: Return in about 2 months (around 09/30/2022), or if symptoms worsen or fail to improve, for Recheck diabetes and neuropathy.  Counseling provided for all of the vaccine components No orders of the defined types were placed in this encounter.   Arville Care, MD [facility] 07/31/2022, 4:51 PM

## 2022-08-03 ENCOUNTER — Observation Stay (HOSPITAL_COMMUNITY)
Admission: RE | Admit: 2022-08-03 | Discharge: 2022-08-04 | Disposition: A | Discharge status: Home / Self Care | Payer: Medicare Other | Source: Ambulatory Visit | Attending: Orthopedic Surgery | Admitting: Orthopedic Surgery

## 2022-08-03 ENCOUNTER — Ambulatory Visit (HOSPITAL_BASED_OUTPATIENT_CLINIC_OR_DEPARTMENT_OTHER): Payer: Medicare Other | Admitting: Certified Registered Nurse Anesthetist

## 2022-08-03 ENCOUNTER — Encounter (HOSPITAL_COMMUNITY)
Admission: RE | Disposition: A | Discharge status: Home / Self Care | Payer: Self-pay | Source: Ambulatory Visit | Attending: Orthopedic Surgery

## 2022-08-03 ENCOUNTER — Other Ambulatory Visit: Payer: Self-pay

## 2022-08-03 ENCOUNTER — Ambulatory Visit (HOSPITAL_COMMUNITY): Payer: Medicare Other | Admitting: Medical

## 2022-08-03 ENCOUNTER — Encounter (HOSPITAL_COMMUNITY): Payer: Self-pay | Admitting: Orthopedic Surgery

## 2022-08-03 DIAGNOSIS — I10 Essential (primary) hypertension: Secondary | ICD-10-CM | POA: Diagnosis not present

## 2022-08-03 DIAGNOSIS — E119 Type 2 diabetes mellitus without complications: Secondary | ICD-10-CM | POA: Insufficient documentation

## 2022-08-03 DIAGNOSIS — E1142 Type 2 diabetes mellitus with diabetic polyneuropathy: Secondary | ICD-10-CM

## 2022-08-03 DIAGNOSIS — Z8673 Personal history of transient ischemic attack (TIA), and cerebral infarction without residual deficits: Secondary | ICD-10-CM | POA: Insufficient documentation

## 2022-08-03 DIAGNOSIS — Z7984 Long term (current) use of oral hypoglycemic drugs: Secondary | ICD-10-CM | POA: Diagnosis not present

## 2022-08-03 DIAGNOSIS — M8788 Other osteonecrosis, other site: Secondary | ICD-10-CM | POA: Diagnosis not present

## 2022-08-03 DIAGNOSIS — G8918 Other acute postprocedural pain: Secondary | ICD-10-CM | POA: Diagnosis not present

## 2022-08-03 DIAGNOSIS — M25861 Other specified joint disorders, right knee: Secondary | ICD-10-CM | POA: Diagnosis not present

## 2022-08-03 DIAGNOSIS — Z7902 Long term (current) use of antithrombotics/antiplatelets: Secondary | ICD-10-CM | POA: Insufficient documentation

## 2022-08-03 DIAGNOSIS — Z96653 Presence of artificial knee joint, bilateral: Secondary | ICD-10-CM | POA: Insufficient documentation

## 2022-08-03 DIAGNOSIS — M879 Osteonecrosis, unspecified: Secondary | ICD-10-CM | POA: Diagnosis not present

## 2022-08-03 DIAGNOSIS — Z87891 Personal history of nicotine dependence: Secondary | ICD-10-CM | POA: Diagnosis not present

## 2022-08-03 DIAGNOSIS — Z96659 Presence of unspecified artificial knee joint: Secondary | ICD-10-CM

## 2022-08-03 DIAGNOSIS — E1151 Type 2 diabetes mellitus with diabetic peripheral angiopathy without gangrene: Secondary | ICD-10-CM | POA: Diagnosis not present

## 2022-08-03 DIAGNOSIS — T8484XA Pain due to internal orthopedic prosthetic devices, implants and grafts, initial encounter: Secondary | ICD-10-CM | POA: Diagnosis present

## 2022-08-03 DIAGNOSIS — Z79899 Other long term (current) drug therapy: Secondary | ICD-10-CM | POA: Insufficient documentation

## 2022-08-03 DIAGNOSIS — T84018A Broken internal joint prosthesis, other site, initial encounter: Secondary | ICD-10-CM

## 2022-08-03 HISTORY — PX: PATELLECTOMY: SHX1022

## 2022-08-03 LAB — GLUCOSE, CAPILLARY
Glucose-Capillary: 119 mg/dL — ABNORMAL HIGH (ref 70–99)
Glucose-Capillary: 107 mg/dL — ABNORMAL HIGH (ref 70–99)

## 2022-08-03 SURGERY — PATELLECTOMY
Anesthesia: General | Site: Knee | Laterality: Right

## 2022-08-03 MED ORDER — ARIPIPRAZOLE 2 MG PO TABS
1.0000 mg | ORAL_TABLET | Freq: Every day | ORAL | Status: DC
  Administered 2022-08-03: 1 mg via ORAL
  Filled 2022-08-03: qty 1

## 2022-08-03 MED ORDER — ACETAMINOPHEN 10 MG/ML IV SOLN
1000.0000 mg | Freq: Four times a day (QID) | INTRAVENOUS | Status: DC
  Administered 2022-08-03: 1000 mg via INTRAVENOUS
  Filled 2022-08-03: qty 100

## 2022-08-03 MED ORDER — OXYCODONE HCL 5 MG PO TABS
ORAL_TABLET | ORAL | Status: AC
  Filled 2022-08-03: qty 1

## 2022-08-03 MED ORDER — MIDAZOLAM HCL 2 MG/2ML IJ SOLN: 1.0000 mg | Freq: Once | INTRAMUSCULAR | Status: DC

## 2022-08-03 MED ORDER — LACTATED RINGERS IV SOLN: INTRAVENOUS | Status: DC

## 2022-08-03 MED ORDER — FENTANYL CITRATE PF 50 MCG/ML IJ SOSY
PREFILLED_SYRINGE | INTRAMUSCULAR | Status: AC
  Filled 2022-08-03: qty 1

## 2022-08-03 MED ORDER — ACETAMINOPHEN 325 MG PO TABS: 325.0000 mg | ORAL_TABLET | Freq: Four times a day (QID) | ORAL | Status: DC | PRN

## 2022-08-03 MED ORDER — MORPHINE SULFATE (PF) 2 MG/ML IV SOLN
1.0000 mg | INTRAVENOUS | Status: DC | PRN
  Administered 2022-08-03: 2 mg via INTRAVENOUS
  Filled 2022-08-03: qty 1

## 2022-08-03 MED ORDER — ALPRAZOLAM 0.5 MG PO TABS
0.5000 mg | ORAL_TABLET | Freq: Every day | ORAL | Status: DC | PRN
  Administered 2022-08-03: 0.5 mg via ORAL
  Filled 2022-08-03: qty 1

## 2022-08-03 MED ORDER — SODIUM CHLORIDE 0.9 % IV SOLN: INTRAVENOUS | Status: DC

## 2022-08-03 MED ORDER — DEXAMETHASONE SODIUM PHOSPHATE 10 MG/ML IJ SOLN: 10.0000 mg | Freq: Once | INTRAMUSCULAR | Status: DC

## 2022-08-03 MED ORDER — CHLORHEXIDINE GLUCONATE 0.12 % MT SOLN
15.0000 mL | Freq: Once | OROMUCOSAL | Status: AC
  Administered 2022-08-03: 15 mL via OROMUCOSAL

## 2022-08-03 MED ORDER — OXYCODONE HCL 5 MG/5ML PO SOLN: 5.0000 mg | Freq: Once | ORAL | Status: AC | PRN

## 2022-08-03 MED ORDER — OXYCODONE-ACETAMINOPHEN 5-325 MG PO TABS
ORAL_TABLET | ORAL | Status: AC
  Filled 2022-08-03: qty 1

## 2022-08-03 MED ORDER — FENTANYL CITRATE (PF) 100 MCG/2ML IJ SOLN
INTRAMUSCULAR | Status: AC
  Filled 2022-08-03: qty 2

## 2022-08-03 MED ORDER — OXYCODONE HCL 5 MG PO TABS
5.0000 mg | ORAL_TABLET | Freq: Once | ORAL | Status: AC | PRN
  Administered 2022-08-03: 5 mg via ORAL

## 2022-08-03 MED ORDER — METOCLOPRAMIDE HCL 5 MG/ML IJ SOLN: 5.0000 mg | Freq: Three times a day (TID) | INTRAMUSCULAR | Status: DC | PRN

## 2022-08-03 MED ORDER — ESCITALOPRAM OXALATE 20 MG PO TABS
20.0000 mg | ORAL_TABLET | Freq: Every day | ORAL | Status: DC
  Administered 2022-08-04: 20 mg via ORAL
  Filled 2022-08-03: qty 1

## 2022-08-03 MED ORDER — ROPIVACAINE HCL 5 MG/ML IJ SOLN
INTRAMUSCULAR | Status: DC | PRN
  Administered 2022-08-03: 20 mL via PERINEURAL

## 2022-08-03 MED ORDER — CEFAZOLIN SODIUM-DEXTROSE 2-4 GM/100ML-% IV SOLN
2.0000 g | Freq: Four times a day (QID) | INTRAVENOUS | Status: AC
  Administered 2022-08-03 – 2022-08-04 (×2): 2 g via INTRAVENOUS
  Filled 2022-08-03 (×2): qty 100

## 2022-08-03 MED ORDER — DIPHENHYDRAMINE HCL 12.5 MG/5ML PO ELIX: 12.5000 mg | ORAL_SOLUTION | ORAL | Status: DC | PRN

## 2022-08-03 MED ORDER — 0.9 % SODIUM CHLORIDE (POUR BTL) OPTIME
TOPICAL | Status: DC | PRN
  Administered 2022-08-03: 1000 mL

## 2022-08-03 MED ORDER — EZETIMIBE 10 MG PO TABS
10.0000 mg | ORAL_TABLET | Freq: Every day | ORAL | Status: DC
  Administered 2022-08-04: 10 mg via ORAL
  Filled 2022-08-03: qty 1

## 2022-08-03 MED ORDER — LIDOCAINE 2% (20 MG/ML) 5 ML SYRINGE
INTRAMUSCULAR | Status: DC | PRN
  Administered 2022-08-03: 40 mg via INTRAVENOUS

## 2022-08-03 MED ORDER — ONDANSETRON HCL 4 MG/2ML IJ SOLN
INTRAMUSCULAR | Status: DC | PRN
  Administered 2022-08-03: 4 mg via INTRAVENOUS

## 2022-08-03 MED ORDER — FENTANYL CITRATE PF 50 MCG/ML IJ SOSY
25.0000 ug | PREFILLED_SYRINGE | INTRAMUSCULAR | Status: DC | PRN
  Administered 2022-08-03 (×2): 50 ug via INTRAVENOUS

## 2022-08-03 MED ORDER — ONDANSETRON HCL 4 MG PO TABS: 4.0000 mg | ORAL_TABLET | Freq: Four times a day (QID) | ORAL | Status: DC | PRN

## 2022-08-03 MED ORDER — METHOCARBAMOL 500 MG IVPB - SIMPLE MED
500.0000 mg | Freq: Four times a day (QID) | INTRAVENOUS | Status: DC | PRN
  Administered 2022-08-03: 500 mg via INTRAVENOUS

## 2022-08-03 MED ORDER — FLEET ENEMA 7-19 GM/118ML RE ENEM: 1.0000 | ENEMA | Freq: Once | RECTAL | Status: DC | PRN

## 2022-08-03 MED ORDER — ONDANSETRON HCL 4 MG/2ML IJ SOLN: 4.0000 mg | Freq: Four times a day (QID) | INTRAMUSCULAR | Status: DC | PRN

## 2022-08-03 MED ORDER — POLYETHYLENE GLYCOL 3350 17 G PO PACK: 17.0000 g | PACK | Freq: Every day | ORAL | Status: DC | PRN

## 2022-08-03 MED ORDER — CEFAZOLIN SODIUM-DEXTROSE 2-4 GM/100ML-% IV SOLN
2.0000 g | INTRAVENOUS | Status: AC
  Administered 2022-08-03: 2 g via INTRAVENOUS
  Filled 2022-08-03: qty 100

## 2022-08-03 MED ORDER — DEXAMETHASONE SODIUM PHOSPHATE 10 MG/ML IJ SOLN
INTRAMUSCULAR | Status: DC | PRN
  Administered 2022-08-03: 10 mg via INTRAVENOUS

## 2022-08-03 MED ORDER — DOCUSATE SODIUM 100 MG PO CAPS
100.0000 mg | ORAL_CAPSULE | Freq: Two times a day (BID) | ORAL | Status: DC
  Administered 2022-08-03 – 2022-08-04 (×2): 100 mg via ORAL
  Filled 2022-08-03 (×2): qty 1

## 2022-08-03 MED ORDER — PANTOPRAZOLE SODIUM 40 MG PO TBEC
40.0000 mg | DELAYED_RELEASE_TABLET | Freq: Every day | ORAL | Status: DC
  Administered 2022-08-04: 40 mg via ORAL
  Filled 2022-08-03: qty 1

## 2022-08-03 MED ORDER — MENTHOL 3 MG MT LOZG: 1.0000 | LOZENGE | OROMUCOSAL | Status: DC | PRN

## 2022-08-03 MED ORDER — METHOCARBAMOL 500 MG IVPB - SIMPLE MED
INTRAVENOUS | Status: AC
  Filled 2022-08-03: qty 55

## 2022-08-03 MED ORDER — ASPIRIN 81 MG PO CHEW
81.0000 mg | CHEWABLE_TABLET | Freq: Every day | ORAL | Status: DC
  Administered 2022-08-04: 81 mg via ORAL
  Filled 2022-08-03: qty 1

## 2022-08-03 MED ORDER — BISACODYL 10 MG RE SUPP: 10.0000 mg | Freq: Every day | RECTAL | Status: DC | PRN

## 2022-08-03 MED ORDER — AMISULPRIDE (ANTIEMETIC) 5 MG/2ML IV SOLN: 10.0000 mg | Freq: Once | INTRAVENOUS | Status: DC | PRN

## 2022-08-03 MED ORDER — DEXAMETHASONE SODIUM PHOSPHATE 10 MG/ML IJ SOLN: 8.0000 mg | Freq: Once | INTRAMUSCULAR | Status: DC

## 2022-08-03 MED ORDER — PROPOFOL 500 MG/50ML IV EMUL
INTRAVENOUS | Status: DC | PRN
  Administered 2022-08-03: 200 mg via INTRAVENOUS

## 2022-08-03 MED ORDER — PREGABALIN 50 MG PO CAPS: 50.0000 mg | ORAL_CAPSULE | Freq: Two times a day (BID) | ORAL | Status: DC

## 2022-08-03 MED ORDER — CLONIDINE HCL (ANALGESIA) 100 MCG/ML EP SOLN
EPIDURAL | Status: DC | PRN
  Administered 2022-08-03: 50 ug

## 2022-08-03 MED ORDER — PROPOFOL 10 MG/ML IV BOLUS
INTRAVENOUS | Status: AC
  Filled 2022-08-03: qty 20

## 2022-08-03 MED ORDER — FENTANYL CITRATE (PF) 100 MCG/2ML IJ SOLN
INTRAMUSCULAR | Status: DC | PRN
  Administered 2022-08-03 (×2): 25 ug via INTRAVENOUS
  Administered 2022-08-03: 50 ug via INTRAVENOUS

## 2022-08-03 MED ORDER — PHENOL 1.4 % MT LIQD: 1.0000 | OROMUCOSAL | Status: DC | PRN

## 2022-08-03 MED ORDER — STERILE WATER FOR IRRIGATION IR SOLN
Status: DC | PRN
  Administered 2022-08-03: 1000 mL

## 2022-08-03 MED ORDER — METOCLOPRAMIDE HCL 5 MG PO TABS: 5.0000 mg | ORAL_TABLET | Freq: Three times a day (TID) | ORAL | Status: DC | PRN

## 2022-08-03 MED ORDER — HYDROCODONE-ACETAMINOPHEN 5-325 MG PO TABS
1.0000 | ORAL_TABLET | ORAL | Status: DC | PRN
  Administered 2022-08-03 – 2022-08-04 (×3): 2 via ORAL
  Filled 2022-08-03 (×4): qty 2

## 2022-08-03 MED ORDER — FENTANYL CITRATE PF 50 MCG/ML IJ SOSY
50.0000 ug | PREFILLED_SYRINGE | Freq: Once | INTRAMUSCULAR | Status: AC
  Administered 2022-08-03: 50 ug via INTRAVENOUS
  Filled 2022-08-03: qty 2

## 2022-08-03 MED ORDER — HYDROCODONE-ACETAMINOPHEN 7.5-325 MG PO TABS: 1.0000 | ORAL_TABLET | ORAL | Status: DC | PRN

## 2022-08-03 MED ORDER — METHOCARBAMOL 500 MG PO TABS
500.0000 mg | ORAL_TABLET | Freq: Four times a day (QID) | ORAL | Status: DC | PRN
  Administered 2022-08-04: 500 mg via ORAL
  Filled 2022-08-03: qty 1

## 2022-08-03 MED ORDER — ORAL CARE MOUTH RINSE: 15.0000 mL | Freq: Once | OROMUCOSAL | Status: AC

## 2022-08-03 MED ORDER — GLIPIZIDE ER 5 MG PO TB24
5.0000 mg | ORAL_TABLET | Freq: Every day | ORAL | Status: DC
  Administered 2022-08-04: 5 mg via ORAL
  Filled 2022-08-03: qty 1

## 2022-08-03 SURGICAL SUPPLY — 51 items
BAG COUNTER SPONGE SURGICOUNT (BAG) IMPLANT
BAG SPEC THK2 15X12 ZIP CLS (MISCELLANEOUS) ×1
BAG SPNG CNTER NS LX DISP (BAG)
BAG ZIPLOCK 12X15 (MISCELLANEOUS) ×1 IMPLANT
BANDAGE ESMARK 6X9 LF (GAUZE/BANDAGES/DRESSINGS) ×1 IMPLANT
BIT DRILL 2.8X128 (BIT) ×1 IMPLANT
BNDG CMPR 5X6 CHSV STRCH STRL (GAUZE/BANDAGES/DRESSINGS) ×1
BNDG CMPR 9X6 STRL LF SNTH (GAUZE/BANDAGES/DRESSINGS) ×1
BNDG CMPR MED 10X6 ELC LF (GAUZE/BANDAGES/DRESSINGS) ×1
BNDG COHESIVE 6X5 TAN ST LF (GAUZE/BANDAGES/DRESSINGS) ×1 IMPLANT
BNDG ELASTIC 6X10 VLCR STRL LF (GAUZE/BANDAGES/DRESSINGS) IMPLANT
BNDG ESMARK 6X9 LF (GAUZE/BANDAGES/DRESSINGS) ×1
BNDG GAUZE DERMACEA FLUFF 4 (GAUZE/BANDAGES/DRESSINGS) ×1 IMPLANT
BNDG GZE DERMACEA 4 6PLY (GAUZE/BANDAGES/DRESSINGS) ×1
COVER SURGICAL LIGHT HANDLE (MISCELLANEOUS) ×1 IMPLANT
DRSG AQUACEL AG ADV 3.5X10 (GAUZE/BANDAGES/DRESSINGS) IMPLANT
DRSG EMULSION OIL 3X16 NADH (GAUZE/BANDAGES/DRESSINGS) ×1 IMPLANT
DURAPREP 26ML APPLICATOR (WOUND CARE) ×1 IMPLANT
ELECT REM PT RETURN 15FT ADLT (MISCELLANEOUS) ×1 IMPLANT
GAUZE PAD ABD 8X10 STRL (GAUZE/BANDAGES/DRESSINGS) ×1 IMPLANT
GAUZE SPONGE 4X4 12PLY STRL (GAUZE/BANDAGES/DRESSINGS) ×1 IMPLANT
GLOVE BIOGEL M 7.0 STRL (GLOVE) ×1 IMPLANT
GLOVE BIOGEL PI IND STRL 6.5 (GLOVE) ×1 IMPLANT
GLOVE BIOGEL PI IND STRL 8 (GLOVE) ×1 IMPLANT
GLOVE BIOGEL PI IND STRL 8.5 (GLOVE) ×1 IMPLANT
GLOVE ECLIPSE 8.0 STRL XLNG CF (GLOVE) ×2 IMPLANT
GLOVE SURG LX STRL 8.0 MICRO (GLOVE) ×1 IMPLANT
GOWN STRL REUS W/ TWL LRG LVL3 (GOWN DISPOSABLE) ×2 IMPLANT
GOWN STRL REUS W/TWL LRG LVL3 (GOWN DISPOSABLE) ×2
IMMOBILIZER KNEE 20 (SOFTGOODS) ×1
IMMOBILIZER KNEE 20 THIGH 36 (SOFTGOODS) IMPLANT
KIT TURNOVER KIT A (KITS) IMPLANT
MANIFOLD NEPTUNE II (INSTRUMENTS) ×1 IMPLANT
NS IRRIG 1000ML POUR BTL (IV SOLUTION) ×1 IMPLANT
PACK ORTHO EXTREMITY (CUSTOM PROCEDURE TRAY) ×1 IMPLANT
PADDING CAST COTTON 6X4 STRL (CAST SUPPLIES) IMPLANT
PASSER SUT SWANSON 36MM LOOP (INSTRUMENTS) ×1 IMPLANT
PROTECTOR NERVE ULNAR (MISCELLANEOUS) ×1 IMPLANT
SPIKE FLUID TRANSFER (MISCELLANEOUS) ×1 IMPLANT
SPONGE T-LAP 4X18 ~~LOC~~+RFID (SPONGE) IMPLANT
STRIP CLOSURE SKIN 1/2X4 (GAUZE/BANDAGES/DRESSINGS) IMPLANT
SUT ETHIBOND NAB CT1 #1 30IN (SUTURE) ×2 IMPLANT
SUT VIC AB 0 CT1 27 (SUTURE) ×2
SUT VIC AB 0 CT1 27XBRD ANTBC (SUTURE) ×2 IMPLANT
SUT VIC AB 1 CT1 27 (SUTURE) ×1
SUT VIC AB 1 CT1 27XBRD ANTBC (SUTURE) ×1 IMPLANT
SUT VIC AB 2-0 CT1 27 (SUTURE) ×2
SUT VIC AB 2-0 CT1 27XBRD (SUTURE) ×2 IMPLANT
TOWEL OR 17X26 10 PK STRL BLUE (TOWEL DISPOSABLE) ×2 IMPLANT
WATER STERILE IRR 1000ML POUR (IV SOLUTION) ×1 IMPLANT
WRAP KNEE MAXI GEL POST OP (GAUZE/BANDAGES/DRESSINGS) IMPLANT

## 2022-08-03 NOTE — Transfer of Care (Signed)
Immediate Anesthesia Transfer of Care Note  Patient: [name]  Procedure(s) Performed: Procedure(s): RIGHT KNEE PATELLECTOMY (Right)  Patient Location: PACU  Anesthesia Type:General  Level of Consciousness: Alert, Awake, Oriented  Airway & Oxygen Therapy: Patient Spontanous Breathing  Post-op Assessment: Report given to RN  Post vital signs: Reviewed and stable  Last Vitals:  Vitals:   08/03/22 1405 08/03/22 1410  BP: (!) 123/58 122/67  Pulse: 69 68  Resp: 20 17  Temp:    SpO2: 98% 98%    Complications: No apparent anesthesia complications

## 2022-08-03 NOTE — Brief Op Note (Signed)
08/03/2022  3:32 PM  PATIENT:  [name]  77 y.o. female  PRE-OPERATIVE DIAGNOSIS:  right knee patellar osteonecrosis  POST-OPERATIVE DIAGNOSIS:  right knee patellar osteonecrosis  PROCEDURE:  Procedure(s): RIGHT KNEE PATELLECTOMY (Right)  SURGEON:  Surgeon(s) and Role:    Ollen Gross, MD - Primary  PHYSICIAN ASSISTANT:   ASSISTANTS: Leilani Able, PA-C   ANESTHESIA:   regional and general  EBL:  20 mL   BLOOD ADMINISTERED:none  DRAINS: none   LOCAL MEDICATIONS USED:  NONE  COUNTS:  YES  TOURNIQUET:   Total Tourniquet Time Documented: Thigh (Right) - 18 minutes Total: Thigh (Right) - 18 minutes   DICTATION: .Other Dictation: Dictation Number [id number]  PLAN OF CARE: Admit for overnight observation  PATIENT DISPOSITION:  PACU - hemodynamically stable.

## 2022-08-03 NOTE — Plan of Care (Signed)
  Problem: Education: Goal: Knowledge of the prescribed therapeutic regimen will improve Outcome: Progressing   Problem: Activity: Goal: Range of joint motion will improve Outcome: Progressing   Problem: Pain Management: Goal: Pain level will decrease with appropriate interventions Outcome: Progressing   Problem: Safety: Goal: Ability to remain free from injury will improve Outcome: Progressing   

## 2022-08-03 NOTE — Evaluation (Signed)
Physical Therapy Evaluation Patient Details Name: [name] MRN: [id number] DOB: [birth year] Today's Date: 08/03/2022  History of Present Illness  77 yo female presents to therapy s/p R knee patellectomy due to failure of conservative measures as wekk as R TKA revision (2019), removal of patella component R knee (2023). Pt PMH includes but is not limited to DM II< DM neuropathy, diverticulosis, fibromyalgia, HTN, HDL, TIA, tremors, back, L elbow and R ankle sx and spinal cord stimulator placement (2022).  Clinical Impression   RITIKA HELLICKSON is a 77 y.o. female POD 0 s/p R patellectomy. Patient reports IND with mobility at baseline. Patient is now limited by functional impairments (see PT problem list below) and requires S for bed mobility and min guard and cues for transfers. Patient was able to ambulate 60 feet with RW and min guard level of assist. Patient instructed in exercise to facilitate ROM and circulation to manage edema.  Patient will benefit from continued skilled PT interventions to address impairments and progress towards PLOF. Acute PT will follow to progress mobility and stair training in preparation for safe discharge home with family support.       Recommendations for follow up therapy are one component of a multi-disciplinary discharge planning process, led by the attending physician.  Recommendations may be updated based on patient status, additional functional criteria and insurance authorization.  Follow Up Recommendations       Assistance Recommended at Discharge Intermittent Supervision/Assistance  Patient can return home with the following  A little help with walking and/or transfers;A little help with bathing/dressing/bathroom;Assistance with cooking/housework;Assist for transportation;Help with stairs or ramp for entrance    Equipment Recommendations None recommended by PT (pt reports DME in home setting)  Recommendations for Other Services       Functional Status  Assessment Patient has had a recent decline in their functional status and demonstrates the ability to make significant improvements in function in a reasonable and predictable amount of time.     Precautions / Restrictions Precautions Precautions: Knee;Fall Restrictions Weight Bearing Restrictions: No      Mobility  Bed Mobility Overal bed mobility: Needs Assistance Bed Mobility: Supine to Sit     Supine to sit: Supervision     General bed mobility comments: min cues    Transfers Overall transfer level: Needs assistance Equipment used: Rolling walker (2 wheels) Transfers: Sit to/from Stand Sit to Stand: Min guard           General transfer comment: cues for proper UE and AD placement    Ambulation/Gait Ambulation/Gait assistance: Min guard Gait Distance (Feet): 60 Feet Assistive device: Rolling walker (2 wheels) Gait Pattern/deviations: Step-to pattern, Decreased stance time - right, Antalgic Gait velocity: decreased        Stairs            Wheelchair Mobility    Modified Rankin (Stroke Patients Only)       Balance Overall balance assessment: Needs assistance Sitting-balance support: Feet supported Sitting balance-Leahy Scale: Good     Standing balance support: Bilateral upper extremity supported, During functional activity, Reliant on assistive device for balance Standing balance-Leahy Scale: Poor                               Pertinent Vitals/Pain Pain Assessment Pain Assessment: 0-10 Pain Score: 8  Pain Location: R knee Pain Descriptors / Indicators: Burning, Constant, Discomfort, Guarding Pain Intervention(s): Limited activity within patient's tolerance,  Monitored during session, Premedicated before session, RN gave pain meds during session, Ice applied    Home Living Family/patient expects to be discharged to:: Private residence Living Arrangements: Spouse/significant other Available Help at Discharge:  Family;Available 24 hours/day Type of Home: House Home Access: Stairs to enter Entrance Stairs-Rails: None Entrance Stairs-Number of Steps: 2 Alternate Level Stairs-Number of Steps: 14 Home Layout: Two level;Able to live on main level with bedroom/bathroom Home Equipment: Rolling Walker (2 wheels);Shower seat;Cane - single point Additional Comments: sleeps in recliner    Prior Function Prior Level of Function : Independent/Modified Independent             Mobility Comments: no AD, IND with ADLs self care tasks and IADLs       Hand Dominance   Dominant Hand: Right    Extremity/Trunk Assessment        Lower Extremity Assessment Lower Extremity Assessment: RLE deficits/detail RLE Deficits / Details: ankle DF/PF 5/5 RLE Sensation: WNL;history of peripheral neuropathy    Cervical / Trunk Assessment Cervical / Trunk Assessment: Normal  Communication   Communication: No difficulties  Cognition Arousal/Alertness: Awake/alert Behavior During Therapy: WFL for tasks assessed/performed Overall Cognitive Status: Within Functional Limits for tasks assessed                                          General Comments      Exercises Total Joint Exercises Ankle Circles/Pumps: AROM, Both, 20 reps   Assessment/Plan    PT Assessment Patient needs continued PT services  PT Problem List Decreased strength;Decreased range of motion;Decreased activity tolerance;Decreased balance;Decreased mobility;Decreased coordination;Pain       PT Treatment Interventions DME instruction;Gait training;Stair training;Functional mobility training;Therapeutic activities;Therapeutic exercise;Balance training;Neuromuscular re-education;Wheelchair mobility training;Modalities    PT Goals (Current goals can be found in the Care Plan section)  Acute Rehab PT Goals Patient Stated Goal: to be able to walk without pain PT Goal Formulation: With patient Time For Goal Achievement:  08/17/22 Potential to Achieve Goals: Good    Frequency 7X/week     Co-evaluation               AM-PAC PT "6 Clicks" Mobility  Outcome Measure Help needed turning from your back to your side while in a flat bed without using bedrails?: None Help needed moving from lying on your back to sitting on the side of a flat bed without using bedrails?: A Little Help needed moving to and from a bed to a chair (including a wheelchair)?: A Little Help needed standing up from a chair using your arms (e.g., wheelchair or bedside chair)?: A Little Help needed to walk in hospital room?: A Little Help needed climbing 3-5 steps with a railing? : A Lot 6 Click Score: 18    End of Session Equipment Utilized During Treatment: Gait belt Activity Tolerance: Patient tolerated treatment well Patient left: in chair;with call bell/phone within reach;with nursing/sitter in room;with family/visitor present Nurse Communication: Mobility status;Patient requests pain meds PT Visit Diagnosis: Unsteadiness on feet (R26.81);Other abnormalities of gait and mobility (R26.89);Muscle weakness (generalized) (M62.81);Difficulty in walking, not elsewhere classified (R26.2);Pain Pain - Right/Left: Right Pain - part of body: Knee    Time: 6962-9528 PT Time Calculation (min) (ACUTE ONLY): 26 min   Charges:   PT Evaluation $PT Eval Low Complexity: 1 Low PT Treatments $Gait Training: 8-22 mins  Rica Mote, PT   Jacqualyn Posey 08/03/2022, 6:49 PM

## 2022-08-03 NOTE — Discharge Instructions (Signed)
 Frank Aluisio, MD Total Joint Specialist [facility] [address] [place], [place] [address] [phone]  POSTOPERATIVE DIRECTIONS   HOME CARE INSTRUCTIONS  Remove items at home which could result in a fall. This includes throw rugs or furniture in walking pathways.  ICE to the affected knee as much as tolerated. Icing helps control swelling. If the swelling is well controlled you will be more comfortable and rehab easier. Continue to use ice on the knee for pain and swelling from surgery. You may notice swelling that will progress down to the foot and ankle. This is normal after surgery. Elevate the leg when you are not up walking on it.    Continue to use the breathing machine which will help keep your temperature down. It is common for your temperature to cycle up and down following surgery, especially at night when you are not up moving around and exerting yourself. The breathing machine keeps your lungs expanded and your temperature down. Do not place pillow under the operative knee, focus on keeping the knee straight while resting  DIET You may resume your previous home diet once you are discharged from the hospital.  DRESSING / WOUND CARE / SHOWERING Keep your bulky bandage on for 2 days. On the third post-operative day you may remove the Ace bandage and gauze. There is a waterproof adhesive bandage on your skin which will stay in place until your first follow-up appointment. Once you remove this you will not need to place another bandage You may begin showering 3 days following surgery, but do not submerge the incision under water.  ACTIVITY For the first 5 days, the key is rest and control of pain and swelling Do your home exercises twice a day starting on post-operative day 3. On the days you go to physical therapy, just do the home exercises once that day. You should rest, ice and elevate the leg for 50 minutes out of every hour. Get up and  walk/stretch for 10 minutes per hour. After 5 days you can increase your activity slowly as tolerated. Walk with your walker as instructed. Use the walker until you are comfortable transitioning to a cane. Walk with the cane in the opposite hand of the operative leg. You may discontinue the cane once you are comfortable and walking steadily. Avoid periods of inactivity such as sitting longer than an hour when not asleep. This helps prevent blood clots.  You may discontinue the knee immobilizer once you are able to perform a straight leg raise while lying down. You may resume a sexual relationship in one month or when given the OK by your doctor.  You may return to work once you are cleared by your doctor.  Do not drive a car for 6 weeks or until released by your surgeon.  Do not drive while taking narcotics.  TED HOSE STOCKINGS Wear the elastic stockings on both legs for three weeks following surgery during the day. You may remove them at night for sleeping.  WEIGHT BEARING Weight bearing as tolerated with assist device (walker, cane, etc) as directed, use it as long as suggested by your surgeon or therapist, typically at least 4-6 weeks.  POSTOPERATIVE CONSTIPATION PROTOCOL Constipation - defined medically as fewer than three stools per week and severe constipation as less than one stool per week.  One of the most common issues patients have following surgery is constipation.  Even if you have a regular bowel pattern at home, your normal   regimen is likely to be disrupted due to multiple reasons following surgery.  Combination of anesthesia, postoperative narcotics, change in appetite and fluid intake all can affect your bowels.  In order to avoid complications following surgery, here are some recommendations in order to help you during your recovery period.  Colace (docusate) - Pick up an over-the-counter form of Colace or another stool softener and take twice a day as long as you are requiring  postoperative pain medications.  Take with a full glass of water daily.  If you experience loose stools or diarrhea, hold the colace until you stool forms back up. If your symptoms do not get better within 1 week or if they get worse, check with your doctor. Dulcolax (bisacodyl) - Pick up over-the-counter and take as directed by the product packaging as needed to assist with the movement of your bowels.  Take with a full glass of water.  Use this product as needed if not relieved by Colace only.  MiraLax (polyethylene glycol) - Pick up over-the-counter to have on hand. MiraLax is a solution that will increase the amount of water in your bowels to assist with bowel movements.  Take as directed and can mix with a glass of water, juice, soda, coffee, or tea. Take if you go more than two days without a movement. Do not use MiraLax more than once per day. Call your doctor if you are still constipated or irregular after using this medication for 7 days in a row.  If you continue to have problems with postoperative constipation, please contact the office for further assistance and recommendations.  If you experience "the worst abdominal pain ever" or develop nausea or vomiting, please contact the office immediatly for further recommendations for treatment.  ITCHING If you experience itching with your medications, try taking only a single pain pill, or even half a pain pill at a time.  You can also use Benadryl over the counter for itching or also to help with sleep.   MEDICATIONS See your medication summary on the "After Visit Summary" that the nursing staff will review with you prior to discharge.  You may have some home medications which will be placed on hold until you complete the course of blood thinner medication.  It is important for you to complete the blood thinner medication as prescribed by your surgeon.  Continue your approved medications as instructed at time of discharge.  PRECAUTIONS If you  experience chest pain or shortness of breath - call 911 immediately for transfer to the hospital emergency department.  If you develop a fever greater that 101 F, purulent drainage from wound, increased redness or drainage from wound, foul odor from the wound/dressing, or calf pain - CONTACT YOUR SURGEON.                                                   FOLLOW-UP APPOINTMENTS Make sure you keep all of your appointments after your operation with your surgeon and caregivers. You should call the office at the above phone number and make an appointment for approximately two weeks after the date of your surgery or on the date instructed by your surgeon outlined in the "After Visit Summary".  RANGE OF MOTION AND STRENGTHENING EXERCISES  Rehabilitation of the knee is important following a knee injury or an operation. After just a   few days of immobilization, the muscles of the thigh which control the knee become weakened and shrink (atrophy). Knee exercises are designed to build up the tone and strength of the thigh muscles and to improve knee motion. Often times heat used for twenty to thirty minutes before working out will loosen up your tissues and help with improving the range of motion but do not use heat for the first two weeks following surgery. These exercises can be done on a training (exercise) mat, on the floor, on a table or on a bed. Use what ever works the best and is most comfortable for you Knee exercises include:  Leg Lifts - While your knee is still immobilized in a splint or cast, you can do straight leg raises. Lift the leg to 60 degrees, hold for 3 sec, and slowly lower the leg. Repeat 10-20 times 2-3 times daily. Perform this exercise against resistance later as your knee gets better.  Quad and Hamstring Sets - Tighten up the muscle on the front of the thigh (Quad) and hold for 5-10 sec. Repeat this 10-20 times hourly. Hamstring sets are done by pushing the foot backward against an object and  holding for 5-10 sec. Repeat as with quad sets.  Leg Slides: Lying on your back, slowly slide your foot toward your buttocks, bending your knee up off the floor (only go as far as is comfortable). Then slowly slide your foot back down until your leg is flat on the floor again. Angel Wings: Lying on your back spread your legs to the side as far apart as you can without causing discomfort.  A rehabilitation program following serious knee injuries can speed recovery and prevent re-injury in the future due to weakened muscles. Contact your doctor or a physical therapist for more information on knee rehabilitation.   POST-OPERATIVE OPIOID TAPER INSTRUCTIONS: It is important to wean off of your opioid medication as soon as possible. If you do not need pain medication after your surgery it is ok to stop day one. Opioids include: Codeine, Hydrocodone(Norco, Vicodin), Oxycodone(Percocet, oxycontin) and hydromorphone amongst others.  Long term and even short term use of opiods can cause: Increased pain response Dependence Constipation Depression Respiratory depression And more.  Withdrawal symptoms can include Flu like symptoms Nausea, vomiting And more Techniques to manage these symptoms Hydrate well Eat regular healthy meals Stay active Use relaxation techniques(deep breathing, meditating, yoga) Do Not substitute Alcohol to help with tapering If you have been on opioids for less than two weeks and do not have pain than it is ok to stop all together.  Plan to wean off of opioids This plan should start within one week post op of your joint replacement. Maintain the same interval or time between taking each dose and first decrease the dose.  Cut the total daily intake of opioids by one tablet each day Next start to increase the time between doses. The last dose that should be eliminated is the evening dose.   IF YOU ARE TRANSFERRED TO A SKILLED REHAB FACILITY If the patient is transferred to  a skilled rehab facility following release from the hospital, a list of the current medications will be sent to the facility for the patient to continue.  When discharged from the skilled rehab facility, please have the facility set up the patient's Home Health Physical Therapy prior to being released. Also, the skilled facility will be responsible for providing the patient with their medications at time of release from the   facility to include their pain medication, the muscle relaxants, and their blood thinner medication. If the patient is still at the rehab facility at time of the two week follow up appointment, the skilled rehab facility will also need to assist the patient in arranging follow up appointment in our office and any transportation needs.  MAKE SURE YOU:  Understand these instructions.  Get help right away if you are not doing well or get worse.   DENTAL ANTIBIOTICS:  In most cases prophylactic antibiotics for Dental procdeures after total joint surgery are not necessary.  Exceptions are as follows:  1. History of prior total joint infection  2. Severely immunocompromised (Organ Transplant, cancer chemotherapy, Rheumatoid biologic meds such as Humera)  3. Poorly controlled diabetes (A1C &gt; 8.0, blood glucose over 200)  If you have one of these conditions, contact your surgeon for an antibiotic prescription, prior to your dental procedure.    Pick up stool softner and laxative for home use following surgery while on pain medications. Do not submerge incision under water. Please use good hand washing techniques while changing dressing each day. May shower starting three days after surgery. Please use a clean towel to pat the incision dry following showers. Continue to use ice for pain and swelling after surgery. Do not use any lotions or creams on the incision until instructed by your surgeon.  

## 2022-08-03 NOTE — H&P (Signed)
CC- [name] is a 77 y.o. female who presents with right knee pain.  HPI- . Knee Pain: Patient presents with knee pain involving the  right knee. Onset of the symptoms was several months ago. Inciting event:  She had a right Total Knee Arthroplasty Revision on 03/09/18 and did well until last year when she had increased right knee pain and had to have her patella component removed secondary to it becoming loose and she did not have enough bone stock left for a reimplantation of the patellar component. She has had persistent anterior knee pain with fragmentation of the patellar bone and presents for patellectomy .   Past Medical History:  Diagnosis Date   Allergy    seasonal   Anxiety    Arthritis    [place]    Colon polyps    Complication of anesthesia    per pt, she woke up in the middle of last colon in  2014 and it makes her constipated                   2014.   Depression    Diabetes mellitus, type 2 (HCC) 06/2010   Diabetic neuropathy (HCC)    Diverticulosis    pt unaware   Esophageal stricture    Fibromyalgia    [place]    GERD (gastroesophageal reflux disease)    Hemorrhoids    Hiatal hernia    Hyperlipidemia    Hypertension    Menopause    Neuropathy due to medical condition (HCC)    Numbness    Peripheral vascular insufficiency (HCC)    Retinopathy    Bilateral   TIA (transient ischemic attack)    left clouds in eyes went to [facility] happened after a knee surgery   Tremor    Vitamin D deficiency     Past Surgical History:  Procedure Laterality Date   ABDOMINAL HYSTERECTOMY     partial and then total   ANKLE SURGERY Right    BACK SURGERY     COLONOSCOPY     ELBOW SURGERY     left   KNEE ARTHROSCOPY Right 09/12/2018   Procedure: ARTHROSCOPY KNEE; SYNOVECTOMY;  Surgeon: Ollen Gross, MD;  Location: [facility];  Service: Orthopedics;  Laterality: Right;    PATELLA REVISION Right 09/10/2021   Procedure: Resection arthroplasty right patella;  Surgeon:  Ollen Gross, MD;  Location: [facility];  Service: Orthopedics;  Laterality: Right;   REFRACTIVE SURGERY  2003   SPINAL CORD STIMULATOR INSERTION N/A 01/01/2021   Procedure: SPINAL CORD STIMULATOR PLACEMENT;  Surgeon: Venita Lick, MD;  Location: [facility];  Service: Orthopedics;  Laterality: N/A;   TOTAL KNEE ARTHROPLASTY     bilateral   TOTAL KNEE REVISION Right 03/09/2018   Procedure: RIGHT TOTAL KNEE REVISION;  Surgeon: Ollen Gross, MD;  Location: [facility];  Service: Orthopedics;  Laterality: Right;  with abductor block   VESICOVAGINAL FISTULA CLOSURE W/ TAH  1981    Prior to Admission medications   Medication Sig Start Date End Date Taking? Authorizing Provider  ALPRAZolam Prudy Feeler) 0.5 MG tablet Take 1-2 tablets (0.5-1 mg total) by mouth daily as needed for anxiety. Take 1-2 tabs daily as needed 06/29/22  Yes Dettinger, Elige Radon, MD  atorvastatin (LIPITOR) 10 MG tablet Take 1 tablet (10 mg total) by mouth daily. Patient taking differently: Take 10 mg by mouth every Monday, Wednesday, and Friday. 09/24/21  Yes Dettinger, Elige Radon, MD  Cholecalciferol (VITAMIN D) 50 MCG (  2000 UT) tablet Take 2,000 Units by mouth every morning.   Yes [provider]  clopidogrel (PLAVIX) 75 MG tablet Take 1 tablet (75 mg total) by mouth daily. 09/24/21  Yes Dettinger, Elige Radon, MD  conjugated estrogens (PREMARIN) vaginal cream USE VAGINALLY AT BEDTIME AS DIRECTED Patient taking differently: Place 1 applicator vaginally daily as needed (irritation). 02/16/22  Yes Dettinger, Elige Radon, MD  docusate sodium (COLACE) 100 MG capsule Take 1 capsule (100 mg total) by mouth every 12 (twelve) hours. Patient taking differently: Take 100 mg by mouth daily as needed for mild constipation. 01/04/21  Yes Pricilla Loveless, MD  Empagliflozin-metFORMIN HCl ER (SYNJARDY XR) 25-1000 MG TB24 TAKE 1 TABLET BY MOUTH DAILY 07/20/22  Yes Dettinger, Elige Radon, MD  escitalopram (LEXAPRO) 20 MG tablet Take 1 tablet (20 mg total)  by mouth daily. 09/24/21  Yes Dettinger, Elige Radon, MD  ezetimibe (ZETIA) 10 MG tablet Take 1 tablet (10 mg total) by mouth daily. 09/24/21  Yes Dettinger, Elige Radon, MD  glipiZIDE (GLUCOTROL XL) 5 MG 24 hr tablet Take 1 tablet (5 mg total) by mouth daily with breakfast. 06/26/22  Yes Dettinger, Elige Radon, MD  HYDROcodone-acetaminophen (NORCO/VICODIN) 5-325 MG tablet Take 1 tablet by mouth every 6 (six) hours as needed for moderate pain. 08/20/21  Yes [provider]  Lidocaine HCl (ASPERCREME LIDOCAINE) 4 % LIQD Apply 1 spray topically at bedtime as needed (foot pain).   Yes [provider]  lisinopril-hydrochlorothiazide (ZESTORETIC) 20-25 MG tablet Take 1 tablet by mouth daily. 09/24/21  Yes Dettinger, Elige Radon, MD  meclizine (ANTIVERT) 25 MG tablet TAKE ONE TABLET THREE TIMES DAILY AS NEEDED FOR DIZZINESS Strength: 25 mg 09/24/21  Yes Dettinger, Elige Radon, MD  nicotine polacrilex (NICORETTE) 2 MG gum Take 2 mg by mouth as needed for smoking cessation.   Yes [provider]  pantoprazole (PROTONIX) 40 MG tablet Take 1 tablet (40 mg total) by mouth daily. 09/24/21  Yes Dettinger, Elige Radon, MD  Semaglutide, 1 MG/DOSE, (OZEMPIC, 1 MG/DOSE,) 4 MG/3ML SOPN Inject 1 mg into the skin once a week. 06/26/22  Yes Dettinger, Elige Radon, MD  ARIPiprazole (ABILIFY) 2 MG tablet TAKE 1 TABLET DAILY Patient taking differently: Take 1 mg by mouth at bedtime. 06/20/21   Dettinger, Elige Radon, MD  blood glucose meter kit and supplies KIT Dispense based on patient and insurance preference. Use up to two times daily as directed. (Dx: type 2 DM - E11.9) 04/30/16   Ernestina Penna, MD  Blood Glucose Monitoring Suppl (ONE TOUCH ULTRA 2) w/Device KIT CHECK BLOOD SUGAR UP TO TWICE DAILY Dx E11.9 03/25/20   Dettinger, Elige Radon, MD  glucose blood (ONETOUCH ULTRA) test strip Check BS up to 2 times daily Dx 11.43 09/24/21   Dettinger, Elige Radon, MD  methocarbamol (ROBAXIN) 500 MG tablet Take 1 tablet (500 mg total) by  mouth every 6 (six) hours as needed for muscle spasms. 09/11/21   Eartha Inch, PA  OneTouch Delica Lancets 33G MISC CHECK BLOOD SUGAR UP TO TWICE DAILY Dx E11.9 09/24/21   Dettinger, Elige Radon, MD  polyethylene glycol (MIRALAX / GLYCOLAX) 17 g packet Take 17 g by mouth 2 (two) times daily as needed (constipation). 01/04/21   Pricilla Loveless, MD  pregabalin (LYRICA) 50 MG capsule Take 1 capsule (50 mg total) by mouth 2 (two) times daily. 07/31/22   Dettinger, Elige Radon, MD  Tea Tree Oil (AUSTRALIAN TEA TREE) 100 % OIL Apply 1 application  topically at  bedtime as needed (foot pain).    [provider]   Right knee exam antalgic gait, soft tissue tenderness over anterior knee, no warmth or effusion, collateral ligaments intact, range 5-130  Physical Examination: General appearance - alert, well appearing, and in no distress Mental status - alert, oriented to person, place, and time Chest - clear to auscultation, no wheezes, rales or rhonchi, symmetric air entry Heart - normal rate, regular rhythm, normal S1, S2, no murmurs, rubs, clicks or gallops Abdomen - soft, nontender, nondistended, no masses or organomegaly Neurological - alert, oriented, normal speech, no focal findings or movement disorder noted   Asessment/Plan---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 Procedure risks and potential comps discussed with patient who elects to proceed. Goals are decreased pain and increased function with a high likelihood of achieving both

## 2022-08-03 NOTE — Anesthesia Procedure Notes (Signed)
Anesthesia Regional Block: Adductor canal block   Pre-Anesthetic Checklist: , timeout performed,  Correct Patient, Correct Site, Correct Laterality,  Correct Procedure, Correct Position, site marked,  Risks and benefits discussed,  Surgical consent,  Pre-op evaluation,  At surgeon's request and post-op pain management  Laterality: Right  Prep: chloraprep       Needles:  Injection technique: Single-shot  Needle Type: Echogenic Needle     Needle Length: 9cm  Needle Gauge: 21     Additional Needles:   Procedures:,,,, ultrasound used (permanent image in chart),,    Narrative:  Start time: 08/03/2022 1:52 PM End time: 08/03/2022 1:57 PM Injection made incrementally with aspirations every 5 mL.  Performed by: Personally  Anesthesiologist: Marcene Duos, MD

## 2022-08-03 NOTE — Anesthesia Procedure Notes (Signed)
Procedure Name: LMA Insertion Date/Time: 08/03/2022 2:41 PM  Performed by: Basilio Cairo, CRNAPre-anesthesia Checklist: Patient identified, Patient being monitored, Timeout performed, Emergency Drugs available and Suction available Patient Re-evaluated:Patient Re-evaluated prior to induction Oxygen Delivery Method: Circle system utilized Preoxygenation: Pre-oxygenation with 100% oxygen Induction Type: IV induction Ventilation: Mask ventilation without difficulty LMA: LMA inserted LMA Size: 4.0 Tube type: Oral Number of attempts: 1 Placement Confirmation: positive ETCO2 and breath sounds checked- equal and bilateral Tube secured with: Tape Dental Injury: Teeth and Oropharynx as per pre-operative assessment

## 2022-08-03 NOTE — Anesthesia Postprocedure Evaluation (Signed)
Anesthesia Post Note  Patient: [name]  Procedure(s) Performed: RIGHT KNEE PATELLECTOMY (Right: Knee)     Patient location during evaluation: PACU Anesthesia Type: General Level of consciousness: awake and alert Pain management: pain level controlled Vital Signs Assessment: post-procedure vital signs reviewed and stable Respiratory status: spontaneous breathing, nonlabored ventilation, respiratory function stable and patient connected to nasal cannula oxygen Cardiovascular status: blood pressure returned to baseline and stable Postop Assessment: no apparent nausea or vomiting Anesthetic complications: no  No notable events documented.  Last Vitals:  Vitals:   08/03/22 1700 08/03/22 1721  BP: 131/70 125/75  Pulse: 65 67  Resp: 13 18  Temp:  36.4 C  SpO2: 97% 97%    Last Pain:  Vitals:   08/03/22 1820  TempSrc:   PainSc: 9                  Kennieth Rad

## 2022-08-04 ENCOUNTER — Encounter (HOSPITAL_COMMUNITY): Payer: Self-pay | Admitting: Orthopedic Surgery

## 2022-08-04 DIAGNOSIS — Z96653 Presence of artificial knee joint, bilateral: Secondary | ICD-10-CM | POA: Diagnosis not present

## 2022-08-04 DIAGNOSIS — Z8673 Personal history of transient ischemic attack (TIA), and cerebral infarction without residual deficits: Secondary | ICD-10-CM | POA: Diagnosis not present

## 2022-08-04 DIAGNOSIS — E119 Type 2 diabetes mellitus without complications: Secondary | ICD-10-CM | POA: Diagnosis not present

## 2022-08-04 DIAGNOSIS — M8788 Other osteonecrosis, other site: Secondary | ICD-10-CM | POA: Diagnosis not present

## 2022-08-04 DIAGNOSIS — Z7902 Long term (current) use of antithrombotics/antiplatelets: Secondary | ICD-10-CM | POA: Diagnosis not present

## 2022-08-04 DIAGNOSIS — Z79899 Other long term (current) drug therapy: Secondary | ICD-10-CM | POA: Diagnosis not present

## 2022-08-04 DIAGNOSIS — I10 Essential (primary) hypertension: Secondary | ICD-10-CM | POA: Diagnosis not present

## 2022-08-04 DIAGNOSIS — Z7984 Long term (current) use of oral hypoglycemic drugs: Secondary | ICD-10-CM | POA: Diagnosis not present

## 2022-08-04 LAB — BASIC METABOLIC PANEL
Anion gap: 11 (ref 5–15)
BUN: 13 mg/dL (ref 8–23)
CO2: 21 mmol/L — ABNORMAL LOW (ref 22–32)
Calcium: 8.6 mg/dL — ABNORMAL LOW (ref 8.9–10.3)
Chloride: 99 mmol/L (ref 98–111)
Creatinine, Ser: 0.75 mg/dL (ref 0.44–1.00)
GFR, Estimated: >60 mL/min (ref >60)
Glucose, Bld: 370 mg/dL — ABNORMAL HIGH (ref 70–99)
Potassium: 4.3 mmol/L (ref 3.5–5.1)
Sodium: 131 mmol/L — ABNORMAL LOW (ref 135–145)

## 2022-08-04 LAB — CBC
HCT: 39.4 % (ref 36.0–46.0)
Hemoglobin: 12.5 g/dL (ref 12.0–15.0)
MCH: 28.8 pg (ref 26.0–34.0)
MCHC: 31.7 g/dL (ref 30.0–36.0)
MCV: 90.8 fL (ref 80.0–100.0)
Platelets: 213 10*3/uL (ref 150–400)
RBC: 4.34 MIL/uL (ref 3.87–5.11)
RDW: 13.7 % (ref 11.5–15.5)
WBC: 9.1 10*3/uL (ref 4.0–10.5)
nRBC: 0 % (ref 0.0–0.2)

## 2022-08-04 MED ORDER — HYDROCODONE-ACETAMINOPHEN 5-325 MG PO TABS: 1.0000 | ORAL_TABLET | Freq: Four times a day (QID) | ORAL | 0 refills | Status: DC | PRN

## 2022-08-04 MED ORDER — ASPIRIN 81 MG PO CHEW: 81.0000 mg | CHEWABLE_TABLET | Freq: Every day | ORAL | 0 refills | Status: AC

## 2022-08-04 MED ORDER — METHOCARBAMOL 500 MG PO TABS: 500.0000 mg | ORAL_TABLET | Freq: Four times a day (QID) | ORAL | 0 refills | Status: DC | PRN

## 2022-08-04 NOTE — Progress Notes (Signed)
   Subjective: 1 Day Post-Op Procedure(s) (LRB): RIGHT KNEE PATELLECTOMY (Right) Patient reports pain as mild.   Patient seen in rounds by Dr. Lequita Halt. Patient is doing great, is ready to go home.No issues overnight.  Objective: Vital signs in last 24 hours: Temp:  [97.6 F (36.4 C)-98.2 F (36.8 C)] 97.6 F (36.4 C) (04/30 0503) Pulse Rate:  [62-99] 99 (04/30 0503) Resp:  [8-20] 17 (04/30 0503) BP: (122-154)/(58-97) 143/70 (04/30 0503) SpO2:  [92 %-100 %] 95 % (04/30 0503) Weight:  [89.6 kg] 89.6 kg (04/29 1721)  Intake/Output from previous day:  Intake/Output Summary (Last 24 hours) at 08/04/2022 0855 Last data filed at 08/04/2022 0600 Gross per 24 hour  Intake 2145.81 ml  Output 1320 ml  Net 825.81 ml    Intake/Output this shift: No intake/output data recorded.  Labs: Recent Labs    08/04/22 0352  HGB 12.5   Recent Labs    08/04/22 0352  WBC 9.1  RBC 4.34  HCT 39.4  PLT 213   Recent Labs    08/04/22 0352  NA 131*  K 4.3  CL 99  CO2 21*  BUN 13  CREATININE 0.75  GLUCOSE 370*  CALCIUM 8.6*   No results for input(s): "LABPT", "INR" in the last 72 hours.  Exam: General - Patient is Alert and Oriented Extremity - Neurologically intact Neurovascular intact Sensation intact distally Dorsiflexion/Plantar flexion intact Dressing/Incision - clean, dry, no drainage Motor Function - intact, moving foot and toes well on exam.   Past Medical History:  Diagnosis Date   Allergy    seasonal   Anxiety    Arthritis    [place]    Colon polyps    Complication of anesthesia    per pt, she woke up in the middle of last colon in  2014 and it makes her constipated                   2014.   Depression    Diabetes mellitus, type 2 (HCC) 06/2010   Diabetic neuropathy (HCC)    Diverticulosis    pt unaware   Esophageal stricture    Fibromyalgia    [place]    GERD (gastroesophageal reflux disease)    Hemorrhoids    Hiatal hernia    Hyperlipidemia     Hypertension    Menopause    Neuropathy due to medical condition (HCC)    Numbness    Peripheral vascular insufficiency (HCC)    Retinopathy    Bilateral   TIA (transient ischemic attack)    left clouds in eyes went to [facility] happened after a knee surgery   Tremor    Vitamin D deficiency     Assessment/Plan: 1 Day Post-Op Procedure(s) (LRB): RIGHT KNEE PATELLECTOMY (Right) Principal Problem:   Failed total knee arthroplasty (HCC) Active Problems:   Painful total knee replacement, right (HCC)  Estimated body mass index is 31.88 kg/m as calculated from the following:   Height as of this encounter: 5\' 6"  (1.676 m).   Weight as of this encounter: 89.6 kg. Up with therapy  DVT Prophylaxis - Aspirin Weight-bearing as tolerated  Discharge this AM. No formal PT needed at this time, may arrange at 2 week f/u appt if needed.   The PDMP database was reviewed today prior to any opioid medications being prescribed to this patient.   Arther Abbott, PA-C Orthopedic Surgery [phone] 08/04/2022, 8:55 AM

## 2022-08-04 NOTE — Progress Notes (Signed)
Physical Therapy Treatment Patient Details Name: [name] MRN: [id number] DOB: 09/24/1945 Today's Date: 08/04/2022   History of Present Illness 77 yo female presents to therapy s/p R knee patellectomy due to failure of conservative measures as wekk as R TKA revision (2019), removal of patella component R knee (2023). Pt PMH includes but is not limited to DM II< DM neuropathy, diverticulosis, fibromyalgia, HTN, HDL, TIA, tremors, back, L elbow and R ankle sx and spinal cord stimulator placement (2022).    PT Comments    POD # 1 General Comments: AxO x 3 very pleasant and motivated to D/C to home today Assisted OOB to amb in hallway and practice stairs with Spouse. Then returned to room to perform some TE's following HEP handout.  Instructed on proper tech, freq as well as use of ICE.   Addressed all mobility questions, discussed appropriate activity, educated on use of ICE.  Pt ready for D/C to home.   Recommendations for follow up therapy are one component of a multi-disciplinary discharge planning process, led by the attending physician.  Recommendations may be updated based on patient status, additional functional criteria and insurance authorization.  Follow Up Recommendations       Assistance Recommended at Discharge Intermittent Supervision/Assistance  Patient can return home with the following A little help with walking and/or transfers;A little help with bathing/dressing/bathroom;Assistance with cooking/housework;Assist for transportation;Help with stairs or ramp for entrance   Equipment Recommendations       Recommendations for Other Services       Precautions / Restrictions Precautions Precautions: Knee;Fall Precaution Comments: no pillow under knee     Mobility  Bed Mobility Overal bed mobility: Modified Independent             General bed mobility comments: self able with increased time    Transfers Overall transfer level: Needs assistance Equipment  used: Rolling walker (2 wheels) Transfers: Sit to/from Stand Sit to Stand: Modified independent (Device/Increase time), Supervision           General transfer comment: self able with good use of hands to steady self    Ambulation/Gait Ambulation/Gait assistance: Modified independent (Device/Increase time), Supervision Gait Distance (Feet): 85 Feet Assistive device: Rolling walker (2 wheels) Gait Pattern/deviations: Step-through pattern Gait velocity: decreased     General Gait Details: tolerated an increased distance with a step through gait pattern and minimal pain.  Had Spouse "hands on" asisst using safety belt.   Stairs Stairs: Yes Stairs assistance: Supervision, Min guard Stair Management: No rails, Step to pattern, Forwards, With walker Number of Stairs: 2 General stair comments: pt has 2 steps with NO RAILS.  Instructed on up forward with walker while Spouse "hands on" securesd walker from behind.  Performed twice.   Wheelchair Mobility    Modified Rankin (Stroke Patients Only)       Balance                                            Cognition Arousal/Alertness: Awake/alert Behavior During Therapy: WFL for tasks assessed/performed Overall Cognitive Status: Within Functional Limits for tasks assessed                                 General Comments: AxO x 3 very pleasant and motivated to D/C to home today  Exercises General Exercises - Lower Extremity Ankle Circles/Pumps: AROM, Both, 10 reps, Supine Quad Sets: AROM, Both, 5 reps, Supine Short Arc Quad: AROM, Supine, Right, 5 reps Long Arc Quad: AROM, Supine, Right, 5 reps Heel Slides: AROM, Right, AAROM, 5 reps, Supine Hip ABduction/ADduction: AROM, Right, 5 reps, Supine Straight Leg Raises: AROM, Right, 5 reps, Supine    General Comments        Pertinent Vitals/Pain Pain Assessment Pain Assessment: 0-10 Pain Score: 4  Pain Location: R knee Pain  Descriptors / Indicators: Burning, Constant, Discomfort, Guarding Pain Intervention(s): Monitored during session, Premedicated before session, Repositioned, Ice applied    Home Living                          Prior Function            PT Goals (current goals can now be found in the care plan section) Progress towards PT goals: Progressing toward goals    Frequency    7X/week      PT Plan Current plan remains appropriate    Co-evaluation              AM-PAC PT "6 Clicks" Mobility   Outcome Measure  Help needed turning from your back to your side while in a flat bed without using bedrails?: None Help needed moving from lying on your back to sitting on the side of a flat bed without using bedrails?: None Help needed moving to and from a bed to a chair (including a wheelchair)?: None Help needed standing up from a chair using your arms (e.g., wheelchair or bedside chair)?: None Help needed to walk in hospital room?: A Little Help needed climbing 3-5 steps with a railing? : A Little 6 Click Score: 22    End of Session Equipment Utilized During Treatment: Gait belt Activity Tolerance: Patient tolerated treatment well Patient left: in bed;with call bell/phone within reach;with family/visitor present Nurse Communication: Mobility status PT Visit Diagnosis: Unsteadiness on feet (R26.81);Other abnormalities of gait and mobility (R26.89);Muscle weakness (generalized) (M62.81);Difficulty in walking, not elsewhere classified (R26.2);Pain Pain - Right/Left: Right Pain - part of body: Knee     Time: 1610-9604 PT Time Calculation (min) (ACUTE ONLY): 25 min  Charges:  $Gait Training: 8-22 mins $Therapeutic Exercise: 8-22 mins                     Felecia Shelling  PTA Acute  Rehabilitation Services Office M-F          [phone]

## 2022-08-04 NOTE — Op Note (Signed)
NAME: [name], HELMES MEDICAL RECORD NO: [id number] ACCOUNT NO: [id number] DATE OF BIRTH: 11/02/1945 FACILITY: [facility] LOCATION: [id number] PHYSICIAN: Gus Rankin. Lavone Barrientes, MD  Operative Report   DATE OF PROCEDURE: 08/03/2022  PREOPERATIVE DIAGNOSIS:  Right knee patellar osteonecrosis.  POSTOPERATIVE DIAGNOSIS:  Right knee patellar osteonecrosis.  PROCEDURE:  Right knee patellectomy.  SURGEON:  Gus Rankin. Euretha Najarro, MD  ASSISTANT:  Leilani Able, PA-C  ANESTHESIA:  General and adductor canal block.  ESTIMATED BLOOD LOSS:  20 mL  DRAINS:  None.  TOURNIQUET TIME:  18 minutes at 300 mmHg.  COMPLICATIONS:  None.  CONDITION:  Stable to recovery.  BRIEF CLINICAL NOTE:  The patient is a 77 year old female who had a right total knee arthroplasty revision done about 5 years ago.  Approximately 2 years ago, she started developing increased pain, which appeared to be consistent with patellar clunk  syndrome.  She had an arthroscopy at which time it was noted that the patellar component was loose.  We removed the component and there was not enough bone stock left for revision.  She has had progressively worsening anterior knee pain and some  fragmentation of her patellar shell.  She presents now for patellectomy in order to help alleviate the anterior knee pain.  DESCRIPTION OF PROCEDURE:  After successful administration of an adductor canal block, then general anesthetic, a tourniquet was placed on her right thigh and right lower extremity prepped and draped in the usual sterile fashion.  Extremity was wrapped  in Esmarch, tourniquet inflated to 300 mmHg.  Midline incision was made with a 10 blade through subcutaneous tissue to the level of the extensor mechanism.  A fresh blade was used to make a medial arthrotomy.  There was a fair amount of hypertrophic  synovial tissue at the junction of the superior pole of the patella and the quadriceps tendon.  This was excised with a knife.  There was a moderately  thick layer of tissue that was overlying the patella on the articular surface.  I carefully incised  that and folded it back to serve as a flap.  I was then able to identify the junction of the patellar bone and the anterior retinacular tissues.  I carefully peeled back the tissues from the bone and was able to remove the fragment of bone without any  tissue loss.  I was then able to repair that flat from the articular surface back to the main body of the extensor mechanism and that was repaired with the interrupted #2 FiberWire sutures.  This was felt to be very stable repair.  I then thoroughly  irrigated the joint with saline solution and then we closed the arthrotomy with the running 0 Stratafix suture.  Flexion against gravity was about 110 degrees.  The wound was copiously irrigated with saline solution.  The subcutaneous tissue was then  closed with 2-0 Vicryl and subcuticular running 4-0 Monocryl.  Incisions cleaned and dried and Steri-Strips and a sterile dressing applied.  She was subsequently awakened and transported to recovery in stable condition.   VAI D: 08/03/2022 3:38:21 pm T: 08/04/2022 1:28:00 am  JOB: [id number]/ [id number]

## 2022-08-04 NOTE — Plan of Care (Signed)

## 2022-08-04 NOTE — TOC Transition Note (Signed)
Transition of Care [facility]) - CM/SW Discharge Note  Patient Details  Name: [name] MRN: [id number] Date of Birth: 21-Mar-1946  Transition of Care [facility]) CM/SW Contact:  Ewing Schlein, LCSW Phone Number: 08/04/2022, 10:50 AM  Clinical Narrative: Patient is expected to discharge home after working with PT. CSW met with patient and spouse to confirm discharge plan. Per patient, she will not start PT until after her follow up appointment with ortho. Patient has a rolling walker, 3N1, and cane at home so there are no DME needs at this time. TOC signing off.    Final next level of care: Home/Self Care Barriers to Discharge: No Barriers Identified  Patient Goals and CMS Choice Choice offered to / list presented to : NA  Discharge Plan and Services Additional resources added to the After Visit Summary for      DME Arranged: N/A DME Agency: NA  Social Determinants of Health (SDOH) Interventions SDOH Screenings   Food Insecurity: No Food Insecurity (08/03/2022)  Housing: Low Risk  (08/03/2022)  Transportation Needs: No Transportation Needs (08/03/2022)  Utilities: Not At Risk (08/03/2022)  Alcohol Screen: Low Risk  (07/16/2021)  Depression (PHQ2-9): Medium Risk (06/29/2022)  Financial Resource Strain: Low Risk  (07/16/2021)  Physical Activity: Inactive (07/16/2021)  Social Connections: Socially Integrated (07/16/2021)  Stress: No Stress Concern Present (07/16/2021)  Tobacco Use: Medium Risk (08/04/2022)   Readmission Risk Interventions    09/11/2021    9:28 AM  Readmission Risk Prevention Plan  Post Dischage Appt Complete  Medication Screening Complete  Transportation Screening Complete

## 2022-08-05 NOTE — Discharge Summary (Signed)
[name] MRN: [id number] DOB/AGE: 1945-12-17 77 y.o.  Admit date: 08/03/2022 Discharge date: 08/04/2022  Admission Diagnoses:  Principal Problem:   Failed total knee arthroplasty [facility]) Active Problems:   Painful total knee replacement, right [facility])   Discharge Diagnoses:  Same  Past Medical History:  Diagnosis Date   Allergy    seasonal   Anxiety    Arthritis    [place]    Colon polyps    Complication of anesthesia    per pt, she woke up in the middle of last colon in  2014 and it makes her constipated                   2014.   Depression    Diabetes mellitus, type 2 (HCC) 06/2010   Diabetic neuropathy (HCC)    Diverticulosis    pt unaware   Esophageal stricture    Fibromyalgia    [place]    GERD (gastroesophageal reflux disease)    Hemorrhoids    Hiatal hernia    Hyperlipidemia    Hypertension    Menopause    Neuropathy due to medical condition (HCC)    Numbness    Peripheral vascular insufficiency (HCC)    Retinopathy    Bilateral   TIA (transient ischemic attack)    left clouds in eyes went to [facility] happened after a knee surgery   Tremor    Vitamin D deficiency     Surgeries: Procedure(s): RIGHT KNEE PATELLECTOMY on 08/03/2022   Consultants:   Discharged Condition: Improved  Hospital Course: [name] is an 77 y.o. female who was admitted 08/03/2022 for operative treatment ofFailed total knee arthroplasty (HCC). Patient has severe unremitting pain that affects sleep, daily activities, and work/hobbies. After pre-op clearance the patient was taken to the operating room on 08/03/2022 and underwent  Procedure(s): RIGHT KNEE PATELLECTOMY.    Patient was given perioperative antibiotics:  Anti-infectives (From admission, onward)    Start     Dose/Rate Route Frequency Ordered Stop   08/03/22 2100  ceFAZolin (ANCEF) IVPB 2g/100 mL premix        2 g 200 mL/hr over 30 Minutes Intravenous Every 6 hours 08/03/22 1719 08/04/22 0335    08/03/22 1315  ceFAZolin (ANCEF) IVPB 2g/100 mL premix        2 g 200 mL/hr over 30 Minutes Intravenous On call to O.R. 08/03/22 1302 08/03/22 1506        Patient was given sequential compression devices, early ambulation, and chemoprophylaxis to prevent DVT.  Patient benefited maximally from hospital stay and there were no complications.    Recent vital signs: No data found.   Recent laboratory studies:  Recent Labs    08/04/22 0352  WBC 9.1  HGB 12.5  HCT 39.4  PLT 213  NA 131*  K 4.3  CL 99  CO2 21*  BUN 13  CREATININE 0.75  GLUCOSE 370*  CALCIUM 8.6*     Discharge Medications:   Allergies as of 08/04/2022       Reactions   Penicillins Other (See Comments)   Blisters in mouth Did it involve swelling of the face/tongue/throat, SOB, or low BP? No Did it involve sudden or severe rash/hives, skin peeling, or any reaction on the inside of your mouth or nose? No Did you need to seek medical attention at a hospital or doctor's office? No When did it last happen?      Childhood allergy If all above answers are "  NO", may proceed with cephalosporin use. Tolerated Cephalosporin Date: 08/04/22.   Codeine Nausea And Vomiting   Erythromycin Nausea And Vomiting   Fenofibrate Swelling   aching & edema    Lyrica [pregabalin] Swelling   Edema in feet   Statins Other (See Comments)   Joints ache   Sulfa Antibiotics Nausea And Vomiting        Medication List     TAKE these medications    ALPRAZolam 0.5 MG tablet Commonly known as: XANAX Take 1-2 tablets (0.5-1 mg total) by mouth daily as needed for anxiety. Take 1-2 tabs daily as needed   ARIPiprazole 2 MG tablet Commonly known as: ABILIFY TAKE 1 TABLET DAILY What changed:  how much to take when to take this   Aspercreme Lidocaine 4 % Liqd Generic drug: Lidocaine HCl Apply 1 spray topically at bedtime as needed (foot pain).   aspirin 81 MG chewable tablet Chew 1 tablet (81 mg total) by mouth daily for 20  days.   atorvastatin 10 MG tablet Commonly known as: LIPITOR Take 1 tablet (10 mg total) by mouth daily. What changed: when to take this   [place] Tea Tree 100 % Oil Apply 1 application  topically at bedtime as needed (foot pain).   blood glucose meter kit and supplies Kit Dispense based on patient and insurance preference. Use up to two times daily as directed. (Dx: type 2 DM - E11.9)   docusate sodium 100 MG capsule Commonly known as: COLACE Take 1 capsule (100 mg total) by mouth every 12 (twelve) hours. What changed:  when to take this reasons to take this   escitalopram 20 MG tablet Commonly known as: LEXAPRO Take 1 tablet (20 mg total) by mouth daily.   ezetimibe 10 MG tablet Commonly known as: ZETIA Take 1 tablet (10 mg total) by mouth daily.   glipiZIDE 5 MG 24 hr tablet Commonly known as: GLUCOTROL XL Take 1 tablet (5 mg total) by mouth daily with breakfast.   HYDROcodone-acetaminophen 5-325 MG tablet Commonly known as: NORCO/VICODIN Take 1-2 tablets by mouth every 6 (six) hours as needed for moderate pain or severe pain. What changed:  how much to take reasons to take this   lisinopril-hydrochlorothiazide 20-25 MG tablet Commonly known as: ZESTORETIC Take 1 tablet by mouth daily.   meclizine 25 MG tablet Commonly known as: ANTIVERT TAKE ONE TABLET THREE TIMES DAILY AS NEEDED FOR DIZZINESS Strength: 25 mg   methocarbamol 500 MG tablet Commonly known as: ROBAXIN Take 1 tablet (500 mg total) by mouth every 6 (six) hours as needed for muscle spasms.   nicotine polacrilex 2 MG gum Commonly known as: NICORETTE Take 2 mg by mouth as needed for smoking cessation.   ONE TOUCH ULTRA 2 w/Device Kit CHECK BLOOD SUGAR UP TO TWICE DAILY Dx E11.9   OneTouch Delica Lancets 33G Misc CHECK BLOOD SUGAR UP TO TWICE DAILY Dx E11.9   OneTouch Ultra test strip Generic drug: glucose blood Check BS up to 2 times daily Dx 11.43   Ozempic (1 MG/DOSE) 4 MG/3ML  Sopn Generic drug: Semaglutide (1 MG/DOSE) Inject 1 mg into the skin once a week.   pantoprazole 40 MG tablet Commonly known as: PROTONIX Take 1 tablet (40 mg total) by mouth daily.   polyethylene glycol 17 g packet Commonly known as: MIRALAX / GLYCOLAX Take 17 g by mouth 2 (two) times daily as needed (constipation).   pregabalin 50 MG capsule Commonly known as: Lyrica Take 1 capsule (50 mg  total) by mouth 2 (two) times daily.   Premarin vaginal cream Generic drug: conjugated estrogens USE VAGINALLY AT BEDTIME AS DIRECTED What changed: See the new instructions.   Synjardy XR 25-1000 MG Tb24 Generic drug: Empagliflozin-metFORMIN HCl ER TAKE 1 TABLET BY MOUTH DAILY   Vitamin D 50 MCG (2000 UT) tablet Take 2,000 Units by mouth every morning.        Diagnostic Studies: No results found.  Disposition: Discharge disposition: 01-Home or Self Care          Follow-up Information     Ollen Gross, MD. Schedule an appointment as soon as possible for a visit in 2 week(s).   Specialty: Orthopedic Surgery Contact information: [address] [place] [address] [place] [address] [phone]                  Signed: Arther Abbott 08/05/2022, 11:20 AM

## 2022-08-30 ENCOUNTER — Other Ambulatory Visit: Payer: Self-pay | Admitting: Family Medicine

## 2022-08-30 DIAGNOSIS — F320 Major depressive disorder, single episode, mild: Secondary | ICD-10-CM

## 2022-09-15 ENCOUNTER — Telehealth: Payer: Self-pay | Admitting: Acute Care

## 2022-09-15 NOTE — Telephone Encounter (Signed)
PT can not make CT Lung Screen appt. She will be on vacation. Please call to resched. [phone]

## 2022-09-16 NOTE — Telephone Encounter (Signed)
Left message for pt to call back to reschedule lung screening CT.  

## 2022-09-21 NOTE — Telephone Encounter (Signed)
Attempted to contact pt but call went directly to VM. Left detailed message with our direct call back number for pt to call if she still needs to reschedule her lung screening CT.

## 2022-09-22 DIAGNOSIS — Z471 Aftercare following joint replacement surgery: Secondary | ICD-10-CM | POA: Diagnosis not present

## 2022-09-29 DIAGNOSIS — M546 Pain in thoracic spine: Secondary | ICD-10-CM | POA: Diagnosis not present

## 2022-09-29 DIAGNOSIS — Z96653 Presence of artificial knee joint, bilateral: Secondary | ICD-10-CM | POA: Diagnosis not present

## 2022-09-29 DIAGNOSIS — Z1231 Encounter for screening mammogram for malignant neoplasm of breast: Secondary | ICD-10-CM | POA: Diagnosis not present

## 2022-09-29 DIAGNOSIS — M5136 Other intervertebral disc degeneration, lumbar region: Secondary | ICD-10-CM | POA: Diagnosis not present

## 2022-09-30 ENCOUNTER — Other Ambulatory Visit: Payer: Self-pay | Admitting: Family Medicine

## 2022-10-01 ENCOUNTER — Encounter: Payer: Self-pay | Admitting: Family Medicine

## 2022-10-01 ENCOUNTER — Ambulatory Visit (INDEPENDENT_AMBULATORY_CARE_PROVIDER_SITE_OTHER): Payer: Medicare Other | Admitting: Family Medicine

## 2022-10-01 VITALS — BP 116/69 | HR 80 | Ht 66.0 in | Wt 191.0 lb

## 2022-10-01 DIAGNOSIS — I10 Essential (primary) hypertension: Secondary | ICD-10-CM | POA: Diagnosis not present

## 2022-10-01 DIAGNOSIS — E1142 Type 2 diabetes mellitus with diabetic polyneuropathy: Secondary | ICD-10-CM

## 2022-10-01 DIAGNOSIS — F32A Depression, unspecified: Secondary | ICD-10-CM

## 2022-10-01 DIAGNOSIS — E1143 Type 2 diabetes mellitus with diabetic autonomic (poly)neuropathy: Secondary | ICD-10-CM

## 2022-10-01 DIAGNOSIS — E11610 Type 2 diabetes mellitus with diabetic neuropathic arthropathy: Secondary | ICD-10-CM

## 2022-10-01 DIAGNOSIS — Z79899 Other long term (current) drug therapy: Secondary | ICD-10-CM

## 2022-10-01 DIAGNOSIS — Z7985 Long-term (current) use of injectable non-insulin antidiabetic drugs: Secondary | ICD-10-CM

## 2022-10-01 DIAGNOSIS — F320 Major depressive disorder, single episode, mild: Secondary | ICD-10-CM

## 2022-10-01 DIAGNOSIS — Z7984 Long term (current) use of oral hypoglycemic drugs: Secondary | ICD-10-CM

## 2022-10-01 LAB — BAYER DCA HB A1C WAIVED: HB A1C (BAYER DCA - WAIVED): 6.8 % — ABNORMAL HIGH (ref 4.8–5.6)

## 2022-10-01 MED ORDER — ESCITALOPRAM OXALATE 20 MG PO TABS: 20.0000 mg | ORAL_TABLET | Freq: Every day | ORAL | 0 refills | Status: DC

## 2022-10-01 MED ORDER — GLIPIZIDE ER 5 MG PO TB24: 5.0000 mg | ORAL_TABLET | Freq: Every day | ORAL | 3 refills | Status: DC

## 2022-10-01 MED ORDER — LISINOPRIL-HYDROCHLOROTHIAZIDE 20-25 MG PO TABS: 1.0000 | ORAL_TABLET | Freq: Every day | ORAL | 3 refills | Status: DC

## 2022-10-01 MED ORDER — PANTOPRAZOLE SODIUM 40 MG PO TBEC: 40.0000 mg | DELAYED_RELEASE_TABLET | Freq: Every day | ORAL | 3 refills | Status: DC

## 2022-10-01 MED ORDER — PREGABALIN 50 MG PO CAPS: 50.0000 mg | ORAL_CAPSULE | Freq: Two times a day (BID) | ORAL | 1 refills | Status: DC

## 2022-10-01 MED ORDER — ARIPIPRAZOLE 2 MG PO TABS: 2.0000 mg | ORAL_TABLET | Freq: Every day | ORAL | 3 refills | Status: DC

## 2022-10-01 MED ORDER — SYNJARDY XR 25-1000 MG PO TB24
1.0000 | ORAL_TABLET | Freq: Every day | ORAL | 0 refills | Status: DC
Start: 2022-10-01 — End: 2022-10-29

## 2022-10-01 MED ORDER — ALPRAZOLAM 0.5 MG PO TABS
0.5000 mg | ORAL_TABLET | Freq: Every day | ORAL | 2 refills | Status: AC | PRN
Start: 2022-10-01 — End: ?

## 2022-10-01 MED ORDER — EZETIMIBE 10 MG PO TABS: 10.0000 mg | ORAL_TABLET | Freq: Every day | ORAL | 3 refills | Status: DC

## 2022-10-01 MED ORDER — OZEMPIC (1 MG/DOSE) 4 MG/3ML ~~LOC~~ SOPN: 1.0000 mg | PEN_INJECTOR | SUBCUTANEOUS | 3 refills | Status: DC

## 2022-10-01 MED ORDER — ATORVASTATIN CALCIUM 10 MG PO TABS: 10.0000 mg | ORAL_TABLET | ORAL | 3 refills | Status: DC

## 2022-10-01 NOTE — Progress Notes (Signed)
BP 116/69   Pulse 80   Ht 5\' 6"  (1.676 m)   Wt 191 lb (86.6 kg)   SpO2 95%   BMI 30.83 kg/m    Subjective:   [name], female    DOB: May 18, 1945, 77 y.o.   MRN: [id number]  HPI: [name] is a 77 y.o. female presenting on 10/01/2022 for Medical Management of Chronic Issues and Diabetes   HPI Type 2 diabetes mellitus Patient comes in today for recheck of his diabetes. Patient has been currently taking Synjardy and glipizide and Ozempic. Patient is currently on an ACE inhibitor/ARB. Patient has not seen an ophthalmologist this year. Patient denies any new issues with their feet. The symptom started onset as an adult hypertension and neuropathy ARE RELATED TO DM   Hypertension Patient is currently on lisinopril hydrochlorothiazide, and their blood pressure today is 168/69. Patient denies any lightheadedness or dizziness. Patient denies headaches, blurred vision, chest pains, shortness of breath, or weakness. Denies any side effects from medication and is content with current medication.   Depression recheck Patient is coming in for depression recheck.  She currently uses Lexapro and Abilify and then alprazolam.  She is also on Lyrica for myalgias.  Current rx-alprazolam 0.5 mg daily as needed for anxiety and Lyrica 50 mg twice daily # meds rx-30 alprazolam and 60 Lyrica per month Effectiveness of current meds-working well except for her depression is not as good [place] with her recent knee replacement being stuck at home and not being active or mobile. Adverse reactions form meds-none  Pill count performed-No Last drug screen -01/15/2021 ( high risk q87m, moderate risk q57m, low risk yearly ) Urine drug screen today- No Was the NCCSR reviewed- yes  If yes were their any concerning findings? - none  No flowsheet data found.   Controlled substance contract signed on: 01/15/2021  Relevant past medical, surgical, family and social history reviewed and updated  as indicated. Interim medical history since our last visit reviewed. Allergies and medications reviewed and updated.  Review of Systems  Constitutional:  Negative for chills and fever.  Eyes:  Negative for redness and visual disturbance.  Respiratory:  Negative for chest tightness and shortness of breath.   Cardiovascular:  Negative for chest pain and leg swelling.  Genitourinary:  Negative for difficulty urinating and dysuria.  Musculoskeletal:  Positive for arthralgias and gait problem.  Skin:  Negative for rash.  Neurological:  Negative for dizziness, light-headedness and headaches.  Psychiatric/Behavioral:  Positive for dysphoric mood and sleep disturbance. Negative for agitation, behavioral problems, self-injury and suicidal ideas. The patient is nervous/anxious.   All other systems reviewed and are negative.   Per HPI unless specifically indicated above   Allergies as of 10/01/2022       Reactions   Penicillins Other (See Comments)   Blisters in mouth Did it involve swelling of the face/tongue/throat, SOB, or low BP? No Did it involve sudden or severe rash/hives, skin peeling, or any reaction on the inside of your mouth or nose? No Did you need to seek medical attention at a hospital or doctor's office? No When did it last happen?      Childhood allergy If all above answers are "NO", may proceed with cephalosporin use. Tolerated Cephalosporin Date: 08/04/22.   Codeine Nausea And Vomiting   Erythromycin Nausea And Vomiting   Fenofibrate Swelling   aching & edema    Lyrica [pregabalin] Swelling   Edema in feet   Statins  Other (See Comments)   Joints ache   Sulfa Antibiotics Nausea And Vomiting        Medication List        Accurate as of October 01, 2022  9:45 AM. If you have any questions, ask your nurse or doctor.          ALPRAZolam 0.5 MG tablet Commonly known as: XANAX Take 1-2 tablets (0.5-1 mg total) by mouth daily as needed for anxiety. Take 1-2 tabs  daily as needed   ARIPiprazole 2 MG tablet Commonly known as: ABILIFY Take 1 tablet (2 mg total) by mouth daily. What changed:  how much to take when to take this   Aspercreme Lidocaine 4 % Liqd Generic drug: Lidocaine HCl Apply 1 spray topically at bedtime as needed (foot pain).   atorvastatin 10 MG tablet Commonly known as: LIPITOR Take 1 tablet (10 mg total) by mouth every Monday, Wednesday, and Friday. Start taking on: October 02, 2022   Australian Tea Tree 100 % Oil Apply 1 application  topically at bedtime as needed (foot pain).   blood glucose meter kit and supplies Kit Dispense based on patient and insurance preference. Use up to two times daily as directed. (Dx: type 2 DM - E11.9)   docusate sodium 100 MG capsule Commonly known as: COLACE Take 1 capsule (100 mg total) by mouth every 12 (twelve) hours. What changed:  when to take this reasons to take this   escitalopram 20 MG tablet Commonly known as: LEXAPRO Take 1 tablet (20 mg total) by mouth daily.   ezetimibe 10 MG tablet Commonly known as: ZETIA Take 1 tablet (10 mg total) by mouth daily.   glipiZIDE 5 MG 24 hr tablet Commonly known as: GLUCOTROL XL Take 1 tablet (5 mg total) by mouth daily with breakfast.   HYDROcodone-acetaminophen 10-325 MG tablet Commonly known as: NORCO Take 1 tablet by mouth every 6 (six) hours as needed for moderate pain (Right knee replaced). What changed: Another medication with the same name was removed. Continue taking this medication, and follow the directions you see here. Changed by: Elige Radon Charlen Bakula, MD   lisinopril-hydrochlorothiazide 20-25 MG tablet Commonly known as: ZESTORETIC Take 1 tablet by mouth daily.   meclizine 25 MG tablet Commonly known as: ANTIVERT TAKE ONE TABLET THREE TIMES DAILY AS NEEDED FOR DIZZINESS Strength: 25 mg   methocarbamol 500 MG tablet Commonly known as: ROBAXIN Take 1 tablet (500 mg total) by mouth every 6 (six) hours as needed for  muscle spasms.   nicotine polacrilex 2 MG gum Commonly known as: NICORETTE Take 2 mg by mouth as needed for smoking cessation.   ONE TOUCH ULTRA 2 w/Device Kit CHECK BLOOD SUGAR UP TO TWICE DAILY Dx E11.9   OneTouch Delica Lancets 33G Misc CHECK BLOOD SUGAR UP TO TWICE DAILY Dx E11.9   OneTouch Ultra test strip Generic drug: glucose blood Check BS up to 2 times daily Dx 11.43   Ozempic (1 MG/DOSE) 4 MG/3ML Sopn Generic drug: Semaglutide (1 MG/DOSE) Inject 1 mg into the skin once a week.   pantoprazole 40 MG tablet Commonly known as: PROTONIX Take 1 tablet (40 mg total) by mouth daily.   polyethylene glycol 17 g packet Commonly known as: MIRALAX / GLYCOLAX Take 17 g by mouth 2 (two) times daily as needed (constipation).   pregabalin 50 MG capsule Commonly known as: Lyrica Take 1 capsule (50 mg total) by mouth 2 (two) times daily.   Premarin vaginal cream Generic drug:  conjugated estrogens USE VAGINALLY AT BEDTIME AS DIRECTED What changed: See the new instructions.   Synjardy XR 25-1000 MG Tb24 Generic drug: Empagliflozin-metFORMIN HCl ER Take 1 tablet by mouth daily.   Vitamin D 50 MCG (2000 UT) tablet Take 2,000 Units by mouth every morning.         Objective:   BP 116/69   Pulse 80   Ht 5\' 6"  (1.676 m)   Wt 191 lb (86.6 kg)   SpO2 95%   BMI 30.83 kg/m   Wt Readings from Last 3 Encounters:  10/01/22 191 lb (86.6 kg)  08/03/22 197 lb 8.5 oz (89.6 kg)  07/31/22 197 lb 9.6 oz (89.6 kg)    Physical Exam Vitals and nursing note reviewed.  Constitutional:      General: She is not in acute distress.    Appearance: She is well-developed. She is not diaphoretic.  Eyes:     Conjunctiva/sclera: Conjunctivae normal.  Cardiovascular:     Rate and Rhythm: Normal rate and regular rhythm.     Heart sounds: Normal heart sounds. No murmur heard. Pulmonary:     Effort: Pulmonary effort is normal. No respiratory distress.     Breath sounds: Normal breath sounds.  No wheezing.  Musculoskeletal:        General: No tenderness. Normal range of motion.  Skin:    General: Skin is warm and dry.     Findings: No rash.  Neurological:     Mental Status: She is alert and oriented to person, place, and time.     Coordination: Coordination normal.  Psychiatric:        Mood and Affect: Mood is anxious and depressed.        Behavior: Behavior normal.        Thought Content: Thought content does not include suicidal ideation. Thought content does not include suicidal plan.       Assessment & Plan:   Problem List Items Addressed This Visit       Cardiovascular and Mediastinum   Essential hypertension, benign (Chronic)   Relevant Medications   lisinopril-hydrochlorothiazide (ZESTORETIC) 20-25 MG tablet   ezetimibe (ZETIA) 10 MG tablet   atorvastatin (LIPITOR) 10 MG tablet (Start on 10/02/2022)     Endocrine   Diabetes (HCC) - Primary (Chronic)   Relevant Medications   lisinopril-hydrochlorothiazide (ZESTORETIC) 20-25 MG tablet   glipiZIDE (GLUCOTROL XL) 5 MG 24 hr tablet   ezetimibe (ZETIA) 10 MG tablet   Empagliflozin-metFORMIN HCl ER (SYNJARDY XR) 25-1000 MG TB24   atorvastatin (LIPITOR) 10 MG tablet (Start on 10/02/2022)   Semaglutide, 1 MG/DOSE, (OZEMPIC, 1 MG/DOSE,) 4 MG/3ML SOPN   Other Relevant Orders   Bayer DCA Hb A1c Waived   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HCC)   Relevant Medications   ALPRAZolam (XANAX) 0.5 MG tablet   pregabalin (LYRICA) 50 MG capsule   lisinopril-hydrochlorothiazide (ZESTORETIC) 20-25 MG tablet   glipiZIDE (GLUCOTROL XL) 5 MG 24 hr tablet   ezetimibe (ZETIA) 10 MG tablet   escitalopram (LEXAPRO) 20 MG tablet   Empagliflozin-metFORMIN HCl ER (SYNJARDY XR) 25-1000 MG TB24   atorvastatin (LIPITOR) 10 MG tablet (Start on 10/02/2022)   ARIPiprazole (ABILIFY) 2 MG tablet   Semaglutide, 1 MG/DOSE, (OZEMPIC, 1 MG/DOSE,) 4 MG/3ML SOPN   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)    Relevant Medications   pregabalin (LYRICA) 50 MG capsule   lisinopril-hydrochlorothiazide (ZESTORETIC) 20-25 MG tablet   glipiZIDE (GLUCOTROL XL) 5 MG 24 hr tablet  Empagliflozin-metFORMIN HCl ER (SYNJARDY XR) 25-1000 MG TB24   atorvastatin (LIPITOR) 10 MG tablet (Start on 10/02/2022)   Semaglutide, 1 MG/DOSE, (OZEMPIC, 1 MG/DOSE,) 4 MG/3ML SOPN     Other   Depression   Relevant Medications   ALPRAZolam (XANAX) 0.5 MG tablet   escitalopram (LEXAPRO) 20 MG tablet   ARIPiprazole (ABILIFY) 2 MG tablet   Other Visit Diagnoses     Controlled substance agreement signed       Relevant Medications   ALPRAZolam (XANAX) 0.5 MG tablet       Anxiety has increased recently and she is going to try going back to a whole pill of the Abilify in the evenings and see if that helps in the meantime.  She says a lot of it stems from insect at home after her knee replacement on the right knee.  She is doing rehab so she can get more active but it is not coming as quick as she would like.  A1c was 6.8 today which looks good.  No change in medication. Follow up plan: Return in about 3 months (around 01/01/2023), or if symptoms worsen or fail to improve, for Diabetes recheck.  Counseling provided for all of the vaccine components Orders Placed This Encounter  Procedures   Bayer DCA Hb A1c Waived    Arville Care, MD [facility] 10/01/2022, 9:45 AM

## 2022-10-02 ENCOUNTER — Encounter: Payer: Self-pay | Admitting: Family Medicine

## 2022-10-02 ENCOUNTER — Other Ambulatory Visit: Payer: Self-pay | Admitting: Family Medicine

## 2022-10-07 ENCOUNTER — Telehealth: Payer: Self-pay

## 2022-10-07 NOTE — Telephone Encounter (Signed)
Patient Advocate Encounter  Prior Authorization for ARIPiprazole 2MG  tablets has been approved with [employer].    PA# [id number] Effective dates: 10/07/22 through 10/07/23

## 2022-10-07 NOTE — Telephone Encounter (Signed)
Answered clinical questions. Status pending.

## 2022-10-07 NOTE — Telephone Encounter (Signed)
Ashok Norris (Key: [facility]) Rx #: [id number] Need Help? Call us at [phone] Status sent iconSent to Plan today Drug ARIPiprazole 2MG  tablets ePA cloud logo Form [facility] Electronic Request Form Original Claim Info [phone] MD CALL [phone]

## 2022-10-23 ENCOUNTER — Ambulatory Visit (HOSPITAL_COMMUNITY): Payer: Medicare Other

## 2022-10-28 ENCOUNTER — Other Ambulatory Visit: Payer: Self-pay | Admitting: Family Medicine

## 2022-10-28 DIAGNOSIS — E1142 Type 2 diabetes mellitus with diabetic polyneuropathy: Secondary | ICD-10-CM

## 2022-10-28 DIAGNOSIS — E11610 Type 2 diabetes mellitus with diabetic neuropathic arthropathy: Secondary | ICD-10-CM

## 2022-10-28 DIAGNOSIS — E1143 Type 2 diabetes mellitus with diabetic autonomic (poly)neuropathy: Secondary | ICD-10-CM

## 2022-10-29 ENCOUNTER — Telehealth: Payer: Self-pay | Admitting: Family Medicine

## 2022-10-29 ENCOUNTER — Other Ambulatory Visit: Payer: Self-pay

## 2022-10-29 DIAGNOSIS — Z87891 Personal history of nicotine dependence: Secondary | ICD-10-CM

## 2022-10-29 DIAGNOSIS — Z122 Encounter for screening for malignant neoplasm of respiratory organs: Secondary | ICD-10-CM

## 2022-10-29 NOTE — Telephone Encounter (Signed)
CT ordered. Pt aware

## 2022-10-29 NOTE — Telephone Encounter (Signed)
Pt says that she needs a new order for low dose CT scan lung cancer screening. The one she had has expired.

## 2022-10-30 DIAGNOSIS — Z471 Aftercare following joint replacement surgery: Secondary | ICD-10-CM | POA: Diagnosis not present

## 2022-11-12 ENCOUNTER — Other Ambulatory Visit: Payer: Self-pay

## 2022-11-12 ENCOUNTER — Ambulatory Visit: Payer: Medicare Other | Attending: Orthopedic Surgery | Admitting: Physical Therapy

## 2022-11-12 DIAGNOSIS — M25561 Pain in right knee: Secondary | ICD-10-CM | POA: Insufficient documentation

## 2022-11-12 DIAGNOSIS — R600 Localized edema: Secondary | ICD-10-CM | POA: Insufficient documentation

## 2022-11-12 DIAGNOSIS — M6281 Muscle weakness (generalized): Secondary | ICD-10-CM | POA: Insufficient documentation

## 2022-11-12 DIAGNOSIS — G8929 Other chronic pain: Secondary | ICD-10-CM | POA: Insufficient documentation

## 2022-11-12 DIAGNOSIS — M25661 Stiffness of right knee, not elsewhere classified: Secondary | ICD-10-CM | POA: Diagnosis not present

## 2022-11-12 NOTE — Therapy (Signed)
OUTPATIENT PHYSICAL THERAPY LOWER EXTREMITY EVALUATION   Patient Name: [name] MRN: [id number] DOB:October 02, 1945, 77 y.o., female Today's Date: 11/12/2022  END OF SESSION:  PT End of Session - 11/12/22 1153     Visit Number 1    Number of Visits 8    Date for PT Re-Evaluation 12/09/22    PT Start Time 1018    PT Stop Time 1102    PT Time Calculation (min) 44 min    Activity Tolerance Patient tolerated treatment well    Behavior During Therapy [facility] for tasks assessed/performed             Past Medical History:  Diagnosis Date   Allergy    seasonal   Anxiety    Arthritis    [place]    Colon polyps    Complication of anesthesia    per pt, she woke up in the middle of last colon in  2014 and it makes her constipated                   2014.   Depression    Diabetes mellitus, type 2 (HCC) 06/2010   Diabetic neuropathy (HCC)    Diverticulosis    pt unaware   Esophageal stricture    Fibromyalgia    [place]    GERD (gastroesophageal reflux disease)    Hemorrhoids    Hiatal hernia    Hyperlipidemia    Hypertension    Menopause    Neuropathy due to medical condition (HCC)    Numbness    Peripheral vascular insufficiency (HCC)    Retinopathy    Bilateral   TIA (transient ischemic attack)    left clouds in eyes went to [facility] happened after a knee surgery   Tremor    Vitamin D deficiency    Past Surgical History:  Procedure Laterality Date   ABDOMINAL HYSTERECTOMY     partial and then total   ANKLE SURGERY Right    BACK SURGERY     COLONOSCOPY     ELBOW SURGERY     left   KNEE ARTHROSCOPY Right 09/12/2018   Procedure: ARTHROSCOPY KNEE; SYNOVECTOMY;  Surgeon: Ollen Gross, MD;  Location: [facility];  Service: Orthopedics;  Laterality: Right;    PATELLA REVISION Right 09/10/2021   Procedure: Resection arthroplasty right patella;  Surgeon: Ollen Gross, MD;  Location: [facility];  Service: Orthopedics;  Laterality: Right;   PATELLECTOMY Right 08/03/2022    Procedure: RIGHT KNEE PATELLECTOMY;  Surgeon: Ollen Gross, MD;  Location: [facility];  Service: Orthopedics;  Laterality: Right;   REFRACTIVE SURGERY  2003   SPINAL CORD STIMULATOR INSERTION N/A 01/01/2021   Procedure: SPINAL CORD STIMULATOR PLACEMENT;  Surgeon: Venita Lick, MD;  Location: [facility];  Service: Orthopedics;  Laterality: N/A;   TOTAL KNEE ARTHROPLASTY     bilateral   TOTAL KNEE REVISION Right 03/09/2018   Procedure: RIGHT TOTAL KNEE REVISION;  Surgeon: Ollen Gross, MD;  Location: [facility];  Service: Orthopedics;  Laterality: Right;  with abductor block   VESICOVAGINAL FISTULA CLOSURE W/ TAH  1981   Patient Active Problem List   Diagnosis Date Noted   Painful total knee replacement, right (HCC) 08/03/2022   Chronic pain 01/01/2021   Paresthesia 06/14/2019   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) 06/14/2019   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   Aortic atherosclerosis (HCC) 12/08/2017   Hardening of the aorta (main artery of the heart) (HCC) 12/08/2017  History of total knee replacement, bilateral 11/04/2017   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HCC) 01/14/2015   Depression 02/28/2013   GERD (gastroesophageal reflux disease) 02/28/2013   Hiatal hernia with gastroesophageal reflux 02/28/2013   DDD (degenerative disc disease), lumbosacral 06/23/2012   Fibromyalgia syndrome 06/23/2012   Essential hypertension, benign 06/23/2012   Osteopenia 06/23/2012   Diabetes (HCC) 06/23/2012   Obesity due to excess calories 10/29/2010   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   Personal history of colonic polyps 10/11/2009     REFERRING PROVIDER: Ollen Gross MD  REFERRING DIAG: S/p right patellectomy  THERAPY DIAG:  Chronic pain of right knee - Plan: PT plan of care cert/re-cert  Stiffness of right knee, not elsewhere classified - Plan: PT plan of care cert/re-cert  Localized edema - Plan: PT plan of care  cert/re-cert  Muscle weakness (generalized) - Plan: PT plan of care cert/re-cert  Rationale for Evaluation and Treatment: Rehabilitation  ONSET DATE: 08/03/22 (surgery date).  SUBJECTIVE:   SUBJECTIVE STATEMENT: The patient presents to the clinic s/p right patellectomy performed on 08/03/22.  She does reports her pain is better when sitting but she continues to have pain with walking and performing ADL's.  In fact, she states she is limited to about 4-5 minutes of walking before she has to sit down.  She uses a scooter for shopping.   There are some ADL's she cannot perform due to pain.    PERTINENT HISTORY: Multiple prior right knee surgeries.  Lumbar stimulators. PAIN:  Are you having pain? Yes: NPRS scale: 6/10 Pain location: Right knee. Pain description: Ache. Aggravating factors: As above Relieving factors: As above.  PRECAUTIONS: Other: No ultrasound.   WEIGHT BEARING RESTRICTIONS: No  FALLS:  Has patient fallen in last 6 months? Yes. Number of falls 1.  LIVING ENVIRONMENT: Lives with: lives with their spouse Lives in: House/apartment Has following equipment at home: None  OCCUPATION: Retired.  PLOF: Needs assistance with ADLs  PATIENT GOALS: Be able to walk longer and perform all ADL's.    OBJECTIVE:    PATIENT SURVEYS:  FOTO .  EDEMA:  Circumferential: 1.5 cms greater on right than left.   PALPATION: No significant palpable tenderness of right knee.  LOWER EXTREMITY ROM:  -15 degrees of active right knee extension and active flexion to 130 degrees.  LOWER EXTREMITY MMT:    Notable right quadriceps atrophy and decreased volitional contraction per contralateral comparison with right hip flexion is 4-/5, abduction 4/5 and quad strength is a 4/5.  GAIT: Slow and cautious gait without assistive device.   TODAY'S TREATMENT:                                                                                                                               DATE: VMS (4 electrodes) to patient's right quadriceps x 15 minutes for neuro re-education with 10 sec extension holds f/b a 10 sec rest.  Performing SAQ's.   PATIENT EDUCATION:  HOME EXERCISE PROGRAM: ASSESSMENT:  CLINICAL IMPRESSION: The patient presents to [facility] s/p right patellectomy.  She lacks 15 degrees of extension but her flexion is excellent.  Her CC is limited walking and performing ADL's due to pain.  She has minimal edema.  Her right quadriceps are remarkable for significant atrophy.  She is lacking right LE strength.  Patient will benefit from skilled physical therapy intervention to address pain and deficits.  OBJECTIVE IMPAIRMENTS: Abnormal gait, decreased activity tolerance, difficulty walking, decreased ROM, increased edema, and pain.   ACTIVITY LIMITATIONS: carrying, lifting, bending, standing, and locomotion level  PARTICIPATION LIMITATIONS: meal prep, cleaning, laundry, and shopping  PERSONAL FACTORS: Time since onset of injury/illness/exacerbation and 1 comorbidity: multiple right knee surgeries  are also affecting patient's functional outcome.   REHAB POTENTIAL: Good  CLINICAL DECISION MAKING: Stable/uncomplicated  EVALUATION COMPLEXITY: Low   GOALS:  SHORT TERM GOALS: Target date: 12/10/22 Ind with a HEP. Goal status: INITIAL  2.  Solid right hip and knee strength to 4+/5 for greater ease in performing ADL's.  Goal status: INITIAL  3.  Walk a community distance with pain not > 4/10.  Goal status: INITIAL  4.  Perform all ADL's with pain not > 4/10.  Goal status: INITIAL    PLAN:  PT FREQUENCY: 2x/week  PT DURATION: 4 weeks  PLANNED INTERVENTIONS: Therapeutic exercises, Therapeutic activity, Neuromuscular re-education, Balance training, Gait training, Patient/Family education, Self Care, Cryotherapy, Moist heat, Vasopneumatic device, and Manual therapy  PLAN FOR NEXT SESSION: Right LE strengthening.     , [place], PT 11/12/2022,  12:09 PM

## 2022-11-17 ENCOUNTER — Ambulatory Visit: Payer: Medicare Other | Admitting: *Deleted

## 2022-11-17 DIAGNOSIS — M25661 Stiffness of right knee, not elsewhere classified: Secondary | ICD-10-CM

## 2022-11-17 DIAGNOSIS — R600 Localized edema: Secondary | ICD-10-CM

## 2022-11-17 DIAGNOSIS — G8929 Other chronic pain: Secondary | ICD-10-CM | POA: Diagnosis not present

## 2022-11-17 DIAGNOSIS — M6281 Muscle weakness (generalized): Secondary | ICD-10-CM

## 2022-11-17 DIAGNOSIS — M25561 Pain in right knee: Secondary | ICD-10-CM | POA: Diagnosis not present

## 2022-11-17 NOTE — Therapy (Signed)
OUTPATIENT PHYSICAL THERAPY LOWER EXTREMITY EVALUATION   Patient Name: [name] MRN: [id number] DOB:23-Sep-1945, 77 y.o., female Today's Date: 11/17/2022  END OF SESSION:  PT End of Session - 11/17/22 1051     Visit Number 2    Number of Visits 8    Date for PT Re-Evaluation 12/09/22    PT Start Time 1015    PT Stop Time 1105    PT Time Calculation (min) 50 min             Past Medical History:  Diagnosis Date   Allergy    seasonal   Anxiety    Arthritis    [place]    Colon polyps    Complication of anesthesia    per pt, she woke up in the middle of last colon in  2014 and it makes her constipated                   2014.   Depression    Diabetes mellitus, type 2 (HCC) 06/2010   Diabetic neuropathy (HCC)    Diverticulosis    pt unaware   Esophageal stricture    Fibromyalgia    [place]    GERD (gastroesophageal reflux disease)    Hemorrhoids    Hiatal hernia    Hyperlipidemia    Hypertension    Menopause    Neuropathy due to medical condition (HCC)    Numbness    Peripheral vascular insufficiency (HCC)    Retinopathy    Bilateral   TIA (transient ischemic attack)    left clouds in eyes went to [facility] happened after a knee surgery   Tremor    Vitamin D deficiency    Past Surgical History:  Procedure Laterality Date   ABDOMINAL HYSTERECTOMY     partial and then total   ANKLE SURGERY Right    BACK SURGERY     COLONOSCOPY     ELBOW SURGERY     left   KNEE ARTHROSCOPY Right 09/12/2018   Procedure: ARTHROSCOPY KNEE; SYNOVECTOMY;  Surgeon: Ollen Gross, MD;  Location: [facility];  Service: Orthopedics;  Laterality: Right;    PATELLA REVISION Right 09/10/2021   Procedure: Resection arthroplasty right patella;  Surgeon: Ollen Gross, MD;  Location: [facility];  Service: Orthopedics;  Laterality: Right;   PATELLECTOMY Right 08/03/2022   Procedure: RIGHT KNEE PATELLECTOMY;  Surgeon: Ollen Gross, MD;  Location: [facility];  Service: Orthopedics;   Laterality: Right;   REFRACTIVE SURGERY  2003   SPINAL CORD STIMULATOR INSERTION N/A 01/01/2021   Procedure: SPINAL CORD STIMULATOR PLACEMENT;  Surgeon: Venita Lick, MD;  Location: [facility];  Service: Orthopedics;  Laterality: N/A;   TOTAL KNEE ARTHROPLASTY     bilateral   TOTAL KNEE REVISION Right 03/09/2018   Procedure: RIGHT TOTAL KNEE REVISION;  Surgeon: Ollen Gross, MD;  Location: [facility];  Service: Orthopedics;  Laterality: Right;  with abductor block   VESICOVAGINAL FISTULA CLOSURE W/ TAH  1981   Patient Active Problem List   Diagnosis Date Noted   Painful total knee replacement, right (HCC) 08/03/2022   Chronic pain 01/01/2021   Paresthesia 06/14/2019   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) 06/14/2019   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   Aortic atherosclerosis (HCC) 12/08/2017   Hardening of the aorta (main artery of the heart) (HCC) 12/08/2017   History of total knee replacement, bilateral 11/04/2017   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HCC) 01/14/2015   Depression  02/28/2013   GERD (gastroesophageal reflux disease) 02/28/2013   Hiatal hernia with gastroesophageal reflux 02/28/2013   DDD (degenerative disc disease), lumbosacral 06/23/2012   Fibromyalgia syndrome 06/23/2012   Essential hypertension, benign 06/23/2012   Osteopenia 06/23/2012   Diabetes (HCC) 06/23/2012   Obesity due to excess calories 10/29/2010   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   Personal history of colonic polyps 10/11/2009     REFERRING PROVIDER: Ollen Gross MD  REFERRING DIAG: S/p right patellectomy  THERAPY DIAG:  Chronic pain of right knee  Stiffness of right knee, not elsewhere classified  Localized edema  Muscle weakness (generalized)  Rationale for Evaluation and Treatment: Rehabilitation  ONSET DATE: 08/03/22 (surgery date).  SUBJECTIVE:   SUBJECTIVE STATEMENT: The patient presents to the clinic s/p  right patellectomy performed on 08/03/22.  She does reports her pain is better when sitting but she continues to have pain with walking and performing ADL's.  In fact, she states she is limited to about 4-5 minutes of walking before she has to sit down.  She uses a scooter for shopping.   There are some ADL's she cannot perform due to pain.    PERTINENT HISTORY: Multiple prior right knee surgeries.  Lumbar stimulators. PAIN:  Are you having pain? Yes: NPRS scale: 6/10 Pain location: Right knee. Pain description: Ache. Aggravating factors: As above Relieving factors: As above.  PRECAUTIONS: Other: No ultrasound.   WEIGHT BEARING RESTRICTIONS: No  FALLS:  Has patient fallen in last 6 months? Yes. Number of falls 1.  LIVING ENVIRONMENT: Lives with: lives with their spouse Lives in: House/apartment Has following equipment at home: None  OCCUPATION: Retired.  PLOF: Needs assistance with ADLs  PATIENT GOALS: Be able to walk longer and perform all ADL's.    OBJECTIVE:    PATIENT SURVEYS:  FOTO .  EDEMA:  Circumferential: 1.5 cms greater on right than left.   PALPATION: No significant palpable tenderness of right knee.  LOWER EXTREMITY ROM:  -15 degrees of active right knee extension and active flexion to 130 degrees.  LOWER EXTREMITY MMT:    Notable right quadriceps atrophy and decreased volitional contraction per contralateral comparison with right hip flexion is 4-/5, abduction 4/5 and quad strength is a 4/5.  GAIT: Slow and cautious gait without assistive device.   TODAY'S TREATMENT:                                                                                                                              DATE: 11-17-22                                    EXERCISE LOG  Exercise Repetitions and Resistance Comments  Nustep  L5 x 11 mins   Mini-squats  3x10 in // bars with UE assist Sit and rest as needed  Heel ups/ Toe ups 2x10   LAQ  X  10 3# hold at top 5  secs   SAQ's With VMS    Blank cell = exercise not performed today  Manual: Extension isometrics x 10 hold 5 secs at 90 degrees flexion PROM for extension VMS (4 electrodes) to patient's right quadriceps x 12 minutes for neuro re-education with 10 sec extension holds f/b a 10 sec rest.  Performing SAQ's.   PATIENT EDUCATION:    HOME EXERCISE PROGRAM: ASSESSMENT:  CLINICAL IMPRESSION: Pt arrived today doing fair, but with c/o RT knee being weak and tired. Rx focused on CKC and OKC therex as well as manual resist isometrics for quad activation and strengthening. VMS was also applied to facilitate quads during SAQ ex. Pt did well , but fatigues quickly.      OBJECTIVE IMPAIRMENTS: Abnormal gait, decreased activity tolerance, difficulty walking, decreased ROM, increased edema, and pain.   ACTIVITY LIMITATIONS: carrying, lifting, bending, standing, and locomotion level  PARTICIPATION LIMITATIONS: meal prep, cleaning, laundry, and shopping  PERSONAL FACTORS: Time since onset of injury/illness/exacerbation and 1 comorbidity: multiple right knee surgeries  are also affecting patient's functional outcome.   REHAB POTENTIAL: Good  CLINICAL DECISION MAKING: Stable/uncomplicated  EVALUATION COMPLEXITY: Low   GOALS:  SHORT TERM GOALS: Target date: 12/10/22 Ind with a HEP. Goal status: INITIAL  2.  Solid right hip and knee strength to 4+/5 for greater ease in performing ADL's.  Goal status: INITIAL  3.  Walk a community distance with pain not > 4/10.  Goal status: INITIAL  4.  Perform all ADL's with pain not > 4/10.  Goal status: INITIAL    PLAN:  PT FREQUENCY: 2x/week  PT DURATION: 4 weeks  PLANNED INTERVENTIONS: Therapeutic exercises, Therapeutic activity, Neuromuscular re-education, Balance training, Gait training, Patient/Family education, Self Care, Cryotherapy, Moist heat, Vasopneumatic device, and Manual therapy  PLAN FOR NEXT SESSION: Right LE strengthening.      ,CHRIS, PTA 11/17/2022, 1:40 PM

## 2022-11-19 ENCOUNTER — Encounter: Payer: Self-pay | Admitting: *Deleted

## 2022-11-19 ENCOUNTER — Ambulatory Visit: Payer: Medicare Other | Admitting: *Deleted

## 2022-11-19 DIAGNOSIS — G8929 Other chronic pain: Secondary | ICD-10-CM

## 2022-11-19 DIAGNOSIS — M25561 Pain in right knee: Secondary | ICD-10-CM | POA: Diagnosis not present

## 2022-11-19 DIAGNOSIS — M25661 Stiffness of right knee, not elsewhere classified: Secondary | ICD-10-CM

## 2022-11-19 DIAGNOSIS — R600 Localized edema: Secondary | ICD-10-CM | POA: Diagnosis not present

## 2022-11-19 DIAGNOSIS — M6281 Muscle weakness (generalized): Secondary | ICD-10-CM | POA: Diagnosis not present

## 2022-11-19 NOTE — Therapy (Signed)
OUTPATIENT PHYSICAL THERAPY LOWER EXTREMITY EVALUATION   Patient Name: [name] MRN: [id number] DOB:[birth year], 77 y.o., female Today's Date: 11/19/2022  END OF SESSION:  PT End of Session - 11/19/22 0946     Visit Number 3    Number of Visits 8    Date for PT Re-Evaluation 12/09/22    PT Start Time 0930    PT Stop Time 1023    PT Time Calculation (min) 53 min             Past Medical History:  Diagnosis Date   Allergy    seasonal   Anxiety    Arthritis    [place]    Colon polyps    Complication of anesthesia    per pt, she woke up in the middle of last colon in  2014 and it makes her constipated                   2014.   Depression    Diabetes mellitus, type 2 (HCC) 06/2010   Diabetic neuropathy (HCC)    Diverticulosis    pt unaware   Esophageal stricture    Fibromyalgia    [place]    GERD (gastroesophageal reflux disease)    Hemorrhoids    Hiatal hernia    Hyperlipidemia    Hypertension    Menopause    Neuropathy due to medical condition (HCC)    Numbness    Peripheral vascular insufficiency (HCC)    Retinopathy    Bilateral   TIA (transient ischemic attack)    left clouds in eyes went to [facility] happened after a knee surgery   Tremor    Vitamin D deficiency    Past Surgical History:  Procedure Laterality Date   ABDOMINAL HYSTERECTOMY     partial and then total   ANKLE SURGERY Right    BACK SURGERY     COLONOSCOPY     ELBOW SURGERY     left   KNEE ARTHROSCOPY Right 09/12/2018   Procedure: ARTHROSCOPY KNEE; SYNOVECTOMY;  Surgeon: Ollen Gross, MD;  Location: [facility];  Service: Orthopedics;  Laterality: Right;    PATELLA REVISION Right 09/10/2021   Procedure: Resection arthroplasty right patella;  Surgeon: Ollen Gross, MD;  Location: [facility];  Service: Orthopedics;  Laterality: Right;   PATELLECTOMY Right 08/03/2022   Procedure: RIGHT KNEE PATELLECTOMY;  Surgeon: Ollen Gross, MD;  Location: [facility];  Service: Orthopedics;   Laterality: Right;   REFRACTIVE SURGERY  2003   SPINAL CORD STIMULATOR INSERTION N/A 01/01/2021   Procedure: SPINAL CORD STIMULATOR PLACEMENT;  Surgeon: Venita Lick, MD;  Location: [facility];  Service: Orthopedics;  Laterality: N/A;   TOTAL KNEE ARTHROPLASTY     bilateral   TOTAL KNEE REVISION Right 03/09/2018   Procedure: RIGHT TOTAL KNEE REVISION;  Surgeon: Ollen Gross, MD;  Location: [facility];  Service: Orthopedics;  Laterality: Right;  with abductor block   VESICOVAGINAL FISTULA CLOSURE W/ TAH  1981   Patient Active Problem List   Diagnosis Date Noted   Painful total knee replacement, right (HCC) 08/03/2022   Chronic pain 01/01/2021   Paresthesia 06/14/2019   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) 06/14/2019   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   Aortic atherosclerosis (HCC) 12/08/2017   Hardening of the aorta (main artery of the heart) (HCC) 12/08/2017   History of total knee replacement, bilateral 11/04/2017   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HCC) 01/14/2015   Depression  02/28/2013   GERD (gastroesophageal reflux disease) 02/28/2013   Hiatal hernia with gastroesophageal reflux 02/28/2013   DDD (degenerative disc disease), lumbosacral 06/23/2012   Fibromyalgia syndrome 06/23/2012   Essential hypertension, benign 06/23/2012   Osteopenia 06/23/2012   Diabetes (HCC) 06/23/2012   Obesity due to excess calories 10/29/2010   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   Personal history of colonic polyps 10/11/2009     REFERRING PROVIDER: Ollen Gross MD  REFERRING DIAG: S/p right patellectomy  THERAPY DIAG:  Chronic pain of right knee  Stiffness of right knee, not elsewhere classified  Localized edema  Rationale for Evaluation and Treatment: Rehabilitation  ONSET DATE: 08/03/22 (surgery date).  SUBJECTIVE:   SUBJECTIVE STATEMENT:    Did good after last Rx PERTINENT HISTORY: Multiple prior right knee  surgeries.  Lumbar stimulators. PAIN:  Are you having pain? Yes: NPRS scale: 6/10 Pain location: Right knee. Pain description: Ache. Aggravating factors: As above Relieving factors: As above.  PRECAUTIONS: Other: No ultrasound.   WEIGHT BEARING RESTRICTIONS: No  FALLS:  Has patient fallen in last 6 months? Yes. Number of falls 1.  LIVING ENVIRONMENT: Lives with: lives with their spouse Lives in: House/apartment Has following equipment at home: None  OCCUPATION: Retired.  PLOF: Needs assistance with ADLs  PATIENT GOALS: Be able to walk longer and perform all ADL's.    OBJECTIVE:    PATIENT SURVEYS:  FOTO .  EDEMA:  Circumferential: 1.5 cms greater on right than left.   PALPATION: No significant palpable tenderness of right knee.  LOWER EXTREMITY ROM:  -15 degrees of active right knee extension and active flexion to 130 degrees.  LOWER EXTREMITY MMT:    Notable right quadriceps atrophy and decreased volitional contraction per contralateral comparison with right hip flexion is 4-/5, abduction 4/5 and quad strength is a 4/5.  GAIT: Slow and cautious gait without assistive device.   TODAY'S TREATMENT:                                                                                                                              DATE:                                               11-19-22                                    EXERCISE LOG  Exercise Repetitions and Resistance Comments  Nustep  L6 x 12 mins   Mini-squats  3x10 in // bars with UE assist Sit and rest after 3x10  Heel ups/ Toe ups 3x10 Sit and rest after 3x10  LAQ  3 X 10 5# hold at top 5 secs   SAQ's With VMS        Blank  cell = exercise not performed today  Manual: Extension isometrics x 10 hold 5 secs at 90 degrees flexion  VMS (4 electrodes) to patient's right quadriceps x 12 minutes for neuro re-education with 10 sec extension holds f/b a 10 sec rest.  Performing SAQ's.   PATIENT EDUCATION:     HOME EXERCISE PROGRAM: ASSESSMENT:  CLINICAL IMPRESSION: Pt arrived today doing fairly well with RT knee and did well after last Rx. Rx focused again on RT LE strengthening using OKC and CKC exs. Pt did better with standing therex today and was able to perform more sets before needing to rest. VMS end of session for TKE control.   OBJECTIVE IMPAIRMENTS: Abnormal gait, decreased activity tolerance, difficulty walking, decreased ROM, increased edema, and pain.   ACTIVITY LIMITATIONS: carrying, lifting, bending, standing, and locomotion level  PARTICIPATION LIMITATIONS: meal prep, cleaning, laundry, and shopping  PERSONAL FACTORS: Time since onset of injury/illness/exacerbation and 1 comorbidity: multiple right knee surgeries  are also affecting patient's functional outcome.   REHAB POTENTIAL: Good  CLINICAL DECISION MAKING: Stable/uncomplicated  EVALUATION COMPLEXITY: Low   GOALS:  SHORT TERM GOALS: Target date: 12/10/22 Ind with a HEP. Goal status: MET  2.  Solid right hip and knee strength to 4+/5 for greater ease in performing ADL's.  Goal status: INITIAL  3.  Walk a community distance with pain not > 4/10.  Goal status: INITIAL  4.  Perform all ADL's with pain not > 4/10.  Goal status: INITIAL    PLAN:  PT FREQUENCY: 2x/week  PT DURATION: 4 weeks  PLANNED INTERVENTIONS: Therapeutic exercises, Therapeutic activity, Neuromuscular re-education, Balance training, Gait training, Patient/Family education, Self Care, Cryotherapy, Moist heat, Vasopneumatic device, and Manual therapy  PLAN FOR NEXT SESSION: Right LE strengthening.     ,CHRIS, PTA 11/19/2022, 10:33 AM

## 2022-11-20 ENCOUNTER — Ambulatory Visit (HOSPITAL_COMMUNITY): Admission: RE | Admit: 2022-11-20 | Payer: Medicare Other | Source: Ambulatory Visit

## 2022-11-20 DIAGNOSIS — Z122 Encounter for screening for malignant neoplasm of respiratory organs: Secondary | ICD-10-CM | POA: Diagnosis not present

## 2022-11-20 DIAGNOSIS — Z87891 Personal history of nicotine dependence: Secondary | ICD-10-CM

## 2022-11-24 ENCOUNTER — Encounter: Payer: Medicare Other | Admitting: *Deleted

## 2022-11-26 ENCOUNTER — Ambulatory Visit: Payer: Medicare Other | Admitting: *Deleted

## 2022-11-26 DIAGNOSIS — R600 Localized edema: Secondary | ICD-10-CM

## 2022-11-26 DIAGNOSIS — M25561 Pain in right knee: Secondary | ICD-10-CM | POA: Diagnosis not present

## 2022-11-26 DIAGNOSIS — G8929 Other chronic pain: Secondary | ICD-10-CM | POA: Diagnosis not present

## 2022-11-26 DIAGNOSIS — M25661 Stiffness of right knee, not elsewhere classified: Secondary | ICD-10-CM

## 2022-11-26 DIAGNOSIS — M6281 Muscle weakness (generalized): Secondary | ICD-10-CM | POA: Diagnosis not present

## 2022-11-26 NOTE — Therapy (Signed)
OUTPATIENT PHYSICAL THERAPY LOWER EXTREMITY TREATMENT   Patient Name: [name] MRN: [id number] DOB:July 21, 1945, 77 y.o., female Today's Date: 11/26/2022  END OF SESSION:  PT End of Session - 11/26/22 1034     Visit Number 4    Number of Visits 8    Date for PT Re-Evaluation 12/09/22    PT Start Time 1015    PT Stop Time 1108    PT Time Calculation (min) 53 min             Past Medical History:  Diagnosis Date   Allergy    seasonal   Anxiety    Arthritis    [place]    Colon polyps    Complication of anesthesia    per pt, she woke up in the middle of last colon in  2014 and it makes her constipated                   2014.   Depression    Diabetes mellitus, type 2 (HCC) 06/2010   Diabetic neuropathy (HCC)    Diverticulosis    pt unaware   Esophageal stricture    Fibromyalgia    [place]    GERD (gastroesophageal reflux disease)    Hemorrhoids    Hiatal hernia    Hyperlipidemia    Hypertension    Menopause    Neuropathy due to medical condition (HCC)    Numbness    Peripheral vascular insufficiency (HCC)    Retinopathy    Bilateral   TIA (transient ischemic attack)    left clouds in eyes went to [facility] happened after a knee surgery   Tremor    Vitamin D deficiency    Past Surgical History:  Procedure Laterality Date   ABDOMINAL HYSTERECTOMY     partial and then total   ANKLE SURGERY Right    BACK SURGERY     COLONOSCOPY     ELBOW SURGERY     left   KNEE ARTHROSCOPY Right 09/12/2018   Procedure: ARTHROSCOPY KNEE; SYNOVECTOMY;  Surgeon: Ollen Gross, MD;  Location: [facility];  Service: Orthopedics;  Laterality: Right;    PATELLA REVISION Right 09/10/2021   Procedure: Resection arthroplasty right patella;  Surgeon: Ollen Gross, MD;  Location: [facility];  Service: Orthopedics;  Laterality: Right;   PATELLECTOMY Right 08/03/2022   Procedure: RIGHT KNEE PATELLECTOMY;  Surgeon: Ollen Gross, MD;  Location: [facility];  Service: Orthopedics;   Laterality: Right;   REFRACTIVE SURGERY  2003   SPINAL CORD STIMULATOR INSERTION N/A 01/01/2021   Procedure: SPINAL CORD STIMULATOR PLACEMENT;  Surgeon: Venita Lick, MD;  Location: [facility];  Service: Orthopedics;  Laterality: N/A;   TOTAL KNEE ARTHROPLASTY     bilateral   TOTAL KNEE REVISION Right 03/09/2018   Procedure: RIGHT TOTAL KNEE REVISION;  Surgeon: Ollen Gross, MD;  Location: [facility];  Service: Orthopedics;  Laterality: Right;  with abductor block   VESICOVAGINAL FISTULA CLOSURE W/ TAH  1981   Patient Active Problem List   Diagnosis Date Noted   Painful total knee replacement, right (HCC) 08/03/2022   Chronic pain 01/01/2021   Paresthesia 06/14/2019   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) 06/14/2019   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   Aortic atherosclerosis (HCC) 12/08/2017   Hardening of the aorta (main artery of the heart) (HCC) 12/08/2017   History of total knee replacement, bilateral 11/04/2017   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HCC) 01/14/2015   Depression  02/28/2013   GERD (gastroesophageal reflux disease) 02/28/2013   Hiatal hernia with gastroesophageal reflux 02/28/2013   DDD (degenerative disc disease), lumbosacral 06/23/2012   Fibromyalgia syndrome 06/23/2012   Essential hypertension, benign 06/23/2012   Osteopenia 06/23/2012   Diabetes (HCC) 06/23/2012   Obesity due to excess calories 10/29/2010   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   Personal history of colonic polyps 10/11/2009     REFERRING PROVIDER: Ollen Gross MD  REFERRING DIAG: S/p right patellectomy  THERAPY DIAG:  Chronic pain of right knee  Stiffness of right knee, not elsewhere classified  Localized edema  Rationale for Evaluation and Treatment: Rehabilitation  ONSET DATE: 08/03/22 (surgery date).  SUBJECTIVE:   SUBJECTIVE STATEMENT:    Did good after last Rx.   PERTINENT HISTORY: Multiple prior right  knee surgeries.  Lumbar stimulators. PAIN:  Are you having pain? Yes: NPRS scale: 6/10 Pain location: Right knee. Pain description: Ache. Aggravating factors: As above Relieving factors: As above.  PRECAUTIONS: Other: No ultrasound.   WEIGHT BEARING RESTRICTIONS: No  FALLS:  Has patient fallen in last 6 months? Yes. Number of falls 1.  LIVING ENVIRONMENT: Lives with: lives with their spouse Lives in: House/apartment Has following equipment at home: None  OCCUPATION: Retired.  PLOF: Needs assistance with ADLs  PATIENT GOALS: Be able to walk longer and perform all ADL's.    OBJECTIVE:    PATIENT SURVEYS:  FOTO .  EDEMA:  Circumferential: 1.5 cms greater on right than left.   PALPATION: No significant palpable tenderness of right knee.  LOWER EXTREMITY ROM:  -15 degrees of active right knee extension and active flexion to 130 degrees.  LOWER EXTREMITY MMT:    Notable right quadriceps atrophy and decreased volitional contraction per contralateral comparison with right hip flexion is 4-/5, abduction 4/5 and quad strength is a 4/5.  GAIT: Slow and cautious gait without assistive device.   TODAY'S TREATMENT:                                                                                                                              DATE:                                               11-26-22                                    EXERCISE LOG  Exercise Repetitions and Resistance Comments  Nustep  L3 x 15 mins   Mini-squats  3x10 in // bars with UE assist Sit and rest after 3x10  Heel ups/ Toe ups 3x10 Sit and rest after 3x10  LAQ  3 X 10 5# hold at top 5 secs   SAQ's With VMS   Rocker board X 5  mins PF,DF balance    Blank cell = exercise not performed today  Manual: Extension isometrics x 10 hold 5 secs at 90 degrees flexion  VMS (4 electrodes) to patient's right quadriceps x 10 minutes for neuro re-education with 10 sec extension holds f/b a 10 sec rest.   Performing SAQ's.   PATIENT EDUCATION:    HOME EXERCISE PROGRAM: ASSESSMENT:  CLINICAL IMPRESSION: Pt arrived today doing fairly well with RT knee and did well after last Rx. Pt was able to continue with standing and seated LE strengthening for  RT side. Pt able to tolerate 15 mins on nustep today. Pt feels that she is progressing with strength and endurance.     OBJECTIVE IMPAIRMENTS: Abnormal gait, decreased activity tolerance, difficulty walking, decreased ROM, increased edema, and pain.   ACTIVITY LIMITATIONS: carrying, lifting, bending, standing, and locomotion level  PARTICIPATION LIMITATIONS: meal prep, cleaning, laundry, and shopping  PERSONAL FACTORS: Time since onset of injury/illness/exacerbation and 1 comorbidity: multiple right knee surgeries  are also affecting patient's functional outcome.   REHAB POTENTIAL: Good  CLINICAL DECISION MAKING: Stable/uncomplicated  EVALUATION COMPLEXITY: Low   GOALS:  SHORT TERM GOALS: Target date: 12/10/22 Ind with a HEP. Goal status: MET  2.  Solid right hip and knee strength to 4+/5 for greater ease in performing ADL's.  Goal status: INITIAL  3.  Walk a community distance with pain not > 4/10.  Goal status: INITIAL  4.  Perform all ADL's with pain not > 4/10.  Goal status: INITIAL    PLAN:  PT FREQUENCY: 2x/week  PT DURATION: 4 weeks  PLANNED INTERVENTIONS: Therapeutic exercises, Therapeutic activity, Neuromuscular re-education, Balance training, Gait training, Patient/Family education, Self Care, Cryotherapy, Moist heat, Vasopneumatic device, and Manual therapy  PLAN FOR NEXT SESSION: Right LE strengthening.     Lorely Bubb,CHRIS, PTA 11/26/2022, 11:18 AM

## 2022-11-30 ENCOUNTER — Encounter: Payer: Self-pay | Admitting: Family Medicine

## 2022-12-01 ENCOUNTER — Ambulatory Visit: Payer: Medicare Other | Admitting: Physical Therapy

## 2022-12-01 DIAGNOSIS — G8929 Other chronic pain: Secondary | ICD-10-CM

## 2022-12-01 DIAGNOSIS — M6281 Muscle weakness (generalized): Secondary | ICD-10-CM | POA: Diagnosis not present

## 2022-12-01 DIAGNOSIS — M25561 Pain in right knee: Secondary | ICD-10-CM | POA: Diagnosis not present

## 2022-12-01 DIAGNOSIS — R600 Localized edema: Secondary | ICD-10-CM | POA: Diagnosis not present

## 2022-12-01 DIAGNOSIS — M25661 Stiffness of right knee, not elsewhere classified: Secondary | ICD-10-CM

## 2022-12-01 NOTE — Therapy (Signed)
OUTPATIENT PHYSICAL THERAPY LOWER EXTREMITY TREATMENT   Patient Name: [name] MRN: [id number] DOB:1945-11-28, 77 y.o., female Today's Date: 12/01/2022  END OF SESSION:  PT End of Session - 12/01/22 1039     Visit Number 5    Number of Visits 8    Date for PT Re-Evaluation 12/09/22    PT Start Time 1009    PT Stop Time 1036    PT Time Calculation (min) 27 min    Activity Tolerance Patient tolerated treatment well    Behavior During Therapy [facility] for tasks assessed/performed             Past Medical History:  Diagnosis Date   Allergy    seasonal   Anxiety    Arthritis    [place]    Colon polyps    Complication of anesthesia    per pt, she woke up in the middle of last colon in  2014 and it makes her constipated                   2014.   Depression    Diabetes mellitus, type 2 (HCC) 06/2010   Diabetic neuropathy (HCC)    Diverticulosis    pt unaware   Esophageal stricture    Fibromyalgia    [place]    GERD (gastroesophageal reflux disease)    Hemorrhoids    Hiatal hernia    Hyperlipidemia    Hypertension    Menopause    Neuropathy due to medical condition (HCC)    Numbness    Peripheral vascular insufficiency (HCC)    Retinopathy    Bilateral   TIA (transient ischemic attack)    left clouds in eyes went to [facility] happened after a knee surgery   Tremor    Vitamin D deficiency    Past Surgical History:  Procedure Laterality Date   ABDOMINAL HYSTERECTOMY     partial and then total   ANKLE SURGERY Right    BACK SURGERY     COLONOSCOPY     ELBOW SURGERY     left   KNEE ARTHROSCOPY Right 09/12/2018   Procedure: ARTHROSCOPY KNEE; SYNOVECTOMY;  Surgeon: Ollen Gross, MD;  Location: [facility];  Service: Orthopedics;  Laterality: Right;    PATELLA REVISION Right 09/10/2021   Procedure: Resection arthroplasty right patella;  Surgeon: Ollen Gross, MD;  Location: [facility];  Service: Orthopedics;  Laterality: Right;   PATELLECTOMY Right 08/03/2022    Procedure: RIGHT KNEE PATELLECTOMY;  Surgeon: Ollen Gross, MD;  Location: [facility];  Service: Orthopedics;  Laterality: Right;   REFRACTIVE SURGERY  2003   SPINAL CORD STIMULATOR INSERTION N/A 01/01/2021   Procedure: SPINAL CORD STIMULATOR PLACEMENT;  Surgeon: Venita Lick, MD;  Location: [facility];  Service: Orthopedics;  Laterality: N/A;   TOTAL KNEE ARTHROPLASTY     bilateral   TOTAL KNEE REVISION Right 03/09/2018   Procedure: RIGHT TOTAL KNEE REVISION;  Surgeon: Ollen Gross, MD;  Location: [facility];  Service: Orthopedics;  Laterality: Right;  with abductor block   VESICOVAGINAL FISTULA CLOSURE W/ TAH  1981   Patient Active Problem List   Diagnosis Date Noted   Painful total knee replacement, right (HCC) 08/03/2022   Chronic pain 01/01/2021   Paresthesia 06/14/2019   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) 06/14/2019   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   Aortic atherosclerosis (HCC) 12/08/2017   Hardening of the aorta (main artery of the heart) (HCC) 12/08/2017  History of total knee replacement, bilateral 11/04/2017   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HCC) 01/14/2015   Depression 02/28/2013   GERD (gastroesophageal reflux disease) 02/28/2013   Hiatal hernia with gastroesophageal reflux 02/28/2013   DDD (degenerative disc disease), lumbosacral 06/23/2012   Fibromyalgia syndrome 06/23/2012   Essential hypertension, benign 06/23/2012   Osteopenia 06/23/2012   Diabetes (HCC) 06/23/2012   Obesity due to excess calories 10/29/2010   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   Personal history of colonic polyps 10/11/2009     REFERRING PROVIDER: Ollen Gross MD  REFERRING DIAG: S/p right patellectomy  THERAPY DIAG:  No diagnosis found.  Rationale for Evaluation and Treatment: Rehabilitation  ONSET DATE: 08/03/22 (surgery date).  SUBJECTIVE:   SUBJECTIVE STATEMENT:    Did good after last Rx.  Would like to  work on straightening today.   PERTINENT HISTORY: Multiple prior right knee surgeries.  Lumbar stimulators. PAIN:  Are you having pain? Yes: NPRS scale: 6/10 Pain location: Right knee. Pain description: Ache. Aggravating factors: As above Relieving factors: As above.  PRECAUTIONS: Other: No ultrasound.   WEIGHT BEARING RESTRICTIONS: No  FALLS:  Has patient fallen in last 6 months? Yes. Number of falls 1.  LIVING ENVIRONMENT: Lives with: lives with their spouse Lives in: House/apartment Has following equipment at home: None  OCCUPATION: Retired.  PLOF: Needs assistance with ADLs  PATIENT GOALS: Be able to walk longer and perform all ADL's.    OBJECTIVE:    PATIENT SURVEYS:  FOTO .  EDEMA:  Circumferential: 1.5 cms greater on right than left.   PALPATION: No significant palpable tenderness of right knee.  LOWER EXTREMITY ROM:  -15 degrees of active right knee extension and active flexion to 130 degrees.  LOWER EXTREMITY MMT:    Notable right quadriceps atrophy and decreased volitional contraction per contralateral comparison with right hip flexion is 4-/5, abduction 4/5 and quad strength is a 4/5.  GAIT: Slow and cautious gait without assistive device.   TODAY'S TREATMENT:                                                                                                                              DATE:                                               12-01-22                                    EXERCISE LOG  Exercise Repetitions and Resistance Comments  Nustep  L3 x 10 mins   Manual:  1-1 right knee sustained extension stretching via overpressure and in SLR fashion x 15 minutes while performing STW/M to hamstrings.   PATIENT EDUCATION:    HOME EXERCISE PROGRAM: ASSESSMENT:  CLINICAL IMPRESSION:  Focused on right knee extension.  She is also going to have her husband perform a gentle overpressure stretch at home.  She also has access to an "IdealKnee"  stretching device at home. She tolerated stretching without complaint.   OBJECTIVE IMPAIRMENTS: Abnormal gait, decreased activity tolerance, difficulty walking, decreased ROM, increased edema, and pain.   ACTIVITY LIMITATIONS: carrying, lifting, bending, standing, and locomotion level  PARTICIPATION LIMITATIONS: meal prep, cleaning, laundry, and shopping  PERSONAL FACTORS: Time since onset of injury/illness/exacerbation and 1 comorbidity: multiple right knee surgeries  are also affecting patient's functional outcome.   REHAB POTENTIAL: Good  CLINICAL DECISION MAKING: Stable/uncomplicated  EVALUATION COMPLEXITY: Low   GOALS:  SHORT TERM GOALS: Target date: 12/10/22 Ind with a HEP. Goal status: MET  2.  Solid right hip and knee strength to 4+/5 for greater ease in performing ADL's.  Goal status: INITIAL  3.  Walk a community distance with pain not > 4/10.  Goal status: INITIAL  4.  Perform all ADL's with pain not > 4/10.  Goal status: INITIAL    PLAN:  PT FREQUENCY: 2x/week  PT DURATION: 4 weeks  PLANNED INTERVENTIONS: Therapeutic exercises, Therapeutic activity, Neuromuscular re-education, Balance training, Gait training, Patient/Family education, Self Care, Cryotherapy, Moist heat, Vasopneumatic device, and Manual therapy  PLAN FOR NEXT SESSION: Right LE strengthening.     Shanera Meske, [place], PT 12/01/2022, 10:40 AM

## 2022-12-03 ENCOUNTER — Ambulatory Visit: Payer: Medicare Other | Admitting: *Deleted

## 2022-12-03 DIAGNOSIS — R600 Localized edema: Secondary | ICD-10-CM

## 2022-12-03 DIAGNOSIS — M25661 Stiffness of right knee, not elsewhere classified: Secondary | ICD-10-CM | POA: Diagnosis not present

## 2022-12-03 DIAGNOSIS — M6281 Muscle weakness (generalized): Secondary | ICD-10-CM

## 2022-12-03 DIAGNOSIS — G8929 Other chronic pain: Secondary | ICD-10-CM

## 2022-12-03 DIAGNOSIS — M25561 Pain in right knee: Secondary | ICD-10-CM | POA: Diagnosis not present

## 2022-12-03 NOTE — Therapy (Signed)
OUTPATIENT PHYSICAL THERAPY LOWER EXTREMITY TREATMENT   Patient Name: [name] MRN: [id number] DOB:22-Jan-1946, 77 y.o., female Today's Date: 12/03/2022  END OF SESSION:  PT End of Session - 12/03/22 1051     Visit Number 6    Number of Visits 8    Date for PT Re-Evaluation 12/09/22    PT Start Time 1015    PT Stop Time 1102    PT Time Calculation (min) 47 min              Past Medical History:  Diagnosis Date   Allergy    seasonal   Anxiety    Arthritis    [place]    Colon polyps    Complication of anesthesia    per pt, she woke up in the middle of last colon in  2014 and it makes her constipated                   2014.   Depression    Diabetes mellitus, type 2 (HCC) 06/2010   Diabetic neuropathy (HCC)    Diverticulosis    pt unaware   Esophageal stricture    Fibromyalgia    [place]    GERD (gastroesophageal reflux disease)    Hemorrhoids    Hiatal hernia    Hyperlipidemia    Hypertension    Menopause    Neuropathy due to medical condition (HCC)    Numbness    Peripheral vascular insufficiency (HCC)    Retinopathy    Bilateral   TIA (transient ischemic attack)    left clouds in eyes went to [facility] happened after a knee surgery   Tremor    Vitamin D deficiency    Past Surgical History:  Procedure Laterality Date   ABDOMINAL HYSTERECTOMY     partial and then total   ANKLE SURGERY Right    BACK SURGERY     COLONOSCOPY     ELBOW SURGERY     left   KNEE ARTHROSCOPY Right 09/12/2018   Procedure: ARTHROSCOPY KNEE; SYNOVECTOMY;  Surgeon: Ollen Gross, MD;  [facility];  Service: Orthopedics;  Laterality: Right;    PATELLA REVISION Right 09/10/2021   Procedure: Resection arthroplasty right patella;  Surgeon: Ollen Gross, MD;  [facility]: [facility];  Service: Orthopedics;  Laterality: Right;   PATELLECTOMY Right 08/03/2022   Procedure: RIGHT KNEE PATELLECTOMY;  Surgeon: Ollen Gross, MD;  [facility]: [facility];  Service: Orthopedics;   Laterality: Right;   REFRACTIVE SURGERY  2003   SPINAL CORD STIMULATOR INSERTION N/A 01/01/2021   Procedure: SPINAL CORD STIMULATOR PLACEMENT;  Surgeon: Venita Lick, MD;  Location: [facility];  Service: Orthopedics;  Laterality: N/A;   TOTAL KNEE ARTHROPLASTY     bilateral   TOTAL KNEE REVISION Right 03/09/2018   Procedure: RIGHT TOTAL KNEE REVISION;  Surgeon: Ollen Gross, MD;  Location: [facility];  Service: Orthopedics;  Laterality: Right;  with abductor block   VESICOVAGINAL FISTULA CLOSURE W/ TAH  1981   Patient Active Problem List   Diagnosis Date Noted   Painful total knee replacement, right (HCC) 08/03/2022   Chronic pain 01/01/2021   Paresthesia 06/14/2019   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) 06/14/2019   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   Aortic atherosclerosis (HCC) 12/08/2017   Hardening of the aorta (main artery of the heart) (HCC) 12/08/2017   History of total knee replacement, bilateral 11/04/2017   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HCC) 01/14/2015  Depression 02/28/2013   GERD (gastroesophageal reflux disease) 02/28/2013   Hiatal hernia with gastroesophageal reflux 02/28/2013   DDD (degenerative disc disease), lumbosacral 06/23/2012   Fibromyalgia syndrome 06/23/2012   Essential hypertension, benign 06/23/2012   Osteopenia 06/23/2012   Diabetes (HCC) 06/23/2012   Obesity due to excess calories 10/29/2010   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   Personal history of colonic polyps 10/11/2009     REFERRING PROVIDER: Ollen Gross MD  REFERRING DIAG: S/p right patellectomy  THERAPY DIAG:  Chronic pain of right knee  Stiffness of right knee, not elsewhere classified  Muscle weakness (generalized)  Localized edema  Rationale for Evaluation and Treatment: Rehabilitation  ONSET DATE: 08/03/22 (surgery date).  SUBJECTIVE:   SUBJECTIVE STATEMENT:    Did good after last Rx.  Would like to  work on straightening again   PERTINENT HISTORY: Multiple prior right knee surgeries.  Lumbar stimulators. PAIN:  Are you having pain? Yes: NPRS scale: 6/10 Pain location: Right knee. Pain description: Ache. Aggravating factors: As above Relieving factors: As above.  PRECAUTIONS: Other: No ultrasound.   WEIGHT BEARING RESTRICTIONS: No  FALLS:  Has patient fallen in last 6 months? Yes. Number of falls 1.  LIVING ENVIRONMENT: Lives with: lives with their spouse Lives in: House/apartment Has following equipment at home: None  OCCUPATION: Retired.  PLOF: Needs assistance with ADLs  PATIENT GOALS: Be able to walk longer and perform all ADL's.    OBJECTIVE:    PATIENT SURVEYS:  FOTO .  EDEMA:  Circumferential: 1.5 cms greater on right than left.   PALPATION: No significant palpable tenderness of right knee.  LOWER EXTREMITY ROM:  -15 degrees of active right knee extension and active flexion to 130 degrees.  LOWER EXTREMITY MMT:    Notable right quadriceps atrophy and decreased volitional contraction per contralateral comparison with right hip flexion is 4-/5, abduction 4/5 and quad strength is a 4/5.  GAIT: Slow and cautious gait without assistive device.   TODAY'S TREATMENT:                                                                                                                              DATE:                                               12-01-22                                    EXERCISE LOG  Exercise Repetitions and Resistance Comments  Nustep  L3 x 15 mins   Manual:  1-1 right knee sustained extension stretching via overpressure with QS 3 x 5 min bouts SAQs after each stretching bout.3x10 4# wt. X 3 rounds   PATIENT EDUCATION:    HOME  EXERCISE PROGRAM: ASSESSMENT:  CLINICAL IMPRESSION: Focused on right knee extension ROM  again as well as TKE strengthening.  LTG's are on going at this time due to strength deficits  as well as pain  6/10.      OBJECTIVE IMPAIRMENTS: Abnormal gait, decreased activity tolerance, difficulty walking, decreased ROM, increased edema, and pain.   ACTIVITY LIMITATIONS: carrying, lifting, bending, standing, and locomotion level  PARTICIPATION LIMITATIONS: meal prep, cleaning, laundry, and shopping  PERSONAL FACTORS: Time since onset of injury/illness/exacerbation and 1 comorbidity: multiple right knee surgeries  are also affecting patient's functional outcome.   REHAB POTENTIAL: Good  CLINICAL DECISION MAKING: Stable/uncomplicated  EVALUATION COMPLEXITY: Low   GOALS:  SHORT TERM GOALS: Target date: 12/10/22 Ind with a HEP. Goal status: MET  2.  Solid right hip and knee strength to 4+/5 for greater ease in performing ADL's.  Goal status: On going 4/5  3.  Walk a community distance with pain not > 4/10.  Goal status: On going  6/10  4.  Perform all ADL's with pain not > 4/10.  Goal status: On going   6/10    PLAN:  PT FREQUENCY: 2x/week  PT DURATION: 4 weeks  PLANNED INTERVENTIONS: Therapeutic exercises, Therapeutic activity, Neuromuscular re-education, Balance training, Gait training, Patient/Family education, Self Care, Cryotherapy, Moist heat, Vasopneumatic device, and Manual therapy  PLAN FOR NEXT SESSION: Right LE strengthening.     Graceson Nichelson,CHRIS, PTA 12/03/2022, 11:04 AM

## 2022-12-08 ENCOUNTER — Ambulatory Visit: Payer: Medicare Other | Attending: Orthopedic Surgery | Admitting: *Deleted

## 2022-12-08 DIAGNOSIS — M25661 Stiffness of right knee, not elsewhere classified: Secondary | ICD-10-CM | POA: Diagnosis not present

## 2022-12-08 DIAGNOSIS — M25561 Pain in right knee: Secondary | ICD-10-CM | POA: Insufficient documentation

## 2022-12-08 DIAGNOSIS — G8929 Other chronic pain: Secondary | ICD-10-CM | POA: Insufficient documentation

## 2022-12-08 DIAGNOSIS — M6281 Muscle weakness (generalized): Secondary | ICD-10-CM | POA: Diagnosis not present

## 2022-12-08 DIAGNOSIS — R600 Localized edema: Secondary | ICD-10-CM | POA: Diagnosis not present

## 2022-12-08 NOTE — Therapy (Signed)
OUTPATIENT PHYSICAL THERAPY LOWER EXTREMITY TREATMENT   Patient Name: [name] MRN: [id number] DOB:01/10/1946, 77 y.o., female Today's Date: 12/08/2022  END OF SESSION:  PT End of Session - 12/08/22 1306     Visit Number 7    Number of Visits 8    Date for PT Re-Evaluation 12/09/22    PT Start Time 1306    PT Stop Time 1348    PT Time Calculation (min) 42 min              Past Medical History:  Diagnosis Date   Allergy    seasonal   Anxiety    Arthritis    [place]    Colon polyps    Complication of anesthesia    per pt, she woke up in the middle of last colon in  2014 and it makes her constipated                   2014.   Depression    Diabetes mellitus, type 2 (HCC) 06/2010   Diabetic neuropathy (HCC)    Diverticulosis    pt unaware   Esophageal stricture    Fibromyalgia    [place]    GERD (gastroesophageal reflux disease)    Hemorrhoids    Hiatal hernia    Hyperlipidemia    Hypertension    Menopause    Neuropathy due to medical condition (HCC)    Numbness    Peripheral vascular insufficiency (HCC)    Retinopathy    Bilateral   TIA (transient ischemic attack)    left clouds in eyes went to [facility] happened after a knee surgery   Tremor    Vitamin D deficiency    Past Surgical History:  Procedure Laterality Date   ABDOMINAL HYSTERECTOMY     partial and then total   ANKLE SURGERY Right    BACK SURGERY     COLONOSCOPY     ELBOW SURGERY     left   KNEE ARTHROSCOPY Right 09/12/2018   Procedure: ARTHROSCOPY KNEE; SYNOVECTOMY;  Surgeon: Ollen Gross, MD;  Location: [facility];  Service: Orthopedics;  Laterality: Right;    PATELLA REVISION Right 09/10/2021   Procedure: Resection arthroplasty right patella;  Surgeon: Ollen Gross, MD;  Location: [facility];  Service: Orthopedics;  Laterality: Right;   PATELLECTOMY Right 08/03/2022   Procedure: RIGHT KNEE PATELLECTOMY;  Surgeon: Ollen Gross, MD;  [facility];  Service: Orthopedics;   Laterality: Right;   REFRACTIVE SURGERY  2003   SPINAL CORD STIMULATOR INSERTION N/A 01/01/2021   Procedure: SPINAL CORD STIMULATOR PLACEMENT;  Surgeon: Venita Lick, MD;  Location: [facility];  Service: Orthopedics;  Laterality: N/A;   TOTAL KNEE ARTHROPLASTY     bilateral   TOTAL KNEE REVISION Right 03/09/2018   Procedure: RIGHT TOTAL KNEE REVISION;  Surgeon: Ollen Gross, MD;  [facility];  Service: Orthopedics;  Laterality: Right;  with abductor block   VESICOVAGINAL FISTULA CLOSURE W/ TAH  1981   Patient Active Problem List   Diagnosis Date Noted   Painful total knee replacement, right (HCC) 08/03/2022   Chronic pain 01/01/2021   Paresthesia 06/14/2019   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) 06/14/2019   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   Aortic atherosclerosis (HCC) 12/08/2017   Hardening of the aorta (main artery of the heart) (HCC) 12/08/2017   History of total knee replacement, bilateral 11/04/2017   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HCC) 01/14/2015  Depression 02/28/2013   GERD (gastroesophageal reflux disease) 02/28/2013   Hiatal hernia with gastroesophageal reflux 02/28/2013   DDD (degenerative disc disease), lumbosacral 06/23/2012   Fibromyalgia syndrome 06/23/2012   Essential hypertension, benign 06/23/2012   Osteopenia 06/23/2012   Diabetes (HCC) 06/23/2012   Obesity due to excess calories 10/29/2010   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   Personal history of colonic polyps 10/11/2009     REFERRING PROVIDER: Ollen Gross MD  REFERRING DIAG: S/p right patellectomy  THERAPY DIAG:  Chronic pain of right knee  Stiffness of right knee, not elsewhere classified  Muscle weakness (generalized)  Rationale for Evaluation and Treatment: Rehabilitation  ONSET DATE: 08/03/22 (surgery date).  SUBJECTIVE:   SUBJECTIVE STATEMENT:    Did good after last Rx.  Would like to work on  straightening again   PERTINENT HISTORY: Multiple prior right knee surgeries.  Lumbar stimulators. PAIN:  Are you having pain? Yes: NPRS scale: 6/10 Pain location: Right knee. Pain description: Ache. Aggravating factors: As above Relieving factors: As above.  PRECAUTIONS: Other: No ultrasound.   WEIGHT BEARING RESTRICTIONS: No  FALLS:  Has patient fallen in last 6 months? Yes. Number of falls 1.  LIVING ENVIRONMENT: Lives with: lives with their spouse Lives in: House/apartment Has following equipment at home: None  OCCUPATION: Retired.  PLOF: Needs assistance with ADLs  PATIENT GOALS: Be able to walk longer and perform all ADL's.    OBJECTIVE:    PATIENT SURVEYS:  FOTO .  EDEMA:  Circumferential: 1.5 cms greater on right than left.   PALPATION: No significant palpable tenderness of right knee.  LOWER EXTREMITY ROM:  -15 degrees of active right knee extension and active flexion to 130 degrees.  LOWER EXTREMITY MMT:    Notable right quadriceps atrophy and decreased volitional contraction per contralateral comparison with right hip flexion is 4-/5, abduction 4/5 and quad strength is a 4/5.  GAIT: Slow and cautious gait without assistive device.   TODAY'S TREATMENT:                                                                                                                              DATE:                                               12-08-22                                    EXERCISE LOG                           RT knee  Exercise Repetitions and Resistance Comments  Nustep  L3 x 15 mins   Rocker board Calf stretcing x 3 mins   6in  step up 3x10 UE assist   Manual:  1-1 right knee sustained extension stretching via overpressure with QS 3 x 5 min bouts SAQs after each stretching bout.3x10 4# wt. X 3 rounds   PATIENT EDUCATION:    HOME EXERCISE PROGRAM: ASSESSMENT:  CLINICAL IMPRESSION: Rx Focused on Both right knee extension ROM  progression and again as well as TKE strengthening with [place] and [facility] exs.  LTG's are on going at this time due to strength deficits  as well as pain 6/10. Recert? Pt to discuss with MD     OBJECTIVE IMPAIRMENTS: Abnormal gait, decreased activity tolerance, difficulty walking, decreased ROM, increased edema, and pain.   ACTIVITY LIMITATIONS: carrying, lifting, bending, standing, and locomotion level  PARTICIPATION LIMITATIONS: meal prep, cleaning, laundry, and shopping  PERSONAL FACTORS: Time since onset of injury/illness/exacerbation and 1 comorbidity: multiple right knee surgeries  are also affecting patient's functional outcome.   REHAB POTENTIAL: Good  CLINICAL DECISION MAKING: Stable/uncomplicated  EVALUATION COMPLEXITY: Low   GOALS:  SHORT TERM GOALS: Target date: 12/10/22 Ind with a HEP. Goal status: MET  2.  Solid right hip and knee strength to 4+/5 for greater ease in performing ADL's.  Goal status: On going 4/5  3.  Walk a community distance with pain not > 4/10.  Goal status: On going  6/10  4.  Perform all ADL's with pain not > 4/10.  Goal status: On going   6/10    PLAN:  PT FREQUENCY: 2x/week  PT DURATION: 4 weeks  PLANNED INTERVENTIONS: Therapeutic exercises, Therapeutic activity, Neuromuscular re-education, Balance training, Gait training, Patient/Family education, Self Care, Cryotherapy, Moist heat, Vasopneumatic device, and Manual therapy  PLAN FOR NEXT SESSION: Right LE strengthening.     Kiyomi Pallo,CHRIS, PTA 12/08/2022, 1:51 PM

## 2022-12-10 ENCOUNTER — Ambulatory Visit: Payer: Medicare Other | Admitting: *Deleted

## 2022-12-10 DIAGNOSIS — R600 Localized edema: Secondary | ICD-10-CM | POA: Diagnosis not present

## 2022-12-10 DIAGNOSIS — M25661 Stiffness of right knee, not elsewhere classified: Secondary | ICD-10-CM | POA: Diagnosis not present

## 2022-12-10 DIAGNOSIS — M6281 Muscle weakness (generalized): Secondary | ICD-10-CM | POA: Diagnosis not present

## 2022-12-10 DIAGNOSIS — G8929 Other chronic pain: Secondary | ICD-10-CM

## 2022-12-10 DIAGNOSIS — M25561 Pain in right knee: Secondary | ICD-10-CM | POA: Diagnosis not present

## 2022-12-10 NOTE — Addendum Note (Signed)
Addended by: Jai Steil, [place] W on: 12/10/2022 11:45 AM   Modules accepted: Orders

## 2022-12-10 NOTE — Therapy (Addendum)
OUTPATIENT PHYSICAL THERAPY LOWER EXTREMITY TREATMENT   Patient Name: [name] MRN: [id number] DOB:1945/08/11, 77 y.o., female Today's Date: 12/10/2022  END OF SESSION:  PT End of Session - 12/10/22 1030     Visit Number 8    Number of Visits 14    Date for PT Re-Evaluation 12/30/22    PT Start Time 1015    PT Stop Time 1101    PT Time Calculation (min) 46 min              Past Medical History:  Diagnosis Date   Allergy    seasonal   Anxiety    Arthritis    [place]    Colon polyps    Complication of anesthesia    per pt, she woke up in the middle of last colon in  2014 and it makes her constipated                   2014.   Depression    Diabetes mellitus, type 2 (HCC) 06/2010   Diabetic neuropathy (HCC)    Diverticulosis    pt unaware   Esophageal stricture    Fibromyalgia    [place]    GERD (gastroesophageal reflux disease)    Hemorrhoids    Hiatal hernia    Hyperlipidemia    Hypertension    Menopause    Neuropathy due to medical condition (HCC)    Numbness    Peripheral vascular insufficiency (HCC)    Retinopathy    Bilateral   TIA (transient ischemic attack)    left clouds in eyes went to [facility] happened after a knee surgery   Tremor    Vitamin D deficiency    Past Surgical History:  Procedure Laterality Date   ABDOMINAL HYSTERECTOMY     partial and then total   ANKLE SURGERY Right    BACK SURGERY     COLONOSCOPY     ELBOW SURGERY     left   KNEE ARTHROSCOPY Right 09/12/2018   Procedure: ARTHROSCOPY KNEE; SYNOVECTOMY;  Surgeon: Ollen Gross, MD;  Location: [facility];  Service: Orthopedics;  Laterality: Right;    PATELLA REVISION Right 09/10/2021   Procedure: Resection arthroplasty right patella;  Surgeon: Ollen Gross, MD;  Location: [facility];  Service: Orthopedics;  Laterality: Right;   PATELLECTOMY Right 08/03/2022   Procedure: RIGHT KNEE PATELLECTOMY;  Surgeon: Ollen Gross, MD;  Location: [facility];  Service: Orthopedics;   Laterality: Right;   REFRACTIVE SURGERY  2003   SPINAL CORD STIMULATOR INSERTION N/A 01/01/2021   Procedure: SPINAL CORD STIMULATOR PLACEMENT;  Surgeon: Venita Lick, MD;  Location: [facility];  Service: Orthopedics;  Laterality: N/A;   TOTAL KNEE ARTHROPLASTY     bilateral   TOTAL KNEE REVISION Right 03/09/2018   Procedure: RIGHT TOTAL KNEE REVISION;  Surgeon: Ollen Gross, MD;  Location: [facility];  Service: Orthopedics;  Laterality: Right;  with abductor block   VESICOVAGINAL FISTULA CLOSURE W/ TAH  1981   Patient Active Problem List   Diagnosis Date Noted   Painful total knee replacement, right (HCC) 08/03/2022   Chronic pain 01/01/2021   Paresthesia 06/14/2019   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) 06/14/2019   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   Aortic atherosclerosis (HCC) 12/08/2017   Hardening of the aorta (main artery of the heart) (HCC) 12/08/2017   History of total knee replacement, bilateral 11/04/2017   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HCC) 01/14/2015  Depression 02/28/2013   GERD (gastroesophageal reflux disease) 02/28/2013   Hiatal hernia with gastroesophageal reflux 02/28/2013   DDD (degenerative disc disease), lumbosacral 06/23/2012   Fibromyalgia syndrome 06/23/2012   Essential hypertension, benign 06/23/2012   Osteopenia 06/23/2012   Diabetes (HCC) 06/23/2012   Obesity due to excess calories 10/29/2010   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   Personal history of colonic polyps 10/11/2009     REFERRING PROVIDER: Ollen Gross MD  REFERRING DIAG: S/p right patellectomy  THERAPY DIAG:  Chronic pain of right knee - Plan: PT plan of care cert/re-cert  Stiffness of right knee, not elsewhere classified - Plan: PT plan of care cert/re-cert  Muscle weakness (generalized) - Plan: PT plan of care cert/re-cert  Rationale for Evaluation and Treatment: Rehabilitation  ONSET DATE: 08/03/22 (surgery  date).  SUBJECTIVE:   SUBJECTIVE STATEMENT:    Did good after last Rx.  Would like to work on straightening and strengthening. MD said okay to continue PT   PERTINENT HISTORY: Multiple prior right knee surgeries.  Lumbar stimulators. PAIN:  Are you having pain? Yes: NPRS scale: 6/10 Pain location: Right knee. Pain description: Ache. Aggravating factors: As above Relieving factors: As above.  PRECAUTIONS: Other: No ultrasound.   WEIGHT BEARING RESTRICTIONS: No  FALLS:  Has patient fallen in last 6 months? Yes. Number of falls 1.  LIVING ENVIRONMENT: Lives with: lives with their spouse Lives in: House/apartment Has following equipment at home: None  OCCUPATION: Retired.  PLOF: Needs assistance with ADLs  PATIENT GOALS: Be able to walk longer and perform all ADL's.    OBJECTIVE:    PATIENT SURVEYS:  FOTO .  EDEMA:  Circumferential: 1.5 cms greater on right than left.   PALPATION: No significant palpable tenderness of right knee.  LOWER EXTREMITY ROM:  -15 degrees of active right knee extension and active flexion to 130 degrees.  LOWER EXTREMITY MMT:    Notable right quadriceps atrophy and decreased volitional contraction per contralateral comparison with right hip flexion is 4-/5, abduction 4/5 and quad strength is a 4/5.  GAIT: Slow and cautious gait without assistive device.   TODAY'S TREATMENT:                                                                                                                              DATE:                                               12-10-22                                    EXERCISE LOG  RT knee  Exercise Repetitions and Resistance Comments  Nustep  L3 x 15 mins   Rocker board Calf stretcing x 3 mins   6in step up 3x10 UE assist   Manual:  1-1 right knee sustained extension stretching via overpressure with QS 4 x bouts SAQs after each stretching bout.3x10 4# wt. X 3  rounds   PATIENT EDUCATION:    HOME EXERCISE PROGRAM: ASSESSMENT:  CLINICAL IMPRESSION: Pt arrived reporting MD said okay to continue PT. Rx Focused on right knee extension ROM progression and RT knee strengthening TKE strengthening with [place] and CKC exs with step ups.  LTG's are on going at this time due to strength deficits and - pain 6/10. Recert     OBJECTIVE IMPAIRMENTS: Abnormal gait, decreased activity tolerance, difficulty walking, decreased ROM, increased edema, and pain.   ACTIVITY LIMITATIONS: carrying, lifting, bending, standing, and locomotion level  PARTICIPATION LIMITATIONS: meal prep, cleaning, laundry, and shopping  PERSONAL FACTORS: Time since onset of injury/illness/exacerbation and 1 comorbidity: multiple right knee surgeries  are also affecting patient's functional outcome.   REHAB POTENTIAL: Good  CLINICAL DECISION MAKING: Stable/uncomplicated  EVALUATION COMPLEXITY: Low   GOALS:  SHORT TERM GOALS: Target date: 12/10/22 Ind with a HEP. Goal status: MET  2.  Solid right hip and knee strength to 4+/5 for greater ease in performing ADL's.  Goal status: On going 4/5  3.  Walk a community distance with pain not > 4/10.  Goal status: On going  6/10  4.  Perform all ADL's with pain not > 4/10.  Goal status: On going   6/10    PLAN:  PT FREQUENCY: 2x/week  PT DURATION: 4 weeks  PLANNED INTERVENTIONS: Therapeutic exercises, Therapeutic activity, Neuromuscular re-education, Balance training, Gait training, Patient/Family education, Self Care, Cryotherapy, Moist heat, Vasopneumatic device, and Manual therapy  PLAN FOR NEXT SESSION: Right LE strengthening and Extension. Recert   [place], [place], PT 12/10/2022, 11:45 AM

## 2022-12-15 ENCOUNTER — Ambulatory Visit: Payer: Medicare Other | Admitting: Physical Therapy

## 2022-12-15 DIAGNOSIS — M25661 Stiffness of right knee, not elsewhere classified: Secondary | ICD-10-CM | POA: Diagnosis not present

## 2022-12-15 DIAGNOSIS — G8929 Other chronic pain: Secondary | ICD-10-CM

## 2022-12-15 DIAGNOSIS — M6281 Muscle weakness (generalized): Secondary | ICD-10-CM | POA: Diagnosis not present

## 2022-12-15 DIAGNOSIS — M25561 Pain in right knee: Secondary | ICD-10-CM | POA: Diagnosis not present

## 2022-12-15 DIAGNOSIS — R600 Localized edema: Secondary | ICD-10-CM | POA: Diagnosis not present

## 2022-12-15 NOTE — Therapy (Addendum)
OUTPATIENT PHYSICAL THERAPY LOWER EXTREMITY TREATMENT   Patient Name: [name] MRN: [id number] DOB:03/16/1946, 77 y.o., female Today's Date: 12/15/2022  END OF SESSION:  PT End of Session - 12/15/22 1022     Visit Number 9    Number of Visits 14    Date for PT Re-Evaluation 12/30/22    PT Start Time 1017    Activity Tolerance Patient tolerated treatment well    Behavior During Therapy [facility] for tasks assessed/performed              Past Medical History:  Diagnosis Date   Allergy    seasonal   Anxiety    Arthritis    [place]    Colon polyps    Complication of anesthesia    per pt, she woke up in the middle of last colon in  2014 and it makes her constipated                   2014.   Depression    Diabetes mellitus, type 2 (HCC) 06/2010   Diabetic neuropathy (HCC)    Diverticulosis    pt unaware   Esophageal stricture    Fibromyalgia    [place]    GERD (gastroesophageal reflux disease)    Hemorrhoids    Hiatal hernia    Hyperlipidemia    Hypertension    Menopause    Neuropathy due to medical condition (HCC)    Numbness    Peripheral vascular insufficiency (HCC)    Retinopathy    Bilateral   TIA (transient ischemic attack)    left clouds in eyes went to [facility] happened after a knee surgery   Tremor    Vitamin D deficiency    Past Surgical History:  Procedure Laterality Date   ABDOMINAL HYSTERECTOMY     partial and then total   ANKLE SURGERY Right    BACK SURGERY     COLONOSCOPY     ELBOW SURGERY     left   KNEE ARTHROSCOPY Right 09/12/2018   Procedure: ARTHROSCOPY KNEE; SYNOVECTOMY;  Surgeon: Ollen Gross, MD;  Location: [facility];  Service: Orthopedics;  Laterality: Right;    PATELLA REVISION Right 09/10/2021   Procedure: Resection arthroplasty right patella;  Surgeon: Ollen Gross, MD;  Location: [facility];  Service: Orthopedics;  Laterality: Right;   PATELLECTOMY Right 08/03/2022   Procedure: RIGHT KNEE PATELLECTOMY;  Surgeon: Ollen Gross, MD;  Location: [facility];  Service: Orthopedics;  Laterality: Right;   REFRACTIVE SURGERY  2003   SPINAL CORD STIMULATOR INSERTION N/A 01/01/2021   Procedure: SPINAL CORD STIMULATOR PLACEMENT;  Surgeon: Venita Lick, MD;  Location: [facility];  Service: Orthopedics;  Laterality: N/A;   TOTAL KNEE ARTHROPLASTY     bilateral   TOTAL KNEE REVISION Right 03/09/2018   Procedure: RIGHT TOTAL KNEE REVISION;  Surgeon: Ollen Gross, MD;  Location: [facility];  Service: Orthopedics;  Laterality: Right;  with abductor block   VESICOVAGINAL FISTULA CLOSURE W/ TAH  1981   Patient Active Problem List   Diagnosis Date Noted   Painful total knee replacement, right (HCC) 08/03/2022   Chronic pain 01/01/2021   Paresthesia 06/14/2019   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) 06/14/2019   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   Aortic atherosclerosis (HCC) 12/08/2017   Hardening of the aorta (main artery of the heart) (HCC) 12/08/2017   History of total knee replacement, bilateral 11/04/2017   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HCC)  01/14/2015   Depression 02/28/2013   GERD (gastroesophageal reflux disease) 02/28/2013   Hiatal hernia with gastroesophageal reflux 02/28/2013   DDD (degenerative disc disease), lumbosacral 06/23/2012   Fibromyalgia syndrome 06/23/2012   Essential hypertension, benign 06/23/2012   Osteopenia 06/23/2012   Diabetes (HCC) 06/23/2012   Obesity due to excess calories 10/29/2010   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   Personal history of colonic polyps 10/11/2009     REFERRING PROVIDER: Ollen Gross MD  REFERRING DIAG: S/p right patellectomy  THERAPY DIAG:  Chronic pain of right knee  Stiffness of right knee, not elsewhere classified  Rationale for Evaluation and Treatment: Rehabilitation  ONSET DATE: 08/03/22 (surgery date).  SUBJECTIVE:   SUBJECTIVE STATEMENT:    Did good after last Rx.  Would like to  work on straightening and strengthening. MD said okay to continue PT   PERTINENT HISTORY: Multiple prior right knee surgeries.  Lumbar stimulators. PAIN:  Are you having pain? Yes: NPRS scale: 6/10 Pain location: Right knee. Pain description: Ache. Aggravating factors: As above Relieving factors: As above.  PRECAUTIONS: Other: No ultrasound.   WEIGHT BEARING RESTRICTIONS: No  FALLS:  Has patient fallen in last 6 months? Yes. Number of falls 1.  LIVING ENVIRONMENT: Lives with: lives with their spouse Lives in: House/apartment Has following equipment at home: None  OCCUPATION: Retired.  PLOF: Needs assistance with ADLs  PATIENT GOALS: Be able to walk longer and perform all ADL's.    OBJECTIVE:    PATIENT SURVEYS:  FOTO .  EDEMA:  Circumferential: 1.5 cms greater on right than left.   PALPATION: No significant palpable tenderness of right knee.  LOWER EXTREMITY ROM:  -15 degrees of active right knee extension and active flexion to 130 degrees.  LOWER EXTREMITY MMT:    Notable right quadriceps atrophy and decreased volitional contraction per contralateral comparison with right hip flexion is 4-/5, abduction 4/5 and quad strength is a 4/5.  GAIT: Slow and cautious gait without assistive device.   TODAY'S TREATMENT:                                                                                                                              DATE:                                                12/15/22:                                    EXERCISE LOG  Exercise Repetitions and Resistance Comments  Nustep  Level 5 x 17 minutes.   Knee ext 10# x 2 minutes   Ham curls 30# x 3 minutes           In supine:  Sustained 8 minute  knee ext stretch (overpressure and in SLR fashion).      12-10-22                                    EXERCISE LOG                           RT knee  Exercise Repetitions and Resistance Comments  Nustep  L3 x 15 mins   Rocker board  Calf stretcing x 3 mins   6in step up 3x10 UE assist   Manual:  1-1 right knee sustained extension stretching via overpressure with QS 4 x bouts SAQs after each stretching bout.3x10 4# wt. X 3 rounds   PATIENT EDUCATION:    HOME EXERCISE PROGRAM: ASSESSMENT:  CLINICAL IMPRESSION: Patient's pain remaining around a 6.  She did well with weight machines today and felt a nice "burn" in her quadriceps.  She did well strecthing and states her husband is doing this for her at home as well.   OBJECTIVE IMPAIRMENTS: Abnormal gait, decreased activity tolerance, difficulty walking, decreased ROM, increased edema, and pain.   ACTIVITY LIMITATIONS: carrying, lifting, bending, standing, and locomotion level  PARTICIPATION LIMITATIONS: meal prep, cleaning, laundry, and shopping  PERSONAL FACTORS: Time since onset of injury/illness/exacerbation and 1 comorbidity: multiple right knee surgeries  are also affecting patient's functional outcome.   REHAB POTENTIAL: Good  CLINICAL DECISION MAKING: Stable/uncomplicated  EVALUATION COMPLEXITY: Low   GOALS:  SHORT TERM GOALS: Target date: 12/10/22 Ind with a HEP. Goal status: MET  2.  Solid right hip and knee strength to 4+/5 for greater ease in performing ADL's.  Goal status: On going 4/5  3.  Walk a community distance with pain not > 4/10.  Goal status: On going  6/10  4.  Perform all ADL's with pain not > 4/10.  Goal status: On going   6/10    PLAN:  PT FREQUENCY: 2x/week  PT DURATION: 4 weeks  PLANNED INTERVENTIONS: Therapeutic exercises, Therapeutic activity, Neuromuscular re-education, Balance training, Gait training, Patient/Family education, Self Care, Cryotherapy, Moist heat, Vasopneumatic device, and Manual therapy  PLAN FOR NEXT SESSION: Right LE strengthening and Extension. Recert   Martavius Lusty, [place], PT 12/15/2022, 10:36 AM

## 2022-12-17 ENCOUNTER — Encounter: Payer: Medicare Other | Admitting: *Deleted

## 2022-12-18 ENCOUNTER — Ambulatory Visit: Payer: Medicare Other | Admitting: Physical Therapy

## 2022-12-18 DIAGNOSIS — G8929 Other chronic pain: Secondary | ICD-10-CM | POA: Diagnosis not present

## 2022-12-18 DIAGNOSIS — M6281 Muscle weakness (generalized): Secondary | ICD-10-CM | POA: Diagnosis not present

## 2022-12-18 DIAGNOSIS — M25661 Stiffness of right knee, not elsewhere classified: Secondary | ICD-10-CM

## 2022-12-18 DIAGNOSIS — M25561 Pain in right knee: Secondary | ICD-10-CM | POA: Diagnosis not present

## 2022-12-18 DIAGNOSIS — R600 Localized edema: Secondary | ICD-10-CM | POA: Diagnosis not present

## 2022-12-18 NOTE — Therapy (Addendum)
OUTPATIENT PHYSICAL THERAPY LOWER EXTREMITY TREATMENT   Patient Name: [name] MRN: [id number] DOB:Mar 09, 1946, 77 y.o., female Today's Date: 12/18/2022  END OF SESSION:  PT End of Session - 12/18/22 0835     Visit Number 10    Number of Visits 14    Date for PT Re-Evaluation 12/30/22    PT Start Time 0759    PT Stop Time 0837    PT Time Calculation (min) 38 min    Activity Tolerance Patient tolerated treatment well    Behavior During Therapy [facility] for tasks assessed/performed              Past Medical History:  Diagnosis Date   Allergy    seasonal   Anxiety    Arthritis    [place]    Colon polyps    Complication of anesthesia    per pt, she woke up in the middle of last colon in  2014 and it makes her constipated                   2014.   Depression    Diabetes mellitus, type 2 (HCC) 06/2010   Diabetic neuropathy (HCC)    Diverticulosis    pt unaware   Esophageal stricture    Fibromyalgia    [place]    GERD (gastroesophageal reflux disease)    Hemorrhoids    Hiatal hernia    Hyperlipidemia    Hypertension    Menopause    Neuropathy due to medical condition (HCC)    Numbness    Peripheral vascular insufficiency (HCC)    Retinopathy    Bilateral   TIA (transient ischemic attack)    left clouds in eyes went to [facility] happened after a knee surgery   Tremor    Vitamin D deficiency    Past Surgical History:  Procedure Laterality Date   ABDOMINAL HYSTERECTOMY     partial and then total   ANKLE SURGERY Right    BACK SURGERY     COLONOSCOPY     ELBOW SURGERY     left   KNEE ARTHROSCOPY Right 09/12/2018   Procedure: ARTHROSCOPY KNEE; SYNOVECTOMY;  Surgeon: Ollen Gross, MD;  Location: [facility];  Service: Orthopedics;  Laterality: Right;    PATELLA REVISION Right 09/10/2021   Procedure: Resection arthroplasty right patella;  Surgeon: Ollen Gross, MD;  Location: [facility];  Service: Orthopedics;  Laterality: Right;   PATELLECTOMY Right  08/03/2022   Procedure: RIGHT KNEE PATELLECTOMY;  Surgeon: Ollen Gross, MD;  Location: [facility];  Service: Orthopedics;  Laterality: Right;   REFRACTIVE SURGERY  2003   SPINAL CORD STIMULATOR INSERTION N/A 01/01/2021   Procedure: SPINAL CORD STIMULATOR PLACEMENT;  Surgeon: Venita Lick, MD;  Location: [facility];  Service: Orthopedics;  Laterality: N/A;   TOTAL KNEE ARTHROPLASTY     bilateral   TOTAL KNEE REVISION Right 03/09/2018   Procedure: RIGHT TOTAL KNEE REVISION;  Surgeon: Ollen Gross, MD;  Location: [facility];  Service: Orthopedics;  Laterality: Right;  with abductor block   VESICOVAGINAL FISTULA CLOSURE W/ TAH  1981   Patient Active Problem List   Diagnosis Date Noted   Painful total knee replacement, right (HCC) 08/03/2022   Chronic pain 01/01/2021   Paresthesia 06/14/2019   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) 06/14/2019   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   Aortic atherosclerosis (HCC) 12/08/2017   Hardening of the aorta (main artery of the heart) (HCC)  12/08/2017   History of total knee replacement, bilateral 11/04/2017   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HCC) 01/14/2015   Depression 02/28/2013   GERD (gastroesophageal reflux disease) 02/28/2013   Hiatal hernia with gastroesophageal reflux 02/28/2013   DDD (degenerative disc disease), lumbosacral 06/23/2012   Fibromyalgia syndrome 06/23/2012   Essential hypertension, benign 06/23/2012   Osteopenia 06/23/2012   Diabetes (HCC) 06/23/2012   Obesity due to excess calories 10/29/2010   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   Personal history of colonic polyps 10/11/2009     REFERRING PROVIDER: Ollen Gross MD  REFERRING DIAG: S/p right patellectomy  THERAPY DIAG:  Chronic pain of right knee  Stiffness of right knee, not elsewhere classified  Rationale for Evaluation and Treatment: Rehabilitation  ONSET DATE: 08/03/22 (surgery date).  SUBJECTIVE:    SUBJECTIVE STATEMENT:    No new complaints.  PERTINENT HISTORY: Multiple prior right knee surgeries.  Lumbar stimulators. PAIN:  Are you having pain? Yes: NPRS scale: 6/10 Pain location: Right knee. Pain description: Ache. Aggravating factors: As above Relieving factors: As above.  PRECAUTIONS: Other: No ultrasound.   WEIGHT BEARING RESTRICTIONS: No  FALLS:  Has patient fallen in last 6 months? Yes. Number of falls 1.  LIVING ENVIRONMENT: Lives with: lives with their spouse Lives in: House/apartment Has following equipment at home: None  OCCUPATION: Retired.  PLOF: Needs assistance with ADLs  PATIENT GOALS: Be able to walk longer and perform all ADL's.    OBJECTIVE:    PATIENT SURVEYS:  FOTO .  EDEMA:  Circumferential: 1.5 cms greater on right than left.   PALPATION: No significant palpable tenderness of right knee.  LOWER EXTREMITY ROM:  -15 degrees of active right knee extension and active flexion to 130 degrees.  LOWER EXTREMITY MMT:    Notable right quadriceps atrophy and decreased volitional contraction per contralateral comparison with right hip flexion is 4-/5, abduction 4/5 and quad strength is a 4/5.  GAIT: Slow and cautious gait without assistive device.   TODAY'S TREATMENT:                                                                                                                              DATE:   12/18/22:                                     EXERCISE LOG  Exercise Repetitions and Resistance Comments  Nustep Level 5 x 15 minutes.   Knee ext 10# x 3 minutes   Ham curls 30# x 3 minutes           In supine:  Sustained 9 minute knee ext stretch (overpressure and in SLR fashion).  12/15/22:                                    EXERCISE LOG  Exercise Repetitions and Resistance Comments  Nustep  Level 5 x 17 minutes.   Knee ext 2 minutes   Ham curls 3 minutes           In  supine:  Sustained 8 minute knee ext stretch (overpressure and in SLR fashion).      12-10-22                                    EXERCISE LOG                           RT knee  Exercise Repetitions and Resistance Comments  Nustep  L3 x 15 mins   Rocker board Calf stretcing x 3 mins   6in step up 3x10 UE assist   Manual:  1-1 right knee sustained extension stretching via overpressure with QS 4 x bouts SAQs after each stretching bout.3x10 4# wt. X 3 rounds   PATIENT EDUCATION:    HOME EXERCISE PROGRAM: ASSESSMENT:  CLINICAL IMPRESSION: Added a minute to knee extension exercise. Patient did well and rested as needed.    OBJECTIVE IMPAIRMENTS: Abnormal gait, decreased activity tolerance, difficulty walking, decreased ROM, increased edema, and pain.   ACTIVITY LIMITATIONS: carrying, lifting, bending, standing, and locomotion level  PARTICIPATION LIMITATIONS: meal prep, cleaning, laundry, and shopping  PERSONAL FACTORS: Time since onset of injury/illness/exacerbation and 1 comorbidity: multiple right knee surgeries  are also affecting patient's functional outcome.   REHAB POTENTIAL: Good  CLINICAL DECISION MAKING: Stable/uncomplicated  EVALUATION COMPLEXITY: Low   GOALS:  SHORT TERM GOALS: Target date: 12/10/22 Ind with a HEP. Goal status: MET  2.  Solid right hip and knee strength to 4+/5 for greater ease in performing ADL's.  Goal status: On going 4/5  3.  Walk a community distance with pain not > 4/10.  Goal status: On going  6/10  4.  Perform all ADL's with pain not > 4/10.  Goal status: On going   6/10    PLAN:  PT FREQUENCY: 2x/week  PT DURATION: 4 weeks  PLANNED INTERVENTIONS: Therapeutic exercises, Therapeutic activity, Neuromuscular re-education, Balance training, Gait training, Patient/Family education, Self Care, Cryotherapy, Moist heat, Vasopneumatic device, and Manual therapy  PLAN FOR NEXT SESSION: Right LE strengthening and Extension.  Recert  Progress Note Reporting Period 11/12/22 to 12/18/22.  See note below for Objective Data and Assessment of Progress/Goals. Goals ongoing at this time.    Zubair Lofton, [place], PT 12/18/2022, 8:44 AM

## 2022-12-22 ENCOUNTER — Ambulatory Visit: Payer: Medicare Other | Admitting: Physical Therapy

## 2022-12-22 DIAGNOSIS — M6281 Muscle weakness (generalized): Secondary | ICD-10-CM | POA: Diagnosis not present

## 2022-12-22 DIAGNOSIS — M25661 Stiffness of right knee, not elsewhere classified: Secondary | ICD-10-CM | POA: Diagnosis not present

## 2022-12-22 DIAGNOSIS — R600 Localized edema: Secondary | ICD-10-CM | POA: Diagnosis not present

## 2022-12-22 DIAGNOSIS — M25561 Pain in right knee: Secondary | ICD-10-CM | POA: Diagnosis not present

## 2022-12-22 DIAGNOSIS — G8929 Other chronic pain: Secondary | ICD-10-CM | POA: Diagnosis not present

## 2022-12-22 NOTE — Therapy (Signed)
OUTPATIENT PHYSICAL THERAPY LOWER EXTREMITY TREATMENT   Patient Name: [name] MRN: [id number] DOB:January 25, 1946, 77 y.o., female Today's Date: 12/22/2022  END OF SESSION:  PT End of Session - 12/22/22 1028     Visit Number 11    Number of Visits 14    Date for PT Re-Evaluation 12/30/22    PT Start Time 1010    PT Stop Time 1045    PT Time Calculation (min) 35 min    Activity Tolerance Patient tolerated treatment well    Behavior During Therapy [facility] for tasks assessed/performed              Past Medical History:  Diagnosis Date   Allergy    seasonal   Anxiety    Arthritis    [place]    Colon polyps    Complication of anesthesia    per pt, she woke up in the middle of last colon in  2014 and it makes her constipated                   2014.   Depression    Diabetes mellitus, type 2 (HCC) 06/2010   Diabetic neuropathy (HCC)    Diverticulosis    pt unaware   Esophageal stricture    Fibromyalgia    [place]    GERD (gastroesophageal reflux disease)    Hemorrhoids    Hiatal hernia    Hyperlipidemia    Hypertension    Menopause    Neuropathy due to medical condition (HCC)    Numbness    Peripheral vascular insufficiency (HCC)    Retinopathy    Bilateral   TIA (transient ischemic attack)    left clouds in eyes went to [facility] happened after a knee surgery   Tremor    Vitamin D deficiency    Past Surgical History:  Procedure Laterality Date   ABDOMINAL HYSTERECTOMY     partial and then total   ANKLE SURGERY Right    BACK SURGERY     COLONOSCOPY     ELBOW SURGERY     left   KNEE ARTHROSCOPY Right 09/12/2018   Procedure: ARTHROSCOPY KNEE; SYNOVECTOMY;  Surgeon: Ollen Gross, MD;  Location: [facility];  Service: Orthopedics;  Laterality: Right;    PATELLA REVISION Right 09/10/2021   Procedure: Resection arthroplasty right patella;  Surgeon: Ollen Gross, MD;  Location: [facility];  Service: Orthopedics;  Laterality: Right;   PATELLECTOMY Right  08/03/2022   Procedure: RIGHT KNEE PATELLECTOMY;  Surgeon: Ollen Gross, MD;  [facility]: [facility];  Service: Orthopedics;  Laterality: Right;   REFRACTIVE SURGERY  2003   SPINAL CORD STIMULATOR INSERTION N/A 01/01/2021   Procedure: SPINAL CORD STIMULATOR PLACEMENT;  Surgeon: Venita Lick, MD;  Location: [facility];  Service: Orthopedics;  Laterality: N/A;   TOTAL KNEE ARTHROPLASTY     bilateral   TOTAL KNEE REVISION Right 03/09/2018   Procedure: RIGHT TOTAL KNEE REVISION;  Surgeon: Ollen Gross, MD;  [facility]: [facility];  Service: Orthopedics;  Laterality: Right;  with abductor block   VESICOVAGINAL FISTULA CLOSURE W/ TAH  1981   Patient Active Problem List   Diagnosis Date Noted   Painful total knee replacement, right (HCC) 08/03/2022   Chronic pain 01/01/2021   Paresthesia 06/14/2019   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) 06/14/2019   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   Aortic atherosclerosis (HCC) 12/08/2017   Hardening of the aorta (main artery of the heart) (HCC)  12/08/2017   History of total knee replacement, bilateral 11/04/2017   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HCC) 01/14/2015   Depression 02/28/2013   GERD (gastroesophageal reflux disease) 02/28/2013   Hiatal hernia with gastroesophageal reflux 02/28/2013   DDD (degenerative disc disease), lumbosacral 06/23/2012   Fibromyalgia syndrome 06/23/2012   Essential hypertension, benign 06/23/2012   Osteopenia 06/23/2012   Diabetes (HCC) 06/23/2012   Obesity due to excess calories 10/29/2010   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   Personal history of colonic polyps 10/11/2009     REFERRING PROVIDER: Ollen Gross MD  REFERRING DIAG: S/p right patellectomy  THERAPY DIAG:  Chronic pain of right knee  Stiffness of right knee, not elsewhere classified  Rationale for Evaluation and Treatment: Rehabilitation  ONSET DATE: 08/03/22 (surgery date).  SUBJECTIVE:    SUBJECTIVE STATEMENT:    No new complaints.  Pian at a 5.  PERTINENT HISTORY: Multiple prior right knee surgeries.  Lumbar stimulators. PAIN:  Are you having pain? Yes: NPRS scale: 5/10 Pain location: Right knee. Pain description: Ache. Aggravating factors: As above Relieving factors: As above.  PRECAUTIONS: Other: No ultrasound.   WEIGHT BEARING RESTRICTIONS: No  FALLS:  Has patient fallen in last 6 months? Yes. Number of falls 1.  LIVING ENVIRONMENT: Lives with: lives with their spouse Lives in: House/apartment Has following equipment at home: None  OCCUPATION: Retired.  PLOF: Needs assistance with ADLs  PATIENT GOALS: Be able to walk longer and perform all ADL's.    OBJECTIVE:    PATIENT SURVEYS:  FOTO .  EDEMA:  Circumferential: 1.5 cms greater on right than left.   PALPATION: No significant palpable tenderness of right knee.  LOWER EXTREMITY ROM:  -15 degrees of active right knee extension and active flexion to 130 degrees.  LOWER EXTREMITY MMT:    Notable right quadriceps atrophy and decreased volitional contraction per contralateral comparison with right hip flexion is 4-/5, abduction 4/5 and quad strength is a 4/5.  GAIT: Slow and cautious gait without assistive device.   TODAY'S TREATMENT:                                                                                                                              DATE:   12/22/22:                                     EXERCISE LOG  Exercise Repetitions and Resistance Comments  Nustep Level 5 x 15 minutes.   Knee ext 10# x 3 minutes   Ham curls 30# x 3 minutes           In supine:  Sustained 7 minute knee ext stretch (overpressure and in SLR fashion).  12/15/22:                                    EXERCISE LOG  Exercise Repetitions and Resistance Comments  Nustep  Level 5 x 17 minutes.   Knee ext 2 minutes   Ham curls 3 minutes            In supine:  Sustained 8 minute knee ext stretch (overpressure and in SLR fashion).      12-10-22                                    EXERCISE LOG                           RT knee  Exercise Repetitions and Resistance Comments  Nustep  L3 x 15 mins   Rocker board Calf stretcing x 3 mins   6in step up 3x10 UE assist   Manual:  1-1 right knee sustained extension stretching via overpressure with QS 4 x bouts SAQs after each stretching bout.3x10 4# wt. X 3 rounds   PATIENT EDUCATION:    HOME EXERCISE PROGRAM: ASSESSMENT:  CLINICAL IMPRESSION: Patient with a small decrease in pain.  Right knee extension measured in supine remains at -15 degrees.  OBJECTIVE IMPAIRMENTS: Abnormal gait, decreased activity tolerance, difficulty walking, decreased ROM, increased edema, and pain.   ACTIVITY LIMITATIONS: carrying, lifting, bending, standing, and locomotion level  PARTICIPATION LIMITATIONS: meal prep, cleaning, laundry, and shopping  PERSONAL FACTORS: Time since onset of injury/illness/exacerbation and 1 comorbidity: multiple right knee surgeries  are also affecting patient's functional outcome.   REHAB POTENTIAL: Good  CLINICAL DECISION MAKING: Stable/uncomplicated  EVALUATION COMPLEXITY: Low   GOALS:  SHORT TERM GOALS: Target date: 12/10/22 Ind with a HEP. Goal status: MET  2.  Solid right hip and knee strength to 4+/5 for greater ease in performing ADL's.  Goal status: On going 4/5  3.  Walk a community distance with pain not > 4/10.  Goal status: On going  6/10  4.  Perform all ADL's with pain not > 4/10.  Goal status: On going   6/10    PLAN:  PT FREQUENCY: 2x/week  PT DURATION: 4 weeks  PLANNED INTERVENTIONS: Therapeutic exercises, Therapeutic activity, Neuromuscular re-education, Balance training, Gait training, Patient/Family education, Self Care, Cryotherapy, Moist heat, Vasopneumatic device, and Manual therapy  PLAN FOR NEXT SESSION: Right LE  strengthening and Extension. Recert     Jana Swartzlander, [place], PT 12/22/2022, 10:50 AM

## 2022-12-24 ENCOUNTER — Encounter: Payer: Self-pay | Admitting: *Deleted

## 2022-12-24 ENCOUNTER — Ambulatory Visit: Payer: Medicare Other | Admitting: *Deleted

## 2022-12-24 DIAGNOSIS — M6281 Muscle weakness (generalized): Secondary | ICD-10-CM | POA: Diagnosis not present

## 2022-12-24 DIAGNOSIS — M25661 Stiffness of right knee, not elsewhere classified: Secondary | ICD-10-CM | POA: Diagnosis not present

## 2022-12-24 DIAGNOSIS — R600 Localized edema: Secondary | ICD-10-CM

## 2022-12-24 DIAGNOSIS — G8929 Other chronic pain: Secondary | ICD-10-CM

## 2022-12-24 DIAGNOSIS — M25561 Pain in right knee: Secondary | ICD-10-CM | POA: Diagnosis not present

## 2022-12-24 NOTE — Therapy (Signed)
OUTPATIENT PHYSICAL THERAPY LOWER EXTREMITY TREATMENT   Patient Name: [name] MRN: [id number] DOB:July 30, 1945, 77 y.o., female Today's Date: 12/24/2022  END OF SESSION:  PT End of Session - 12/24/22 1036     Visit Number 12    Number of Visits 14    Date for PT Re-Evaluation 12/30/22    PT Start Time 1015    PT Stop Time 1105    PT Time Calculation (min) 50 min              Past Medical History:  Diagnosis Date   Allergy    seasonal   Anxiety    Arthritis    [place]    Colon polyps    Complication of anesthesia    per pt, she woke up in the middle of last colon in  2014 and it makes her constipated                   2014.   Depression    Diabetes mellitus, type 2 (HCC) 06/2010   Diabetic neuropathy (HCC)    Diverticulosis    pt unaware   Esophageal stricture    Fibromyalgia    [place]    GERD (gastroesophageal reflux disease)    Hemorrhoids    Hiatal hernia    Hyperlipidemia    Hypertension    Menopause    Neuropathy due to medical condition (HCC)    Numbness    Peripheral vascular insufficiency (HCC)    Retinopathy    Bilateral   TIA (transient ischemic attack)    left clouds in eyes went to [facility] happened after a knee surgery   Tremor    Vitamin D deficiency    Past Surgical History:  Procedure Laterality Date   ABDOMINAL HYSTERECTOMY     partial and then total   ANKLE SURGERY Right    BACK SURGERY     COLONOSCOPY     ELBOW SURGERY     left   KNEE ARTHROSCOPY Right 09/12/2018   Procedure: ARTHROSCOPY KNEE; SYNOVECTOMY;  Surgeon: Ollen Gross, MD;  [facility];  Service: Orthopedics;  Laterality: Right;    PATELLA REVISION Right 09/10/2021   Procedure: Resection arthroplasty right patella;  Surgeon: Ollen Gross, MD;  [facility];  Service: Orthopedics;  Laterality: Right;   PATELLECTOMY Right 08/03/2022   Procedure: RIGHT KNEE PATELLECTOMY;  Surgeon: Ollen Gross, MD;  [facility];  Service: Orthopedics;   Laterality: Right;   REFRACTIVE SURGERY  2003   SPINAL CORD STIMULATOR INSERTION N/A 01/01/2021   Procedure: SPINAL CORD STIMULATOR PLACEMENT;  Surgeon: Venita Lick, MD;  [facility];  Service: Orthopedics;  Laterality: N/A;   TOTAL KNEE ARTHROPLASTY     bilateral   TOTAL KNEE REVISION Right 03/09/2018   Procedure: RIGHT TOTAL KNEE REVISION;  Surgeon: Ollen Gross, MD;  [facility];  Service: Orthopedics;  Laterality: Right;  with abductor block   VESICOVAGINAL FISTULA CLOSURE W/ TAH  1981   Patient Active Problem List   Diagnosis Date Noted   Painful total knee replacement, right (HCC) 08/03/2022   Chronic pain 01/01/2021   Paresthesia 06/14/2019   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) 06/14/2019   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   Aortic atherosclerosis (HCC) 12/08/2017   Hardening of the aorta (main artery of the heart) (HCC) 12/08/2017   History of total knee replacement, bilateral 11/04/2017   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HCC) 01/14/2015  Depression 02/28/2013   GERD (gastroesophageal reflux disease) 02/28/2013   Hiatal hernia with gastroesophageal reflux 02/28/2013   DDD (degenerative disc disease), lumbosacral 06/23/2012   Fibromyalgia syndrome 06/23/2012   Essential hypertension, benign 06/23/2012   Osteopenia 06/23/2012   Diabetes (HCC) 06/23/2012   Obesity due to excess calories 10/29/2010   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   Personal history of colonic polyps 10/11/2009     REFERRING PROVIDER: Ollen Gross MD  REFERRING DIAG: S/p right patellectomy  THERAPY DIAG:  Stiffness of right knee, not elsewhere classified  Muscle weakness (generalized)  Chronic pain of right knee  Localized edema  Rationale for Evaluation and Treatment: Rehabilitation  ONSET DATE: 08/03/22 (surgery date).  SUBJECTIVE:   SUBJECTIVE STATEMENT:    No new complaints.  Pian at a 5/10 RT  knee   PERTINENT HISTORY: Multiple prior right knee surgeries.  Lumbar stimulators. PAIN:  Are you having pain? Yes: NPRS scale: 5/10 Pain location: Right knee. Pain description: Ache. Aggravating factors: As above Relieving factors: As above.  PRECAUTIONS: Other: No ultrasound.   WEIGHT BEARING RESTRICTIONS: No  FALLS:  Has patient fallen in last 6 months? Yes. Number of falls 1.  LIVING ENVIRONMENT: Lives with: lives with their spouse Lives in: House/apartment Has following equipment at home: None  OCCUPATION: Retired.  PLOF: Needs assistance with ADLs  PATIENT GOALS: Be able to walk longer and perform all ADL's.    OBJECTIVE:    PATIENT SURVEYS:  FOTO .  EDEMA:  Circumferential: 1.5 cms greater on right than left.   PALPATION: No significant palpable tenderness of right knee.  LOWER EXTREMITY ROM:  -15 degrees of active right knee extension and active flexion to 130 degrees.  LOWER EXTREMITY MMT:    Notable right quadriceps atrophy and decreased volitional contraction per contralateral comparison with right hip flexion is 4-/5, abduction 4/5 and quad strength is a 4/5.  GAIT: Slow and cautious gait without assistive device.   TODAY'S TREATMENT:                                                                                                                              DATE:   12/24/22:                                     EXERCISE LOG  RT knee  Exercise Repetitions and Resistance Comments  Nustep Level 5 x 15 minutes.   Knee ext 10# 4x 15  stretch after each set   Ham curls 30#  4x15   stretch after each set           In supine:  STW to posterior aspect RT knee , calves and HS's in supine f/b  knee ext stretching withheel prooped and contract relax technique used  12/15/22:                                    EXERCISE LOG  Exercise Repetitions and Resistance Comments  Nustep  Level 5 x 17  minutes.   Knee ext 2 minutes   Ham curls 3 minutes           In supine:  Sustained 8 minute knee ext stretch (overpressure and in SLR fashion).      12-10-22                                    EXERCISE LOG                           RT knee  Exercise Repetitions and Resistance Comments  Nustep  L3 x 15 mins   Rocker board Calf stretcing x 3 mins   6in step up 3x10 UE assist   Manual:  1-1 right knee sustained extension stretching via overpressure with QS 4 x bouts SAQs after each stretching bout.3x10 4# wt. X 3 rounds   PATIENT EDUCATION:    HOME EXERCISE PROGRAM: ASSESSMENT:  CLINICAL IMPRESSION: Patient arrived today doing fairly well with Rx focusing on LE strengthening as well as  Right knee extension. Supine extension -12 degrees today. Discussed gym program with Pt for after DC from PT  OBJECTIVE IMPAIRMENTS: Abnormal gait, decreased activity tolerance, difficulty walking, decreased ROM, increased edema, and pain.   ACTIVITY LIMITATIONS: carrying, lifting, bending, standing, and locomotion level  PARTICIPATION LIMITATIONS: meal prep, cleaning, laundry, and shopping  PERSONAL FACTORS: Time since onset of injury/illness/exacerbation and 1 comorbidity: multiple right knee surgeries  are also affecting patient's functional outcome.   REHAB POTENTIAL: Good  CLINICAL DECISION MAKING: Stable/uncomplicated  EVALUATION COMPLEXITY: Low   GOALS:  SHORT TERM GOALS: Target date: 12/10/22 Ind with a HEP. Goal status: MET  2.  Solid right hip and knee strength to 4+/5 for greater ease in performing ADL's.  Goal status: On going 4/5  3.  Walk a community distance with pain not > 4/10.  Goal status: On going  6/10  4.  Perform all ADL's with pain not > 4/10.  Goal status: On going   6/10    PLAN:  PT FREQUENCY: 2x/week  PT DURATION: 4 weeks  PLANNED INTERVENTIONS: Therapeutic exercises, Therapeutic activity, Neuromuscular re-education, Balance training, Gait  training, Patient/Family education, Self Care, Cryotherapy, Moist heat, Vasopneumatic device, and Manual therapy  PLAN FOR NEXT SESSION: Right LE strengthening and Extension. Recert     [name],[name], PTA 12/24/2022, 6:03 PM

## 2022-12-29 ENCOUNTER — Encounter: Payer: Self-pay | Admitting: *Deleted

## 2022-12-29 ENCOUNTER — Ambulatory Visit: Payer: Medicare Other | Admitting: *Deleted

## 2022-12-29 DIAGNOSIS — G8929 Other chronic pain: Secondary | ICD-10-CM

## 2022-12-29 DIAGNOSIS — M25661 Stiffness of right knee, not elsewhere classified: Secondary | ICD-10-CM | POA: Diagnosis not present

## 2022-12-29 DIAGNOSIS — M6281 Muscle weakness (generalized): Secondary | ICD-10-CM | POA: Diagnosis not present

## 2022-12-29 DIAGNOSIS — M25561 Pain in right knee: Secondary | ICD-10-CM | POA: Diagnosis not present

## 2022-12-29 DIAGNOSIS — R600 Localized edema: Secondary | ICD-10-CM | POA: Diagnosis not present

## 2022-12-29 NOTE — Therapy (Addendum)
 OUTPATIENT PHYSICAL THERAPY LOWER EXTREMITY TREATMENT   Patient Name: [name] MRN: [id number] DOB:02-Oct-1945, 77 y.o., female Today's Date: 12/29/2022  END OF SESSION:  PT End of Session - 12/29/22 1031     Visit Number 13    Number of Visits 14    Date for PT Re-Evaluation 12/30/22    PT Start Time 1015    PT Stop Time 1059    PT Time Calculation (min) 44 min              Past Medical History:  Diagnosis Date   Allergy    seasonal   Anxiety    Arthritis    [place]    Colon polyps    Complication of anesthesia    per pt, she woke up in the middle of last colon in  2014 and it makes her constipated                   2014.   Depression    Diabetes mellitus, type 2 (HCC) 06/2010   Diabetic neuropathy (HCC)    Diverticulosis    pt unaware   Esophageal stricture    Fibromyalgia    [place]    GERD (gastroesophageal reflux disease)    Hemorrhoids    Hiatal hernia    Hyperlipidemia    Hypertension    Menopause    Neuropathy due to medical condition (HCC)    Numbness    Peripheral vascular insufficiency (HCC)    Retinopathy    Bilateral   TIA (transient ischemic attack)    left clouds in eyes went to [facility] happened after a knee surgery   Tremor    Vitamin D  deficiency    Past Surgical History:  Procedure Laterality Date   ABDOMINAL HYSTERECTOMY     partial and then total   ANKLE SURGERY Right    BACK SURGERY     COLONOSCOPY     ELBOW SURGERY     left   KNEE ARTHROSCOPY Right 09/12/2018   Procedure: ARTHROSCOPY KNEE; SYNOVECTOMY;  Surgeon: Melodi Lerner, MD;  [facility];  Service: Orthopedics;  Laterality: Right;    PATELLA REVISION Right 09/10/2021   Procedure: Resection arthroplasty right patella;  Surgeon: Melodi Lerner, MD;  [facility];  Service: Orthopedics;  Laterality: Right;   PATELLECTOMY Right 08/03/2022   Procedure: RIGHT KNEE PATELLECTOMY;  Surgeon: Melodi Lerner, MD;  [facility];  Service: Orthopedics;   Laterality: Right;   REFRACTIVE SURGERY  2003   SPINAL CORD STIMULATOR INSERTION N/A 01/01/2021   Procedure: SPINAL CORD STIMULATOR PLACEMENT;  Surgeon: Burnetta Aures, MD;  Location: [facility];  Service: Orthopedics;  Laterality: N/A;   TOTAL KNEE ARTHROPLASTY     bilateral   TOTAL KNEE REVISION Right 03/09/2018   Procedure: RIGHT TOTAL KNEE REVISION;  Surgeon: Melodi Lerner, MD;  [facility];  Service: Orthopedics;  Laterality: Right;  with abductor block   VESICOVAGINAL FISTULA CLOSURE W/ TAH  1981   Patient Active Problem List   Diagnosis Date Noted   Painful total knee replacement, right (HCC) 08/03/2022   Chronic pain 01/01/2021   Paresthesia 06/14/2019   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) 06/14/2019   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   Aortic atherosclerosis (HCC) 12/08/2017   Hardening of the aorta (main artery of the heart) (HCC) 12/08/2017   History of total knee replacement, bilateral 11/04/2017   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HCC) 01/14/2015  Depression 02/28/2013   GERD (gastroesophageal reflux disease) 02/28/2013   Hiatal hernia with gastroesophageal reflux 02/28/2013   DDD (degenerative disc disease), lumbosacral 06/23/2012   Fibromyalgia syndrome 06/23/2012   Essential hypertension, benign 06/23/2012   Osteopenia 06/23/2012   Diabetes (HCC) 06/23/2012   Obesity due to excess calories 10/29/2010   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   Personal history of colonic polyps 10/11/2009     REFERRING PROVIDER: Dempsey Moan MD  REFERRING DIAG: S/p right patellectomy  THERAPY DIAG:  Stiffness of right knee, not elsewhere classified  Muscle weakness (generalized)  Chronic pain of right knee  Rationale for Evaluation and Treatment: Rehabilitation  ONSET DATE: 08/03/22 (surgery date).  SUBJECTIVE:   SUBJECTIVE STATEMENT:    No new complaints.  Pain at a 5/10 RT knee . To MD  Thursday  PERTINENT HISTORY: Multiple prior right knee surgeries.  Lumbar stimulators. PAIN:  Are you having pain? Yes: NPRS scale: 5/10 Pain location: Right knee. Pain description: Ache. Aggravating factors: As above Relieving factors: As above.  PRECAUTIONS: Other: No ultrasound.   WEIGHT BEARING RESTRICTIONS: No  FALLS:  Has patient fallen in last 6 months? Yes. Number of falls 1.  LIVING ENVIRONMENT: Lives with: lives with their spouse Lives in: House/apartment Has following equipment at home: None  OCCUPATION: Retired.  PLOF: Needs assistance with ADLs  PATIENT GOALS: Be able to walk longer and perform all ADL's.    OBJECTIVE:    PATIENT SURVEYS:  FOTO .  EDEMA:  Circumferential: 1.5 cms greater on right than left.   PALPATION: No significant palpable tenderness of right knee.  LOWER EXTREMITY ROM:  -15 degrees of active right knee extension and active flexion to 130 degrees.  LOWER EXTREMITY MMT:    Notable right quadriceps atrophy and decreased volitional contraction per contralateral comparison with right hip flexion is 4-/5, abduction 4/5 and quad strength is a 4/5.  GAIT: Slow and cautious gait without assistive device.   TODAY'S TREATMENT:                                                                                                                              DATE:                                                                            12/29/22:                                     EXERCISE LOG  RT knee  Exercise Repetitions and Resistance Comments  Nustep Level 5 x 15 minutes.   Knee ext 10# 4x 20  stretch after each set   Ham curls 30#  4x20   stretch after each set   Rocker board X 3 mins calf stretch   Up down steps    In supine:  STW to posterior aspect RT knee , calves and HS's in supine f/b  knee ext stretching with heel propped and contract relax technique used                                                    12/15/22:                                    EXERCISE LOG  Exercise Repetitions and Resistance Comments  Nustep  Level 5 x 17 minutes.   Knee ext 2 minutes   Ham curls 3 minutes           In supine:  Sustained 8 minute knee ext stretch (overpressure and in SLR fashion).      12-10-22                                    EXERCISE LOG                           RT knee  Exercise Repetitions and Resistance Comments  Nustep  L3 x 15 mins   Rocker board Calf stretcing x 3 mins   6in step up 3x10 UE assist   Manual:  1-1 right knee sustained extension stretching via overpressure with QS 4 x bouts SAQs after each stretching bout.3x10 4# wt. X 3 rounds   PATIENT EDUCATION:    HOME EXERCISE PROGRAM: ASSESSMENT:  CLINICAL IMPRESSION: Patient arrived today doing fairly well with Rx focusing on LE strengthening as well as  Right knee extension again. Pt reports F/U with MD this Thursday. We discussed possible transition to gym to continue with strengthening and extension stretching. Supine extension -12 degrees today. LTGs NM due to pain and strength deficits.  OBJECTIVE IMPAIRMENTS: Abnormal gait, decreased activity tolerance, difficulty walking, decreased ROM, increased edema, and pain.   ACTIVITY LIMITATIONS: carrying, lifting, bending, standing, and locomotion level  PARTICIPATION LIMITATIONS: meal prep, cleaning, laundry, and shopping  PERSONAL FACTORS: Time since onset of injury/illness/exacerbation and 1 comorbidity: multiple right knee surgeries are also affecting patient's functional outcome.   REHAB POTENTIAL: Good  CLINICAL DECISION MAKING: Stable/uncomplicated  EVALUATION COMPLEXITY: Low   GOALS:  SHORT TERM GOALS: Target date: 12/10/22 Ind with a HEP. Goal status: MET  2.  Solid right hip and knee strength to 4+/5 for greater ease in performing ADL's.  Goal status: On going 4/5  3.  Walk a community distance with pain not > 4/10.  Goal status: On going   5/10  4.  Perform all ADL's with pain not > 4/10.  Goal status: On going   5/10    PLAN:  PT FREQUENCY: 2x/week  PT DURATION: 4 weeks  PLANNED INTERVENTIONS: Therapeutic exercises, Therapeutic activity, Neuromuscular re-education, Balance training, Gait training, Patient/Family education, Self Care, Cryotherapy, Moist heat, Vasopneumatic device, and Manual therapy  PLAN FOR NEXT SESSION: Right LE strengthening and Extension.  Franceen Erisman,CHRIS, PTA 12/29/2022, 11:08 AM   PHYSICAL THERAPY DISCHARGE SUMMARY  Visits from Start of Care: 13.  Current functional level related to goals / functional outcomes: See above.   Remaining deficits: See below.   Education / Equipment: HEP.   Patient agrees to discharge. Patient goals were partially met. Patient is being discharged due to lack of progress.    Chad Applegate MPT

## 2022-12-31 ENCOUNTER — Encounter: Payer: Medicare Other | Admitting: Physical Therapy

## 2022-12-31 DIAGNOSIS — Z471 Aftercare following joint replacement surgery: Secondary | ICD-10-CM | POA: Diagnosis not present

## 2022-12-31 DIAGNOSIS — Z96651 Presence of right artificial knee joint: Secondary | ICD-10-CM | POA: Diagnosis not present

## 2023-01-01 ENCOUNTER — Encounter: Payer: Medicare Other | Admitting: *Deleted

## 2023-01-06 ENCOUNTER — Ambulatory Visit: Payer: Medicare Other | Admitting: Family Medicine

## 2023-01-06 ENCOUNTER — Encounter: Payer: Self-pay | Admitting: Family Medicine

## 2023-01-06 ENCOUNTER — Telehealth: Payer: Self-pay | Admitting: Family Medicine

## 2023-01-06 VITALS — BP 129/71 | HR 107 | Ht 66.0 in | Wt 197.0 lb

## 2023-01-06 DIAGNOSIS — I10 Essential (primary) hypertension: Secondary | ICD-10-CM

## 2023-01-06 DIAGNOSIS — Z79899 Other long term (current) drug therapy: Secondary | ICD-10-CM

## 2023-01-06 DIAGNOSIS — E1159 Type 2 diabetes mellitus with other circulatory complications: Secondary | ICD-10-CM

## 2023-01-06 DIAGNOSIS — E1142 Type 2 diabetes mellitus with diabetic polyneuropathy: Secondary | ICD-10-CM

## 2023-01-06 DIAGNOSIS — F320 Major depressive disorder, single episode, mild: Secondary | ICD-10-CM

## 2023-01-06 DIAGNOSIS — Z7985 Long-term (current) use of injectable non-insulin antidiabetic drugs: Secondary | ICD-10-CM

## 2023-01-06 DIAGNOSIS — E1143 Type 2 diabetes mellitus with diabetic autonomic (poly)neuropathy: Secondary | ICD-10-CM

## 2023-01-06 DIAGNOSIS — E11610 Type 2 diabetes mellitus with diabetic neuropathic arthropathy: Secondary | ICD-10-CM

## 2023-01-06 DIAGNOSIS — Z23 Encounter for immunization: Secondary | ICD-10-CM

## 2023-01-06 DIAGNOSIS — Z7984 Long term (current) use of oral hypoglycemic drugs: Secondary | ICD-10-CM

## 2023-01-06 LAB — BAYER DCA HB A1C WAIVED: HB A1C (BAYER DCA - WAIVED): 6.7 % — ABNORMAL HIGH (ref 4.8–5.6)

## 2023-01-06 MED ORDER — PREGABALIN 50 MG PO CAPS
50.0000 mg | ORAL_CAPSULE | Freq: Two times a day (BID) | ORAL | 2 refills | Status: DC
Start: 2023-01-06 — End: 2023-07-13

## 2023-01-06 MED ORDER — ALPRAZOLAM 0.5 MG PO TABS: 0.5000 mg | ORAL_TABLET | Freq: Every day | ORAL | 2 refills | Status: DC | PRN

## 2023-01-06 NOTE — Telephone Encounter (Signed)
Appt scheduled for today at 1:55pm  Left message making pt aware. Advised to call back if unable to attend.

## 2023-01-06 NOTE — Progress Notes (Signed)
BP 129/71   Pulse (!) 107   Ht 5\' 6"  (1.676 m)   Wt 197 lb (89.4 kg)   SpO2 95%   BMI 31.80 kg/m    Subjective:   [name], female    DOB: 06/08/1945, 77 y.o.   MRN: [id number]  HPI: [name] is a 77 y.o. female presenting on 01/06/2023 for Medical Management of Chronic Issues, Diabetes, and Anxiety   HPI Anxiety recheck Current rx-alprazolam 0.5 mg nightly as needed # meds rx-30 Effectiveness of current meds-works well Adverse reactions form meds-none Pill count performed-No Last drug screen -01/15/2021 ( high risk q56m, moderate risk q26m, low risk yearly ) Urine drug screen today- Yes Was the NCCSR reviewed-yes  If yes were their any concerning findings? -Also has hydrocodone from another provider, encouraged that she did not get them together Controlled substance contract signed on: Today  Type 2 diabetes mellitus Patient comes in today for recheck of his diabetes. Patient has been currently taking glipizide and Ozempic and Synjardy. Patient is currently on an ACE inhibitor/ARB. Patient has not seen an ophthalmologist this year. Patient denies any new issues with their feet. The symptom started onset as an adult hypertension ARE RELATED TO DM   Hypertension Patient is currently on lisinopril hydrochlorothiazide, and their blood pressure today is 129/71. Patient denies any lightheadedness or dizziness. Patient denies headaches, blurred vision, chest pains, shortness of breath, or weakness. Denies any side effects from medication and is content with current medication.   Relevant past medical, surgical, family and social history reviewed and updated as indicated. Interim medical history since our last visit reviewed. Allergies and medications reviewed and updated.  Review of Systems  Constitutional:  Negative for chills and fever.  Eyes:  Negative for redness and visual disturbance.  Respiratory:  Negative for chest tightness and shortness of breath.    Cardiovascular:  Negative for chest pain and leg swelling.  Genitourinary:  Negative for difficulty urinating and dysuria.  Musculoskeletal:  Positive for myalgias. Negative for back pain and gait problem.  Skin:  Negative for rash.  Neurological:  Negative for light-headedness and headaches.  Psychiatric/Behavioral:  Positive for sleep disturbance. Negative for agitation and behavioral problems. The patient is nervous/anxious.   All other systems reviewed and are negative.   Per HPI unless specifically indicated above   Allergies as of 01/06/2023       Reactions   Penicillins Other (See Comments)   Blisters in mouth Did it involve swelling of the face/tongue/throat, SOB, or low BP? No Did it involve sudden or severe rash/hives, skin peeling, or any reaction on the inside of your mouth or nose? No Did you need to seek medical attention at a hospital or doctor's office? No When did it last happen?      Childhood allergy If all above answers are "NO", may proceed with cephalosporin use. Tolerated Cephalosporin Date: 08/04/22.   Codeine Nausea And Vomiting   Erythromycin Nausea And Vomiting   Fenofibrate Swelling   aching & edema    Lyrica [pregabalin] Swelling   Edema in feet   Statins Other (See Comments)   Joints ache   Sulfa Antibiotics Nausea And Vomiting        Medication List        Accurate as of January 06, 2023  2:36 PM. If you have any questions, ask your nurse or doctor.          ALPRAZolam 0.5 MG tablet Commonly known  as: XANAX Take 1-2 tablets (0.5-1 mg total) by mouth daily as needed for anxiety. Take 1-2 tabs daily as needed   ARIPiprazole 2 MG tablet Commonly known as: ABILIFY Take 1 tablet (2 mg total) by mouth daily.   Aspercreme Lidocaine 4 % Liqd Generic drug: Lidocaine HCl Apply 1 spray topically at bedtime as needed (foot pain).   atorvastatin 10 MG tablet Commonly known as: LIPITOR Take 1 tablet (10 mg total) by mouth every Monday,  Wednesday, and Friday.   Australian Tea Tree 100 % Oil Apply 1 application  topically at bedtime as needed (foot pain).   blood glucose meter kit and supplies Kit Dispense based on patient and insurance preference. Use up to two times daily as directed. (Dx: type 2 DM - E11.9)   docusate sodium 100 MG capsule Commonly known as: COLACE Take 1 capsule (100 mg total) by mouth every 12 (twelve) hours. What changed:  when to take this reasons to take this   escitalopram 20 MG tablet Commonly known as: LEXAPRO Take 1 tablet (20 mg total) by mouth daily.   ezetimibe 10 MG tablet Commonly known as: ZETIA Take 1 tablet (10 mg total) by mouth daily.   glipiZIDE 5 MG 24 hr tablet Commonly known as: GLUCOTROL XL Take 1 tablet (5 mg total) by mouth daily with breakfast.   HYDROcodone-acetaminophen 10-325 MG tablet Commonly known as: NORCO Take 1 tablet by mouth every 6 (six) hours as needed for moderate pain (Right knee replaced).   lisinopril-hydrochlorothiazide 20-25 MG tablet Commonly known as: ZESTORETIC Take 1 tablet by mouth daily.   meclizine 25 MG tablet Commonly known as: ANTIVERT TAKE ONE TABLET THREE TIMES DAILY AS NEEDED FOR DIZZINESS Strength: 25 mg   methocarbamol 500 MG tablet Commonly known as: ROBAXIN Take 1 tablet (500 mg total) by mouth every 6 (six) hours as needed for muscle spasms.   nicotine polacrilex 2 MG gum Commonly known as: NICORETTE Take 2 mg by mouth as needed for smoking cessation.   ONE TOUCH ULTRA 2 w/Device Kit CHECK BLOOD SUGAR UP TO TWICE DAILY Dx E11.9   OneTouch Delica Lancets 33G Misc CHECK BLOOD SUGAR UP TO TWICE DAILY Dx E11.9   OneTouch Ultra test strip Generic drug: glucose blood Check BS up to 2 times daily Dx 11.43   Ozempic (1 MG/DOSE) 4 MG/3ML Sopn Generic drug: Semaglutide (1 MG/DOSE) Inject 1 mg into the skin once a week.   pantoprazole 40 MG tablet Commonly known as: PROTONIX Take 1 tablet (40 mg total) by mouth  daily.   polyethylene glycol 17 g packet Commonly known as: MIRALAX / GLYCOLAX Take 17 g by mouth 2 (two) times daily as needed (constipation).   pregabalin 50 MG capsule Commonly known as: Lyrica Take 1 capsule (50 mg total) by mouth 2 (two) times daily.   Premarin vaginal cream Generic drug: conjugated estrogens USE VAGINALLY AT BEDTIME AS DIRECTED What changed: See the new instructions.   Synjardy XR 25-1000 MG Tb24 Generic drug: Empagliflozin-metFORMIN HCl ER TAKE 1 TABLET BY MOUTH EVERY DAY   Vitamin D 50 MCG (2000 UT) tablet Take 2,000 Units by mouth every morning.         Objective:   BP 129/71   Pulse (!) 107   Ht 5\' 6"  (1.676 m)   Wt 197 lb (89.4 kg)   SpO2 95%   BMI 31.80 kg/m   Wt Readings from Last 3 Encounters:  01/06/23 197 lb (89.4 kg)  10/01/22 191 lb (  86.6 kg)  08/03/22 197 lb 8.5 oz (89.6 kg)    Physical Exam Vitals and nursing note reviewed.  Constitutional:      General: She is not in acute distress.    Appearance: She is well-developed. She is not diaphoretic.  Eyes:     Conjunctiva/sclera: Conjunctivae normal.  Cardiovascular:     Rate and Rhythm: Normal rate and regular rhythm.     Heart sounds: Normal heart sounds. No murmur heard. Pulmonary:     Effort: Pulmonary effort is normal. No respiratory distress.     Breath sounds: Normal breath sounds. No wheezing.  Musculoskeletal:        General: No tenderness. Normal range of motion.  Skin:    General: Skin is warm and dry.     Findings: No rash.  Neurological:     Mental Status: She is alert and oriented to person, place, and time.     Coordination: Coordination normal.  Psychiatric:        Behavior: Behavior normal.       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  No change in medication.  Sent refills for the patient.  Follow up plan: Return in about 3 months (around 04/08/2023), or if symptoms worsen or fail to improve, for Anxiety diabetes and hypertension.  Counseling provided for all of the vaccine components Orders Placed This Encounter  Procedures   CBC with Differential/Platelet   CMP14+EGFR   Lipid panel   Bayer DCA Hb A1c Waived   Microalbumin / creatinine urine ratio   ToxASSURE Select 13 (MW), Urine    Arville Care, MD [facility] 01/06/2023, 2:36 PM

## 2023-01-06 NOTE — Telephone Encounter (Signed)
Pt missed her appt this morning. I called pt to get the appt rescheduled. Spoke with pt and was able to get appt rescheduled for 02/08/23 (first available). Pt says she will be out of her medications before then. Explained to pt that I could request for Dr Dettinger to send in refill on her meds that would last her until her appt, but would not be able to request refill on any controlled meds per her controlled contract agreement that she signed. Pt said she needs her xanax for sleep. Explained to pt that I understood but that we have to follow the contract she signed and the only thing we could do is add her to Dr Oris Drone cancellation list and if something opened up, we may be able to call her to get her scheduled for sooner date/time. Pt voiced understanding.    Prescription Request  01/06/2023  What is the name of the medication or equipment? ALL NON CONTROLLED MEDS  Have you contacted your pharmacy to request a refill? YES  Which pharmacy would you like this sent to? [facility]   Patient notified that their request is being sent to the clinical staff for review and that they should receive a response within 2 business days.

## 2023-01-07 LAB — CBC WITH DIFFERENTIAL/PLATELET
Basophils Absolute: 0.1 10*3/uL (ref 0.0–0.2)
Basos: 1 %
EOS (ABSOLUTE): 0.2 10*3/uL (ref 0.0–0.4)
Eos: 3 %
Hematocrit: 44.5 % (ref 34.0–46.6)
Hemoglobin: 14.2 g/dL (ref 11.1–15.9)
Immature Grans (Abs): 0 10*3/uL (ref 0.0–0.1)
Immature Granulocytes: 0 %
Lymphocytes Absolute: 2.2 10*3/uL (ref 0.7–3.1)
Lymphs: 27 %
MCH: 28 pg (ref 26.6–33.0)
MCHC: 31.9 g/dL (ref 31.5–35.7)
MCV: 88 fL (ref 79–97)
Monocytes Absolute: 1 10*3/uL — ABNORMAL HIGH (ref 0.1–0.9)
Monocytes: 11 %
Neutrophils Absolute: 4.9 10*3/uL (ref 1.4–7.0)
Neutrophils: 58 %
Platelets: 240 10*3/uL (ref 150–450)
RBC: 5.08 x10E6/uL (ref 3.77–5.28)
RDW: 14.2 % (ref 11.7–15.4)
WBC: 8.4 10*3/uL (ref 3.4–10.8)

## 2023-01-07 LAB — LIPID PANEL
Cholesterol, Total: 160 mg/dL (ref 100–199)
HDL: 43 mg/dL (ref >39)
LDL CALC COMMENT:: 3.7 ratio (ref 0.0–4.4)
LDL Chol Calc (NIH): 77 mg/dL (ref 0–99)
Triglycerides: 245 mg/dL — ABNORMAL HIGH (ref 0–149)
VLDL Cholesterol Cal: 40 mg/dL (ref 5–40)

## 2023-01-07 LAB — CMP14+EGFR
ALT: 23 IU/L (ref 0–32)
AST: 30 IU/L (ref 0–40)
Albumin: 4 g/dL (ref 3.8–4.8)
Alkaline Phosphatase: 83 IU/L (ref 44–121)
BUN/Creatinine Ratio: 11 — ABNORMAL LOW (ref 12–28)
BUN: 11 mg/dL (ref 8–27)
Bilirubin Total: 0.3 mg/dL (ref 0.0–1.2)
CO2: 23 mmol/L (ref 20–29)
Calcium: 9.6 mg/dL (ref 8.7–10.3)
Chloride: 96 mmol/L (ref 96–106)
Creatinine, Ser: 1.01 mg/dL — ABNORMAL HIGH (ref 0.57–1.00)
Globulin, Total: 2.6 g/dL (ref 1.5–4.5)
Glucose: 81 mg/dL (ref 70–99)
Potassium: 4.4 mmol/L (ref 3.5–5.2)
Sodium: 137 mmol/L (ref 134–144)
Total Protein: 6.6 g/dL (ref 6.0–8.5)
eGFR: 57 mL/min/{1.73_m2} — ABNORMAL LOW (ref >59)

## 2023-01-07 LAB — MICROALBUMIN / CREATININE URINE RATIO
Creatinine, Urine: 57.9 mg/dL
Microalb/Creat Ratio: <5 mg/g{creat} (ref 0–29)
Microalbumin, Urine: <3 ug/mL

## 2023-01-11 LAB — TOXASSURE SELECT 13 (MW), URINE

## 2023-01-13 ENCOUNTER — Other Ambulatory Visit: Payer: Self-pay | Admitting: Family Medicine

## 2023-01-13 DIAGNOSIS — F320 Major depressive disorder, single episode, mild: Secondary | ICD-10-CM

## 2023-01-14 ENCOUNTER — Ambulatory Visit: Payer: Medicare Other | Admitting: Family Medicine

## 2023-01-25 DIAGNOSIS — K080 Exfoliation of teeth due to systemic causes: Secondary | ICD-10-CM | POA: Diagnosis not present

## 2023-01-26 DIAGNOSIS — Z5181 Encounter for therapeutic drug level monitoring: Secondary | ICD-10-CM | POA: Diagnosis not present

## 2023-01-26 DIAGNOSIS — Z79899 Other long term (current) drug therapy: Secondary | ICD-10-CM | POA: Diagnosis not present

## 2023-01-26 DIAGNOSIS — M51362 Other intervertebral disc degeneration, lumbar region with discogenic back pain and lower extremity pain: Secondary | ICD-10-CM | POA: Diagnosis not present

## 2023-01-26 DIAGNOSIS — Z96653 Presence of artificial knee joint, bilateral: Secondary | ICD-10-CM | POA: Diagnosis not present

## 2023-01-26 DIAGNOSIS — Z9189 Other specified personal risk factors, not elsewhere classified: Secondary | ICD-10-CM | POA: Diagnosis not present

## 2023-02-01 DIAGNOSIS — K080 Exfoliation of teeth due to systemic causes: Secondary | ICD-10-CM | POA: Diagnosis not present

## 2023-02-03 ENCOUNTER — Ambulatory Visit: Payer: Medicare Other | Admitting: Family Medicine

## 2023-02-08 ENCOUNTER — Ambulatory Visit: Payer: Medicare Other | Admitting: Family Medicine

## 2023-02-10 ENCOUNTER — Other Ambulatory Visit: Payer: Self-pay | Admitting: Family Medicine

## 2023-02-10 DIAGNOSIS — F320 Major depressive disorder, single episode, mild: Secondary | ICD-10-CM

## 2023-02-10 DIAGNOSIS — E11610 Type 2 diabetes mellitus with diabetic neuropathic arthropathy: Secondary | ICD-10-CM

## 2023-02-10 DIAGNOSIS — E1143 Type 2 diabetes mellitus with diabetic autonomic (poly)neuropathy: Secondary | ICD-10-CM

## 2023-02-10 DIAGNOSIS — E1142 Type 2 diabetes mellitus with diabetic polyneuropathy: Secondary | ICD-10-CM

## 2023-02-25 DIAGNOSIS — H04129 Dry eye syndrome of unspecified lacrimal gland: Secondary | ICD-10-CM | POA: Diagnosis not present

## 2023-02-25 DIAGNOSIS — H16142 Punctate keratitis, left eye: Secondary | ICD-10-CM | POA: Diagnosis not present

## 2023-03-11 DIAGNOSIS — H04123 Dry eye syndrome of bilateral lacrimal glands: Secondary | ICD-10-CM | POA: Diagnosis not present

## 2023-03-22 ENCOUNTER — Ambulatory Visit: Payer: Medicare Other | Admitting: Family Medicine

## 2023-03-25 DIAGNOSIS — H04123 Dry eye syndrome of bilateral lacrimal glands: Secondary | ICD-10-CM | POA: Diagnosis not present

## 2023-03-25 DIAGNOSIS — H16123 Filamentary keratitis, bilateral: Secondary | ICD-10-CM | POA: Diagnosis not present

## 2023-03-25 DIAGNOSIS — K080 Exfoliation of teeth due to systemic causes: Secondary | ICD-10-CM | POA: Diagnosis not present

## 2023-04-09 ENCOUNTER — Other Ambulatory Visit: Payer: Medicare Other

## 2023-04-09 DIAGNOSIS — E1142 Type 2 diabetes mellitus with diabetic polyneuropathy: Secondary | ICD-10-CM | POA: Diagnosis not present

## 2023-04-09 LAB — BAYER DCA HB A1C WAIVED: HB A1C (BAYER DCA - WAIVED): 6.9 % — ABNORMAL HIGH (ref 4.8–5.6)

## 2023-04-22 ENCOUNTER — Ambulatory Visit: Payer: Medicare Other | Admitting: Family Medicine

## 2023-04-22 ENCOUNTER — Encounter: Payer: Self-pay | Admitting: Family Medicine

## 2023-04-22 ENCOUNTER — Other Ambulatory Visit: Payer: Self-pay | Admitting: Family Medicine

## 2023-04-22 VITALS — BP 109/74 | HR 88 | Ht 66.0 in | Wt 197.0 lb

## 2023-04-22 DIAGNOSIS — Z79899 Other long term (current) drug therapy: Secondary | ICD-10-CM

## 2023-04-22 DIAGNOSIS — E11610 Type 2 diabetes mellitus with diabetic neuropathic arthropathy: Secondary | ICD-10-CM | POA: Diagnosis not present

## 2023-04-22 DIAGNOSIS — E1142 Type 2 diabetes mellitus with diabetic polyneuropathy: Secondary | ICD-10-CM

## 2023-04-22 DIAGNOSIS — I10 Essential (primary) hypertension: Secondary | ICD-10-CM | POA: Diagnosis not present

## 2023-04-22 DIAGNOSIS — Z7985 Long-term (current) use of injectable non-insulin antidiabetic drugs: Secondary | ICD-10-CM

## 2023-04-22 DIAGNOSIS — F320 Major depressive disorder, single episode, mild: Secondary | ICD-10-CM

## 2023-04-22 DIAGNOSIS — E1143 Type 2 diabetes mellitus with diabetic autonomic (poly)neuropathy: Secondary | ICD-10-CM | POA: Diagnosis not present

## 2023-04-22 LAB — BAYER DCA HB A1C WAIVED: HB A1C (BAYER DCA - WAIVED): 7.3 % — ABNORMAL HIGH (ref 4.8–5.6)

## 2023-04-22 MED ORDER — BENZONATATE 100 MG PO CAPS: 200.0000 mg | ORAL_CAPSULE | Freq: Three times a day (TID) | ORAL | 0 refills | Status: DC | PRN

## 2023-04-22 NOTE — Progress Notes (Signed)
BP 109/74   Pulse 88   Ht 5\' 6"  (1.676 m)   Wt 197 lb (89.4 kg)   SpO2 98%   BMI 31.80 kg/m    Subjective:   [name], female    DOB: November 21, 1945, 78 y.o.   MRN: [id number]  HPI: [name] is a 78 y.o. female presenting on 04/22/2023 for Medical Management of Chronic Issues, Diabetes, and Anxiety   HPI Type 2 diabetes mellitus Patient comes in today for recheck of his diabetes. Patient has been currently taking glipizide and Synjardy and Ozempic. Patient is currently on an ACE inhibitor/ARB. Patient has not seen an ophthalmologist this year. Patient denies any new issues with their feet. The symptom started onset as an adult hypertension and neuropathy and aortic atherosclerosis ARE RELATED TO DM   Hypertension Patient is currently on lisinopril-hydrochlorothiazide, and their blood pressure today is 109/74. Patient denies any lightheadedness or dizziness. Patient denies headaches, blurred vision, chest pains, shortness of breath, or weakness. Denies any side effects from medication and is content with current medication.   Depression recheck Patient is coming in today for depression recheck.  She currently takes Abilify and Lexapro and uses alprazolam 0.5 mg as needed Current rx-alprazolam 0.5 mg take 1 to 2 tablets daily as needed # meds rx-30/month Effectiveness of current meds-works well, does not use it all of the time Adverse reactions form meds-none Pill count performed-No Last drug screen -01/19/2023 ( high risk q78m, moderate risk q106m, low risk yearly ) Urine drug screen today- No Was the NCCSR reviewed-yes  If yes were their any concerning findings? -None Controlled substance contract signed on: 01/19/2023  Relevant past medical, surgical, family and social history reviewed and updated as indicated. Interim medical history since our last visit reviewed. Allergies and medications reviewed and updated.  Review of Systems  Constitutional:  Negative  for chills and fever.  HENT:  Positive for congestion. Negative for ear discharge and ear pain.   Eyes:  Negative for visual disturbance.  Respiratory:  Positive for cough (Patient has been having a little bit of cough but no wheezing.). Negative for chest tightness and shortness of breath.   Cardiovascular:  Negative for chest pain and leg swelling.  Genitourinary:  Negative for difficulty urinating and dysuria.  Musculoskeletal:  Negative for back pain and gait problem.  Skin:  Negative for rash.  Neurological:  Negative for light-headedness and headaches.  Psychiatric/Behavioral:  Negative for agitation and behavioral problems.   All other systems reviewed and are negative.   Per HPI unless specifically indicated above   Allergies as of 04/22/2023       Reactions   Penicillins Other (See Comments)   Blisters in mouth Did it involve swelling of the face/tongue/throat, SOB, or low BP? No Did it involve sudden or severe rash/hives, skin peeling, or any reaction on the inside of your mouth or nose? No Did you need to seek medical attention at a hospital or doctor's office? No When did it last happen?      Childhood allergy If all above answers are "NO", may proceed with cephalosporin use. Tolerated Cephalosporin Date: 08/04/22.   Codeine Nausea And Vomiting   Erythromycin Nausea And Vomiting   Fenofibrate Swelling   aching & edema    Lyrica [pregabalin] Swelling   Edema in feet   Statins Other (See Comments)   Joints ache   Sulfa Antibiotics Nausea And Vomiting        Medication List  Accurate as of April 22, 2023  3:09 PM. If you have any questions, ask your nurse or doctor.          ALPRAZolam 0.5 MG tablet Commonly known as: XANAX Take 1-2 tablets (0.5-1 mg total) by mouth daily as needed for anxiety. Take 1-2 tabs daily as needed   ARIPiprazole 2 MG tablet Commonly known as: ABILIFY Take 1 tablet (2 mg total) by mouth daily.   Aspercreme Lidocaine 4  % Liqd Generic drug: Lidocaine HCl Apply 1 spray topically at bedtime as needed (foot pain).   atorvastatin 10 MG tablet Commonly known as: LIPITOR Take 1 tablet (10 mg total) by mouth every Monday, Wednesday, and Friday.   Australian Tea Tree 100 % Oil Apply 1 application  topically at bedtime as needed (foot pain).   benzonatate 100 MG capsule Commonly known as: Tessalon Perles Take 2 capsules (200 mg total) by mouth 3 (three) times daily as needed for cough. Started by: Elige Radon Hayes Rehfeldt   blood glucose meter kit and supplies Kit Dispense based on patient and insurance preference. Use up to two times daily as directed. (Dx: type 2 DM - E11.9)   docusate sodium 100 MG capsule Commonly known as: COLACE Take 1 capsule (100 mg total) by mouth every 12 (twelve) hours. What changed:  when to take this reasons to take this   escitalopram 20 MG tablet Commonly known as: LEXAPRO TAKE 1 TABLET BY MOUTH EVERY DAY   ezetimibe 10 MG tablet Commonly known as: ZETIA Take 1 tablet (10 mg total) by mouth daily.   glipiZIDE 5 MG 24 hr tablet Commonly known as: GLUCOTROL XL Take 1 tablet (5 mg total) by mouth daily with breakfast.   HYDROcodone-acetaminophen 10-325 MG tablet Commonly known as: NORCO Take 1 tablet by mouth every 6 (six) hours as needed for moderate pain (Right knee replaced).   lisinopril-hydrochlorothiazide 20-25 MG tablet Commonly known as: ZESTORETIC Take 1 tablet by mouth daily.   meclizine 25 MG tablet Commonly known as: ANTIVERT TAKE ONE TABLET THREE TIMES DAILY AS NEEDED FOR DIZZINESS Strength: 25 mg   methocarbamol 500 MG tablet Commonly known as: ROBAXIN Take 1 tablet (500 mg total) by mouth every 6 (six) hours as needed for muscle spasms.   nicotine polacrilex 2 MG gum Commonly known as: NICORETTE Take 2 mg by mouth as needed for smoking cessation.   ONE TOUCH ULTRA 2 w/Device Kit CHECK BLOOD SUGAR UP TO TWICE DAILY Dx E11.9   OneTouch Delica  Lancets 33G Misc CHECK BLOOD SUGAR UP TO TWICE DAILY Dx E11.9   OneTouch Ultra test strip Generic drug: glucose blood Check BS up to 2 times daily Dx 11.43   Ozempic (1 MG/DOSE) 4 MG/3ML Sopn Generic drug: Semaglutide (1 MG/DOSE) Inject 1 mg into the skin once a week.   pantoprazole 40 MG tablet Commonly known as: PROTONIX Take 1 tablet (40 mg total) by mouth daily.   polyethylene glycol 17 g packet Commonly known as: MIRALAX / GLYCOLAX Take 17 g by mouth 2 (two) times daily as needed (constipation).   pregabalin 50 MG capsule Commonly known as: Lyrica Take 1 capsule (50 mg total) by mouth 2 (two) times daily.   Premarin vaginal cream Generic drug: conjugated estrogens USE VAGINALLY AT BEDTIME AS DIRECTED What changed: See the new instructions.   Synjardy XR 25-1000 MG Tb24 Generic drug: Empagliflozin-metFORMIN HCl ER TAKE 1 TABLET BY MOUTH EVERY DAY   Vitamin D 50 MCG (2000 UT) tablet Take 2,000  Units by mouth every morning.         Objective:   BP 109/74   Pulse 88   Ht 5\' 6"  (1.676 m)   Wt 197 lb (89.4 kg)   SpO2 98%   BMI 31.80 kg/m   Wt Readings from Last 3 Encounters:  04/22/23 197 lb (89.4 kg)  01/06/23 197 lb (89.4 kg)  10/01/22 191 lb (86.6 kg)    Physical Exam Vitals and nursing note reviewed.  Constitutional:      General: She is not in acute distress.    Appearance: She is well-developed. She is not diaphoretic.  Eyes:     Conjunctiva/sclera: Conjunctivae normal.  Cardiovascular:     Rate and Rhythm: Normal rate and regular rhythm.     Heart sounds: Normal heart sounds. No murmur heard. Pulmonary:     Effort: Pulmonary effort is normal. No respiratory distress.     Breath sounds: Normal breath sounds. No wheezing.  Skin:    General: Skin is warm and dry.     Findings: No rash.  Neurological:     Mental Status: She is alert and oriented to person, place, and time.     Coordination: Coordination normal.  Psychiatric:        Behavior:  Behavior normal.       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  Blood pressure and everything else looks good.    Follow up plan: Return in about 3 months (around 07/21/2023), or if symptoms worsen or fail to improve, for Diabetes and anxiety.  Counseling provided for all of the vaccine components Orders Placed This Encounter  Procedures   Bayer DCA Hb A1c Waived    Arville Care, MD [facility] 04/22/2023, 3:09 PM

## 2023-04-23 ENCOUNTER — Other Ambulatory Visit: Payer: Self-pay | Admitting: Family Medicine

## 2023-04-23 ENCOUNTER — Other Ambulatory Visit: Payer: Self-pay

## 2023-04-23 DIAGNOSIS — Z79899 Other long term (current) drug therapy: Secondary | ICD-10-CM

## 2023-04-23 DIAGNOSIS — F320 Major depressive disorder, single episode, mild: Secondary | ICD-10-CM

## 2023-04-23 DIAGNOSIS — R42 Dizziness and giddiness: Secondary | ICD-10-CM

## 2023-04-23 MED ORDER — ESCITALOPRAM OXALATE 20 MG PO TABS: 20.0000 mg | ORAL_TABLET | Freq: Every day | ORAL | 3 refills | Status: AC

## 2023-04-23 MED ORDER — MECLIZINE HCL 25 MG PO TABS: ORAL_TABLET | ORAL | 1 refills | Status: AC

## 2023-04-23 MED ORDER — ALPRAZOLAM 0.5 MG PO TABS: 0.5000 mg | ORAL_TABLET | Freq: Every day | ORAL | 2 refills | Status: DC | PRN

## 2023-04-23 NOTE — Progress Notes (Signed)
Sent refill for the patient 

## 2023-04-23 NOTE — Telephone Encounter (Signed)
Copied from CRM [id number]. Topic: Clinical - Prescription Issue >> Apr 22, 2023  5:57 PM Antony Haste wrote: Reason for CRM: This patient attempted to pick-up her ALPRAZolam Prudy Feeler) 0.5 MG tablet [facility] she was informed to schedule an appointment with PCP, she just recently had a visit today on 01/16. This medication has been reordered today as well, she would like to make sure this can be signed off by Dr. Louanne Skye before the weekend if possible.

## 2023-05-27 DIAGNOSIS — M546 Pain in thoracic spine: Secondary | ICD-10-CM | POA: Diagnosis not present

## 2023-05-27 DIAGNOSIS — Z96653 Presence of artificial knee joint, bilateral: Secondary | ICD-10-CM | POA: Diagnosis not present

## 2023-05-27 DIAGNOSIS — M51362 Other intervertebral disc degeneration, lumbar region with discogenic back pain and lower extremity pain: Secondary | ICD-10-CM | POA: Diagnosis not present

## 2023-05-27 DIAGNOSIS — Z9189 Other specified personal risk factors, not elsewhere classified: Secondary | ICD-10-CM | POA: Diagnosis not present

## 2023-07-14 ENCOUNTER — Other Ambulatory Visit: Payer: Self-pay | Admitting: Family Medicine

## 2023-07-14 DIAGNOSIS — E1143 Type 2 diabetes mellitus with diabetic autonomic (poly)neuropathy: Secondary | ICD-10-CM

## 2023-07-16 ENCOUNTER — Encounter: Payer: Self-pay | Admitting: Family Medicine

## 2023-07-16 ENCOUNTER — Ambulatory Visit (INDEPENDENT_AMBULATORY_CARE_PROVIDER_SITE_OTHER): Payer: Medicare Other | Admitting: Family Medicine

## 2023-07-16 VITALS — BP 84/56 | HR 95 | Ht 66.0 in | Wt 193.0 lb

## 2023-07-16 DIAGNOSIS — Z7985 Long-term (current) use of injectable non-insulin antidiabetic drugs: Secondary | ICD-10-CM | POA: Diagnosis not present

## 2023-07-16 DIAGNOSIS — I10 Essential (primary) hypertension: Secondary | ICD-10-CM | POA: Diagnosis not present

## 2023-07-16 DIAGNOSIS — E1143 Type 2 diabetes mellitus with diabetic autonomic (poly)neuropathy: Secondary | ICD-10-CM | POA: Diagnosis not present

## 2023-07-16 DIAGNOSIS — E11610 Type 2 diabetes mellitus with diabetic neuropathic arthropathy: Secondary | ICD-10-CM | POA: Diagnosis not present

## 2023-07-16 DIAGNOSIS — F320 Major depressive disorder, single episode, mild: Secondary | ICD-10-CM

## 2023-07-16 DIAGNOSIS — Z79899 Other long term (current) drug therapy: Secondary | ICD-10-CM | POA: Diagnosis not present

## 2023-07-16 DIAGNOSIS — E1142 Type 2 diabetes mellitus with diabetic polyneuropathy: Secondary | ICD-10-CM

## 2023-07-16 LAB — BAYER DCA HB A1C WAIVED: HB A1C (BAYER DCA - WAIVED): 6.7 % — ABNORMAL HIGH (ref 4.8–5.6)

## 2023-07-16 MED ORDER — ONETOUCH ULTRA VI STRP: ORAL_STRIP | 3 refills | Status: AC

## 2023-07-16 MED ORDER — ONETOUCH DELICA LANCETS 33G MISC: 3 refills | Status: AC

## 2023-07-16 MED ORDER — PREGABALIN 50 MG PO CAPS: 50.0000 mg | ORAL_CAPSULE | Freq: Two times a day (BID) | ORAL | 2 refills | Status: DC

## 2023-07-16 MED ORDER — SYNJARDY XR 25-1000 MG PO TB24: 1.0000 | ORAL_TABLET | Freq: Every day | ORAL | 3 refills | Status: DC

## 2023-07-16 MED ORDER — ALPRAZOLAM 0.5 MG PO TABS: 0.5000 mg | ORAL_TABLET | Freq: Every day | ORAL | 2 refills | Status: DC | PRN

## 2023-07-16 MED ORDER — LISINOPRIL-HYDROCHLOROTHIAZIDE 10-12.5 MG PO TABS
1.0000 | ORAL_TABLET | Freq: Every day | ORAL | 3 refills | Status: DC
Start: 2023-07-16 — End: 2023-07-28

## 2023-07-16 NOTE — Progress Notes (Signed)
 BP (!) 84/56   Pulse 95   Ht 5\' 6"  (1.676 m)   Wt 193 lb (87.5 kg)   SpO2 95%   BMI 31.15 kg/m    Subjective:   [name], female    DOB: 1946-02-06, 78 y.o.   MRN: [id number]  HPI: [name] is a 78 y.o. female presenting on 07/16/2023 for Medical Management of Chronic Issues, Anxiety, and Diabetes   HPI Type 2 diabetes mellitus Patient comes in today for recheck of his diabetes. Patient has been currently taking glipizide and Ozempic and Synjardy. Patient is currently on an ACE inhibitor/ARB. Patient has not seen an ophthalmologist this year. Patient denies any new issues with their feet. The symptom started onset as an adult hypertension ARE RELATED TO DM   Hypertension Patient is currently on lisinopril-hydrochlorothiazide and Synjardy, and their blood pressure today is 84/56.  Patient has occasional lightheadedness when she stands up quickly. Patient denies headaches, blurred vision, chest pains, shortness of breath, or weakness. Denies any side effects from medication and is content with current medication.   Depression recheck Current rx-alprazolam 0.5 mg, take 1-2 daily as needed and Lyrica 50 mg twice daily as needed # meds rx-30/month Effectiveness of current meds-works well, also takes Lexapro Adverse reactions form meds-none Pill count performed-No Last drug screen -01/19/2023 ( high risk q65m, moderate risk q61m, low risk yearly ) Urine drug screen today- No Was the [facility] reviewed-yes  If yes were their any concerning findings? -None Controlled substance contract signed on: 01/19/2023,  Relevant past medical, surgical, family and social history reviewed and updated as indicated. Interim medical history since our last visit reviewed. Allergies and medications reviewed and updated.  Review of Systems  Constitutional:  Negative for chills and fever.  Eyes:  Negative for visual disturbance.  Respiratory:  Negative for chest tightness and shortness  of breath.   Cardiovascular:  Negative for chest pain and leg swelling.  Genitourinary:  Negative for difficulty urinating and dysuria.  Musculoskeletal:  Negative for back pain and gait problem.  Skin:  Negative for rash.  Neurological:  Positive for light-headedness and numbness. Negative for dizziness and headaches.  Psychiatric/Behavioral:  Negative for agitation and behavioral problems.   All other systems reviewed and are negative.   Per HPI unless specifically indicated above   Allergies as of 07/16/2023       Reactions   Penicillins Other (See Comments)   Blisters in mouth Did it involve swelling of the face/tongue/throat, SOB, or low BP? No Did it involve sudden or severe rash/hives, skin peeling, or any reaction on the inside of your mouth or nose? No Did you need to seek medical attention at a hospital or doctor's office? No When did it last happen?      Childhood allergy If all above answers are "NO", may proceed with cephalosporin use. Tolerated Cephalosporin Date: 08/04/22.   Codeine Nausea And Vomiting   Erythromycin Nausea And Vomiting   Fenofibrate Swelling   aching & edema    Lyrica [pregabalin] Swelling   Edema in feet   Statins Other (See Comments)   Joints ache   Sulfa Antibiotics Nausea And Vomiting        Medication List        Accurate as of July 16, 2023 11:36 AM. If you have any questions, ask your nurse or doctor.          STOP taking these medications    benzonatate 100 MG  capsule Commonly known as: Lawyer Stopped by: Elige Radon Shenaya Lebo   docusate sodium 100 MG capsule Commonly known as: COLACE Stopped by: Elige Radon Guerino Caporale   lisinopril-hydrochlorothiazide 20-25 MG tablet Commonly known as: ZESTORETIC Replaced by: lisinopril-hydrochlorothiazide 10-12.5 MG tablet Stopped by: Elige Radon Tyshan Enderle   methocarbamol 500 MG tablet Commonly known as: ROBAXIN Stopped by: Elige Radon Zaion Hreha       TAKE these medications     ALPRAZolam 0.5 MG tablet Commonly known as: XANAX Take 1-2 tablets (0.5-1 mg total) by mouth daily as needed for anxiety. Take 1-2 tabs daily as needed   ARIPiprazole 2 MG tablet Commonly known as: ABILIFY Take 1 tablet (2 mg total) by mouth daily.   Aspercreme Lidocaine 4 % Liqd Generic drug: Lidocaine HCl Apply 1 spray topically at bedtime as needed (foot pain).   atorvastatin 10 MG tablet Commonly known as: LIPITOR Take 1 tablet (10 mg total) by mouth every Monday, Wednesday, and Friday.   Australian Tea Tree 100 % Oil Apply 1 application  topically at bedtime as needed (foot pain).   blood glucose meter kit and supplies Kit Dispense based on patient and insurance preference. Use up to two times daily as directed. (Dx: type 2 DM - E11.9)   escitalopram 20 MG tablet Commonly known as: LEXAPRO Take 1 tablet (20 mg total) by mouth daily.   ezetimibe 10 MG tablet Commonly known as: ZETIA Take 1 tablet (10 mg total) by mouth daily.   glipiZIDE 5 MG 24 hr tablet Commonly known as: GLUCOTROL XL Take 1 tablet (5 mg total) by mouth daily with breakfast.   HYDROcodone-acetaminophen 10-325 MG tablet Commonly known as: NORCO Take 1 tablet by mouth every 6 (six) hours as needed for moderate pain (Right knee replaced).   lisinopril-hydrochlorothiazide 10-12.5 MG tablet Commonly known as: ZESTORETIC Take 1 tablet by mouth daily. Replaces: lisinopril-hydrochlorothiazide 20-25 MG tablet Started by: Elige Radon Tara Rud   meclizine 25 MG tablet Commonly known as: ANTIVERT TAKE ONE TABLET THREE TIMES DAILY AS NEEDED FOR DIZZINESS Strength: 25 mg   nicotine polacrilex 2 MG gum Commonly known as: NICORETTE Take 2 mg by mouth as needed for smoking cessation.   ONE TOUCH ULTRA 2 w/Device Kit CHECK BLOOD SUGAR UP TO TWICE DAILY Dx E11.9   OneTouch Delica Lancets 33G Misc CHECK BLOOD SUGAR UP TO TWICE DAILY Dx E11.9   OneTouch Ultra test strip Generic drug: glucose blood Check  BS up to 2 times daily Dx 11.43   Ozempic (1 MG/DOSE) 4 MG/3ML Sopn Generic drug: Semaglutide (1 MG/DOSE) Inject 1 mg into the skin once a week.   pantoprazole 40 MG tablet Commonly known as: PROTONIX Take 1 tablet (40 mg total) by mouth daily.   polyethylene glycol 17 g packet Commonly known as: MIRALAX / GLYCOLAX Take 17 g by mouth 2 (two) times daily as needed (constipation).   pregabalin 50 MG capsule Commonly known as: Lyrica Take 1 capsule (50 mg total) by mouth 2 (two) times daily.   Premarin vaginal cream Generic drug: conjugated estrogens USE VAGINALLY AT BEDTIME AS DIRECTED What changed: See the new instructions.   Synjardy XR 25-1000 MG Tb24 Generic drug: Empagliflozin-metFORMIN HCl ER Take 1 tablet by mouth daily.   Vitamin D 50 MCG (2000 UT) tablet Take 2,000 Units by mouth every morning.         Objective:   BP (!) 84/56   Pulse 95   Ht 5\' 6"  (1.676 m)   Wt 193 lb (  87.5 kg)   SpO2 95%   BMI 31.15 kg/m   Wt Readings from Last 3 Encounters:  07/16/23 193 lb (87.5 kg)  04/22/23 197 lb (89.4 kg)  01/06/23 197 lb (89.4 kg)    Physical Exam Vitals and nursing note reviewed.  Constitutional:      General: She is not in acute distress.    Appearance: She is well-developed. She is not diaphoretic.  Eyes:     Conjunctiva/sclera: Conjunctivae normal.  Cardiovascular:     Rate and Rhythm: Normal rate and regular rhythm.     Heart sounds: Normal heart sounds. No murmur heard. Pulmonary:     Effort: Pulmonary effort is normal. No respiratory distress.     Breath sounds: Normal breath sounds. No wheezing.  Musculoskeletal:        General: No swelling.  Skin:    General: Skin is warm and dry.     Findings: No rash.  Neurological:     Mental Status: She is alert and oriented to person, place, and time.     Coordination: Coordination normal.  Psychiatric:        Behavior: Behavior normal.       Assessment & Plan:   Problem List Items  Addressed This Visit       Cardiovascular and Mediastinum   Essential hypertension, benign - Primary (Chronic)   Relevant Medications   lisinopril-hydrochlorothiazide (ZESTORETIC) 10-12.5 MG tablet   Other Relevant Orders   CMP14+EGFR   Lipid panel     Endocrine   Diabetes (HCC) (Chronic)   Relevant Medications   Empagliflozin-metFORMIN HCl ER (SYNJARDY XR) 25-1000 MG TB24   lisinopril-hydrochlorothiazide (ZESTORETIC) 10-12.5 MG tablet   Other Relevant Orders   Bayer DCA Hb A1c Waived   CBC with Differential/Platelet   CMP14+EGFR   Lipid panel   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HCC)   Relevant Medications   ALPRAZolam (XANAX) 0.5 MG tablet   OneTouch Delica Lancets 33G MISC   pregabalin (LYRICA) 50 MG capsule   Empagliflozin-metFORMIN HCl ER (SYNJARDY XR) 25-1000 MG TB24   lisinopril-hydrochlorothiazide (ZESTORETIC) 10-12.5 MG tablet   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)   Relevant Medications   OneTouch Delica Lancets 33G MISC   pregabalin (LYRICA) 50 MG capsule   Empagliflozin-metFORMIN HCl ER (SYNJARDY XR) 25-1000 MG TB24   lisinopril-hydrochlorothiazide (ZESTORETIC) 10-12.5 MG tablet     Other   Depression   Relevant Medications   ALPRAZolam (XANAX) 0.5 MG tablet   Other Visit Diagnoses       Controlled substance agreement signed       Relevant Medications   ALPRAZolam (XANAX) 0.5 MG tablet       Refill current medicines, blood pressure on the lower side though so we will lower her blood pressure pills.  A1c is 6.7, looks good, no changes. Follow up plan: Return in about 3 months (around 10/15/2023), or if symptoms worsen or fail to improve, for Diabetes and depression.  Counseling provided for all of the vaccine components Orders Placed This Encounter  Procedures   Bayer DCA Hb A1c Waived   CBC with Differential/Platelet   CMP14+EGFR   Lipid panel    Arville Care, MD [facility] 07/16/2023, 11:36 AM

## 2023-07-16 NOTE — Patient Instructions (Addendum)
 Cut current lisinopril in half until you finish it and then the new prescription will be a lower dose blood pressure pill

## 2023-07-17 LAB — CBC WITH DIFFERENTIAL/PLATELET
Basophils Absolute: 0.1 10*3/uL (ref 0.0–0.2)
Basos: 1 %
EOS (ABSOLUTE): 0.1 10*3/uL (ref 0.0–0.4)
Eos: 2 %
Hematocrit: 45.2 % (ref 34.0–46.6)
Hemoglobin: 14.7 g/dL (ref 11.1–15.9)
Immature Grans (Abs): 0 10*3/uL (ref 0.0–0.1)
Immature Granulocytes: 0 %
Lymphocytes Absolute: 1.8 10*3/uL (ref 0.7–3.1)
Lymphs: 26 %
MCH: 28.4 pg (ref 26.6–33.0)
MCHC: 32.5 g/dL (ref 31.5–35.7)
MCV: 87 fL (ref 79–97)
Monocytes Absolute: 0.7 10*3/uL (ref 0.1–0.9)
Monocytes: 10 %
Neutrophils Absolute: 4.2 10*3/uL (ref 1.4–7.0)
Neutrophils: 61 %
Platelets: 218 10*3/uL (ref 150–450)
RBC: 5.17 x10E6/uL (ref 3.77–5.28)
RDW: 14.2 % (ref 11.7–15.4)
WBC: 6.9 10*3/uL (ref 3.4–10.8)

## 2023-07-17 LAB — CMP14+EGFR
ALT: 27 IU/L (ref 0–32)
AST: 22 IU/L (ref 0–40)
Albumin: 4.4 g/dL (ref 3.8–4.8)
Alkaline Phosphatase: 74 IU/L (ref 44–121)
BUN/Creatinine Ratio: 13 (ref 12–28)
BUN: 12 mg/dL (ref 8–27)
Bilirubin Total: 0.4 mg/dL (ref 0.0–1.2)
CO2: 20 mmol/L (ref 20–29)
Calcium: 9.7 mg/dL (ref 8.7–10.3)
Chloride: 95 mmol/L — ABNORMAL LOW (ref 96–106)
Creatinine, Ser: 0.92 mg/dL (ref 0.57–1.00)
Globulin, Total: 2.3 g/dL (ref 1.5–4.5)
Glucose: 179 mg/dL — ABNORMAL HIGH (ref 70–99)
Potassium: 4.4 mmol/L (ref 3.5–5.2)
Sodium: 135 mmol/L (ref 134–144)
Total Protein: 6.7 g/dL (ref 6.0–8.5)
eGFR: 64 mL/min/{1.73_m2} (ref >59)

## 2023-07-17 LAB — LIPID PANEL
Chol/HDL Ratio: 3.4 ratio (ref 0.0–4.4)
Cholesterol, Total: 131 mg/dL (ref 100–199)
HDL: 39 mg/dL — ABNORMAL LOW (ref >39)
LDL Chol Calc (NIH): 59 mg/dL (ref 0–99)
Triglycerides: 198 mg/dL — ABNORMAL HIGH (ref 0–149)
VLDL Cholesterol Cal: 33 mg/dL (ref 5–40)

## 2023-07-20 ENCOUNTER — Encounter: Payer: Self-pay | Admitting: Family Medicine

## 2023-07-20 NOTE — Progress Notes (Signed)
 A1C in goal. Glucose elevated, but improved. Chloride with mild variation, not concerning at this time. Will continue to monitor. Triglycerides slightly improved. HDL "good" cholesterol is low. Recommend increase intake of fresh fruits and vegetables, increase intake of lean proteins. Bake, broil, or grill foods. Avoid fried, greasy, and fatty foods. Avoid fast foods. Increase intake of fiber-rich whole grains. Exercise encouraged - at least 150 minutes per week and advance as tolerated. Continue lipitor and zetia

## 2023-07-26 ENCOUNTER — Ambulatory Visit (INDEPENDENT_AMBULATORY_CARE_PROVIDER_SITE_OTHER): Payer: Medicare Other

## 2023-07-26 ENCOUNTER — Encounter: Payer: Self-pay | Admitting: Family Medicine

## 2023-07-26 VITALS — BP 84/56 | HR 95 | Ht 66.0 in | Wt 193.0 lb

## 2023-07-26 DIAGNOSIS — Z0000 Encounter for general adult medical examination without abnormal findings: Secondary | ICD-10-CM

## 2023-07-26 NOTE — Progress Notes (Signed)
 Subjective:   [name] is a 78 y.o. who presents for a Medicare Wellness preventive visit.  Visit Complete: Virtual I connected with  [name] on 07/26/23 by a audio enabled telemedicine application and verified that I am speaking with the correct person using two identifiers.  Patient Location: Home  Provider Location: Home Office  I discussed the limitations of evaluation and management by telemedicine. The patient expressed understanding and agreed to proceed.  Vital Signs: Because this visit was a virtual/telehealth visit, some criteria may be missing or patient reported. Any vitals not documented were not able to be obtained and vitals that have been documented are patient reported.  VideoDeclined- This patient declined Librarian, academic. Therefore the visit was completed with audio only.  Persons Participating in Visit: Patient.  AWV Questionnaire: No: Patient Medicare AWV questionnaire was not completed prior to this visit.  Cardiac Risk Factors include: advanced age (>41men, >49 women);diabetes mellitus;obesity (BMI >30kg/m2)     Objective:    Today's Vitals   07/26/23 1235  BP: (!) 84/56  Pulse: 95  Weight: 193 lb (87.5 kg)  Height: 5\' 6"  (1.676 m)   Body mass index is 31.15 kg/m.     07/26/2023   12:56 PM 11/12/2022   11:14 AM 08/03/2022    5:30 PM 07/24/2022   11:25 AM 07/22/2022    1:46 PM 10/28/2021   11:58 AM 09/10/2021   12:54 PM  Advanced Directives  Does Patient Have a Medical Advance Directive? No Yes Yes Yes;No Yes Yes Yes  Type of Surveyor, minerals;Living will  Living will  Living will  Does patient want to make changes to medical advance directive?   No - Guardian declined Yes (MAU/Ambulatory/Procedural Areas - Information given) No - Patient declined  No - Patient declined  Copy of Healthcare Power of Attorney in Chart?   Yes - validated most recent copy scanned in chart (See row  information)        Current Medications (verified) Outpatient Encounter Medications as of 07/26/2023  Medication Sig   ALPRAZolam  (XANAX ) 0.5 MG tablet Take 1-2 tablets (0.5-1 mg total) by mouth daily as needed for anxiety. Take 1-2 tabs daily as needed   ARIPiprazole  (ABILIFY ) 2 MG tablet Take 1 tablet (2 mg total) by mouth daily.   atorvastatin  (LIPITOR) 10 MG tablet Take 1 tablet (10 mg total) by mouth every Monday, Wednesday, and Friday.   blood glucose meter kit and supplies KIT Dispense based on patient and insurance preference. Use up to two times daily as directed. (Dx: type 2 DM - E11.9)   Blood Glucose Monitoring Suppl (ONE TOUCH ULTRA 2) w/Device KIT CHECK BLOOD SUGAR UP TO TWICE DAILY Dx E11.9   Cholecalciferol (VITAMIN D ) 50 MCG (2000 UT) tablet Take 2,000 Units by mouth every morning.   conjugated estrogens  (PREMARIN ) vaginal cream USE VAGINALLY AT BEDTIME AS DIRECTED (Patient taking differently: Place 1 applicator vaginally daily as needed (irritation).)   Empagliflozin -metFORMIN  HCl ER (SYNJARDY  XR) 25-1000 MG TB24 Take 1 tablet by mouth daily.   escitalopram  (LEXAPRO ) 20 MG tablet Take 1 tablet (20 mg total) by mouth daily.   ezetimibe  (ZETIA ) 10 MG tablet Take 1 tablet (10 mg total) by mouth daily.   glipiZIDE  (GLUCOTROL  XL) 5 MG 24 hr tablet Take 1 tablet (5 mg total) by mouth daily with breakfast.   glucose blood (ONETOUCH ULTRA) test strip Check BS up to 2 times daily Dx  11.43   HYDROcodone -acetaminophen  (NORCO) 10-325 MG tablet Take 1 tablet by mouth every 6 (six) hours as needed for moderate pain (Right knee replaced).   Lidocaine  HCl (ASPERCREME LIDOCAINE ) 4 % LIQD Apply 1 spray topically at bedtime as needed (foot pain).   lisinopril -hydrochlorothiazide  (ZESTORETIC ) 10-12.5 MG tablet Take 1 tablet by mouth daily.   meclizine  (ANTIVERT ) 25 MG tablet TAKE ONE TABLET THREE TIMES DAILY AS NEEDED FOR DIZZINESS Strength: 25 mg   nicotine  polacrilex (NICORETTE) 2 MG gum Take 2  mg by mouth as needed for smoking cessation.   OneTouch Delica Lancets 33G MISC CHECK BLOOD SUGAR UP TO TWICE DAILY Dx E11.9   pantoprazole  (PROTONIX ) 40 MG tablet Take 1 tablet (40 mg total) by mouth daily.   polyethylene glycol (MIRALAX  / GLYCOLAX ) 17 g packet Take 17 g by mouth 2 (two) times daily as needed (constipation).   pregabalin  (LYRICA ) 50 MG capsule Take 1 capsule (50 mg total) by mouth 2 (two) times daily.   Semaglutide , 1 MG/DOSE, (OZEMPIC , 1 MG/DOSE,) 4 MG/3ML SOPN Inject 1 mg into the skin once a week.   Tea Tree Oil (AUSTRALIAN TEA TREE) 100 % OIL Apply 1 application  topically at bedtime as needed (foot pain).   No facility-administered encounter medications on file as of 07/26/2023.    Allergies (verified) Penicillins, Codeine, Erythromycin, Fenofibrate, Lyrica  [pregabalin ], Statins, and Sulfa  antibiotics   History: Past Medical History:  Diagnosis Date   Allergy    seasonal   Anxiety    Arthritis    [place]    Colon polyps    Complication of anesthesia    per pt, she woke up in the middle of last colon in  2014 and it makes her constipated                   2014.   Depression    Diabetes mellitus, type 2 (HCC) 06/2010   Diabetic neuropathy (HCC)    Diverticulosis    pt unaware   Esophageal stricture    Fibromyalgia    [place]    GERD (gastroesophageal reflux disease)    Hemorrhoids    Hiatal hernia    Hyperlipidemia    Hypertension    Menopause    Neuropathy due to medical condition (HCC)    Numbness    Peripheral vascular insufficiency (HCC)    Retinopathy    Bilateral   TIA (transient ischemic attack)    left clouds in eyes went to [facility] happened after a knee surgery   Tremor    Vitamin D  deficiency    Past Surgical History:  Procedure Laterality Date   ABDOMINAL HYSTERECTOMY     partial and then total   ANKLE SURGERY Right    BACK SURGERY     COLONOSCOPY     ELBOW SURGERY     left   KNEE ARTHROSCOPY Right 09/12/2018   Procedure:  ARTHROSCOPY KNEE; SYNOVECTOMY;  Surgeon: Mary Rei, MD;  Location: [facility];  Service: Orthopedics;  Laterality: Right;    PATELLA REVISION Right 09/10/2021   Procedure: Resection arthroplasty right patella;  Surgeon: Mary Rei, MD;  Location: [facility];  Service: Orthopedics;  Laterality: Right;   PATELLECTOMY Right 08/03/2022   Procedure: RIGHT KNEE PATELLECTOMY;  Surgeon: Mary Rei, MD;  Location: [facility];  Service: Orthopedics;  Laterality: Right;   REFRACTIVE SURGERY  2003   SPINAL CORD STIMULATOR INSERTION N/A 01/01/2021   Procedure: SPINAL CORD STIMULATOR PLACEMENT;  Surgeon: Mort Ards, MD;  Location:  [facility];  Service: Orthopedics;  Laterality: N/A;   TOTAL KNEE ARTHROPLASTY     bilateral   TOTAL KNEE REVISION Right 03/09/2018   Procedure: RIGHT TOTAL KNEE REVISION;  Surgeon: Mary Rei, MD;  Location: [facility];  Service: Orthopedics;  Laterality: Right;  with abductor block   VESICOVAGINAL FISTULA CLOSURE W/ TAH  1981   Family History  Problem Relation Age of Onset   Diabetes Mother    Heart failure Mother    Hypertension Mother    Cirrhosis Father    Cirrhosis Brother    Social History   Socioeconomic History   Marital status: Married    Spouse name: Goble Last   Number of children: 2   Years of education: two years of college   Highest education level: Some college, no degree  Occupational History   Occupation: Retired  Tobacco Use   Smoking status: Former    Current packs/day: 0.00    Average packs/day: 1 pack/day for 34.6 years (34.6 ttl pk-yrs)    Types: Cigarettes    Start date: 11/07/1975    Quit date: 06/24/2010    Years since quitting: 13.0   Smokeless tobacco: Never  Vaping Use   Vaping status: Never Used  Substance and Sexual Activity   Alcohol use: Not Currently   Drug use: No   Sexual activity: Yes    Birth control/protection: Surgical  Other Topics Concern   Not on file  Social History Narrative   Lives at home with her  husband.   Left-handed.   One can of Diet Coke per day.   Social Drivers of Corporate investment banker Strain: Low Risk  (07/26/2023)   Overall Financial Resource Strain (CARDIA)    Difficulty of Paying Living Expenses: Not hard at all  Food Insecurity: No Food Insecurity (07/26/2023)   Hunger Vital Sign    Worried About Running Out of Food in the Last Year: Never true    Ran Out of Food in the Last Year: Never true  Transportation Needs: No Transportation Needs (07/26/2023)   PRAPARE - Administrator, Civil Service (Medical): No    Lack of Transportation (Non-Medical): No  Physical Activity: Insufficiently Active (07/26/2023)   Exercise Vital Sign    Days of Exercise per Week: 4 days    Minutes of Exercise per Session: 30 min  Stress: No Stress Concern Present (07/26/2023)   [employer] of Occupational Health - Occupational Stress Questionnaire    Feeling of Stress : Only a little  Social Connections: Socially Integrated (07/26/2023)   Social Connection and Isolation Panel [NHANES]    Frequency of Communication with Friends and Family: More than three times a week    Frequency of Social Gatherings with Friends and Family: More than three times a week    Attends Religious Services: More than 4 times per year    Active Member of [employer] or Organizations: Yes    Attends Engineer, structural: More than 4 times per year    Marital Status: Married    Tobacco Counseling Counseling given: Yes    Clinical Intake:  Pre-visit preparation completed: Yes  Pain : No/denies pain     BMI - recorded: 31.15 Nutritional Status: BMI > 30  Obese Nutritional Risks: None Diabetes: Yes CBG done?: No  Lab Results  Component Value Date   HGBA1C 6.7 (H) 07/16/2023   HGBA1C 7.3 (H) 04/22/2023   HGBA1C 6.9 (H) 04/09/2023     How often  do you need to have someone help you when you read instructions, pamphlets, or other written materials from your doctor or pharmacy?:  1 - Never  Interpreter Needed?: No  Information entered by :: Alia T/cma   Activities of Daily Living     07/26/2023   12:53 PM 08/03/2022    5:30 PM  In your present state of health, do you have any difficulty performing the following activities:  Hearing? 1 0  Vision? 0 0  Difficulty concentrating or making decisions? 1 0  Walking or climbing stairs? 1 1  Comment due knees   Dressing or bathing? 0 0  Doing errands, shopping? 0 1  Preparing Food and eating ? N   Using the Toilet? N   In the past six months, have you accidently leaked urine? Y   Do you have problems with loss of bowel control? N   Managing your Medications? N   Managing your Finances? N   Housekeeping or managing your Housekeeping? N     Patient Care Team: Dettinger, Lucio Sabin, MD as PCP - General (Family Medicine) Sheryle Donning, MD as PCP - Cardiology (Cardiology) Tobin Forts, MD as Consulting Physician (Gastroenterology) Larance Plater, MD as Consulting Physician (Psychiatry) Adelaide Adjutant, MD as Consulting Physician (Physical Medicine and Rehabilitation) Princella Brooklyn, OD as Consulting Physician (Optometry) Delilah Fend, [facility] (Pharmacist)  Indicate any recent Medical Services you may have received from other than [facility] providers in the past year (date may be approximate).     Assessment:   This is a routine wellness examination for [name].  Hearing/Vision screen Hearing Screening - Comments:: Pt denies hearing dif Vision Screening - Comments:: Pt denies vision dif/pt goes to Dr. Dwain Giovanni in [place]   Goals Addressed   None    Depression Screen     07/26/2023   12:51 PM 07/16/2023   10:59 AM 04/22/2023    2:27 PM 01/06/2023    2:06 PM 10/01/2022    9:08 AM 06/29/2022   11:31 AM 06/26/2022    9:08 AM  PHQ 2/9 Scores  PHQ - 2 Score 1 2 1 2 2 2 2   PHQ- 9 Score 8 6 3 5 5 10 10     Fall Risk     07/26/2023   12:50 PM 07/16/2023   10:59 AM 04/22/2023    2:27 PM 01/06/2023    2:06 PM  10/01/2022    9:08 AM  Fall Risk   Falls in the past year? 0 0 0 1 1  Number falls in past yr: 0 0  0 0  Injury with Fall? 0 0  0 1  Risk for fall due to : No Fall Risks Impaired balance/gait;Orthopedic patient  Impaired balance/gait Impaired balance/gait  Follow up Falls prevention discussed;Falls evaluation completed;Education provided Falls evaluation completed  Falls evaluation completed Falls evaluation completed    MEDICARE RISK AT HOME:  Medicare Risk at Home Any stairs in or around the home?: Yes If so, are there any without handrails?: Yes Home free of loose throw rugs in walkways, pet beds, electrical cords, etc?: Yes Adequate lighting in your home to reduce risk of falls?: Yes Life alert?: No Use of a cane, walker or w/c?: Yes (pt uses a scooter) Grab bars in the bathroom?: Yes Shower chair or bench in shower?: Yes Elevated toilet seat or a handicapped toilet?: Yes  TIMED UP AND GO:  Was the test performed?  No  Cognitive Function: 6CIT completed    04/30/2016  11:23 AM 03/11/2015    8:24 AM  MMSE - Mini Mental State Exam  Orientation to time 5 5  Orientation to Place 5 5  Registration 3 3  Attention/ Calculation 4 5  Recall 3 3  Language- name 2 objects 2 2  Language- repeat 1 1  Language- follow 3 step command 3 3  Language- read & follow direction 1 1  Write a sentence 1 1  Copy design 1 1  Total score 29 30        07/26/2023    1:00 PM 07/22/2022    1:46 PM 05/31/2020   11:25 AM 12/14/2018    1:39 PM  6CIT Screen  What Year? 0 points 0 points 0 points 0 points  What month? 0 points 0 points 0 points 0 points  What time? 0 points 0 points 0 points 0 points  Count back from 20 0 points 0 points 0 points 0 points  Months in reverse 0 points 0 points 0 points 2 points  Repeat phrase 0 points 0 points 0 points 0 points  Total Score 0 points 0 points 0 points 2 points    Immunizations Immunization History  Administered Date(s) Administered   Fluad  Quad(high Dose 65+) 01/24/2020, 02/01/2021, 12/26/2021   Fluad Trivalent(High Dose 65+) 01/06/2023   H1N1 03/26/2008   Influenza, High Dose Seasonal PF 01/29/2016, 12/31/2017   Influenza,inj,Quad PF,6+ Mos 01/30/2013, 01/14/2015, 02/06/2019   Influenza,inj,quad, With Preservative 01/04/2014, 01/27/2017   Influenza-Unspecified 01/27/2010, 02/05/2011, 01/19/2012, 02/04/2017, 02/06/2019   Moderna Sars-Covid-2 Vaccination 09/24/2020   PFIZER(Purple Top)SARS-COV-2 Vaccination 04/26/2019, 05/17/2019   Pneumococcal Conjugate-13 02/28/2013   Pneumococcal Polysaccharide-23 02/05/2011   Tdap 09/03/2016   Zoster Recombinant(Shingrix) 11/26/2018, 01/24/2019, 02/06/2019   Zoster, Live 08/08/2009    Screening Tests Health Maintenance  Topic Date Due   OPHTHALMOLOGY EXAM  09/09/2022   Colonoscopy  11/16/2023   COVID-19 Vaccine (4 - 2024-25 season) 08/11/2023 (Originally 12/06/2022)   INFLUENZA VACCINE  11/05/2023   Lung Cancer Screening  11/20/2023   Diabetic kidney evaluation - Urine ACR  01/06/2024   HEMOGLOBIN A1C  01/15/2024   DEXA SCAN  03/31/2024   FOOT EXAM  04/21/2024   Diabetic kidney evaluation - eGFR measurement  07/15/2024   Medicare Annual Wellness (AWV)  07/25/2024   DTaP/Tdap/Td (2 - Td or Tdap) 09/04/2026   Pneumonia Vaccine 23+ Years old  Completed   Hepatitis C Screening  Completed   Zoster Vaccines- Shingrix  Completed   HPV VACCINES  Aged Out   Meningococcal B Vaccine  Aged Out    Health Maintenance  Health Maintenance Due  Topic Date Due   OPHTHALMOLOGY EXAM  09/09/2022   Colonoscopy  11/16/2023   Health Maintenance Items Addressed: See Nurse Notes  Additional Screening:  Vision Screening: Recommended annual ophthalmology exams for early detection of glaucoma and other disorders of the eye.  Dental Screening: Recommended annual dental exams for proper oral hygiene  Community Resource Referral / Chronic Care Management: CRR required this visit?  No   CCM  required this visit?  No     Plan:     I have personally reviewed and noted the following in the patient's chart:   Medical and social history Use of alcohol, tobacco or illicit drugs  Current medications and supplements including opioid prescriptions. Patient is not currently taking opioid prescriptions. Functional ability and status Nutritional status Physical activity Advanced directives List of other physicians Hospitalizations, surgeries, and ER visits in previous 12 months Vitals Screenings  to include cognitive, depression, and falls Referrals and appointments  In addition, I have reviewed and discussed with patient certain preventive protocols, quality metrics, and best practice recommendations. A written personalized care plan for preventive services as well as general preventive health recommendations were provided to patient.     Michaelle Adolphus, CMA   07/26/2023   After Visit Summary: (MyChart) Due to this being a telephonic visit, the after visit summary with patients personalized plan was offered to patient via MyChart   Notes: Please remember to check on your diabetic eye exam. If need to, make an appointment to have that done as soon a possible.

## 2023-07-26 NOTE — Patient Instructions (Signed)
 [name] , Thank you for taking time to come for your Medicare Wellness Visit. I appreciate your ongoing commitment to your health goals. Please review the following plan we discussed and let me know if I can assist you in the future.   Referrals/Orders/Follow-Ups/Clinician Recommendations: Please remember to check on your diabetic eye exam. If need to, make an appointment to have that done as soon a possible.   This is a list of the screening recommended for you and due dates:  Health Maintenance  Topic Date Due   Eye exam for diabetics  09/09/2022   Colon Cancer Screening  11/16/2023   COVID-19 Vaccine (4 - 2024-25 season) 08/11/2023*   Flu Shot  11/05/2023   Screening for Lung Cancer  11/20/2023   Yearly kidney health urinalysis for diabetes  01/06/2024   Hemoglobin A1C  01/15/2024   DEXA scan (bone density measurement)  03/31/2024   Complete foot exam   04/21/2024   Yearly kidney function blood test for diabetes  07/15/2024   Medicare Annual Wellness Visit  07/25/2024   DTaP/Tdap/Td vaccine (2 - Td or Tdap) 09/04/2026   Pneumonia Vaccine  Completed   Hepatitis C Screening  Completed   Zoster (Shingles) Vaccine  Completed   HPV Vaccine  Aged Out   Meningitis B Vaccine  Aged Out  *Topic was postponed. The date shown is not the original due date.    Advanced directives: (Declined) Advance directive discussed with you today. Even though you declined this today, please call our office should you change your mind, and we can give you the proper paperwork for you to fill out.  Next Medicare Annual Wellness Visit scheduled for next year: Yes

## 2023-07-28 ENCOUNTER — Ambulatory Visit: Payer: Medicare Other | Admitting: Family Medicine

## 2023-07-28 ENCOUNTER — Telehealth: Payer: Self-pay

## 2023-07-28 NOTE — Telephone Encounter (Signed)
 Pt aware to take 1/2 Lisinopril /hydrochlorothiazide . Med list updated.

## 2023-07-28 NOTE — Telephone Encounter (Signed)
-----   Message from Lucio Sabin Dettinger sent at 07/28/2023  8:59 AM EDT ----- Have her continue to take half a tablet of the lisinopril /hydrochlorothiazide  and update it in her chart for me please ----- Message ----- From: Michaelle Adolphus, CMA Sent: 07/26/2023   2:04 PM EDT To: Lucio Sabin Dettinger, MD  During AWV today pt stated that she is supposed to take 1/2 of her lisinopril -HTZ? Chart stated take 1. Please verify and let pt know.  Thank you.

## 2023-08-03 DIAGNOSIS — K080 Exfoliation of teeth due to systemic causes: Secondary | ICD-10-CM | POA: Diagnosis not present

## 2023-09-03 ENCOUNTER — Other Ambulatory Visit: Payer: Self-pay | Admitting: Family Medicine

## 2023-09-03 DIAGNOSIS — E11610 Type 2 diabetes mellitus with diabetic neuropathic arthropathy: Secondary | ICD-10-CM

## 2023-09-03 DIAGNOSIS — E1143 Type 2 diabetes mellitus with diabetic autonomic (poly)neuropathy: Secondary | ICD-10-CM

## 2023-09-03 DIAGNOSIS — F320 Major depressive disorder, single episode, mild: Secondary | ICD-10-CM

## 2023-09-03 DIAGNOSIS — E1142 Type 2 diabetes mellitus with diabetic polyneuropathy: Secondary | ICD-10-CM

## 2023-09-20 DIAGNOSIS — Z96653 Presence of artificial knee joint, bilateral: Secondary | ICD-10-CM | POA: Diagnosis not present

## 2023-09-20 DIAGNOSIS — M25561 Pain in right knee: Secondary | ICD-10-CM | POA: Diagnosis not present

## 2023-09-20 DIAGNOSIS — G8929 Other chronic pain: Secondary | ICD-10-CM | POA: Diagnosis not present

## 2023-10-04 DIAGNOSIS — Z1231 Encounter for screening mammogram for malignant neoplasm of breast: Secondary | ICD-10-CM | POA: Diagnosis not present

## 2023-10-04 LAB — HM MAMMOGRAPHY

## 2023-10-06 ENCOUNTER — Encounter: Payer: Self-pay | Admitting: Family Medicine

## 2023-10-11 ENCOUNTER — Ambulatory Visit: Admitting: Family Medicine

## 2023-10-11 ENCOUNTER — Encounter: Payer: Self-pay | Admitting: Family Medicine

## 2023-10-11 VITALS — BP 113/61 | HR 105 | Ht 66.0 in | Wt 198.0 lb

## 2023-10-11 DIAGNOSIS — E1142 Type 2 diabetes mellitus with diabetic polyneuropathy: Secondary | ICD-10-CM

## 2023-10-11 DIAGNOSIS — I10 Essential (primary) hypertension: Secondary | ICD-10-CM

## 2023-10-11 DIAGNOSIS — E1143 Type 2 diabetes mellitus with diabetic autonomic (poly)neuropathy: Secondary | ICD-10-CM

## 2023-10-11 DIAGNOSIS — E11610 Type 2 diabetes mellitus with diabetic neuropathic arthropathy: Secondary | ICD-10-CM | POA: Diagnosis not present

## 2023-10-11 DIAGNOSIS — Z79899 Other long term (current) drug therapy: Secondary | ICD-10-CM

## 2023-10-11 DIAGNOSIS — M797 Fibromyalgia: Secondary | ICD-10-CM

## 2023-10-11 DIAGNOSIS — F320 Major depressive disorder, single episode, mild: Secondary | ICD-10-CM

## 2023-10-11 DIAGNOSIS — Z7985 Long-term (current) use of injectable non-insulin antidiabetic drugs: Secondary | ICD-10-CM

## 2023-10-11 LAB — BMP8+EGFR
BUN/Creatinine Ratio: 13 (ref 12–28)
BUN: 11 mg/dL (ref 8–27)
CO2: 19 mmol/L — ABNORMAL LOW (ref 20–29)
Calcium: 9.5 mg/dL (ref 8.7–10.3)
Chloride: 97 mmol/L (ref 96–106)
Creatinine, Ser: 0.84 mg/dL (ref 0.57–1.00)
Glucose: 177 mg/dL — ABNORMAL HIGH (ref 70–99)
Potassium: 5 mmol/L (ref 3.5–5.2)
Sodium: 137 mmol/L (ref 134–144)
eGFR: 72 mL/min/1.73 (ref >59)

## 2023-10-11 LAB — BAYER DCA HB A1C WAIVED: HB A1C (BAYER DCA - WAIVED): 7.1 % — ABNORMAL HIGH (ref 4.8–5.6)

## 2023-10-11 MED ORDER — ALPRAZOLAM 0.5 MG PO TABS: 0.5000 mg | ORAL_TABLET | Freq: Every day | ORAL | 2 refills | Status: DC | PRN

## 2023-10-11 MED ORDER — PREGABALIN 50 MG PO CAPS: 50.0000 mg | ORAL_CAPSULE | Freq: Two times a day (BID) | ORAL | 2 refills | Status: DC

## 2023-10-11 NOTE — Progress Notes (Signed)
 BP 113/61   Pulse (!) 105   Ht 5' 6 (1.676 m)   Wt 198 lb (89.8 kg)   SpO2 95%   BMI 31.96 kg/m    Subjective:   Patient ID: [name], female    DOB: [birth year], 78 y.o.   MRN: [id number]  HPI: KENDA KLOEHN is a 78 y.o. female presenting on 10/11/2023 for Medical Management of Chronic Issues, Diabetes, and Hypertension   HPI Type 2 diabetes mellitus Patient comes in today for recheck of his diabetes. Patient has been currently taking glipizide  and Ozempic  and Synjardy . Patient is currently on an ACE inhibitor/ARB. Patient has seen an ophthalmologist this year. Patient denies any new issues with their feet. The symptom started onset as an adult hypertension and fibromyalgia ARE RELATED TO DM   Hypertension Patient is currently on lisinopril -hydrochlorothiazide , and their blood pressure today is 113/61. Patient denies any lightheadedness or dizziness. Patient denies headaches, blurred vision, chest pains, shortness of breath, or weakness. Denies any side effects from medication and is content with current medication.   Fibromyalgia and anxiety depression recheck Current rx-alprazolam  and Abilify  and Lyrica  # meds rx-30 and 30 and 60 Effectiveness of current meds-looks well, she feels like she is doing well. Adverse reactions form meds-none Pill count performed-No Last drug screen -01/19/2023 ( high risk q35m, moderate risk q20m, low risk yearly ) Urine drug screen today- No Was the [facility] reviewed-yes  If yes were their any concerning findings? -None Controlled substance contract signed on: 01/19/2023  Relevant past medical, surgical, family and social history reviewed and updated as indicated. Interim medical history since our last visit reviewed. Allergies and medications reviewed and updated.  Review of Systems  Constitutional:  Negative for chills and fever.  Eyes:  Negative for redness and visual disturbance.  Respiratory:  Negative for chest tightness and shortness of  breath.   Cardiovascular:  Negative for chest pain and leg swelling.  Genitourinary:  Negative for difficulty urinating and dysuria.  Skin:  Negative for rash.  Neurological:  Negative for dizziness, light-headedness and headaches.  Psychiatric/Behavioral:  Negative for agitation, behavioral problems and dysphoric mood. The patient is nervous/anxious.   All other systems reviewed and are negative.   Per HPI unless specifically indicated above   Allergies as of 10/11/2023       Reactions   Penicillins Other (See Comments)   Blisters in mouth Did it involve swelling of the face/tongue/throat, SOB, or low BP? No Did it involve sudden or severe rash/hives, skin peeling, or any reaction on the inside of your mouth or nose? No Did you need to seek medical attention at a hospital or doctor's office? No When did it last happen?      Childhood allergy If all above answers are NO, may proceed with cephalosporin use. Tolerated Cephalosporin Date: 08/04/22.   Codeine Nausea And Vomiting   Erythromycin Nausea And Vomiting   Fenofibrate Swelling   aching & edema    Lyrica  [pregabalin ] Swelling   Edema in feet   Statins Other (See Comments)   Joints ache   Sulfa  Antibiotics Nausea And Vomiting        Medication List        Accurate as of October 11, 2023 10:24 AM. If you have any questions, ask your nurse or doctor.          ALPRAZolam  0.5 MG tablet Commonly known as: XANAX  Take 1-2 tablets (0.5-1 mg total) by mouth daily as needed for anxiety.  Take 1-2 tabs daily as needed   ARIPiprazole  2 MG tablet Commonly known as: ABILIFY  TAKE 1 TABLET BY MOUTH EVERY DAY   Aspercreme Lidocaine  4 % Liqd Generic drug: Lidocaine  HCl Apply 1 spray topically at bedtime as needed (foot pain).   atorvastatin  10 MG tablet Commonly known as: LIPITOR Take 1 tablet (10 mg total) by mouth every Monday, Wednesday, and Friday.   Australian Tea Tree 100 % Oil Apply 1 application  topically at  bedtime as needed (foot pain).   blood glucose meter kit and supplies Kit Dispense based on patient and insurance preference. Use up to two times daily as directed. (Dx: type 2 DM - E11.9)   escitalopram  20 MG tablet Commonly known as: LEXAPRO  Take 1 tablet (20 mg total) by mouth daily.   ezetimibe  10 MG tablet Commonly known as: ZETIA  TAKE 1 TABLET BY MOUTH EVERY DAY   glipiZIDE  5 MG 24 hr tablet Commonly known as: GLUCOTROL  XL TAKE 1 TABLET BY MOUTH EVERY DAY WITH BREAKFAST   HYDROcodone -acetaminophen  10-325 MG tablet Commonly known as: NORCO Take 1 tablet by mouth every 6 (six) hours as needed for moderate pain (Right knee replaced).   lisinopril -hydrochlorothiazide  10-12.5 MG tablet Commonly known as: ZESTORETIC  Take 0.5 tablets by mouth daily.   meclizine  25 MG tablet Commonly known as: ANTIVERT  TAKE ONE TABLET THREE TIMES DAILY AS NEEDED FOR DIZZINESS Strength: 25 mg   nicotine  polacrilex 2 MG gum Commonly known as: NICORETTE Take 2 mg by mouth as needed for smoking cessation.   ONE TOUCH ULTRA 2 w/Device Kit CHECK BLOOD SUGAR UP TO TWICE DAILY Dx E11.9   OneTouch Delica Lancets 33G Misc CHECK BLOOD SUGAR UP TO TWICE DAILY Dx E11.9   OneTouch Ultra test strip Generic drug: glucose blood Check BS up to 2 times daily Dx 11.43   Ozempic  (1 MG/DOSE) 4 MG/3ML Sopn Generic drug: Semaglutide  (1 MG/DOSE) Inject 1 mg into the skin once a week.   pantoprazole  40 MG tablet Commonly known as: PROTONIX  TAKE 1 TABLET BY MOUTH EVERY DAY   polyethylene glycol 17 g packet Commonly known as: MIRALAX  / GLYCOLAX  Take 17 g by mouth 2 (two) times daily as needed (constipation).   pregabalin  50 MG capsule Commonly known as: Lyrica  Take 1 capsule (50 mg total) by mouth 2 (two) times daily.   Premarin  vaginal cream Generic drug: conjugated estrogens  USE VAGINALLY AT BEDTIME AS DIRECTED What changed: See the new instructions.   Synjardy  XR 25-1000 MG Tb24 Generic drug:  Empagliflozin -metFORMIN  HCl ER Take 1 tablet by mouth daily.   Vitamin D  50 MCG (2000 UT) tablet Take 2,000 Units by mouth every morning.         Objective:   BP 113/61   Pulse (!) 105   Ht 5' 6 (1.676 m)   Wt 198 lb (89.8 kg)   SpO2 95%   BMI 31.96 kg/m   Wt Readings from Last 3 Encounters:  10/11/23 198 lb (89.8 kg)  07/26/23 193 lb (87.5 kg)  07/16/23 193 lb (87.5 kg)    Physical Exam Vitals and nursing note reviewed.  Constitutional:      General: She is not in acute distress.    Appearance: She is well-developed. She is not diaphoretic.  Eyes:     Conjunctiva/sclera: Conjunctivae normal.     Pupils: Pupils are equal, round, and reactive to light.  Cardiovascular:     Rate and Rhythm: Normal rate and regular rhythm.     Heart sounds:  Normal heart sounds. No murmur heard. Pulmonary:     Effort: Pulmonary effort is normal. No respiratory distress.     Breath sounds: Normal breath sounds. No wheezing.  Skin:    General: Skin is warm and dry.     Findings: No rash.  Neurological:     Mental Status: She is alert and oriented to person, place, and time.     Coordination: Coordination normal.  Psychiatric:        Behavior: Behavior normal.     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  Blood pressure and everything else looks good today.  No changes Follow up plan: Return in about 3 months (around 01/11/2024), or if symptoms worsen or fail to improve, for Diabetes recheck.  Counseling provided for all of the vaccine components Orders Placed This Encounter  Procedures   Bayer [facility] Hb A1c Waived   BMP8+EGFR    Fonda Levins, MD [facility] 10/11/2023, 10:24 AM

## 2023-10-13 ENCOUNTER — Ambulatory Visit: Payer: Self-pay | Admitting: Family Medicine

## 2023-10-13 DIAGNOSIS — K080 Exfoliation of teeth due to systemic causes: Secondary | ICD-10-CM | POA: Diagnosis not present

## 2023-10-16 ENCOUNTER — Other Ambulatory Visit: Payer: Self-pay | Admitting: Family Medicine

## 2023-10-16 DIAGNOSIS — E1142 Type 2 diabetes mellitus with diabetic polyneuropathy: Secondary | ICD-10-CM

## 2023-10-16 DIAGNOSIS — E11610 Type 2 diabetes mellitus with diabetic neuropathic arthropathy: Secondary | ICD-10-CM

## 2023-10-16 DIAGNOSIS — E1143 Type 2 diabetes mellitus with diabetic autonomic (poly)neuropathy: Secondary | ICD-10-CM

## 2023-10-20 ENCOUNTER — Ambulatory Visit: Admitting: Family Medicine

## 2023-10-31 ENCOUNTER — Other Ambulatory Visit: Payer: Self-pay | Admitting: Family Medicine

## 2023-10-31 DIAGNOSIS — E1143 Type 2 diabetes mellitus with diabetic autonomic (poly)neuropathy: Secondary | ICD-10-CM

## 2023-11-02 ENCOUNTER — Other Ambulatory Visit: Payer: Self-pay | Admitting: Family Medicine

## 2023-11-02 DIAGNOSIS — E1143 Type 2 diabetes mellitus with diabetic autonomic (poly)neuropathy: Secondary | ICD-10-CM

## 2023-11-02 DIAGNOSIS — E1142 Type 2 diabetes mellitus with diabetic polyneuropathy: Secondary | ICD-10-CM

## 2023-11-02 DIAGNOSIS — E11610 Type 2 diabetes mellitus with diabetic neuropathic arthropathy: Secondary | ICD-10-CM

## 2023-11-10 ENCOUNTER — Telehealth: Payer: Self-pay

## 2023-11-10 NOTE — Telephone Encounter (Signed)
 Detailed message left for patient that she can add tart cherry juice.

## 2023-11-10 NOTE — Telephone Encounter (Signed)
 Yes he should be fine to take tart cherry juice

## 2023-11-10 NOTE — Telephone Encounter (Signed)
 Copied from CRM #[id number]. Topic: Clinical - Medication Question >> Nov 10, 2023 12:53 PM Sophia H wrote: Reason for CRM: Tart Cherry Juice - patient is wanting to know if that is okay to add to her daily routine given the medications she is on. Please advise # [phone]

## 2023-11-17 ENCOUNTER — Other Ambulatory Visit: Payer: Self-pay | Admitting: Family Medicine

## 2023-11-17 DIAGNOSIS — E1142 Type 2 diabetes mellitus with diabetic polyneuropathy: Secondary | ICD-10-CM

## 2023-11-17 DIAGNOSIS — E1143 Type 2 diabetes mellitus with diabetic autonomic (poly)neuropathy: Secondary | ICD-10-CM

## 2023-11-17 DIAGNOSIS — F320 Major depressive disorder, single episode, mild: Secondary | ICD-10-CM

## 2023-11-17 DIAGNOSIS — E11610 Type 2 diabetes mellitus with diabetic neuropathic arthropathy: Secondary | ICD-10-CM

## 2023-11-18 ENCOUNTER — Other Ambulatory Visit: Payer: Self-pay | Admitting: Family Medicine

## 2023-11-18 DIAGNOSIS — E11610 Type 2 diabetes mellitus with diabetic neuropathic arthropathy: Secondary | ICD-10-CM

## 2023-11-18 DIAGNOSIS — E1143 Type 2 diabetes mellitus with diabetic autonomic (poly)neuropathy: Secondary | ICD-10-CM

## 2023-11-18 DIAGNOSIS — E1142 Type 2 diabetes mellitus with diabetic polyneuropathy: Secondary | ICD-10-CM

## 2023-11-26 DIAGNOSIS — M25561 Pain in right knee: Secondary | ICD-10-CM | POA: Diagnosis not present

## 2023-11-26 DIAGNOSIS — Z96653 Presence of artificial knee joint, bilateral: Secondary | ICD-10-CM | POA: Diagnosis not present

## 2023-11-30 ENCOUNTER — Other Ambulatory Visit: Payer: Self-pay | Admitting: Family Medicine

## 2023-11-30 DIAGNOSIS — E1142 Type 2 diabetes mellitus with diabetic polyneuropathy: Secondary | ICD-10-CM

## 2023-12-30 ENCOUNTER — Other Ambulatory Visit: Payer: Self-pay | Admitting: Family Medicine

## 2023-12-30 DIAGNOSIS — E1143 Type 2 diabetes mellitus with diabetic autonomic (poly)neuropathy: Secondary | ICD-10-CM

## 2023-12-30 DIAGNOSIS — F320 Major depressive disorder, single episode, mild: Secondary | ICD-10-CM

## 2023-12-30 DIAGNOSIS — E1142 Type 2 diabetes mellitus with diabetic polyneuropathy: Secondary | ICD-10-CM

## 2023-12-30 DIAGNOSIS — E11610 Type 2 diabetes mellitus with diabetic neuropathic arthropathy: Secondary | ICD-10-CM

## 2024-01-12 ENCOUNTER — Encounter: Payer: Self-pay | Admitting: Family Medicine

## 2024-01-12 ENCOUNTER — Ambulatory Visit: Admitting: Family Medicine

## 2024-01-12 ENCOUNTER — Other Ambulatory Visit: Payer: Self-pay | Admitting: Family Medicine

## 2024-01-12 VITALS — BP 91/65 | HR 96 | Ht 66.0 in | Wt 199.0 lb

## 2024-01-12 DIAGNOSIS — I700 Atherosclerosis of aorta: Secondary | ICD-10-CM

## 2024-01-12 DIAGNOSIS — Z87891 Personal history of nicotine dependence: Secondary | ICD-10-CM

## 2024-01-12 DIAGNOSIS — I10 Essential (primary) hypertension: Secondary | ICD-10-CM | POA: Diagnosis not present

## 2024-01-12 DIAGNOSIS — E1143 Type 2 diabetes mellitus with diabetic autonomic (poly)neuropathy: Secondary | ICD-10-CM

## 2024-01-12 DIAGNOSIS — Z7984 Long term (current) use of oral hypoglycemic drugs: Secondary | ICD-10-CM

## 2024-01-12 DIAGNOSIS — Z79899 Other long term (current) drug therapy: Secondary | ICD-10-CM

## 2024-01-12 DIAGNOSIS — E1142 Type 2 diabetes mellitus with diabetic polyneuropathy: Secondary | ICD-10-CM | POA: Diagnosis not present

## 2024-01-12 DIAGNOSIS — F320 Major depressive disorder, single episode, mild: Secondary | ICD-10-CM

## 2024-01-12 DIAGNOSIS — Z23 Encounter for immunization: Secondary | ICD-10-CM

## 2024-01-12 DIAGNOSIS — E11610 Type 2 diabetes mellitus with diabetic neuropathic arthropathy: Secondary | ICD-10-CM

## 2024-01-12 LAB — LIPID PANEL

## 2024-01-12 LAB — BAYER DCA HB A1C WAIVED: HB A1C (BAYER DCA - WAIVED): 7.6 % — ABNORMAL HIGH (ref 4.8–5.6)

## 2024-01-12 MED ORDER — ALPRAZOLAM 0.5 MG PO TABS: 0.5000 mg | ORAL_TABLET | Freq: Every day | ORAL | 2 refills | Status: DC | PRN

## 2024-01-12 MED ORDER — ARIPIPRAZOLE 5 MG PO TABS: 5.0000 mg | ORAL_TABLET | Freq: Every day | ORAL | 3 refills | Status: AC

## 2024-01-12 NOTE — Progress Notes (Signed)
 BP 91/65   Pulse 96   Ht 5' 6 (1.676 m)   Wt 199 lb (90.3 kg)   SpO2 97%   BMI 32.12 kg/m    Subjective:   [name], female    DOB: [birth year], 78 y.o.   MRN: [id number]  HPI: [name] is a 78 y.o. female presenting on 01/12/2024 for Medical Management of Chronic Issues, Diabetes, Fatigue, and heart flutters   Discussed the use of AI scribe software for clinical note transcription with the patient, who gave verbal consent to proceed.  History of Present Illness   [name] is a 78 year old female with type 2 diabetes, hypertension, anxiety, and depression who presents for a recheck of her chronic medical issues.  Sleep disturbance - Difficulty maintaining sleep, with frequent awakenings during the night without a clear reason - Occasionally wakes up to urinate, but this is not consistent - No daytime napping - Feels tired due to lack of sleep - Takes Abilify  at night, approximately 30 minutes before bedtime - No waking up due to low blood sugar  Mood and anxiety symptoms - Anxiety and depression have been slightly worse, attributed to inability to perform desired activities due to knee and back issues - Significant anxiety related to physical limitations - Currently taking Abilify , Lexapro , and alprazolam  0.5 mg, one to two tablets daily as needed (30 tablets per month, two refills) - Urine drug screening contract due for renewal  Peripheral neuropathy - Neuropathy symptoms, particularly when blood glucose is elevated - Significant foot pain associated with neuropathy - Lyrica  provides symptomatic relief  Glycemic control - Type 2 diabetes managed with Ozempic , Synjardy , and glipizide  - Forgets to check blood sugar regularly - Believes blood glucose is generally well-controlled  Orthostatic symptoms - Occasional lightheadedness when standing up, but not frequent - Drinks fluids throughout the day  Hypertension and aortic atherosclerosis -  Hypertension and aortic atherosclerosis - Currently reducing one blood pressure medication by half  Hyperlipidemia - Takes Lipitor and Zetia  for cholesterol management          Relevant past medical, surgical, family and social history reviewed and updated as indicated. Interim medical history since our last visit reviewed. Allergies and medications reviewed and updated.  Review of Systems  Constitutional:  Negative for chills and fever.  Eyes:  Negative for visual disturbance.  Respiratory:  Negative for chest tightness and shortness of breath.   Cardiovascular:  Positive for palpitations. Negative for chest pain and leg swelling.  Genitourinary:  Negative for difficulty urinating and dysuria.  Musculoskeletal:  Negative for back pain and gait problem.  Skin:  Negative for rash.  Neurological:  Negative for light-headedness and headaches.  Psychiatric/Behavioral:  Positive for dysphoric mood and sleep disturbance. Negative for agitation and behavioral problems. The patient is nervous/anxious.   All other systems reviewed and are negative.   Per HPI unless specifically indicated above   Allergies as of 01/12/2024       Reactions   Penicillins Other (See Comments)   Blisters in mouth Did it involve swelling of the face/tongue/throat, SOB, or low BP? No Did it involve sudden or severe rash/hives, skin peeling, or any reaction on the inside of your mouth or nose? No Did you need to seek medical attention at a hospital or doctor's office? No When did it last happen?      Childhood allergy If all above answers are NO, may proceed with cephalosporin use. Tolerated Cephalosporin  Date: 08/04/22.   Codeine Nausea And Vomiting   Erythromycin Nausea And Vomiting   Fenofibrate Swelling   aching & edema    Lyrica  [pregabalin ] Swelling   Edema in feet   Statins Other (See Comments)   Joints ache   Sulfa  Antibiotics Nausea And Vomiting        Medication List        Accurate  as of January 12, 2024  1:24 PM. If you have any questions, ask your nurse or doctor.          ALPRAZolam  0.5 MG tablet Commonly known as: XANAX  Take 1-2 tablets (0.5-1 mg total) by mouth daily as needed for anxiety. Take 1-2 tabs daily as needed   ARIPiprazole  5 MG tablet Commonly known as: ABILIFY  Take 1 tablet (5 mg total) by mouth daily. What changed:  medication strength how much to take Changed by: Fonda LABOR Mathayus Stanbery   Aspercreme Lidocaine  4 % Liqd Generic drug: Lidocaine  HCl Apply 1 spray topically at bedtime as needed (foot pain).   atorvastatin  10 MG tablet Commonly known as: LIPITOR TAKE 1 TABLET (10 MG TOTAL) BY MOUTH EVERY MONDAY, WEDNESDAY, AND FRIDAY.   Australian Tea Tree 100 % Oil Apply 1 application  topically at bedtime as needed (foot pain).   blood glucose meter kit and supplies Kit Dispense based on patient and insurance preference. Use up to two times daily as directed. (Dx: type 2 DM - E11.9)   escitalopram  20 MG tablet Commonly known as: LEXAPRO  Take 1 tablet (20 mg total) by mouth daily.   ezetimibe  10 MG tablet Commonly known as: ZETIA  TAKE 1 TABLET BY MOUTH EVERY DAY   glipiZIDE  5 MG 24 hr tablet Commonly known as: GLUCOTROL  XL TAKE 1 TABLET BY MOUTH EVERY DAY WITH BREAKFAST   HYDROcodone -acetaminophen  10-325 MG tablet Commonly known as: NORCO Take 1 tablet by mouth every 6 (six) hours as needed for moderate pain (Right knee replaced).   lisinopril -hydrochlorothiazide  10-12.5 MG tablet Commonly known as: ZESTORETIC  Take 0.5 tablets by mouth daily.   meclizine  25 MG tablet Commonly known as: ANTIVERT  TAKE ONE TABLET THREE TIMES DAILY AS NEEDED FOR DIZZINESS Strength: 25 mg   nicotine  polacrilex 2 MG gum Commonly known as: NICORETTE Take 2 mg by mouth as needed for smoking cessation.   ONE TOUCH ULTRA 2 w/Device Kit CHECK BLOOD SUGAR UP TO TWICE DAILY Dx E11.9   OneTouch Delica Lancets 33G Misc CHECK BLOOD SUGAR UP TO TWICE DAILY  Dx E11.9   OneTouch Ultra test strip Generic drug: glucose blood Check BS up to 2 times daily Dx 11.43   Ozempic  (1 MG/DOSE) 4 MG/3ML Sopn Generic drug: Semaglutide  (1 MG/DOSE) INJECT 1 MG INTO THE SKIN ONCE A WEEK   pantoprazole  40 MG tablet Commonly known as: PROTONIX  TAKE 1 TABLET BY MOUTH EVERY DAY   polyethylene glycol 17 g packet Commonly known as: MIRALAX  / GLYCOLAX  Take 17 g by mouth 2 (two) times daily as needed (constipation).   pregabalin  50 MG capsule Commonly known as: Lyrica  Take 1 capsule (50 mg total) by mouth 2 (two) times daily.   Premarin  vaginal cream Generic drug: conjugated estrogens  USE VAGINALLY AT BEDTIME AS DIRECTED What changed: See the new instructions.   Synjardy  XR 25-1000 MG Tb24 Generic drug: Empagliflozin -metFORMIN  HCl ER Take 1 tablet by mouth daily.   Vitamin D  50 MCG (2000 UT) tablet Take 2,000 Units by mouth every morning.         Objective:   BP  91/65   Pulse 96   Ht 5' 6 (1.676 m)   Wt 199 lb (90.3 kg)   SpO2 97%   BMI 32.12 kg/m   Wt Readings from Last 3 Encounters:  01/12/24 199 lb (90.3 kg)  10/11/23 198 lb (89.8 kg)  07/26/23 193 lb (87.5 kg)    Physical Exam Physical Exam   VITALS: BP- 91/65 NECK: Thyroid  normal. No cervical lymphadenopathy. CHEST: Lungs clear to auscultation. CARDIOVASCULAR: Regular heart rate and rhythm, no murmurs. EXTREMITIES: No edema in extremities.         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)   Relevant Orders   Microalbumin/Creatinine Ratio, Urine     Other   Depression   Relevant Medications   ALPRAZolam  (XANAX ) 0.5 MG tablet   ARIPiprazole  (ABILIFY ) 5 MG tablet   Other Visit Diagnoses       Controlled substance agreement signed       Relevant Medications   ALPRAZolam  (XANAX ) 0.5 MG tablet   Other Relevant Orders   ToxASSURE Select 13 (MW), Urine     Stopped smoking with greater than 30 pack year history       Relevant Orders   Ambulatory Referral Lung Cancer Screening [facility]          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 Neuropathy symptoms exacerbated by hyperglycemia. Lyrica  effective for symptom management. - Continue Lyrica . - Continue Ozempic , Synjardy , and glipizide .  Depression and anxiety Chronic depression and anxiety with recent exacerbation. Abilify  dose increase to manage symptoms and improve sleep. - Increase Abilify  to 5 mg. - Continue Lexapro  and alprazolam . - Renew controlled substance agreement. - Perform urine drug screen.  Insomnia Difficulty staying asleep, possibly related to anxiety. Abilify  dose adjustment may improve sleep. - Increase Abilify  to 5 mg.  Essential hypertension Blood pressure low at 91/65 mmHg with occasional lightheadedness. Reducing antihypertensive medication. - Monitor blood pressure. - Consider further reduction of antihypertensive medication if hypotension persists.  Aortic atherosclerosis Chronic condition managed with Lipitor and Zetia . - Continue Lipitor and Zetia .          Follow up plan: Return in about 3 months (around 04/13/2024), or if symptoms worsen or fail to improve, for Depression and anxiety diabetes..  Counseling provided for all of the vaccine  components Orders Placed This Encounter  Procedures   Bayer DCA Hb A1c Waived   CBC with Differential/Platelet   CMP14+EGFR   Lipid panel   ToxASSURE Select 13 (MW), Urine   Microalbumin/Creatinine Ratio, Urine   Ambulatory Referral Lung Cancer Screening [facility]    Fonda Levins, MD [facility] 01/12/2024, 1:24 PM

## 2024-01-13 LAB — CMP14+EGFR
ALT: 39 IU/L — AB (ref 0–32)
AST: 61 IU/L — AB (ref 0–40)
Albumin: 4.1 g/dL (ref 3.8–4.8)
Alkaline Phosphatase: 84 IU/L (ref 49–135)
BUN/Creatinine Ratio: 14 (ref 12–28)
BUN: 14 mg/dL (ref 8–27)
Bilirubin Total: 0.4 mg/dL (ref 0.0–1.2)
CO2: 21 mmol/L (ref 20–29)
Calcium: 9.5 mg/dL (ref 8.7–10.3)
Chloride: 95 mmol/L — AB (ref 96–106)
Creatinine, Ser: 0.97 mg/dL (ref 0.57–1.00)
Globulin, Total: 2.6 g/dL (ref 1.5–4.5)
Glucose: 181 mg/dL — AB (ref 70–99)
Potassium: 4.5 mmol/L (ref 3.5–5.2)
Sodium: 136 mmol/L (ref 134–144)
Total Protein: 6.7 g/dL (ref 6.0–8.5)
eGFR: 60 mL/min/1.73 (ref >59)

## 2024-01-13 LAB — CBC WITH DIFFERENTIAL/PLATELET
Basophils Absolute: 0.1 x10E3/uL (ref 0.0–0.2)
Basos: 1 %
EOS (ABSOLUTE): 0.1 x10E3/uL (ref 0.0–0.4)
Eos: 2 %
Hematocrit: 46.4 % (ref 34.0–46.6)
Hemoglobin: 14.9 g/dL (ref 11.1–15.9)
Immature Grans (Abs): 0 x10E3/uL (ref 0.0–0.1)
Immature Granulocytes: 0 %
Lymphocytes Absolute: 2.6 x10E3/uL (ref 0.7–3.1)
Lymphs: 33 %
MCH: 28.4 pg (ref 26.6–33.0)
MCHC: 32.1 g/dL (ref 31.5–35.7)
MCV: 88 fL (ref 79–97)
Monocytes Absolute: 0.7 x10E3/uL (ref 0.1–0.9)
Monocytes: 9 %
Neutrophils Absolute: 4.4 x10E3/uL (ref 1.4–7.0)
Neutrophils: 55 %
Platelets: 216 x10E3/uL (ref 150–450)
RBC: 5.25 x10E6/uL (ref 3.77–5.28)
RDW: 14.8 % (ref 11.7–15.4)
WBC: 7.9 x10E3/uL (ref 3.4–10.8)

## 2024-01-13 LAB — MICROALBUMIN / CREATININE URINE RATIO
Creatinine, Urine: 68.8 mg/dL
Microalb/Creat Ratio: <4 mg/g{creat} (ref 0–29)
Microalbumin, Urine: <3 ug/mL

## 2024-01-13 LAB — LIPID PANEL
Cholesterol, Total: 170 mg/dL (ref 100–199)
HDL: 42 mg/dL (ref >39)
LDL CALC COMMENT:: 4 ratio (ref 0.0–4.4)
LDL Chol Calc (NIH): 76 mg/dL (ref 0–99)
Triglycerides: 323 mg/dL — AB (ref 0–149)
VLDL Cholesterol Cal: 52 mg/dL — AB (ref 5–40)

## 2024-01-17 DIAGNOSIS — Z79899 Other long term (current) drug therapy: Secondary | ICD-10-CM | POA: Diagnosis not present

## 2024-01-17 DIAGNOSIS — Z96653 Presence of artificial knee joint, bilateral: Secondary | ICD-10-CM | POA: Diagnosis not present

## 2024-01-17 DIAGNOSIS — M48062 Spinal stenosis, lumbar region with neurogenic claudication: Secondary | ICD-10-CM | POA: Diagnosis not present

## 2024-01-17 DIAGNOSIS — Z5181 Encounter for therapeutic drug level monitoring: Secondary | ICD-10-CM | POA: Diagnosis not present

## 2024-01-18 LAB — TOXASSURE SELECT 13 (MW), URINE

## 2024-01-19 ENCOUNTER — Ambulatory Visit: Payer: Self-pay | Admitting: Family Medicine

## 2024-01-19 DIAGNOSIS — R7989 Other specified abnormal findings of blood chemistry: Secondary | ICD-10-CM

## 2024-02-02 ENCOUNTER — Other Ambulatory Visit

## 2024-02-02 DIAGNOSIS — R7989 Other specified abnormal findings of blood chemistry: Secondary | ICD-10-CM

## 2024-02-03 LAB — CMP14+EGFR
ALT: 28 IU/L (ref 0–32)
AST: 30 IU/L (ref 0–40)
Albumin: 4.4 g/dL (ref 3.8–4.8)
Alkaline Phosphatase: 91 IU/L (ref 49–135)
BUN/Creatinine Ratio: 12 (ref 12–28)
BUN: 12 mg/dL (ref 8–27)
Bilirubin Total: 0.4 mg/dL (ref 0.0–1.2)
CO2: 22 mmol/L (ref 20–29)
Calcium: 10 mg/dL (ref 8.7–10.3)
Chloride: 97 mmol/L (ref 96–106)
Creatinine, Ser: 0.97 mg/dL (ref 0.57–1.00)
Globulin, Total: 2.5 g/dL (ref 1.5–4.5)
Glucose: 145 mg/dL — AB (ref 70–99)
Potassium: 5.2 mmol/L (ref 3.5–5.2)
Sodium: 138 mmol/L (ref 134–144)
Total Protein: 6.9 g/dL (ref 6.0–8.5)
eGFR: 60 mL/min/1.73 (ref >59)

## 2024-02-09 ENCOUNTER — Ambulatory Visit: Payer: Self-pay | Admitting: Family Medicine

## 2024-02-09 DIAGNOSIS — K080 Exfoliation of teeth due to systemic causes: Secondary | ICD-10-CM | POA: Diagnosis not present

## 2024-03-01 NOTE — Progress Notes (Signed)
 [name]                                          MRN: [id number]   03/01/2024   The [facility] Quality Team Specialist reviewed this patient medical record for the purposes of chart review for care gap closure. The following were reviewed: abstraction for care gap closure-kidney health evaluation for diabetes:eGFR  and uACR.    [facility]I Quality Team

## 2024-03-03 ENCOUNTER — Other Ambulatory Visit: Payer: Self-pay | Admitting: Family Medicine

## 2024-03-14 DIAGNOSIS — K080 Exfoliation of teeth due to systemic causes: Secondary | ICD-10-CM | POA: Diagnosis not present

## 2024-04-13 ENCOUNTER — Ambulatory Visit: Payer: Self-pay | Admitting: Family Medicine

## 2024-04-13 ENCOUNTER — Encounter: Payer: Self-pay | Admitting: Family Medicine

## 2024-04-13 VITALS — BP 138/84 | HR 80 | Ht 66.0 in | Wt 202.0 lb

## 2024-04-13 DIAGNOSIS — Z7984 Long term (current) use of oral hypoglycemic drugs: Secondary | ICD-10-CM

## 2024-04-13 DIAGNOSIS — E11610 Type 2 diabetes mellitus with diabetic neuropathic arthropathy: Secondary | ICD-10-CM | POA: Diagnosis not present

## 2024-04-13 DIAGNOSIS — E1143 Type 2 diabetes mellitus with diabetic autonomic (poly)neuropathy: Secondary | ICD-10-CM | POA: Diagnosis not present

## 2024-04-13 DIAGNOSIS — F320 Major depressive disorder, single episode, mild: Secondary | ICD-10-CM | POA: Diagnosis not present

## 2024-04-13 DIAGNOSIS — Z79899 Other long term (current) drug therapy: Secondary | ICD-10-CM

## 2024-04-13 DIAGNOSIS — E1142 Type 2 diabetes mellitus with diabetic polyneuropathy: Secondary | ICD-10-CM | POA: Diagnosis not present

## 2024-04-13 DIAGNOSIS — Z7985 Long-term (current) use of injectable non-insulin antidiabetic drugs: Secondary | ICD-10-CM | POA: Diagnosis not present

## 2024-04-13 LAB — BAYER DCA HB A1C WAIVED: HB A1C (BAYER DCA - WAIVED): 8.5 % — ABNORMAL HIGH (ref 4.8–5.6)

## 2024-04-13 MED ORDER — SYNJARDY XR 25-1000 MG PO TB24: 1.0000 | ORAL_TABLET | Freq: Every day | ORAL | 3 refills | Status: AC

## 2024-04-13 MED ORDER — SEMAGLUTIDE (2 MG/DOSE) 8 MG/3ML ~~LOC~~ SOPN: 2.0000 mg | PEN_INJECTOR | SUBCUTANEOUS | 3 refills | Status: AC

## 2024-04-13 MED ORDER — ALPRAZOLAM 0.5 MG PO TABS: 0.5000 mg | ORAL_TABLET | Freq: Every day | ORAL | 2 refills | Status: AC | PRN

## 2024-04-13 MED ORDER — PREGABALIN 50 MG PO CAPS: 50.0000 mg | ORAL_CAPSULE | Freq: Two times a day (BID) | ORAL | 2 refills | Status: AC

## 2024-04-13 NOTE — Progress Notes (Signed)
 "  BP 138/84   Pulse 80   Ht 5' 6 (1.676 m)   Wt 202 lb (91.6 kg)   SpO2 95%   BMI 32.60 kg/m    Subjective:   Patient ID: [name], female    DOB: 1945-07-30, 79 y.o.   MRN: [id number]  HPI: [name] is a 79 y.o. female presenting on 04/13/2024 for Medical Management of Chronic Issues, Diabetes, and Hypertension   Discussed the use of AI scribe software for clinical note transcription with the patient, who gave verbal consent to proceed.  History of Present Illness   [name] is a 79 year old female with type 2 diabetes who presents for a recheck of her blood sugar levels.  Hyperglycemia and glycemic control - A1c is 8.5, elevated compared to previous levels - Not checking blood glucose as frequently as recommended - Interested in using a continuous glucose monitor [employer]), but concerned about insurance coverage due to not being on insulin  - Has access to a Libre device from a friend's father and plans to use it - Consumes candy and marshmallows when hungry and acknowledges need to reduce intake of sweets  Diabetes pharmacotherapy - Currently taking Ozempic  1 mg, Synjardy , and glipizide  - No gastrointestinal side effects with Ozempic   Peripheral neuropathy - Uses Lyrica  for neuropathy - Lyrica  is effective in managing neuropathic symptoms  Anxiety - Takes Lexapro  and Xanax  for anxiety - Doing well on current regimen  Hypertension - Blood pressure managed with lisinopril  - No issues with lisinopril           Relevant past medical, surgical, family and social history reviewed and updated as indicated. Interim medical history since our last visit reviewed. Allergies and medications reviewed and updated.  Review of Systems  Constitutional:  Negative for chills and fever.  HENT:  Negative for congestion, ear discharge and ear pain.   Eyes:  Negative for redness and visual disturbance.  Respiratory:  Negative for chest tightness and shortness of breath.    Cardiovascular:  Negative for chest pain and leg swelling.  Genitourinary:  Negative for difficulty urinating and dysuria.  Musculoskeletal:  Positive for arthralgias.  Skin:  Negative for rash.  Neurological:  Positive for numbness. Negative for dizziness, light-headedness and headaches.  Psychiatric/Behavioral:  Negative for agitation and behavioral problems.   All other systems reviewed and are negative.   Per HPI unless specifically indicated above   Allergies as of 04/13/2024       Reactions   Penicillins Other (See Comments)   Blisters in mouth Did it involve swelling of the face/tongue/throat, SOB, or low BP? No Did it involve sudden or severe rash/hives, skin peeling, or any reaction on the inside of your mouth or nose? No Did you need to seek medical attention at a hospital or doctor's office? No When did it last happen?      Childhood allergy If all above answers are NO, may proceed with cephalosporin use. Tolerated Cephalosporin Date: 08/04/22.   Codeine Nausea And Vomiting   Erythromycin Nausea And Vomiting   Fenofibrate Swelling   aching & edema    Lyrica  [pregabalin ] Swelling   Edema in feet   Statins Other (See Comments)   Joints ache   Sulfa  Antibiotics Nausea And Vomiting        Medication List        Accurate as of April 13, 2024  1:31 PM. If you have any questions, ask your nurse or doctor.  STOP taking these medications    Aspercreme Lidocaine  4 % Liqd Generic drug: Lidocaine  HCl Stopped by: Fonda Levins, MD   Ozempic  (1 MG/DOSE) 4 MG/3ML Sopn Generic drug: Semaglutide  (1 MG/DOSE) Replaced by: Semaglutide  (2 MG/DOSE) 8 MG/3ML Sopn Stopped by: Fonda Levins, MD       TAKE these medications    ALPRAZolam  0.5 MG tablet Commonly known as: XANAX  Take 1-2 tablets (0.5-1 mg total) by mouth daily as needed for anxiety. Take 1-2 tabs daily as needed   ARIPiprazole  5 MG tablet Commonly known as: ABILIFY  Take 1 tablet (5  mg total) by mouth daily.   atorvastatin  10 MG tablet Commonly known as: LIPITOR TAKE 1 TABLET (10 MG TOTAL) BY MOUTH EVERY MONDAY, WEDNESDAY, AND FRIDAY.   Australian Tea Tree 100 % Oil Apply 1 application  topically at bedtime as needed (foot pain).   blood glucose meter kit and supplies Kit Dispense based on patient and insurance preference. Use up to two times daily as directed. (Dx: type 2 DM - E11.9)   escitalopram  20 MG tablet Commonly known as: LEXAPRO  Take 1 tablet (20 mg total) by mouth daily.   ezetimibe  10 MG tablet Commonly known as: ZETIA  TAKE 1 TABLET BY MOUTH EVERY DAY   glipiZIDE  5 MG 24 hr tablet Commonly known as: GLUCOTROL  XL TAKE 1 TABLET BY MOUTH EVERY DAY WITH BREAKFAST   HYDROcodone -acetaminophen  10-325 MG tablet Commonly known as: NORCO Take 1 tablet by mouth every 6 (six) hours as needed for moderate pain (Right knee replaced).   lisinopril -hydrochlorothiazide  10-12.5 MG tablet Commonly known as: ZESTORETIC  Take 0.5 tablets by mouth daily.   meclizine  25 MG tablet Commonly known as: ANTIVERT  TAKE ONE TABLET THREE TIMES DAILY AS NEEDED FOR DIZZINESS Strength: 25 mg   nicotine  polacrilex 2 MG gum Commonly known as: NICORETTE Take 2 mg by mouth as needed for smoking cessation.   ONE TOUCH ULTRA 2 w/Device Kit CHECK BLOOD SUGAR UP TO TWICE DAILY Dx E11.9   OneTouch Delica Lancets 33G Misc CHECK BLOOD SUGAR UP TO TWICE DAILY Dx E11.9   OneTouch Ultra test strip Generic drug: glucose blood Check BS up to 2 times daily Dx 11.43   pantoprazole  40 MG tablet Commonly known as: PROTONIX  TAKE 1 TABLET BY MOUTH EVERY DAY   polyethylene glycol 17 g packet Commonly known as: MIRALAX  / GLYCOLAX  Take 17 g by mouth 2 (two) times daily as needed (constipation).   pregabalin  50 MG capsule Commonly known as: Lyrica  Take 1 capsule (50 mg total) by mouth 2 (two) times daily.   Premarin  vaginal cream Generic drug: conjugated estrogens  USE VAGINALLY AT  BEDTIME AS DIRECTED What changed: See the new instructions.   Semaglutide  (2 MG/DOSE) 8 MG/3ML Sopn Inject 2 mg as directed once a week. Replaces: Ozempic  (1 MG/DOSE) 4 MG/3ML Sopn Started by: Fonda Levins, MD   Synjardy  XR 25-1000 MG Tb24 Generic drug: Empagliflozin -metFORMIN  HCl ER Take 1 tablet by mouth daily.   Vitamin D  50 MCG (2000 UT) tablet Take 2,000 Units by mouth every morning.         Objective:   BP 138/84   Pulse 80   Ht 5' 6 (1.676 m)   Wt 202 lb (91.6 kg)   SpO2 95%   BMI 32.60 kg/m   Wt Readings from Last 3 Encounters:  04/13/24 202 lb (91.6 kg)  01/12/24 199 lb (90.3 kg)  10/11/23 198 lb (89.8 kg)    Physical Exam Physical Exam   VITALS: BP-  138/84 NECK: Thyroid  without nodules or enlargement. CHEST: Lungs clear to auscultation bilaterally. CARDIOVASCULAR: Heart regular rate and rhythm, no murmurs.         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 Neuropathy symptoms may worsen with high glucose levels. Discussed CGM but insurance limits due to no insulin  use. Emphasized dietary changes. - Increased Ozempic  to 2 mg. - Continue Synjardy  and glipizide . - Advised reducing breads, pastas, potatoes, and candy. - Sent refills for Lyrica .  Major depressive disorder, mild episode Stable on Lexapro  and Xanax . - Continue Lexapro  and Xanax . - Sent refills for Xanax .  Hypertension Blood pressure controlled at 138/84 with lisinopril . - Continue lisinopril .          Follow up plan: Return in about 3 months (around 07/12/2024), or if symptoms worsen or fail to improve, for diabetes.  Counseling provided for all of the vaccine components Orders Placed This Encounter  Procedures   Bayer DCA Hb A1c Waived    Fonda Levins, MD [facility] 04/13/2024, 1:31 PM     "

## 2024-04-14 ENCOUNTER — Telehealth: Payer: Self-pay

## 2024-04-14 NOTE — Telephone Encounter (Signed)
 Copied from CRM #[id number]. Topic: Clinical - Medication Question >> Apr 14, 2024 10:56 AM Mercer PEDLAR wrote: Reason for CRM: Patient stated that Dr. Maryanne changed the dosage of her Ozempic  from 1 MG to 2 MG. She stated that she has two boxes of the 1 MG and is asking if she can still take them using two instead of one. Patient is requesting a callback.

## 2024-04-14 NOTE — Telephone Encounter (Signed)
 Pt made aware that it is alright to use (2) of the 1mg  at a time. She has no further concerns.

## 2024-05-05 ENCOUNTER — Telehealth: Payer: Self-pay | Admitting: Family Medicine

## 2024-05-05 DIAGNOSIS — Z79899 Other long term (current) drug therapy: Secondary | ICD-10-CM

## 2024-05-05 DIAGNOSIS — F320 Major depressive disorder, single episode, mild: Secondary | ICD-10-CM

## 2024-05-05 NOTE — Telephone Encounter (Signed)
 Spoke with [facility]. Pt is able to get the Rx now. Looks like she may have just requested a few days too soon.

## 2024-05-05 NOTE — Telephone Encounter (Signed)
 Copied from CRM #[id number]. Topic: Clinical - Medication Refill >> May 05, 2024 11:46 AM Miquel SAILOR wrote: Medication: ALPRAZolam  (XANAX ) 0.5 MG tablet   Has the patient contacted their pharmacy? Yes (Agent: If no, request that the patient contact the pharmacy for the refill. If patient does not wish to contact the pharmacy document the reason why and proceed with request.) (Agent: If yes, when and what did the pharmacy advise?)  This is the patient's preferred pharmacy:  [id number] - [place], [place] - [address] [place] [address] Phone: [phone] Fax: [phone]  Is this the correct pharmacy for this prescription? Yes If no, delete pharmacy and type the correct one.   Has the prescription been filled recently? Yes  Is the patient out of the medication? No 3 pills left  Has the patient been seen for an appointment in the last year OR does the patient have an upcoming appointment? Yes  Can we respond through MyChart? Yes  Agent: Please be advised that Rx refills may take up to 3 business days. We ask that you follow-up with your pharmacy.

## 2024-07-13 ENCOUNTER — Ambulatory Visit: Admitting: Family Medicine
